# Patient Record
Sex: Female | Born: 1955 | Race: White | Hispanic: No | Marital: Married | Smoking: Former smoker
Health system: Southern US, Community
[De-identification: ages and names within clinical notes are randomized; demographics above are authoritative.]

## PROBLEM LIST (undated history)

## (undated) DIAGNOSIS — E669 Obesity, unspecified: Secondary | ICD-10-CM

## (undated) DIAGNOSIS — R519 Headache, unspecified: Secondary | ICD-10-CM

## (undated) DIAGNOSIS — Z9981 Dependence on supplemental oxygen: Secondary | ICD-10-CM

## (undated) DIAGNOSIS — F32A Depression, unspecified: Secondary | ICD-10-CM

## (undated) DIAGNOSIS — F329 Major depressive disorder, single episode, unspecified: Secondary | ICD-10-CM

## (undated) DIAGNOSIS — R51 Headache: Secondary | ICD-10-CM

## (undated) DIAGNOSIS — IMO0001 Reserved for inherently not codable concepts without codable children: Secondary | ICD-10-CM

## (undated) DIAGNOSIS — J449 Chronic obstructive pulmonary disease, unspecified: Secondary | ICD-10-CM

## (undated) DIAGNOSIS — K219 Gastro-esophageal reflux disease without esophagitis: Secondary | ICD-10-CM

## (undated) DIAGNOSIS — J45909 Unspecified asthma, uncomplicated: Secondary | ICD-10-CM

## (undated) DIAGNOSIS — F419 Anxiety disorder, unspecified: Secondary | ICD-10-CM

## (undated) DIAGNOSIS — G4730 Sleep apnea, unspecified: Secondary | ICD-10-CM

## (undated) DIAGNOSIS — G4700 Insomnia, unspecified: Secondary | ICD-10-CM

## (undated) HISTORY — DX: Insomnia, unspecified: G47.00

## (undated) HISTORY — PX: APPENDECTOMY: SHX54

## (undated) HISTORY — DX: Obesity, unspecified: E66.9

## (undated) HISTORY — PX: VAGINAL HYSTERECTOMY: SUR661

---

## 2006-08-01 ENCOUNTER — Ambulatory Visit: Payer: Self-pay | Admitting: Internal Medicine

## 2006-09-19 ENCOUNTER — Ambulatory Visit: Payer: Self-pay | Admitting: Internal Medicine

## 2006-09-20 ENCOUNTER — Ambulatory Visit: Payer: Self-pay | Admitting: Internal Medicine

## 2010-01-05 ENCOUNTER — Ambulatory Visit (HOSPITAL_COMMUNITY): Admission: RE | Admit: 2010-01-05 | Discharge: 2010-01-05 | Payer: Self-pay | Admitting: Surgery

## 2010-01-18 ENCOUNTER — Ambulatory Visit (HOSPITAL_COMMUNITY)
Admission: RE | Admit: 2010-01-18 | Discharge: 2010-01-18 | Payer: Self-pay | Source: Home / Self Care | Admitting: Surgery

## 2010-02-17 ENCOUNTER — Encounter: Admission: RE | Admit: 2010-02-17 | Discharge: 2010-02-17 | Payer: Self-pay | Admitting: Surgery

## 2010-03-02 ENCOUNTER — Ambulatory Visit (HOSPITAL_COMMUNITY): Admission: RE | Admit: 2010-03-02 | Discharge: 2010-03-02 | Payer: Self-pay | Admitting: Surgery

## 2011-03-25 NOTE — Assessment & Plan Note (Signed)
[facility]                               PULMONARY OFFICE NOTE   NAME:[name] [name], [name] [name]                     MRN:          [id number]  DATE:08/01/2006                            DOB:          [birth year]    REFERRING PHYSICIAN:  Raynelle Jan, M.D.   PULMONARY CONSULTATION:   CHIEF COMPLAINT:  Dyspnea.   HISTORY:  A 55 year old white female with onset 10 years ago of mild dyspnea  on exertion to the point where she can no longer walk on a flat surface for  more than 10 minutes or do light housework for no more than 5.  In addition  to the chronic complaints, she does have intermittent bronchitis typically  about every 3 months marked by increasing cough with thick white sputum  intermittently, and requires antibiotics for purulent sputum.  She sleeps on  2 pillows and is able to sleep through the night with no present  exacerbations with nocturnal wheezing or cough.  She does not remember  seeing any prednisone recently, but did receive Advair, which she says  didn't seem to help.   PAST MEDICAL HISTORY:  1. COPD.  2. Asthma.  3. Chronic headaches.  4. Sleep apnea.   ALLERGIES:  IBUPROFEN.   MEDICATIONS:  1. Hydrochlorothiazide.  2. Xopenex.  3. Spiriva.  4. Oxygen 2 liters per minute at bedtime.  (For full inventory, please see medication face sheet dated August 01, 2006).   SOCIAL HISTORY:  She continues to smoke after 35 years, up to 2-1/2 packs  per day.   FAMILY HISTORY:  Significant for emphysema in her mother.  Asthma also in  her mother.   REVIEW OF SYSTEMS:  Taken from the worksheet and significant for the  problems as outlined above.   PHYSICAL EXAMINATION:  GENERAL:  This is a slightly anxious and somewhat  depressed-appearing, ambulatory, obese white female, in no acute distress.  VITAL SIGNS:  Her weight is 245 pounds now, and states she has recently lost  50 pounds with no noticeable change in her complaints.   She has stable vital  signs.  HEENT:  Unremarkable.  Oropharynx is clear.  Nasal turbinates normal.  Ear  canals clear bilaterally.  NECK:  Supple without cervical adenopathy or tenderness.  Trachea is  midline.  LUNGS:  Lung fields reveal diminished breath sounds bilaterally.  No  wheezing.  HEART:  Regular rhythm without murmur, gallop, or rub.  S1 and S2 were  diminished.  ABDOMEN:  Massively obese but otherwise benign with no palpable  organomegaly, masses or tenderness, Hoover's sign was negative.  EXTREMITIES:  Warm without calf tenderness, cyanosis or clubbing.   CHEST X-RAY:  Chest x-ray today was normal.   IMPRESSION:  This patient predominantly appears to have morbid obesity with  a chronic obstructive pulmonary disease component that may be significant  but not truly limiting.  The only way to sort through it, given her body  habitus, is to proceed with a full set of PFT's, which I have asked her to  schedule.  In the meantime, I  note she does have Advair, and she is  beginning to have frequent exacerbations of cough or subjective wheezing.  I  think it is fine to start back on the Advair twice daily, but note  historically she did not do well with it.   At this point, I have also asked her to use the Xopenex q.4h. p.r.n. rather  than b.i.d., and Mucinex for cough.   I spent the most time talking to this patient about the natural history of  chronic obstructive pulmonary disease and the importance of smoking  cessation, rather than choice of doctors or inhalers, to stabilize her  airways disease.  I also emphasized a major challenge also faces her  regarding the issue of morbid obesity in terms of its impact on her overall  health in general, but in particular its effect on lung volume.                                   Charlaine Dalton. Sherene Sires, MD, [facility]   MBW/MedQ  DD:  08/01/2006  DT:  08/03/2006  Job #:  [id number]   cc:   Raynelle Jan, M.D.

## 2011-03-25 NOTE — Assessment & Plan Note (Signed)
[facility]                               PULMONARY OFFICE NOTE   NAME:[name], [name] KIMBELL                     MRN:          [id number]  DATE:09/20/2006                            DOB:          07/29/1956    PULMONARY/EXTENDED FOLLOW-UP OFFICE VISIT:   HISTORY:  A 55 year old white female, active smoking, with morbid obesity,  albeit with some progress in terms of weight control over the last several  years.  Still actively smoking and complaining of dyspnea if she mops more  than a few minutes.  She did report transient improvement after albuterol  but only for a few minutes, and we did document that after albuterol, her  PFTs are essentially normal.  She comes in discouraged today that she is no  better, mainly complaining of dyspnea with minimal activity, as noted  above, plus frequent flare-ups of what she describes as bronchitis.   Her medications at present are confusing, as that she was not forthcoming  about exactly what she takes.   PHYSICAL EXAMINATION:  GENERAL:  She is a depressed-appearing ambulatory  white female who clears her throat frequently during the exam.  NECK:  Supple without cervical adenopathy, tenderness.  Trachea is midline.  No thyromegaly.  LUNGS:  Perfectly clear bilaterally to auscultation and percussion.  Breath  sounds were slightly diminished.  HEART:  Regular rhythm without murmur, rub or gallop.  ABDOMEN:  Obese, benign.  EXTREMITIES:  Warm without calf tenderness, clubbing, cyanosis or edema.   Heme saturation 96% on room air.   IMPRESSION:  1. No evidence of chronic obstructive pulmonary disease.  After      bronchodilators, her FEV1 was 84% with a ratio of 78%.  This begs the      question, therefore, why she is so short of breath after she uses her      Xopenex inhaler, and I believe this is related to obesity and      deconditioning.  2. Frequent throat clearing, probably represents reflux and may  mimic      asthma, which she has, and chronic obstructive pulmonary disease, which      she does have.  To try to sort through the differential, I recommended      adding omeprazole each and every morning before breakfast and asking      her empirically to stop Spiriva and Advair, simply use Xopenex on a      p.r.n. basis (I documented that she could use this effectively).  Most      of the visit, however, was spent discussing the issue of smoking      cessation.  Without this, I believe she will have a progressive      increase in frequency of exacerbations and probably a decline in her      best FEV1 to the point where she actually does meet the criteria for      chronic obstructive pulmonary disease, which does not apply here.  I      tried to explain this all to her in a very straightforward, unambiguous  fashion. I am not sure if she really got the message though, and do not      plan to see her back here for the purpose of smoking cessation but      deferred this back to Dr. Joetta Manners.  I did emphasize that the best      way to do this is for her to set a quit day and then ask for help from      either Dr. Karl Bales office or consider being referred to our [facility] [employer]      program here (a behavioral psychology program, not part of the      pulmonary division).   There is data that would suggest patients with significant COPD with  frequent exacerbations benefit from Advair 250/50 b.i.d. on a maintenance  basis.  The patient told me today that she had not used Advair this way,  preferring a p.r.n.  Xopenex would be a better fit for her; however, if she  is having frequent exacerbations that would be worth trying, Advair with the  stipulation that it may not be very effective if she is actively smoking.     [name] [name]. [name] Sires, MD, [facility]  Electronically Signed    MBW/MedQ  DD: 09/20/2006  DT: 09/20/2006  Job #: [id number]   cc:   Raynelle Jan, M.D.

## 2012-02-01 ENCOUNTER — Emergency Department (HOSPITAL_COMMUNITY): Payer: Medicare Other

## 2012-02-01 ENCOUNTER — Other Ambulatory Visit: Payer: Self-pay

## 2012-02-01 ENCOUNTER — Encounter (HOSPITAL_COMMUNITY): Payer: Self-pay | Admitting: *Deleted

## 2012-02-01 ENCOUNTER — Inpatient Hospital Stay (HOSPITAL_COMMUNITY)
Admission: EM | Admit: 2012-02-01 | Discharge: 2012-02-05 | DRG: 190 | Disposition: A | Discharge status: Home / Self Care | Payer: Medicare Other | Attending: Internal Medicine | Admitting: Internal Medicine

## 2012-02-01 DIAGNOSIS — Z720 Tobacco use: Secondary | ICD-10-CM | POA: Diagnosis present

## 2012-02-01 DIAGNOSIS — Z79899 Other long term (current) drug therapy: Secondary | ICD-10-CM

## 2012-02-01 DIAGNOSIS — J441 Chronic obstructive pulmonary disease with (acute) exacerbation: Principal | ICD-10-CM | POA: Diagnosis present

## 2012-02-01 DIAGNOSIS — F17200 Nicotine dependence, unspecified, uncomplicated: Secondary | ICD-10-CM | POA: Diagnosis present

## 2012-02-01 DIAGNOSIS — J189 Pneumonia, unspecified organism: Secondary | ICD-10-CM | POA: Diagnosis present

## 2012-02-01 DIAGNOSIS — Z888 Allergy status to other drugs, medicaments and biological substances status: Secondary | ICD-10-CM

## 2012-02-01 DIAGNOSIS — J40 Bronchitis, not specified as acute or chronic: Secondary | ICD-10-CM | POA: Insufficient documentation

## 2012-02-01 DIAGNOSIS — Z23 Encounter for immunization: Secondary | ICD-10-CM

## 2012-02-01 DIAGNOSIS — K219 Gastro-esophageal reflux disease without esophagitis: Secondary | ICD-10-CM | POA: Diagnosis present

## 2012-02-01 DIAGNOSIS — IMO0002 Reserved for concepts with insufficient information to code with codable children: Secondary | ICD-10-CM

## 2012-02-01 DIAGNOSIS — Z9981 Dependence on supplemental oxygen: Secondary | ICD-10-CM

## 2012-02-01 HISTORY — DX: Gastro-esophageal reflux disease without esophagitis: K21.9

## 2012-02-01 HISTORY — DX: Chronic obstructive pulmonary disease, unspecified: J44.9

## 2012-02-01 LAB — BASIC METABOLIC PANEL
BUN: 8 mg/dL (ref 6–23)
Potassium: 3.5 mEq/L (ref 3.5–5.1)

## 2012-02-01 LAB — HEPATIC FUNCTION PANEL
AST: 11 U/L (ref 0–37)
Albumin: 3.6 g/dL (ref 3.5–5.2)
Total Protein: 7.5 g/dL (ref 6.0–8.3)

## 2012-02-01 LAB — DIFFERENTIAL
Basophils Relative: 0 % (ref 0–1)
Lymphocytes Relative: 13 % (ref 12–46)
Monocytes Absolute: 1 10*3/uL (ref 0.1–1.0)
Monocytes Relative: 6 % (ref 3–12)
Neutrophils Relative %: 81 % — ABNORMAL HIGH (ref 43–77)

## 2012-02-01 LAB — POCT I-STAT TROPONIN I

## 2012-02-01 LAB — CBC
HCT: 45.4 % (ref 36.0–46.0)
Hemoglobin: 15.5 g/dL — ABNORMAL HIGH (ref 12.0–15.0)
MCH: 29 pg (ref 26.0–34.0)
MCHC: 34.1 g/dL (ref 30.0–36.0)
Platelets: 169 10*3/uL (ref 150–400)
RBC: 5.34 MIL/uL — ABNORMAL HIGH (ref 3.87–5.11)
RDW: 14.6 % (ref 11.5–15.5)

## 2012-02-01 LAB — TSH: TSH: 1.314 u[IU]/mL (ref 0.350–4.500)

## 2012-02-01 LAB — EXPECTORATED SPUTUM ASSESSMENT W GRAM STAIN, RFLX TO RESP C

## 2012-02-01 MED ORDER — ALBUTEROL SULFATE (5 MG/ML) 0.5% IN NEBU
2.5000 mg | INHALATION_SOLUTION | RESPIRATORY_TRACT | Status: DC
  Administered 2012-02-01 – 2012-02-04 (×18): 2.5 mg via RESPIRATORY_TRACT
  Filled 2012-02-01 (×19): qty 0.5

## 2012-02-01 MED ORDER — ENOXAPARIN SODIUM 40 MG/0.4ML ~~LOC~~ SOLN
40.0000 mg | SUBCUTANEOUS | Status: DC
  Administered 2012-02-01 – 2012-02-04 (×4): 40 mg via SUBCUTANEOUS
  Filled 2012-02-01 (×5): qty 0.4

## 2012-02-01 MED ORDER — ONDANSETRON HCL 4 MG/2ML IJ SOLN
4.0000 mg | Freq: Four times a day (QID) | INTRAMUSCULAR | Status: DC | PRN
  Administered 2012-02-02: 4 mg via INTRAVENOUS
  Filled 2012-02-01: qty 2

## 2012-02-01 MED ORDER — DEXTROSE 5 % IV SOLN
1.0000 g | INTRAVENOUS | Status: DC
  Administered 2012-02-02 – 2012-02-05 (×4): 1 g via INTRAVENOUS
  Filled 2012-02-01 (×4): qty 10

## 2012-02-01 MED ORDER — SODIUM CHLORIDE 0.9 % IV SOLN
INTRAVENOUS | Status: DC
  Administered 2012-02-01 – 2012-02-02 (×2): via INTRAVENOUS

## 2012-02-01 MED ORDER — INFLUENZA VIRUS VACC SPLIT PF IM SUSP
0.5000 mL | INTRAMUSCULAR | Status: AC
  Administered 2012-02-02: 0.5 mL via INTRAMUSCULAR
  Filled 2012-02-01: qty 0.5

## 2012-02-01 MED ORDER — ONDANSETRON HCL 4 MG/2ML IJ SOLN
4.0000 mg | Freq: Once | INTRAMUSCULAR | Status: AC
  Administered 2012-02-01: 4 mg via INTRAVENOUS
  Filled 2012-02-01: qty 2

## 2012-02-01 MED ORDER — HYDROCOD POLST-CHLORPHEN POLST 10-8 MG/5ML PO LQCR
5.0000 mL | Freq: Two times a day (BID) | ORAL | Status: DC
  Administered 2012-02-01 – 2012-02-05 (×8): 5 mL via ORAL
  Filled 2012-02-01 (×8): qty 5

## 2012-02-01 MED ORDER — METHYLPREDNISOLONE SODIUM SUCC 40 MG IJ SOLR
40.0000 mg | Freq: Two times a day (BID) | INTRAMUSCULAR | Status: DC
  Administered 2012-02-01 – 2012-02-05 (×8): 40 mg via INTRAVENOUS
  Filled 2012-02-01 (×9): qty 1

## 2012-02-01 MED ORDER — ZOLPIDEM TARTRATE 5 MG PO TABS
5.0000 mg | ORAL_TABLET | Freq: Every evening | ORAL | Status: DC | PRN
  Administered 2012-02-02 – 2012-02-04 (×3): 5 mg via ORAL
  Filled 2012-02-01 (×3): qty 1

## 2012-02-01 MED ORDER — IPRATROPIUM BROMIDE 0.02 % IN SOLN
0.5000 mg | RESPIRATORY_TRACT | Status: DC
  Administered 2012-02-01 – 2012-02-04 (×18): 0.5 mg via RESPIRATORY_TRACT
  Filled 2012-02-01 (×19): qty 2.5

## 2012-02-01 MED ORDER — ACETAMINOPHEN 325 MG PO TABS: 650.0000 mg | ORAL_TABLET | Freq: Four times a day (QID) | ORAL | Status: DC | PRN

## 2012-02-01 MED ORDER — DEXTROSE 5 % IV SOLN
500.0000 mg | INTRAVENOUS | Status: DC
  Administered 2012-02-02 – 2012-02-04 (×3): 500 mg via INTRAVENOUS
  Filled 2012-02-01 (×3): qty 500

## 2012-02-01 MED ORDER — DEXTROSE 5 % IV SOLN
1.0000 g | Freq: Once | INTRAVENOUS | Status: AC
  Administered 2012-02-01: 1 g via INTRAVENOUS
  Filled 2012-02-01: qty 10

## 2012-02-01 MED ORDER — PNEUMOCOCCAL VAC POLYVALENT 25 MCG/0.5ML IJ INJ
0.5000 mL | INJECTION | INTRAMUSCULAR | Status: AC
  Administered 2012-02-02: 0.5 mL via INTRAMUSCULAR
  Filled 2012-02-01: qty 0.5

## 2012-02-01 MED ORDER — IPRATROPIUM BROMIDE 0.02 % IN SOLN
0.5000 mg | Freq: Once | RESPIRATORY_TRACT | Status: AC
  Administered 2012-02-01: 0.5 mg via RESPIRATORY_TRACT
  Filled 2012-02-01: qty 2.5

## 2012-02-01 MED ORDER — PREDNISONE 20 MG PO TABS
60.0000 mg | ORAL_TABLET | Freq: Once | ORAL | Status: AC
  Administered 2012-02-01: 60 mg via ORAL
  Filled 2012-02-01: qty 3

## 2012-02-01 MED ORDER — PANTOPRAZOLE SODIUM 40 MG PO TBEC
40.0000 mg | DELAYED_RELEASE_TABLET | Freq: Every day | ORAL | Status: DC
  Administered 2012-02-01 – 2012-02-05 (×5): 40 mg via ORAL
  Filled 2012-02-01 (×4): qty 1

## 2012-02-01 MED ORDER — ONDANSETRON HCL 4 MG PO TABS
4.0000 mg | ORAL_TABLET | Freq: Four times a day (QID) | ORAL | Status: DC | PRN
  Administered 2012-02-03: 4 mg via ORAL
  Filled 2012-02-01: qty 1

## 2012-02-01 MED ORDER — DEXTROSE 5 % IV SOLN
500.0000 mg | Freq: Once | INTRAVENOUS | Status: AC
  Administered 2012-02-01: 500 mg via INTRAVENOUS
  Filled 2012-02-01: qty 500

## 2012-02-01 MED ORDER — NICOTINE 21 MG/24HR TD PT24
21.0000 mg | MEDICATED_PATCH | Freq: Every day | TRANSDERMAL | Status: DC
  Administered 2012-02-01 – 2012-02-05 (×5): 21 mg via TRANSDERMAL
  Filled 2012-02-01 (×5): qty 1

## 2012-02-01 MED ORDER — ACETAMINOPHEN 650 MG RE SUPP: 650.0000 mg | Freq: Four times a day (QID) | RECTAL | Status: DC | PRN

## 2012-02-01 MED ORDER — ALBUTEROL SULFATE (5 MG/ML) 0.5% IN NEBU
5.0000 mg | INHALATION_SOLUTION | Freq: Once | RESPIRATORY_TRACT | Status: AC
  Administered 2012-02-01: 5 mg via RESPIRATORY_TRACT
  Filled 2012-02-01: qty 1

## 2012-02-01 NOTE — ED Notes (Signed)
Called floor with report Nurse assisting another patient will call back.

## 2012-02-01 NOTE — H&P (Signed)
Internal Medicine Attending Admission Note Date: 02/01/2012  Patient name: [name] [name] Medical record number: [id number] Date of birth: 1956-07-20 Age: 56 y.o. Gender: female  I saw and evaluated the patient. I reviewed the resident's note and I agree with the resident's findings and plan as documented in the resident's note.  Chief Complaint(s):  Shortness of breath and cough productive of yellow green sputum for 3 weeks.  History - key components related to admission:  [name] [name] is a 37 her old woman with a history of COPD and depression who presents with a three-week history of productive cough and progressive shortness of breath. She was diagnosed with COPD approximately 15 years ago. She has been managed with an as needed albuterol nebulizer. Her nebulizer recently broke and could not get it fixed or replaced. She recently moved to [place] and did not have a primary care provider. She therefore went to an urgent care center and was given an albuterol treatment, Advair to use when necessary, and prednisone burst with taper with an antibiotic. Her symptoms persisted and she returned to the urgent care Center and another course of prednisone burst with taper and antibiotics was given. Her symptoms failed to improve and over the last week she noted some blood specks in her sputum. She therefore decided to come to the [facility] emergency department for further evaluation. She denies any fevers but does admit to some recent shaking chills. These are associated with hot flashes. She denies chest pain but admits to 2 recent sick contacts in the last several weeks. While in the emergency department she was given an albuterol nebulizer treatment with marked improvement in her symptoms. Her only other complaint is right flank and back pain associated with her cough.  Physical Exam - key components related to admission:  Filed Vitals:   02/01/12 1415 02/01/12 1516 02/01/12 1519 02/01/12 1731    BP: 110/72 164/85    Pulse: 97 96    Temp:  98 F (36.7 C)    TempSrc:      Resp: 22 22    Height:  5\' 8"  (1.727 m)    Weight:  164 lb 1.6 oz (74.435 kg)    SpO2: 94% 94% 92% 92%   General: Well-developed, well-nourished, woman sitting comfortably in a chair in no acute distress. She is able to speak in full sentences without respiratory distress. Lungs: Diffuse expiratory wheezing with a markedly prolonged expiratory phase. There are no rales or crackles. Heart: Difficult to hear heart sounds secondary to her body habitus and wheezing. I believe she has a regular rate and rhythm without murmurs, rubs, or gallops appreciated. Abdomen: Soft, non tender, active bowel sounds. Extremities: Without edema.  Lab results:  Basic Metabolic Panel:  Basename 02/01/12 1600 02/01/12 1100  NA -- 135  K -- 3.5  CL -- 100  CO2 -- 23  GLUCOSE -- 103*  BUN -- 8  CREATININE -- 0.60  CALCIUM -- 9.2  MG 2.3 --  PHOS -- --   Liver Function Tests:  Basename 02/01/12 1600  AST 11  ALT 9  ALKPHOS 115  BILITOT 0.7  PROT 7.5  ALBUMIN 3.6   CBC:  Basename 02/01/12 1100  WBC 17.0*  NEUTROABS 13.8*  HGB 15.5*  HCT 45.4  MCV 85.0  PLT 169   Imaging results:  Dg Chest 2 View  02/01/2012  *RADIOLOGY REPORT*  Clinical Data: Shortness of breath, cough, right chest pain  CHEST - 2 VIEW  Comparison: None.  Findings: Cardiomediastinal silhouette is unremarkable.  No pulmonary edema.  There is hazy airspace disease in the right lower lobe with subtle increased bronchial markings.  This  is suspicious for early infiltrate/pneumonia.  Follow-up to resolution is recommended after treatment.  IMPRESSION:  No pulmonary edema.  There is hazy airspace disease in the right lower lobe with subtle increased bronchial markings.  This  is suspicious for early infiltrate/pneumonia.  Follow-up to resolution is recommended after treatment.  Original Report Authenticated By: Natasha Mead, M.D.   Assessment & Plan by  Problem:  [name] [name] is a 56 year old woman with a history of COPD who presents with progressive dyspnea over the last 3 weeks and productive cough which has not responded to prednisone and antibiotics. The symptoms began after exposure to sick contacts and were exacerbated by her lack of access to a short acting bronchodilators. When she receives her bronchodilators, her symptoms are quickly improved. I suspect she has a right basilar pneumonia that resulted in some mild blood flecks in her sputum and a COPD exacerbation.  1) COPD exacerbation: We will treat her possible right basilar pneumonia with IV antibiotics to cover a community acquired bacteria. We will also provide her with bronchodilators including albuterol and ipratropium by nebulization. She will be treated with a Solu-Medrol burst and converted to oral prednisone with a 2 week taper once feeling better. I anticipate she will be ready for discharge in the next 48-72 hours. Since she is new to [facility] she will require a primary care provider and we will set her up in the Internal Medicine Center.

## 2012-02-01 NOTE — ED Notes (Signed)
Patient transported to X-ray 

## 2012-02-01 NOTE — ED Provider Notes (Signed)
Medical screening examination/treatment/procedure(s) were performed by non-physician practitioner and as supervising physician I was immediately available for consultation/collaboration.   Chesnee Floren M Hubbard Seldon, DO 02/01/12 2155 

## 2012-02-01 NOTE — ED Notes (Signed)
Admitting MD at bedside.

## 2012-02-01 NOTE — ED Provider Notes (Signed)
History     CSN: [id number]  Arrival date & time 02/01/12  1001   First MD Initiated Contact with Patient 02/01/12 1013      Chief Complaint  Patient presents with  . Shortness of Breath    (Consider location/radiation/quality/duration/timing/severity/associated sxs/prior treatment) HPI History provided by pt.   Pt has had persistent cough productive of yellow-green sputum as well as SOB that is aggravated by coughing and exertion x 3 weeks.  Is now seeing streaks of blood in sputum and cough seems to be worse.  Denies CP but has bilateral, lateral lower rib pain.  No known fever.  H/o chronic bronchitis for which is on pm home O2.  Continues to smoke.  No known CAD.  No RF for PE and denies LE pain/edema.  Was seen at an urgent care at onset of sx, diagnosed w/ bronchitis and d/c'd home w/ prednisone and zpak.  Was compliant w/ medications but sx did not improve.  No relief w/ atrovent or albuterol inhaler either.    Past Medical History  Diagnosis Date  . COPD (chronic obstructive pulmonary disease)   . Asthma   . Bronchitis     Past Surgical History  Procedure Date  . Abdominal hysterectomy   . Appendectomy     No family history on file.  History  Substance Use Topics  . Smoking status: Current Everyday Smoker    Last Attempt to Quit: 01/11/2012  . Smokeless tobacco: Not on file  . Alcohol Use: No    OB History    Grav Para Term Preterm Abortions TAB SAB Ect Mult Living                  Review of Systems  All other systems reviewed and are negative.    Allergies  Ibuprofen  Home Medications   Current Outpatient Rx  Name Route Sig Dispense Refill  . FLUTICASONE-SALMETEROL 250-50 MCG/DOSE IN AEPB Inhalation Inhale 1-4 puffs into the lungs 2 (two) times daily as needed. For shortness of breath      BP 124/82  Pulse 109  Temp(Src) 98.5 F (36.9 C) (Oral)  Resp 24  SpO2 92%  Physical Exam  Nursing note and vitals reviewed. Constitutional: She is  oriented to person, place, and time. She appears well-developed and well-nourished. No distress.       Morbidly obese  HENT:  Head: Normocephalic and atraumatic.  Eyes:       Normal appearance  Neck: Normal range of motion.  Cardiovascular: Regular rhythm.        Mild tachycardia at 106bmp  Pulmonary/Chest: Effort normal and breath sounds normal. She has no rales.       Mild resp distress but able to complete sentences. O2 92-94% rm air. Productive coughing.  Diffuse expiratory wheezing.  Mild tenderness of right lateral lower rib cage.   Musculoskeletal: Normal range of motion.       No peripheral edema or calf ttp  Neurological: She is alert and oriented to person, place, and time.  Skin: Skin is warm and dry. No rash noted.  Psychiatric: She has a normal mood and affect. Her behavior is normal.    ED Course  Procedures (including critical care time)   Date: 02/01/2012  Rate: 103  Rhythm: sinus tachycardia  QRS Axis: normal  Intervals: normal  ST/T Wave abnormalities: normal  Conduction Disutrbances:none  Narrative Interpretation:   Old EKG Reviewed: unchanged   Labs Reviewed  CBC - Abnormal; Notable for the  following:    WBC 17.0 (*)    RBC 5.34 (*)    Hemoglobin 15.5 (*)    All other components within normal limits  DIFFERENTIAL - Abnormal; Notable for the following:    Neutrophils Relative 81 (*)    Neutro Abs 13.8 (*)    All other components within normal limits  BASIC METABOLIC PANEL - Abnormal; Notable for the following:    Glucose, Bld 103 (*)    All other components within normal limits  POCT I-STAT TROPONIN I  HEPATIC FUNCTION PANEL  MAGNESIUM  STREP PNEUMONIAE URINARY ANTIGEN  TSH  CULTURE, SPUTUM-ASSESSMENT  LEGIONELLA ANTIGEN, URINE  BASIC METABOLIC PANEL  CBC   Dg Chest 2 View  02/01/2012  *RADIOLOGY REPORT*  Clinical Data: Shortness of breath, cough, right chest pain  CHEST - 2 VIEW  Comparison: None.  Findings: Cardiomediastinal silhouette is  unremarkable.  No pulmonary edema.  There is hazy airspace disease in the right lower lobe with subtle increased bronchial markings.  This  is suspicious for early infiltrate/pneumonia.  Follow-up to resolution is recommended after treatment.  IMPRESSION:  No pulmonary edema.  There is hazy airspace disease in the right lower lobe with subtle increased bronchial markings.  This  is suspicious for early infiltrate/pneumonia.  Follow-up to resolution is recommended after treatment.  Original Report Authenticated By: Natasha Mead, M.D.     1. COPD exacerbation   2. Community acquired pneumonia       MDM  56yo F w/ h/o chronic bronchitis presents w/ 3wks productive cough and SOB that is gradually worsening.  On exam, afebrile, mild resp distress, mild hypoxia/tacycardia, coughing, diffuse exp wheezing, no LE pain/edema.  CXR pending.  Labs ordered as well because suspect admission based on initial exam.  Pt to received breathing tmnt and prednisone.  Will reasses shortly.   Labs sig for leukocytosis and neg troponin.  CXR shows hazy airspace dz right lower lobe, suspicious for early infiltrate.  Results discussed w/ pt.  She reports feeling better following breathing tmnt but continues to be tachpneic at 30-34 breaths/min and have diffuse expiratory wheezing.  Will admit for COPD exacerbation + CAP.  IV zithromax and rocephin have been ordered.    Outpatient Clinics consulted for admission.          Arie Sabina Dugger, [place] 02/01/12 2138

## 2012-02-01 NOTE — ED Notes (Signed)
Pt is here with shortness of breath for 3 weeks and has had pred pack and states this am started coughing up blood.  PT sts it is green and yellow sputum.  PT reports cold chills and sweating

## 2012-02-01 NOTE — ED Notes (Addendum)
C/o cold, prod cough, SOB with exertion x 3 weeks. Dx with bronchitis 1 week ago, just finished antibiotic & prednisone. Reports not getting any better. Continues with prod cough. Denies fever but does report hot & cold chills.   Reports this am noticed for first time specks of blood in sputum. C/o pain to bilateral rib area

## 2012-02-01 NOTE — H&P (Signed)
Hospital Admission Note Date: 02/01/2012  Patient name:  [name] [name]   Medical record number:  [id number] Date of birth:  12-Jul-1956  Age: 56 y.o. Gender:  female PCP:    No primary provider on file.  Medical Service:   Internal Medicine Teaching Service   Attending physician:  Dr.Klima First Contact:   Dr. Dierdre Searles      Pager: [phone] Second Contact:   Dr. Allena Katz           Pager: [phone] After Hours:    First Contact   Pager: [phone]      Second Contact  Pager: [phone]   Chief Complaint: Shortness of breath  History of Present Illness: Patient is a 56 y.o. female with a PMHx of COPD, who presents to [facility] for evaluation of shortness of breath been going on for past 3 weeks. Symptoms started with a dry cough. Her husband had the same symptoms at that time. It worsened progressively and become productive with brownish/green sputum. She started having shortness of breath throughout the day more so with mild exertion. She does not have any chest pain but does have back pain especially with coughing. She did not have any fever but had chills and night sweats since the onset of symptoms. No leg edema. In past couple of days she started seeing some streaks of blood in her sputum. She went to urgent care center but she was prescribed Z-Pak and 2 weeks of prednisone which did not help her symptoms. She had similar symptoms one year ago but not this severe. No other complaints like rash, lightheadedness, travel, change in medicines etc. she uses Advair for her COPD and does what she continue to use since the onset of symptoms. She does not use albuterol or any other inhalers. She is to have a nebulizer machine before but has not used it in 6 months.  Current Outpatient Medications: Advair  Allergies: Allergies  Allergen Reactions  . Ibuprofen Hives and Swelling     Past Medical History: Past Medical History  Diagnosis Date  . COPD (chronic obstructive pulmonary disease)     last exacerbation 1  year ago, treated as outpatient    Past Surgical History: Past Surgical History  Procedure Date  . Abdominal hysterectomy   . Appendectomy     Family History: Family History  Problem Relation Age of Onset  . Breast cancer Mother   . Breast cancer Maternal Grandmother     Social History: History   Social History  . Marital Status: Married    Spouse Name: N/A    Number of Children: N/A  . Years of Education: N/A   Occupational History  . Not on file.   Social History Main Topics  . Smoking status: Current Everyday Smoker    Last Attempt to Quit: 01/11/2012  . Smokeless tobacco: Not on file  . Alcohol Use: No  . Drug Use: No  . Sexually Active:    Other Topics Concern  . Not on file   Social History Narrative   lives in [place] with her husband. Has one son who is 46 years old. Used to work in [employer], on disability now. Has Medicare. Smokes 2 packs per day for past 40 years. Has not had a cigarette last 3 weeks since she got sick. Never drank alcohol. Never did  drugs.    Review of Systems: Constitutional:  Denies fever, chills, diaphoresis, appetite change and fatigue.  HEENT: Denies photophobia, eye pain, redness, hearing  loss, ear pain, congestion, sore throat, rhinorrhea, sneezing, mouth sores, trouble swallowing, neck pain, neck stiffness and tinnitus.  Respiratory:  as per history of present illness   Cardiovascular: Denies chest pain, palpitations and leg swelling.  Gastrointestinal: Denies nausea, vomiting, abdominal pain, diarrhea, constipation, blood in stool and abdominal distention.  Genitourinary: Denies dysuria, urgency, frequency, hematuria, flank pain and difficulty urinating.  Musculoskeletal: Denies myalgias, back pain, joint swelling, arthralgias and gait problem.   Skin: Denies pallor, rash and wound.  Neurological: Denies dizziness, seizures, syncope, weakness, light-headedness, numbness and headaches.   Hematological: Denies  adenopathy. Easy bruising, personal or family bleeding history.  Psychiatric/ Behavioral: Denies suicidal ideation, mood changes, confusion, nervousness, sleep disturbance and agitation.    Vital Signs:  Filed Vitals:   02/01/12 1333  BP: 120/78  Pulse: 97  Temp:  98.9   O2 saturation   97% on room   Resp: 28     Physical Exam: General:  obese, sitting up in bed , mild to moderate respiratory distress , able to speak in full sentences with occasional coughing   Head: Normocephalic, atraumatic.  Eyes: PERRL, EOMI, No signs of anemia or jaundince.  Nose: Mucous membranes  dry, not inflammed, nonerythematous.  Throat: Oropharynx nonerythematous, no exudate appreciated.   Neck: No deformities, masses, or tenderness noted.Supple, No carotid Bruits, no JVD.  Lungs:  Normal respiratory effort.  bilateral diffuse wheezes no crackles .  Heart: RRR. S1 and S2 normal without gallop, murmur, or rubs.  Abdomen:  BS normoactive. Soft, Nondistended, non-tender.  No masses or organomegaly.  Extremities: No pretibial edema.  Neurologic: A&O X3, CN II - XII are grossly intact. Motor strength is 5/5 in the all 4 extremities, Sensations intact to light touch, Cerebellar signs negative.  Skin: No visible rashes, scars.   Lab results: Basic Metabolic Panel: Recent Labs  Basename 02/01/12 1100   NA 135   K 3.5   CL 100   CO2 23   GLUCOSE 103*   BUN 8   CREATININE 0.60   CALCIUM 9.2   MG --   PHOS --   CBC: Recent Labs  Basename 02/01/12 1100   WBC 17.0*   NEUTROABS 13.8*   HGB 15.5*   HCT 45.4   MCV 85.0   PLT 169    Imaging results:  Dg Chest 2 View  02/01/2012  *RADIOLOGY REPORT*  Clinical Data: Shortness of breath, cough, right chest pain  CHEST - 2 VIEW  Comparison: None.  Findings: Cardiomediastinal silhouette is unremarkable.  No pulmonary edema.  There is hazy airspace disease in the right lower lobe with subtle increased bronchial markings.  This  is suspicious for early  infiltrate/pneumonia.  Follow-up to resolution is recommended after treatment.  IMPRESSION:  No pulmonary edema.  There is hazy airspace disease in the right lower lobe with subtle increased bronchial markings.  This  is suspicious for early infiltrate/pneumonia.  Follow-up to resolution is recommended after treatment.  Original Report Authenticated By: Natasha Mead, M.D.     Other results: EKG: Normal sinus rhythm, normal axis, Mild left atrial enlargement, nonspecific ST-T wave changes in anterior leads    Assessment & Plan: 56 year old woman with past medical history of COPD comes in to the emergency department with shortness of breath for past 3 weeks the diffuse wheezing on physical exam. She is clinically stable and her vitals look good but she is in respiratory distress. Chest x-ray shows possible pneumonia she has elevated white count but she  has been on prednisone for past 2 weeks    #1 shortness of breath - Most likely COPD exacerbation. Other factors that may be contributing to her shortness of breath are right-sided heart failure, pulmonary hypertension, obstructive sleep apnea and pneumonia - Given the history ,physical exam, lab values and x-ray findings we'll treat her with COPD exacerbation  Plan - Admit to MedSurg unit - Solu-Medrol 40 mg IV twice a day - Rocephin and Zithromax IV - Albuterol/ipratropium nebulizer every 4 hours - Normal saline at 100 cc per hour - Check ABG - Oxygen supplementation for situation more than 88% - Repeat chest x-ray for improvement by tomorrow morning  #2 smoking - Cessation counseling - Nicotine patches  #3 DVT PPX -Lovenox     Bethel Born, M.D. (PGY3):  ____________________________________    Date/ Time:     ____________________________________

## 2012-02-01 NOTE — ED Notes (Signed)
Informed patient and/or family of status.  

## 2012-02-02 LAB — BASIC METABOLIC PANEL
BUN: 11 mg/dL (ref 6–23)
Chloride: 102 mEq/L (ref 96–112)
GFR calc Af Amer: >90 mL/min (ref >90)
Glucose, Bld: 154 mg/dL — ABNORMAL HIGH (ref 70–99)
Potassium: 3.8 mEq/L (ref 3.5–5.1)

## 2012-02-02 LAB — CBC
HCT: 40.5 % (ref 36.0–46.0)
Hemoglobin: 13.3 g/dL (ref 12.0–15.0)
MCH: 28.2 pg (ref 26.0–34.0)
MCHC: 32.8 g/dL (ref 30.0–36.0)
RBC: 4.71 MIL/uL (ref 3.87–5.11)

## 2012-02-02 MED ORDER — ALBUTEROL SULFATE (5 MG/ML) 0.5% IN NEBU: 2.5000 mg | INHALATION_SOLUTION | RESPIRATORY_TRACT | Status: DC

## 2012-02-02 MED ORDER — GUAIFENESIN-DM 100-10 MG/5ML PO SYRP
5.0000 mL | ORAL_SOLUTION | ORAL | Status: DC | PRN
  Administered 2012-02-03: 5 mL via ORAL
  Filled 2012-02-02: qty 5

## 2012-02-02 MED ORDER — TRAMADOL HCL 50 MG PO TABS
50.0000 mg | ORAL_TABLET | Freq: Four times a day (QID) | ORAL | Status: DC | PRN
  Filled 2012-02-02: qty 1

## 2012-02-02 MED ORDER — IPRATROPIUM BROMIDE 0.02 % IN SOLN: 0.5000 mg | RESPIRATORY_TRACT | Status: DC | PRN

## 2012-02-02 NOTE — Progress Notes (Signed)
Internal Medicine Attending  Date: 02/02/2012  Patient name: [name] [name] Medical record number: [id number] Date of birth: 1956/05/13 Age: 56 y.o. Gender: female  I saw and evaluated the patient. I reviewed the resident's note by Dr. Dierdre Searles and I agree with the resident's findings and plans as documented in her progress note.  [name] notes minimal improvement in her breathing despite the Solu-Medrol, antibiotics, and bronchodilators. She does state that when she receives her albuterol she feels and breathes much better but this is a transient improvement. Examination continues to reveal bronchial breath sounds in the right base posteriorly consistent with a pneumonic process. We will continue the steroids and bronchodilators for COPD exacerbation and antibiotics for the pneumonia.

## 2012-02-02 NOTE — Evaluation (Signed)
Physical Therapy Evaluation Patient Details Name: [name] [name] MRN: [id number] DOB: May 04, 1956 Today's Date: 02/02/2012  Problem List:  Patient Active Problem List  Diagnoses  . Bronchitis    Past Medical History:  Past Medical History  Diagnosis Date  . COPD (chronic obstructive pulmonary disease)     last exacerbation 1 year ago, treated as outpatient  . Shortness of breath   . Recurrent upper respiratory infection (URI)   . Pneumonia   . GERD (gastroesophageal reflux disease)    Past Surgical History:  Past Surgical History  Procedure Date  . Abdominal hysterectomy   . Appendectomy     PT Assessment/Plan/Recommendation PT Assessment Clinical Impression Statement: Pt currently moving at baseline level after COPD exacerbation coupled with PNA. Pt educated for energy conservation and able to verbalize education. Pt dropped to 88% on RA with ambulation but maintained 90-92% on 2L with ambulation. Pt encouraged to continue ambulating acutely. No further therapy needs, pt agreeable.  PT Recommendation/Assessment: Patient does not need any further PT services Barriers to Discharge: None No Skilled PT: Patient at baseline level of functioning;Patient is independent with all acitivity/mobility PT Recommendation Follow Up Recommendations: No PT follow up Equipment Recommended: None recommended by PT PT Goals     PT Evaluation Precautions/Restrictions  Precautions Precaution Comments: O2 Prior Functioning  Home Living Lives With: Spouse Type of Home: House Home Layout: One level Home Access: Stairs to enter [employer] of Steps: 3 Bathroom Shower/Tub: Forensic scientist: Standard Home Adaptive Equipment: None Prior Function Level of Independence: Independent with basic ADLs;Independent with transfers;Independent with homemaking with ambulation;Independent with gait Driving: No Vocation:  Unemployed Financial risk analyst Arousal/Alertness: Awake/alert Overall Cognitive Status: Appears within functional limits for tasks assessed Sensation/Coordination Sensation Light Touch: Appears Intact Extremity Assessment RLE Assessment RLE Assessment: Within Functional Limits LLE Assessment LLE Assessment: Within Functional Limits Mobility (including Balance) Bed Mobility Bed Mobility: Yes Supine to Sit: 6: Modified independent (Device/Increase time) Transfers Transfers: Yes Sit to Stand: 7: Independent Stand to Sit: 7: Independent Ambulation/Gait Ambulation/Gait: Yes Ambulation/Gait Assistance: 7: Independent Ambulation Distance (Feet): 600 Feet Assistive device: None Gait Pattern: Within Functional Limits Stairs: No  Posture/Postural Control Posture/Postural Control: No significant limitations Exercise    End of Session PT - End of Session Activity Tolerance: Patient tolerated treatment well Patient left: in chair;with call bell in reach General Behavior During Session: [facility] for tasks performed Cognition: [facility] for tasks performed  Delorse Lek 02/02/2012, 4:59 PM  Delaney Meigs, PT [phone]

## 2012-02-02 NOTE — Progress Notes (Signed)
02-02-12 UR completed. Catherina Pates RN BSN  

## 2012-02-02 NOTE — Progress Notes (Signed)
Subjective: Patient states that she still feels shortness breath, productive cough and wheezing which are slightly better after her Neb treatment. No acute events overnight    Objective: Vital signs in last 24 hours: Filed Vitals:   02/02/12 0405 02/02/12 0503 02/02/12 0926 02/02/12 1245  BP:  118/78    Pulse:  86    Temp:  97.6 F (36.4 C)    TempSrc:  Oral    Resp:  22    Height:      Weight:      SpO2: 95% 94% 89% 92%   Weight change:   Intake/Output Summary (Last 24 hours) at 02/02/12 1247 Last data filed at 02/02/12 0900  Gross per 24 hour  Intake 2106.67 ml  Output      0 ml  Net 2106.67 ml   Physical Exam: General: Mild distress due to SOB and wheezing.  But able to speak full sentences Neck: No JVD, No carotid bruits Lungs: Normal respiratory effort. bilateral diffuse wheezes slightly better than yesterday. no crackles  Heart: RRR, no M/G/R Abd: soft, BS x 4  Ext: No edema  Lab Results: Basic Metabolic Panel:  Lab 02/02/12 1610 02/01/12 1600 02/01/12 1100  Runa Wh[place]ham 136 -- 135  K 3.8 -- 3.5  CL 102 -- 100  CO2 23 -- 23  GLUCOSE 154* -- 103*  BUN 11 -- 8  CREATININE 0.60 -- 0.60  CALCIUM 9.2 -- 9.2  MG -- 2.3 --  PHOS -- -- --   Liver Function Tests:  Lab 02/01/12 1600  AST 11  ALT 9  ALKPHOS 115  BILITOT 0.7  PROT 7.5  ALBUMIN 3.6   CBC:  Lab 02/02/12 0611 02/01/12 1100  WBC 13.2* 17.0*  NEUTROABS -- 13.8*  HGB 13.3 15.5*  HCT 40.5 45.4  MCV 86.0 85.0  PLT 164 169   Thyroid Function Tests:  Lab 02/01/12 1600  TSH 1.314  T4TOTAL --  FREET4 --  T3FREE --  THYROIDAB --    Micro Results: Recent Results (from the past 240 hour(s))  CULTURE, SPUTUM-ASSESSMENT     Status: Normal   Collection Time   02/01/12  5:40 PM      Component Value Range Status Comment   Specimen Description SPUTUM   Final    Special Requests NONE   Final    Sputum evaluation     Final    Value: THIS SPECIMEN IS ACCEPTABLE. RESPIRATORY CULTURE REPORT TO FOLLOW.     Report Status 02/01/2012 FINAL   Final   CULTURE, RESPIRATORY     Status: Normal (Preliminary result)   Collection Time   02/01/12  5:40 PM      Component Value Range Status Comment   Specimen Description SPUTUM   Final    Special Requests NONE   Final    Gram Stain     Final    Value: FEW WBC PRESENT,BOTH PMN AND MONONUCLEAR     NO SQUAMOUS EPITHELIAL CELLS SEEN     NO ORGANISMS SEEN   Culture PENDING   Incomplete    Report Status PENDING   Incomplete    Studies/Results: Dg Chest 2 View  02/01/2012  *RADIOLOGY REPORT*  Clinical Data: Shortness of breath, cough, right chest pain  CHEST - 2 VIEW  Comparison: None.  Findings: Cardiomediastinal silhouette is unremarkable.  No pulmonary edema.  There is hazy airspace disease in the right lower lobe with subtle increased bronchial markings.  This  is suspicious for early infiltrate/pneumonia.  Follow-up  to resolution is recommended after treatment.  IMPRESSION:  No pulmonary edema.  There is hazy airspace disease in the right lower lobe with subtle increased bronchial markings.  This  is suspicious for early infiltrate/pneumonia.  Follow-up to resolution is recommended after treatment.  Original Report Authenticated By: Natasha Mead, M.D.   Medications: I have reviewed the patient's current medications. Scheduled Meds:   . ipratropium  0.5 mg Nebulization Q4H   And  . albuterol  2.5 mg Nebulization Q4H  . azithromycin  500 mg Intravenous Once  . azithromycin  500 mg Intravenous Q24H  . cefTRIAXone (ROCEPHIN)  IV  1 g Intravenous Q24H  . chlorpheniramine-HYDROcodone  5 mL Oral Q12H  . enoxaparin  40 mg Subcutaneous Q24H  . influenza  inactive virus vaccine  0.5 mL Intramuscular Tomorrow-1000  . methylPREDNISolone (SOLU-MEDROL) injection  40 mg Intravenous Q12H  . nicotine  21 mg Transdermal Daily  . pantoprazole  40 mg Oral Q1200  . pneumococcal 23 valent vaccine  0.5 mL Intramuscular Tomorrow-1000   Continuous Infusions:   . DISCONTD:  sodium chloride 100 mL/hr at 02/02/12 0642   PRN Meds:.acetaminophen, acetaminophen, ondansetron (ZOFRAN) IV, ondansetron, traMADol, zolpidem Assessment/Plan:  #1.  COPD exacerbation with possible RLL CAP.       Plan  -Oxygen therapy - Steroids >>>Solu-Medrol 40 mg IV twice a day  - Antibiotics for CAP>>>Rocephin and Zithromax IV  - BD Neb>>>Albuterol/ipratropium nebulizer every 4 hours  - PT eval  - Case management to arrange nebulizer equipment and oxygen tank -Patient to followup with [facility] after discharge (no PCP)  # GERD    Protonix  #2 Tobacco abuse - Cessation counseling  - Nicotine patches   #3 DVT PPX -Lovenox       LOS: 1 day   Myrella Fahs 02/02/2012, 12:47 PM

## 2012-02-03 LAB — GLUCOSE, CAPILLARY: Glucose-Capillary: 100 mg/dL — ABNORMAL HIGH (ref 70–99)

## 2012-02-03 MED ORDER — WHITE PETROLATUM GEL
Status: AC
  Administered 2012-02-03: 19:00:00
  Filled 2012-02-03: qty 5

## 2012-02-03 NOTE — Progress Notes (Addendum)
Met with pt on 02/02/2012 to discuss d/c needs, Pt has oxygen concentrator which she purchases some years ago from [place], has no tanks as she has had no recent needs. Pt had nebulizer which was broken some time ago. Have ask [facility] to provide pt with another nebulizer. Call placed to [facility] re oxygen tanks, await return call. If pt has to use another company for oxygen she will have to get a new concentrator in order to get the tanks. She would rent the concentrator, or her insurance may cover about 80%.  Johny Shock RN MPH Case Manager Received return call from [facility] re oxygen tanks. They are unable to provide tanks alone, pt would have to privately pay  for tanks from any agency. The only way to get tanks is to get the concentrator as well. Will discuss with pt so she can select provider.  Johny Shock RN MPH Case Manager [phone]

## 2012-02-03 NOTE — Progress Notes (Addendum)
Subjective: Patient states that she feels a little better today. Less cough and lesser SOB and able to sleep some last night. No acute events overnight.  Objective: Vital signs in last 24 hours: Filed Vitals:   02/02/12 2148 02/02/12 2340 02/03/12 0422 02/03/12 0508  BP: 111/74   112/70  Pulse: 82   85  Temp: 98.9 F (37.2 C)   97.9 F (36.6 C)  TempSrc: Oral   Oral  Resp: 20   24  Height:      Weight:      SpO2: 94% 94% 96% 91%   Weight change:   Intake/Output Summary (Last 24 hours) at 02/03/12 0752 Last data filed at 02/03/12 0618  Gross per 24 hour  Intake    835 ml  Output      0 ml  Net    835 ml   Physical Exam: General: NAD.   Neck: No JVD, No carotid bruits  Lungs: Normal respiratory effort. Bilateral moderate exp. wheezes better than yesterday. no crackles  Heart: RRR, no M/G/R  Abd: soft, BS x 4  Ext: No edema  Lab Results: Basic Metabolic Panel:  Lab 02/02/12 4132 02/01/12 1600 02/01/12 1100  [name] [name] 136 -- 135  K 3.8 -- 3.5  CL 102 -- 100  CO2 23 -- 23  GLUCOSE 154* -- 103*  BUN 11 -- 8  CREATININE 0.60 -- 0.60  CALCIUM 9.2 -- 9.2  MG -- 2.3 --  PHOS -- -- --   Liver Function Tests:  Lab 02/01/12 1600  AST 11  ALT 9  ALKPHOS 115  BILITOT 0.7  PROT 7.5  ALBUMIN 3.6   CBC:  Lab 02/02/12 0611 02/01/12 1100  WBC 13.2* 17.0*  NEUTROABS -- 13.8*  HGB 13.3 15.5*  HCT 40.5 45.4  MCV 86.0 85.0  PLT 164 169   Thyroid Function Tests:  Lab 02/01/12 1600  TSH 1.314  T4TOTAL --  FREET4 --  T3FREE --  THYROIDAB --    Micro Results: Recent Results (from the past 240 hour(s))  CULTURE, SPUTUM-ASSESSMENT     Status: Normal   Collection Time   02/01/12  5:40 PM      Component Value Range Status Comment   Specimen Description SPUTUM   Final    Special Requests NONE   Final    Sputum evaluation     Final    Value: THIS SPECIMEN IS ACCEPTABLE. RESPIRATORY CULTURE REPORT TO FOLLOW.   Report Status 02/01/2012 FINAL   Final   CULTURE,  RESPIRATORY     Status: Normal (Preliminary result)   Collection Time   02/01/12  5:40 PM      Component Value Range Status Comment   Specimen Description SPUTUM   Final    Special Requests NONE   Final    Gram Stain     Final    Value: FEW WBC PRESENT,BOTH PMN AND MONONUCLEAR     NO SQUAMOUS EPITHELIAL CELLS SEEN     NO ORGANISMS SEEN   Culture PENDING   Incomplete    Report Status PENDING   Incomplete    Studies/Results: Dg Chest 2 View  02/01/2012  *RADIOLOGY REPORT*  Clinical Data: Shortness of breath, cough, right chest pain  CHEST - 2 VIEW  Comparison: None.  Findings: Cardiomediastinal silhouette is unremarkable.  No pulmonary edema.  There is hazy airspace disease in the right lower lobe with subtle increased bronchial markings.  This  is suspicious for early infiltrate/pneumonia.  Follow-up to resolution  is recommended after treatment.  IMPRESSION:  No pulmonary edema.  There is hazy airspace disease in the right lower lobe with subtle increased bronchial markings.  This  is suspicious for early infiltrate/pneumonia.  Follow-up to resolution is recommended after treatment.  Original Report Authenticated By: Natasha Mead, M.D.   Medications: I have reviewed the patient's current medications. Scheduled Meds:   . ipratropium  0.5 mg Nebulization Q4H   And  . albuterol  2.5 mg Nebulization Q4H  . azithromycin  500 mg Intravenous Q24H  . cefTRIAXone (ROCEPHIN)  IV  1 g Intravenous Q24H  . chlorpheniramine-HYDROcodone  5 mL Oral Q12H  . enoxaparin  40 mg Subcutaneous Q24H  . influenza  inactive virus vaccine  0.5 mL Intramuscular Tomorrow-1000  . methylPREDNISolone (SOLU-MEDROL) injection  40 mg Intravenous Q12H  . nicotine  21 mg Transdermal Daily  . pantoprazole  40 mg Oral Q1200  . pneumococcal 23 valent vaccine  0.5 mL Intramuscular Tomorrow-1000  . DISCONTD: albuterol  2.5 mg Nebulization Q4H   Continuous Infusions:   . DISCONTD: sodium chloride 100 mL/hr at 02/02/12 0642    PRN Meds:.acetaminophen, acetaminophen, guaiFENesin-dextromethorphan, ipratropium, ondansetron (ZOFRAN) IV, ondansetron, traMADol, zolpidem Assessment/Plan:  #1. COPD exacerbation with RLL CAP, improving  Plan  -Oxygen therapy  - Steroids >>>Solu-Medrol 40 mg IV twice a day  - Antibiotics for CAP>>>Rocephin and Zithromax IV  - BD Neb>>>Albuterol/ipratropium nebulizer every 4 hours  - PT eval  - Case management to arrange nebulizer equipment and oxygen tank for home. - Patient to followup with [facility] clinic after discharge (no PCP)   # GERD  Protonix   # Tobacco abuse  - Cessation counseling  - Nicotine patches   # DVT PPX -Lovenox        LOS: 2 days   [name] [name] 02/03/2012, 7:52 AM

## 2012-02-03 NOTE — Progress Notes (Signed)
Pt selected [facility] for home oxygen, concentrator and tanks. MD please order oxygen sats to qualify for home oxygen. Johny Shock RN MPH [phone]

## 2012-02-04 LAB — BASIC METABOLIC PANEL
BUN: 10 mg/dL (ref 6–23)
CO2: 26 mEq/L (ref 19–32)
Calcium: 9.1 mg/dL (ref 8.4–10.5)
Chloride: 103 mEq/L (ref 96–112)
Creatinine, Ser: 0.52 mg/dL (ref 0.50–1.10)
GFR calc Af Amer: >90 mL/min (ref >90)
GFR calc non Af Amer: >90 mL/min (ref >90)
Glucose, Bld: 127 mg/dL — ABNORMAL HIGH (ref 70–99)
Potassium: 4.1 mEq/L (ref 3.5–5.1)
Sodium: 140 mEq/L (ref 135–145)

## 2012-02-04 LAB — CBC
HCT: 41.3 % (ref 36.0–46.0)
RDW: 15.1 % (ref 11.5–15.5)
WBC: 10.1 10*3/uL (ref 4.0–10.5)

## 2012-02-04 LAB — GLUCOSE, CAPILLARY
Glucose-Capillary: 128 mg/dL — ABNORMAL HIGH (ref 70–99)
Glucose-Capillary: 108 mg/dL — ABNORMAL HIGH (ref 70–99)

## 2012-02-04 MED ORDER — ALBUTEROL SULFATE (5 MG/ML) 0.5% IN NEBU: 2.5000 mg | INHALATION_SOLUTION | RESPIRATORY_TRACT | Status: DC

## 2012-02-04 MED ORDER — ALBUTEROL SULFATE (5 MG/ML) 0.5% IN NEBU: 2.5000 mg | INHALATION_SOLUTION | RESPIRATORY_TRACT | Status: DC | PRN

## 2012-02-04 MED ORDER — IPRATROPIUM BROMIDE 0.02 % IN SOLN: 0.5000 mg | Freq: Four times a day (QID) | RESPIRATORY_TRACT | Status: DC

## 2012-02-04 MED ORDER — ALBUTEROL SULFATE (5 MG/ML) 0.5% IN NEBU
2.5000 mg | INHALATION_SOLUTION | Freq: Four times a day (QID) | RESPIRATORY_TRACT | Status: DC
  Administered 2012-02-04 – 2012-02-05 (×3): 2.5 mg via RESPIRATORY_TRACT
  Filled 2012-02-04 (×5): qty 0.5

## 2012-02-04 MED ORDER — AZITHROMYCIN 500 MG PO TABS
500.0000 mg | ORAL_TABLET | Freq: Every day | ORAL | Status: DC
Start: 2012-02-05 — End: 2012-02-05
  Administered 2012-02-05: 500 mg via ORAL
  Filled 2012-02-04: qty 1

## 2012-02-04 MED ORDER — IPRATROPIUM BROMIDE 0.02 % IN SOLN
0.5000 mg | Freq: Four times a day (QID) | RESPIRATORY_TRACT | Status: DC
  Administered 2012-02-04 – 2012-02-05 (×3): 0.5 mg via RESPIRATORY_TRACT
  Filled 2012-02-04 (×5): qty 2.5

## 2012-02-04 NOTE — Progress Notes (Signed)
Subjective: Patient states that she feels much  better today. Less cough and lesser SOB and able to sleep some last night. No acute events overnight.  Gets short of breath while talking today during our conversation. Feels better in chair compared to lying in bed. Denies any nausea vomiting, chest pain. Objective: Vital signs in last 24 hours: Filed Vitals:   02/04/12 0416 02/04/12 0651 02/04/12 0818 02/04/12 1226  BP:  118/75    Pulse:  73    Temp:  97.8 F (36.6 C)    TempSrc:      Resp:  20    Height:      Weight:      SpO2: 98% 95% 94% 89%   Weight change:   Intake/Output Summary (Last 24 hours) at 02/04/12 1329 Last data filed at 02/03/12 1846  Gross per 24 hour  Intake   1200 ml  Output      0 ml  Net   1200 ml   Physical Exam: General: NAD.   Neck: No JVD, No carotid bruits  Lungs: Normal respiratory effort. Mild anterior expiratory wheezes bilaterally. no crackles  Heart: RRR, no M/G/R  Abd: soft, BS x 4  Ext: No edema  Lab Results: Basic Metabolic Panel:  Lab 02/04/12 1610 02/02/12 0611 02/01/12 1600  NA 140 136 --  K 4.1 3.8 --  CL 103 102 --  CO2 26 23 --  GLUCOSE 127* 154* --  BUN 10 11 --  CREATININE 0.52 0.60 --  CALCIUM 9.1 9.2 --  MG -- -- 2.3  PHOS -- -- --   Liver Function Tests:  Lab 02/01/12 1600  AST 11  ALT 9  ALKPHOS 115  BILITOT 0.7  PROT 7.5  ALBUMIN 3.6   CBC:  Lab 02/04/12 0630 02/02/12 0611 02/01/12 1100  WBC 10.1 13.2* --  NEUTROABS -- -- 13.8*  HGB 13.5 13.3 --  HCT 41.3 40.5 --  MCV 86.8 86.0 --  PLT 206 164 --   Thyroid Function Tests:  Lab 02/01/12 1600  TSH 1.314  T4TOTAL --  FREET4 --  T3FREE --  THYROIDAB --    Micro Results: Recent Results (from the past 240 hour(s))  CULTURE, SPUTUM-ASSESSMENT     Status: Normal   Collection Time   02/01/12  5:40 PM      Component Value Range Status Comment   Specimen Description SPUTUM   Final    Special Requests NONE   Final    Sputum evaluation     Final     Value: THIS SPECIMEN IS ACCEPTABLE. RESPIRATORY CULTURE REPORT TO FOLLOW.   Report Status 02/01/2012 FINAL   Final   CULTURE, RESPIRATORY     Status: Normal (Preliminary result)   Collection Time   02/01/12  5:40 PM      Component Value Range Status Comment   Specimen Description SPUTUM   Final    Special Requests NONE   Final    Gram Stain     Final    Value: FEW WBC PRESENT,BOTH PMN AND MONONUCLEAR     NO SQUAMOUS EPITHELIAL CELLS SEEN     NO ORGANISMS SEEN   Culture Culture reincubated for better growth   Final    Report Status PENDING   Incomplete    Studies/Results: No results found. Medications: I have reviewed the patient's current medications. Scheduled Meds:    . albuterol  2.5 mg Nebulization Q6H   And  . ipratropium  0.5 mg Nebulization Q6H  .  azithromycin  500 mg Intravenous Q24H  . cefTRIAXone (ROCEPHIN)  IV  1 g Intravenous Q24H  . chlorpheniramine-HYDROcodone  5 mL Oral Q12H  . enoxaparin  40 mg Subcutaneous Q24H  . methylPREDNISolone (SOLU-MEDROL) injection  40 mg Intravenous Q12H  . nicotine  21 mg Transdermal Daily  . pantoprazole  40 mg Oral Q1200  . white petrolatum      . DISCONTD: albuterol  2.5 mg Nebulization Q4H  . DISCONTD: albuterol  2.5 mg Nebulization Q4H  . DISCONTD: ipratropium  0.5 mg Nebulization Q4H  . DISCONTD: ipratropium  0.5 mg Nebulization Q6H   Continuous Infusions:  PRN Meds:.acetaminophen, acetaminophen, albuterol, guaiFENesin-dextromethorphan, ondansetron (ZOFRAN) IV, ondansetron, traMADol, zolpidem, DISCONTD: ipratropium Assessment/Plan:  #1. COPD exacerbation with RLL CAP, improving  Plan  -Oxygen therapy  - Steroids >>>Solu-Medrol 40 mg IV twice a day  - Antibiotics for CAP>>>Rocephin and Zithromax IV  - BD Neb>>>Albuterol/ipratropium nebulizer every 4 hours  - PT eval  - Case management to arrange nebulizer equipment and oxygen tank for home. - Patient to followup with [facility] clinic after discharge (no PCP)  -Likely DC  home tomorrow with possible home O2 along with total 10 days of antibiotics and steroid taper. - Ampullary O2 sats to be done today to decide if she needs home O2.  # GERD  Protonix   # Tobacco abuse  - Cessation counseling  - Nicotine patches   # DVT PPX -Lovenox        LOS: 3 days   Atha Muradyan 02/04/2012, 1:29 PM

## 2012-02-04 NOTE — Progress Notes (Signed)
PHARMACIST - PHYSICIAN COMMUNICATION DR:   Allena Katz CONCERNING: Antibiotic IV to Oral Route Change Policy  RECOMMENDATION: This patient is receiving Azithromycin by the intravenous route.  Based on criteria approved by the Pharmacy and Therapeutics Committee, the antibiotic(s) is/are being converted to the equivalent oral dose form(s).   DESCRIPTION: These criteria include:  Patient being treated for a respiratory tract infection, urinary tract infection, or cellulitis  The patient is not neutropenic and does not exhibit a GI malabsorption state  The patient is eating (either orally or via tube) and/or has been taking other orally administered medications for a least 24 hours  The patient is improving clinically and has a Tmax < 100.5  If you have questions about this conversion, please contact the Pharmacy Department  []   [phone] )  Jeani Hawking [x]   [phone] )  [facility]  []   [phone] )  [facility] []   [phone] )  Ilene Qua   Celedonio Miyamoto, PharmD, BCPS Clinical Pharmacist Pager [phone]

## 2012-02-05 DIAGNOSIS — K219 Gastro-esophageal reflux disease without esophagitis: Secondary | ICD-10-CM | POA: Diagnosis present

## 2012-02-05 DIAGNOSIS — Z720 Tobacco use: Secondary | ICD-10-CM | POA: Diagnosis present

## 2012-02-05 DIAGNOSIS — IMO0001 Reserved for inherently not codable concepts without codable children: Secondary | ICD-10-CM | POA: Diagnosis present

## 2012-02-05 DIAGNOSIS — F17200 Nicotine dependence, unspecified, uncomplicated: Secondary | ICD-10-CM | POA: Diagnosis present

## 2012-02-05 DIAGNOSIS — J189 Pneumonia, unspecified organism: Secondary | ICD-10-CM | POA: Diagnosis present

## 2012-02-05 LAB — CULTURE, RESPIRATORY W GRAM STAIN

## 2012-02-05 MED ORDER — ZOLPIDEM TARTRATE 5 MG PO TABS: 5.0000 mg | ORAL_TABLET | Freq: Every evening | ORAL | Status: DC | PRN

## 2012-02-05 MED ORDER — PANTOPRAZOLE SODIUM 40 MG PO TBEC: 40.0000 mg | DELAYED_RELEASE_TABLET | Freq: Every day | ORAL | Status: DC

## 2012-02-05 MED ORDER — NICOTINE 21 MG/24HR TD PT24: 1.0000 | MEDICATED_PATCH | Freq: Every day | TRANSDERMAL | Status: DC

## 2012-02-05 MED ORDER — ALBUTEROL SULFATE (5 MG/ML) 0.5% IN NEBU: 2.5000 mg | INHALATION_SOLUTION | Freq: Four times a day (QID) | RESPIRATORY_TRACT | Status: DC | PRN

## 2012-02-05 MED ORDER — IPRATROPIUM BROMIDE 0.02 % IN SOLN: 0.5000 mg | Freq: Four times a day (QID) | RESPIRATORY_TRACT | Status: DC | PRN

## 2012-02-05 MED ORDER — PREDNISONE (PAK) 10 MG PO TABS: 10.0000 mg | ORAL_TABLET | Freq: Every day | ORAL | Status: AC

## 2012-02-05 MED ORDER — GUAIFENESIN-DM 100-10 MG/5ML PO SYRP: 5.0000 mL | ORAL_SOLUTION | ORAL | Status: AC | PRN

## 2012-02-05 MED ORDER — AZITHROMYCIN 500 MG PO TABS: 500.0000 mg | ORAL_TABLET | Freq: Every day | ORAL | Status: AC

## 2012-02-05 NOTE — Progress Notes (Signed)
Per Unit RN, Micheline Maze RN, pt up in hall ambulating with oxygen saturation dropping to 86% while on RA.  Patient back to room and placed 2L of oxygen with sat up to 92%. Contacted [facility] for oxygen for home. Will fax orders, progress note, facesheet and H&P. Explained to pt to call [facility] as soon as she arrives home to set up home oxygen. Explained portable tank on last 3-4 hours.   SATURATION QUALIFICATIONS:  Patient Saturations on Room Air at Rest = 92%  Patient Saturations on Room Air while Ambulating =86%  Patient Saturations on 2 Liters of oxygen while Ambulating = 92%

## 2012-02-05 NOTE — Discharge Summary (Signed)
Internal Medicine Teaching [facility] Discharge Note  Name: CHANIKA BYLAND MRN: [id number] DOB: 1956-06-30 56 y.o.  Date of Admission: 02/01/2012 10:05 AM Date of Discharge: 02/05/2012 Attending Physician: Rocco Serene, MD  Discharge Diagnosis: 1. Community acquired pneumonia 2. COPD exacerbation 3. GERD 4. Tobacco abuse   Discharge Medications: Medication List  As of 02/05/2012  1:38 PM   TAKE these medications         albuterol (5 MG/ML) 0.5% nebulizer solution   Commonly known as: PROVENTIL   Take 0.5 mLs (2.5 mg total) by nebulization every 6 (six) hours as needed for wheezing or shortness of breath.      azithromycin 500 MG tablet   Commonly known as: ZITHROMAX   Take 1 tablet (500 mg total) by mouth daily.      Fluticasone-Salmeterol 250-50 MCG/DOSE Aepb   Commonly known as: ADVAIR   Inhale 1-4 puffs into the lungs 2 (two) times daily as needed. For shortness of breath      guaiFENesin-dextromethorphan 100-10 MG/5ML syrup   Commonly known as: ROBITUSSIN DM   Take 5 mLs by mouth every 4 (four) hours as needed for cough.      ipratropium 0.02 % nebulizer solution   Commonly known as: ATROVENT   Take 2.5 mLs (0.5 mg total) by nebulization every 6 (six) hours as needed for wheezing.      nicotine 21 mg/24hr patch   Commonly known as: NICODERM CQ - dosed in mg/24 hours   Place 1 patch onto the skin daily.      pantoprazole 40 MG tablet   Commonly known as: PROTONIX   Take 1 tablet (40 mg total) by mouth daily at 12 noon.      predniSONE 10 MG tablet   Commonly known as: STERAPRED UNI-PAK   Take 1 tablet (10 mg total) by mouth daily. Take 60 mg daily for day 1-2, 50 mg daily for day 3-4, 40 mg daily for day 5-6, 30 mg daily for day 7-8, 20 mg po daily for day 9-10, 10 mg daily for day 11-12. Then off the medication.      zolpidem 5 MG tablet   Commonly known as: AMBIEN   Take 1 tablet (5 mg total) by mouth at bedtime as needed for sleep (insomnia).            Disposition and follow-up:   Ms.Mischelle E Kuhnle was discharged from [facility] in Stable condition.    Follow-up Appointments: Follow-up Information    Follow up with Taron Mondor, MD. (follow up with Dr. Dierdre Searles in 2 weeks, please call [phone] for appointment.)    Contact information:   [address] [place] [address] [phone]    Patient will need follow up CXR in 4-6 weeks    Follow up with [facility]. (Please call [facility] as soon as you arrive home to deliver home oxygen set up. Portable tank only last 3-4 hours.  )    Contact information:   [phone]          Consultations:  None  Procedures Performed:  Dg Chest 2 View  02/01/2012  *RADIOLOGY REPORT*  Clinical Data: Shortness of breath, cough, right chest pain  CHEST - 2 VIEW  Comparison: None.  Findings: Cardiomediastinal silhouette is unremarkable.  No pulmonary edema.  There is hazy airspace disease in the right lower lobe with subtle increased bronchial markings.  This  is suspicious for early infiltrate/pneumonia.  Follow-up to resolution is recommended after treatment.  IMPRESSION:  No pulmonary edema.  There is hazy airspace disease in the right lower lobe with subtle increased bronchial markings.  This  is suspicious for early infiltrate/pneumonia.  Follow-up to resolution is recommended after treatment.  Original Report Authenticated By: Natasha Mead, M.D.    Admission HPI:  Patient is a 56 y.o. female with a PMHx of COPD, who presents to [facility] for evaluation of shortness of breath been going on for past 3 weeks. Symptoms started with a dry cough. Her husband had the same symptoms at that time. It worsened progressively and become productive with brownish/green sputum. She started having shortness of breath throughout the day more so with mild exertion. She does not have any chest pain but does have back pain especially with coughing. She did not have any fever  but had chills and night sweats since the onset of symptoms. No leg edema. In past couple of days she started seeing some streaks of blood in her sputum. She went to urgent care center but she was prescribed Z-Pak and 2 weeks of prednisone which did not help her symptoms. She had similar symptoms one year ago but not this severe. No other complaints like rash, lightheadedness, travel, change in medicines etc. she uses Advair for her COPD and does what she continue to use since the onset of symptoms. She does not use albuterol or any other inhalers. She is to have a nebulizer machine before but has not used it in 6 months.   Hospital Course by problem list: #1. COPD exacerbation with RLL CAP.       Patient presents with productive cough and SOB on admission in the setting of a long hx of COPD and current Tobacco abuse. Her chest X ray indicates RLL PNA. She was treated with oxygen therapy, bronchodilators, steroids and IV antibiotics including azithromycin and Rocephin. She has improved significantly and is stable upon discharge. She will be discharged home with prednisone tapering dose and ABX azithromycin. She will continue her home O2 and nebulizer treatment.    # GERD, stable, continue PPI  # Tobacco abuse     She has a long history of Tobacco abuse and is willing to quit smoking. She is educated on smoking cessation and given Nicotine patch upon discharge.  She will continue to follow up with PCP as an outpatient.      Discharge Vitals:  BP 149/89  Pulse 126  Temp(Src) 98.3 F (36.8 C) (Oral)  Resp 28  Ht 5\' 8"  (1.727 m)  Wt 164 lb 1.6 oz (74.435 kg)  BMI 24.95 kg/m2  SpO2 86%  Discharge Labs: No results found for this or any previous visit (from the past 24 hour(s)).  Signed: Jose Alleyne 02/05/2012, 1:38 PM

## 2012-02-05 NOTE — Progress Notes (Signed)
Patient was ambulated in hallway several times at a moderate speed  And pulse ox went down to 86 and below then she became SOB and panic as we got closer to her room. Valeda Malm

## 2012-02-05 NOTE — Progress Notes (Signed)
Subjective: Patient feels much better. occassional cough. No SOB. No other C/O.  No acute events overnight. Objective: Vital signs in last 24 hours: Filed Vitals:   02/04/12 2126 02/05/12 0325 02/05/12 0407 02/05/12 0752  BP: 133/80  146/86   Pulse: 72  76   Temp: 98.1 F (36.7 C)  97.8 F (36.6 C)   TempSrc: Oral  Oral   Resp: 20  19   Height:      Weight:      SpO2: 96% 93% 91% 94%   Weight change:   Intake/Output Summary (Last 24 hours) at 02/05/12 0847 Last data filed at 02/04/12 1500  Gross per 24 hour  Intake    410 ml  Output      0 ml  Net    410 ml   Physical Exam: General: NAD.  Neck: No JVD, No carotid bruits  Lungs: Normal respiratory effort. Bibasilar lung sounds diminished with occasional wheezing. No rhonchi or rales. Heart: RRR, no M/G/R  Abd: soft, BS x 4  Ext: No edema  Lab Results: Basic Metabolic Panel:  Lab 02/04/12 9604 02/02/12 0611 02/01/12 1600  [place] 140 136 --  K 4.1 3.8 --  CL 103 102 --  CO2 26 23 --  GLUCOSE 127* 154* --  BUN 10 11 --  CREATININE 0.52 0.60 --  CALCIUM 9.1 9.2 --  MG -- -- 2.3  PHOS -- -- --   Liver Function Tests:  Lab 02/01/12 1600  AST 11  ALT 9  ALKPHOS 115  BILITOT 0.7  PROT 7.5  ALBUMIN 3.6   CBC:  Lab 02/04/12 0630 02/02/12 0611 02/01/12 1100  WBC 10.1 13.2* --  NEUTROABS -- -- 13.8*  HGB 13.5 13.3 --  HCT 41.3 40.5 --  MCV 86.8 86.0 --  PLT 206 164 --   CBG:  Lab 02/04/12 0810 02/04/12 0648 02/03/12 2211  GLUCAP 108* 128* 100*   Thyroid Function Tests:  Lab 02/01/12 1600  TSH 1.314  T4TOTAL --  FREET4 --  T3FREE --  THYROIDAB --    Micro Results: Recent Results (from the past 240 hour(s))  CULTURE, SPUTUM-ASSESSMENT     Status: Normal   Collection Time   02/01/12  5:40 PM      Component Value Range Status Comment   Specimen Description SPUTUM   Final    Special Requests NONE   Final    Sputum evaluation     Final    Value: THIS SPECIMEN IS ACCEPTABLE. RESPIRATORY CULTURE  REPORT TO FOLLOW.   Report Status 02/01/2012 FINAL   Final   CULTURE, RESPIRATORY     Status: Normal (Preliminary result)   Collection Time   02/01/12  5:40 PM      Component Value Range Status Comment   Specimen Description SPUTUM   Final    Special Requests NONE   Final    Gram Stain     Final    Value: FEW WBC PRESENT,BOTH PMN AND MONONUCLEAR     NO SQUAMOUS EPITHELIAL CELLS SEEN     NO ORGANISMS SEEN   Culture Culture reincubated for better growth   Final    Report Status PENDING   Incomplete    Studies/Results: No results found. Medications: I have reviewed the patient's current medications. Scheduled Meds:   . albuterol  2.5 mg Nebulization Q6H   And  . ipratropium  0.5 mg Nebulization Q6H  . azithromycin  500 mg Oral Daily  . cefTRIAXone (ROCEPHIN)  IV  1 g Intravenous Q24H  . chlorpheniramine-HYDROcodone  5 mL Oral Q12H  . enoxaparin  40 mg Subcutaneous Q24H  . methylPREDNISolone (SOLU-MEDROL) injection  40 mg Intravenous Q12H  . nicotine  21 mg Transdermal Daily  . pantoprazole  40 mg Oral Q1200  . DISCONTD: albuterol  2.5 mg Nebulization Q4H  . DISCONTD: albuterol  2.5 mg Nebulization Q4H  . DISCONTD: azithromycin  500 mg Intravenous Q24H  . DISCONTD: ipratropium  0.5 mg Nebulization Q4H  . DISCONTD: ipratropium  0.5 mg Nebulization Q6H   Continuous Infusions:  PRN Meds:.acetaminophen, acetaminophen, albuterol, guaiFENesin-dextromethorphan, ondansetron (ZOFRAN) IV, ondansetron, traMADol, zolpidem, DISCONTD: ipratropium Assessment/Plan:  #1. COPD exacerbation with RLL CAP.  Patient improves significantly, likely d/c today - prednisone tapering dose - ABX - neb treatment -HH setup   # GERD  Protonix  # Tobacco abuse  - Cessation counseling  - Nicotine patches  # DVT PPX -Lovenox    LOS: 4 days   Shloma Roggenkamp 02/05/2012, 8:47 AM

## 2012-02-05 NOTE — Discharge Instructions (Signed)
1. Please finish your antibiotics and prednisone 2. Please stop Tobacco 3. Follow up with me in 2 weeks at [facility] (at [facility]).

## 2012-02-06 NOTE — Progress Notes (Signed)
Late entry:     CARE MANAGEMENT NOTE 02/06/2012  Patient:  [name] [name], [name] [name]   Account Number:  [id number]  Date Initiated:  02/02/2012  Documentation initiated by:  Ronny Flurry  Subjective/Objective Assessment:   DX: COPD    smoker     Action/Plan:   Order for Oxygen tank and nebulizer. Met with pt re oxygen and per pt she has a concentrator that she purchased from [place]. Most likely will need to try to get [employer] to provide Tanks, as no provider will just provide tanks.   Anticipated DC Date:  02/07/2012   Anticipated DC Plan:  HOME W HOME HEALTH SERVICES      DC Planning Services  CM consult      Choice offered to / List presented to:     DME arranged  NEBULIZER MACHINE  OXYGEN      DME agency  [facility]        Status of service:  Completed, signed off Medicare Important Message given?   (If response is "NO", the following Medicare IM given date fields will be blank) Date Medicare IM given:   Date Additional Medicare IM given:    Discharge Disposition:  HOME/SELF CARE  Per UR Regulation:  Reviewed for med. necessity/level of care/duration of stay  If discussed at Long Length of Stay Meetings, dates discussed:    Comments:  Elliot Cousin, RN Case Manager Signed CASE MANAGEMENT Progress Notes 02/05/2012 1:21 PM  Per Unit RN, Micheline Maze RN, pt up in hall ambulating with oxygen saturation dropping to 86% while on RA. Patient back to room and placed 2L of oxygen with sat up to 92%. Contacted [facility] for oxygen for home. Will fax orders, progress note, facesheet and H&P. Explained to pt to call [facility] as soon as she arrives home to set up home oxygen. Explained portable tank on last 3-4 hours.   SATURATION QUALIFICATIONS:   Patient Saturations on Room Air at Rest = 92%   Patient Saturations on Room Air while Ambulating =86%   Patient Saturations on 2 Liters of oxygen while Ambulating = 92%       02/02/2012 Pt has concentrator that she  purchased from [place], will call that provider re tanks, as no other provider will supply pt with tanks only. Pt would have to pay out of pocket for tanks or start over with concentrator and tanks from another company. This CM ordered nebulizer from [facility] however they will no provide tanks only.     CARE MANAGEMENT NOTE 02/06/2012  Patient:  [name] [name], [name] [name]   Account Number:  [id number]  Date Initiated:  02/02/2012  Documentation initiated by:  Ronny Flurry  Subjective/Objective Assessment:   DX: COPD    smoker     Action/Plan:   Order for Oxygen tank and nebulizer. Met with pt re oxygen and per pt she has a concentrator that she purchased from [place]. Most likely will need to try to get [employer] to provide Tanks, as no provider will just provide tanks.   Anticipated DC Date:  02/07/2012   Anticipated DC Plan:  HOME W HOME HEALTH SERVICES      DC Planning Services  CM consult      Choice offered to / List presented to:     DME arranged  NEBULIZER MACHINE  OXYGEN      DME agency  [facility]        Status of service:  Completed, signed off Medicare Important Message given?   (  If response is "NO", the following Medicare IM given date fields will be blank) Date Medicare IM given:   Date Additional Medicare IM given:    Discharge Disposition:  HOME/SELF CARE  Per UR Regulation:  Reviewed for med. necessity/level of care/duration of stay  If discussed at Long Length of Stay Meetings, dates discussed:    Comments:  Elliot Cousin, RN Case Manager Signed CASE MANAGEMENT Progress Notes 02/05/2012 1:21 PM  Per Unit RN, Micheline Maze RN, pt up in hall ambulating with oxygen saturation dropping to 86% while on RA. Patient back to room and placed 2L of oxygen with sat up to 92%. Contacted [facility] for oxygen for home. Will fax orders, progress note, facesheet and H&P. Explained to pt to call [facility] as soon as she arrives home to set up home oxygen. Explained portable tank  on last 3-4 hours.   SATURATION QUALIFICATIONS:   Patient Saturations on Room Air at Rest = 92%   Patient Saturations on Room Air while Ambulating =86%   Patient Saturations on 2 Liters of oxygen while Ambulating = 92%       02/02/2012 Pt has concentrator that she purchased from [place], will call that provider re tanks, as no other provider will supply pt with tanks only. Pt would have to pay out of pocket for tanks or start over with concentrator and tanks from another company. This CM ordered nebulizer from [facility] however they will no provide tanks only.

## 2012-02-15 ENCOUNTER — Encounter: Payer: Self-pay | Admitting: Internal Medicine

## 2012-02-15 ENCOUNTER — Ambulatory Visit (INDEPENDENT_AMBULATORY_CARE_PROVIDER_SITE_OTHER): Payer: Medicare Other | Admitting: Internal Medicine

## 2012-02-15 VITALS — BP 130/80 | HR 86 | Temp 97.0°F | Ht 68.0 in | Wt 305.8 lb

## 2012-02-15 DIAGNOSIS — J441 Chronic obstructive pulmonary disease with (acute) exacerbation: Secondary | ICD-10-CM

## 2012-02-15 DIAGNOSIS — K219 Gastro-esophageal reflux disease without esophagitis: Secondary | ICD-10-CM

## 2012-02-15 DIAGNOSIS — F17200 Nicotine dependence, unspecified, uncomplicated: Secondary | ICD-10-CM

## 2012-02-15 DIAGNOSIS — J189 Pneumonia, unspecified organism: Secondary | ICD-10-CM

## 2012-02-15 DIAGNOSIS — Z720 Tobacco use: Secondary | ICD-10-CM

## 2012-02-15 MED ORDER — BUPROPION HCL ER (SMOKING DET) 150 MG PO TB12: 150.0000 mg | ORAL_TABLET | Freq: Two times a day (BID) | ORAL | Status: DC

## 2012-02-15 MED ORDER — IPRATROPIUM BROMIDE 0.02 % IN SOLN: 0.5000 mg | Freq: Four times a day (QID) | RESPIRATORY_TRACT | Status: DC | PRN

## 2012-02-15 MED ORDER — ALBUTEROL SULFATE (5 MG/ML) 0.5% IN NEBU: 2.5000 mg | INHALATION_SOLUTION | Freq: Four times a day (QID) | RESPIRATORY_TRACT | Status: DC | PRN

## 2012-02-15 MED ORDER — NICOTINE 21 MG/24HR TD PT24: 1.0000 | MEDICATED_PATCH | TRANSDERMAL | Status: DC

## 2012-02-15 NOTE — Assessment & Plan Note (Addendum)
Stable after discharge.   - continue current regimen - smoking cessation. -Her O2 Sat is 96% on RA at rest and 90 % on RA after ambulate in hallways x 2.   Patient is instructed NOT TO wear O2 at home during the day now.   will call her [facility] agency to set up Nocturnal O2 Sat check to see if she desat to < 88%.

## 2012-02-15 NOTE — Assessment & Plan Note (Signed)
Resolved.   - continue medical management of COPD -smoking cessation.

## 2012-02-15 NOTE — Progress Notes (Signed)
Patient ID: [name] [name], female   DOB: 01/26/1956, 56 y.o.   MRN: [id number]  Subjective:   Patient ID: [name] [name] female   DOB: 13-Apr-1956 56 y.o.   MRN: [id number]  HPI: [name] is a 56 y.o. with PMH of recent hospitalization for CAP/COPD exacerbation, COPD and long hx of Tobacco abuse who presents to the clinic for follow up visit. She states that she feels well after she was discharged. She still has minimum nonproductive cough but feels much better than before. She states that she does not have SOB at regular basis except when she ambulate "fast " over a block. She states that she is at her baseline.  She has finished ABX treatment and is at her end of prednisone tapering course. She states that she has been compliant with her medical treatment.  Patient has had a long hx of smoking and decided to quit it. She has been off Cig for 2 weeks. She states that she could afford the Nicotine patch and would like to have medication to help her to quit smoking.   No headache, fever, or sore throat. No chest pain, chest pressure or palpitation No nausea, vomiting, or abdominal pain. No melena, diarrhea or incontinence. No muscle weakness.                   Denies depression. No appetite or weight changes.      Past Medical History  Diagnosis Date  . COPD (chronic obstructive pulmonary disease)     last exacerbation 1 year ago, treated as outpatient  . Shortness of breath   . Recurrent upper respiratory infection (URI)   . Pneumonia   . GERD (gastroesophageal reflux disease)    Current Outpatient Prescriptions  Medication Sig Dispense Refill  . albuterol (PROVENTIL) (5 MG/ML) 0.5% nebulizer solution Take 0.5 mLs (2.5 mg total) by nebulization every 6 (six) hours as needed for wheezing or shortness of breath.  20 mL  11  . Fluticasone-Salmeterol (ADVAIR) 250-50 MCG/DOSE AEPB Inhale 1-4 puffs into the lungs 2 (two) times daily as needed. For shortness of breath      .  guaiFENesin-dextromethorphan (ROBITUSSIN DM) 100-10 MG/5ML syrup Take 5 mLs by mouth every 4 (four) hours as needed for cough.  118 mL  0  . ipratropium (ATROVENT) 0.02 % nebulizer solution Take 2.5 mLs (0.5 mg total) by nebulization every 6 (six) hours as needed for wheezing.  75 mL  11  . pantoprazole (PROTONIX) 40 MG tablet Take 1 tablet (40 mg total) by mouth daily at 12 noon.  30 tablet  0  . predniSONE (STERAPRED UNI-PAK) 10 MG tablet Take 1 tablet (10 mg total) by mouth daily. Take 60 mg daily for day 1-2, 50 mg daily for day 3-4, 40 mg daily for day 5-6, 30 mg daily for day 7-8, 20 mg po daily for day 9-10, 10 mg daily for day 11-12. Then off the medication.  42 tablet  0  . zolpidem (AMBIEN) 5 MG tablet Take 1 tablet (5 mg total) by mouth at bedtime as needed for sleep (insomnia).  30 tablet  0  . DISCONTD: albuterol (PROVENTIL) (5 MG/ML) 0.5% nebulizer solution Take 0.5 mLs (2.5 mg total) by nebulization every 6 (six) hours as needed for wheezing or shortness of breath.  20 mL  1  . DISCONTD: ipratropium (ATROVENT) 0.02 % nebulizer solution Take 2.5 mLs (0.5 mg total) by nebulization every 6 (six) hours as needed for wheezing.  75 mL  1  . buPROPion (ZYBAN) 150 MG 12 hr tablet Take 1 tablet (150 mg total) by mouth 2 (two) times daily.  180 tablet  0  . nicotine (NICODERM CQ - DOSED IN MG/24 HOURS) 21 mg/24hr patch Place 1 patch onto the skin daily.  28 patch  0   Family History  Problem Relation Age of Onset  . Breast cancer Mother   . Breast cancer Maternal Grandmother    History   Social History  . Marital Status: Married    Spouse Name: N/A    Number of Children: N/A  . Years of Education: N/A   Social History Main Topics  . Smoking status: Former Smoker    Types: Cigarettes    Quit date: 02/05/2012  . Smokeless tobacco: None   Comment: states she has already quit smoking  . Alcohol Use: No  . Drug Use: No  . Sexually Active: Not Currently    Birth Control/ Protection:  Post-menopausal   Other Topics Concern  . None   Social History Narrative   lives in [place] with her husband. Has one son who is 45 years old. Used to work in [employer], on disability now. Has Medicare. Smokes 2 packs per day for past 40 years. Has not had a cigarette last 3 weeks since she got sick. Never drank alcohol. Never did  drugs.   Review of Systems: See HPI  Objective:  Physical Exam: Filed Vitals:   02/15/12 0827  BP: 130/80  Pulse: 86  Temp: 97 F (36.1 C)  TempSrc: Oral  Height: 5\' 8"  (1.727 m)  Weight: 305 lb 12.8 oz (138.71 kg)  morbid obesity General: alert, well-developed, and cooperative to examination.  Head: normocephalic and atraumatic.  Eyes: vision grossly intact, pupils equal, pupils round, pupils reactive to light, no injection and anicteric.  Mouth: pharynx pink and moist, no erythema, and no exudates.  Neck: supple, full ROM, no thyromegaly, no JVD, and no carotid bruits.  Lungs: normal respiratory effort, no accessory muscle use, normal breath sounds, no crackles, and no wheezes. Heart: normal rate, regular rhythm, no murmur, no gallop, and no rub.  Abdomen: soft, non-tender, normal bowel sounds, no distention, no guarding, no rebound tenderness, no hepatomegaly, and no splenomegaly.  Msk: no joint swelling, no joint warmth, and no redness over joints.  Pulses: 2+ DP/PT pulses bilaterally Extremities: No cyanosis, clubbing, edema Neurologic: alert & oriented X3, cranial nerves II-XII intact, strength normal in all extremities, sensation intact to light touch, and gait normal.  Skin: turgor normal and no rashes.  Psych: Oriented X3, memory intact for recent and remote, normally interactive, good eye contact, not anxious appearing, and not depressed appearing.   Assessment & Plan:

## 2012-02-15 NOTE — Assessment & Plan Note (Signed)
Stable, well controlled by PPI.

## 2012-02-15 NOTE — Patient Instructions (Signed)
1. Follow up with me in one month 2. Try Zyban for smoking cessation. 3. Please use your inhalers as prescribed.

## 2012-02-15 NOTE — Assessment & Plan Note (Signed)
Patient would like to quit smoking and states that she can not afford Nicotine patch.  - will prescribe Zyban

## 2012-02-19 ENCOUNTER — Emergency Department (HOSPITAL_COMMUNITY): Payer: Medicare Other

## 2012-02-19 ENCOUNTER — Emergency Department (HOSPITAL_COMMUNITY)
Admission: EM | Admit: 2012-02-19 | Discharge: 2012-02-19 | Disposition: A | Discharge status: Home / Self Care | Payer: Medicare Other | Attending: Emergency Medicine | Admitting: Emergency Medicine

## 2012-02-19 ENCOUNTER — Encounter (HOSPITAL_COMMUNITY): Payer: Self-pay | Admitting: *Deleted

## 2012-02-19 DIAGNOSIS — R0602 Shortness of breath: Secondary | ICD-10-CM | POA: Insufficient documentation

## 2012-02-19 DIAGNOSIS — J4489 Other specified chronic obstructive pulmonary disease: Secondary | ICD-10-CM | POA: Insufficient documentation

## 2012-02-19 DIAGNOSIS — K219 Gastro-esophageal reflux disease without esophagitis: Secondary | ICD-10-CM | POA: Insufficient documentation

## 2012-02-19 DIAGNOSIS — R079 Chest pain, unspecified: Secondary | ICD-10-CM | POA: Insufficient documentation

## 2012-02-19 DIAGNOSIS — R070 Pain in throat: Secondary | ICD-10-CM | POA: Insufficient documentation

## 2012-02-19 DIAGNOSIS — J449 Chronic obstructive pulmonary disease, unspecified: Secondary | ICD-10-CM

## 2012-02-19 DIAGNOSIS — R059 Cough, unspecified: Secondary | ICD-10-CM | POA: Insufficient documentation

## 2012-02-19 DIAGNOSIS — R05 Cough: Secondary | ICD-10-CM | POA: Insufficient documentation

## 2012-02-19 DIAGNOSIS — M549 Dorsalgia, unspecified: Secondary | ICD-10-CM | POA: Insufficient documentation

## 2012-02-19 LAB — POCT I-STAT, CHEM 8
BUN: 7 mg/dL (ref 6–23)
Calcium, Ion: 1.13 mmol/L (ref 1.12–1.32)
Creatinine, Ser: 0.8 mg/dL (ref 0.50–1.10)
Hemoglobin: 14.6 g/dL (ref 12.0–15.0)
Sodium: 140 mEq/L (ref 135–145)
TCO2: 25 mmol/L (ref 0–100)

## 2012-02-19 LAB — DIFFERENTIAL
Basophils Absolute: 0 10*3/uL (ref 0.0–0.1)
Lymphocytes Relative: 19 % (ref 12–46)
Lymphs Abs: 2.2 10*3/uL (ref 0.7–4.0)
Neutro Abs: 8.4 10*3/uL — ABNORMAL HIGH (ref 1.7–7.7)
Neutrophils Relative %: 73 % (ref 43–77)

## 2012-02-19 LAB — CBC
Platelets: 157 10*3/uL (ref 150–400)
RBC: 5.05 MIL/uL (ref 3.87–5.11)
WBC: 11.5 10*3/uL — ABNORMAL HIGH (ref 4.0–10.5)

## 2012-02-19 LAB — POCT I-STAT TROPONIN I

## 2012-02-19 MED ORDER — HYDROCODONE-ACETAMINOPHEN 5-325 MG PO TABS
2.0000 | ORAL_TABLET | Freq: Once | ORAL | Status: AC
  Administered 2012-02-19: 2 via ORAL
  Filled 2012-02-19: qty 2

## 2012-02-19 MED ORDER — CLARITHROMYCIN 500 MG PO TABS: 500.0000 mg | ORAL_TABLET | Freq: Two times a day (BID) | ORAL | Status: AC

## 2012-02-19 MED ORDER — PREDNISONE 20 MG PO TABS
60.0000 mg | ORAL_TABLET | Freq: Once | ORAL | Status: AC
  Administered 2012-02-19: 60 mg via ORAL
  Filled 2012-02-19: qty 3

## 2012-02-19 MED ORDER — ALBUTEROL SULFATE (5 MG/ML) 0.5% IN NEBU
5.0000 mg | INHALATION_SOLUTION | Freq: Once | RESPIRATORY_TRACT | Status: AC
  Administered 2012-02-19: 5 mg via RESPIRATORY_TRACT
  Filled 2012-02-19: qty 1

## 2012-02-19 MED ORDER — HYDROCODONE-ACETAMINOPHEN 5-500 MG PO TABS: 1.0000 | ORAL_TABLET | Freq: Four times a day (QID) | ORAL | Status: AC | PRN

## 2012-02-19 MED ORDER — PREDNISONE 20 MG PO TABS: 40.0000 mg | ORAL_TABLET | Freq: Every day | ORAL | Status: DC

## 2012-02-19 MED ORDER — IPRATROPIUM BROMIDE 0.02 % IN SOLN
0.5000 mg | Freq: Once | RESPIRATORY_TRACT | Status: AC
  Administered 2012-02-19: 0.5 mg via RESPIRATORY_TRACT
  Filled 2012-02-19: qty 2.5

## 2012-02-19 MED ORDER — ALPRAZOLAM 0.5 MG PO TABS
0.5000 mg | ORAL_TABLET | Freq: Once | ORAL | Status: AC
  Administered 2012-02-19: 0.5 mg via ORAL
  Filled 2012-02-19: qty 1

## 2012-02-19 NOTE — ED Notes (Signed)
X 2 weeks ago in hosp. For pna and d/c'd on 3/30. X 4 days ok. And then started feeling bad. The difference is lt. Side of throat sore and no wheezing. Still coughing yellow/green mucous.

## 2012-02-19 NOTE — ED Provider Notes (Signed)
Medical screening examination/treatment/procedure(s) were conducted as a shared visit with non-physician practitioner(s) and myself.  I personally evaluated the patient during the encounter Pt with hx copd, non productive cough, mild sob. No chest pain. States low back sore from coughing so much. Chest mild wheezing. No rales.   Suzi Roots, MD 02/19/12 2042

## 2012-02-19 NOTE — Discharge Instructions (Signed)
Your chest x-ray looks normal today. Continue your breathing treatments, try taking one every 4 hrs while awake. Prednisone as prescribed. Take clartirhomycin as prescribed until all gone for the infection. Take vicodin for pain as prescribed as needed. Follow up with your doctor next week for recheck.   Chronic Obstructive Pulmonary Disease Chronic obstructive pulmonary disease (COPD) is a condition in which airflow from the lungs is restricted. The lungs can never return to normal, but there are measures you can take which will improve them and make you feel better. CAUSES   Smoking.   Exposure to secondhand smoke.   Breathing in irritants (pollution, cigarette smoke, strong smells, aerosol sprays, paint fumes).   History of lung infections.  TREATMENT  Treatment focuses on making you comfortable (supportive care). Your caregiver may prescribe medications (inhaled or pills) to help improve your breathing. HOME CARE INSTRUCTIONS   If you smoke, stop smoking.   Avoid exposure to smoke, chemicals, and fumes that aggravate your breathing.   Take antibiotic medicines as directed by your caregiver.   Avoid medicines that dry up your system and slow down the elimination of secretions (antihistamines and cough syrups). This decreases respiratory capacity and may lead to infections.   Drink enough water and fluids to keep your urine clear or pale yellow. This loosens secretions.   Use humidifiers at home and at your bedside if they do not make breathing difficult.   Receive all protective vaccines your caregiver suggests, especially pneumococcal and influenza.   Use home oxygen as suggested.   Stay active. Exercise and physical activity will help maintain your ability to do things you want to do.   Eat a healthy diet.  SEEK MEDICAL CARE IF:   You develop pus-like mucus (sputum).   Breathing is more labored or exercise becomes difficult to do.   You are running out of the medicine  you take for your breathing.  SEEK IMMEDIATE MEDICAL CARE IF:   You have a rapid heart rate.   You have agitation, confusion, tremors, or are in a stupor (family members may need to observe this).   It becomes difficult to breathe.   You develop chest pain.   You have a fever.  MAKE SURE YOU:   Understand these instructions.   Will watch your condition.   Will get help right away if you are not doing well or get worse.  Document Released: 08/03/2005 Document Revised: 10/13/2011 Document Reviewed: 12/24/2010 [facility] Patient Information 2012 [place], [place].

## 2012-02-19 NOTE — ED Provider Notes (Signed)
History     CSN: [id number]  Arrival date & time 02/19/12  1338   First MD Initiated Contact with Patient 02/19/12 1525      Chief Complaint  Patient presents with  . Shortness of Breath  . Sore Throat    (Consider location/radiation/quality/duration/timing/severity/associated sxs/prior treatment) HPI Comments: Pt is a 56yo female, who presents with cc of cough, shortness of breath, sore throat. Pt states she has COPD on oxygen at night time only. States was hospitalized and discharged 2 weeks ago, after being diagnosed with CAP. Pt states she finished all of her antibiotic and prednisone. She is still doing neb treatments at home several time a day. States when was discharged she felt better for about 4 days, afterwards, states began coughing again, sore throat. Cough persistent, coughing up green sputum. Denies fever, chills. States has had constant back pain and chest pain for several days. Denies neck pain or stiffness, denies nausea, vomiting, malaise.   Past Medical History  Diagnosis Date  . COPD (chronic obstructive pulmonary disease)     last exacerbation 1 year ago, treated as outpatient  . Shortness of breath   . Recurrent upper respiratory infection (URI)   . Pneumonia   . GERD (gastroesophageal reflux disease)     Past Surgical History  Procedure Date  . Abdominal hysterectomy   . Appendectomy     Family History  Problem Relation Age of Onset  . Breast cancer Mother   . Breast cancer Maternal Grandmother     History  Substance Use Topics  . Smoking status: Former Smoker    Types: Cigarettes    Quit date: 02/05/2012  . Smokeless tobacco: Not on file   Comment: states she has already quit smoking  . Alcohol Use: No    OB History    Grav Para Term Preterm Abortions TAB SAB Ect Mult Living                  Review of Systems  Constitutional: Negative for fever and chills.  HENT: Positive for sore throat. Negative for congestion and trouble  swallowing.   Eyes: Negative.   Respiratory: Positive for cough, chest tightness and shortness of breath. Negative for wheezing.   Cardiovascular: Positive for chest pain. Negative for palpitations and leg swelling.  Gastrointestinal: Negative for nausea, vomiting, abdominal pain and diarrhea.  Genitourinary: Negative.   Musculoskeletal: Negative.   Skin: Negative.   Neurological: Negative.   Psychiatric/Behavioral: Negative.     Allergies  Ibuprofen  Home Medications   Current Outpatient Rx  Name Route Sig Dispense Refill  . ALBUTEROL SULFATE (5 MG/ML) 0.5% IN NEBU Nebulization Take 2.5 mg by nebulization every 6 (six) hours as needed. Wheezing or shortness of breath    . BUPROPION HCL ER (SMOKING DET) 150 MG PO TB12 Oral Take 150 mg by mouth 2 (two) times daily.    . IPRATROPIUM BROMIDE 0.02 % IN SOLN Nebulization Take 0.5 mg by nebulization every 6 (six) hours as needed. Shortness of breath    . PANTOPRAZOLE SODIUM 40 MG PO TBEC Oral Take 40 mg by mouth daily at 12 noon.    Marland Kitchen ZOLPIDEM TARTRATE 5 MG PO TABS Oral Take 5 mg by mouth at bedtime as needed.      BP 124/61  Pulse 95  Temp(Src) 97.7 F (36.5 C) (Oral)  Resp 20  SpO2 97%  Physical Exam  Nursing note and vitals reviewed. Constitutional: She is oriented to person, place, and time. She  appears well-developed and well-nourished. No distress.  HENT:  Head: Normocephalic and atraumatic.  Right Ear: External ear normal.  Left Ear: External ear normal.  Nose: Nose normal.  Mouth/Throat: Oropharynx is clear and moist.  Eyes: Conjunctivae are normal.  Neck: Neck supple.  Cardiovascular: Normal rate, regular rhythm and normal heart sounds.   Pulmonary/Chest: Effort normal. No respiratory distress. She has no wheezes. She has no rales.       Decreased air movement bilaterally  Abdominal: Soft. Bowel sounds are normal. She exhibits no distension. There is no tenderness.  Musculoskeletal: Normal range of motion. She  exhibits no edema.  Neurological: She is alert and oriented to person, place, and time.  Skin: Skin is warm and dry.  Psychiatric: She has a normal mood and affect.    ED Course  Procedures (including critical care time)  Pt with recent PNA, here with increased cough and SOB. Pt is an ex smoker, used to smoke 2 ppd for 40 years. Diagnosed with COPD, on O2 at night time only. Denies fevers. Will get labs, neb tx, steroids.   Results for orders placed during the hospital encounter of 02/19/12  CBC      Component Value Range   WBC 11.5 (*) 4.0 - 10.5 (K/uL)   RBC 5.05  3.87 - 5.11 (MIL/uL)   Hemoglobin 14.6  12.0 - 15.0 (g/dL)   HCT 16.1  09.6 - 04.5 (%)   MCV 86.3  78.0 - 100.0 (fL)   MCH 28.9  26.0 - 34.0 (pg)   MCHC 33.5  30.0 - 36.0 (g/dL)   RDW 40.9  81.1 - 91.4 (%)   Platelets 157  150 - 400 (K/uL)  DIFFERENTIAL      Component Value Range   Neutrophils Relative 73  43 - 77 (%)   Neutro Abs 8.4 (*) 1.7 - 7.7 (K/uL)   Lymphocytes Relative 19  12 - 46 (%)   Lymphs Abs 2.2  0.7 - 4.0 (K/uL)   Monocytes Relative 6  3 - 12 (%)   Monocytes Absolute 0.7  0.1 - 1.0 (K/uL)   Eosinophils Relative 1  0 - 5 (%)   Eosinophils Absolute 0.1  0.0 - 0.7 (K/uL)   Basophils Relative 0  0 - 1 (%)   Basophils Absolute 0.0  0.0 - 0.1 (K/uL)  POCT I-STAT TROPONIN I      Component Value Range   Troponin i, poc 0.02  0.00 - 0.08 (ng/mL)   Comment 3           POCT I-STAT, CHEM 8      Component Value Range   Sodium 140  135 - 145 (mEq/L)   Potassium 3.7  3.5 - 5.1 (mEq/L)   Chloride 105  96 - 112 (mEq/L)   BUN 7  6 - 23 (mg/dL)   Creatinine, Ser 7.82  0.50 - 1.10 (mg/dL)   Glucose, Bld 99  70 - 99 (mg/dL)   Calcium, Ion 9.56  2.13 - 1.32 (mmol/L)   TCO2 25  0 - 100 (mmol/L)   Hemoglobin 14.6  12.0 - 15.0 (g/dL)   HCT 08.6  57.8 - 46.9 (%)   Dg Chest 2 View  02/19/2012  *RADIOLOGY REPORT*  Clinical Data: Shortness of breath.  Cough with sore throat.  CHEST - 2 VIEW  Comparison: 02/01/2012  and 09/24/2007.  Findings: The heart size and mediastinal contours are stable. There is mild chronic interstitial prominence.  Previously demonstrated right basilar air space disease  has resolved.  There is no pleural effusion.  Osseous structures appear stable.  IMPRESSION: Resolution of right basilar air space disease.  No acute cardiopulmonary process.  Original Report Authenticated By: Gerrianne Scale, M.D.   6:44 PM CXR negative, labs unremarkable. Pt very frustrated because she is "tired of always being SOB." Pt is maintaining her Oxygen sats above 97 on room air. She was given vicodin for pain and xanax for anxiety. She states the only thing that helps her usually is biaxin. Will give her a prescription for biaxin, steroid pack, nebs at home. Will dc with pcp follow up    1. COPD (chronic obstructive pulmonary disease)       MDM          Lottie Mussel, PA 02/19/12 1846

## 2012-02-19 NOTE — ED Notes (Signed)
Pt to ED for eval of cough and sob. Pt reports she has in hospital for one week and discharged 2 weeks ago for PNA. Pt reports she has been taking all of her medications as rx'd. Reports she was feeling better at first but over the last week she had had progress sob. Pt reports sob with exertion and at rest. Pt able to speak in complete sentences but is sob after prolonged talking. Lungs diminished but no wheezing heard. Pt has been wearing O2 at home at night. Reports no relief from neubs at home.

## 2012-02-21 ENCOUNTER — Telehealth: Payer: Self-pay | Admitting: *Deleted

## 2012-02-21 NOTE — Telephone Encounter (Signed)
Pt scheduled for appointment @ 10:30 Wednesday 4/17 Dr Dierdre Searles to call pt

## 2012-02-21 NOTE — Telephone Encounter (Signed)
Pt called with c/o coughing up blood.  Today she states each time she coughs she sees red clots of blood about size of quarter.  She also c/o being tired, SOB which causes a coughing spell and followed with diaphoresis.  Denies fever. She was seen in ED 2 nights ago for productive cough and SOB.  ED did a chest xray which was neg.,  2 breathing treatments and pt given  Prednisone and biaxin 500 mg for 7 days. Seen in clinic 4/10 by Dr Dierdre Searles  Pt # [phone] Please advise  Hx: pneumonia

## 2012-02-21 NOTE — Telephone Encounter (Signed)
Patient recovering from pneumonitis admission.  Has streaky hemoptysis but no new or worsening dyspnea.  Has app't for tomorrow.  Instructed to come to ER if she develops dyspnea or new fever.

## 2012-02-22 ENCOUNTER — Encounter: Payer: Self-pay | Admitting: Internal Medicine

## 2012-02-22 ENCOUNTER — Ambulatory Visit (INDEPENDENT_AMBULATORY_CARE_PROVIDER_SITE_OTHER): Payer: Medicare Other | Admitting: Internal Medicine

## 2012-02-22 VITALS — BP 123/82 | HR 88 | Temp 97.0°F | Ht 68.0 in | Wt 306.5 lb

## 2012-02-22 DIAGNOSIS — Z720 Tobacco use: Secondary | ICD-10-CM

## 2012-02-22 DIAGNOSIS — C07 Malignant neoplasm of parotid gland: Secondary | ICD-10-CM

## 2012-02-22 DIAGNOSIS — J40 Bronchitis, not specified as acute or chronic: Secondary | ICD-10-CM

## 2012-02-22 DIAGNOSIS — F17200 Nicotine dependence, unspecified, uncomplicated: Secondary | ICD-10-CM

## 2012-02-22 MED ORDER — DOXYCYCLINE HYCLATE 100 MG PO TABS: 100.0000 mg | ORAL_TABLET | Freq: Two times a day (BID) | ORAL | Status: AC

## 2012-02-22 NOTE — Telephone Encounter (Signed)
Pt called several times yesterday and again today.  No answer.  I left message with appointment time and date.

## 2012-02-22 NOTE — Assessment & Plan Note (Signed)
She continues to he cig free for 3 weeks now. She is on Zyban now. ( she is unable to afford Nicotine patch and in the process of getting free samples)  - continue the current tx

## 2012-02-22 NOTE — Assessment & Plan Note (Signed)
The clinical manifestation is consistent with bronchitis in the setting of long hx of COPD and smoking.(Patient has been off cig for 3 weeks.)  She was noted to be discharged on Azithromycin on 02/05/12 and she states that ER-prescribed Biaxin is not working.  - stop Biaxin - Doxycycline 100 mg po BID x 10 days

## 2012-02-22 NOTE — Assessment & Plan Note (Signed)
Patient c/o sore throat for a few days. Mild redness and swelling noted around orifice of her parotid gland.  - improve oral hygiene.  - Doxycycline

## 2012-02-22 NOTE — Progress Notes (Signed)
Patient ID: [name] [name], female   DOB: 03-01-1956, 56 y.o.   MRN: [id number]  Subjective:   Patient ID: [name] [name] female   DOB: 11/14/1955 56 y.o.   MRN: [id number]  HPI: [name] [name] is a 56 y.o.  Patient ID: [name] [name], female   DOB: 10-13-1956, 56 y.o.   MRN: [id number]  Subjective:   Patient ID: [name] [name] female   DOB: July 11, 1956 56 y.o.   MRN: [id number]  HPI: [name] [name] is a 56 y.o. with PMH of recent hospitalization for CAP/COPD exacerbation, COPD and long hx of Tobacco abuse who presents to the clinic for worsening of productive cough. She states that she feels well after she was discharged on 02/05/12 up until 2 weeks ago when she started to feel sick. She reports initial left sided sore throat which quickly switched to her right throat. She also notice progressive worsening of productive cough with moderate greenish/yellowish sputum. She went to [facility] ED on 02/19/12 for further evaluation. Her CXR indicated " Resolution of right basilar air space disease. No acute cardiopulmonary process". She was given one week supply of Biaxin and prednisone tapering dose. Patient states that her symptoms were getting worsening while on above medications. She started to experience mild dyspnea with exertion worsening than that of her baseline. And she reports coughing up some sputum mixed with blood streaks yesterday. She described one episode of quarter size pinkish blood noted in her sputum. She is here for further evaluation. She also endorse sweating day and night time.    Of note, she has been compliant with all her inhalers and neb treatment. She quit smoking for 3 weeks now.  No headache, fever or chills No chest pain, chest pressure or palpitation No nausea, vomiting, or abdominal pain. No melena, diarrhea or incontinence. No muscle weakness.   Denies depression. No appetite or weight changes.      Past Medical History  Diagnosis Date  . COPD (chronic  obstructive pulmonary disease)     last exacerbation 1 year ago, treated as outpatient  . Shortness of breath   . Recurrent upper respiratory infection (URI)   . Pneumonia   . GERD (gastroesophageal reflux disease)    Current Outpatient Prescriptions  Medication Sig Dispense Refill  . albuterol (PROVENTIL) (5 MG/ML) 0.5% nebulizer solution Take 2.5 mg by nebulization every 6 (six) hours as needed. Wheezing or shortness of breath      . buPROPion (ZYBAN) 150 MG 12 hr tablet Take 150 mg by mouth 2 (two) times daily.      . clarithromycin (BIAXIN) 500 MG tablet Take 1 tablet (500 mg total) by mouth 2 (two) times daily.  14 tablet  0  . HYDROcodone-acetaminophen (VICODIN) 5-500 MG per tablet Take 1-2 tablets by mouth every 6 (six) hours as needed for pain.  15 tablet  0  . ipratropium (ATROVENT) 0.02 % nebulizer solution Take 0.5 mg by nebulization every 6 (six) hours as needed. Shortness of breath      . pantoprazole (PROTONIX) 40 MG tablet Take 40 mg by mouth daily at 12 noon.      . predniSONE (DELTASONE) 20 MG tablet Take 2 tablets (40 mg total) by mouth daily.  10 tablet  0  . zolpidem (AMBIEN) 5 MG tablet Take 5 mg by mouth at bedtime as needed.       Family History  Problem Relation Age of Onset  . Breast cancer Mother   . Breast cancer  Maternal Grandmother    History   Social History  . Marital Status: Married    Spouse Name: N/A    Number of Children: N/A  . Years of Education: N/A   Social History Main Topics  . Smoking status: Former Smoker    Types: Cigarettes    Quit date: 02/05/2012  . Smokeless tobacco: None   Comment: states she has already quit smoking  . Alcohol Use: No  . Drug Use: No  . Sexually Active: Not Currently    Birth Control/ Protection: Post-menopausal   Other Topics Concern  . None   Social History Narrative   lives in [place] with her husband. Has one son who is 62 years old. Used to work in [employer], on disability now. Has Medicare.  Smokes 2 packs per day for past 40 years. Has not had a cigarette last 3 weeks since she got sick. Never drank alcohol. Never did  drugs.   Review of Systems: See HPI  Objective:  Physical Exam: Filed Vitals:   02/22/12 1041  BP: 123/82  Pulse: 88  Temp: 97 F (36.1 C)  TempSrc: Oral  Height: 5\' 8"  (1.727 m)  Weight: 306 lb 8 oz (139.027 kg)  SpO2: 94%  morbid obesity General: alert, well-developed, and cooperative to examination.  Head: normocephalic and atraumatic.  Eyes: vision grossly intact, pupils equal, pupils round, pupils reactive to light, no injection and anicteric.  Mouth: pharynx pink and moist, no erythema, and no exudates. Right sided parotid gland orifice redness and swelling noted.  Neck: supple, full ROM, no thyromegaly, no JVD, and no carotid bruits.  Lungs: normal respiratory effort, no accessory muscle use, normal breath sounds, no crackles, and no wheezes. Heart: normal rate, regular rhythm, no murmur, no gallop, and no rub.  Abdomen: soft, non-tender, normal bowel sounds, no distention, no guarding, no rebound tenderness, no hepatomegaly, and no splenomegaly.  Msk: no joint swelling, no joint warmth, and no redness over joints.  Pulses: 2+ DP/PT pulses bilaterally Extremities: No cyanosis, clubbing, edema Neurologic: alert & oriented X3, cranial nerves II-XII intact, strength normal in all extremities, sensation intact to light touch, and gait normal.  Skin: turgor normal and no rashes.  Psych: Oriented X3, memory intact for recent and remote, normally interactive, good eye contact, not anxious appearing, and not depressed appearing. Assessment & Plan:

## 2012-02-22 NOTE — Patient Instructions (Signed)
1. Follow up with me on 4/26 2. Stop your Biaxin 3. Add Doxycyline

## 2012-02-23 ENCOUNTER — Encounter: Payer: Self-pay | Admitting: Internal Medicine

## 2012-03-02 ENCOUNTER — Ambulatory Visit (INDEPENDENT_AMBULATORY_CARE_PROVIDER_SITE_OTHER): Payer: Medicare Other | Admitting: Internal Medicine

## 2012-03-02 ENCOUNTER — Other Ambulatory Visit (HOSPITAL_COMMUNITY)
Admission: RE | Admit: 2012-03-02 | Discharge: 2012-03-02 | Disposition: A | Discharge status: Home / Self Care | Payer: Medicare Other | Source: Ambulatory Visit | Attending: Internal Medicine | Admitting: Internal Medicine

## 2012-03-02 ENCOUNTER — Encounter: Payer: Self-pay | Admitting: Internal Medicine

## 2012-03-02 VITALS — BP 129/92 | HR 93 | Temp 97.0°F | Ht 68.0 in | Wt 309.9 lb

## 2012-03-02 DIAGNOSIS — Z124 Encounter for screening for malignant neoplasm of cervix: Secondary | ICD-10-CM

## 2012-03-02 DIAGNOSIS — Z720 Tobacco use: Secondary | ICD-10-CM

## 2012-03-02 DIAGNOSIS — Z1211 Encounter for screening for malignant neoplasm of colon: Secondary | ICD-10-CM

## 2012-03-02 DIAGNOSIS — R109 Unspecified abdominal pain: Secondary | ICD-10-CM | POA: Insufficient documentation

## 2012-03-02 DIAGNOSIS — Z1231 Encounter for screening mammogram for malignant neoplasm of breast: Secondary | ICD-10-CM

## 2012-03-02 DIAGNOSIS — C07 Malignant neoplasm of parotid gland: Secondary | ICD-10-CM

## 2012-03-02 DIAGNOSIS — J441 Chronic obstructive pulmonary disease with (acute) exacerbation: Secondary | ICD-10-CM

## 2012-03-02 DIAGNOSIS — F17200 Nicotine dependence, unspecified, uncomplicated: Secondary | ICD-10-CM

## 2012-03-02 DIAGNOSIS — J40 Bronchitis, not specified as acute or chronic: Secondary | ICD-10-CM

## 2012-03-02 LAB — LIPID PANEL
LDL Cholesterol: 121 mg/dL — ABNORMAL HIGH (ref 0–99)
Triglycerides: 149 mg/dL (ref <150)
VLDL: 30 mg/dL (ref 0–40)

## 2012-03-02 MED ORDER — GUAIFENESIN ER 600 MG PO TB12: 600.0000 mg | ORAL_TABLET | Freq: Two times a day (BID) | ORAL | Status: DC

## 2012-03-02 NOTE — Assessment & Plan Note (Signed)
Well control except some whitish sputum. No wheezing or fever. Mild DOE at her baseline.  - continue current regimen - will add mucinex.

## 2012-03-02 NOTE — Assessment & Plan Note (Addendum)
Etiology is unclear. Will need to r/o GERD vs gastritis vs gall stone especially given her obesity and hx of unhealthy diet   - patient is on PPI - will obtain abdominal U/S

## 2012-03-02 NOTE — Assessment & Plan Note (Signed)
Will check her Lipid panel.

## 2012-03-02 NOTE — Assessment & Plan Note (Signed)
Resolved

## 2012-03-02 NOTE — Patient Instructions (Signed)
1. Follow up with me in 3 months 2. Will call you for appt for colonoscopy, mammogram, abd u/s.

## 2012-03-02 NOTE — Progress Notes (Signed)
Patient ID: [name] [name], female   DOB: [birth year], 56 y.o.   MRN: [id number] Patient ID: [name] [name], female   DOB: [birth year], 56 y.o.   MRN: [id number]  Subjective:   Patient ID: [name] [name] female   DOB: 25-Jul-1956 56 y.o.   MRN: [id number]  HPI: [name] [name] is a 56 y.o.  Patient ID: [name] [name], female   DOB: 1956-04-12, 56 y.o.   MRN: [id number]  Subjective:   Patient ID: [name] [name] female   DOB: 1956/07/03 56 y.o.   MRN: [id number]  HPI: [name] [name] is a 56 y.o. with PMH of recent hospitalization for CAP/COPD exacerbation, COPD and long hx of Tobacco abuse who presents to the clinic for follow up since last office visit.  1. Bronchitis She has had bronchitis which was resolved after treatment of Doxycycline. She states that she is at baseline.  She reports mild cough with whitish sputum. No wheezing or fever. No SOB unless she ambulated over one to two block.      She states that she has been compliant with her COPD treatment.   .2. Tobacco abuse She quit smoking since March. She has been doing well with Zyban. She is very proud of herself. She is till in the process of getting her free Nicotine patch.   3. Parotid gland orifice redness   Resolved.  4. Abdominal pain  C/o transient epigastric sharp pain 2-3 times recently. Her pain always happens 30 minutes after meals, lasts 10-15 secs and goes away on its own. No N/V/jaundice. No heart burn or acid reflux. No chest pain, chest pressure or palpitation.  No other c/o.   5. Health maintenance. - will obtain lipid panel - will perform Pap smear - will send referral for colonoscopy and mammogram.    No headache, fever or chills No chest pain, chest pressure or palpitation No nausea, vomiting, or abdominal pain. No melena, diarrhea or incontinence. No muscle weakness.   Denies depression. No appetite or weight changes.      Past Medical History  Diagnosis Date  . COPD (chronic  obstructive pulmonary disease)     last exacerbation 1 year ago, treated as outpatient  . Shortness of breath   . Recurrent upper respiratory infection (URI)   . Pneumonia   . GERD (gastroesophageal reflux disease)    Current Outpatient Prescriptions  Medication Sig Dispense Refill  . albuterol (PROVENTIL) (5 MG/ML) 0.5% nebulizer solution Take 2.5 mg by nebulization every 6 (six) hours as needed. Wheezing or shortness of breath      . buPROPion (ZYBAN) 150 MG 12 hr tablet Take 150 mg by mouth 2 (two) times daily.      Marland Kitchen doxycycline (VIBRA-TABS) 100 MG tablet Take 1 tablet (100 mg total) by mouth 2 (two) times daily.  20 tablet  0  . guaiFENesin (MUCINEX) 600 MG 12 hr tablet Take 1 tablet (600 mg total) by mouth 2 (two) times daily.  60 tablet  2  . ipratropium (ATROVENT) 0.02 % nebulizer solution Take 0.5 mg by nebulization every 6 (six) hours as needed. Shortness of breath      . pantoprazole (PROTONIX) 40 MG tablet Take 40 mg by mouth daily at 12 noon.      Marland Kitchen zolpidem (AMBIEN) 5 MG tablet Take 5 mg by mouth at bedtime as needed.      . predniSONE (DELTASONE) 20 MG tablet Take 2 tablets (40 mg total) by mouth daily.  10 tablet  0   Family History  Problem Relation Age of Onset  . Breast cancer Mother   . Breast cancer Maternal Grandmother    History   Social History  . Marital Status: Married    Spouse Name: N/A    Number of Children: N/A  . Years of Education: N/A   Social History Main Topics  . Smoking status: Former Smoker    Types: Cigarettes    Quit date: 02/05/2012  . Smokeless tobacco: None   Comment: states she has already quit smoking  . Alcohol Use: No  . Drug Use: No  . Sexually Active: Not Currently    Birth Control/ Protection: Post-menopausal   Other Topics Concern  . None   Social History Narrative   lives in [place] with her husband. Has one son who is 16 years old. Used to work in [employer], on disability now. Has Medicare. Smokes 2 packs per  day for past 40 years. Has not had a cigarette last 3 weeks since she got sick. Never drank alcohol. Never did  drugs.   Review of Systems: See HPI  Objective:  Physical Exam: Filed Vitals:   03/02/12 1116  BP: 129/92  Pulse: 93  Temp: 97 F (36.1 C)  TempSrc: Oral  Height: 5\' 8"  (1.727 m)  Weight: 309 lb 14.4 oz (140.57 kg)  SpO2: 99%  morbid obesity General: alert, well-developed, and cooperative to examination.  Head: normocephalic and atraumatic.  Eyes: vision grossly intact, pupils equal, pupils round, pupils reactive to light, no injection and anicteric.  Mouth: pharynx pink and moist, no erythema, and no exudatesNeck: supple, full ROM, no thyromegaly, no JVD, and no carotid bruits.  Lungs: normal respiratory effort, no accessory muscle use, normal breath sounds, no crackles, and no wheezes. Heart: normal rate, regular rhythm, no murmur, no gallop, and no rub.  Abdomen: soft, non-tender, normal bowel sounds, no distention, no guarding, no rebound tenderness, no hepatomegaly, and no splenomegaly.  Msk: no joint swelling, no joint warmth, and no redness over joints.  Pulses: 2+ DP/PT pulses bilaterally Extremities: No cyanosis, clubbing, edema Neurologic: alert & oriented X3, cranial nerves II-XII intact, strength normal in all extremities, sensation intact to light touch, and gait normal.  Skin: turgor normal and no rashes.  Psych: Oriented X3, memory intact for recent and remote, normally interactive, good eye contact, not anxious appearing, and not depressed appearing. Assessment & Plan:

## 2012-03-09 ENCOUNTER — Encounter: Payer: Self-pay | Admitting: Internal Medicine

## 2012-03-30 ENCOUNTER — Ambulatory Visit (HOSPITAL_COMMUNITY)
Admission: RE | Admit: 2012-03-30 | Discharge: 2012-03-30 | Disposition: A | Discharge status: Home / Self Care | Payer: Medicare Other | Source: Ambulatory Visit | Attending: Internal Medicine | Admitting: Internal Medicine

## 2012-03-30 DIAGNOSIS — Z1231 Encounter for screening mammogram for malignant neoplasm of breast: Secondary | ICD-10-CM

## 2012-03-30 DIAGNOSIS — J4489 Other specified chronic obstructive pulmonary disease: Secondary | ICD-10-CM | POA: Insufficient documentation

## 2012-03-30 DIAGNOSIS — J449 Chronic obstructive pulmonary disease, unspecified: Secondary | ICD-10-CM | POA: Insufficient documentation

## 2012-03-30 DIAGNOSIS — J189 Pneumonia, unspecified organism: Secondary | ICD-10-CM

## 2012-03-30 DIAGNOSIS — R059 Cough, unspecified: Secondary | ICD-10-CM | POA: Insufficient documentation

## 2012-03-30 DIAGNOSIS — R1011 Right upper quadrant pain: Secondary | ICD-10-CM | POA: Insufficient documentation

## 2012-03-30 DIAGNOSIS — R05 Cough: Secondary | ICD-10-CM | POA: Insufficient documentation

## 2012-04-05 ENCOUNTER — Telehealth: Payer: Self-pay | Admitting: *Deleted

## 2012-04-05 NOTE — Telephone Encounter (Signed)
No id on phone.  Pt. NOS for previsit.  Sent NOS letter

## 2012-04-06 ENCOUNTER — Telehealth: Payer: Self-pay | Admitting: *Deleted

## 2012-04-06 NOTE — Telephone Encounter (Signed)
Wants results of test - [place] and CXR  from last week. Feeling better and prefers not to come back for FU appt. Wants to talk to Dr Dierdre Searles. Stanton Kidney Tonyetta Berko RN 04/06/12 2PM

## 2012-04-06 NOTE — Progress Notes (Signed)
Addended by: Neomia Dear on: 04/06/2012 07:36 AM   Modules accepted: Orders

## 2012-04-12 ENCOUNTER — Telehealth: Payer: Self-pay | Admitting: Internal Medicine

## 2012-04-12 NOTE — Telephone Encounter (Signed)
Patient called for epigastric abdominal sharp pain. No radiation. No chest pain, chest pressure or or palpitation.  No nausea, vomiting or diarrhea.   No heartburn.  Her abdominal ultrasound shows no acute abnormality on 03/30/12 .  Patient missed her GI appointment for evaluation of her abdomen pain. Patient is recommended to go to urgent care.

## 2012-04-12 NOTE — Telephone Encounter (Signed)
I have called patient and informed her Chest X ray and Abdominal ultrasound result. Patient will continue to follow up with her gastroenterologist.

## 2012-04-16 ENCOUNTER — Encounter: Payer: Self-pay | Admitting: Internal Medicine

## 2012-04-16 ENCOUNTER — Ambulatory Visit (INDEPENDENT_AMBULATORY_CARE_PROVIDER_SITE_OTHER): Payer: Medicare Other | Admitting: Internal Medicine

## 2012-04-16 VITALS — BP 137/86 | HR 94 | Temp 97.0°F | Ht 68.0 in | Wt 307.6 lb

## 2012-04-16 DIAGNOSIS — K219 Gastro-esophageal reflux disease without esophagitis: Secondary | ICD-10-CM

## 2012-04-16 DIAGNOSIS — R109 Unspecified abdominal pain: Secondary | ICD-10-CM

## 2012-04-16 DIAGNOSIS — R220 Localized swelling, mass and lump, head: Secondary | ICD-10-CM

## 2012-04-16 MED ORDER — CYCLOBENZAPRINE HCL 10 MG PO TABS: 10.0000 mg | ORAL_TABLET | Freq: Three times a day (TID) | ORAL | Status: AC | PRN

## 2012-04-16 MED ORDER — PANTOPRAZOLE SODIUM 40 MG PO TBEC: 40.0000 mg | DELAYED_RELEASE_TABLET | Freq: Every day | ORAL | Status: DC

## 2012-04-16 NOTE — Patient Instructions (Signed)
Closely monitor your tongue: Look out for any ulcers, bleeding, new lesion. At that time please call the clinic for further evaluation and management.

## 2012-04-16 NOTE — Assessment & Plan Note (Signed)
Unclear etiology. Recommended the patient to monitor closely. If she is experiencing any ulcerative lesion patient needs to be reevaluated for possible biopsy.

## 2012-04-16 NOTE — Assessment & Plan Note (Signed)
Abdominal cramps unclear etiology. Patient would be seen GI physician in July. I refilled her Protonix 40 mg daily and prescribed Flexeril. Abdominal ultrasound and labs are within normal limits.

## 2012-04-16 NOTE — Progress Notes (Signed)
Subjective:   Patient ID: [name] [name] female   DOB: [birth year] 56 y.o.   MRN: [id number]  HPI: [name] [name] is a 57 y.o. female with past medical history significant as outlined below who presented to the clinic with abdominal cramps and nodule on her tongue.  1. Abdomional cramps: Patient reports that she has been having abdominal cramps since 2 months. It is located in the epigastric area. There are no aggravating or alleviating factors. It lasts for maximal 15 seconds and just goes away. It is not associated with any symptoms including nausea, vomiting, constipation, diarrhea, urinary symptoms, vaginal discharge. Patient denies any trauma, fevers or chills, changes in diet, recent travel. Denies any dark stool. Patient noted that she is experiencing a lot of stress at home since her son is going through a very difficult situation: His wife left him and took  his children, he is in the process to lose his house and has significant financial problem.    Patient lives with her husband who is supporting her as much as possible.   2. Tongue nodule: This has been present since 1 week. Denies any bleeding, ulcer, oral thrush, or any other lesion. Denies any fevers or chills. Denies any weight loss. She quit smoking 3 months ago. Does not drink alcohol or chew  Tobacco.      Past Medical History  Diagnosis Date  . COPD (chronic obstructive pulmonary disease)     last exacerbation 1 year ago, treated as outpatient  . Shortness of breath   . Recurrent upper respiratory infection (URI)   . Pneumonia   . GERD (gastroesophageal reflux disease)    Current Outpatient Prescriptions  Medication Sig Dispense Refill  . albuterol (PROVENTIL) (5 MG/ML) 0.5% nebulizer solution Take 2.5 mg by nebulization every 6 (six) hours as needed. Wheezing or shortness of breath      . guaiFENesin (MUCINEX) 600 MG 12 hr tablet Take 1 tablet (600 mg total) by mouth 2 (two) times daily.  60 tablet  2  .  ipratropium (ATROVENT) 0.02 % nebulizer solution Take 0.5 mg by nebulization every 6 (six) hours as needed. Shortness of breath      . pantoprazole (PROTONIX) 40 MG tablet Take 40 mg by mouth daily at 12 noon.      . predniSONE (DELTASONE) 20 MG tablet Take 2 tablets (40 mg total) by mouth daily.  10 tablet  0  . zolpidem (AMBIEN) 5 MG tablet Take 5 mg by mouth at bedtime as needed.       Family History  Problem Relation Age of Onset  . Breast cancer Mother   . Breast cancer Maternal Grandmother    History   Social History  . Marital Status: Married    Spouse Name: N/A    Number of Children: N/A  . Years of Education: N/A   Social History Main Topics  . Smoking status: Former Smoker    Types: Cigarettes    Quit date: 02/05/2012  . Smokeless tobacco: None   Comment: states she has already quit smoking  . Alcohol Use: No  . Drug Use: No  . Sexually Active: Not Currently    Birth Control/ Protection: Post-menopausal   Other Topics Concern  . None   Social History Narrative   lives in [place] with her husband. Has one son who is 73 years old. Used to work in [employer], on disability now. Has Medicare. Smokes 2 packs per day for past 40 years. Has  not had a cigarette last 3 weeks since she got sick. Never drank alcohol. Never did  drugs.   Review of Systems: Bold if positive.  Constitutional: Fever, chills, diaphoresis, appetite change and fatigue.  HEENT: congestion, sore throat, trouble swallowing, neck pain, neck stiffness and tinnitus.   Respiratory: SOB, DOE, cough, chest tightness,  and wheezing.   Cardiovascular: chest pain, palpitations and leg swelling.  Gastrointestinal: nausea, vomiting,  diarrhea, constipation, blood in stool and abdominal distention.  Genitourinary:  dysuria, urgency, frequency, hematuria, flank pain and difficulty urinating.  Skin: Denies pallor, rash and wound.    Objective:  Physical Exam: Filed Vitals:   04/16/12 1515  BP: 137/86    Pulse: 94  Temp: 97 F (36.1 C)  TempSrc: Oral  Height: 5\' 8"  (1.727 m)  Weight: 307 lb 9.6 oz (139.526 kg)  SpO2: 94%   Constitutional: Vital signs reviewed.  Patient is a well-developed and well-nourished woman in no acute distress and cooperative with exam. Alert and oriented x3.  Mouth: firm nodule on the anterior aspect of the tongue. No redness, edema, ulcer or any other lesion notable. Patient is wearing dentures. Mucous membranes moist. Neck: Supple, Trachea midline normal ROM,mass,  Cardiovascular: RRR, S1 normal, S2 normal, no MRG, pulses symmetric and intact bilaterally Pulmonary/Chest: CTAB, no wheezes, rales, or rhonchi Abdominal: Soft. Non-tender, non-distended, bowel sounds are normal, no masses,  or guarding present.  Hematology: no cervical Neurological: A&O x3,  Skin: Warm, dry and intact. No rash, cyanosis, or clubbing.

## 2012-04-17 ENCOUNTER — Encounter: Payer: Medicare Other | Admitting: Internal Medicine

## 2012-05-17 ENCOUNTER — Ambulatory Visit (AMBULATORY_SURGERY_CENTER): Payer: Medicare Other

## 2012-05-17 VITALS — Ht 68.0 in | Wt 303.3 lb

## 2012-05-17 DIAGNOSIS — Z1211 Encounter for screening for malignant neoplasm of colon: Secondary | ICD-10-CM

## 2012-05-17 DIAGNOSIS — R109 Unspecified abdominal pain: Secondary | ICD-10-CM

## 2012-05-17 MED ORDER — MOVIPREP 100 G PO SOLR: ORAL | Status: DC

## 2012-05-24 ENCOUNTER — Encounter: Payer: Medicare Other | Admitting: Internal Medicine

## 2012-05-31 ENCOUNTER — Encounter: Payer: Self-pay | Admitting: Internal Medicine

## 2012-05-31 ENCOUNTER — Ambulatory Visit (AMBULATORY_SURGERY_CENTER): Payer: Medicare Other | Admitting: Internal Medicine

## 2012-05-31 VITALS — BP 131/90 | HR 88 | Temp 96.0°F | Resp 26 | Ht 68.0 in | Wt 303.0 lb

## 2012-05-31 DIAGNOSIS — D126 Benign neoplasm of colon, unspecified: Secondary | ICD-10-CM

## 2012-05-31 DIAGNOSIS — Z1211 Encounter for screening for malignant neoplasm of colon: Secondary | ICD-10-CM

## 2012-05-31 DIAGNOSIS — K635 Polyp of colon: Secondary | ICD-10-CM

## 2012-05-31 MED ORDER — SODIUM CHLORIDE 0.9 % IV SOLN: 500.0000 mL | INTRAVENOUS | Status: DC

## 2012-05-31 NOTE — Progress Notes (Signed)
Pt. Passing gas and feeling much better.  Dr. Rhea Belton in to speak with pt.

## 2012-05-31 NOTE — Patient Instructions (Addendum)

## 2012-05-31 NOTE — Progress Notes (Signed)
Patient did not experience any of the following events: a burn prior to discharge; a fall within the facility; wrong site/side/patient/procedure/implant event; or a hospital transfer or hospital admission upon discharge from the facility. (G8907)  

## 2012-05-31 NOTE — Progress Notes (Signed)
Pt. Presents to recovery very restless with complaints of gas pain.  Dr. Rhea Belton in.  Pt  Passed large amt of gas but continued to Complain of gas pain and exhibit restlessnes.  Pt. Advised by Dr. Rhea Belton to turn to right side.  Will give levsin for gas discomfort.

## 2012-05-31 NOTE — Op Note (Signed)
[facility] [address] [place], [place]  [address]  COLONOSCOPY PROCEDURE REPORT  PATIENT:  [name] [name], [name] [name]  MR#:  [id number] BIRTHDATE:  [birth year], 55 yrs. old  GENDER:  female ENDOSCOPIST:  Carie Caddy. Ayiden Milliman, MD REF. BY:  Dede Query, MD PROCEDURE DATE:  05/31/2012 PROCEDURE:  Colon with cold biopsy polypectomy ASA CLASS:  Class III INDICATIONS:  Routine Risk Screening, 1st colonoscopy MEDICATIONS:   MAC sedation, administered by CRNA, propofol (Diprivan) 400 mg IV  DESCRIPTION OF PROCEDURE:   After the risks benefits and alternatives of the procedure were thoroughly explained, informed consent was obtained.  Digital rectal exam was performed and revealed no rectal masses.   The LB CF-[id number] endoscope was introduced through the anus and advanced to the cecum, which was identified by both the appendix and ileocecal valve, without limitations.  The quality of the prep was poor, using MoviPrep. The instrument was then slowly withdrawn as the colon was fully examined. <<PROCEDUREIMAGES>> FINDINGS:  Poor prep limited this examination in the right and left colon.  Copious irrigation and lavage performed. Three sessile polyps, 2 -3 mm, were found in the recto-sigmoid colon. The polyps were removed using cold biopsy forceps.  Mild diverticulosis was found in the sigmoid colon.   Retroflexed views in the rectum revealed no abnormalities. The scope was then withdrawn from the cecum and the procedure completed.  COMPLICATIONS:  None  ENDOSCOPIC IMPRESSION: 1) Poor prep (exam limited) 2) Three polyps in the recto-sigmoid colon. Removed and sent to pathology. 3) Mild diverticulosis in the sigmoid colon  RECOMMENDATIONS: 1) Await pathology results 2) High fiber diet. 3) Repeat colonoscopy within 1 year with 2 day preparation given quality of preparation for today's examination and limited views.   Carie Caddy. Rhea Belton, MD  CC:  Dede Query MD The Patient  n. eSIGNEDCarie Caddy. Lariyah Shetterly at 05/31/2012 09:40 AM  [place], Darl Pikes, [phone]

## 2012-06-01 ENCOUNTER — Telehealth: Payer: Self-pay | Admitting: *Deleted

## 2012-06-01 NOTE — Telephone Encounter (Signed)
  Follow up Call-  Call back number 05/31/2012  Post procedure Call Back phone  # [phone]  Permission to leave phone message Yes     Patient questions:  Do you have a fever, pain , or abdominal swelling? no Pain Score  0 *  Have you tolerated food without any problems? yes  Have you been able to return to your normal activities? yes  Do you have any questions about your discharge instructions: Diet   no Medications  no Follow up visit  no  Do you have questions or concerns about your Care? no  Actions: * If pain score is 4 or above: No action needed, pain <4.

## 2012-06-06 ENCOUNTER — Encounter: Payer: Self-pay | Admitting: Internal Medicine

## 2012-06-13 ENCOUNTER — Encounter: Payer: Self-pay | Admitting: Internal Medicine

## 2012-06-13 DIAGNOSIS — K635 Polyp of colon: Secondary | ICD-10-CM | POA: Insufficient documentation

## 2012-06-28 ENCOUNTER — Encounter: Payer: Self-pay | Admitting: Internal Medicine

## 2012-06-28 ENCOUNTER — Ambulatory Visit (INDEPENDENT_AMBULATORY_CARE_PROVIDER_SITE_OTHER): Payer: Medicare Other | Admitting: Internal Medicine

## 2012-06-28 ENCOUNTER — Ambulatory Visit (HOSPITAL_COMMUNITY)
Admission: RE | Admit: 2012-06-28 | Discharge: 2012-06-28 | Disposition: A | Discharge status: Home / Self Care | Payer: Medicare Other | Source: Ambulatory Visit | Attending: Internal Medicine | Admitting: Internal Medicine

## 2012-06-28 VITALS — BP 113/71 | HR 100 | Temp 97.7°F | Wt 303.3 lb

## 2012-06-28 DIAGNOSIS — R0602 Shortness of breath: Secondary | ICD-10-CM | POA: Insufficient documentation

## 2012-06-28 DIAGNOSIS — F329 Major depressive disorder, single episode, unspecified: Secondary | ICD-10-CM

## 2012-06-28 DIAGNOSIS — J441 Chronic obstructive pulmonary disease with (acute) exacerbation: Secondary | ICD-10-CM

## 2012-06-28 DIAGNOSIS — R05 Cough: Secondary | ICD-10-CM | POA: Insufficient documentation

## 2012-06-28 DIAGNOSIS — F32A Depression, unspecified: Secondary | ICD-10-CM | POA: Insufficient documentation

## 2012-06-28 DIAGNOSIS — R059 Cough, unspecified: Secondary | ICD-10-CM | POA: Insufficient documentation

## 2012-06-28 DIAGNOSIS — J42 Unspecified chronic bronchitis: Secondary | ICD-10-CM | POA: Insufficient documentation

## 2012-06-28 DIAGNOSIS — F3289 Other specified depressive episodes: Secondary | ICD-10-CM

## 2012-06-28 MED ORDER — PREDNISONE 20 MG PO TABS: 40.0000 mg | ORAL_TABLET | Freq: Every day | ORAL | Status: DC

## 2012-06-28 MED ORDER — SULFAMETHOXAZOLE-TMP DS 800-160 MG PO TABS: 1.0000 | ORAL_TABLET | Freq: Two times a day (BID) | ORAL | Status: DC

## 2012-06-28 MED ORDER — ALBUTEROL SULFATE (5 MG/ML) 0.5% IN NEBU
2.5000 mg | INHALATION_SOLUTION | Freq: Once | RESPIRATORY_TRACT | Status: AC
  Administered 2012-06-28: 2.5 mg via RESPIRATORY_TRACT

## 2012-06-28 MED ORDER — BUPROPION HCL ER (XL) 150 MG PO TB24: 150.0000 mg | ORAL_TABLET | Freq: Every day | ORAL | Status: DC

## 2012-06-28 MED ORDER — GUAIFENESIN-DM 100-10 MG/5ML PO SYRP: 5.0000 mL | ORAL_SOLUTION | Freq: Three times a day (TID) | ORAL | Status: DC | PRN

## 2012-06-28 MED ORDER — IPRATROPIUM BROMIDE 0.02 % IN SOLN
0.5000 mg | Freq: Once | RESPIRATORY_TRACT | Status: AC
  Administered 2012-06-28: 0.5 mg via RESPIRATORY_TRACT

## 2012-06-28 MED ORDER — METHYLPREDNISOLONE SODIUM SUCC 125 MG IJ SOLR
125.0000 mg | Freq: Once | INTRAMUSCULAR | Status: AC
  Administered 2012-06-28: 125 mg via INTRAMUSCULAR

## 2012-06-28 NOTE — Addendum Note (Signed)
Addended by: Maura Crandall on: 06/28/2012 12:48 PM   Modules accepted: Orders

## 2012-06-28 NOTE — Assessment & Plan Note (Signed)
Patient reports history of depression and used to be on Zoloft. She has been off the medical treatment for over a year. She reports depressed mood recently and associated stressors from her son's divorce.denies SI/HI.  She would like to try a low dose antidepressant  - Wellbutrin XL 150 mg po daily - follow up in 2 weeks for reevaluation - patient is instructed on side effects of Wellbutrin including possible increased suicidality 1-2 weeks after initiation of treatment. - understand to call 911 is she has SI/HI.

## 2012-06-28 NOTE — Progress Notes (Signed)
Patient ID: [name] [name] female   DOB: 17-May-1956 56 y.o.   MRN: [id number]  HPI: [name] is a 56 y.o. with PMH of recent hospitalization for CAP/COPD exacerbation in March/13, COPD and long hx of Tobacco abuse who presents to the clinic for cough. " I feel that I have pneumonia."  Patient states that she has had progressively increased productive cough with moderate yellowish/greenish sputum and shortness breath for 4 days.  Denies fever or chills. Denies chest pain and pressure. States that her husband had a cold one week ago. No other sick contact. She has been using albuterol and Atrovent Neb every 4 hours as needed with partial relief. She has home O2 and does not feel that she needs O2 during the daytime. She still use O2 at nighttime.   She reports that she has been compliant with her Advair inhaler which fell out of her medication list for unknown reason. She states that her Medicare will not pay for her Inhaler now. She needs financial help with it. She reports Tobacco free since March this year.    No headache, fever or chills No chest pain, chest pressure or palpitation No nausea, vomiting, or abdominal pain. No melena, diarrhea or incontinence. No muscle weakness.   Denies depression. No appetite or weight changes.      Past Medical History  Diagnosis Date  . COPD (chronic obstructive pulmonary disease)     last exacerbation 1 year ago, treated as outpatient  . Shortness of breath   . Recurrent upper respiratory infection (URI)   . Pneumonia   . GERD (gastroesophageal reflux disease)   . Insomnia   . Abdominal spasms     mid   Current Outpatient Prescriptions  Medication Sig Dispense Refill  . albuterol (PROVENTIL) (5 MG/ML) 0.5% nebulizer solution Take 2.5 mg by nebulization every 6 (six) hours as needed. Wheezing or shortness of breath      . ipratropium (ATROVENT) 0.02 % nebulizer solution Take 0.5 mg by nebulization every 6 (six) hours as needed.  Shortness of breath      . traZODone (DESYREL) 100 MG tablet Take 100 mg by mouth at bedtime.      . BUPROBAN 150 MG 12 hr tablet       . cyclobenzaprine (FLEXERIL) 10 MG tablet Take 10 mg by mouth 3 (three) times daily as needed.      . pantoprazole (PROTONIX) 40 MG tablet Take 1 tablet (40 mg total) by mouth daily at 12 noon.  30 tablet  1   Family History  Problem Relation Age of Onset  . Breast cancer Mother   . Breast cancer Maternal Grandmother   . Breast cancer Maternal Aunt    History   Social History  . Marital Status: Married    Spouse Name: N/A    Number of Children: N/A  . Years of Education: N/A   Social History Main Topics  . Smoking status: Former Smoker    Types: Cigarettes    Quit date: 02/05/2012  . Smokeless tobacco: Never Used   Comment: states she has already quit smoking  . Alcohol Use: No  . Drug Use: No  . Sexually Active: Not Currently    Birth Control/ Protection: Post-menopausal   Other Topics Concern  . None   Social History Narrative   lives in [place] with her husband. Has one son who is 49 years old. Used to work in [employer], on disability now. Has Medicare. Smokes 2  packs per day for past 40 years. Has not had a cigarette last 3 weeks since she got sick. Never drank alcohol. Never did  drugs.   Review of Systems: See HPI  Objective:  Physical Exam: Filed Vitals:   06/28/12 1122  BP: 113/71  Pulse: 100  Temp: 97.7 F (36.5 C)  TempSrc: Oral  Weight: 303 lb 4.8 oz (137.576 kg)  morbid obesity General: mild distress due to SOB and cough Head: normocephalic and atraumatic.  Eyes: vision grossly intact, pupils equal, pupils round, pupils reactive to light, no injection and anicteric.  Mouth: pharynx pink and moist, no erythema, and no exudatesNeck: supple, full ROM, no thyromegaly, no JVD, and no carotid bruits.  Lungs: increased respiratory effort, decreased air exchange. B/L lung sounds diminished. Scattered wheezing noted  anterior and posteriorly. No rales or rhonchi. Heart: normal rate, regular rhythm, no murmur, no gallop, and no rub.  Abdomen: soft, non-tender, normal bowel sounds, no distention, no guarding, no rebound tenderness, no hepatomegaly, and no splenomegaly.  Msk: no joint swelling, no joint warmth, and no redness over joints.  Pulses: 2+ DP/PT pulses bilaterally Extremities: No cyanosis, clubbing, edema Neurologic: alert & oriented X3, cranial nerves II-XII intact, strength normal in all extremities, sensation intact to light touch, and gait normal.  Skin: turgor normal and no rashes.  Psych: Oriented X3, memory intact for recent and remote, normally interactive, good eye contact, not anxious appearing, and not depressed appearing. Assessment & Plan:

## 2012-06-28 NOTE — Patient Instructions (Signed)
1. Follow up in 2 weeks 2. Take your Albuterol and Atrovent Nebulizer treatment ecery 2-4 hours as needed. 3. Take your Prednisone 40 mg po daily x 14 days 4. Will give you solumedrol 125 mg IM x 1 now 5. Septra DS 1 po BID x 5 days 6. Will give you Cough medicine 7. Add Wellbutrin 150 mg po daily to your regimen.

## 2012-06-28 NOTE — Assessment & Plan Note (Addendum)
The clinical manifestation is consistent with COPD exacerbation, which is likely 2/2 sick contact. She reports medical compliance with her COPD treatment and Tobacco cessation since March 2013.  - O2 sat 96 % on RA. RR 24 - will obtain stat CXR especially given her last COPD exacerbation associated with CAP in March 2013.>> CXR no indication of acute PNA. - albuterol and Atrovent neb treatment at the clinic - instruct patient to increase the frequency of her Neb treatment to every 2-4 hours as needed at home.   - will give Solumedrol 125 mg IM x 1 today - Prednisone 40 mg po daily x 14 days - will start ABx Septra DS 1 tab po BID x 5 days. - Robitussin DM PRN - SW consult for financial assistance - follow up in 2 weeks

## 2012-06-30 ENCOUNTER — Observation Stay (HOSPITAL_COMMUNITY)
Admission: EM | Admit: 2012-06-30 | Discharge: 2012-06-30 | Disposition: A | Discharge status: Home / Self Care | Payer: Medicare Other | Attending: Emergency Medicine | Admitting: Emergency Medicine

## 2012-06-30 ENCOUNTER — Emergency Department (HOSPITAL_COMMUNITY): Payer: Medicare Other

## 2012-06-30 ENCOUNTER — Observation Stay (HOSPITAL_COMMUNITY): Payer: Medicare Other

## 2012-06-30 ENCOUNTER — Other Ambulatory Visit: Payer: Self-pay

## 2012-06-30 ENCOUNTER — Encounter (HOSPITAL_COMMUNITY): Payer: Self-pay | Admitting: *Deleted

## 2012-06-30 ENCOUNTER — Other Ambulatory Visit (HOSPITAL_COMMUNITY): Payer: Medicare Other

## 2012-06-30 DIAGNOSIS — R05 Cough: Secondary | ICD-10-CM | POA: Insufficient documentation

## 2012-06-30 DIAGNOSIS — G4700 Insomnia, unspecified: Secondary | ICD-10-CM | POA: Insufficient documentation

## 2012-06-30 DIAGNOSIS — F430 Acute stress reaction: Secondary | ICD-10-CM | POA: Insufficient documentation

## 2012-06-30 DIAGNOSIS — R059 Cough, unspecified: Secondary | ICD-10-CM | POA: Insufficient documentation

## 2012-06-30 DIAGNOSIS — Z792 Long term (current) use of antibiotics: Secondary | ICD-10-CM | POA: Insufficient documentation

## 2012-06-30 DIAGNOSIS — IMO0002 Reserved for concepts with insufficient information to code with codable children: Secondary | ICD-10-CM | POA: Insufficient documentation

## 2012-06-30 DIAGNOSIS — J40 Bronchitis, not specified as acute or chronic: Secondary | ICD-10-CM

## 2012-06-30 DIAGNOSIS — J209 Acute bronchitis, unspecified: Secondary | ICD-10-CM | POA: Insufficient documentation

## 2012-06-30 DIAGNOSIS — F411 Generalized anxiety disorder: Secondary | ICD-10-CM | POA: Insufficient documentation

## 2012-06-30 DIAGNOSIS — R0602 Shortness of breath: Principal | ICD-10-CM | POA: Insufficient documentation

## 2012-06-30 DIAGNOSIS — J440 Chronic obstructive pulmonary disease with acute lower respiratory infection: Secondary | ICD-10-CM | POA: Insufficient documentation

## 2012-06-30 DIAGNOSIS — R509 Fever, unspecified: Secondary | ICD-10-CM | POA: Insufficient documentation

## 2012-06-30 LAB — POCT I-STAT, CHEM 8
BUN: 9 mg/dL (ref 6–23)
Calcium, Ion: 1.22 mmol/L (ref 1.12–1.23)
Chloride: 106 mEq/L (ref 96–112)
Creatinine, Ser: 0.8 mg/dL (ref 0.50–1.10)
Glucose, Bld: 174 mg/dL — ABNORMAL HIGH (ref 70–99)
HCT: 42 % (ref 36.0–46.0)
Hemoglobin: 14.3 g/dL (ref 12.0–15.0)
Potassium: 3.3 mEq/L — ABNORMAL LOW (ref 3.5–5.1)
Sodium: 142 mEq/L (ref 135–145)
TCO2: 22 mmol/L (ref 0–100)

## 2012-06-30 LAB — CBC WITH DIFFERENTIAL/PLATELET
Hemoglobin: 14.8 g/dL (ref 12.0–15.0)
Lymphocytes Relative: 20 % (ref 12–46)
Lymphs Abs: 2.2 10*3/uL (ref 0.7–4.0)
MCV: 85.9 fL (ref 78.0–100.0)
Neutrophils Relative %: 73 % (ref 43–77)
Platelets: 179 10*3/uL (ref 150–400)
RBC: 5.03 MIL/uL (ref 3.87–5.11)
WBC: 11 10*3/uL — ABNORMAL HIGH (ref 4.0–10.5)

## 2012-06-30 MED ORDER — ALBUTEROL SULFATE (5 MG/ML) 0.5% IN NEBU
INHALATION_SOLUTION | RESPIRATORY_TRACT | Status: AC
  Filled 2012-06-30: qty 1

## 2012-06-30 MED ORDER — PREDNISONE 10 MG PO TABS: 20.0000 mg | ORAL_TABLET | Freq: Every day | ORAL | Status: DC

## 2012-06-30 MED ORDER — ALPRAZOLAM 0.5 MG PO TABS: 0.5000 mg | ORAL_TABLET | Freq: Every evening | ORAL | Status: DC | PRN

## 2012-06-30 MED ORDER — ALBUTEROL SULFATE (5 MG/ML) 0.5% IN NEBU
2.5000 mg | INHALATION_SOLUTION | RESPIRATORY_TRACT | Status: DC
  Administered 2012-06-30 (×2): 2.5 mg via RESPIRATORY_TRACT
  Filled 2012-06-30 (×2): qty 0.5

## 2012-06-30 MED ORDER — IOHEXOL 350 MG/ML SOLN
100.0000 mL | Freq: Once | INTRAVENOUS | Status: AC | PRN
  Administered 2012-06-30: 100 mL via INTRAVENOUS

## 2012-06-30 MED ORDER — ALPRAZOLAM 0.25 MG PO TABS
0.5000 mg | ORAL_TABLET | Freq: Once | ORAL | Status: AC
  Administered 2012-06-30: 0.5 mg via ORAL
  Filled 2012-06-30: qty 2

## 2012-06-30 MED ORDER — IPRATROPIUM BROMIDE 0.02 % IN SOLN
RESPIRATORY_TRACT | Status: AC
  Filled 2012-06-30: qty 2.5

## 2012-06-30 MED ORDER — IPRATROPIUM BROMIDE 0.02 % IN SOLN
0.5000 mg | RESPIRATORY_TRACT | Status: DC
  Administered 2012-06-30 (×2): 0.5 mg via RESPIRATORY_TRACT
  Filled 2012-06-30 (×2): qty 2.5

## 2012-06-30 NOTE — ED Provider Notes (Signed)
Assumed care in CDU.  Pt awaits Chest CTA to r/o PE.  If neg, will counsel on anxiety, prescribe short course of benzos, and have pt f/u with PCP.    5:27 PM Chest CTA result is unremarkable. No evidence of urinary embolism.  Reassurance given.  I prescribed short course of steroid and short course of Xanax.  Pt to f/u w PCP.  Pt voice understanding and agrees with plan.    Results for orders placed during the hospital encounter of 06/30/12  CBC WITH DIFFERENTIAL      Component Value Range   WBC 11.0 (*) 4.0 - 10.5 K/uL   RBC 5.03  3.87 - 5.11 MIL/uL   Hemoglobin 14.8  12.0 - 15.0 g/dL   HCT 09.8  11.9 - 14.7 %   MCV 85.9  78.0 - 100.0 fL   MCH 29.4  26.0 - 34.0 pg   MCHC 34.3  30.0 - 36.0 g/dL   RDW 82.9  56.2 - 13.0 %   Platelets 179  150 - 400 K/uL   Neutrophils Relative 73  43 - 77 %   Neutro Abs 8.0 (*) 1.7 - 7.7 K/uL   Lymphocytes Relative 20  12 - 46 %   Lymphs Abs 2.2  0.7 - 4.0 K/uL   Monocytes Relative 7  3 - 12 %   Monocytes Absolute 0.8  0.1 - 1.0 K/uL   Eosinophils Relative 1  0 - 5 %   Eosinophils Absolute 0.1  0.0 - 0.7 K/uL   Basophils Relative 0  0 - 1 %   Basophils Absolute 0.0  0.0 - 0.1 K/uL  POCT I-STAT, CHEM 8      Component Value Range   Sodium 142  135 - 145 mEq/L   Potassium 3.3 (*) 3.5 - 5.1 mEq/L   Chloride 106  96 - 112 mEq/L   BUN 9  6 - 23 mg/dL   Creatinine, Ser 8.65  0.50 - 1.10 mg/dL   Glucose, Bld 784 (*) 70 - 99 mg/dL   Calcium, Ion 6.96  2.95 - 1.23 mmol/L   TCO2 22  0 - 100 mmol/L   Hemoglobin 14.3  12.0 - 15.0 g/dL   HCT 28.4  13.2 - 44.0 %  D-DIMER, QUANTITATIVE      Component Value Range   D-Dimer, Quant 0.53 (*) 0.00 - 0.48 ug/mL-FEU   Dg Chest 2 View  06/30/2012  *RADIOLOGY REPORT*  Clinical Data: Shortness of breath  CHEST - 2 VIEW  Comparison: 06/28/2012  Findings: The cardiomediastinal silhouette is unremarkable. Mild peribronchial thickening again noted. There is no evidence of focal airspace disease, pulmonary edema,  suspicious pulmonary nodule/mass, pleural effusion, or pneumothorax. No acute bony abnormalities are identified.  IMPRESSION: No evidence of acute cardiopulmonary disease.  Chronic peribronchial thickening.   Original Report Authenticated By: Rosendo Gros, M.D.    Dg Chest 2 View  06/28/2012  *RADIOLOGY REPORT*  Clinical Data: Shortness of breath and cough.  CHEST - 2 VIEW  Comparison: 03/30/2012.  Findings: The cardiac silhouette, mediastinal and hilar contours are within normal limits and stable.  There are chronic bronchitic type lung changes but no acute pulmonary findings.  No pleural effusion.  The bony thorax is intact.  IMPRESSION: Chronic bronchitic type lung changes but no acute overlying pulmonary process.   Original Report Authenticated By: P. Loralie Champagne, M.D.    Ct Angio Chest W/cm &/or Wo Cm  06/30/2012  *RADIOLOGY REPORT*  Clinical Data: Shortness of breath,  recent pneumonia  CT ANGIOGRAPHY CHEST  Technique:  Multidetector CT imaging of the chest using the standard protocol during bolus administration of intravenous contrast. Multiplanar reconstructed images including MIPs were obtained and reviewed to evaluate the vascular anatomy.  Contrast: OMNIPAQUE IOHEXOL 350 MG/ML SOLN  Comparison: Chest radiograph dated 06/30/2012  Findings: No evidence of pulmonary embolism.  Mild mosaic attenuation.  Lungs are otherwise clear.  No pleural effusion or pneumothorax.  The heart is normal in size.  No pericardial effusion. Atherosclerotic calcifications of the aortic arch.  Small precarinal, subcarinal, and right hilar lymph nodes measuring up to 9 mm short-axis, likely reactive.  The visualized upper abdomen is unremarkable.  Mild degenerative changes of the visualized thoracolumbar spine.  IMPRESSION: No evidence of pulmonary embolism.  No evidence of acute cardiopulmonary disease.   Original Report Authenticated By: Charline Bills, M.D.       Fayrene Helper, PA-C 06/30/12 1737

## 2012-06-30 NOTE — ED Notes (Signed)
Pt reports increased SHOB and tightness this AM . Pt has hx of COPD and anxiety. PT reports a productive cough .

## 2012-06-30 NOTE — ED Provider Notes (Signed)
History     CSN: [id number]  Arrival date & time 06/30/12  1157   First MD Initiated Contact with Patient 06/30/12 1203      Chief Complaint  Patient presents with  . Shortness of Breath    (Consider location/radiation/quality/duration/timing/severity/associated sxs/prior treatment) HPI Comments: 56 y/o female with COPD presents with shortness of breath and cough since Sunday. Cough is productive with green phlegm. Saw her PCP on Thursday who prescribed her bactrim and prednisone for bronchitis, but today she worked herself up while doing laundry and became very anxious, causing her sob to worsen. Admits to experiencing increased anxiety recently and unable to calm herself down. Used her nebulizer with some relief. Admits to chest tightness but denies any chest pain. Has been feeling feverish with chills. Had pneumonia back in march and this feels the same. Quit smoking at that time. Denies any nausea, vomiting, confusion. She is oxygen dependent at night, but has not been using her oxygen until this past Sunday. Denies any recent long car rides or plane rides.  Spouse present after initial examination and he states patient gets anxious and worked up very easily. She cannot handle much stress and states she has been stressed out.  Patient is a 56 y.o. female presenting with shortness of breath. The history is provided by the patient and the spouse.  Shortness of Breath  Associated symptoms include a fever, cough (productive) and shortness of breath. Pertinent negatives include no chest pain.    Past Medical History  Diagnosis Date  . COPD (chronic obstructive pulmonary disease)     last exacerbation 1 year ago, treated as outpatient  . Shortness of breath   . Recurrent upper respiratory infection (URI)   . Pneumonia   . GERD (gastroesophageal reflux disease)   . Insomnia   . Abdominal spasms     mid    Past Surgical History  Procedure Date  . Abdominal hysterectomy   .  Appendectomy     Family History  Problem Relation Age of Onset  . Breast cancer Mother   . Breast cancer Maternal Grandmother   . Breast cancer Maternal Aunt     History  Substance Use Topics  . Smoking status: Former Smoker    Types: Cigarettes    Quit date: 02/05/2012  . Smokeless tobacco: Never Used   Comment: states she has already quit smoking  . Alcohol Use: No    OB History    Grav Para Term Preterm Abortions TAB SAB Ect Mult Living                  Review of Systems  Constitutional: Positive for fever and chills.  Respiratory: Positive for cough (productive), chest tightness and shortness of breath.   Cardiovascular: Negative for chest pain.  Gastrointestinal: Negative for nausea and vomiting.  Psychiatric/Behavioral: Negative for confusion. The patient is nervous/anxious.     Allergies  Ibuprofen  Home Medications   Current Outpatient Rx  Name Route Sig Dispense Refill  . ALBUTEROL SULFATE (5 MG/ML) 0.5% IN NEBU Nebulization Take 2.5 mg by nebulization every 6 (six) hours as needed. Wheezing or shortness of breath    . BUPROPION HCL ER (XL) 150 MG PO TB24 Oral Take 1 tablet (150 mg total) by mouth daily. 30 tablet 0  . FLUTICASONE-SALMETEROL 250-50 MCG/DOSE IN AEPB Inhalation Inhale 1 puff into the lungs every 12 (twelve) hours.    . IPRATROPIUM BROMIDE 0.02 % IN SOLN Nebulization Take 0.5 mg by nebulization  every 6 (six) hours as needed. Shortness of breath    . PREDNISONE 20 MG PO TABS Oral Take 40 mg by mouth daily.    . SULFAMETHOXAZOLE-TMP DS 800-160 MG PO TABS Oral Take 1 tablet by mouth 2 (two) times daily. Take for 10 days. First dose 06/28/2012.    Marland Kitchen TRAZODONE HCL 100 MG PO TABS Oral Take 100 mg by mouth at bedtime.      BP 127/62  Pulse 96  Temp 98.7 F (37.1 C) (Oral)  Resp 28  SpO2 99%  Physical Exam  Constitutional: She is oriented to person, place, and time. She appears well-developed and well-nourished.       100% non-rebreather.  Obese.  HENT:  Head: Normocephalic and atraumatic.  Mouth/Throat: Oropharynx is clear and moist and mucous membranes are normal.  Eyes: Conjunctivae and EOM are normal.  Neck: Neck supple. No JVD present.  Cardiovascular: Regular rhythm and normal heart sounds.  Tachycardia present.   Pulmonary/Chest: Tachypnea noted. She has wheezes (scattered). She has rhonchi (scattered).  Abdominal: Soft. Normal appearance and bowel sounds are normal. There is no tenderness.  Neurological: She is alert and oriented to person, place, and time.  Skin: Skin is warm and dry. She is not diaphoretic.  Psychiatric: Her speech is normal and behavior is normal. Her mood appears anxious.    ED Course  Procedures (including critical care time)   Labs Reviewed  CBC WITH DIFFERENTIAL   Results for orders placed during the hospital encounter of 06/30/12  CBC WITH DIFFERENTIAL      Component Value Range   WBC 11.0 (*) 4.0 - 10.5 K/uL   RBC 5.03  3.87 - 5.11 MIL/uL   Hemoglobin 14.8  12.0 - 15.0 g/dL   HCT 40.9  81.1 - 91.4 %   MCV 85.9  78.0 - 100.0 fL   MCH 29.4  26.0 - 34.0 pg   MCHC 34.3  30.0 - 36.0 g/dL   RDW 78.2  95.6 - 21.3 %   Platelets 179  150 - 400 K/uL   Neutrophils Relative 73  43 - 77 %   Neutro Abs 8.0 (*) 1.7 - 7.7 K/uL   Lymphocytes Relative 20  12 - 46 %   Lymphs Abs 2.2  0.7 - 4.0 K/uL   Monocytes Relative 7  3 - 12 %   Monocytes Absolute 0.8  0.1 - 1.0 K/uL   Eosinophils Relative 1  0 - 5 %   Eosinophils Absolute 0.1  0.0 - 0.7 K/uL   Basophils Relative 0  0 - 1 %   Basophils Absolute 0.0  0.0 - 0.1 K/uL  POCT I-STAT, CHEM 8      Component Value Range   Sodium 142  135 - 145 mEq/L   Potassium 3.3 (*) 3.5 - 5.1 mEq/L   Chloride 106  96 - 112 mEq/L   BUN 9  6 - 23 mg/dL   Creatinine, Ser 0.86  0.50 - 1.10 mg/dL   Glucose, Bld 578 (*) 70 - 99 mg/dL   Calcium, Ion 4.69  6.29 - 1.23 mmol/L   TCO2 22  0 - 100 mmol/L   Hemoglobin 14.3  12.0 - 15.0 g/dL   HCT 52.8  41.3 - 24.4  %  D-DIMER, QUANTITATIVE      Component Value Range   D-Dimer, Quant 0.53 (*) 0.00 - 0.48 ug/mL-FEU    Dg Chest 2 View  06/30/2012  *RADIOLOGY REPORT*  Clinical Data: Shortness of breath  CHEST - 2 VIEW  Comparison: 06/28/2012  Findings: The cardiomediastinal silhouette is unremarkable. Mild peribronchial thickening again noted. There is no evidence of focal airspace disease, pulmonary edema, suspicious pulmonary nodule/mass, pleural effusion, or pneumothorax. No acute bony abnormalities are identified.  IMPRESSION: No evidence of acute cardiopulmonary disease.  Chronic peribronchial thickening.   Original Report Authenticated By: Rosendo Gros, M.D.      No diagnosis found.    MDM  56 y/o female with sob, cough, anxiety. On abx for bronchitis from PCP. Symptoms improved after duo-neb tx, but she still feels anxious. Will order d-dimer due to tachypnea. Xanax given to see if anxiety decreased. 2:00 PM Patient no longer sob after receiving xanax. States she feels less anxious. Advised her to discuss anxiety with her PCP in order to avoid another panic attack. Ordered d-dimer due to tachycardia and tachypnea which came back positive. Will obtain CTA. 3:17 PM Patient moved to CDU to await CTA. Case discussed with Fayrene Helper, PA-C who will take over care of patient at this time. If CTA negative, will discharge with instructions to discuss anxiety with PCP and xanax. Continue abx and prednisone for bronchitis.      Trevor Mace, PA-C 06/30/12 [phone]

## 2012-06-30 NOTE — ED Provider Notes (Signed)
Medical screening examination/treatment/procedure(s) were conducted as a shared visit with non-physician practitioner(s) and myself.  I personally evaluated the patient during the encounter.  98JX with dyspnea. Clinically COPD with wheezing on exam. Suspect anxiety component as well. CXR clear. Pt feeling much better but d-dimer done and minimally elevated. Will CTA to eval for possible PE. If negative then DC home with short course steroids. Pt reports having albuterol/ipratropium/advair already. PRN anxiety meds. outp fu.  Raeford Razor, MD 06/30/12 [phone]

## 2012-07-01 NOTE — ED Provider Notes (Signed)
Medical screening examination/treatment/procedure(s) were conducted as a shared visit with non-physician practitioner(s) and myself.  I personally evaluated the patient during the encounter.  Please see completed note for this encounter.  Raeford Razor, MD 07/01/12 [phone]

## 2012-07-01 NOTE — ED Provider Notes (Signed)
Medical screening examination/treatment/procedure(s) were performed by non-physician practitioner and as supervising physician I was immediately available for consultation/collaboration.  Raeford Razor, MD 07/01/12 [phone]

## 2012-07-02 ENCOUNTER — Inpatient Hospital Stay (HOSPITAL_COMMUNITY)
Admission: AD | Admit: 2012-07-02 | Discharge: 2012-07-05 | DRG: 191 | Disposition: A | Discharge status: Home / Self Care | Payer: Medicare Other | Source: Ambulatory Visit | Attending: Internal Medicine | Admitting: Internal Medicine

## 2012-07-02 ENCOUNTER — Telehealth: Payer: Self-pay | Admitting: *Deleted

## 2012-07-02 ENCOUNTER — Encounter: Payer: Self-pay | Admitting: Internal Medicine

## 2012-07-02 ENCOUNTER — Ambulatory Visit (HOSPITAL_COMMUNITY)
Admission: RE | Admit: 2012-07-02 | Discharge: 2012-07-02 | Disposition: A | Discharge status: Home / Self Care | Payer: Medicare Other | Source: Ambulatory Visit | Attending: Internal Medicine | Admitting: Internal Medicine

## 2012-07-02 ENCOUNTER — Ambulatory Visit (INDEPENDENT_AMBULATORY_CARE_PROVIDER_SITE_OTHER): Payer: Medicare Other | Admitting: Internal Medicine

## 2012-07-02 ENCOUNTER — Encounter (HOSPITAL_COMMUNITY): Payer: Self-pay | Admitting: General Practice

## 2012-07-02 VITALS — BP 127/70 | HR 92 | Temp 96.7°F | Ht 68.0 in | Wt 299.5 lb

## 2012-07-02 DIAGNOSIS — IMO0002 Reserved for concepts with insufficient information to code with codable children: Secondary | ICD-10-CM

## 2012-07-02 DIAGNOSIS — Z87891 Personal history of nicotine dependence: Secondary | ICD-10-CM

## 2012-07-02 DIAGNOSIS — Z79899 Other long term (current) drug therapy: Secondary | ICD-10-CM

## 2012-07-02 DIAGNOSIS — F329 Major depressive disorder, single episode, unspecified: Secondary | ICD-10-CM

## 2012-07-02 DIAGNOSIS — J441 Chronic obstructive pulmonary disease with (acute) exacerbation: Principal | ICD-10-CM | POA: Diagnosis present

## 2012-07-02 DIAGNOSIS — R7309 Other abnormal glucose: Secondary | ICD-10-CM | POA: Diagnosis present

## 2012-07-02 DIAGNOSIS — Z6841 Body Mass Index (BMI) 40.0 and over, adult: Secondary | ICD-10-CM

## 2012-07-02 DIAGNOSIS — F32A Depression, unspecified: Secondary | ICD-10-CM

## 2012-07-02 DIAGNOSIS — F3289 Other specified depressive episodes: Secondary | ICD-10-CM | POA: Diagnosis present

## 2012-07-02 DIAGNOSIS — R0602 Shortness of breath: Secondary | ICD-10-CM

## 2012-07-02 DIAGNOSIS — F411 Generalized anxiety disorder: Secondary | ICD-10-CM | POA: Diagnosis present

## 2012-07-02 DIAGNOSIS — F419 Anxiety disorder, unspecified: Secondary | ICD-10-CM

## 2012-07-02 HISTORY — DX: Depression, unspecified: F32.A

## 2012-07-02 HISTORY — DX: Major depressive disorder, single episode, unspecified: F32.9

## 2012-07-02 LAB — CBC
MCH: 29.2 pg (ref 26.0–34.0)
MCHC: 34.6 g/dL (ref 30.0–36.0)
Platelets: 225 10*3/uL (ref 150–400)
RDW: 14.2 % (ref 11.5–15.5)

## 2012-07-02 LAB — COMPREHENSIVE METABOLIC PANEL
ALT: 18 U/L (ref 0–35)
AST: 21 U/L (ref 0–37)
Calcium: 9.7 mg/dL (ref 8.4–10.5)
GFR calc Af Amer: >90 mL/min (ref >90)
Sodium: 138 mEq/L (ref 135–145)
Total Protein: 7.4 g/dL (ref 6.0–8.3)

## 2012-07-02 MED ORDER — HEPARIN SODIUM (PORCINE) 5000 UNIT/ML IJ SOLN
5000.0000 [IU] | Freq: Three times a day (TID) | INTRAMUSCULAR | Status: DC
  Administered 2012-07-02 – 2012-07-05 (×9): 5000 [IU] via SUBCUTANEOUS
  Filled 2012-07-02 (×12): qty 1

## 2012-07-02 MED ORDER — ONDANSETRON HCL 4 MG/2ML IJ SOLN: 4.0000 mg | Freq: Four times a day (QID) | INTRAMUSCULAR | Status: DC | PRN

## 2012-07-02 MED ORDER — METHYLPREDNISOLONE SODIUM SUCC 125 MG IJ SOLR
125.0000 mg | Freq: Once | INTRAMUSCULAR | Status: AC
  Administered 2012-07-02: 125 mg via INTRAMUSCULAR

## 2012-07-02 MED ORDER — ALBUTEROL SULFATE (5 MG/ML) 0.5% IN NEBU
2.5000 mg | INHALATION_SOLUTION | Freq: Once | RESPIRATORY_TRACT | Status: AC
  Administered 2012-07-02: 2.5 mg via RESPIRATORY_TRACT

## 2012-07-02 MED ORDER — ACETAMINOPHEN 650 MG RE SUPP: 650.0000 mg | Freq: Four times a day (QID) | RECTAL | Status: DC | PRN

## 2012-07-02 MED ORDER — IPRATROPIUM BROMIDE 0.02 % IN SOLN
0.5000 mg | Freq: Once | RESPIRATORY_TRACT | Status: AC
  Administered 2012-07-02: 0.5 mg via RESPIRATORY_TRACT

## 2012-07-02 MED ORDER — ONDANSETRON HCL 4 MG PO TABS: 4.0000 mg | ORAL_TABLET | Freq: Four times a day (QID) | ORAL | Status: DC | PRN

## 2012-07-02 MED ORDER — IPRATROPIUM BROMIDE 0.02 % IN SOLN
0.5000 mg | Freq: Four times a day (QID) | RESPIRATORY_TRACT | Status: DC
  Administered 2012-07-02 – 2012-07-03 (×3): 0.5 mg via RESPIRATORY_TRACT
  Filled 2012-07-02 (×3): qty 2.5

## 2012-07-02 MED ORDER — FLUTICASONE-SALMETEROL 250-50 MCG/DOSE IN AEPB
1.0000 | INHALATION_SPRAY | Freq: Two times a day (BID) | RESPIRATORY_TRACT | Status: DC
  Administered 2012-07-02 – 2012-07-05 (×6): 1 via RESPIRATORY_TRACT
  Filled 2012-07-02: qty 14

## 2012-07-02 MED ORDER — BUPROPION HCL ER (XL) 150 MG PO TB24
150.0000 mg | ORAL_TABLET | Freq: Every day | ORAL | Status: DC
Start: 2012-07-02 — End: 2012-07-05
  Administered 2012-07-02 – 2012-07-05 (×4): 150 mg via ORAL
  Filled 2012-07-02 (×4): qty 1

## 2012-07-02 MED ORDER — ALBUTEROL SULFATE (5 MG/ML) 0.5% IN NEBU
2.5000 mg | INHALATION_SOLUTION | Freq: Four times a day (QID) | RESPIRATORY_TRACT | Status: DC
  Administered 2012-07-02 – 2012-07-03 (×3): 2.5 mg via RESPIRATORY_TRACT
  Filled 2012-07-02 (×3): qty 0.5

## 2012-07-02 MED ORDER — ZOLPIDEM TARTRATE 5 MG PO TABS
5.0000 mg | ORAL_TABLET | Freq: Every evening | ORAL | Status: DC | PRN
  Administered 2012-07-02: 5 mg via ORAL
  Filled 2012-07-02: qty 1

## 2012-07-02 MED ORDER — TRAZODONE HCL 100 MG PO TABS
100.0000 mg | ORAL_TABLET | Freq: Every day | ORAL | Status: DC
  Administered 2012-07-02 – 2012-07-04 (×3): 100 mg via ORAL
  Filled 2012-07-02 (×5): qty 1

## 2012-07-02 MED ORDER — ACETAMINOPHEN 325 MG PO TABS: 650.0000 mg | ORAL_TABLET | Freq: Four times a day (QID) | ORAL | Status: DC | PRN

## 2012-07-02 MED ORDER — ALBUTEROL SULFATE HFA 108 (90 BASE) MCG/ACT IN AERS
2.0000 | INHALATION_SPRAY | Freq: Four times a day (QID) | RESPIRATORY_TRACT | Status: DC
  Administered 2012-07-02: 2 via RESPIRATORY_TRACT
  Filled 2012-07-02: qty 6.7

## 2012-07-02 MED ORDER — DOCUSATE SODIUM 100 MG PO CAPS
100.0000 mg | ORAL_CAPSULE | Freq: Two times a day (BID) | ORAL | Status: DC
  Administered 2012-07-03 – 2012-07-05 (×3): 100 mg via ORAL
  Filled 2012-07-02 (×4): qty 1

## 2012-07-02 MED ORDER — MOXIFLOXACIN HCL IN NACL 400 MG/250ML IV SOLN
400.0000 mg | INTRAVENOUS | Status: DC
  Administered 2012-07-02 – 2012-07-03 (×2): 400 mg via INTRAVENOUS
  Filled 2012-07-02 (×3): qty 250

## 2012-07-02 MED ORDER — METHYLPREDNISOLONE SODIUM SUCC 125 MG IJ SOLR
60.0000 mg | Freq: Four times a day (QID) | INTRAMUSCULAR | Status: DC
  Administered 2012-07-02 – 2012-07-04 (×7): 60 mg via INTRAVENOUS
  Filled 2012-07-02 (×7): qty 0.96
  Filled 2012-07-02: qty 2
  Filled 2012-07-02: qty 0.96
  Filled 2012-07-02: qty 2

## 2012-07-02 NOTE — Telephone Encounter (Signed)
Pt called stating she was seen in ED on 8/24 for SOB ( Bronchitis) and Dx with panic attacks.  She was given #5 xanax and it has worked well for pt. I talked with pt and she is still on Bactrim. She is to f/u up with [place] for evaluation. She is asking for more xanax.  Will see today at 1:15

## 2012-07-02 NOTE — H&P (Signed)
Hospital Admission Note Date: 07/03/2012  Patient name: [name] [name] Medical record number: [id number] Date of birth: 1956/04/18 Age: 56 y.o. Gender: female PCP: Dierdre Searles NA, MD  Medical Service: Internal Medicine Teaching Service  Attending physician:  Dr. Margarito Liner    1st Contact:  Dr. Garald Braver  Pager: [phone] 2nd Contact:  Dr. Tonny Branch  Pager: [phone] After 5 pm or weekends: 1st Contact:      Pager: [phone] 2nd Contact:      Pager: [phone]  Chief Complaint: SOB/cough  History of Present Illness: [name] [name] is a 56 year old woman with past medical history of COPD presents to internal medicine outpatient clinic with complaint of shortness of breath. She reports that her symptoms began about 8 days ago and was treated with 10 days course of Bactrim and prednisone as outpatient on Thursday 8/22.  Her symptoms did not improve; therefore, she called the ambulance on Saturday 8/24.  She was seen in the ED and was told that she has a panic attack and was sent home with Xanax. On 8/26, she presented to the clinic with worsening of her symptoms including dyspnea on exertion (she can walk up to half of a block before getting short of breath) and cough. She reports productive sputum with yellow and green in color and streaks of blood associated with sweating but denies any fever. Recent sick contacts include her husband with a cold. She also reports some chest tightness but denies any chest pain, orthopnea or PND. Currently she is uses oxygen at night and on PRN basis, 2- 3L [place].  She has been using nebulizers at home every 3 hours without relief.    Meds: Medications Prior to Admission  Medication Sig Dispense Refill  . albuterol (PROVENTIL) (5 MG/ML) 0.5% nebulizer solution Take 2.5 mg by nebulization every 6 (six) hours as needed. Wheezing or shortness of breath      . ALPRAZolam (XANAX) 0.5 MG tablet Take 0.5 mg by mouth at bedtime as needed. For anxiety      . buPROPion (WELLBUTRIN XL) 150  MG 24 hr tablet Take 150 mg by mouth daily.      Marland Kitchen ipratropium (ATROVENT) 0.02 % nebulizer solution Take 0.5 mg by nebulization every 6 (six) hours as needed. Shortness of breath      . predniSONE (DELTASONE) 10 MG tablet Take 20 mg by mouth daily.      Marland Kitchen sulfamethoxazole-trimethoprim (BACTRIM DS) 800-160 MG per tablet Take 1 tablet by mouth 2 (two) times daily. Take for 10 days. First dose 06/28/2012.      . traZODone (DESYREL) 100 MG tablet Take 100 mg by mouth at bedtime.        Allergies: Allergies as of 07/02/2012 - Review Complete 07/02/2012  Allergen Reaction Noted  . Ibuprofen Hives and Swelling 02/01/2012   Past Medical History  Diagnosis Date  . COPD (chronic obstructive pulmonary disease)     last exacerbation 1 year ago, treated as outpatient  . Shortness of breath   . Recurrent upper respiratory infection (URI)   . Pneumonia   . GERD (gastroesophageal reflux disease)   . Insomnia   . Abdominal spasms     mid  . Depression    Past Surgical History  Procedure Date  . Abdominal hysterectomy   . Appendectomy    Family History  Problem Relation Age of Onset  . Breast cancer Mother   . Breast cancer Maternal Grandmother   . Breast cancer Maternal Aunt    History  Social History  . Marital Status: Married    Spouse Name: N/A    Number of Children: N/A  . Years of Education: N/A   Occupational History  . Not on file.   Social History Main Topics  . Smoking status: Former Smoker    Types: Cigarettes    Quit date: 02/05/2012  . Smokeless tobacco: Never Used   Comment: states she has already quit smoking  . Alcohol Use: No  . Drug Use: No  . Sexually Active: Not Currently    Birth Control/ Protection: Post-menopausal   Other Topics Concern  . Not on file   Social History Narrative   lives in [place] with her husband. Has one son who is 88 years old. Used to work in [employer], on disability now. Has Medicare. Smokes 2 packs per day for past 40  years. Has not had a cigarette last 3 weeks since she got sick. Never drank alcohol. Never did  drugs.    Review of Systems: Constitutional: Denies fever, chills, +diaphoresis, appetite change and fatigue.  HEENT: Denies photophobia, eye pain, redness, hearing loss, ear pain, congestion, sore throat, rhinorrhea, sneezing, mouth sores, trouble swallowing, neck pain, neck stiffness and tinnitus.  Respiratory: + SOB, + DOE, + cough,+ chest tightness, and wheezing.  Cardiovascular: Denies chest pain, palpitations and leg swelling.  Gastrointestinal: Denies nausea, vomiting, abdominal pain, diarrhea, constipation, blood in stool and abdominal distention.  Genitourinary: Denies dysuria, urgency, frequency, hematuria, flank pain and difficulty urinating.  Musculoskeletal: Denies myalgias, back pain, joint swelling, arthralgias and gait problem.  Skin: Denies pallor, rash and wound.  Neurological: Denies dizziness, seizures, syncope, weakness, light-headedness, numbness and headaches.  Hematological: Denies adenopathy. Easy bruising, personal or family bleeding history  Psychiatric/Behavioral: Denies suicidal ideation, mood changes, confusion, nervousness, sleep disturbance and agitation  Physical Exam: Blood pressure 130/71, pulse 76, temperature 98.2 F (36.8 C), temperature source Oral, resp. rate 18, height 5\' 8"  (1.727 m), weight 294 lb 14.4 oz (133.766 kg), SpO2 92.00%. General: alert, well-developed, tearful at times and cooperative to examination.  Head: normocephalic and atraumatic.  Eyes: vision grossly intact, pupils equal, pupils round, pupils reactive to light, no injection and anicteric.  Mouth: pharynx pink and moist, no erythema, and no exudates.  Neck: supple, full ROM, no thyromegaly, no JVD, and no carotid bruits.  Lungs: normal respiratory effort, no accessory muscle use, coarse breath sounds, no crackles, and expiratory wheezes. Heart: tachycardic, regular rhythm, no murmur, no  gallop, and no rub.  Abdomen: soft, non-tender, normal bowel sounds, no distention, no guarding, no rebound tenderness, no hepatomegaly, and no splenomegaly.  Msk: no joint swelling, no joint warmth, and no redness over joints.  Pulses: 2+ DP/PT pulses bilaterally Extremities: No cyanosis, clubbing, trace edema LE  Neurologic: alert & oriented X3, cranial nerves II-XII intact, strength normal in all extremities, sensation intact to light touch, and gait normal.  Skin: turgor normal and no rashes.  Psych: Oriented X3, memory intact for recent and remote, normally interactive, good eye contact, + anxious appearing and tearful, and not depressed appearing.  Lab results: Basic Metabolic Panel:  Basename 07/03/12 0555 07/02/12 1819  NA 132* 138  K 4.1 4.0  CL 95* 101  CO2 28 25  GLUCOSE 122* 150*  BUN 8 9  CREATININE 0.60 0.62  CALCIUM 8.9 9.7  MG -- --  PHOS -- --   CBC:  Basename 07/02/12 1819 06/30/12 1234 06/30/12 1216  WBC 14.0* -- 11.0*  NEUTROABS -- -- 8.0*  HGB 15.3* 14.3 --  HCT 44.2 42.0 --  MCV 84.4 -- 85.9  PLT 225 -- 179   Imaging results:  Dg Chest 2 View  07/02/2012  *RADIOLOGY REPORT*  Clinical Data: Weakness, shortness of breath and cough.  CHEST - 2 VIEW  Comparison: CT chest and chest radiograph 06/30/2012.  Chest radiograph 02/01/2012.  Findings: Trachea is midline.  Heart size stable.  Mild diffuse interstitial prominence may be somewhat chronic.  No focal airspace opacification.  No pleural fluid.  IMPRESSION: Mild diffuse interstitial prominence may be somewhat chronic. Difficult to exclude mild edema.   Original Report Authenticated By: Reyes Ivan, M.D.     Assessment & Plan by Problem:  A/P: 1. SOB: likely COPD exacerbation, physical exam showed expiratory wheezes and patient desat down to int low 90's.  Patient failed outpatient treatment for COPD exacerbation last week; therefore, will need in patient treatment. She had CTA on 8/24 which did not  show pulmonary embolus and unlikely it is PE in etiology as this has been insidious in onset.  ACS is less likely as she denies any chest pain.   -Admit to med-surg -Get Chest Xray -Will treat with Avelox for possible atypical pna -IV Solumedrol 60mg  q6hrs -Albuterol and Atrovent nebs -Albuterol inhalers -Continue Advair and consider starting spiriva inhalers -Will need repeat PFTs as outpatient -Will get sputum culture -Will get 2D echocardiogram to evaluate hear structure given her SOB and questionable for mild edema and she also does have trace edema on LE. -Ordered Cardiac enzymes -Ordered EKG  2. Depression/Anxiety: Stable, will continue Wellbutrin  DVT ppx: heparin SQ  Signed: Ky Barban 07/03/2012, 10:01 AM

## 2012-07-02 NOTE — Progress Notes (Signed)
Pt taken by W/C with belongings and husband to 6N room 2. Stanton Kidney Trinidi Toppins RN 07/02/12 4:30PM

## 2012-07-02 NOTE — Assessment & Plan Note (Signed)
Patient presents with COPD exacerbation which failed outpatient therapy.  Can not exclude newly developing PNA.  - will give her STAT Albuterol and Atrovent Neb. - SOlumedrol 125 mg IM x 1 - STAT CXR - hospital admission to Med surgical unit - plan discussed with patient and his husband. - upon discharge, she would need to have Advair and Spiriva inhaler along with her ALbuterol and Atrovent Neb. - she will also need SW consult for financial assistance. She knows that she need to discuss with Stanton Kidney about [facility] card application.  - she quit smoking 6 months ago.

## 2012-07-02 NOTE — Patient Instructions (Signed)
Admit to the hospital

## 2012-07-02 NOTE — Progress Notes (Signed)
Subjective:   Patient ID: [name] [name] female   DOB: 08-25-1956 56 y.o.   MRN: [id number]  HPI: [name] is a 56 y.o. woman with PMH of recent hospitalization for CAP/COPD exacerbation in March/13, COPD with home O2 and long hx of Tobacco abuse who presents to the clinic for worsening of cough and shortness breath " I feel that I have pneumonia."   Patient was just evaluated for COPD exacerbation on 06/28/12 and treated with SoluMedrol 125 IM x1, albuterol/Atrovent neb treatment x1, Robitussin DM PRN, prednisone 40 mg daily x 14 d,  Septra DS 1 BID x 5 days.  She reports medical compliance to her medical treatment.  However, she has worsening of cough, wheezing and shortness breath, and went to ED on 06/30/12 and had negative CXR and CTA chest.  She was discharged with Xanax PRN and prednisone.  Patient continues to have worsening of symptoms especially with constant cough and wheezing which affect her sleep and daily activities.  She is here for evaluation  No headache, fever or chills No chest pain, chest pressure or palpitation No nausea, vomiting, or abdominal pain. No melena, diarrhea or incontinence. No muscle weakness. Denies depression. No appetite or weight changes.   Past Medical History  Diagnosis Date  . COPD (chronic obstructive pulmonary disease)     last exacerbation 1 year ago, treated as outpatient  . Shortness of breath   . Recurrent upper respiratory infection (URI)   . Pneumonia   . GERD (gastroesophageal reflux disease)   . Insomnia   . Abdominal spasms     mid   Current Outpatient Prescriptions  Medication Sig Dispense Refill  . albuterol (PROVENTIL) (5 MG/ML) 0.5% nebulizer solution Take 2.5 mg by nebulization every 6 (six) hours as needed. Wheezing or shortness of breath      . ALPRAZolam (XANAX) 0.5 MG tablet Take 1 tablet (0.5 mg total) by mouth at bedtime as needed for anxiety.  5 tablet  0  . buPROPion (WELLBUTRIN XL) 150 MG 24 hr tablet Take 1  tablet (150 mg total) by mouth daily.  30 tablet  0  . Fluticasone-Salmeterol (ADVAIR) 250-50 MCG/DOSE AEPB Inhale 1 puff into the lungs every 12 (twelve) hours.      Marland Kitchen ipratropium (ATROVENT) 0.02 % nebulizer solution Take 0.5 mg by nebulization every 6 (six) hours as needed. Shortness of breath      . predniSONE (DELTASONE) 10 MG tablet Take 2 tablets (20 mg total) by mouth daily.  10 tablet  0  . sulfamethoxazole-trimethoprim (BACTRIM DS) 800-160 MG per tablet Take 1 tablet by mouth 2 (two) times daily. Take for 10 days. First dose 06/28/2012.      . traZODone (DESYREL) 100 MG tablet Take 100 mg by mouth at bedtime.       Family History  Problem Relation Age of Onset  . Breast cancer Mother   . Breast cancer Maternal Grandmother   . Breast cancer Maternal Aunt    History   Social History  . Marital Status: Married    Spouse Name: N/A    Number of Children: N/A  . Years of Education: N/A   Social History Main Topics  . Smoking status: Former Smoker    Types: Cigarettes    Quit date: 02/05/2012  . Smokeless tobacco: Never Used   Comment: states she has already quit smoking  . Alcohol Use: No  . Drug Use: No  . Sexually Active: Not Currently    Birth  Control/ Protection: Post-menopausal   Other Topics Concern  . None   Social History Narrative   lives in [place] with her husband. Has one son who is 26 years old. Used to work in [employer], on disability now. Has Medicare. Smokes 2 packs per day for past 40 years. Has not had a cigarette last 3 weeks since she got sick. Never drank alcohol. Never did  drugs.   Review of Systems: See HPI Objective:  Physical Exam: Filed Vitals:   07/02/12 1326  BP: 127/70  Pulse: 92  Temp: 96.7 F (35.9 C)  TempSrc: Oral  Height: 5\' 8"  (1.727 m)  Weight: 299 lb 8 oz (135.852 kg)  SpO2: 92%  morbid obesity. O2 Sat 91-92% on RA. General: mild to moderate distress due to SOB and cough Head: normocephalic and atraumatic.  Eyes:  vision grossly intact, pupils equal, pupils round, pupils reactive to light, no injection and anicteric.  Mouth: pharynx pink and moist, no erythema, and no exudatesNeck: supple, full ROM, no thyromegaly, no JVD, and no carotid bruits.  Lungs: increased respiratory effort, decreased air exchange.  Moderate to severe diffused expiratory wheezing noted anterior and posteriorly. LLL rales and Rhonchi noted.  Heart: normal rate, regular rhythm, no murmur, no gallop, and no rub.  Abdomen: soft, non-tender, normal bowel sounds, no distention, no guarding, no rebound tenderness, no hepatomegaly, and no splenomegaly.  Msk: no joint swelling, no joint warmth, and no redness over joints.  Pulses: 2+ DP/PT pulses bilaterally Extremities: No cyanosis, clubbing, edema Neurologic: alert & oriented X3, cranial nerves II-XII intact, strength normal in all extremities, sensation intact to light touch, and gait normal.  Skin: turgor normal and no rashes.  Psych: Oriented X3, memory intact for recent and remote, normally interactive, good eye contact, not anxious appearing, and not depressed appearing.   Assessment & Plan:

## 2012-07-03 ENCOUNTER — Telehealth: Payer: Self-pay | Admitting: Licensed Clinical Social Worker

## 2012-07-03 DIAGNOSIS — J441 Chronic obstructive pulmonary disease with (acute) exacerbation: Principal | ICD-10-CM

## 2012-07-03 LAB — CARDIAC PANEL(CRET KIN+CKTOT+MB+TROPI)
CK, MB: 4.7 ng/mL — ABNORMAL HIGH (ref 0.3–4.0)
Relative Index: 3 — ABNORMAL HIGH (ref 0.0–2.5)
Total CK: 148 U/L (ref 7–177)
Troponin I: <0.3 ng/mL (ref <0.30)

## 2012-07-03 LAB — COMPREHENSIVE METABOLIC PANEL
AST: 96 U/L — ABNORMAL HIGH (ref 0–37)
Albumin: 2.5 g/dL — ABNORMAL LOW (ref 3.5–5.2)
Chloride: 95 mEq/L — ABNORMAL LOW (ref 96–112)
Creatinine, Ser: 0.6 mg/dL (ref 0.50–1.10)
Potassium: 4.1 mEq/L (ref 3.5–5.1)
Total Bilirubin: 0.4 mg/dL (ref 0.3–1.2)

## 2012-07-03 LAB — EXPECTORATED SPUTUM ASSESSMENT W GRAM STAIN, RFLX TO RESP C

## 2012-07-03 LAB — HEMOGLOBIN A1C: Mean Plasma Glucose: 134 mg/dL — ABNORMAL HIGH (ref <117)

## 2012-07-03 MED ORDER — ALBUTEROL SULFATE (5 MG/ML) 0.5% IN NEBU
2.5000 mg | INHALATION_SOLUTION | RESPIRATORY_TRACT | Status: DC | PRN
  Administered 2012-07-05: 2.5 mg via RESPIRATORY_TRACT
  Filled 2012-07-03: qty 0.5

## 2012-07-03 MED ORDER — GUAIFENESIN-DM 100-10 MG/5ML PO SYRP
5.0000 mL | ORAL_SOLUTION | ORAL | Status: DC | PRN
Start: 2012-07-03 — End: 2012-07-05
  Administered 2012-07-03 – 2012-07-04 (×2): 5 mL via ORAL
  Filled 2012-07-03 (×2): qty 5

## 2012-07-03 MED ORDER — ALBUTEROL SULFATE (5 MG/ML) 0.5% IN NEBU
2.5000 mg | INHALATION_SOLUTION | Freq: Four times a day (QID) | RESPIRATORY_TRACT | Status: DC
  Administered 2012-07-03 – 2012-07-05 (×9): 2.5 mg via RESPIRATORY_TRACT
  Filled 2012-07-03 (×8): qty 0.5

## 2012-07-03 MED ORDER — IPRATROPIUM BROMIDE 0.02 % IN SOLN
0.5000 mg | Freq: Four times a day (QID) | RESPIRATORY_TRACT | Status: DC
  Administered 2012-07-03 – 2012-07-05 (×8): 0.5 mg via RESPIRATORY_TRACT
  Filled 2012-07-03 (×8): qty 2.5

## 2012-07-03 NOTE — Progress Notes (Signed)
  Echocardiogram 2D Echocardiogram has been performed.  Cathie Beams 07/03/2012, 10:19 AM

## 2012-07-03 NOTE — Telephone Encounter (Signed)
[name] [name] was referred to CSW for prescription assistance for Advair Inhaler as pt states it is not covered by her Medicare D.  CSW placed call to pt and family informed CSW pt is currently admitted.  CSW will need to follow up with pt after d/c and obtain name of her Medicare D plan.

## 2012-07-03 NOTE — H&P (Signed)
Internal Medicine Attending Admission Note Date: 07/03/2012  Patient name: [name] [name] Medical record number: [id number] Date of birth: Sep 06, 1956 Age: 56 y.o. Gender: female  I saw and evaluated the patient and discussed her care with house staff. I reviewed the resident's note and I agree with the resident's findings and plan as documented in the resident's note, with the following additional comments.  Chief Complaint(s): Shortness of breath  History - key components related to admission: Patient is a 56 year old woman with history of COPD, GERD, insomnia, depression, and other problems as outlined in medical history, admitted with complaint of shortness of breath aggravated by minimal exertion.  She was started on a prednisone taper recently as well as Xanax for presumed panic attacks.  Patient also reports productive cough and some chest tightness.  She quit smoking in March of this year.   Physical Exam - key components related to admission:  Filed Vitals:   07/02/12 2235 07/03/12 0245 07/03/12 0519 07/03/12 0822  BP: 128/74  130/71   Pulse: 86  76   Temp: 97.7 F (36.5 C)  98.2 F (36.8 C)   TempSrc:      Resp: 20  18   Height:      Weight:      SpO2: 92% 91% 92% 92%   General: Alert, no distress Lungs: Moderate diffuse expiratory wheezing Heart: Regular; S1-S2, no S3, no S4, no murmurs Abdomen: Bowel sounds present, soft, nontender Extremities: No edema  Lab results:   Basic Metabolic Panel:  Basename 07/03/12 0555 07/02/12 1819  NA 132* 138  K 4.1 4.0  CL 95* 101  CO2 28 25  GLUCOSE 122* 150*  BUN 8 9  CREATININE 0.60 0.62  CALCIUM 8.9 9.7  MG -- --  PHOS -- --   Liver Function Tests:  [facility] 07/03/12 0555 07/02/12 1819  AST 96* 21  ALT 152* 18  ALKPHOS 114 127*  BILITOT 0.4 0.3  PROT 6.3 7.4  ALBUMIN 2.5* 3.8    CBC:  Basename 07/02/12 1819 06/30/12 1234 06/30/12 1216  WBC 14.0* -- 11.0*  NEUTROABS -- -- 8.0*  HGB 15.3* 14.3 --  HCT  44.2 42.0 --  MCV 84.4 -- 85.9  PLT 225 -- 179    D-Dimer:  Basename 06/30/12 1315  DDIMER 0.53*    Hemoglobin A1C:  Basename 07/02/12 1819  HGBA1C 6.3*    Imaging results:  Dg Chest 2 View  07/02/2012  *RADIOLOGY REPORT*  Clinical Data: Weakness, shortness of breath and cough.  CHEST - 2 VIEW  Comparison: CT chest and chest radiograph 06/30/2012.  Chest radiograph 02/01/2012.  Findings: Trachea is midline.  Heart size stable.  Mild diffuse interstitial prominence may be somewhat chronic.  No focal airspace opacification.  No pleural fluid.  IMPRESSION: Mild diffuse interstitial prominence may be somewhat chronic. Difficult to exclude mild edema.   Original Report Authenticated By: Reyes Ivan, M.D.      Assessment & Plan by Problem:  1.  COPD exacerbation.  Patient's presents with shortness of breath with worsening exertional dyspnea, cough, and expiratory wheezing on exam likely due to COPD exacerbation.  She reports that she was on inhaled bronchodilators previously but that her Medicare part D would not cover Spiriva and Advair so she has been out of those medications for more than one month.  Plan is treat with steroids, inhaled bronchodilators, and empiric antibiotics; supplement O2 as needed and follow saturations.  We need to see what bronchodilators are covered by  her insurance and try to make sure that she has adequate treatment when she goes home.  Although a cardiac cause is less likely, I agree with 2-D echocardiogram to assess left ventricular function; a BNP may be helpful as well.  Patient had a chest CT scan done 3 days ago which showed no pulmonary embolism, and I think PE is unlikely.  2.  Depression/anxiety.  Continue Wellbutrin.  If anxiety symptoms persist with adequate treatment of her COPD, consider BuSpar.

## 2012-07-03 NOTE — Progress Notes (Signed)
Subjective: Her cough is persistent  with green/yellow sputum. She has had a headache with cough spells.   She denies N/V/D, chest pain, abdominal pain, dysuria.   Objective: Vital signs in last 24 hours: Filed Vitals:   07/03/12 0519 07/03/12 0822 07/03/12 1209 07/03/12 1319  BP: 130/71   131/71  Pulse: 76   96  Temp: 98.2 F (36.8 C)   97.7 F (36.5 C)  TempSrc:      Resp: 18   18  Height:      Weight:      SpO2: 92% 92% 92% 93%   Weight change:   Intake/Output Summary (Last 24 hours) at 07/03/12 1331 Last data filed at 07/03/12 1318  Gross per 24 hour  Intake    720 ml  Output      0 ml  Net    720 ml   Vitals reviewed. General: walking in the room as I enter, in NAD HEENT: PERRL, EOMI, no scleral icterus Cardiac: RRR, no rubs, murmurs or gallops Pulm: clear to auscultation bilaterally, expiratory wheezes, no rales, or rhonchi Abd: soft, nontender, nondistended, BS present Ext: warm and well perfused, 1+ pitting edema pedal edema Neuro: alert and oriented X3, cranial nerves II-XII grossly intact, strength and sensation to light touch equal in bilateral upper and lower extremities   Lab Results: Basic Metabolic Panel:  Lab 07/03/12 2376 07/02/12 1819  NA 132* 138  K 4.1 4.0  CL 95* 101  CO2 28 25  GLUCOSE 122* 150*  BUN 8 9  CREATININE 0.60 0.62  CALCIUM 8.9 9.7  MG -- --  PHOS -- --   Liver Function Tests:  Lab 07/03/12 0555 07/02/12 1819  AST 96* 21  ALT 152* 18  ALKPHOS 114 127*  BILITOT 0.4 0.3  PROT 6.3 7.4  ALBUMIN 2.5* 3.8   CBC:  Lab 07/02/12 1819 06/30/12 1234 06/30/12 1216  WBC 14.0* -- 11.0*  NEUTROABS -- -- 8.0*  HGB 15.3* 14.3 --  HCT 44.2 42.0 --  MCV 84.4 -- 85.9  PLT 225 -- 179   Cardiac Enzymes:  Lab 07/03/12 1030  CKTOTAL 164  CKMB 4.9*  CKMBINDEX --  TROPONINI <0.30   D-Dimer:  Lab 06/30/12 1315  DDIMER 0.53*   CHemoglobin A1C:  Lab 07/02/12 1819  HGBA1C 6.3*    Micro Results: Recent Results (from the  past 240 hour(s))  CULTURE, EXPECTORATED SPUTUM-ASSESSMENT     Status: Normal   Collection Time   07/03/12  9:36 AM      Component Value Range Status Comment   Specimen Description SPUTUM   Final    Special Requests NONE   Final    Sputum evaluation     Final    Value: MICROSCOPIC FINDINGS SUGGEST THAT THIS SPECIMEN IS NOT REPRESENTATIVE OF LOWER RESPIRATORY SECRETIONS. PLEASE RECOLLECT.     NOTIFIED C.HUCK[place]EE,RN 07/03/12 1047 BY BSLADE   Report Status 07/03/2012 FINAL   Final   CULTURE, EXPECTORATED SPUTUM-ASSESSMENT     Status: Normal   Collection Time   07/03/12 12:40 PM      Component Value Range Status Comment   Specimen Description SPUTUM   Final    Special Requests NONE   Final    Sputum evaluation     Final    Value: THIS SPECIMEN IS ACCEPTABLE. RESPIRATORY CULTURE REPORT TO FOLLOW.   Report Status 07/03/2012 FINAL   Final    Studies/Results: Dg Chest 2 View  07/02/2012  *RADIOLOGY REPORT*  Clinical Data:  Weakness, shortness of breath and cough.  CHEST - 2 VIEW  Comparison: CT chest and chest radiograph 06/30/2012.  Chest radiograph 02/01/2012.  Findings: Trachea is midline.  Heart size stable.  Mild diffuse interstitial prominence may be somewhat chronic.  No focal airspace opacification.  No pleural fluid.  IMPRESSION: Mild diffuse interstitial prominence may be somewhat chronic. Difficult to exclude mild edema.   Original Report Authenticated By: Reyes Ivan, M.D.    Medications: I have reviewed the patient's current medications. Scheduled Meds:    . albuterol  2.5 mg Nebulization QID  . buPROPion  150 mg Oral Daily  . docusate sodium  100 mg Oral BID  . Fluticasone-Salmeterol  1 puff Inhalation BID  . heparin  5,000 Units Subcutaneous Q8H  . ipratropium  0.5 mg Nebulization QID  . methylPREDNISolone (SOLU-MEDROL) injection  60 mg Intravenous Q6H  . moxifloxacin  400 mg Intravenous Q24H  . traZODone  100 mg Oral QHS  . DISCONTD: albuterol  2 puff Inhalation QID    . DISCONTD: albuterol  2.5 mg Nebulization Q6H  . DISCONTD: ipratropium  0.5 mg Nebulization Q6H   Continuous Infusions:  PRN Meds:.acetaminophen, acetaminophen, albuterol, guaiFENesin-dextromethorphan, ondansetron (ZOFRAN) IV, ondansetron, DISCONTD: zolpidem Assessment/Plan:  1. SOB: likely COPD exacerbation, physical exam showed expiratory wheezes and patient desat down to int low 90's. Patient failed outpatient treatment for COPD exacerbation last week; therefore, needed inpatient treatment. She had CTA on 8/24 which did not show pulmonary embolus and unlikely it is PE in etiology as this has been insidious in onset. ACS is less likely as she denies any chest pain. Her chest X-ray shows mild diffuse interstitial prominence may be somewhat chronic, difficult to exclude mild edema. This could be explained by atypical PNA v CHF although she has no history of CHF or heart disease.  -Will treat with Avelox for possible atypical pna  -IV Solumedrol 60mg  q6hrs  -Albuterol and Atrovent nebs  -Albuterol inhalers  -Continue Advair and consider starting spiriva inhalers once she is approaching discharge.   -Will need repeat PFTs as outpatient  -Will get sputum culture  -2D echocardiogram pending -Ordered Cardiac enzymes,CKMB elevated but troponin negative, will cycle.  -Ordered EKG   2. Depression/Anxiety: Stable, will continue Wellbutrin.    DVT ppx: heparin SQ      LOS: 1 day   Ky Barban 07/03/2012, 1:31 PM

## 2012-07-04 LAB — CARDIAC PANEL(CRET KIN+CKTOT+MB+TROPI): Total CK: 127 U/L (ref 7–177)

## 2012-07-04 LAB — BASIC METABOLIC PANEL
BUN: 13 mg/dL (ref 6–23)
Chloride: 101 mEq/L (ref 96–112)
GFR calc Af Amer: >90 mL/min (ref >90)
Potassium: 4.3 mEq/L (ref 3.5–5.1)

## 2012-07-04 LAB — CBC
HCT: 43.6 % (ref 36.0–46.0)
Hemoglobin: 15 g/dL (ref 12.0–15.0)
MCV: 85.3 fL (ref 78.0–100.0)
RBC: 5.11 MIL/uL (ref 3.87–5.11)
WBC: 11.5 10*3/uL — ABNORMAL HIGH (ref 4.0–10.5)

## 2012-07-04 MED ORDER — ZOLPIDEM TARTRATE 5 MG PO TABS
5.0000 mg | ORAL_TABLET | Freq: Every evening | ORAL | Status: DC | PRN
  Administered 2012-07-04: 5 mg via ORAL
  Filled 2012-07-04: qty 1

## 2012-07-04 MED ORDER — PREDNISONE 20 MG PO TABS
40.0000 mg | ORAL_TABLET | Freq: Every day | ORAL | Status: DC
  Administered 2012-07-05: 40 mg via ORAL
  Filled 2012-07-04 (×2): qty 2

## 2012-07-04 MED ORDER — MOXIFLOXACIN HCL 400 MG PO TABS
400.0000 mg | ORAL_TABLET | Freq: Every day | ORAL | Status: DC
  Administered 2012-07-04: 400 mg via ORAL
  Filled 2012-07-04 (×2): qty 1

## 2012-07-04 NOTE — Progress Notes (Signed)
Subjective: She is feeling better today with decreased shortness of breath, air hunger, and anxiety.   She denies headache, snoring, chest pain, abdominal pain, or dysuria.  Objective: Vital signs in last 24 hours: Filed Vitals:   07/03/12 1639 07/03/12 2055 07/03/12 2136 07/04/12 0602  BP:   146/66 130/61  Pulse:   83 82  Temp:   97.5 F (36.4 C) 97.6 F (36.4 C)  TempSrc:   Oral Oral  Resp:   20 20  Height:      Weight:    298 lb 11.2 oz (135.489 kg)  SpO2: 92% 94% 93% 93%   Weight change: 3 lb 12.8 oz (1.724 kg)  Intake/Output Summary (Last 24 hours) at 07/04/12 0833 Last data filed at 07/03/12 1834  Gross per 24 hour  Intake    960 ml  Output      0 ml  Net    960 ml  Vitals reviewed.  General: walking in the room as I enter, in NAD  HEENT:  no scleral icterus  Cardiac: RRR, no rubs, murmurs or gallops  Pulm: clear to auscultation bilaterally, minimum expiratory wheezes, no rales, or rhonchi  Abd: soft, nontender, nondistended, BS present  Ext: warm and well perfused, trace pretibial edema  Neuro: alert and oriented X3, cranial nerves II-XII grossly intact, strength and sensation to light touch equal in bilateral upper and lower extremities  Lab Results: Basic Metabolic Panel:  Lab 07/04/12 1610 07/03/12 0555  NA 136 132*  K 4.3 4.1  CL 101 95*  CO2 26 28  GLUCOSE 170* 122*  BUN 13 8  CREATININE 0.64 0.60  CALCIUM 9.8 8.9  MG -- --  PHOS -- --   Liver Function Tests:  Lab 07/03/12 0555 07/02/12 1819  AST 96* 21  ALT 152* 18  ALKPHOS 114 127*  BILITOT 0.4 0.3  PROT 6.3 7.4  ALBUMIN 2.5* 3.8   CBC:  Lab 07/04/12 0319 07/02/12 1819 06/30/12 1216  WBC 11.5* 14.0* --  NEUTROABS -- -- 8.0*  HGB 15.0 15.3* --  HCT 43.6 44.2 --  MCV 85.3 84.4 --  PLT 178 225 --   Cardiac Enzymes:  Lab 07/04/12 0319 07/03/12 1917 07/03/12 1030  CKTOTAL 127 148 164  CKMB 4.8* 4.7* 4.9*  CKMBINDEX -- -- --  TROPONINI <0.30 <0.30 <0.30   D-Dimer:  Lab 06/30/12  1315  DDIMER 0.53*   Hemoglobin A1C:  Lab 07/02/12 1819  HGBA1C 6.3*    Micro Results: Recent Results (from the past 240 hour(s))  CULTURE, EXPECTORATED SPUTUM-ASSESSMENT     Status: Normal   Collection Time   07/03/12  9:36 AM      Component Value Range Status Comment   Specimen Description SPUTUM   Final    Special Requests NONE   Final    Sputum evaluation     Final    Value: MICROSCOPIC FINDINGS SUGGEST THAT THIS SPECIMEN IS NOT REPRESENTATIVE OF LOWER RESPIRATORY SECRETIONS. PLEASE RECOLLECT.     NOTIFIED C.[place],RN 07/03/12 1047 BY BSLADE   Report Status 07/03/2012 FINAL   Final   CULTURE, EXPECTORATED SPUTUM-ASSESSMENT     Status: Normal   Collection Time   07/03/12 12:40 PM      Component Value Range Status Comment   Specimen Description SPUTUM   Final    Special Requests NONE   Final    Sputum evaluation     Final    Value: THIS SPECIMEN IS ACCEPTABLE. RESPIRATORY CULTURE REPORT TO FOLLOW.  Report Status 07/03/2012 FINAL   Final   CULTURE, RESPIRATORY     Status: Normal (Preliminary result)   Collection Time   07/03/12 12:40 PM      Component Value Range Status Comment   Specimen Description SPUTUM   Final    Special Requests NONE   Final    Gram Stain     Final    Value: RARE WBC PRESENT, PREDOMINANTLY PMN     MODERATE SQUAMOUS EPITHELIAL CELLS PRESENT     FEW GRAM POSITIVE COCCI     IN PAIRS IN CLUSTERS   Culture PENDING   Incomplete    Report Status PENDING   Incomplete    Studies/Results: Dg Chest 2 View  07/02/2012  *RADIOLOGY REPORT*  Clinical Data: Weakness, shortness of breath and cough.  CHEST - 2 VIEW  Comparison: CT chest and chest radiograph 06/30/2012.  Chest radiograph 02/01/2012.  Findings: Trachea is midline.  Heart size stable.  Mild diffuse interstitial prominence may be somewhat chronic.  No focal airspace opacification.  No pleural fluid.  IMPRESSION: Mild diffuse interstitial prominence may be somewhat chronic. Difficult to exclude mild  edema.   Original Report Authenticated By: Reyes Ivan, M.D.    Medications: I have reviewed the patient's current medications. Scheduled Meds:   . albuterol  2.5 mg Nebulization QID  . buPROPion  150 mg Oral Daily  . docusate sodium  100 mg Oral BID  . Fluticasone-Salmeterol  1 puff Inhalation BID  . heparin  5,000 Units Subcutaneous Q8H  . ipratropium  0.5 mg Nebulization QID  . methylPREDNISolone (SOLU-MEDROL) injection  60 mg Intravenous Q6H  . moxifloxacin  400 mg Intravenous Q24H  . traZODone  100 mg Oral QHS   Continuous Infusions:  PRN Meds:.acetaminophen, acetaminophen, albuterol, guaiFENesin-dextromethorphan, ondansetron (ZOFRAN) IV, ondansetron, DISCONTD: zolpidem Assessment/Plan: 1. SOB: likely COPD exacerbation, physical exam showed expiratory wheezes and patient desat down to int low 90's. Patient failed outpatient treatment for COPD exacerbation last week; therefore, needed inpatient treatment. She had CTA on 8/24 which did not show pulmonary embolus and unlikely it is PE in etiology as this has been insidious in onset. ACS is less likely as she denies any chest pain. Her chest X-ray shows mild diffuse interstitial prominence may be somewhat chronic, difficult to exclude mild edema. This could be explained by atypical PNA v CHF although she has no history of CHF or heart disease.  -2D Echo showing normal EF of 55-60%, no diastolic dysfunction.  -Cardiac enzymes,CKMB elevated but troponin negative x3.  -Ordered EKG  -Sputum culture showing G+ cocci in cluster, has been re cultured.  Plan: -Switched Avelox from IV to PO for possible atypical pna  -Discontinued IV Solumedrol 60mg  q6hrs  -Started Prednisone 40mg  PO -Albuterol and Atrovent nebs  -Albuterol inhalers  -Continue Advair and consider starting spiriva inhalers once she is approaching discharge.  -Will need repeat PFTs as outpatient  -Will follow up on sputum culture   2. Depression/Anxiety: Stable, will  continue Wellbutrin.   DVT ppx: heparin SQ    LOS: 2 days   Ky Barban 07/04/2012, 8:33 AM

## 2012-07-04 NOTE — Progress Notes (Signed)
Internal Medicine Attending  Date: 07/04/2012  Patient name: [name] [name] Medical record number: [id number] Date of birth: [birth year] Age: 56 y.o. Gender: female  I saw and evaluated the patient and discussed her care on a.m. rounds with house staff. I reviewed the resident's note by Dr. Garald Braver and I agree with the resident's findings and plans as documented in her note, with the following additional comments.  Patient is breathing much better today, with minimal wheezing on lung exam.  Agree with plan to transition to oral steroids, oral antibiotics, and inhaler regimen

## 2012-07-05 ENCOUNTER — Other Ambulatory Visit: Payer: Self-pay

## 2012-07-05 LAB — BASIC METABOLIC PANEL
BUN: 15 mg/dL (ref 6–23)
CO2: 24 mEq/L (ref 19–32)
Chloride: 103 mEq/L (ref 96–112)
Glucose, Bld: 91 mg/dL (ref 70–99)
Potassium: 3.5 mEq/L (ref 3.5–5.1)

## 2012-07-05 LAB — CBC
HCT: 44.7 % (ref 36.0–46.0)
Hemoglobin: 15.1 g/dL — ABNORMAL HIGH (ref 12.0–15.0)
MCHC: 33.8 g/dL (ref 30.0–36.0)

## 2012-07-05 LAB — CULTURE, RESPIRATORY W GRAM STAIN: Culture: NORMAL

## 2012-07-05 MED ORDER — FLUTICASONE-SALMETEROL 250-50 MCG/DOSE IN AEPB: 1.0000 | INHALATION_SPRAY | Freq: Two times a day (BID) | RESPIRATORY_TRACT | Status: DC

## 2012-07-05 MED ORDER — MOXIFLOXACIN HCL 400 MG PO TABS: 400.0000 mg | ORAL_TABLET | Freq: Every day | ORAL | Status: DC

## 2012-07-05 MED ORDER — TIOTROPIUM BROMIDE MONOHYDRATE 18 MCG IN CAPS: 18.0000 ug | ORAL_CAPSULE | Freq: Every day | RESPIRATORY_TRACT | Status: DC

## 2012-07-05 MED ORDER — GUAIFENESIN-DM 100-10 MG/5ML PO SYRP: 5.0000 mL | ORAL_SOLUTION | ORAL | Status: AC | PRN

## 2012-07-05 MED ORDER — PREDNISONE 10 MG PO TABS: ORAL_TABLET | ORAL | Status: DC

## 2012-07-05 NOTE — Discharge Planning (Signed)
Discharged per w/c to private car with all personal belongings, acomp. By family.  Pt verbalizes home instructions and has copy.

## 2012-07-05 NOTE — Discharge Summary (Signed)
Internal Medicine Teaching [facility] Discharge Note  Name: [name] [name] MRN: [id number] DOB: Feb 08, 1956 56 y.o.  Date of Admission: 07/02/2012  4:32 PM Date of Discharge: 07/05/2012 Attending Physician: Farley Ly, MD  Discharge Diagnosis: Principal Problem:  *COPD exacerbation Active Problems:  Depression  Anxiety  Shortness of breath   Discharge Medications: Medication List  As of 07/05/2012  2:53 PM   STOP taking these medications         ALPRAZolam 0.5 MG tablet      sulfamethoxazole-trimethoprim 800-160 MG per tablet         TAKE these medications         albuterol (5 MG/ML) 0.5% nebulizer solution   Commonly known as: PROVENTIL   Take 2.5 mg by nebulization every 6 (six) hours as needed. Wheezing or shortness of breath      buPROPion 150 MG 24 hr tablet   Commonly known as: WELLBUTRIN XL   Take 150 mg by mouth daily.      Fluticasone-Salmeterol 250-50 MCG/DOSE Aepb   Commonly known as: ADVAIR   Inhale 1 puff into the lungs 2 (two) times daily.      guaiFENesin-dextromethorphan 100-10 MG/5ML syrup   Commonly known as: ROBITUSSIN DM   Take 5 mLs by mouth every 4 (four) hours as needed for cough.      ipratropium 0.02 % nebulizer solution   Commonly known as: ATROVENT   Take 0.5 mg by nebulization every 6 (six) hours as needed. Shortness of breath      moxifloxacin 400 MG tablet   Commonly known as: AVELOX   Take 1 tablet (400 mg total) by mouth daily at 8 pm.      predniSONE 10 MG tablet   Commonly known as: DELTASONE   Take 40mg  daily for 2 days, then 30mg  daily for 3 days, then 20mg  daily for 3 days, and 10mg  daily for four days.      traZODone 100 MG tablet   Commonly known as: DESYREL   Take 100 mg by mouth at bedtime.            Disposition and follow-up:   [name] was discharged from [facility] in Good condition.  At the hospital follow up visit please address :  COPD: She was instructed to  take prednisone tapered in 10 days, to take Avelox for 4 more days (last day 07/08/12), and to continue taking Atrovent, albuterol nebulizer and Advair. She indicated willing to try Spiriva but at this time she will continue Atrovent.  She would benefit from outpatient referral to Pulmonology for repeat LFT's.   Anxiety: She was given short-term prescription Xanax for possible panic attack recently while experiencing "air hunger" but would like to try Buspar in outpatient setting for long-term treatment of her anxiety.   Hyperglycemia: HgA1C 6.3 during this admission,  in context of several treatment courses with steroids in past weeks but certainly at increased risk for DM2. Last LDL 121 on 03/02/12. She would benefit from life-style modifications at this time.   Follow-up Appointments: Follow-up Information    Follow up with PRIBULA,CHRISTOPHER, MD. (Wednesday, at 9:30AM. Please bring your medications with yout)    Contact information:   [address] [place] [address] [phone]         Discharge Orders    Future Appointments: Provider: Department: Dept Phone: Center:   07/11/2012 9:30 AM Leodis Sias, MD [facility] [phone] [facility]  Future Orders Please Complete By Expires   Diet - low sodium heart healthy      Increase activity slowly      Call MD for:  temperature >100.4       Procedures Performed:  Dg Chest 2 View  07/02/2012  *RADIOLOGY REPORT*  Clinical Data: Weakness, shortness of breath and cough.  CHEST - 2 VIEW  Comparison: CT chest and chest radiograph 06/30/2012.  Chest radiograph 02/01/2012.  Findings: Trachea is midline.  Heart size stable.  Mild diffuse interstitial prominence may be somewhat chronic.  No focal airspace opacification.  No pleural fluid.  IMPRESSION: Mild diffuse interstitial prominence may be somewhat chronic. Difficult to exclude mild edema.   Original Report Authenticated By: Reyes Ivan, M.D.    Dg  Chest 2 View  06/30/2012  *RADIOLOGY REPORT*  Clinical Data: Shortness of breath  CHEST - 2 VIEW  Comparison: 06/28/2012  Findings: The cardiomediastinal silhouette is unremarkable. Mild peribronchial thickening again noted. There is no evidence of focal airspace disease, pulmonary edema, suspicious pulmonary nodule/mass, pleural effusion, or pneumothorax. No acute bony abnormalities are identified.  IMPRESSION: No evidence of acute cardiopulmonary disease.  Chronic peribronchial thickening.   Original Report Authenticated By: Rosendo Gros, M.D.    Dg Chest 2 View  06/28/2012  *RADIOLOGY REPORT*  Clinical Data: Shortness of breath and cough.  CHEST - 2 VIEW  Comparison: 03/30/2012.  Findings: The cardiac silhouette, mediastinal and hilar contours are within normal limits and stable.  There are chronic bronchitic type lung changes but no acute pulmonary findings.  No pleural effusion.  The bony thorax is intact.  IMPRESSION: Chronic bronchitic type lung changes but no acute overlying pulmonary process.   Original Report Authenticated By: P. Loralie Champagne, M.D.    Ct Angio Chest W/cm &/or Wo Cm  06/30/2012  *RADIOLOGY REPORT*  Clinical Data: Shortness of breath, recent pneumonia  CT ANGIOGRAPHY CHEST  Technique:  Multidetector CT imaging of the chest using the standard protocol during bolus administration of intravenous contrast. Multiplanar reconstructed images including MIPs were obtained and reviewed to evaluate the vascular anatomy.  Contrast: OMNIPAQUE IOHEXOL 350 MG/ML SOLN  Comparison: Chest radiograph dated 06/30/2012  Findings: No evidence of pulmonary embolism.  Mild mosaic attenuation.  Lungs are otherwise clear.  No pleural effusion or pneumothorax.  The heart is normal in size.  No pericardial effusion. Atherosclerotic calcifications of the aortic arch.  Small precarinal, subcarinal, and right hilar lymph nodes measuring up to 9 mm short-axis, likely reactive.  The visualized upper abdomen  is unremarkable.  Mild degenerative changes of the visualized thoracolumbar spine.  IMPRESSION: No evidence of pulmonary embolism.  No evidence of acute cardiopulmonary disease.   Original Report Authenticated By: Charline Bills, M.D.     2D Echo: 07/03/12 Study Conclusions  Left ventricle: The cavity size was normal. Wall thickness was normal. Systolic function was normal. The estimated ejection fraction was in the range of 55% to 60%. There was an increased relative contribution of atrial contraction to ventricular filling, which may be due to hypovolemia.   Admission HPI:  Mrs. Carta is a 56 year old woman with past medical history of COPD presents to internal medicine outpatient clinic with complaint of shortness of breath. She reports that her symptoms began about 8 days ago and was treated with 10 days course of Bactrim and prednisone as outpatient on Thursday 8/22. Her symptoms did not improve; therefore, she called the ambulance on Saturday 8/24. She was seen in the  ED and was told that she has a panic attack and was sent home with Xanax. On 8/26, she presented to the clinic with worsening of her symptoms including dyspnea on exertion (she can walk up to half of a block before getting short of breath) and cough. She reports productive sputum with yellow and green in color and streaks of blood associated with sweating but denies any fever. Recent sick contacts include her husband with a cold. She also reports some chest tightness but denies any chest pain, orthopnea or PND. Currently she is uses oxygen at night and on PRN basis, 2- 3L [place]. She has been using nebulizers at home every 3 hours without relief.    Hospital Course by problem list:  SOB: This was likely COPD exacerbation, physical exam showed expiratory wheezes and patient desat down to int low 90's. Patient failed outpatient treatment for COPD exacerbation last week; therefore, needed inpatient treatment. She had CTA on 8/24  which did not show pulmonary embolus making PE less likely with this insidious  onset. ACS considered but also less likely as she denied any chest pain with normal EKG. Cardiac enzymes, CKMB elevated but troponin negative x3. Her chest X-ray showed mild diffuse interstitial prominence may be somewhat chronic, difficult to exclude mild edema. This could be explained by atypical PNA v CHF although she has no history of CHF or heart disease. Her 2D Echo showed normal EF of 55-60%, no diastolic dysfunction. She was given high dose solumedrol, and IV Avelox until 8/27.  She also was treated with DuoNebs (Atrovent and Proventil) and Advair. Her symptoms progressively improved. She remained afebrile, with slight elevation of WBC but in context of recent IV steroids. Avelox was switched to PO, and prednisone was initiated on 8/28. Sputum culture showing G+ cocci in cluster, has been re cultured. She will be discharged home with a tapered dose of prednisone and Avelox for 4 more days (to end on 07/08/12)  2. Depression/Anxiety: Stable, we continued home dose of Wellbutrin. Added Ambien 5mg  qHS for insomnia during this hospital admission, which was thought to be secondary to high dose steroid therapy. She was briefly counseled on the need for follow up for her anxiety and the avoidance of benzodiazepines for this problem. She expressed the desire to initiate therapy with Buspar for this problem once her COPD exacerbation had completely improved.   3. Hyperglycemia. She has had hyperglycemia in the past and during this admission. Although this is likely a component of being on steroids off and on for COPD exacerbation, her HbA1C is 6.3. She will benefit from outpatient management of this problem with initiation of anti-hyperglycemics and life-style modification changes, including weight loss counseling.     Discharge Vitals:  BP 127/65  Pulse 74  Temp 97.9 F (36.6 C) (Oral)  Resp 18  Ht 5\' 8"  (1.727 m)  Wt 298 lb  11.2 oz (135.489 kg)  BMI 45.42 kg/m2  SpO2 93%  Discharge Labs:  Results for orders placed during the hospital encounter of 07/02/12 (from the past 24 hour(s))  CBC     Status: Abnormal   Collection Time   07/05/12  7:00 AM      Component Value Range   WBC 12.3 (*) 4.0 - 10.5 K/uL   RBC 5.17 (*) 3.87 - 5.11 MIL/uL   Hemoglobin 15.1 (*) 12.0 - 15.0 g/dL   HCT 78.4  69.6 - 29.5 %   MCV 86.5  78.0 - 100.0 fL   MCH 29.2  26.0 - 34.0 pg   MCHC 33.8  30.0 - 36.0 g/dL   RDW 16.1  09.6 - 04.5 %   Platelets 208  150 - 400 K/uL  BASIC METABOLIC PANEL     Status: Normal   Collection Time   07/05/12  7:00 AM      Component Value Range   Sodium 138  135 - 145 mEq/L   Potassium 3.5  3.5 - 5.1 mEq/L   Chloride 103  96 - 112 mEq/L   CO2 24  19 - 32 mEq/L   Glucose, Bld 91  70 - 99 mg/dL   BUN 15  6 - 23 mg/dL   Creatinine, Ser 4.09  0.50 - 1.10 mg/dL   Calcium 9.4  8.4 - 81.1 mg/dL   GFR calc non Af Amer >90  >90 mL/min   GFR calc Af Amer >90  >90 mL/min    Signed: Sara Chu D 07/05/2012, 2:53 PM   Time Spent on Discharge: 30 minutes

## 2012-07-05 NOTE — Progress Notes (Signed)
Subjective: She is feeling much better today with improved cough. She slept well last night with Ambien. She states that her anxiety is not as bad today.   She denies headache, chest pain, abdominal pain, or dysuria.  Objective: Vital signs in last 24 hours: Filed Vitals:   07/05/12 0616 07/05/12 1208 07/05/12 1209 07/05/12 1307  BP: 118/63   127/65  Pulse: 77   74  Temp: 97.6 F (36.4 C)   97.9 F (36.6 C)  TempSrc: Oral     Resp: 18   18  Height:      Weight:      SpO2: 92% 92% 94% 93%   Weight change:   Intake/Output Summary (Last 24 hours) at 07/05/12 1348 Last data filed at 07/05/12 1300  Gross per 24 hour  Intake    600 ml  Output      0 ml  Net    600 ml   Vitals reviewed.  General: Sitting up in chair, in NAD  HEENT: no scleral icterus  Cardiac: RRR, no rubs, murmurs or gallops  Pulm: clear to auscultation bilaterally, minimum expiratory wheezes, no rales, or rhonchi  Abd: soft, nontender, nondistended, BS present  Ext: warm and well perfused, trace pretibial edema  Neuro: alert and oriented X3, cranial nerves II-XII grossly intact, strength and sensation to light touch equal in bilateral upper and lower extremities  Lab Results: Basic Metabolic Panel:  Lab 07/05/12 1610 07/04/12 0319  NA 138 136  K 3.5 4.3  CL 103 101  CO2 24 26  GLUCOSE 91 170*  BUN 15 13  CREATININE 0.72 0.64  CALCIUM 9.4 9.8  MG -- --  PHOS -- --   Liver Function Tests:  Lab 07/03/12 0555 07/02/12 1819  AST 96* 21  ALT 152* 18  ALKPHOS 114 127*  BILITOT 0.4 0.3  PROT 6.3 7.4  ALBUMIN 2.5* 3.8   CBC:  Lab 07/05/12 0700 07/04/12 0319 06/30/12 1216  WBC 12.3* 11.5* --  NEUTROABS -- -- 8.0*  HGB 15.1* 15.0 --  HCT 44.7 43.6 --  MCV 86.5 85.3 --  PLT 208 178 --   Cardiac Enzymes:  Lab 07/04/12 0319 07/03/12 1917 07/03/12 1030  CKTOTAL 127 148 164  CKMB 4.8* 4.7* 4.9*  CKMBINDEX -- -- --  TROPONINI <0.30 <0.30 <0.30   D-Dimer:  Lab 06/30/12 1315  DDIMER 0.53*    Hemoglobin A1C:  Lab 07/02/12 1819  HGBA1C 6.3*    Micro Results: Recent Results (from the past 240 hour(s))  CULTURE, EXPECTORATED SPUTUM-ASSESSMENT     Status: Normal   Collection Time   07/03/12  9:36 AM      Component Value Range Status Comment   Specimen Description SPUTUM   Final    Special Requests NONE   Final    Sputum evaluation     Final    Value: MICROSCOPIC FINDINGS SUGGEST THAT THIS SPECIMEN IS NOT REPRESENTATIVE OF LOWER RESPIRATORY SECRETIONS. PLEASE RECOLLECT.     NOTIFIED C.[place],RN 07/03/12 1047 BY BSLADE   Report Status 07/03/2012 FINAL   Final   CULTURE, EXPECTORATED SPUTUM-ASSESSMENT     Status: Normal   Collection Time   07/03/12 12:40 PM      Component Value Range Status Comment   Specimen Description SPUTUM   Final    Special Requests NONE   Final    Sputum evaluation     Final    Value: THIS SPECIMEN IS ACCEPTABLE. RESPIRATORY CULTURE REPORT TO FOLLOW.   Report  Status 07/03/2012 FINAL   Final   CULTURE, RESPIRATORY     Status: Normal   Collection Time   07/03/12 12:40 PM      Component Value Range Status Comment   Specimen Description SPUTUM   Final    Special Requests NONE   Final    Gram Stain     Final    Value: RARE WBC PRESENT, PREDOMINANTLY PMN     MODERATE SQUAMOUS EPITHELIAL CELLS PRESENT     FEW GRAM POSITIVE COCCI     IN PAIRS IN CLUSTERS   Culture NORMAL OROPHARYNGEAL FLORA   Final    Report Status 07/05/2012 FINAL   Final    Studies/Results: No results found. Medications: I have reviewed the patient's current medications. Scheduled Meds:    . albuterol  2.5 mg Nebulization QID  . buPROPion  150 mg Oral Daily  . docusate sodium  100 mg Oral BID  . Fluticasone-Salmeterol  1 puff Inhalation BID  . heparin  5,000 Units Subcutaneous Q8H  . ipratropium  0.5 mg Nebulization QID  . moxifloxacin  400 mg Oral Q2000  . predniSONE  40 mg Oral Q breakfast  . traZODone  100 mg Oral QHS   Continuous Infusions:  PRN  Meds:.acetaminophen, acetaminophen, albuterol, guaiFENesin-dextromethorphan, ondansetron (ZOFRAN) IV, ondansetron, zolpidem Assessment/Plan:  1. SOB: likely COPD exacerbation, physical exam showed expiratory wheezes and patient desat down to int low 90's. Patient failed outpatient treatment for COPD exacerbation last week; therefore, needed inpatient treatment. She had CTA on 8/24 which did not show pulmonary embolus and unlikely it is PE in etiology as this has been insidious in onset. ACS is less likely as she denies any chest pain. Her chest X-ray shows mild diffuse interstitial prominence may be somewhat chronic, difficult to exclude mild edema. This could be explained by atypical PNA v CHF although she has no history of CHF or heart disease.  -2D Echo showing normal EF of 55-60%, no diastolic dysfunction.  -Cardiac enzymes, CKMB elevated but troponin negative x3.  -EKG with sinus rhythm, non specific ST changes, unchanged from previous EKG.   -Sputum culture showing G+ cocci in cluster, has been re cultured.  -She continues to be afebrile, slight elevation of WBC but in context of recent IV steroids.  Plan:  -Switched Avelox from IV to PO for possible atypical pna  -Discontinued IV Solumedrol 60mg  q6hrs  -Started Prednisone 40mg  PO on 8/28 -Albuterol and Atrovent nebs  -Albuterol inhalers  -Continue Advair and consider starting spiriva inhalers once she is approaching discharge, possibly today  -Will need repeat PFTs as outpatient  -Will follow up on sputum culture   2. Depression/Anxiety: Stable, will continue Wellbutrin. Added Ambien 5mg  qHS for insomnia during this hospital admission, likely 2/2 steroid tx.   3. Hyperglycemia. Pt has had hyperglycemia in the past and during this admission. Although this is likely a component of being on steroids off and on for COPD exacerbation, her HbA1C is 6.3. She will benefit from outpatient management of this problem with initiation of  anti-hyperglycemics and life-style modification changes, including weight loss counseling.   DVT ppx: heparin SQ   Dispo. She may go home today pending continued improvement of her symptoms.      [name] [name] D 07/05/2012, 1:48 PM

## 2012-07-05 NOTE — Progress Notes (Signed)
Internal Medicine Attending  Date: 07/05/2012  Patient name: [name] [name] Medical record number: [id number] Date of birth: Oct 19, 1956 Age: 56 y.o. Gender: female  I saw and evaluated the patient and discussed her care with house staff. I reviewed the resident's note by Dr. Garald Braver and I agree with the resident's findings and plans as documented in her note.

## 2012-07-06 ENCOUNTER — Telehealth: Payer: Self-pay | Admitting: *Deleted

## 2012-07-06 NOTE — Telephone Encounter (Signed)
Per pt's pharmacy, Avelox would cost >$70. Called Doxycycline 100mg  PO BID for 7 days which is at no cost to pt.   Thanks,   sk

## 2012-07-06 NOTE — Telephone Encounter (Signed)
Pt called stating the antibiotic (Avelox)  that was ordered for her was to expensive. $70 Can you call in something else.  Dr. Garald Braver called and d/c avelox and called in doxycycline 100 mg bid for 7 days. Pt informed and will get med today.

## 2012-07-11 ENCOUNTER — Encounter: Payer: Self-pay | Admitting: Internal Medicine

## 2012-07-11 ENCOUNTER — Telehealth: Payer: Self-pay | Admitting: *Deleted

## 2012-07-11 ENCOUNTER — Encounter: Payer: Self-pay | Admitting: Licensed Clinical Social Worker

## 2012-07-11 ENCOUNTER — Ambulatory Visit (INDEPENDENT_AMBULATORY_CARE_PROVIDER_SITE_OTHER): Payer: Medicare Other | Admitting: Internal Medicine

## 2012-07-11 VITALS — BP 140/95 | HR 88 | Temp 97.0°F | Ht 68.0 in | Wt 298.6 lb

## 2012-07-11 DIAGNOSIS — F411 Generalized anxiety disorder: Secondary | ICD-10-CM

## 2012-07-11 DIAGNOSIS — Z720 Tobacco use: Secondary | ICD-10-CM

## 2012-07-11 DIAGNOSIS — F17200 Nicotine dependence, unspecified, uncomplicated: Secondary | ICD-10-CM

## 2012-07-11 DIAGNOSIS — F419 Anxiety disorder, unspecified: Secondary | ICD-10-CM

## 2012-07-11 DIAGNOSIS — J441 Chronic obstructive pulmonary disease with (acute) exacerbation: Secondary | ICD-10-CM

## 2012-07-11 MED ORDER — TRAZODONE HCL 100 MG PO TABS: 100.0000 mg | ORAL_TABLET | Freq: Every day | ORAL | Status: DC

## 2012-07-11 MED ORDER — ALPRAZOLAM 0.5 MG PO TABS: 0.5000 mg | ORAL_TABLET | Freq: Every day | ORAL | Status: DC | PRN

## 2012-07-11 MED ORDER — CITALOPRAM HYDROBROMIDE 20 MG PO TABS: ORAL_TABLET | ORAL | Status: DC

## 2012-07-11 NOTE — Patient Instructions (Signed)
1.  Stop Wellbutrin  2.  Start Citalopram (Celexa) 20 mg tablets.  Take 1/2 tablet daily for 7 days then 1 tablet daily until your follow up appointment  3.  It is okay to use the Alprazolam 0.5 mg tablets for the panic attacks.  The goal with the Citalopram is to not have to use them!  4.  Talk with our social worker, Ms. Mosetta Putt to help get connected with local mental health providers as well as help getting your Advair.  5.  Continue your other medications as prescribed.  6.  GREAT JOB on quitting smoking.  Keep it up because it is the single best thing for your health you can do.  7.  Follow up in one month to see how you are doing.

## 2012-07-11 NOTE — Telephone Encounter (Signed)
Call from [facility] Pharmacist - drug interaction between Celexa and Trazadone. Talked to Dr Tonny Branch - already awared; ok for pt to take these meds - pharmacist made awared.

## 2012-07-11 NOTE — Progress Notes (Signed)
Subjective:   Patient ID: [name] [name] female   DOB: [birth year] 56 y.o.   MRN: [id number]  HPI: [name] [name] is a 56 y.o. woman who is here for follow up after her hospitalization in the end of August.  She was hospitalized for a COPD exacerbation with a contribution from her chronic anxiety.    She states that her breathing is feeling better. She was able to get the doxycycline and prednisone but hasn't picked up the Advair yet.  She states that she is still having a little cough but its is much improved and she has not had any fever or SOB.  She denies chest pain, abdominal pain, nausea, or vomiting.  She states that since she left the hospital she has not smoked.  She continues to have problems with anxiety.  She states that she is having panic attacks every other day since her discharged.  These have been getting progressively worse over the last few months.  She was started on Wellbutrin in April but states that this has not helped with her anxiety.  She states that the attacks are manifest by "feeling likely I'm going crazy", palpitations, shortness of breath, and sweating.  She denies SI, HI, pressured speech, or hopelessness.  She states that the Trazodone has helped her sleep.   Past Medical History  Diagnosis Date  . COPD (chronic obstructive pulmonary disease)     last exacerbation 1 year ago, treated as outpatient  . Shortness of breath   . Recurrent upper respiratory infection (URI)   . Pneumonia   . GERD (gastroesophageal reflux disease)   . Insomnia   . Abdominal spasms     mid  . Depression    Current Outpatient Prescriptions  Medication Sig Dispense Refill  . albuterol (PROVENTIL) (5 MG/ML) 0.5% nebulizer solution Take 2.5 mg by nebulization every 6 (six) hours as needed. Wheezing or shortness of breath      . buPROPion (WELLBUTRIN XL) 150 MG 24 hr tablet Take 150 mg by mouth daily.      . Fluticasone-Salmeterol (ADVAIR) 250-50 MCG/DOSE AEPB Inhale 1 puff into  the lungs 2 (two) times daily.  60 each  2  . guaiFENesin-dextromethorphan (ROBITUSSIN DM) 100-10 MG/5ML syrup Take 5 mLs by mouth every 4 (four) hours as needed for cough.  118 mL  0  . ipratropium (ATROVENT) 0.02 % nebulizer solution Take 0.5 mg by nebulization every 6 (six) hours as needed. Shortness of breath      . moxifloxacin (AVELOX) 400 MG tablet Take 1 tablet (400 mg total) by mouth daily at 8 pm.  4 tablet  0  . predniSONE (DELTASONE) 10 MG tablet Take 40mg  daily for 2 days, then 30mg  daily for 3 days, then 20mg  daily for 3 days, and 10mg  daily for four days.  30 tablet  0  . traZODone (DESYREL) 100 MG tablet Take 100 mg by mouth at bedtime.       Family History  Problem Relation Age of Onset  . Breast cancer Mother   . Breast cancer Maternal Grandmother   . Breast cancer Maternal Aunt    History   Social History  . Marital Status: Married    Spouse Name: N/A    Number of Children: N/A  . Years of Education: N/A   Social History Main Topics  . Smoking status: Former Smoker    Types: Cigarettes    Quit date: 02/05/2012  . Smokeless tobacco: Never Used   Comment: states  she has already quit smoking  . Alcohol Use: No  . Drug Use: No  . Sexually Active: Not Currently    Birth Control/ Protection: Post-menopausal   Other Topics Concern  . None   Social History Narrative   lives in [place] with her husband. Has one son who is 75 years old. Used to work in [employer], on disability now. Has Medicare. Smokes 2 packs per day for past 40 years. Has not had a cigarette last 3 weeks since she got sick. Never drank alcohol. Never did  drugs.   Review of Systems: Constitutional: Denies fever, chills, diaphoresis, appetite change and fatigue.  HEENT: Denies photophobia, eye pain, redness, hearing loss, ear pain, congestion, sore throat, rhinorrhea, sneezing, mouth sores, trouble swallowing, neck pain, neck stiffness and tinnitus.   Respiratory: Positive for cough. Denies  SOB, DOE, chest tightness,  and wheezing.   Cardiovascular: Positive for palpitations. Denies chest pain, and leg swelling.  Gastrointestinal: Denies nausea, vomiting, abdominal pain, diarrhea, constipation, blood in stool and abdominal distention.  Genitourinary: Denies dysuria, urgency, frequency, hematuria, flank pain and difficulty urinating.  Musculoskeletal: Denies myalgias, back pain, joint swelling, arthralgias and gait problem.  Skin: Denies pallor, rash and wound.  Neurological: Denies dizziness, seizures, syncope, weakness, light-headedness, numbness and headaches.  Hematological: Denies adenopathy. Easy bruising, personal or family bleeding history  Psychiatric/Behavioral: Positive for nervousness. Denies suicidal ideation, mood changes, confusion, sleep disturbance and agitation  Objective:  Physical Exam: Filed Vitals:   07/11/12 0958  BP: 140/95  Pulse: 88  Temp: 97 F (36.1 C)  TempSrc: Oral  Height: 5\' 8"  (1.727 m)  Weight: 298 lb 9.6 oz (135.444 kg)  SpO2: 95%   Constitutional: Vital signs reviewed.  Patient is a well-developed and well-nourished woman in no acute distress and cooperative with exam. Alert and oriented x3.  Head: Normocephalic and atraumatic Ear: TM normal bilaterally Mouth: no erythema or exudates, MMM Eyes: PERRL, EOMI, conjunctivae normal, No scleral icterus.  Neck: Supple, Trachea midline normal ROM, No JVD, mass, thyromegaly, or carotid bruit present.  Cardiovascular: RRR, S1 normal, S2 normal, no MRG, pulses symmetric and intact bilaterally Pulmonary/Chest: CTAB, no wheezes, rales, or rhonchi Abdominal: Soft. Non-tender, non-distended, bowel sounds are normal, no masses, organomegaly, or guarding present.  GU: no CVA tenderness Musculoskeletal: No joint deformities, erythema, or stiffness, ROM full and no nontender Hematology: no cervical, inginal, or axillary adenopathy.  Neurological: A&O x3, Strength is normal and symmetric bilaterally,  cranial nerve II-XII are grossly intact, no focal motor deficit, sensory intact to light touch bilaterally.  Skin: Warm, dry and intact. No rash, cyanosis, or clubbing.  Psychiatric: anxious mood and flat affect. speech and behavior is normal. Judgment and thought content normal. Cognition and memory are normal.   Assessment & Plan:

## 2012-07-12 NOTE — Progress Notes (Signed)
Ms. Sunde was referred to CSW for prescription assistance and mental health referral.  CSW met with pt after her scheduled [facility] appt.  Pt voiced concern that her Advair was not covered under her insurance, Medicare D plan.  Pt states she was d/c from the hospital and received free Advair to cover one month.  Pt provided CSW with Medicare Part D card, CSW able to look formulary online.  Advair is listed on pt's formulary as a preferred drug.  CSW placed call to Ms. Presas's pharmacy, [facility] off [address].  Pharmacy states Advair is covered but needs prior auth first.  CSW provided pharmacy information, copy of Medicare D card to nursing staff that handles prior auth.  Pt aware of above.  CSW provided Ms. Hattabaugh with information to [facility] to obtain information on differing Medicare D plans, as her current plan may not be the best.  CSW also provided Ms. Dwyer with information on Medicare "Extra Help" program which assist Medicare D receipients prescription co-pay. Discussed referrals to mental health providers.  Pt indicates she would like referral to therapist as well as a psychiatrist.  Ms. Mohar has medicare which is accepted at [facility].  Pt agreeable to referral to [facility] on [address].  CSW placed call to [facility], pt referral completed.

## 2012-07-23 ENCOUNTER — Telehealth: Payer: Self-pay | Admitting: Licensed Clinical Social Worker

## 2012-07-23 NOTE — Telephone Encounter (Signed)
Ms. Soman placed call to Triage RN and left message regarding her therapy/psych appt.  CSW returned call to Ms. Lio, received voicemail.  Pt's EMR is showing appt on 08/21/12.  Left message requesting return call to provide pt with information as well as CSW provided Ms. Cockerell the number to [facility] directly.

## 2012-07-24 NOTE — Telephone Encounter (Signed)
Pt returned call to CSW outside of CSW work hours.  CSW returned call and left message providing number to Behavioral Health [phone].  Provided information on CSW work hours.

## 2012-08-06 ENCOUNTER — Other Ambulatory Visit: Payer: Self-pay | Admitting: *Deleted

## 2012-08-06 DIAGNOSIS — F419 Anxiety disorder, unspecified: Secondary | ICD-10-CM

## 2012-08-07 ENCOUNTER — Other Ambulatory Visit: Payer: Self-pay | Admitting: Internal Medicine

## 2012-08-08 NOTE — Telephone Encounter (Signed)
Called to pharm 

## 2012-08-08 NOTE — Telephone Encounter (Signed)
Have tried to call pt, no answer 

## 2012-08-21 ENCOUNTER — Ambulatory Visit (INDEPENDENT_AMBULATORY_CARE_PROVIDER_SITE_OTHER): Payer: Medicare Other | Admitting: Physician Assistant

## 2012-08-21 DIAGNOSIS — F331 Major depressive disorder, recurrent, moderate: Secondary | ICD-10-CM

## 2012-08-21 DIAGNOSIS — F332 Major depressive disorder, recurrent severe without psychotic features: Secondary | ICD-10-CM

## 2012-08-21 DIAGNOSIS — F410 Panic disorder [episodic paroxysmal anxiety] without agoraphobia: Secondary | ICD-10-CM

## 2012-08-21 DIAGNOSIS — F411 Generalized anxiety disorder: Secondary | ICD-10-CM

## 2012-08-21 MED ORDER — SERTRALINE HCL 100 MG PO TABS: ORAL_TABLET | ORAL | Status: DC

## 2012-08-21 MED ORDER — GABAPENTIN 300 MG PO CAPS: 300.0000 mg | ORAL_CAPSULE | Freq: Four times a day (QID) | ORAL | Status: DC

## 2012-09-13 ENCOUNTER — Other Ambulatory Visit: Payer: Self-pay | Admitting: Internal Medicine

## 2012-09-15 ENCOUNTER — Other Ambulatory Visit: Payer: Self-pay | Admitting: Internal Medicine

## 2012-09-15 MED ORDER — CITALOPRAM HYDROBROMIDE 20 MG PO TABS: 20.0000 mg | ORAL_TABLET | Freq: Every day | ORAL | Status: DC

## 2012-09-17 ENCOUNTER — Telehealth (HOSPITAL_COMMUNITY): Payer: Self-pay | Admitting: *Deleted

## 2012-09-17 NOTE — Telephone Encounter (Signed)
Pt states she did not remember she had taken Zoloft in the past (and it did not help) until she picked it up from pharmacy.She does not want to take it.She continued taking the Celexa begun by Dr.Pribula on 9/4,and wants to continue it.

## 2012-09-18 ENCOUNTER — Other Ambulatory Visit (HOSPITAL_COMMUNITY): Payer: Self-pay | Admitting: Physician Assistant

## 2012-09-18 ENCOUNTER — Telehealth (HOSPITAL_COMMUNITY): Payer: Self-pay | Admitting: Physician Assistant

## 2012-09-18 MED ORDER — CITALOPRAM HYDROBROMIDE 20 MG PO TABS: 20.0000 mg | ORAL_TABLET | Freq: Every day | ORAL | Status: DC

## 2012-09-18 NOTE — Telephone Encounter (Signed)
Call patient to discuss her conversation with staff nurse. Patient now recalls having taken Zoloft in the past, and found it to be ineffective. Patient cannot recall dose of Zoloft, or exactly how long she was on it. Patient wants to continue on Celexa. Prescription for Celexa sent to patient's pharmacy

## 2012-09-19 ENCOUNTER — Other Ambulatory Visit: Payer: Self-pay | Admitting: Internal Medicine

## 2012-09-20 ENCOUNTER — Other Ambulatory Visit: Payer: Self-pay | Admitting: *Deleted

## 2012-09-20 NOTE — Telephone Encounter (Signed)
Received refill request from [facility] for Xanax. It was refilled 09/13/12 per Dr Dierdre Searles; unsure if it was called in to the pharmacy. I called in this refill rx to [facility] and informed them if it was not already done.

## 2012-09-22 ENCOUNTER — Emergency Department (HOSPITAL_COMMUNITY): Payer: Medicare Other

## 2012-09-22 ENCOUNTER — Encounter (HOSPITAL_COMMUNITY): Payer: Self-pay | Admitting: Emergency Medicine

## 2012-09-22 ENCOUNTER — Emergency Department (HOSPITAL_COMMUNITY)
Admission: EM | Admit: 2012-09-22 | Discharge: 2012-09-22 | Disposition: A | Discharge status: Home Health | Payer: Medicare Other | Attending: Emergency Medicine | Admitting: Emergency Medicine

## 2012-09-22 DIAGNOSIS — Z87891 Personal history of nicotine dependence: Secondary | ICD-10-CM | POA: Insufficient documentation

## 2012-09-22 DIAGNOSIS — K219 Gastro-esophageal reflux disease without esophagitis: Secondary | ICD-10-CM | POA: Insufficient documentation

## 2012-09-22 DIAGNOSIS — S82143A Displaced bicondylar fracture of unspecified tibia, initial encounter for closed fracture: Secondary | ICD-10-CM

## 2012-09-22 DIAGNOSIS — Y939 Activity, unspecified: Secondary | ICD-10-CM | POA: Insufficient documentation

## 2012-09-22 DIAGNOSIS — J449 Chronic obstructive pulmonary disease, unspecified: Secondary | ICD-10-CM | POA: Insufficient documentation

## 2012-09-22 DIAGNOSIS — G4700 Insomnia, unspecified: Secondary | ICD-10-CM | POA: Insufficient documentation

## 2012-09-22 DIAGNOSIS — S93609A Unspecified sprain of unspecified foot, initial encounter: Secondary | ICD-10-CM

## 2012-09-22 DIAGNOSIS — J4489 Other specified chronic obstructive pulmonary disease: Secondary | ICD-10-CM | POA: Insufficient documentation

## 2012-09-22 DIAGNOSIS — Z8701 Personal history of pneumonia (recurrent): Secondary | ICD-10-CM | POA: Insufficient documentation

## 2012-09-22 DIAGNOSIS — F329 Major depressive disorder, single episode, unspecified: Secondary | ICD-10-CM | POA: Insufficient documentation

## 2012-09-22 DIAGNOSIS — Z8719 Personal history of other diseases of the digestive system: Secondary | ICD-10-CM | POA: Insufficient documentation

## 2012-09-22 DIAGNOSIS — Y929 Unspecified place or not applicable: Secondary | ICD-10-CM | POA: Insufficient documentation

## 2012-09-22 DIAGNOSIS — S8990XA Unspecified injury of unspecified lower leg, initial encounter: Secondary | ICD-10-CM | POA: Insufficient documentation

## 2012-09-22 DIAGNOSIS — S82209A Unspecified fracture of shaft of unspecified tibia, initial encounter for closed fracture: Secondary | ICD-10-CM | POA: Insufficient documentation

## 2012-09-22 DIAGNOSIS — Z79899 Other long term (current) drug therapy: Secondary | ICD-10-CM | POA: Insufficient documentation

## 2012-09-22 DIAGNOSIS — F3289 Other specified depressive episodes: Secondary | ICD-10-CM | POA: Insufficient documentation

## 2012-09-22 DIAGNOSIS — W010XXA Fall on same level from slipping, tripping and stumbling without subsequent striking against object, initial encounter: Secondary | ICD-10-CM | POA: Insufficient documentation

## 2012-09-22 MED ORDER — OXYCODONE-ACETAMINOPHEN 5-325 MG PO TABS: 1.0000 | ORAL_TABLET | ORAL | Status: DC | PRN

## 2012-09-22 MED ORDER — OXYCODONE-ACETAMINOPHEN 5-325 MG PO TABS
2.0000 | ORAL_TABLET | Freq: Once | ORAL | Status: AC
  Administered 2012-09-22: 2 via ORAL
  Filled 2012-09-22: qty 2

## 2012-09-22 NOTE — ED Notes (Signed)
Pt discharged to home with family. NAD.  

## 2012-09-22 NOTE — ED Provider Notes (Signed)
History     CSN: [id number]  Arrival date & time 09/22/12  0909   First MD Initiated Contact with Patient 09/22/12 [phone]      Chief Complaint  Patient presents with  . Fall    (Consider location/radiation/quality/duration/timing/severity/associated sxs/prior treatment) HPI Comments: [name] [name] is a 56 y.o. Female who slipped an fell yesterday morning. She has worsening pain in right knee and foot. No other injury. Pain is worse today. More pain when walking, improves with rest. No other modifying factors.  Patient is a 56 y.o. female presenting with fall. The history is provided by the patient and the spouse.  Fall    Past Medical History  Diagnosis Date  . COPD (chronic obstructive pulmonary disease)     last exacerbation 1 year ago, treated as outpatient  . Shortness of breath   . Recurrent upper respiratory infection (URI)   . Pneumonia   . GERD (gastroesophageal reflux disease)   . Insomnia   . Abdominal spasms     mid  . Depression     Past Surgical History  Procedure Date  . Abdominal hysterectomy   . Appendectomy     Family History  Problem Relation Age of Onset  . Breast cancer Mother   . Breast cancer Maternal Grandmother   . Breast cancer Maternal Aunt     History  Substance Use Topics  . Smoking status: Former Smoker    Types: Cigarettes    Quit date: 02/05/2012  . Smokeless tobacco: Never Used     Comment: states she has already quit smoking  . Alcohol Use: No    OB History    Grav Para Term Preterm Abortions TAB SAB Ect Mult Living                  Review of Systems  All other systems reviewed and are negative.    Allergies  Ibuprofen  Home Medications   Current Outpatient Rx  Name  Route  Sig  Dispense  Refill  . ACETAMINOPHEN 500 MG PO TABS   Oral   Take 1,000 mg by mouth every 6 (six) hours as needed. For pain         . ALBUTEROL SULFATE (5 MG/ML) 0.5% IN NEBU   Nebulization   Take 2.5 mg by nebulization every  6 (six) hours as needed. Wheezing or shortness of breath         . ALPRAZOLAM 0.5 MG PO TABS   Oral   Take 0.5 mg by mouth daily as needed. For anxiety         . CITALOPRAM HYDROBROMIDE 20 MG PO TABS   Oral   Take 20 mg by mouth daily.         . IPRATROPIUM BROMIDE 0.02 % IN SOLN   Nebulization   Take 0.5 mg by nebulization every 6 (six) hours as needed. Shortness of breath         . OXYCODONE-ACETAMINOPHEN 5-325 MG PO TABS   Oral   Take 1 tablet by mouth every 4 (four) hours as needed for pain.   20 tablet   0     BP 128/77  Pulse 88  Temp 97.4 F (36.3 C) (Oral)  Resp 16  Ht 5\' 8"  (1.727 m)  Wt 303 lb (137.44 kg)  BMI 46.07 kg/m2  SpO2 95%  Physical Exam  Nursing note and vitals reviewed. Constitutional: She is oriented to person, place, and time. She appears well-developed and well-nourished.  HENT:  Head: Normocephalic and atraumatic.  Eyes: Conjunctivae normal and EOM are normal. Pupils are equal, round, and reactive to light.  Neck: Normal range of motion and phonation normal. Neck supple.  Cardiovascular: Normal rate, regular rhythm and intact distal pulses.   Pulmonary/Chest: Effort normal and breath sounds normal. She exhibits no tenderness.  Abdominal: Soft. She exhibits no distension. There is no tenderness. There is no guarding.  Musculoskeletal:       Right knee- mild swelling, tender medially; resists flexion d/t pain. Right foot, tender, swollen lateral midfoot, with eccymosis.  Neurological: She is alert and oriented to person, place, and time. She has normal strength. She exhibits normal muscle tone.  Skin: Skin is warm and dry.  Psychiatric: She has a normal mood and affect. Her behavior is normal. Judgment and thought content normal.    ED Course  Procedures (including critical care time)   ED treatment- Analgesia, knee Immobilizer and ASO splint.     Labs Reviewed - No data to display Dg Knee Complete 4 Views Right  09/22/2012   *RADIOLOGY REPORT*  Clinical Data: Fall.  The patient describes the antral lateral pain.  No prior injuries.  RIGHT KNEE - COMPLETE 4+ VIEW  Comparison: None.  Findings: There is a subtle nondisplaced fracture of the medial tibial plateau.  No other convincing fractures.  The joint is normally spaced and aligned.  There is a moderate joint effusion.  The bones are demineralized.  The surrounding soft tissues are unremarkable.  IMPRESSION: Subtle nondisplaced fracture of the medial tibial plateau. Moderate joint effusion.   Original Report Authenticated By: Amie Portland, M.D.    Dg Foot Complete Right  09/22/2012  *RADIOLOGY REPORT*  Clinical Data: Fall.  Bruising and pain to the right foot.  RIGHT FOOT COMPLETE - 3+ VIEW  Comparison: None.  Findings: No fracture.  The joints normally spaced and aligned. Small plantar calcaneal spur.  The soft tissues are unremarkable.  IMPRESSION: No fracture or dislocation.   Original Report Authenticated By: Amie Portland, M.D.      1. Tibial plateau fracture   2. Foot sprain       MDM  Mechanical fall with fracture and sprain. No urgent reduction required. Pt is stable for d/c.   Plan: Home Medications- Percocet; Home Treatments-Rest, wear splints; Recommended follow up- Ortho in 3-4 days        Flint Melter, MD 09/22/12 2042

## 2012-09-22 NOTE — Progress Notes (Signed)
Orthopedic Tech Progress Note Patient Details:  [name] [name] 11-15-1955 [id number]  Ortho Devices Type of Ortho Device: ASO;Knee Immobilizer Ortho Device/Splint Location: RIGHT KNEE IMMOBILIZER , ASO RIGHT ANKLE SUPPORT Ortho Device/Splint Interventions: Application   Cammer, Mickie Bail 09/22/2012, 12:12 PM

## 2012-09-22 NOTE — ED Notes (Signed)
Pt presents to ED with complaints of right foot and right knee pain after falling yesterday. Bruising noted to right foot. NAD.

## 2012-09-24 ENCOUNTER — Other Ambulatory Visit (HOSPITAL_COMMUNITY): Payer: Self-pay | Admitting: Physician Assistant

## 2012-09-24 ENCOUNTER — Telehealth (HOSPITAL_COMMUNITY): Payer: Self-pay | Admitting: *Deleted

## 2012-09-24 NOTE — Telephone Encounter (Signed)
Pt was on Celexa originally given by Dr.Pribula. Patient saw Jorje Guild, PA for initial appt 10/15 and was prescribed Zoloft by him Contacted office 11/11 to report that when she went to pick up Zoloft she remembered taking it before and it did not help. Pt contacted by Jorje Guild PA 11/12 to discuss medication. After discussion with pt, Jorje Guild PA prescribed Celexa 20 mg daily on 11/12. Received refill request for Zoloft for 90 day supply

## 2012-10-01 NOTE — Assessment & Plan Note (Signed)
She is improving and today on exam has no wheezing.  She has not been using the oxygen.  She has also not been able to pick up her new prescription for Advair.  I encouraged her to pick up the medication and to start using it before her prednisone taper is over.  I congratulated her on quitting smoking which will also significantly help her chronic lung condition.

## 2012-10-01 NOTE — Assessment & Plan Note (Addendum)
She continues to have problems with her anxiety.  We will switch her from the Wellbutrin to Celexa.  She will continue the Trazodone for sleep as well. We will also give her a small refill of Xanax to help.  WE discussed that this is a bridge until the Celexa starts to work and that the goal is to NOT have to use the medications.  We will put a referral through to Social work to help her get set up with outpatient mental health treatment.

## 2012-10-01 NOTE — Assessment & Plan Note (Signed)
I congratulated her today on quitting smoking.  I encouraged her to make a plan for when she has a high pressure situations or something similar to what triggered to her to smoke in the past so she is prepared.  We also discussed that it will be an on going process and that she shouldn't lose faith if she does have a relapse.

## 2012-10-23 ENCOUNTER — Ambulatory Visit (INDEPENDENT_AMBULATORY_CARE_PROVIDER_SITE_OTHER): Payer: Medicare Other | Admitting: Physician Assistant

## 2012-10-23 ENCOUNTER — Other Ambulatory Visit (HOSPITAL_COMMUNITY): Payer: Self-pay | Admitting: *Deleted

## 2012-10-23 DIAGNOSIS — F411 Generalized anxiety disorder: Secondary | ICD-10-CM

## 2012-10-23 DIAGNOSIS — F331 Major depressive disorder, recurrent, moderate: Secondary | ICD-10-CM

## 2012-10-23 MED ORDER — TRAZODONE HCL 150 MG PO TABS: 150.0000 mg | ORAL_TABLET | Freq: Every day | ORAL | Status: DC

## 2012-10-23 NOTE — Telephone Encounter (Signed)
Received refill request from pharmacy for 90 day supply of Trazodone. Authorized by Jorje Guild, PA, provider

## 2012-11-14 ENCOUNTER — Other Ambulatory Visit: Payer: Self-pay | Admitting: Internal Medicine

## 2012-11-22 ENCOUNTER — Other Ambulatory Visit (HOSPITAL_COMMUNITY): Payer: Self-pay | Admitting: Physician Assistant

## 2012-11-22 ENCOUNTER — Telehealth (HOSPITAL_COMMUNITY): Payer: Self-pay | Admitting: *Deleted

## 2012-11-22 MED ORDER — CITALOPRAM HYDROBROMIDE 40 MG PO TABS: 40.0000 mg | ORAL_TABLET | Freq: Every day | ORAL | Status: DC

## 2012-11-22 NOTE — Telephone Encounter (Signed)
Talked with pt reason why Dr Dierdre Searles denied med.

## 2012-11-22 NOTE — Telephone Encounter (Signed)
Patient not seeing results with Celexa 20 mg. Increased dose to 40 mg at bedtime. Patient has followup appointment on February 17.

## 2012-11-22 NOTE — Telephone Encounter (Signed)
VM: Patient states she was started on Citalopram at 20 mg. States provider told her if she thought the medicine needed to be increased she should call. She thinks the dosage of Citalopram should be increased.

## 2012-11-27 ENCOUNTER — Encounter (HOSPITAL_COMMUNITY): Payer: Self-pay | Admitting: Emergency Medicine

## 2012-11-27 ENCOUNTER — Emergency Department (HOSPITAL_COMMUNITY): Payer: Medicare Other

## 2012-11-27 ENCOUNTER — Telehealth: Payer: Self-pay | Admitting: *Deleted

## 2012-11-27 ENCOUNTER — Inpatient Hospital Stay (HOSPITAL_COMMUNITY)
Admission: EM | Admit: 2012-11-27 | Discharge: 2012-11-29 | DRG: 194 | Disposition: A | Discharge status: Home / Self Care | Payer: Medicare Other | Attending: Internal Medicine | Admitting: Internal Medicine

## 2012-11-27 DIAGNOSIS — F32A Depression, unspecified: Secondary | ICD-10-CM | POA: Diagnosis present

## 2012-11-27 DIAGNOSIS — G4700 Insomnia, unspecified: Secondary | ICD-10-CM | POA: Diagnosis present

## 2012-11-27 DIAGNOSIS — F411 Generalized anxiety disorder: Secondary | ICD-10-CM | POA: Diagnosis present

## 2012-11-27 DIAGNOSIS — E669 Obesity, unspecified: Secondary | ICD-10-CM | POA: Diagnosis present

## 2012-11-27 DIAGNOSIS — K219 Gastro-esophageal reflux disease without esophagitis: Secondary | ICD-10-CM | POA: Diagnosis present

## 2012-11-27 DIAGNOSIS — Z6841 Body Mass Index (BMI) 40.0 and over, adult: Secondary | ICD-10-CM

## 2012-11-27 DIAGNOSIS — J101 Influenza due to other identified influenza virus with other respiratory manifestations: Principal | ICD-10-CM | POA: Diagnosis present

## 2012-11-27 DIAGNOSIS — Z8701 Personal history of pneumonia (recurrent): Secondary | ICD-10-CM

## 2012-11-27 DIAGNOSIS — Z79899 Other long term (current) drug therapy: Secondary | ICD-10-CM

## 2012-11-27 DIAGNOSIS — J09X2 Influenza due to identified novel influenza A virus with other respiratory manifestations: Secondary | ICD-10-CM | POA: Diagnosis present

## 2012-11-27 DIAGNOSIS — F329 Major depressive disorder, single episode, unspecified: Secondary | ICD-10-CM | POA: Diagnosis present

## 2012-11-27 DIAGNOSIS — Z803 Family history of malignant neoplasm of breast: Secondary | ICD-10-CM

## 2012-11-27 DIAGNOSIS — F419 Anxiety disorder, unspecified: Secondary | ICD-10-CM | POA: Diagnosis present

## 2012-11-27 DIAGNOSIS — J441 Chronic obstructive pulmonary disease with (acute) exacerbation: Secondary | ICD-10-CM | POA: Diagnosis present

## 2012-11-27 DIAGNOSIS — J449 Chronic obstructive pulmonary disease, unspecified: Secondary | ICD-10-CM

## 2012-11-27 DIAGNOSIS — F3289 Other specified depressive episodes: Secondary | ICD-10-CM | POA: Diagnosis present

## 2012-11-27 DIAGNOSIS — Z87891 Personal history of nicotine dependence: Secondary | ICD-10-CM

## 2012-11-27 LAB — CBC WITH DIFFERENTIAL/PLATELET
Eosinophils Absolute: 0.1 10*3/uL (ref 0.0–0.7)
Eosinophils Relative: 1 % (ref 0–5)
MCH: 29 pg (ref 26.0–34.0)
MCHC: 34.5 g/dL (ref 30.0–36.0)
MCV: 84.2 fL (ref 78.0–100.0)
Metamyelocytes Relative: 0 %
Myelocytes: 0 %
Neutrophils Relative %: 75 % (ref 43–77)
Platelets: 184 10*3/uL (ref 150–400)
Promyelocytes Absolute: 0 %
RBC: 5.44 MIL/uL — ABNORMAL HIGH (ref 3.87–5.11)
RDW: 14.6 % (ref 11.5–15.5)
nRBC: 0 /100 WBC

## 2012-11-27 LAB — CBC
HCT: 43.2 % (ref 36.0–46.0)
MCH: 28.9 pg (ref 26.0–34.0)
MCHC: 34.5 g/dL (ref 30.0–36.0)
MCV: 83.7 fL (ref 78.0–100.0)
Platelets: 174 10*3/uL (ref 150–400)
RDW: 14.5 % (ref 11.5–15.5)
WBC: 7.5 10*3/uL (ref 4.0–10.5)

## 2012-11-27 LAB — BASIC METABOLIC PANEL
Chloride: 93 mEq/L — ABNORMAL LOW (ref 96–112)
GFR calc Af Amer: >90 mL/min (ref >90)
GFR calc non Af Amer: >90 mL/min (ref >90)
Glucose, Bld: 109 mg/dL — ABNORMAL HIGH (ref 70–99)
Potassium: 4.5 mEq/L (ref 3.5–5.1)
Sodium: 132 mEq/L — ABNORMAL LOW (ref 135–145)

## 2012-11-27 LAB — INFLUENZA PANEL BY PCR (TYPE A & B)
H1N1 flu by pcr: DETECTED — AB
Influenza B By PCR: NEGATIVE

## 2012-11-27 LAB — PRO B NATRIURETIC PEPTIDE: Pro B Natriuretic peptide (BNP): 105.8 pg/mL (ref 0–125)

## 2012-11-27 MED ORDER — CITALOPRAM HYDROBROMIDE 40 MG PO TABS
40.0000 mg | ORAL_TABLET | Freq: Every day | ORAL | Status: DC
  Administered 2012-11-27 – 2012-11-28 (×2): 40 mg via ORAL
  Filled 2012-11-27 (×3): qty 1

## 2012-11-27 MED ORDER — ONDANSETRON HCL 4 MG/2ML IJ SOLN: 4.0000 mg | Freq: Four times a day (QID) | INTRAMUSCULAR | Status: DC | PRN

## 2012-11-27 MED ORDER — TRAZODONE HCL 150 MG PO TABS
150.0000 mg | ORAL_TABLET | Freq: Every day | ORAL | Status: DC
  Administered 2012-11-27 – 2012-11-28 (×2): 150 mg via ORAL
  Filled 2012-11-27 (×3): qty 1

## 2012-11-27 MED ORDER — ALBUTEROL SULFATE (5 MG/ML) 0.5% IN NEBU
5.0000 mg | INHALATION_SOLUTION | Freq: Once | RESPIRATORY_TRACT | Status: AC
  Administered 2012-11-27: 5 mg via RESPIRATORY_TRACT
  Filled 2012-11-27: qty 1

## 2012-11-27 MED ORDER — PREDNISONE 20 MG PO TABS
40.0000 mg | ORAL_TABLET | Freq: Once | ORAL | Status: AC
  Administered 2012-11-27: 40 mg via ORAL
  Filled 2012-11-27: qty 2

## 2012-11-27 MED ORDER — SODIUM CHLORIDE 0.9 % IV SOLN
INTRAVENOUS | Status: DC
  Administered 2012-11-27 – 2012-11-29 (×4): via INTRAVENOUS
  Administered 2012-11-29: 100 mL via INTRAVENOUS

## 2012-11-27 MED ORDER — HEPARIN SODIUM (PORCINE) 5000 UNIT/ML IJ SOLN
5000.0000 [IU] | Freq: Three times a day (TID) | INTRAMUSCULAR | Status: DC
  Administered 2012-11-27 – 2012-11-29 (×6): 5000 [IU] via SUBCUTANEOUS
  Filled 2012-11-27 (×8): qty 1

## 2012-11-27 MED ORDER — IPRATROPIUM BROMIDE 0.02 % IN SOLN
0.5000 mg | Freq: Four times a day (QID) | RESPIRATORY_TRACT | Status: DC
  Administered 2012-11-27 – 2012-11-28 (×3): 0.5 mg via RESPIRATORY_TRACT
  Filled 2012-11-27 (×3): qty 2.5

## 2012-11-27 MED ORDER — ONDANSETRON HCL 4 MG PO TABS: 4.0000 mg | ORAL_TABLET | Freq: Four times a day (QID) | ORAL | Status: DC | PRN

## 2012-11-27 MED ORDER — PANTOPRAZOLE SODIUM 40 MG PO TBEC
40.0000 mg | DELAYED_RELEASE_TABLET | Freq: Every day | ORAL | Status: DC
  Administered 2012-11-27 – 2012-11-29 (×3): 40 mg via ORAL
  Filled 2012-11-27 (×3): qty 1

## 2012-11-27 MED ORDER — IPRATROPIUM BROMIDE 0.02 % IN SOLN
0.5000 mg | Freq: Once | RESPIRATORY_TRACT | Status: AC
  Administered 2012-11-27: 0.5 mg via RESPIRATORY_TRACT
  Filled 2012-11-27: qty 2.5

## 2012-11-27 MED ORDER — LEVOFLOXACIN IN D5W 500 MG/100ML IV SOLN
500.0000 mg | INTRAVENOUS | Status: DC
  Administered 2012-11-27 – 2012-11-29 (×3): 500 mg via INTRAVENOUS
  Filled 2012-11-27 (×3): qty 100

## 2012-11-27 MED ORDER — LEVALBUTEROL HCL 0.63 MG/3ML IN NEBU
0.6300 mg | INHALATION_SOLUTION | Freq: Four times a day (QID) | RESPIRATORY_TRACT | Status: DC
  Administered 2012-11-27 – 2012-11-28 (×3): 0.63 mg via RESPIRATORY_TRACT
  Filled 2012-11-27 (×8): qty 3

## 2012-11-27 MED ORDER — BIOTENE DRY MOUTH MT LIQD
15.0000 mL | Freq: Two times a day (BID) | OROMUCOSAL | Status: DC
  Administered 2012-11-27 – 2012-11-28 (×3): 15 mL via OROMUCOSAL

## 2012-11-27 MED ORDER — ALBUTEROL (5 MG/ML) CONTINUOUS INHALATION SOLN
10.0000 mg/h | INHALATION_SOLUTION | RESPIRATORY_TRACT | Status: DC
  Administered 2012-11-27: 10 mg/h via RESPIRATORY_TRACT
  Filled 2012-11-27 (×3): qty 20

## 2012-11-27 MED ORDER — ACETAMINOPHEN 650 MG RE SUPP: 650.0000 mg | Freq: Four times a day (QID) | RECTAL | Status: DC | PRN

## 2012-11-27 MED ORDER — ALBUTEROL SULFATE (5 MG/ML) 0.5% IN NEBU
10.0000 mg | INHALATION_SOLUTION | Freq: Once | RESPIRATORY_TRACT | Status: AC
  Administered 2012-11-27: 10 mg via RESPIRATORY_TRACT
  Filled 2012-11-27: qty 2

## 2012-11-27 MED ORDER — ACETAMINOPHEN 325 MG PO TABS
650.0000 mg | ORAL_TABLET | Freq: Four times a day (QID) | ORAL | Status: DC | PRN
  Administered 2012-11-27: 650 mg via ORAL
  Filled 2012-11-27: qty 2

## 2012-11-27 MED ORDER — BENZONATATE 100 MG PO CAPS
100.0000 mg | ORAL_CAPSULE | Freq: Three times a day (TID) | ORAL | Status: DC | PRN
  Administered 2012-11-28 – 2012-11-29 (×2): 100 mg via ORAL
  Filled 2012-11-27 (×3): qty 1

## 2012-11-27 MED ORDER — METHYLPREDNISOLONE SODIUM SUCC 125 MG IJ SOLR
125.0000 mg | Freq: Once | INTRAMUSCULAR | Status: AC
  Filled 2012-11-27: qty 2

## 2012-11-27 NOTE — ED Notes (Signed)
Congestion since last Tuesday got sorethroat was seen at [facility]  Given meds and is taking them still feels bad is sob

## 2012-11-27 NOTE — ED Notes (Signed)
Attempts to site IV unsuccessful  x4 total - PA into room with last attempt,  Pt not tolerating well. OK given to dc attempts  and to administer steroids as po

## 2012-11-27 NOTE — ED Provider Notes (Signed)
Medical screening examination/treatment/procedure(s) were conducted as a shared visit with non-physician practitioner(s) and myself.  I personally evaluated the patient during the encounter  Pt still with wheeze/tachypnea despite therapy, request her physcian to evaluate for probable admission   Joya Gaskins, MD 11/27/12 1645

## 2012-11-27 NOTE — Telephone Encounter (Signed)
Pt called for appointment this AM. Seen at [facility] over weekend but has not improved.  Now unable to sit up. Flu sx. When I called they were leaving for hospital.   We have appointments this afternoon but advised ED visit as she feels so bad and has gotten worse since [facility] visit.

## 2012-11-27 NOTE — Progress Notes (Signed)
1845 Patient arrived to room from ED.nontelemerty and placed on Droplet Precaution.

## 2012-11-27 NOTE — ED Provider Notes (Signed)
Medical screening examination/treatment/procedure(s) were conducted as a shared visit with non-physician practitioner(s) and myself.  I personally evaluated the patient during the encounter   Joya Gaskins, MD 11/27/12 [phone]

## 2012-11-27 NOTE — ED Provider Notes (Signed)
MSE was initiated and I personally evaluated the patient and placed orders (if any) at  11:41 AM on November 27, 2012.  The patient appears stable so that the remainder of the MSE may be completed by another provider.  Pt with history of COPD presents emergency department complaining of flulike symptoms including cough, congestion, and sore throat.  Patient treated with Z-Pak.  Patient reports worsening shortness of breath.  No patient is on 2 L nasal cannula at night but does not require oxygen during the day.  BP 118/89  Pulse 94  Temp 99 F (37.2 C)  Resp 24  SpO2 91%   Patient evaluated and is in mild to moderate respiratory distress.  On evaluation respiratory rate of 29 with retractions and decreased airway movement.  Heart with regular rate and rhythm.  Patient placed on monitor with continuous pulse ox. Labs and imaging ordered.   Plan is to order nebulizer and plan disposition pending results.  Patient will be moved to one of the pod for further evaluation.    Jaci Carrel, [place] 11/27/12 1145

## 2012-11-27 NOTE — ED Provider Notes (Signed)
History     CSN: [id number]  Arrival date & time 11/27/12  1056   First MD Initiated Contact with Patient 11/27/12 1137      Chief Complaint  Patient presents with  . Cough    (Consider location/radiation/quality/duration/timing/severity/associated sxs/prior treatment) HPI Comments: This is a 57 year old female, past medical history remarkable for COPD, GERD, and recent sore throat treated with azithromycin, who presents to the emergency department with chief complaint of cough and wheezing. Patient states that she began wheezing on Thursday, and this is progressively worsened until today. She's been taking the azithromycin, which was prescribed to her for her sore throat. Medical screening exam performed by Earlie Lou, who administered a nebulizer treatment. The patient states that the nebulizer helped a little, but did not alleviate her symptoms. Nothing makes her symptoms worse or better.  The history is provided by the patient. No language interpreter was used.    Past Medical History  Diagnosis Date  . COPD (chronic obstructive pulmonary disease)     last exacerbation 1 year ago, treated as outpatient  . Shortness of breath   . Recurrent upper respiratory infection (URI)   . Pneumonia   . GERD (gastroesophageal reflux disease)   . Insomnia   . Abdominal spasms     mid  . Depression     Past Surgical History  Procedure Date  . Abdominal hysterectomy   . Appendectomy     Family History  Problem Relation Age of Onset  . Breast cancer Mother   . Breast cancer Maternal Grandmother   . Breast cancer Maternal Aunt     History  Substance Use Topics  . Smoking status: Former Smoker    Types: Cigarettes    Quit date: 02/05/2012  . Smokeless tobacco: Never Used     Comment: states she has already quit smoking  . Alcohol Use: No    OB History    Grav Para Term Preterm Abortions TAB SAB Ect Mult Living                  Review of Systems  All other systems  reviewed and are negative.    Allergies  Ibuprofen  Home Medications   Current Outpatient Rx  Name  Route  Sig  Dispense  Refill  . ACETAMINOPHEN 500 MG PO TABS   Oral   Take 1,000 mg by mouth every 6 (six) hours as needed. For pain         . ALBUTEROL SULFATE (5 MG/ML) 0.5% IN NEBU   Nebulization   Take 2.5 mg by nebulization every 6 (six) hours as needed. Wheezing or shortness of breath         . CITALOPRAM HYDROBROMIDE 40 MG PO TABS   Oral   Take 1 tablet (40 mg total) by mouth at bedtime.   30 tablet   0   . IPRATROPIUM BROMIDE 0.02 % IN SOLN   Nebulization   Take 0.5 mg by nebulization every 6 (six) hours as needed. Shortness of breath         . TRAZODONE HCL 150 MG PO TABS   Oral   Take 1 tablet (150 mg total) by mouth at bedtime.   90 tablet   0     May have 90 day supply.Please discontinue previous ...     BP 118/89  Pulse 94  Temp 99 F (37.2 C)  Resp 24  SpO2 91%  Physical Exam  Nursing note and vitals reviewed. Constitutional:  She is oriented to person, place, and time. She appears well-developed and well-nourished.  HENT:  Head: Normocephalic and atraumatic.  Eyes: Conjunctivae normal and EOM are normal. Pupils are equal, round, and reactive to light.  Neck: Normal range of motion. Neck supple.  Cardiovascular: Normal rate and regular rhythm.  Exam reveals no gallop and no friction rub.   No murmur heard. Pulmonary/Chest: Effort normal. No respiratory distress. She has wheezes. She has no rales. She exhibits no tenderness.       Increased work of breathing, patient speaks in short sentences, diffuse wheezing heard  Abdominal: Soft. Bowel sounds are normal. She exhibits no distension and no mass. There is no tenderness. There is no rebound and no guarding.  Musculoskeletal: Normal range of motion. She exhibits no edema and no tenderness.  Neurological: She is alert and oriented to person, place, and time.  Skin: Skin is warm and dry.    Psychiatric: She has a normal mood and affect. Her behavior is normal. Judgment and thought content normal.    ED Course  Procedures (including critical care time)  Labs Reviewed  CBC WITH DIFFERENTIAL - Abnormal; Notable for the following:    RBC 5.44 (*)     Hemoglobin 15.8 (*)     All other components within normal limits  BASIC METABOLIC PANEL   Dg Chest 2 View  11/27/2012  *RADIOLOGY REPORT*  Clinical Data: Shortness of breath.  COPD.  Left-sided chest pain.  CHEST - 2 VIEW  Comparison: 07/02/2012  Findings: The patient is rotated to the right on today's exam, resulting in reduced diagnostic sensitivity and specificity. Heart size within normal limits.  Stable mild interstitial accentuation in the lungs.  No pleural effusion.  No airspace opacity.  IMPRESSION:  1.  Stable mild interstitial accentuation in the lungs appears chronic.   Otherwise, no significant abnormality identified.   Original Report Authenticated By: Gaylyn Rong, M.D.    ED ECG REPORT  I personally interpreted this EKG   Date: 11/27/2012   Rate: 109  Rhythm: sinus tachycardia  QRS Axis: left  Intervals: normal  ST/T Wave abnormalities: nonspecific ST/T changes  Conduction Disutrbances:none  Narrative Interpretation:   Old EKG Reviewed: unchanged    1. COPD (chronic obstructive pulmonary disease)       MDM  57 year old female with wheezing and cough. Chest x-ray is unremarkable for acute process. Will give the patient a continuous nebs, and IV Solu-Medrol. Will reevaluate, and keep the patient on O2 monitor.  2:59 PM Patient did not tolerate IV access, therefore, ordered PO prednisone.  I have re-evaluated the patient, and she is still wheezing after an hour long nebulizer treatment.  Her O2 sat is 93% on 3L [place].  Discussed the patient with Dr. Bebe Shaggy, who will see the patient.  3:33 PM Consulted outpatient clinics, who will admit the patient.        Roxy Horseman, PA-C 11/27/12  1557

## 2012-11-27 NOTE — H&P (Signed)
Hospital Admission Note Date: 11/27/2012  Patient name: [name] [name] Medical record number: [id number] Date of birth: January 27, 1956 Age: 57 y.o. Gender: female PCP: Dierdre Searles NA, MD  Medical Service: Internal medicine teaching service  Attending physician: Dr. Margarito Liner   1st Contact: Dr. Zada Girt   Pager: [phone]  2nd Contact: Dr. Bosie Clos   [phone]    After 5 pm or weekends:   1st Contact: Pager: [phone]  2nd Contact: Pager: [phone]  Chief Complaint: Increasing shortness of breath with cough for one week.  History of Present Illness: [name] [name] is a 57 year old woman, with past medical history of COPD, obesity and depression who presents to the ED with complaints of worsening shortness of breath. She reports that at baseline she has some shortness of breath however, over the last 1 week this has significantly increased with sore throat, congestion, and some cough with thin whitish phlem. She also noted that she has started feeling tired and just feels like going to sleep all the time. She was seen in urgent care where she prescribed a Z-Pak 500 mg once daily for 4 days. She has not noted any improvement in her symptoms after using the medication for 3 days. At home, she usually uses oxygen when she is sick at 3 L per min and she has increased this to 4 L per minute since onset of symptoms, but she has not gotten any relief. She is not not on continuous home oxygen. She is very short of breath on exertion with just walking across the room. She denies contact with any sick person. She denies muscle aches. She reports some chest pain, mainly on the right side which is very mild in severity. She has not had any recent travels.  She denies history of leg swelling, PND, or palpitations. She denies history of syncope.  Meds: Current Outpatient Rx  Name  Route  Sig  Dispense  Refill  . ACETAMINOPHEN 500 MG PO TABS   Oral   Take 1,000 mg by mouth every 6 (six) hours as needed. For  pain         . ALBUTEROL SULFATE (5 MG/ML) 0.5% IN NEBU   Nebulization   Take 2.5 mg by nebulization every 6 (six) hours as needed. Wheezing or shortness of breath         . CITALOPRAM HYDROBROMIDE 40 MG PO TABS   Oral   Take 1 tablet (40 mg total) by mouth at bedtime.   30 tablet   0   . IPRATROPIUM BROMIDE 0.02 % IN SOLN   Nebulization   Take 0.5 mg by nebulization every 6 (six) hours as needed. Shortness of breath         . TRAZODONE HCL 150 MG PO TABS   Oral   Take 1 tablet (150 mg total) by mouth at bedtime.   90 tablet   0     May have 90 day supply.Please discontinue previous ...     Allergies: Allergies as of 11/27/2012 - Review Complete 11/27/2012  Allergen Reaction Noted  . Ibuprofen Hives and Swelling 02/01/2012   Past Medical History  Diagnosis Date  . COPD (chronic obstructive pulmonary disease)     last exacerbation 1 year ago, treated as outpatient  . Shortness of breath   . Recurrent upper respiratory infection (URI)   . Pneumonia   . GERD (gastroesophageal reflux disease)   . Insomnia   . Abdominal spasms     mid  . Depression  Past Surgical History  Procedure Date  . Abdominal hysterectomy   . Appendectomy    Family History  Problem Relation Age of Onset  . Breast cancer Mother   . Breast cancer Maternal Grandmother   . Breast cancer Maternal Aunt    History   Social History  . Marital Status: Married    Spouse Name: N/A    Number of Children: N/A  . Years of Education: N/A   Occupational History  . Not on file.   Social History Main Topics  . Smoking status: Former Smoker    Types: Cigarettes    Quit date: 02/05/2012  . Smokeless tobacco: Never Used     Comment: states she has already quit smoking  . Alcohol Use: No  . Drug Use: No  . Sexually Active: Not Currently    Birth Control/ Protection: Post-menopausal   Other Topics Concern  . Not on file   Social History Narrative   lives in [place] with her  husband. Has one son who is 17 years old. Used to work in [employer], on disability now. Has Medicare. Smokes 2 packs per day for past 40 years. Has not had a cigarette last 3 weeks since she got sick. Never drank alcohol. Never did  drugs.    Review of Systems: Constitutional: positive for anorexia, chills, fatigue, fevers, night sweats and sweats Eyes: negative Ears, nose, mouth, throat, and face: negative for ear drainage, earaches, facial trauma, voice change and no post nasal drip. Respiratory: negative, as noted above in HPI Cardiovascular: negative Gastrointestinal: positive for vomiting, negative for reduced oral in take since onset of illiness. Genitourinary:negative Hematologic/lymphatic: negative Musculoskeletal:negative for arthralgias, back pain, bone pain, muscle weakness, myalgias, neck pain and stiff joints Neurological: negative Endocrine: negative Allergic/Immunologic: negative  Physical Exam: Blood pressure 118/89, pulse 94, temperature 99 F (37.2 C), resp. rate 24, SpO2 92.00%. General appearance: alert, cooperative, fatigued, moderate distress and breathing heavily with wheezes Head: Normocephalic, without obvious abnormality, atraumatic Nose: Nares normal. Septum midline. Mucosa normal. No drainage or sinus tenderness. Throat: lips, mucosa, and tongue normal; teeth and gums normal Neck: no adenopathy, no carotid bruit, no JVD, supple, symmetrical, trachea midline and thyroid not enlarged, symmetric, no tenderness/mass/nodules Lungs: wheezes bibasilar and bilaterally Heart: regular rate and rhythm, S1, S2 normal, no murmur, click, rub or gallop Abdomen: soft, non-tender; bowel sounds normal; no masses,  no organomegaly Pelvic: deferred Extremities: extremities normal, atraumatic, no cyanosis or edema Pulses: 2+ and symmetric Skin: Skin color, texture, turgor normal. No rashes or lesions Neurologic: Alert and oriented X 3, normal strength and tone. Normal  symmetric reflexes. Normal coordination and gait  Lab results: Basic Metabolic Panel:  Basename 11/27/12 1144  NA 132*  K 4.5  CL 93*  CO2 25  GLUCOSE 109*  BUN 8  CREATININE 0.54  CALCIUM 9.2  MG --  PHOS --   Liver Function Tests: No results found for this basename: AST:2,ALT:2,ALKPHOS:2,BILITOT:2,PROT:2,ALBUMIN:2 in the last 72 hours No results found for this basename: LIPASE:2,AMYLASE:2 in the last 72 hours No results found for this basename: AMMONIA:2 in the last 72 hours CBC:  Basename 11/27/12 1144  WBC 6.7  NEUTROABS 5.0  HGB 15.8*  HCT 45.8  MCV 84.2  PLT 184    Imaging results:  Dg Chest 2 View  11/27/2012  *RADIOLOGY REPORT*  Clinical Data: Shortness of breath.  COPD.  Left-sided chest pain.  CHEST - 2 VIEW  Comparison: 07/02/2012  Findings: The patient is rotated to  the right on today's exam, resulting in reduced diagnostic sensitivity and specificity. Heart size within normal limits.  Stable mild interstitial accentuation in the lungs.  No pleural effusion.  No airspace opacity.  IMPRESSION:  1.  Stable mild interstitial accentuation in the lungs appears chronic.   Otherwise, no significant abnormality identified.   Original Report Authenticated By: Gaylyn Rong, M.D.     Other results: EKG: Normal sinus rhythm, with a ventricular rate of 109, a left axis deviation with left anterior fascicular block, incomplete right bundle branch block, prolonged QTc of 496, otherwise, no signs of ischemia, like, T-wave inversions or ST segment elevations. ST segment depressions in leads, V4 and V5, but is unchanged compared to previous one of August, 2013.  Assessment & Plan by Problem: Ms. Arceneaux is a 57 year old woman, with past medical history of severe COPD, obesity, and depression, who presents with worsening of her symptoms for one week.  1. COPD exacerbation: Her presentation with cough, worsening shortness of breath, and some sputum production, is consistent  with a COPD exacerbation. I suspect that the trigger for this exacerbation is an upper respiratory tract infection, most likely viral. She did not respond to her recent Z-Pak which was prescribed as outpatient. She reported history of fevers and chills but she was afebrile on PE. She has tachycardia with oxygen saturations of 91-92% on 2 L by nasal cannula of oxygen. Examination reveals widespread expiratory wheezes. CBC white blood cell count of 6.7, which is not elevated. This points away  from this being a possible bacteria infection. Initial chest x-ray in the ED, he did not show any acute pulmonary process. The possibility of this being from viral infection is high given the timing of her presentation. Other etiologies like such as myocardial ischemia, heart failure, aspiration, or pulmonary embolism where not suspected from the history, physical findings and tests. EKG was unchanged but reveal prolonged QTc.   Plan. -Admit her to telemetry. -Solu-Medrol, -Ventricular tachycardia, she'll be started on Xopenex in the place of albuterol. She was also started on Ipratropium. -As outpatient, she'll need a steroid in combination with a long-acting bronchodilator in the inhaler for home such as Dulera or Advair -Tessalon for symptomatic relief - Will give oxygen therapy with nasal cannula to keep her oxygen saturations between 93 and 95%. - Levaquin  2. Depression: Patient was recently evaluated by behavioral health and her medications were changed from trazodone 150 mg once daily to Celexa 40 mg at bedtime. However, the patient continued taking trazodone. Currently, she denies suicidal ideations. Plan -Will continue with Celexa. -Will encourage the patient to stop using trazodone.  Dispo: Disposition is deferred at this time, awaiting improvement of current medical problems. Anticipated discharge in approximately 2-3 day(s).   The patient does have a current PCP ([facility], NA, MD), therefore will be  requiring OPC follow-up after discharge.   The patient does not have transportation limitations that hinder transportation to clinic appointments.  Signed: Dow Adolph 11/27/2012, 4:48 PM

## 2012-11-27 NOTE — ED Notes (Signed)
States is normall on o2 but did not bring it with her today

## 2012-11-28 DIAGNOSIS — J449 Chronic obstructive pulmonary disease, unspecified: Secondary | ICD-10-CM

## 2012-11-28 DIAGNOSIS — J101 Influenza due to other identified influenza virus with other respiratory manifestations: Principal | ICD-10-CM

## 2012-11-28 DIAGNOSIS — J09X2 Influenza due to identified novel influenza A virus with other respiratory manifestations: Secondary | ICD-10-CM | POA: Diagnosis present

## 2012-11-28 LAB — BASIC METABOLIC PANEL
BUN: 10 mg/dL (ref 6–23)
CO2: 26 mEq/L (ref 19–32)
Calcium: 9.2 mg/dL (ref 8.4–10.5)
Chloride: 100 mEq/L (ref 96–112)
Creatinine, Ser: 0.53 mg/dL (ref 0.50–1.10)
Glucose, Bld: 103 mg/dL — ABNORMAL HIGH (ref 70–99)

## 2012-11-28 LAB — CBC
HCT: 41.9 % (ref 36.0–46.0)
MCH: 28.9 pg (ref 26.0–34.0)
MCHC: 34.6 g/dL (ref 30.0–36.0)
MCV: 83.5 fL (ref 78.0–100.0)
Platelets: 165 10*3/uL (ref 150–400)
RDW: 14.5 % (ref 11.5–15.5)
WBC: 5.4 10*3/uL (ref 4.0–10.5)

## 2012-11-28 MED ORDER — WHITE PETROLATUM GEL
Status: AC
  Administered 2012-11-28: 03:00:00
  Filled 2012-11-28: qty 5

## 2012-11-28 MED ORDER — PREDNISONE 20 MG PO TABS
40.0000 mg | ORAL_TABLET | Freq: Every day | ORAL | Status: DC
  Administered 2012-11-29: 40 mg via ORAL
  Filled 2012-11-28 (×2): qty 2

## 2012-11-28 MED ORDER — MOMETASONE FURO-FORMOTEROL FUM 100-5 MCG/ACT IN AERO
2.0000 | INHALATION_SPRAY | Freq: Two times a day (BID) | RESPIRATORY_TRACT | Status: DC
  Administered 2012-11-28 – 2012-11-29 (×3): 2 via RESPIRATORY_TRACT
  Filled 2012-11-28: qty 8.8

## 2012-11-28 MED ORDER — LEVALBUTEROL HCL 0.63 MG/3ML IN NEBU
0.6300 mg | INHALATION_SOLUTION | Freq: Four times a day (QID) | RESPIRATORY_TRACT | Status: DC | PRN
  Filled 2012-11-28: qty 3

## 2012-11-28 MED ORDER — OSELTAMIVIR PHOSPHATE 75 MG PO CAPS
75.0000 mg | ORAL_CAPSULE | Freq: Two times a day (BID) | ORAL | Status: DC
  Administered 2012-11-28 – 2012-11-29 (×3): 75 mg via ORAL
  Filled 2012-11-28 (×4): qty 1

## 2012-11-28 MED ORDER — IPRATROPIUM BROMIDE 0.02 % IN SOLN
0.5000 mg | Freq: Four times a day (QID) | RESPIRATORY_TRACT | Status: DC | PRN
  Filled 2012-11-28: qty 2.5

## 2012-11-28 NOTE — Progress Notes (Signed)
Subjective: She is breathing better today. She feels more comfortable this morning. Objective: Vital signs in last 24 hours: Filed Vitals:   11/28/12 0208 11/28/12 0536 11/28/12 0613 11/28/12 0941  BP:  101/47    Pulse:  74    Temp:  98.2 F (36.8 C)    TempSrc:  Oral    Resp:  22    Height:      Weight:      SpO2: 90% 96% 95% 92%   Weight change:   Intake/Output Summary (Last 24 hours) at 11/28/12 1130 Last data filed at 11/28/12 1000  Gross per 24 hour  Intake 1373.33 ml  Output      0 ml  Net 1373.33 ml   General appearance: alert, cooperative and mild distress Lungs: wheezes bilaterally Heart: regular rate and rhythm, S1, S2 normal, no murmur, click, rub or gallop Abdomen: soft, non-tender; bowel sounds normal; no masses,  no organomegaly Extremities: extremities normal, atraumatic, no cyanosis or edema Pulses: 2+ and symmetric Neurologic: Alert and oriented X 3, normal strength and tone. Normal symmetric reflexes. Normal coordination and gait Lab Results: Basic Metabolic Panel:  Lab 11/28/12 1610 11/27/12 2115 11/27/12 1144  NA 139 -- 132*  K 3.3* -- 4.5  CL 100 -- 93*  CO2 26 -- 25  GLUCOSE 103* -- 109*  BUN 10 -- 8  CREATININE 0.53 0.72 --  CALCIUM 9.2 -- 9.2  MG -- -- --  PHOS -- -- --    CBC:  Lab 11/28/12 0635 11/27/12 2115 11/27/12 1144  WBC 5.4 7.5 --  NEUTROABS -- -- 5.0  HGB 14.5 14.9 --  HCT 41.9 43.2 --  MCV 83.5 83.7 --  PLT 165 174 --   BNP:  Lab 11/27/12 1144  PROBNP 105.8   Misc. Labs:  Studies/Results: Dg Chest 2 View  11/27/2012  *RADIOLOGY REPORT*  Clinical Data: Shortness of breath.  COPD.  Left-sided chest pain.  CHEST - 2 VIEW  Comparison: 07/02/2012  Findings: The patient is rotated to the right on today's exam, resulting in reduced diagnostic sensitivity and specificity. Heart size within normal limits.  Stable mild interstitial accentuation in the lungs.  No pleural effusion.  No airspace opacity.  IMPRESSION:  1.  Stable  mild interstitial accentuation in the lungs appears chronic.   Otherwise, no significant abnormality identified.   Original Report Authenticated By: Gaylyn Rong, M.D.    Medications: I have reviewed the patient's current medications. Scheduled Meds:   . antiseptic oral rinse  15 mL Mouth Rinse BID  . citalopram  40 mg Oral QHS  . heparin  5,000 Units Subcutaneous Q8H  . levofloxacin  500 mg Intravenous Q24H  . mometasone-formoterol  2 puff Inhalation BID  . oseltamivir  75 mg Oral BID  . pantoprazole  40 mg Oral Daily  . traZODone  150 mg Oral QHS   Continuous Infusions:   . sodium chloride 100 mL/hr at 11/28/12 0509   PRN Meds:.acetaminophen, acetaminophen, benzonatate, ipratropium, levalbuterol, ondansetron (ZOFRAN) IV, ondansetron Assessment/Plan: Ms. Rodier is a 57 year old woman, with past medical history of severe COPD, obesity, and depression, who presents with worsening of her symptoms for one week.   COPD exacerbation: Her presentation with cough, worsening shortness of breath, and some sputum production, was consistent with a COPD exacerbation. We suspected that the trigger for this exacerbation was an upper respiratory tract infection, most likely viral. She did not respond to her recent Z-Pak which was prescribed as outpatient. She reported history  of fevers and chills but she was afebrile on presentations. She has tachycardia with oxygen saturations of 91-92% on 2 L by nasal cannula of oxygen. Examination revealed widespread expiratory wheezes. CBC white blood cell count of 6.7 which pointed away from this being a possible bacteria infection. Initial chest x-ray in the ED, he did not show any acute pulmonary process. The possibility of this being from viral infection was high given the timing of her presentation in the winter. Other etiologies like such as myocardial ischemia, heart failure, aspiration, or pulmonary embolism where not suspected from the history, physical  findings and tests. EKG was unchanged but reveal prolonged QTc. Her Influenze PCR came back positive for H1N1. She is improving clinically with less wheezing. Plan.  - change Solu-Medrol to oral prednisolone 40 mg once daily for 4 more days  - Started on Tamiflu for 5 days  - started her on Durela -Continue with  Xopenex and Ipratropium as need for SOB -Tessalon for symptomatic relief  - Will give oxygen therapy with nasal cannula to keep her oxygen saturations between 93 and 95%.  - continue with Levaquin 500 mg once daily for 7 days   2. Depression: Patient was recently evaluated by behavioral health and her medications were changed from trazodone 150 mg once daily to Celexa 40 mg at bedtime. However, the patient continued taking trazodone. Currently, she denies suicidal ideations.  Plan  -Will continue with Celexa.  -Will encourage the patient to stop using trazodone.   Dispo: Disposition is deferred at this time, awaiting improvement of current medical problems. Anticipated discharge in approximately 2-3 day(s).  The patient does have a current PCP ([facility], NA, MD), therefore will be requiring OPC follow-up after discharge.  The patient does not have transportation limitations that hinder transportation to clinic appointments.    LOS: 1 day   Dow Adolph 11/28/2012, 11:30 AM

## 2012-11-28 NOTE — H&P (Signed)
Internal Medicine Attending Admission Note Date: 11/28/2012  Patient name: [name] [name] Medical record number: [id number] Date of birth: 1956/01/23 Age: 57 y.o. Gender: female  I saw and evaluated the patient, and discussed her care with house staff. I reviewed the resident's note and I agree with the resident's findings and plan as documented in the resident's note, with the following additional comments.  Chief Complaint(s): Shortness of breath, cough  History - key components related to admission: Patient is a 57 year old woman with history of COPD on nocturnal home oxygen,GERD,depression, and other problems as outlined in medical history,admitted with a one-week history of worsening shortness of breath and cough.  Her symptoms began about one week ago with a sore throat and nasal congestion.  She saw a physician at urgent care and was prescribed a Z-Pak.  Her cough and shortness of breath progressively worsened prompting her to come in to the emergency department.   Physical Exam - key components related to admission:  Filed Vitals:   11/28/12 0536 11/28/12 0613 11/28/12 0941 11/28/12 1344  BP: 101/47   109/53  Pulse: 74   85  Temp: 98.2 F (36.8 C)   98 F (36.7 C)  TempSrc: Oral   Oral  Resp: 22   20  Height:      Weight:      SpO2: 96% 95% 92% 95%    General: Alert, oriented Lungs: Moderate diffuse bilateral wheezing Heart: Regular; no extra sounds or murmurs Abdomen: Bowel sounds present, soft, nontender Extremities: No edema   Lab results:   Basic Metabolic Panel:  Basename 11/28/12 0635 11/27/12 2115 11/27/12 1144  NA 139 -- 132*  K 3.3* -- 4.5  CL 100 -- 93*  CO2 26 -- 25  GLUCOSE 103* -- 109*  BUN 10 -- 8  CREATININE 0.53 0.72 --  CALCIUM 9.2 -- 9.2  MG -- -- --  PHOS -- -- --    CBC:  Basename 11/28/12 0635 11/27/12 2115 11/27/12 1144  WBC 5.4 7.5 --  NEUTROABS -- -- 5.0  HGB 14.5 14.9 --  HCT 41.9 43.2 --  MCV 83.5 83.7 --  PLT 165 174  --     Imaging results:  Dg Chest 2 View  11/27/2012  *RADIOLOGY REPORT*  Clinical Data: Shortness of breath.  COPD.  Left-sided chest pain.  CHEST - 2 VIEW  Comparison: 07/02/2012  Findings: The patient is rotated to the right on today's exam, resulting in reduced diagnostic sensitivity and specificity. Heart size within normal limits.  Stable mild interstitial accentuation in the lungs.  No pleural effusion.  No airspace opacity.  IMPRESSION:  1.  Stable mild interstitial accentuation in the lungs appears chronic.   Otherwise, no significant abnormality identified.   Original Report Authenticated By: Gaylyn Rong, M.D.     Influenza A by PCR: Positive Influenza B by PCR: Negative H1N1 flu by PCR: Detected   Other results: EKG: sinus tachycardia; left axis deviation; borderline repolarization abnormality; borderline QTc prolongation   Assessment & Plan by Problem:  1.  COPD exacerbation, precipitated by influenza.  Plan is IV steroids; inhaled bronchodilators; empiric levofloxacin; supplement oxygen and follow saturations; treat influenza.  Long-term, she would likely benefit from the addition of a combination inhaled long-acting beta agonist/steroid.  2.  Influenza.  The plan is to treat with Tamiflu (oseltamivir); supportive care.  3.  Other problems as per resident physician's note.

## 2012-11-29 LAB — CBC
Hemoglobin: 14.1 g/dL (ref 12.0–15.0)
MCH: 28.2 pg (ref 26.0–34.0)
MCHC: 32.8 g/dL (ref 30.0–36.0)
RDW: 14.8 % (ref 11.5–15.5)

## 2012-11-29 LAB — BASIC METABOLIC PANEL
BUN: 6 mg/dL (ref 6–23)
Calcium: 8.7 mg/dL (ref 8.4–10.5)
GFR calc Af Amer: >90 mL/min (ref >90)
GFR calc non Af Amer: >90 mL/min (ref >90)
Glucose, Bld: 94 mg/dL (ref 70–99)
Potassium: 3.7 mEq/L (ref 3.5–5.1)
Sodium: 142 mEq/L (ref 135–145)

## 2012-11-29 MED ORDER — MOMETASONE FURO-FORMOTEROL FUM 100-5 MCG/ACT IN AERO: 2.0000 | INHALATION_SPRAY | Freq: Two times a day (BID) | RESPIRATORY_TRACT | Status: DC

## 2012-11-29 MED ORDER — BENZONATATE 100 MG PO CAPS: 100.0000 mg | ORAL_CAPSULE | Freq: Three times a day (TID) | ORAL | Status: DC | PRN

## 2012-11-29 MED ORDER — OSELTAMIVIR PHOSPHATE 75 MG PO CAPS: 75.0000 mg | ORAL_CAPSULE | Freq: Two times a day (BID) | ORAL | Status: DC

## 2012-11-29 MED ORDER — LEVOFLOXACIN 500 MG PO TABS: 500.0000 mg | ORAL_TABLET | Freq: Every day | ORAL | Status: DC

## 2012-11-29 MED ORDER — METHYLPREDNISOLONE SODIUM SUCC 125 MG IJ SOLR
60.0000 mg | Freq: Once | INTRAMUSCULAR | Status: AC
  Administered 2012-11-29: 60 mg via INTRAVENOUS
  Filled 2012-11-29: qty 2
  Filled 2012-11-29: qty 0.96

## 2012-11-29 MED ORDER — PREDNISONE 20 MG PO TABS: 40.0000 mg | ORAL_TABLET | Freq: Every day | ORAL | Status: DC

## 2012-11-29 MED ORDER — POTASSIUM CHLORIDE 10 MEQ/100ML IV SOLN
10.0000 meq | INTRAVENOUS | Status: AC
  Administered 2012-11-29 (×4): 10 meq via INTRAVENOUS
  Filled 2012-11-29 (×4): qty 100

## 2012-11-29 NOTE — Care Management Note (Signed)
    Page 1 of 1   12/04/2012     2:00:52 PM   CARE MANAGEMENT NOTE 12/04/2012  Patient:  [name] [name], [name] [name]   Account Number:  [id number]  Date Initiated:  11/29/2012  Documentation initiated by:  Letha Cape  Subjective/Objective Assessment:   dx influenza a h1n1  admit- lives with spouse, pta independent.     Action/Plan:   pt eval - no pt needs.   Anticipated DC Date:  12/01/2012   Anticipated DC Plan:  HOME/SELF CARE      DC Planning Services  CM consult      Choice offered to / List presented to:             Status of service:  Completed, signed off Medicare Important Message given?   (If response is "NO", the following Medicare IM given date fields will be blank) Date Medicare IM given:   Date Additional Medicare IM given:    Discharge Disposition:  HOME/SELF CARE  Per UR Regulation:  Reviewed for med. necessity/level of care/duration of stay  If discussed at Long Length of Stay Meetings, dates discussed:    Comments:  11/29/12 17:39 Letha Cape RN, BSN [phone] patient lives with spouse, pta independent.  per physical therapy pt has no pt needs but looks as though she will need home oxygen by pt 's note.  NCM will continue to follow for dc needs.

## 2012-11-29 NOTE — Evaluation (Signed)
Physical Therapy Evaluation Patient Details Name: [name] [name] MRN: [id number] DOB: 21-Jan-1956 Today's Date: 11/29/2012 Time: 1308-6578 PT Time Calculation (min): 26 min  PT Assessment / Plan / Recommendation Clinical Impression  Patient admitted with flu with PMH of COPD who presents with generalized weakness and decreased activity tolerance and decreased balance.  Will benefit from skilled PT in the acute setting to maximize independence and allow return home with spouse assist.    PT Assessment  Patient needs continued PT services    Follow Up Recommendations  No PT follow up;Supervision - Intermittent          Equipment Recommendations  None recommended by PT       Frequency Min 3X/week    Precautions / Restrictions Precautions Precaution Comments: Oxygen dependent   Pertinent Vitals/Pain See note for O2 qualifications      Mobility  Bed Mobility Details for Bed Mobility Assistance: Patient sitting at edge of bed Transfers Transfers: Sit to Stand;Stand to Sit Sit to Stand: From bed;Without upper extremity assist;From chair/3-in-1;With armrests;6: Modified independent (Device/Increase time) Stand to Sit: To bed;With armrests;With upper extremity assist;To chair/3-in-1;6: Modified independent (Device/Increase time) Ambulation/Gait Ambulation/Gait Assistance: 5: Supervision Ambulation Distance (Feet): 80 Feet (x 2) Assistive device: Other (Comment);None (versus occasional use of wall rail) Ambulation/Gait Assistance Details: mild imbalance with occasional reaching for UE support Gait Pattern: Step-through pattern;Decreased stride length    Shoulder Instructions     Exercises     PT Diagnosis: Abnormality of gait;Generalized weakness  PT Problem List: Decreased strength;Decreased activity tolerance;Decreased balance PT Treatment Interventions: Gait training;Stair training;Patient/family education;Therapeutic exercise;Balance training;Therapeutic activities    PT Goals Acute Rehab PT Goals PT Goal Formulation: With patient Time For Goal Achievement: 12/04/12 Potential to Achieve Goals: Good Pt will Ambulate: >150 feet;Independently PT Goal: Ambulate - Progress: Goal set today Pt will Go Up / Down Stairs: 1-2 stairs;Independently;with least restrictive assistive device PT Goal: Up/Down Stairs - Progress: Goal set today Pt will Perform Home Exercise Program: Independently PT Goal: Perform Home Exercise Program - Progress: Goal set today  Visit Information  Last PT Received On: 11/29/12    Subjective Data  Subjective: How am I supposed to get around without my oxygen? Patient Stated Goal: To go home    Prior Functioning  Home Living Lives With: Spouse Available Help at Discharge: Family Type of Home: House Home Access: Stairs to enter Secretary/administrator of Steps: 2 Entrance Stairs-Rails: None Home Layout: One level Bathroom Shower/Tub: Engineer, manufacturing systems: Standard Home Adaptive Equipment: Environmental consultant - rolling Prior Function Level of Independence: Independent Driving: No Vocation: On disability Communication Communication: No difficulties    Cognition  Overall Cognitive Status: Appears within functional limits for tasks assessed/performed Arousal/Alertness: Awake/alert Orientation Level: Appears intact for tasks assessed Behavior During Session: [facility] for tasks performed    Extremity/Trunk Assessment Right Upper Extremity Assessment RUE ROM/Strength/Tone: WFL for tasks assessed RUE Sensation: WFL - Light Touch Left Upper Extremity Assessment LUE ROM/Strength/Tone: WFL for tasks assessed LUE Sensation: WFL - Light Touch Right Lower Extremity Assessment RLE ROM/Strength/Tone: WFL for tasks assessed RLE Sensation: WFL - Light Touch Left Lower Extremity Assessment LLE ROM/Strength/Tone: WFL for tasks assessed LLE Sensation: WFL - Light Touch   Balance Balance Balance Assessed: Yes Static Standing  Balance Static Standing - Balance Support: No upper extremity supported Static Standing - Level of Assistance: 6: Modified independent (Device/Increase time) Static Standing - Comment/# of Minutes: standing for apporx 1 minute checking oxygen saturation   End of Session  PT - End of Session Activity Tolerance: Patient limited by fatigue (when ambulating without O2)  GP     Doshia Dalia,CYNDI 11/29/2012, 4:44 PM  [place], [place] [phone] 11/29/2012

## 2012-11-29 NOTE — Progress Notes (Signed)
SATURATION QUALIFICATIONS: (This note is used to comply with regulatory documentation for home oxygen)  Patient Saturations on Room Air at Rest = 88%  Patient Saturations on Room Air while Ambulating = 84%  Patient Saturations on 3 Liters of oxygen while Ambulating = 92%  Please briefly explain why patient needs home oxygen: Patient fatigues quickly with increased work of breathing with any ambulation even to bathroom in her room.  She would be unable to access community or participate in any ADL/IADL tasks in the home without supplemental oxygen.  [place], [place] [phone] 11/29/2012

## 2012-11-29 NOTE — Progress Notes (Signed)
[name] [name] to be D/C'd Home per MD order.  Discussed with the patient and all questions fully answered.   [place], [place]  Home Medication Instructions ZOX:[id number]   Printed on:11/29/12 1915  Medication Information                    albuterol (PROVENTIL) (5 MG/ML) 0.5% nebulizer solution Take 2.5 mg by nebulization every 6 (six) hours as needed. Wheezing or shortness of breath           ipratropium (ATROVENT) 0.02 % nebulizer solution Take 0.5 mg by nebulization every 6 (six) hours as needed. Shortness of breath           acetaminophen (TYLENOL) 500 MG tablet Take 1,000 mg by mouth every 6 (six) hours as needed. For pain           traZODone (DESYREL) 150 MG tablet Take 1 tablet (150 mg total) by mouth at bedtime.           citalopram (CELEXA) 40 MG tablet Take 1 tablet (40 mg total) by mouth at bedtime.           benzonatate (TESSALON) 100 MG capsule Take 1 capsule (100 mg total) by mouth 3 (three) times daily as needed for cough.           mometasone-formoterol (DULERA) 100-5 MCG/ACT AERO Inhale 2 puffs into the lungs 2 (two) times daily.           oseltamivir (TAMIFLU) 75 MG capsule Take 1 capsule (75 mg total) by mouth 2 (two) times daily.           predniSONE (DELTASONE) 20 MG tablet Take 2 tablets (40 mg total) by mouth daily with breakfast.           levofloxacin (LEVAQUIN) 500 MG tablet Take 1 tablet (500 mg total) by mouth daily.             VVS, Skin clean, dry and intact without evidence of skin break down, no evidence of skin tears noted. IV catheter discontinued intact. Site without signs and symptoms of complications. Dressing and pressure applied.  An After Visit Summary was printed and given to the patient. Follow up appointments , new prescriptions and medication administration times given Patient escorted via WC, and D/C home via private auto.  Cindra Eves, RN 11/29/2012 7:15 PM

## 2012-11-30 ENCOUNTER — Encounter: Payer: Self-pay | Admitting: Licensed Clinical Social Worker

## 2012-11-30 NOTE — Discharge Summary (Signed)
Internal Medicine Teaching [facility] Discharge Note  Name: [name] [name] MRN: [id number] DOB: 1955/12/09 57 y.o.  Date of Admission: 11/27/2012 11:18 AM Date of Discharge: 11/30/2012 Attending Physician: Margarito Liner, MD  Discharge Diagnosis: Principal Problem:  *Influenza A (H1N1) Active Problems:  COPD exacerbation  GERD (gastroesophageal reflux disease)  Morbid obesity  Depression  Anxiety disorder   Discharge Medications:   Medication List     As of 11/30/2012  1:50 PM    TAKE these medications         acetaminophen 500 MG tablet   Commonly known as: TYLENOL   Take 1,000 mg by mouth every 6 (six) hours as needed. For pain      albuterol (5 MG/ML) 0.5% nebulizer solution   Commonly known as: PROVENTIL   Take 2.5 mg by nebulization every 6 (six) hours as needed. Wheezing or shortness of breath      benzonatate 100 MG capsule   Commonly known as: TESSALON   Take 1 capsule (100 mg total) by mouth 3 (three) times daily as needed for cough.      citalopram 40 MG tablet   Commonly known as: CELEXA   Take 1 tablet (40 mg total) by mouth at bedtime.      ipratropium 0.02 % nebulizer solution   Commonly known as: ATROVENT   Take 0.5 mg by nebulization every 6 (six) hours as needed. Shortness of breath      levofloxacin 500 MG tablet   Commonly known as: LEVAQUIN   Take 1 tablet (500 mg total) by mouth daily.      mometasone-formoterol 100-5 MCG/ACT Aero   Commonly known as: DULERA   Inhale 2 puffs into the lungs 2 (two) times daily.      oseltamivir 75 MG capsule   Commonly known as: TAMIFLU   Take 1 capsule (75 mg total) by mouth 2 (two) times daily.      predniSONE 20 MG tablet   Commonly known as: DELTASONE   Take 2 tablets (40 mg total) by mouth daily with breakfast.      traZODone 150 MG tablet   Commonly known as: DESYREL   Take 1 tablet (150 mg total) by mouth at bedtime.         Disposition and follow-up:   [name] [name] was  discharged from [facility] in stable condition.  At the hospital follow up visit please address   - Please refer patient to pulmonology for PFTs  - Please discuss with the patient about adherence with her [facility] inhaler - She will also need evaluation for home oxygen requirement  - She might benefit from a referral to [facility] for better management/assistance. She has issues with affordability of her medications - Please discuss with patient about her mental health and treatment for her depression. There is a note from [facility] that she was supposed to stop taking trazodone but she was taking it at home. Had been switched to Celexa.    Follow-up Appointments: Follow-up Information    Follow up with HO,MICHELE, MD. On 12/10/2012. (at 9:15am)    Contact information:   Internal Medicine [facility] [address] [place] [address] [place] [address] [phone]         Discharge Orders    Future Appointments: Provider: Department: Dept Phone: Center:   12/10/2012 9:15 AM Mathis Dad, MD [facility] [phone] [facility]   12/27/2012 3:15 PM Ian Bushman Dierdre Searles, MD [place] [facility]  [phone] [facility]     Future Orders Please Complete By Expires   Diet - low sodium heart healthy      Increase activity slowly      Discharge instructions      Comments:   Use the oxygen when ambulating (walking) and moving about your home.  Fill the prescription for the antibiotics and inhaler Elwin Sleight) for COPD and flu.  Follow-up in the Internal Medicine Clinic as scheduled Dec 10, 2012 at 9:15.      Consultations:  None  Procedures Performed:  Dg Chest 2 View  11/27/2012  *RADIOLOGY REPORT*  Clinical Data: Shortness of breath.  COPD.  Left-sided chest pain.  CHEST - 2 VIEW  Comparison: 07/02/2012  Findings: The patient is rotated to the right on today's exam, resulting in reduced diagnostic sensitivity and specificity. Heart size within normal limits.  Stable mild  interstitial accentuation in the lungs.  No pleural effusion.  No airspace opacity.  IMPRESSION:  1.  Stable mild interstitial accentuation in the lungs appears chronic.   Otherwise, no significant abnormality identified.   Original Report Authenticated By: Gaylyn Rong, M.D.     Admission HPI:  [name] [name] is a 57 year old woman, with past medical history of COPD, obesity and depression who presents to the ED with complaints of worsening shortness of breath. She reports that at baseline she has some shortness of breath however, over the last 1 week this has significantly increased with sore throat, congestion, and some cough with thin whitish phlem. She also noted that she has started feeling tired and just feels like going to sleep all the time. She was seen in urgent care where she prescribed a Z-Pak 500 mg once daily for 4 days. She has not noted any improvement in her symptoms after using the medication for 3 days. At home, she usually uses oxygen when she is sick at 3 L per min and she has increased this to 4 L per minute since onset of symptoms, but she has not gotten any relief. She is not not on continuous home oxygen. She is very short of breath on exertion with just walking across the room. She denies contact with any sick person. She denies muscle aches. She reports some chest pain, mainly on the right side which is very mild in severity. She has not had any recent travels.  She denies history of leg swelling, PND, or palpitations. She denies history of syncope.  Review of Systems:  Constitutional: positive for anorexia, chills, fatigue, fevers, night sweats and sweats  Eyes: negative  Ears, nose, mouth, throat, and face: negative for ear drainage, earaches, facial trauma, voice change and no post nasal drip.  Respiratory: negative, as noted above in HPI  Cardiovascular: negative  Gastrointestinal: positive for vomiting, negative for reduced oral in take since onset of illiness.    Genitourinary:negative  Hematologic/lymphatic: negative  Musculoskeletal:negative for arthralgias, back pain, bone pain, muscle weakness, myalgias, neck pain and stiff joints  Neurological: negative  Endocrine: negative  Allergic/Immunologic: negative  Physical Exam:  Blood pressure 118/89, pulse 94, temperature 99 F (37.2 C), resp. rate 24, SpO2 92.00%.  General appearance: alert, cooperative, fatigued, moderate distress and breathing heavily with wheezes  Head: Normocephalic, without obvious abnormality, atraumatic  Nose: Nares normal. Septum midline. Mucosa normal. No drainage or sinus tenderness.  Throat: lips, mucosa, and tongue normal; teeth and gums normal  Neck: no adenopathy, no carotid bruit, no JVD, supple, symmetrical, trachea midline and thyroid not enlarged, symmetric, no  tenderness/mass/nodules  Lungs: wheezes bibasilar and bilaterally  Heart: regular rate and rhythm, S1, S2 normal, no murmur, click, rub or gallop  Abdomen: soft, non-tender; bowel sounds normal; no masses, no organomegaly  Pelvic: deferred  Extremities: extremities normal, atraumatic, no cyanosis or edema  Pulses: 2+ and symmetric  Skin: Skin color, texture, turgor normal. No rashes or lesions  Neurologic: Alert and oriented X 3, normal strength and tone. Normal symmetric reflexes. Normal coordination and gait   Hospital Course by problem list:  COPD exacerbation: Mr Gohman's presentation with cough, worsening shortness of breath, and some sputum production, was consistent with a COPD exacerbation. We suspected that the trigger for this exacerbation was an upper respiratory tract infection, most likely viral. She did not respond to her recent Z-Pak which had been prescribed as outpatient. She reported history of fevers and chills but she was afebrile on presentations. She had tachycardia with oxygen saturations of 91-92% on 2 L by nasal cannula of oxygen. Examination revealed widespread expiratory  wheezes. CBC white blood cell count of 6.7 which pointed away from this being a possible bacteria infection. Initial chest x-ray in the ED, did not show any acute pulmonary process. The possibility of this being from viral infection was high given the timing of her presentation in the winter. Other etiologies like such as myocardial ischemia, heart failure, aspiration, or pulmonary embolism where not suspected from the history, physical findings and tests. EKG was unchanged but reveal prolonged QTc. Her Influenze PCR came back positive for H1N1 and she was started on Tamiflu. She was also treated with Levaquin to complete 7 days and solumedrol and tessalon. She  clinically improved with less wheezing and her vital signs remained stable with oxygen saturation remaining the lower 90's. Elwin Sleight was added to he medication for better control of her COPD. She will follow up with out patient clinic for further management.   H1NI Influenza: She tested positive for H1N1 and she was started on Tamflu as stated above. It was the likely trigger of her COPD exacerbation. She will complete treatment from home.   Depression: Patient was recently evaluated by behavioral health and her medications were changed from trazodone 150 mg once daily to Celexa 40 mg at bedtime. However, the patient continued taking trazodone. She denied suicidal ideations. She was discharged on both Celexa and trazodone but this will need clarification as outpatient.    Discharge Vitals:  BP 136/78  Pulse 88  Temp 97.8 F (36.6 C) (Oral)  Resp 22  Ht 5\' 8"  (1.727 m)  Wt 300 lb 4.3 oz (136.2 kg)  BMI 45.66 kg/m2  SpO2 84%  Discharge Labs: No results found for this or any previous visit (from the past 24 hour(s)).  Signed: Dow Adolph 11/30/2012, 1:50 PM   Time Spent on Discharge: 30 minutes Services Ordered on Discharge: none  Equipment Ordered on Discharge: none

## 2012-11-30 NOTE — Progress Notes (Signed)
CSW received call this morning from pt's spouse, Onalee Hua.  Spouse states pt was d/c from [facility] yesterday and can not afford medications prescribed, cost would be over $200.  Pt was d/c on Tamiflu, Dulera inhaler, and Tessalon as being the most expensive.  Spouse inquiring if CSW is aware of any programs to offer assistance.  According to spouse, pt dropped prescription coverage due to overwhelming cost.  CSW discussed the ExtraHelp program and will mail information to pt for future.  CSW contact [facility] care management office, care management unable to assist because pt has Medicare coverage.  CSW was able to find coupon for $10 off Tamiflu, free trial Dulera inhaler, prednisone $4.78, Levofloxacin $12.19, benzonatate $6.55.  Per spouse request, CSW faxed coupons to [facility] on [facility] to see if any of them could be used for pt.  Also, discussed referral to [facility] for prescription assistance.  Family agreeable.  Referral sent to [facility] via email/high priority to see if any assistance can be offered.

## 2012-11-30 NOTE — Progress Notes (Signed)
Patient ID: [name] [name], female   DOB: Sep 15, 1956, 57 y.o.   MRN: [id number] Discharge date: 11/29/2012 Call date: 11/30/2012 Hospital follow up appointment date:   Calling to assist with transition of care from hospital to home.  Discharge medications reviewed:  Able to fill all prescriptions? No, cost too expensive.  Referral to [facility], see Social Work note  Patient aware of hospital follow up appointments.   No problems with transportation.  Other problems/concerns:

## 2012-12-10 ENCOUNTER — Ambulatory Visit (INDEPENDENT_AMBULATORY_CARE_PROVIDER_SITE_OTHER): Payer: Medicare Other | Admitting: Internal Medicine

## 2012-12-10 ENCOUNTER — Encounter: Payer: Self-pay | Admitting: Internal Medicine

## 2012-12-10 VITALS — BP 130/87 | HR 90 | Temp 97.6°F | Ht 68.0 in | Wt 303.1 lb

## 2012-12-10 DIAGNOSIS — F32A Depression, unspecified: Secondary | ICD-10-CM

## 2012-12-10 DIAGNOSIS — F3289 Other specified depressive episodes: Secondary | ICD-10-CM

## 2012-12-10 DIAGNOSIS — F329 Major depressive disorder, single episode, unspecified: Secondary | ICD-10-CM

## 2012-12-10 DIAGNOSIS — J441 Chronic obstructive pulmonary disease with (acute) exacerbation: Secondary | ICD-10-CM

## 2012-12-10 DIAGNOSIS — J101 Influenza due to other identified influenza virus with other respiratory manifestations: Secondary | ICD-10-CM

## 2012-12-10 DIAGNOSIS — F17200 Nicotine dependence, unspecified, uncomplicated: Secondary | ICD-10-CM

## 2012-12-10 DIAGNOSIS — Z720 Tobacco use: Secondary | ICD-10-CM

## 2012-12-10 MED ORDER — PREDNISONE 50 MG PO TABS: ORAL_TABLET | ORAL | Status: DC

## 2012-12-10 MED ORDER — DOXYCYCLINE HYCLATE 100 MG PO TABS: 100.0000 mg | ORAL_TABLET | Freq: Two times a day (BID) | ORAL | Status: AC

## 2012-12-10 NOTE — Assessment & Plan Note (Signed)
Stable, will continue Celexa 40mg . Denies any increased in depression or suicidal. She follows up with Dr. Elmon Kirschner- with psychiatry every 3 months

## 2012-12-10 NOTE — Assessment & Plan Note (Addendum)
I think she still has some residual from her COPD exacerbation with green sputum and wheezing.  She was supposed to take Tamiflu but did not fill her med (only got 2 days in the hospital). Patient continues to use home O2: 3L at home and will benefit from home O2.  She does have a portable system in which she uses within her home.  Patient quit smoking about 6 months ago. Her O2 sat did drop down to 84% on RA while ambulation documented while she was in hospital. -Prednisone 50mg  po qdaily x 5 days -Doxycycline 100mg  pobid x 7 days -Dulera inhaler -continue albuterol inhaler -Follow up in 2-3 weeks if no improvement. Goal is to treat as outpatient and avoid hospitalization -Schedule for PFT

## 2012-12-10 NOTE — Assessment & Plan Note (Signed)
She only had 2 days of Tamiflu and did not finish the other 3 days due to med cost.  She denies any myalgia or fever, chills, rhinorrhea, likely resolved.

## 2012-12-10 NOTE — Patient Instructions (Addendum)
Take Prednisone 50mg  one tablet daily x 5 days Take Doxycycline 100mg  one tablet twice daily x 7 days Will schedule for PULMONARY FUNCTION TEST Follow up with your primary care doctor in 2-3 weeks

## 2012-12-10 NOTE — Assessment & Plan Note (Signed)
Quit 6 months ago. 

## 2012-12-10 NOTE — Progress Notes (Signed)
Patient ID: [name] [name], female   DOB: Oct 21, 1956, 57 y.o.   MRN: [id number] [name] [name] is a 57 yo W who was recently admitted to hospital from 1/21-1/24 for COPD exacerbation and was found to have H1N1 positive. She was sent home with 7 days of Levo and prednisone which she finished.  She was treated with Tamiflu for 2 days and was sent home with 3 more days of Tamiflu however, she did not fill the medication due to cost. She is feeling better but still has green/yellow sputum (but less in quantity). Denies fever/chills/N/V.  She still has prob with breathing, using 3L continously at home.   She is compliant with her Dulera and Celexa.  Her depression is ok, denies suicidal or homicidal. She sees Dr. Lucienne Capers every 3 months.  She had a PFT done long time ago.  She states that she spoke to SW about [facility] already, she and her husband make too much money to qualify. [facility] Card: -Medicare wants record to document -continue to use home O2 -benefit from use of home O2 -if you have a portable system, you are using it within your home  ROS: as per HPI  PE: General: alert, well-developed, and cooperative to examination.  Lungs: normal respiratory effort, no accessory muscle use, normal breath sounds, no crackles, and +mild scattered wheezes. Heart: normal rate, regular rhythm, no murmur, no gallop, and no rub.  Abdomen: soft, non-tender, normal bowel sounds, no distention, no guarding, no rebound tenderness Neurologic: nonfocal Skin: turgor normal and no rashes.  Psych: appropriate

## 2012-12-12 ENCOUNTER — Ambulatory Visit (HOSPITAL_COMMUNITY)
Admission: RE | Admit: 2012-12-12 | Discharge: 2012-12-12 | Disposition: A | Discharge status: Home / Self Care | Payer: Medicare Other | Source: Ambulatory Visit | Attending: Internal Medicine | Admitting: Internal Medicine

## 2012-12-12 DIAGNOSIS — J441 Chronic obstructive pulmonary disease with (acute) exacerbation: Secondary | ICD-10-CM

## 2012-12-12 DIAGNOSIS — J449 Chronic obstructive pulmonary disease, unspecified: Secondary | ICD-10-CM | POA: Insufficient documentation

## 2012-12-12 DIAGNOSIS — J4489 Other specified chronic obstructive pulmonary disease: Secondary | ICD-10-CM | POA: Insufficient documentation

## 2012-12-12 MED ORDER — ALBUTEROL SULFATE (5 MG/ML) 0.5% IN NEBU
2.5000 mg | INHALATION_SOLUTION | Freq: Once | RESPIRATORY_TRACT | Status: AC
  Administered 2012-12-12: 2.5 mg via RESPIRATORY_TRACT

## 2012-12-21 ENCOUNTER — Other Ambulatory Visit (HOSPITAL_COMMUNITY): Payer: Self-pay | Admitting: *Deleted

## 2012-12-21 MED ORDER — CITALOPRAM HYDROBROMIDE 40 MG PO TABS: 40.0000 mg | ORAL_TABLET | Freq: Every day | ORAL | Status: DC

## 2012-12-24 ENCOUNTER — Ambulatory Visit (HOSPITAL_COMMUNITY): Payer: Self-pay | Admitting: Physician Assistant

## 2012-12-27 ENCOUNTER — Encounter: Payer: Self-pay | Admitting: Internal Medicine

## 2012-12-27 ENCOUNTER — Ambulatory Visit (INDEPENDENT_AMBULATORY_CARE_PROVIDER_SITE_OTHER): Payer: Medicare Other | Admitting: Internal Medicine

## 2012-12-27 VITALS — BP 122/76 | HR 91 | Temp 97.0°F | Ht 68.0 in | Wt 309.5 lb

## 2012-12-27 DIAGNOSIS — J441 Chronic obstructive pulmonary disease with (acute) exacerbation: Secondary | ICD-10-CM

## 2012-12-27 DIAGNOSIS — K219 Gastro-esophageal reflux disease without esophagitis: Secondary | ICD-10-CM

## 2012-12-27 MED ORDER — PANTOPRAZOLE SODIUM 40 MG PO TBEC: 40.0000 mg | DELAYED_RELEASE_TABLET | Freq: Every day | ORAL | Status: DC

## 2012-12-27 NOTE — Assessment & Plan Note (Signed)
-   will start PPI

## 2012-12-27 NOTE — Patient Instructions (Addendum)
1. Follow up in 3 months 2. Will start Protonix 40 mg daily. 3. patient will need home O2--will call advance today 4. Will request for PFT result 5. Will send for weight loss class.

## 2012-12-27 NOTE — Assessment & Plan Note (Signed)
-   will refer her weight loss class

## 2012-12-27 NOTE — Assessment & Plan Note (Addendum)
Stable now.   - PFT results pending - on BD and Dulera - will check her O2 with ambulation today-- 87% with ambulation - Sleep study- eval for desat at night.

## 2012-12-27 NOTE — Progress Notes (Signed)
Subjective:   Patient ID: [name] [name] female   DOB: May 25, 1956 57 y.o.   MRN: [id number]  HPI: [name] [name] is a 57 y.o.  woman with PMH of   Three COPD exacerbation 2013, COPD with home O2 and long hx of Tobacco abuse ( stopped smoking in April 2013) who presents to the clinic for follow up appointment.   Her most recent admission in 1/21-1/24 for COPD exacerbation and was found to have H1N1 positive. She was sent home with 7 days of Levo and prednisone which she finished.   She reports no complaints. States that she is doing well. She reports medical compliance to her treatment  1. COPD Stable, continue current regimen She would like to know her PFT test.  - Home O2 requirement - will call [facility] - she states that her oxygen saturation drops during the night when she sleeps.  2. Obesity, BMI 47 - will refer to weight loss class.   3. Depression- stable  4. GERD PPI     Past Medical History  Diagnosis Date  . COPD (chronic obstructive pulmonary disease)     last exacerbation 1 year ago, treated as outpatient  . Shortness of breath   . Recurrent upper respiratory infection (URI)   . Pneumonia   . GERD (gastroesophageal reflux disease)   . Insomnia   . Abdominal spasms     mid  . Depression    Current Outpatient Prescriptions  Medication Sig Dispense Refill  . acetaminophen (TYLENOL) 500 MG tablet Take 1,000 mg by mouth every 6 (six) hours as needed. For pain      . albuterol (PROVENTIL) (5 MG/ML) 0.5% nebulizer solution Take 2.5 mg by nebulization every 6 (six) hours as needed. Wheezing or shortness of breath      . benzonatate (TESSALON) 100 MG capsule Take 1 capsule (100 mg total) by mouth 3 (three) times daily as needed for cough.  30 capsule  0  . citalopram (CELEXA) 40 MG tablet Take 1 tablet (40 mg total) by mouth at bedtime.  30 tablet  1  . ipratropium (ATROVENT) 0.02 % nebulizer solution Take 0.5 mg by nebulization every 6 (six) hours as  needed. Shortness of breath      . mometasone-formoterol (DULERA) 100-5 MCG/ACT AERO Inhale 2 puffs into the lungs 2 (two) times daily.  1 Inhaler  3  . traZODone (DESYREL) 150 MG tablet Take 1 tablet (150 mg total) by mouth at bedtime.  90 tablet  0   No current facility-administered medications for this visit.   Family History  Problem Relation Age of Onset  . Breast cancer Mother   . Breast cancer Maternal Grandmother   . Breast cancer Maternal Aunt    History   Social History  . Marital Status: Married    Spouse Name: N/A    Number of Children: N/A  . Years of Education: N/A   Social History Main Topics  . Smoking status: Former Smoker    Types: Cigarettes    Quit date: 02/05/2012  . Smokeless tobacco: Never Used     Comment: states she has already quit smoking  . Alcohol Use: No  . Drug Use: No  . Sexually Active: Not Currently    Birth Control/ Protection: Post-menopausal   Other Topics Concern  . None   Social History Narrative   lives in [place] with her husband. Has one son who is 54 years old. Used to work in [employer], on disability  now. Has Medicare. Smokes 2 packs per day for past 40 years. Has not had a cigarette last 3 weeks since she got sick. Never drank alcohol. Never did  drugs.   Review of Systems: Review of Systems:  Constitutional:  Denies fever, chills, diaphoresis, appetite change and fatigue.   HEENT:  Denies congestion, sore throat, rhinorrhea, sneezing, mouth sores, trouble swallowing, neck pain   Respiratory:  Denies SOB, DOE, cough, and wheezing.   Cardiovascular:  Denies palpitations and leg swelling.   Gastrointestinal:  Denies nausea, vomiting, abdominal pain, diarrhea, constipation, blood in stool and abdominal distention.   Genitourinary:  Denies dysuria, urgency, frequency, hematuria, flank pain and difficulty urinating.   Musculoskeletal:  Denies myalgias, back pain, joint swelling, arthralgias and gait problem.   Skin:  Denies  pallor, rash and wound.   Neurological:  Denies dizziness, seizures, syncope, weakness, light-headedness, numbness and headaches.    .    Objective:  Physical Exam: Filed Vitals:   12/27/12 1535  BP: 122/76  Pulse: 91  Temp: 97 F (36.1 C)  TempSrc: Oral  Height: 5\' 8"  (1.727 m)  Weight: 309 lb 8 oz (140.388 kg)  SpO2: 94%   General: morbid obese  Head: normocephalic and atraumatic.  Eyes: vision grossly intact, pupils equal, pupils round, pupils reactive to light, no injection and anicteric.  Mouth: pharynx pink and moist, no erythema, and no exudates.  Neck: supple, full ROM, no thyromegaly, no JVD, and no carotid bruits.  Lungs: normal respiratory effort, no accessory muscle use, normal breath sounds, no crackles, and no wheezes. Heart: normal rate, regular rhythm, no murmur, no gallop, and no rub.  Abdomen: soft, non-tender, normal bowel sounds, no distention, no guarding, no rebound tenderness, no hepatomegaly, and no splenomegaly.  Msk: no joint swelling, no joint warmth, and no redness over joints.  Pulses: 2+ DP/PT pulses bilaterally Extremities: No cyanosis, clubbing, edema Neurologic: alert & oriented X3, cranial nerves II-XII intact, strength normal in all extremities, sensation intact to light touch, and gait normal.  Skin: turgor normal and no rashes.  Psych: Oriented X3, memory intact for recent and remote, normally interactive, good eye contact, not anxious appearing, and not depressed appearing.    Assessment & Plan:

## 2013-01-09 ENCOUNTER — Other Ambulatory Visit: Payer: Self-pay | Admitting: Internal Medicine

## 2013-01-09 DIAGNOSIS — J441 Chronic obstructive pulmonary disease with (acute) exacerbation: Secondary | ICD-10-CM

## 2013-02-03 NOTE — Progress Notes (Signed)
   [facility]al Health Follow-up Outpatient Visit  [name] [name] 1955/11/10  Date: 10/23/2012   Subjective: Latrish presents today to followup on her treatment for anxiety and depression. She reports that her anxiety has decreased somewhat. She had stopped taking Celexa for a period of about 2 weeks because she had to take Percocet and feels that the 2 medications together would be too sedating. She reports that while on the Celexa she experienced an increase in her anxiety and depression. She has now resumed taking the Celexa for the past 2 days. She reports that her sleep has been poor, and that she wakes several times a night. She endorses a good appetite. While off the medications she reports she was experiencing suicidal ideation and had a plan to overdose. She denies any current SI/HI or auditory or visual hallucinations.  There were no vitals filed for this visit.  Mental Status Examination  Appearance: casual Alert: Yes Attention: good  Cooperative: Yes Eye Contact: Good Speech: clear and coherent Psychomotor Activity: Normal Memory/Concentration: intact Oriented: person, place, time/date and situation Mood: Dysphoric Affect: Congruent Thought Processes and Associations: Linear Fund of Knowledge: Good Thought Content: normal Insight: Fair Judgement: Fair  Diagnosis: generalized anxiety disorder; major depressive disorder, recurrent, moderate  Treatment Plan: we'll increase her trazodone to 150 mg at bedtime, and if her sleep is not improved we will increase her Celexa to 40 mg. She will return for followup in 2 months.  Kareem Cathey, PA-C

## 2013-02-04 ENCOUNTER — Encounter: Payer: Self-pay | Admitting: Internal Medicine

## 2013-02-04 DIAGNOSIS — E785 Hyperlipidemia, unspecified: Secondary | ICD-10-CM | POA: Insufficient documentation

## 2013-02-07 ENCOUNTER — Encounter (HOSPITAL_COMMUNITY): Payer: Self-pay | Admitting: Physician Assistant
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
## 2013-02-12 ENCOUNTER — Encounter (HOSPITAL_BASED_OUTPATIENT_CLINIC_OR_DEPARTMENT_OTHER): Payer: Self-pay

## 2013-02-18 ENCOUNTER — Encounter: Payer: Self-pay | Admitting: Internal Medicine

## 2013-02-19 ENCOUNTER — Ambulatory Visit (INDEPENDENT_AMBULATORY_CARE_PROVIDER_SITE_OTHER): Payer: Medicare Other | Admitting: Physician Assistant

## 2013-02-19 DIAGNOSIS — F410 Panic disorder [episodic paroxysmal anxiety] without agoraphobia: Secondary | ICD-10-CM

## 2013-02-19 DIAGNOSIS — F330 Major depressive disorder, recurrent, mild: Secondary | ICD-10-CM

## 2013-02-19 DIAGNOSIS — F331 Major depressive disorder, recurrent, moderate: Secondary | ICD-10-CM

## 2013-02-19 MED ORDER — CITALOPRAM HYDROBROMIDE 40 MG PO TABS: 40.0000 mg | ORAL_TABLET | Freq: Every day | ORAL | Status: DC

## 2013-02-19 NOTE — Progress Notes (Signed)
   [facility] Behavioral Health Follow-up Outpatient Visit  LUBERTA GRABINSKI 1956/05/25  Date: 02/19/2013   Subjective: Annaelle presents today to followup on her treatment for depression and anxiety. She canceled her last appointment about 2 months ago because she was sick. The last time he she was seen in December we increased her trazodone to 150 mg to improve her sleep. She reports that she is sleeping better, and endorses about 5 hours per night. She reports that as long she can get that amount without waking up she is good. She also endorses a good appetite. She states that her mood fluctuates, and seems to depend somewhat on her situation at the moment. She endorses a significant amount of irritability, and reports that her anxiety has been increased. She feels that her depression is better at the moment. She reports that she is not interested in counseling at this time as she has found it was not helpful in the past  There were no vitals filed for this visit.  Mental Status Examination  Appearance: Casual Alert: Yes Attention: good  Cooperative: Yes Eye Contact: Good Speech: Clear and coherent Psychomotor Activity: Normal Memory/Concentration: Intact Oriented: person, place, time/date and situation Mood: Anxious Affect: Congruent Thought Processes and Associations: Linear Fund of Knowledge: Good Thought Content: Normal Insight: Fair Judgement: Good  Diagnosis: Maj. depressive disorder, recurrent, mild; panic disorder  Treatment Plan: We will continue her current medications, trazodone 150 mg at bedtime, and Celexa 40 mg daily. She will return for followup in 3 months.  Muslima Toppins, PA-C

## 2013-03-06 ENCOUNTER — Encounter (HOSPITAL_BASED_OUTPATIENT_CLINIC_OR_DEPARTMENT_OTHER): Payer: Self-pay

## 2013-03-14 ENCOUNTER — Emergency Department (HOSPITAL_COMMUNITY): Payer: Medicare Other

## 2013-03-14 ENCOUNTER — Encounter (HOSPITAL_COMMUNITY): Payer: Self-pay | Admitting: Nurse Practitioner

## 2013-03-14 ENCOUNTER — Emergency Department (HOSPITAL_COMMUNITY)
Admission: EM | Admit: 2013-03-14 | Discharge: 2013-03-14 | Disposition: A | Discharge status: Home / Self Care | Payer: Medicare Other | Attending: Emergency Medicine | Admitting: Emergency Medicine

## 2013-03-14 DIAGNOSIS — D485 Neoplasm of uncertain behavior of skin: Secondary | ICD-10-CM | POA: Insufficient documentation

## 2013-03-14 DIAGNOSIS — W010XXA Fall on same level from slipping, tripping and stumbling without subsequent striking against object, initial encounter: Secondary | ICD-10-CM | POA: Insufficient documentation

## 2013-03-14 DIAGNOSIS — F3289 Other specified depressive episodes: Secondary | ICD-10-CM | POA: Insufficient documentation

## 2013-03-14 DIAGNOSIS — Y998 Other external cause status: Secondary | ICD-10-CM | POA: Insufficient documentation

## 2013-03-14 DIAGNOSIS — F329 Major depressive disorder, single episode, unspecified: Secondary | ICD-10-CM | POA: Insufficient documentation

## 2013-03-14 DIAGNOSIS — Z79899 Other long term (current) drug therapy: Secondary | ICD-10-CM | POA: Insufficient documentation

## 2013-03-14 DIAGNOSIS — Z886 Allergy status to analgesic agent status: Secondary | ICD-10-CM | POA: Insufficient documentation

## 2013-03-14 DIAGNOSIS — G4700 Insomnia, unspecified: Secondary | ICD-10-CM | POA: Insufficient documentation

## 2013-03-14 DIAGNOSIS — S8000XA Contusion of unspecified knee, initial encounter: Secondary | ICD-10-CM | POA: Insufficient documentation

## 2013-03-14 DIAGNOSIS — J4489 Other specified chronic obstructive pulmonary disease: Secondary | ICD-10-CM | POA: Insufficient documentation

## 2013-03-14 DIAGNOSIS — Z87891 Personal history of nicotine dependence: Secondary | ICD-10-CM | POA: Insufficient documentation

## 2013-03-14 DIAGNOSIS — Z8701 Personal history of pneumonia (recurrent): Secondary | ICD-10-CM | POA: Insufficient documentation

## 2013-03-14 DIAGNOSIS — M25561 Pain in right knee: Secondary | ICD-10-CM

## 2013-03-14 DIAGNOSIS — E669 Obesity, unspecified: Secondary | ICD-10-CM | POA: Insufficient documentation

## 2013-03-14 DIAGNOSIS — Y92009 Unspecified place in unspecified non-institutional (private) residence as the place of occurrence of the external cause: Secondary | ICD-10-CM | POA: Insufficient documentation

## 2013-03-14 DIAGNOSIS — J449 Chronic obstructive pulmonary disease, unspecified: Secondary | ICD-10-CM

## 2013-03-14 DIAGNOSIS — D229 Melanocytic nevi, unspecified: Secondary | ICD-10-CM

## 2013-03-14 DIAGNOSIS — K219 Gastro-esophageal reflux disease without esophagitis: Secondary | ICD-10-CM | POA: Insufficient documentation

## 2013-03-14 NOTE — ED Notes (Addendum)
Pt reports she tripped over a dog toy and landed on R knee and having R knee pain since. Ambulatory, cms intact. Has been icing and elevating with no relief
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
## 2013-03-15 NOTE — ED Provider Notes (Signed)
Medical screening examination/treatment/procedure(s) were performed by non-physician practitioner and as supervising physician I was immediately available for consultation/collaboration.  Juliet Rude. Rubin Payor, MD 03/15/13 [phone]

## 2013-03-19 ENCOUNTER — Encounter: Payer: Self-pay | Admitting: Internal Medicine

## 2013-03-26 ENCOUNTER — Ambulatory Visit (HOSPITAL_BASED_OUTPATIENT_CLINIC_OR_DEPARTMENT_OTHER): Payer: Medicare Other | Attending: Internal Medicine | Admitting: Radiology

## 2013-03-26 VITALS — Ht 68.0 in | Wt 309.0 lb

## 2013-03-26 DIAGNOSIS — J3489 Other specified disorders of nose and nasal sinuses: Secondary | ICD-10-CM | POA: Insufficient documentation

## 2013-03-26 DIAGNOSIS — G4761 Periodic limb movement disorder: Secondary | ICD-10-CM | POA: Insufficient documentation

## 2013-03-26 DIAGNOSIS — J441 Chronic obstructive pulmonary disease with (acute) exacerbation: Secondary | ICD-10-CM

## 2013-03-26 DIAGNOSIS — G4733 Obstructive sleep apnea (adult) (pediatric): Secondary | ICD-10-CM | POA: Insufficient documentation

## 2013-03-30 DIAGNOSIS — R0609 Other forms of dyspnea: Secondary | ICD-10-CM

## 2013-03-30 DIAGNOSIS — G4733 Obstructive sleep apnea (adult) (pediatric): Secondary | ICD-10-CM

## 2013-03-30 DIAGNOSIS — G4761 Periodic limb movement disorder: Secondary | ICD-10-CM

## 2013-03-30 DIAGNOSIS — R0989 Other specified symptoms and signs involving the circulatory and respiratory systems: Secondary | ICD-10-CM

## 2013-03-30 NOTE — Procedures (Signed)
NAME:  [name] [name], [name] [name]              ACCOUNT NO.:  [id number]  MEDICAL RECORD NO.:  [id number]          PATIENT TYPE:  OUT  LOCATION:  SLEEP CENTER                 FACILITY:  [facility]  PHYSICIAN:  Cael Worth D. Maple Hudson, MD, FCCP, FACPDATE OF BIRTH:  10-12-55  DATE OF STUDY:  03/26/2013                           NOCTURNAL POLYSOMNOGRAM  REFERRING PHYSICIAN:  Blanch Media, M.D.  REFERRING PHYSICIAN:  Dede Query, MD  INDICATION FOR STUDY:  Hypersomnia with sleep apnea.  EPWORTH SLEEPINESS SCORE:  4/24.  BMI 47.  Weight 309 pounds.  Height 68 inches, neck 16.5 inches.  MEDICATIONS:  Home medications are charted and reviewed.  SLEEP ARCHITECTURE: Total sleep time 332.5 minutes with sleep efficiency 85.1%.  Stage I was 4.5%.  Stage II 77%.  Stage III 18.5%. REM absent.  Sleep latency 19.5 minutes. Awake after sleep onset 38.5 minutes.  Arousal index 12.8. Bedtime medication:  Celexa, Desyrel.  RESPIRATORY DATA:  Apnea-hypopnea index (AHI) 6.1 per hour. A total of 34 events was scored including 10 obstructive apneas and 24 hypopneas. Events were more common while supine.  This was a diagnostic NPSG protocol as ordered, and CPAP titration was not done.  OXYGEN DATA:  She was placed on supplemental oxygen at 2310 p.m. because of desaturation into the 80% range.  Oxygen desaturation was to a nadir of 84% with mean oxygen saturation through the study of 90.5% on 2 L. Moderate to loud snoring.  CARDIAC DATA:  Sinus rhythm.  MOVEMENT-PARASOMNIA:  A total of 542 limb jerks were counted, of which 30 were associated with arousal or awakenings for periodic limb movement with arousal index of 5.4 per hour.  Bathroom x1.  IMPRESSIONS-RECOMMENDATIONS: 1. Sleep architecture was significant for absence of REM sleep.     Bedtime medication included Celexa and Desyrel. 2. Mild obstructive sleep apnea/hypopnea syndrome, AHI 6.1 per hour     with events in all sleep positions.  Moderate to loud snoring.   3. On arrival on room air while awake, saturation was 92%.  With sleep     onset, she promptly desaturated to a nadir of 84%.  The technician     added supplemental oxygen at 2 L which held saturation for a mean     on room air of 90.5% through the rest of the study.  Consider     possibility of underlying lung disease or obesity hypoventilation     syndrome. 4. The study was ordered as a diagnostic NPSG protocol.  An AHI of 6.1     would usually be addressed first with conservative measures such as     weight loss, treatment for nasal congestion and encouragement to     sleep off flat of back.  In the context of significant oxygen     desaturation and significant snoring, an earlier trial of CPAP     therapy might be considered appropriate.  The patient can return     for dedicated CPAP titration study if indicated. 5. Significant periodic limb movement syndrome with arousal.  A total     of 542 limb jerks were counted, of which 30 were associated with     arousals or awakening for  periodic limb movement with arousal index     of 5.4 per hour.  This may respond to treatment for respiratory     abnormalities, but otherwise consider a therapeutic trial of     specific therapy such as ReQuip or Mirapex if appropriate.     Hadyn Blanck D. Maple Hudson, MD, [facility], FACP Diplomate, American Board of Sleep Medicine    CDY/MEDQ  D:  03/30/2013 11:28:01  T:  03/30/2013 12:18:14  Job:  [id number]

## 2013-04-04 ENCOUNTER — Other Ambulatory Visit: Payer: Self-pay | Admitting: Internal Medicine

## 2013-04-04 DIAGNOSIS — G4733 Obstructive sleep apnea (adult) (pediatric): Secondary | ICD-10-CM

## 2013-04-24 ENCOUNTER — Telehealth: Payer: Self-pay | Admitting: *Deleted

## 2013-04-24 NOTE — Telephone Encounter (Signed)
I called and left a message on her phone for her to call back to the clinic tomorrow to set up appt with me during the continuity clinic on 07/01and 07/02.

## 2013-04-24 NOTE — Telephone Encounter (Signed)
Pt calls and would like dr Dierdre Searles to call her to discuss resp test results at [phone]

## 2013-05-07 ENCOUNTER — Ambulatory Visit (HOSPITAL_BASED_OUTPATIENT_CLINIC_OR_DEPARTMENT_OTHER): Payer: Medicare Other | Attending: Internal Medicine | Admitting: Radiology

## 2013-05-07 VITALS — Ht 68.0 in | Wt 310.0 lb

## 2013-05-07 DIAGNOSIS — G4710 Hypersomnia, unspecified: Secondary | ICD-10-CM | POA: Insufficient documentation

## 2013-05-07 DIAGNOSIS — G4733 Obstructive sleep apnea (adult) (pediatric): Secondary | ICD-10-CM

## 2013-05-07 DIAGNOSIS — G4761 Periodic limb movement disorder: Secondary | ICD-10-CM | POA: Insufficient documentation

## 2013-05-08 ENCOUNTER — Ambulatory Visit (INDEPENDENT_AMBULATORY_CARE_PROVIDER_SITE_OTHER): Payer: Medicare Other | Admitting: Internal Medicine

## 2013-05-08 ENCOUNTER — Encounter: Payer: Self-pay | Admitting: Internal Medicine

## 2013-05-08 VITALS — BP 131/86 | HR 77 | Temp 96.8°F | Ht 67.5 in | Wt 317.5 lb

## 2013-05-08 DIAGNOSIS — J441 Chronic obstructive pulmonary disease with (acute) exacerbation: Secondary | ICD-10-CM

## 2013-05-08 DIAGNOSIS — R358 Other polyuria: Secondary | ICD-10-CM | POA: Insufficient documentation

## 2013-05-08 DIAGNOSIS — E785 Hyperlipidemia, unspecified: Secondary | ICD-10-CM

## 2013-05-08 DIAGNOSIS — R3589 Other polyuria: Secondary | ICD-10-CM | POA: Insufficient documentation

## 2013-05-08 LAB — GLUCOSE, CAPILLARY: Glucose-Capillary: 97 mg/dL (ref 70–99)

## 2013-05-08 MED ORDER — BUDESONIDE-FORMOTEROL FUMARATE 160-4.5 MCG/ACT IN AERO: 2.0000 | INHALATION_SPRAY | Freq: Two times a day (BID) | RESPIRATORY_TRACT | Status: DC

## 2013-05-08 MED ORDER — TIOTROPIUM BROMIDE MONOHYDRATE 18 MCG IN CAPS: 18.0000 ug | ORAL_CAPSULE | Freq: Every day | RESPIRATORY_TRACT | Status: DC
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
## 2013-05-08 NOTE — Assessment & Plan Note (Addendum)
Patient's clinical manifestation is worrisome for DM. Last A1C was 6.3 in 2013.  - will check her A1C, TSH

## 2013-05-08 NOTE — Assessment & Plan Note (Addendum)
Gold Stage 3 COPD. Home O2 at night.  FEV1 48% predicted  low FVC 52% pred likely rated to air trapping.  - Patient reports baseline SOB and DOE, was on albuterol, atrovent Neb and Dulera. But she stopped Dulera without counseling any provider.  - will start her on Symbicort (medicare preference level 1) and Spiriva -patient is instructed to stop Atrovent once she is able to fill spiriva.  - will send Pulmonology referral per patient request.

## 2013-05-08 NOTE — Patient Instructions (Addendum)
1. Will check your LFT, FLP, A1C and TSH 2. Follow up in August.

## 2013-05-09 LAB — HEPATIC FUNCTION PANEL
Alkaline Phosphatase: 126 U/L — ABNORMAL HIGH (ref 39–117)
Bilirubin, Direct: 0.1 mg/dL (ref 0.0–0.3)
Indirect Bilirubin: 0.3 mg/dL (ref 0.0–0.9)
Total Bilirubin: 0.4 mg/dL (ref 0.3–1.2)
Total Protein: 6.4 g/dL (ref 6.0–8.3)

## 2013-05-09 LAB — LIPID PANEL
LDL Cholesterol: 129 mg/dL — ABNORMAL HIGH (ref 0–99)
Triglycerides: 124 mg/dL (ref <150)
VLDL: 25 mg/dL (ref 0–40)

## 2013-05-09 LAB — TSH: TSH: 2.832 u[IU]/mL (ref 0.350–4.500)

## 2013-05-11 DIAGNOSIS — G4733 Obstructive sleep apnea (adult) (pediatric): Secondary | ICD-10-CM

## 2013-05-12 NOTE — Procedures (Signed)
NAME:  [name] [name], [name] [name]              ACCOUNT NO.:  [id number]  MEDICAL RECORD NO.:  [id number]          PATIENT TYPE:  OUT  LOCATION:  SLEEP CENTER                 FACILITY:  [facility]  PHYSICIAN:  Lamere Lightner D. Maple Hudson, MD, FCCP, FACPDATE OF BIRTH:  03[birth year]26/1956  DATE OF STUDY:  05/07/2013                           NOCTURNAL POLYSOMNOGRAM  REFERRING PHYSICIAN:  Dede Query, MD  INDICATION FOR STUDY:  Hypersomnia with sleep apnea.  At baseline, diagnostic NPSG on Mar 26, 2013 recorded AHI 6.1 per hour.  Body weight was 309 pounds for that study.  CPAP titration is requested.  EPWORTH SLEEPINESS SCORE:  MEDICATIONS:  Home medications charted for review.  SLEEP ARCHITECTURE:  Total sleep time 347 minutes with sleep efficiency 91%.  Stage I was 6.6%, stage II 72.2%,  stage III 2.9%, REM 18.3% of total sleep time.  Sleep latency 4.5 minutes, REM latency 31.3 minutes. Awake after sleep onset 27 minutes.  Arousal index 14.5.  Bedtime medication:  Celexa, trazodone.  RESPIRATORY DATA:  CPAP titration protocol.  CPAP was titrated to 16 CWP, AHI 0 per hour.  She wore a medium ResMed Quattro FX full-face mask with heated humidifier.  OXYGEN DATA:  Snoring was prevented and mean oxygen saturation held 90.8% on room air with CPAP.  CARDIAC DATA:  Normal sinus rhythm.  MOVEMENT-PARASOMNIA:  Frequent limb jerks.  A total of 416 limb jerks were counted of which 26 were associated with arousal for awakening for periodic limb movements with arousal index of 4.5 per hour.  Bathroom x1.  IMPRESSIONS-RECOMMENDATIONS: 1. Successful CPAP titration to 16 CWP, AHI 0 per hour.  She wore a     medium ResMed Quattro FX full-face mask with heated humidifier.     Snoring was prevented and mean oxygen saturation held 90.8% on room     air. 2. Periodic limb movement with arousal syndrome:  416 limb jerks of     which 26 were associated with arousal or awakening for periodic     limb movement with arousal index of 4.5  per hour.  If this pattern     persists in the home environment after adjustment to CPAP, then a     specific therapy such as Requip might be considered. 3. Baseline diagnostic NPSG on Mar 26, 2013 had recorded an AHI of 6.1     per hour.  Body weight was 309 pounds for that study.     Vayda Dungee D. Maple Hudson, MD, [facility], FACP Diplomate, American Board of Sleep Medicine    CDY/MEDQ  D:  05/11/2013 09:51:45  T:  05/12/2013 06:30:16  Job:  [id number]

## 2013-05-13 ENCOUNTER — Telehealth: Payer: Self-pay | Admitting: *Deleted

## 2013-05-13 NOTE — Telephone Encounter (Signed)
Pt called to check on labs. Aware Dr Dierdre Searles is not in clinic and will send message for Dr Dierdre Searles to call pt. Stanton Kidney Gwen Edler RN 05/13/13 4PM

## 2013-05-13 NOTE — Progress Notes (Signed)
Case discussed with Dr. Li soon after the resident saw the patient. We reviewed the resident's history and exam and pertinent patient test results. I agree with the assessment, diagnosis, and plan of care documented in the resident's note. 

## 2013-05-18 NOTE — Telephone Encounter (Signed)
Hi, I tried to call her over past couple of days and unable to reach her. I left the message on her phone.  Would you please call and go over her lab work with her? I am in away rotation now.  Thanks   Dr. Dierdre Searles

## 2013-05-21 ENCOUNTER — Ambulatory Visit (HOSPITAL_COMMUNITY): Payer: Self-pay | Admitting: Physician Assistant

## 2013-05-22 ENCOUNTER — Encounter (HOSPITAL_COMMUNITY): Payer: Self-pay

## 2013-05-28 ENCOUNTER — Ambulatory Visit (INDEPENDENT_AMBULATORY_CARE_PROVIDER_SITE_OTHER): Payer: Medicare Other | Admitting: Internal Medicine

## 2013-05-28 ENCOUNTER — Encounter: Payer: Self-pay | Admitting: Internal Medicine

## 2013-05-28 ENCOUNTER — Ambulatory Visit (HOSPITAL_COMMUNITY): Payer: Self-pay | Admitting: Physician Assistant

## 2013-05-28 VITALS — BP 122/80 | HR 81 | Temp 97.5°F | Ht 67.0 in | Wt 318.0 lb

## 2013-05-28 DIAGNOSIS — J441 Chronic obstructive pulmonary disease with (acute) exacerbation: Secondary | ICD-10-CM

## 2013-05-28 DIAGNOSIS — J449 Chronic obstructive pulmonary disease, unspecified: Secondary | ICD-10-CM | POA: Insufficient documentation

## 2013-05-28 DIAGNOSIS — J4530 Mild persistent asthma, uncomplicated: Secondary | ICD-10-CM

## 2013-05-28 DIAGNOSIS — J45909 Unspecified asthma, uncomplicated: Secondary | ICD-10-CM | POA: Insufficient documentation

## 2013-05-28 NOTE — Progress Notes (Signed)
  Subjective:    Patient ID: [name] [name], female    DOB: 30-Oct-1956   MRN: [id number]  HPI  27 yowf quit smoking 11/2012 referred 05/28/2013 to pulmonary clinic for copd eval by Dr Nedra Hai at [facility] with no sign airflow obst p albuterol by pfts 12/12/12   05/28/2013 1st pulmonary eval cc gradual onset doe x 12 years (wt 180 at onset) better p quit and doing ok walking at mall x 15 m then sits down, no better after rehab, some better p saba and dulera  Some variability when upset, with colds which settle in the chest. , with sensation of globus and intermittent hb already on ppi  No other obvious pattern to day to day variabilty or assoc purulent or excess mucus or cp or chest tightness, subjective wheeze overt sinus symptoms. No unusual exp hx or h/o childhood pna/ asthma or knowledge of premature birth.   .  Sleeping ok without nocturnal  or early am exacerbation  of respiratory  c/o's or need for noct saba. Also denies any obvious fluctuation of symptoms with weather or environmental changes or other aggravating or alleviating factors except as outlined above   Review of Systems  Constitutional: Negative for fever, chills and unexpected weight change.  HENT: Positive for congestion, sneezing and trouble swallowing. Negative for ear pain, nosebleeds, sore throat, rhinorrhea, dental problem, voice change, postnasal drip and sinus pressure.   Eyes: Negative for visual disturbance.  Respiratory: Positive for cough and shortness of breath. Negative for choking.   Cardiovascular: Negative for chest pain and leg swelling.  Gastrointestinal: Positive for abdominal pain. Negative for vomiting and diarrhea.  Genitourinary: Negative for difficulty urinating.       Acid Heartburn Indigestion  Musculoskeletal: Negative for arthralgias.  Skin: Negative for rash.  Neurological: Negative for tremors, syncope and headaches.  Hematological: Does not bruise/bleed easily.  Psychiatric/Behavioral: The patient  is nervous/anxious.        Objective:   Physical Exam Wt Readings from Last 3 Encounters:  05/28/13 318 lb (144.244 kg)  05/08/13 317 lb 8 oz (144.017 kg)  05/07/13 310 lb (140.615 kg)    Obese wf nad   HEENT: nl dentition, turbinates, and orophanx. Nl external ear canals without cough reflex   NECK :  without JVD/Nodes/TM/ nl carotid upstrokes bilaterally   LUNGS: no acc muscle use, clear to A and P bilaterally without cough on insp or exp maneuvers   CV:  RRR  no s3 or murmur or increase in P2, no edema   ABD:  Obese soft and nontender with nl excursion in the supine position. No bruits or organomegaly, bowel sounds nl  MS:  warm without deformities, calf tenderness, cyanosis or clubbing  SKIN: warm and dry without lesions    NEURO:  alert, approp, no deficits   Nov 27 2012 cxr 1. Stable mild interstitial accentuation in the lungs appears  chronic. Otherwise, no significant abnormality identified.       Assessment & Plan:

## 2013-05-28 NOTE — Patient Instructions (Addendum)
dulera up to 2 puffs every 12 hours if needed for breathing attacks for any reason and if not better return.  You do not have significant copd and you never will unless you start smoking again  Weight control is simply a matter of calorie balance which needs to be tilted in your favor by eating less and exercising more.  To get the most out of exercise, you need to be continuously aware that you are short of breath, but never out of breath, for 30 minutes daily. As you improve, it will actually be easier for you to do the same amount of exercise  in  30 minutes so always push to the level where you are short of breath.  If this does not result in gradual weight reduction then I strongly recommend you see a nutritionist with a food diary x 2 weeks so that we can work out a negative calorie balance which is universally effective in steady weight loss programs.  Think of your calorie balance like you do your bank account where in this case you want the balance to go down so you must take in less calories than you burn up.  It's just that simple:  Hard to do, but easy to understand.  Good luck!

## 2013-05-29 NOTE — Assessment & Plan Note (Signed)
Reviewed with pt how to reverse this as it's likely the largest component of her pulmonary function abnormality"  Weight control is simply a matter of calorie balance which needs to be tilted in your favor by eating less and exercising more.  To get the most out of exercise, you need to be continuously aware that you are short of breath, but never out of breath, for 30 minutes daily. As you improve, it will actually be easier for you to do the same amount of exercise  in  30 minutes so always push to the level where you are short of breath.  If this does not result in gradual weight reduction then I strongly recommend you see a nutritionist with a food diary x 2 weeks so that we can work out a negative calorie balance which is universally effective in steady weight loss programs.  Think of your calorie balance like you do your bank account where in this case you want the balance to go down so you must take in less calories than you burn up.  It's just that simple:  Hard to do, but easy to understand.  Good luck!

## 2013-05-29 NOTE — Assessment & Plan Note (Addendum)
-   12/12/12  FEV1 1.74 p B2 (57%) with ratio 73 and  (an18% response)  Despite smoking hx she has no significant airflow obst p saba so this is best characterized as asthma with a restrictive component secondary to obesity, not "COPD"  Should be able to maintain optimal airflow with dulera optimally the 100 2bid dose if learns proper technique  The proper method of use, as well as anticipated side effects, of a metered-dose inhaler are discussed and demonstrated to the patient. Improved effectiveness after extensive coaching during this visit to a level of approximately  75%

## 2013-05-30 ENCOUNTER — Ambulatory Visit (INDEPENDENT_AMBULATORY_CARE_PROVIDER_SITE_OTHER): Payer: Medicare Other | Admitting: Physician Assistant

## 2013-05-30 VITALS — BP 104/69 | HR 82 | Ht 67.0 in | Wt 314.4 lb

## 2013-05-30 DIAGNOSIS — F331 Major depressive disorder, recurrent, moderate: Secondary | ICD-10-CM

## 2013-05-30 DIAGNOSIS — F410 Panic disorder [episodic paroxysmal anxiety] without agoraphobia: Secondary | ICD-10-CM

## 2013-05-30 MED ORDER — ALPRAZOLAM 0.5 MG PO TABS: 0.5000 mg | ORAL_TABLET | Freq: Every day | ORAL | Status: DC | PRN

## 2013-05-30 MED ORDER — CITALOPRAM HYDROBROMIDE 40 MG PO TABS: 40.0000 mg | ORAL_TABLET | Freq: Every day | ORAL | Status: DC

## 2013-06-05 ENCOUNTER — Encounter (HOSPITAL_COMMUNITY): Payer: Self-pay | Admitting: Physician Assistant
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
## 2013-06-19 ENCOUNTER — Other Ambulatory Visit: Payer: Self-pay | Admitting: Internal Medicine

## 2013-06-19 DIAGNOSIS — G4733 Obstructive sleep apnea (adult) (pediatric): Secondary | ICD-10-CM

## 2013-06-27 ENCOUNTER — Encounter: Payer: Self-pay | Admitting: Internal Medicine

## 2013-06-27 ENCOUNTER — Ambulatory Visit (INDEPENDENT_AMBULATORY_CARE_PROVIDER_SITE_OTHER): Payer: Medicare Other | Admitting: Internal Medicine

## 2013-06-27 DIAGNOSIS — G4733 Obstructive sleep apnea (adult) (pediatric): Secondary | ICD-10-CM

## 2013-06-27 DIAGNOSIS — J45909 Unspecified asthma, uncomplicated: Secondary | ICD-10-CM

## 2013-06-27 MED ORDER — PHENTERMINE HCL 30 MG PO CAPS: 30.0000 mg | ORAL_CAPSULE | ORAL | Status: DC

## 2013-06-27 NOTE — Patient Instructions (Addendum)
1. Will give you one month supply of Phentermine 30 mg po daily # 30 tabs. 2. Adverse effects discussed with patient including cardiac ischemia or MI, tachycardia, HTN, Pulmonary HTN, Psychosis, abuse dependency, palpitation, restlessness, dizziness, insomnia, tremors, headache, diarrhea, constipation, urticara, impotence and libido change.  3. Please see me in one month before your medication runs out. If you lose >=5 lbs in one month, I will refill another month. 4. Follow up in one month.

## 2013-06-27 NOTE — Assessment & Plan Note (Signed)
Patient states that she is doing well. No cough, SOB or sputum. Able to preform ADLs well.     Patient moved from [place] and joined our clinic after her hospitalization for AECOPD in 2013. She reported that she had a history of COPD and never had PFT done in the past. We were able to obtain her PFT in Feb. 2014 and referred her [facility]. She was evaluated by [facility] Dr. Dr. Sherene Sires who states that she has chronic asthma, not COPD. She is on Albuterol and Dulera inhalers now.   - continue current regimen.

## 2013-06-27 NOTE — Assessment & Plan Note (Addendum)
Patient is concerned about her obesity, and would like to try Phentermine. She was given 6 pills by her Cousin, and states that she tolerates it well. She is eager to try this medication. Of note, she was sent to [facility] weight loss program and quit it because " I do not have $ 500 to pay upfront".  Plan - Diet, lifestyle and goals for weight loss   I have discussed with her about the above changes in the past and referred her to [facility] weight loss program. Patient reports that she has ben given the extensive education on diet, exercise and goals of weight loss. However, she reports that she had to quit it due to the cost that she could not afford.  - Pharmacologic therapy    Patient's BMI is ~ 46 and has failed to achieve weight loss goals through the diet and exercise ( she can hardly exercise due to her limited endurance from her pulmonary issues.      Phentermine is the "most often prescribed drug for weight loss in the [place] states. There is only one 36 week trial done in the past.      Phentermine is only approved for "short term use, which is widely interpreted as up to 12 weeks", has more side effects and the potential for abuse.    I would ONLY prescribe Phentermine 30 mg po daily one month supply with patient's agreement to lose 5 lbs weight in one month to be able to get another month refill. Otherwise, I will slowing weaning her off the medication.    The adverse effects were discussed with patient including cardiac ischemia or MI, tachycardia, HTN, Pulmonary HTN, Psychosis, abuse dependency, palpitation, restlessness, dizziness, insomnia, tremors, headache, diarrhea, constipation, urticara, impotence and libido change.   - I have discussed with the attending physician Dr. Aundria Rud in details about this medication.
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
## 2013-06-27 NOTE — Assessment & Plan Note (Signed)
Her OSA was just diagnosed and she is waiting for her CPAP by [facility].

## 2013-07-02 NOTE — Progress Notes (Signed)
Case discussed with Dr. Li soon after the resident saw the patient. We reviewed the resident's history and exam and pertinent patient test results. I agree with the assessment, diagnosis, and plan of care documented in the resident's note. 

## 2013-07-10 ENCOUNTER — Encounter: Payer: Self-pay | Admitting: Internal Medicine

## 2013-07-25 ENCOUNTER — Encounter: Payer: Self-pay | Admitting: Internal Medicine

## 2013-07-25 ENCOUNTER — Ambulatory Visit (INDEPENDENT_AMBULATORY_CARE_PROVIDER_SITE_OTHER): Payer: Medicare Other | Admitting: Internal Medicine

## 2013-07-25 DIAGNOSIS — Z23 Encounter for immunization: Secondary | ICD-10-CM

## 2013-07-25 MED ORDER — PHENTERMINE HCL 30 MG PO CAPS: 30.0000 mg | ORAL_CAPSULE | ORAL | Status: DC

## 2013-07-25 NOTE — Patient Instructions (Addendum)
1. Will follow up in one month 2. The adverse effects were discussed with patient including cardiac ischemia or MI, tachycardia, HTN, Pulmonary HTN, Psychosis, abuse dependency, palpitation, restlessness, dizziness, insomnia, tremors, headache, diarrhea, constipation, urticara, impotence and libido change   Phentermine tablets or capsules   What is this medicine? PHENTERMINE (FEN ter meen) decreases your appetite. It is used with a reduced calorie diet and exercise to help you lose weight. This medicine may be used for other purposes; ask your health care provider or pharmacist if you have questions. What should I tell my health care provider before I take this medicine? They need to know if you have any of these conditions: -agitation -glaucoma -heart disease -high blood pressure -history of substance abuse -lung disease called Primary Pulmonary Hypertension (PPH) -taken an MAOI like Carbex, Eldepryl, Marplan, Nardil, or Parnate in last 14 days -thyroid disease -an unusual or allergic reaction to phentermine, other medicines, foods, dyes, or preservatives -pregnant or trying to get pregnant -breast-feeding How should I use this medicine? Take this medicine by mouth with a glass of water. Follow the directions on the prescription label. This medicine is usually taken 30 minutes before or 1 to 2 hours after breakfast. Avoid taking this medicine in the evening. It may interfere with sleep. Take your doses at regular intervals. Do not take your medicine more often than directed. Talk to your pediatrician regarding the use of this medicine in children. Special care may be needed. Overdosage: If you think you have taken too much of this medicine contact a poison control center or emergency room at once. NOTE: This medicine is only for you. Do not share this medicine with others. What if I miss a dose? If you miss a dose, take it as soon as you can. If it is almost time for your next dose,  take only that dose. Do not take double or extra doses. What may interact with this medicine? Do not take this medicine with any of the following medications: -duloxetine -MAOIs like Carbex, Eldepryl, Marplan, Nardil, and Parnate -medicines for colds or breathing difficulties like pseudoephedrine or phenylephrine -procarbazine -sibutramine -SSRIs like citalopram, escitalopram, fluoxetine, fluvoxamine, paroxetine, and sertraline -stimulants like dexmethylphenidate, methylphenidate or modafinil -venlafaxine This medicine may also interact with the following medications: -medicines for diabetes This list may not describe all possible interactions. Give your health care provider a list of all the medicines, herbs, non-prescription drugs, or dietary supplements you use. Also tell them if you smoke, drink alcohol, or use illegal drugs. Some items may interact with your medicine. What should I watch for while using this medicine? Notify your physician immediately if you become short of breath while doing your normal activities. Do not take this medicine within 6 hours of bedtime. It can keep you from getting to sleep. Avoid drinks that contain caffeine and try to stick to a regular bedtime every night. This medicine was intended to be used in addition to a healthy diet and exercise. The best results are achieved this way. This medicine is only indicated for short-term use. Eventually your weight loss may level out. At that point, the drug will only help you maintain your new weight. Do not increase or in any way change your dose without consulting your doctor. You may get drowsy or dizzy. Do not drive, use machinery, or do anything that needs mental alertness until you know how this medicine affects you. Do not stand or sit up quickly, especially if you are an older  patient. This reduces the risk of dizzy or fainting spells. Alcohol may increase dizziness and drowsiness. Avoid alcoholic drinks. What side  effects may I notice from receiving this medicine? Side effects that you should report to your doctor or health care professional as soon as possible: -chest pain, palpitations -depression or severe changes in mood -increased blood pressure -irritability -nervousness or restlessness -severe dizziness -shortness of breath -problems urinating -unusual swelling of the legs -vomiting Side effects that usually do not require medical attention (report to your doctor or health care professional if they continue or are bothersome): -blurred vision or other eye problems -changes in sexual ability or desire -constipation or diarrhea -difficulty sleeping -dry mouth or unpleasant taste -headache -nausea This list may not describe all possible side effects. Call your doctor for medical advice about side effects. You may report side effects to FDA at 1-800-FDA-1088. Where should I keep my medicine? Keep out of the reach of children. This medicine can be abused. Keep your medicine in a safe place to protect it from theft. Do not share this medicine with anyone. Selling or giving away this medicine is dangerous and against the law. Store at room temperature between 20 and 25 degrees C (68 and 77 degrees F). Keep container tightly closed. Throw away any unused medicine after the expiration date. NOTE: This sheet is a summary. It may not cover all possible information. If you have questions about this medicine, talk to your doctor, pharmacist, or health care provider.  2012, [employer]vier/[employer] Standard. (12/08/2010 11:02:44 AM)
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
## 2013-07-26 NOTE — Assessment & Plan Note (Addendum)
She lost 16 lbs of weight since her last OV on 06/27/13.   - Will give her one month supply of Phentermine ( 2nd month) with patient's agreement to lose 5 lbs in one month to be able to get her 3rd month supply - Phentermine can be given for a short term use up to 12 weeks. We will need to slowly wean her off the medication after the treatment.  -  The adverse effects were discussed with patient including cardiac ischemia or MI, tachycardia, HTN, Pulmonary HTN, Psychosis, abuse dependency, palpitation, restlessness, dizziness, insomnia, tremors, headache, diarrhea, constipation, urticara, impotence and libido change. - patient understands all side effects and would like to continue the treatment. - follow up in one month.

## 2013-07-26 NOTE — Progress Notes (Signed)
Case discussed with Dr. Li at the time of the visit.  We reviewed the resident's history and exam and pertinent patient test results.  I agree with the assessment, diagnosis, and plan of care documented in the resident's note. 

## 2013-08-26 ENCOUNTER — Ambulatory Visit (INDEPENDENT_AMBULATORY_CARE_PROVIDER_SITE_OTHER): Payer: Medicare Other | Admitting: Physician Assistant

## 2013-08-26 VITALS — BP 128/78 | HR 86 | Ht 67.0 in | Wt 291.0 lb

## 2013-08-26 DIAGNOSIS — F331 Major depressive disorder, recurrent, moderate: Secondary | ICD-10-CM

## 2013-08-26 DIAGNOSIS — F410 Panic disorder [episodic paroxysmal anxiety] without agoraphobia: Secondary | ICD-10-CM

## 2013-08-26 DIAGNOSIS — F330 Major depressive disorder, recurrent, mild: Secondary | ICD-10-CM

## 2013-08-26 MED ORDER — CITALOPRAM HYDROBROMIDE 40 MG PO TABS: 40.0000 mg | ORAL_TABLET | Freq: Every day | ORAL | Status: DC

## 2013-08-26 MED ORDER — TRAZODONE HCL 150 MG PO TABS: 150.0000 mg | ORAL_TABLET | Freq: Every day | ORAL | Status: DC

## 2013-08-29 ENCOUNTER — Encounter (HOSPITAL_COMMUNITY): Payer: Self-pay | Admitting: Physician Assistant

## 2013-08-29 NOTE — Progress Notes (Signed)
   [facility] Follow-up Outpatient Visit  [name] [name] 21-Mar-1956  Date: 08/26/2013   Subjective: [name] [name] presents today to followup on her treatment for anxiety and depression. She reports that she is doing well. She endorses experiencing a panic attack about twice per month. She reports that she stays away from the events that triggered her panic attacks. She denies any symptoms of depression. She denies any suicidal or homicidal thoughts. She denies any auditory or visual hallucinations.  Filed Vitals:   08/26/13 1352  BP: 128/78  Pulse: 86    Mental Status Examination  Appearance: Casual Alert: Yes Attention: good  Cooperative: Yes Eye Contact: Good Speech: Clear and coherent  Psychomotor Activity: Normal Memory/Concentration: Intact Oriented: person, place, time/date and situation Mood: Euthymic Affect: Appropriate Thought Processes and Associations: Linear Fund of Knowledge: Good Thought Content: Normal Insight: Good Judgement: Good  Diagnosis: Panic disorder without agoraphobia; major depressive disorder, recurrent, mild  Treatment Plan: We will continue her Celexa 40 mg at bedtime, trazodone 150 mg at bedtime, and Xanax 0.5 mg daily as needed for panic attacks. She will return for a followup in 3 months.  Vilas Edgerly, PA-C

## 2013-09-05 ENCOUNTER — Encounter: Payer: Self-pay | Admitting: Internal Medicine

## 2013-09-05 ENCOUNTER — Ambulatory Visit (INDEPENDENT_AMBULATORY_CARE_PROVIDER_SITE_OTHER): Payer: Medicare Other | Admitting: Internal Medicine

## 2013-09-05 MED ORDER — PHENTERMINE HCL 30 MG PO CAPS: 30.0000 mg | ORAL_CAPSULE | ORAL | Status: DC

## 2013-09-05 NOTE — Patient Instructions (Signed)
1. Will follow up in Dec 1st. 2. This is last month for refill. Will need to slowly wean you off this medication.  2. The adverse effects were discussed with patient including cardiac ischemia or MI, tachycardia, HTN, Pulmonary HTN, Psychosis, abuse dependency, palpitation, restlessness, dizziness, insomnia, tremors, headache, diarrhea, constipation, urticara, impotence and libido change    Phentermine tablets or capsules  What is this medicine?  PHENTERMINE (FEN ter meen) decreases your appetite. It is used with a reduced calorie diet and exercise to help you lose weight.  This medicine may be used for other purposes; ask your health care provider or pharmacist if you have questions.  What should I tell my health care provider before I take this medicine?  They need to know if you have any of these conditions:  -agitation  -glaucoma  -heart disease  -high blood pressure  -history of substance abuse  -lung disease called Primary Pulmonary Hypertension (PPH)  -taken an MAOI like Carbex, Eldepryl, Marplan, Nardil, or Parnate in last 14 days  -thyroid disease  -an unusual or allergic reaction to phentermine, other medicines, foods, dyes, or preservatives  -pregnant or trying to get pregnant  -breast-feeding  How should I use this medicine?  Take this medicine by mouth with a glass of water. Follow the directions on the prescription label. This medicine is usually taken 30 minutes before or 1 to 2 hours after breakfast. Avoid taking this medicine in the evening. It may interfere with sleep. Take your doses at regular intervals. Do not take your medicine more often than directed.  Talk to your pediatrician regarding the use of this medicine in children. Special care may be needed.  Overdosage: If you think you have taken too much of this medicine contact a poison control center or emergency room at once.  NOTE: This medicine is only for you. Do not share this medicine with others.  What if I  miss a dose?  If you miss a dose, take it as soon as you can. If it is almost time for your next dose, take only that dose. Do not take double or extra doses.  What may interact with this medicine?  Do not take this medicine with any of the following medications:  -duloxetine  -MAOIs like Carbex, Eldepryl, Marplan, Nardil, and Parnate  -medicines for colds or breathing difficulties like pseudoephedrine or phenylephrine  -procarbazine  -sibutramine  -SSRIs like citalopram, escitalopram, fluoxetine, fluvoxamine, paroxetine, and sertraline  -stimulants like dexmethylphenidate, methylphenidate or modafinil  -venlafaxine  This medicine may also interact with the following medications:  -medicines for diabetes  This list may not describe all possible interactions. Give your health care provider a list of all the medicines, herbs, non-prescription drugs, or dietary supplements you use. Also tell them if you smoke, drink alcohol, or use illegal drugs. Some items may interact with your medicine.  What should I watch for while using this medicine?  Notify your physician immediately if you become short of breath while doing your normal activities.  Do not take this medicine within 6 hours of bedtime. It can keep you from getting to sleep. Avoid drinks that contain caffeine and try to stick to a regular bedtime every night.  This medicine was intended to be used in addition to a healthy diet and exercise. The best results are achieved this way. This medicine is only indicated for short-term use. Eventually your weight loss may level out. At that point, the drug will only help you maintain  your new weight. Do not increase or in any way change your dose without consulting your doctor.  You may get drowsy or dizzy. Do not drive, use machinery, or do anything that needs mental alertness until you know how this medicine affects you. Do not stand or sit up quickly, especially if you are an older patient. This  reduces the risk of dizzy or fainting spells. Alcohol may increase dizziness and drowsiness. Avoid alcoholic drinks.  What side effects may I notice from receiving this medicine?  Side effects that you should report to your doctor or health care professional as soon as possible:  -chest pain, palpitations  -depression or severe changes in mood  -increased blood pressure  -irritability  -nervousness or restlessness  -severe dizziness  -shortness of breath  -problems urinating  -unusual swelling of the legs  -vomiting  Side effects that usually do not require medical attention (report to your doctor or health care professional if they continue or are bothersome):  -blurred vision or other eye problems  -changes in sexual ability or desire  -constipation or diarrhea  -difficulty sleeping  -dry mouth or unpleasant taste  -headache  -nausea  This list may not describe all possible side effects. Call your doctor for medical advice about side effects. You may report side effects to FDA at 1-800-FDA-1088.  Where should I keep my medicine?  Keep out of the reach of children. This medicine can be abused. Keep your medicine in a safe place to protect it from theft. Do not share this medicine with anyone. Selling or giving away this medicine is dangerous and against the law.  Store at room temperature between 20 and 25 degrees C (68 and 77 degrees F). Keep container tightly closed. Throw away any unused medicine after the expiration date.  NOTE: This sheet is a summary. It may not cover all possible information. If you have questions about this medicine, talk to your doctor, pharmacist, or health care provider.   2012, Elsevier/Gold Standard. (12/08/2010 11:02:44 AM)

## 2013-09-06 NOTE — Progress Notes (Signed)
Case discussed with Dr. Li soon after the resident saw the patient. We reviewed the resident's history and exam and pertinent patient test results. I agree with the assessment, diagnosis, and plan of care documented in the resident's note. 

## 2013-09-06 NOTE — Assessment & Plan Note (Addendum)
She lost another 16 lbs of weight since her last OV on 07/25/13. She continues to exercise and eat low fat and Carb diet. She is very proud of her weight loss, and denied any side effects.   - will give her one month supply of Phentermine ( 3rd month) with patient's agreement to lose at least 5 lbs in one month.  - Will ask her to come back on 10/07/13 for follow up. Will need to slowly wean her off the medication after the treatment ( I will discuss with the hospital pharmacist). I specifically discussed the importance for her to be back as instructed for slow wean and she understands and agrees with the plan. - The adverse and side effects are discussed again including cardiac ischemia or MI, tachycardia, HTN, Pulmonary HTN, Psychosis, abuse dependency, palpitation, restlessness, dizziness, insomnia, tremors, headaches, diarrhea, constipation, urticaria, impotence and libido changes.  - written instructions on side/adverse effects given to patient.  - she understands above discussion.
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
## 2013-10-07 ENCOUNTER — Encounter: Payer: Self-pay | Admitting: Internal Medicine

## 2013-10-07 ENCOUNTER — Ambulatory Visit (INDEPENDENT_AMBULATORY_CARE_PROVIDER_SITE_OTHER): Payer: Medicare Other | Admitting: Internal Medicine

## 2013-10-07 VITALS — BP 131/75 | HR 93 | Temp 97.2°F | Ht 67.0 in | Wt 283.2 lb

## 2013-10-07 DIAGNOSIS — Z09 Encounter for follow-up examination after completed treatment for conditions other than malignant neoplasm: Secondary | ICD-10-CM

## 2013-10-07 DIAGNOSIS — K219 Gastro-esophageal reflux disease without esophagitis: Secondary | ICD-10-CM

## 2013-10-07 MED ORDER — PHENTERMINE HCL 15 MG PO CAPS: 15.0000 mg | ORAL_CAPSULE | ORAL | Status: DC

## 2013-10-07 NOTE — Patient Instructions (Signed)
1. Will give you Phentermine 15 mg po daily x 7 days. Then stop it. 2. Follow up in 3 months.   Calorie Counting Diet A calorie counting diet requires you to eat the number of calories that are right for you in a day. Calories are the measurement of how much energy you get from the food you eat. Eating the right amount of calories is important for staying at a healthy weight. If you eat too many calories, your body will store them as fat and you may gain weight. If you eat too few calories, you may lose weight. Counting the number of calories you eat during a day will help you know if you are eating the right amount. A Registered Dietitian can determine how many calories you need in a day. The amount of calories needed varies from person to person. If your goal is to lose weight, you will need to eat fewer calories. Losing weight can benefit you if you are overweight or have health problems such as heart disease, high blood pressure, or diabetes. If your goal is to gain weight, you will need to eat more calories. Gaining weight may be necessary if you have a certain health problem that causes your body to need more energy. TIPS Whether you are increasing or decreasing the number of calories you eat during a day, it may be hard to get used to changes in what you eat and drink. The following are tips to help you keep track of the number of calories you eat.  Measure foods at home with measuring cups. This helps you know the amount of food and number of calories you are eating.  Restaurants often serve food in amounts that are larger than 1 serving. While eating out, estimate how many servings of a food you are given. For example, a serving of cooked rice is  cup or about the size of half of a fist. Knowing serving sizes will help you be aware of how much food you are eating at restaurants.  Ask for smaller portion sizes or child-size portions at restaurants.  Plan to eat half of a meal at a restaurant.  Take the rest home or share the other half with a friend.  Read the Nutrition Facts panel on food labels for calorie content and serving size. You can find out how many servings are in a package, the size of a serving, and the number of calories each serving has.  For example, a package might contain 3 cookies. The Nutrition Facts panel on that package says that 1 serving is 1 cookie. Below that, it will say there are 3 servings in the container. The calories section of the Nutrition Facts label says there are 90 calories. This means there are 90 calories in 1 cookie (1 serving). If you eat 1 cookie you have eaten 90 calories. If you eat all 3 cookies, you have eaten 270 calories (3 servings x 90 calories = 270 calories). The list below tells you how big or small some common portion sizes are.  1 oz.........4 stacked dice.  3 oz........Marland KitchenDeck of cards.  1 tsp.......Marland KitchenTip of little finger.  1 tbs......Marland KitchenMarland KitchenThumb.  2 tbs.......Marland KitchenGolf ball.   cup......Marland KitchenHalf of a fist.  1 cup.......Marland KitchenA fist. KEEP A FOOD LOG Write down every food item you eat, the amount you eat, and the number of calories in each food you eat during the day. At the end of the day, you can add up the total number of  calories you have eaten. It may help to keep a list like the one below. Find out the calorie information by reading the Nutrition Facts panel on food labels. Breakfast  Bran cereal (1 cup, 110 calories).  Fat-free milk ( cup, 45 calories). Snack  Apple (1 medium, 80 calories). Lunch  Spinach (1 cup, 20 calories).  Tomato ( medium, 20 calories).  Chicken breast strips (3 oz, 165 calories).  Shredded cheddar cheese ( cup, 110 calories).  Light [place] dressing (2 tbs, 60 calories).  Whole-wheat bread (1 slice, 80 calories).  Tub margarine (1 tsp, 35 calories).  Vegetable soup (1 cup, 160 calories). Dinner  Pork chop (3 oz, 190 calories).  Brown rice (1 cup, 215 calories).  Steamed broccoli (  cup, 20 calories).  Strawberries (1  cup, 65 calories).  Whipped cream (1 tbs, 50 calories). Daily Calorie Total: 1425 Document Released: 10/24/2005 Document Revised: 01/16/2012 Document Reviewed: 04/20/2007 [facility] Patient Information 2014 [place], [place].  Exercise to Lose Weight Exercise and a healthy diet may help you lose weight. Your doctor may suggest specific exercises. EXERCISE IDEAS AND TIPS  Choose low-cost things you enjoy doing, such as walking, bicycling, or exercising to workout videos.  Take stairs instead of the elevator.  Walk during your lunch break.  Park your car further away from work or school.  Go to a gym or an exercise class.  Start with 5 to 10 minutes of exercise each day. Build up to 30 minutes of exercise 4 to 6 days a week.  Wear shoes with good support and comfortable clothes.  Stretch before and after working out.  Work out until you breathe harder and your heart beats faster.  Drink extra water when you exercise.  Do not do so much that you hurt yourself, feel dizzy, or get very short of breath. Exercises that burn about 150 calories:  Running 1  miles in 15 minutes.  Playing volleyball for 45 to 60 minutes.  Washing and waxing a car for 45 to 60 minutes.  Playing touch football for 45 minutes.  Walking 1  miles in 35 minutes.  Pushing a stroller 1  miles in 30 minutes.  Playing basketball for 30 minutes.  Raking leaves for 30 minutes.  Bicycling 5 miles in 30 minutes.  Walking 2 miles in 30 minutes.  Dancing for 30 minutes.  Shoveling snow for 15 minutes.  Swimming laps for 20 minutes.  Walking up stairs for 15 minutes.  Bicycling 4 miles in 15 minutes.  Gardening for 30 to 45 minutes.  Jumping rope for 15 minutes.  Washing windows or floors for 45 to 60 minutes. Document Released: 11/26/2010 Document Revised: 01/16/2012 Document Reviewed: 11/26/2010 [facility] Patient Information 2014 [place], [place].
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
## 2013-10-07 NOTE — Assessment & Plan Note (Addendum)
Assessment:  She has had a total of 40 lbs weight loss while taking 12-week phentermine.  She is here for drug tapering off.  Plan: - No specific guidelines or indication with regards to phentermine tapering. - expert opinion recommend that it is also safe not to taper. The SE is increased appetite.  - Will give her phentermine 15 mg daily x 7 days then stop the medication. - Patient states that she would like to go to a local [facility] bariatric clinic. Will provide the office note once she has a confirmed appt.

## 2013-10-09 NOTE — Progress Notes (Signed)
Case discussed with Dr. Li soon after the resident saw the patient. We reviewed the resident's history and exam and pertinent patient test results. I agree with the assessment, diagnosis, and plan of care documented in the resident's note. 

## 2013-10-09 NOTE — Addendum Note (Signed)
Addended by: Debe Coder B on: 10/09/2013 09:49 AM   Modules accepted: Level of Service

## 2013-11-11 ENCOUNTER — Encounter: Payer: Self-pay | Admitting: Internal Medicine

## 2013-11-26 ENCOUNTER — Ambulatory Visit (HOSPITAL_COMMUNITY): Payer: Self-pay | Admitting: Psychiatry

## 2013-12-05 ENCOUNTER — Ambulatory Visit (INDEPENDENT_AMBULATORY_CARE_PROVIDER_SITE_OTHER): Payer: Medicare Other | Admitting: Psychiatry

## 2013-12-05 ENCOUNTER — Encounter (HOSPITAL_COMMUNITY): Payer: Self-pay | Admitting: Psychiatry

## 2013-12-05 VITALS — BP 137/76 | HR 88 | Ht 67.0 in | Wt 290.2 lb

## 2013-12-05 DIAGNOSIS — F331 Major depressive disorder, recurrent, moderate: Secondary | ICD-10-CM

## 2013-12-05 DIAGNOSIS — F329 Major depressive disorder, single episode, unspecified: Secondary | ICD-10-CM

## 2013-12-05 DIAGNOSIS — F410 Panic disorder [episodic paroxysmal anxiety] without agoraphobia: Secondary | ICD-10-CM

## 2013-12-05 MED ORDER — TRAZODONE HCL 150 MG PO TABS: 150.0000 mg | ORAL_TABLET | Freq: Every day | ORAL | Status: DC

## 2013-12-05 MED ORDER — CITALOPRAM HYDROBROMIDE 40 MG PO TABS: 40.0000 mg | ORAL_TABLET | Freq: Every day | ORAL | Status: DC

## 2013-12-05 MED ORDER — ALPRAZOLAM 0.5 MG PO TABS: ORAL_TABLET | ORAL | Status: DC
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
## 2014-01-09 ENCOUNTER — Encounter: Payer: Self-pay | Admitting: Internal Medicine

## 2014-03-05 ENCOUNTER — Encounter (HOSPITAL_COMMUNITY): Payer: Self-pay | Admitting: Psychiatry

## 2014-03-05 ENCOUNTER — Ambulatory Visit (INDEPENDENT_AMBULATORY_CARE_PROVIDER_SITE_OTHER): Payer: Medicare Other | Admitting: Psychiatry

## 2014-03-05 VITALS — BP 143/86 | HR 95 | Ht 67.0 in | Wt 299.0 lb

## 2014-03-05 DIAGNOSIS — F410 Panic disorder [episodic paroxysmal anxiety] without agoraphobia: Secondary | ICD-10-CM

## 2014-03-05 DIAGNOSIS — F329 Major depressive disorder, single episode, unspecified: Secondary | ICD-10-CM

## 2014-03-05 MED ORDER — TRAZODONE HCL 150 MG PO TABS: 150.0000 mg | ORAL_TABLET | Freq: Every day | ORAL | Status: DC

## 2014-03-05 MED ORDER — ALPRAZOLAM 0.5 MG PO TABS: ORAL_TABLET | ORAL | Status: DC

## 2014-03-05 MED ORDER — CITALOPRAM HYDROBROMIDE 40 MG PO TABS: 40.0000 mg | ORAL_TABLET | Freq: Every day | ORAL | Status: DC
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
## 2014-04-30 ENCOUNTER — Observation Stay (HOSPITAL_COMMUNITY)
Admission: EM | Admit: 2014-04-30 | Discharge: 2014-05-02 | Disposition: A | Discharge status: Home / Self Care | Payer: Medicare Other | Attending: Internal Medicine | Admitting: Internal Medicine

## 2014-04-30 ENCOUNTER — Encounter (HOSPITAL_COMMUNITY): Payer: Self-pay | Admitting: Emergency Medicine

## 2014-04-30 ENCOUNTER — Emergency Department (HOSPITAL_COMMUNITY): Payer: Medicare Other

## 2014-04-30 DIAGNOSIS — Z6841 Body Mass Index (BMI) 40.0 and over, adult: Secondary | ICD-10-CM | POA: Insufficient documentation

## 2014-04-30 DIAGNOSIS — D72829 Elevated white blood cell count, unspecified: Secondary | ICD-10-CM | POA: Diagnosis not present

## 2014-04-30 DIAGNOSIS — E669 Obesity, unspecified: Secondary | ICD-10-CM | POA: Insufficient documentation

## 2014-04-30 DIAGNOSIS — G4700 Insomnia, unspecified: Secondary | ICD-10-CM | POA: Insufficient documentation

## 2014-04-30 DIAGNOSIS — Z87891 Personal history of nicotine dependence: Secondary | ICD-10-CM | POA: Insufficient documentation

## 2014-04-30 DIAGNOSIS — F419 Anxiety disorder, unspecified: Secondary | ICD-10-CM

## 2014-04-30 DIAGNOSIS — F411 Generalized anxiety disorder: Secondary | ICD-10-CM | POA: Insufficient documentation

## 2014-04-30 DIAGNOSIS — F3289 Other specified depressive episodes: Secondary | ICD-10-CM | POA: Insufficient documentation

## 2014-04-30 DIAGNOSIS — F329 Major depressive disorder, single episode, unspecified: Secondary | ICD-10-CM | POA: Diagnosis not present

## 2014-04-30 DIAGNOSIS — J441 Chronic obstructive pulmonary disease with (acute) exacerbation: Secondary | ICD-10-CM

## 2014-04-30 DIAGNOSIS — G4733 Obstructive sleep apnea (adult) (pediatric): Secondary | ICD-10-CM

## 2014-04-30 HISTORY — DX: Anxiety disorder, unspecified: F41.9

## 2014-04-30 HISTORY — DX: Sleep apnea, unspecified: G47.30

## 2014-04-30 HISTORY — DX: Unspecified asthma, uncomplicated: J45.909

## 2014-04-30 HISTORY — DX: Dependence on supplemental oxygen: Z99.81

## 2014-04-30 LAB — CBC WITH DIFFERENTIAL/PLATELET
Basophils Absolute: 0 10*3/uL (ref 0.0–0.1)
Basophils Relative: 0 % (ref 0–1)
Eosinophils Absolute: 0.1 10*3/uL (ref 0.0–0.7)
Eosinophils Relative: 1 % (ref 0–5)
HEMATOCRIT: 44 % (ref 36.0–46.0)
Hemoglobin: 15 g/dL (ref 12.0–15.0)
LYMPHS ABS: 1.9 10*3/uL (ref 0.7–4.0)
LYMPHS PCT: 17 % (ref 12–46)
MCH: 29.1 pg (ref 26.0–34.0)
MCHC: 34.1 g/dL (ref 30.0–36.0)
MCV: 85.4 fL (ref 78.0–100.0)
MONO ABS: 0.7 10*3/uL (ref 0.1–1.0)
Monocytes Relative: 6 % (ref 3–12)
Neutro Abs: 8.7 10*3/uL — ABNORMAL HIGH (ref 1.7–7.7)
Neutrophils Relative %: 76 % (ref 43–77)
PLATELETS: 249 10*3/uL (ref 150–400)
RBC: 5.15 MIL/uL — AB (ref 3.87–5.11)
RDW: 15.3 % (ref 11.5–15.5)
WBC: 11.5 10*3/uL — AB (ref 4.0–10.5)

## 2014-04-30 LAB — BASIC METABOLIC PANEL
BUN: 5 mg/dL — ABNORMAL LOW (ref 6–23)
CALCIUM: 9.6 mg/dL (ref 8.4–10.5)
CHLORIDE: 99 meq/L (ref 96–112)
CO2: 24 meq/L (ref 19–32)
Creatinine, Ser: 0.55 mg/dL (ref 0.50–1.10)
GFR calc Af Amer: >90 mL/min (ref >90)
GFR calc non Af Amer: >90 mL/min (ref >90)
Glucose, Bld: 108 mg/dL — ABNORMAL HIGH (ref 70–99)
Potassium: 4.1 mEq/L (ref 3.7–5.3)
SODIUM: 140 meq/L (ref 137–147)

## 2014-04-30 LAB — PRO B NATRIURETIC PEPTIDE: Pro B Natriuretic peptide (BNP): 77.7 pg/mL (ref 0–125)

## 2014-04-30 MED ORDER — IPRATROPIUM-ALBUTEROL 0.5-2.5 (3) MG/3ML IN SOLN
3.0000 mL | Freq: Once | RESPIRATORY_TRACT | Status: AC
  Administered 2014-04-30: 3 mL via RESPIRATORY_TRACT
  Filled 2014-04-30: qty 3

## 2014-04-30 MED ORDER — ENOXAPARIN SODIUM 40 MG/0.4ML ~~LOC~~ SOLN
40.0000 mg | SUBCUTANEOUS | Status: DC
  Administered 2014-04-30 – 2014-05-01 (×2): 40 mg via SUBCUTANEOUS
  Filled 2014-04-30 (×4): qty 0.4

## 2014-04-30 MED ORDER — LEVOFLOXACIN 750 MG PO TABS
750.0000 mg | ORAL_TABLET | Freq: Every day | ORAL | Status: DC
  Administered 2014-05-01 – 2014-05-02 (×2): 750 mg via ORAL
  Filled 2014-04-30 (×2): qty 1

## 2014-04-30 MED ORDER — TRAZODONE HCL 150 MG PO TABS
150.0000 mg | ORAL_TABLET | Freq: Every day | ORAL | Status: DC
  Administered 2014-04-30 – 2014-05-01 (×2): 150 mg via ORAL
  Filled 2014-04-30 (×3): qty 1
  Filled 2014-04-30: qty 3

## 2014-04-30 MED ORDER — MOMETASONE FURO-FORMOTEROL FUM 100-5 MCG/ACT IN AERO
2.0000 | INHALATION_SPRAY | Freq: Two times a day (BID) | RESPIRATORY_TRACT | Status: DC
  Administered 2014-05-01 – 2014-05-02 (×2): 2 via RESPIRATORY_TRACT
  Filled 2014-04-30 (×2): qty 8.8

## 2014-04-30 MED ORDER — METHYLPREDNISOLONE SODIUM SUCC 125 MG IJ SOLR
125.0000 mg | Freq: Once | INTRAMUSCULAR | Status: AC
  Administered 2014-04-30: 125 mg via INTRAVENOUS
  Filled 2014-04-30: qty 2

## 2014-04-30 MED ORDER — IPRATROPIUM-ALBUTEROL 0.5-2.5 (3) MG/3ML IN SOLN
3.0000 mL | RESPIRATORY_TRACT | Status: DC
  Administered 2014-04-30: 3 mL via RESPIRATORY_TRACT
  Filled 2014-04-30: qty 3

## 2014-04-30 MED ORDER — IPRATROPIUM-ALBUTEROL 0.5-2.5 (3) MG/3ML IN SOLN
3.0000 mL | Freq: Four times a day (QID) | RESPIRATORY_TRACT | Status: DC
  Administered 2014-05-01 – 2014-05-02 (×4): 3 mL via RESPIRATORY_TRACT
  Filled 2014-04-30 (×4): qty 3

## 2014-04-30 MED ORDER — CITALOPRAM HYDROBROMIDE 40 MG PO TABS
40.0000 mg | ORAL_TABLET | Freq: Every day | ORAL | Status: DC
  Administered 2014-04-30 – 2014-05-01 (×2): 40 mg via ORAL
  Filled 2014-04-30 (×3): qty 1
  Filled 2014-04-30: qty 2

## 2014-04-30 MED ORDER — LEVOFLOXACIN 750 MG PO TABS
750.0000 mg | ORAL_TABLET | Freq: Once | ORAL | Status: AC
  Administered 2014-04-30: 750 mg via ORAL
  Filled 2014-04-30: qty 1

## 2014-04-30 MED ORDER — PREDNISONE 20 MG PO TABS
40.0000 mg | ORAL_TABLET | Freq: Every day | ORAL | Status: DC
  Administered 2014-05-01 – 2014-05-02 (×2): 40 mg via ORAL
  Filled 2014-04-30 (×3): qty 2

## 2014-04-30 MED ORDER — IPRATROPIUM-ALBUTEROL 0.5-2.5 (3) MG/3ML IN SOLN: 3.0000 mL | RESPIRATORY_TRACT | Status: DC | PRN

## 2014-04-30 NOTE — ED Provider Notes (Signed)
Medical screening examination/treatment/procedure(s) were conducted as a shared visit with non-physician practitioner(s) and myself.  I personally evaluated the patient during the encounter.   EKG Interpretation   Date/Time:  Wednesday April 30 2014 12:16:16 EDT Ventricular Rate:  88 PR Interval:  159 QRS Duration: 93 QT Interval:  420 QTC Calculation: 508 R Axis:   -25 Text Interpretation:  Sinus rhythm Borderline left axis deviation Low  voltage, precordial leads Borderline repolarization abnormality Borderline  prolonged QT interval No significant change since last tracing Confirmed  by ZACKOWSKI  MD, SCOTT [phone]) on 04/30/2014 12:29:27 PM      Results for orders placed during the hospital encounter of 04/30/14  PRO B NATRIURETIC PEPTIDE      Result Value Ref Range   Pro B Natriuretic peptide (BNP) 77.7  0 - 125 pg/mL  CBC WITH DIFFERENTIAL      Result Value Ref Range   WBC 11.5 (*) 4.0 - 10.5 K/uL   RBC 5.15 (*) 3.87 - 5.11 MIL/uL   Hemoglobin 15.0  12.0 - 15.0 g/dL   HCT 44.0  36.0 - 46.0 %   MCV 85.4  78.0 - 100.0 fL   MCH 29.1  26.0 - 34.0 pg   MCHC 34.1  30.0 - 36.0 g/dL   RDW 15.3  11.5 - 15.5 %   Platelets 249  150 - 400 K/uL   Neutrophils Relative % 76  43 - 77 %   Neutro Abs 8.7 (*) 1.7 - 7.7 K/uL   Lymphocytes Relative 17  12 - 46 %   Lymphs Abs 1.9  0.7 - 4.0 K/uL   Monocytes Relative 6  3 - 12 %   Monocytes Absolute 0.7  0.1 - 1.0 K/uL   Eosinophils Relative 1  0 - 5 %   Eosinophils Absolute 0.1  0.0 - 0.7 K/uL   Basophils Relative 0  0 - 1 %   Basophils Absolute 0.0  0.0 - 0.1 K/uL  BASIC METABOLIC PANEL      Result Value Ref Range   Sodium 140  137 - 147 mEq/L   Potassium 4.1  3.7 - 5.3 mEq/L   Chloride 99  96 - 112 mEq/L   CO2 24  19 - 32 mEq/L   Glucose, Bld 108 (*) 70 - 99 mg/dL   BUN 5 (*) 6 - 23 mg/dL   Creatinine, Ser 0.55  0.50 - 1.10 mg/dL   Calcium 9.6  8.4 - 10.5 mg/dL   GFR calc non Af Amer >90  >90 mL/min   GFR calc Af Amer >90  >90  mL/min   Dg Chest 2 View  04/30/2014   CLINICAL DATA:  COPD, shortness of breath  EXAM: CHEST  2 VIEW  COMPARISON:  11/27/2012  FINDINGS: Cardiomediastinal silhouette is stable. No acute infiltrate or pleural effusion. No pulmonary edema. Stable chronic mild interstitial prominence. Mild hyperinflation. Bony thorax is unremarkable.  IMPRESSION: No active disease. Mild hyperinflation. Stable chronic mild interstitial prominence.   Electronically Signed   By: Lahoma Crocker M.D.   On: 04/30/2014 13:05    Patient clinically with an exacerbation of COPD. With bronchitis component. No evidence of pneumonia for sure. Patient treated with nebulizers steroids still wheezing will require admission. Patient nontoxic no acute distress. Lungs bilaterally with some slight wheezing. Patient came in for productive cough of green stuff and shortness of breath. Patient states she been using an inhaler but it has not been helping.  Fredia Sorrow, MD 04/30/14 1539

## 2014-04-30 NOTE — ED Notes (Signed)
Retake BP

## 2014-04-30 NOTE — ED Notes (Signed)
Per 6N secretary charge states ready for patient and no further report needed.

## 2014-04-30 NOTE — ED Notes (Signed)
Pt. Stated, I've been coughing up green stuff and being SOB , I have COPD and Ive been using my inhaler but not working

## 2014-04-30 NOTE — Progress Notes (Signed)
RT spoke to patient about technique of using the inhaler and patient was able to explain and demonstrate the technique to RT. RT will continue to monitor.
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
## 2014-04-30 NOTE — ED Provider Notes (Signed)
CSN: [id number]     Arrival date & time 04/30/14  9024 History   First MD Initiated Contact with Patient 04/30/14 1105     Chief Complaint  Patient presents with  . Cough  . Shortness of Breath   HPI  [name] [name] is a 58 y.o. female with a PMH of COPD, SOB, recurrent URI's, GERD, insomnia, abdominal spasms, depression and obestiy who presents to the ED for evaluation of cough and SOB.  History was provided by the patient. Patient states she has had progressively worsening SOB for the past week. Also wheezing, productive cough with green sputum, rhinorrhea, and nasal congestion. States it started with a sore throat but this resolved. Husband is also sick with URI-like symptoms. States her symptoms today are similar to her COPD flare-ups in the past. She also complains of right upper back pain with coughing only. No back pain currently. She has been using her inhaler with no relief in her symptoms. She also has had a subjective fever, chills, and fatigue. Mild diarrhea with no abdominal pain, nausea, or emesis. No leg swelling, chest pain, headache, dizziness, lightheadedness, or other concerns. Previous tobacco user. Patient has supplemental O2 therapy to use as needed.    Past Medical History  Diagnosis Date  . COPD (chronic obstructive pulmonary disease)     last exacerbation 1 year ago, treated as outpatient  . Shortness of breath   . Recurrent upper respiratory infection (URI)   . Pneumonia   . GERD (gastroesophageal reflux disease)   . Insomnia   . Abdominal spasms     mid  . Depression   . Obesity    Past Surgical History  Procedure Laterality Date  . Abdominal hysterectomy  Age 43  . Appendectomy  Age 75   Family History  Problem Relation Age of Onset  . Breast cancer Mother   . Breast cancer Maternal Grandmother   . Breast cancer Maternal Aunt   . Alcohol abuse Mother   . Emphysema Mother     smoked  . Asthma Mother    History  Substance Use Topics  . Smoking  status: Former Smoker -- 2.00 packs/day for 44 years    Types: Cigarettes    Quit date: 11/07/2012  . Smokeless tobacco: Never Used  . Alcohol Use: No   OB History   Grav Para Term Preterm Abortions TAB SAB Ect Mult Living                 Review of Systems  Constitutional: Positive for fever, chills and fatigue. Negative for activity change and appetite change.  HENT: Positive for congestion, rhinorrhea and sore throat (resolved). Negative for ear pain.   Respiratory: Positive for cough, shortness of breath and wheezing. Negative for chest tightness.   Cardiovascular: Negative for chest pain and leg swelling.  Gastrointestinal: Positive for diarrhea. Negative for nausea, vomiting, abdominal pain and constipation.  Genitourinary: Negative for dysuria and difficulty urinating.  Musculoskeletal: Positive for back pain. Negative for myalgias.  Skin: Negative for wound.  Neurological: Negative for dizziness, weakness, light-headedness and headaches.    Allergies  Ibuprofen  Home Medications   Prior to Admission medications   Medication Sig Start Date End Date Taking? Authorizing Provider  albuterol (ACCUNEB) 0.63 MG/3ML nebulizer solution Take 1 ampule by nebulization every 6 (six) hours as needed for wheezing.    Historical Provider, MD  ALPRAZolam Duanne Moron) 0.5 MG tablet Take 1 tab for severe anxiety attack 03/05/14   Dossie Der T  Arfeen, MD  citalopram (CELEXA) 40 MG tablet Take 1 tablet (40 mg total) by mouth at bedtime. 03/05/14   Kathlee Nations, MD  Mometasone Furo-Formoterol Fum (DULERA IN) Inhale 1 puff into the lungs daily.    Historical Provider, MD  pantoprazole (PROTONIX) 40 MG tablet  12/19/13   Historical Provider, MD  traZODone (DESYREL) 150 MG tablet Take 1 tablet (150 mg total) by mouth at bedtime. 03/05/14   Kathlee Nations, MD   BP 110/71  Pulse 86  Temp(Src) 98.6 F (37 C) (Oral)  Resp 23  Ht 5\' 7"  (1.702 m)  Wt 300 lb (136.079 kg)  BMI 46.98 kg/m2  SpO2 92%  Filed  Vitals:   04/30/14 1530 04/30/14 1628 04/30/14 2042 04/30/14 2159  BP: 153/78 114/65  123/50  Pulse: 89 92  89  Temp:  97.6 F (36.4 C)  97.7 F (36.5 C)  TempSrc:  Oral  Oral  Resp: 26 20  22   Height:  5\' 7"  (1.702 m)    Weight:  299 lb 13.2 oz (136 kg)    SpO2: 91% 90% 93% 93%    Physical Exam  Nursing note and vitals reviewed. Constitutional: She is oriented to person, place, and time. She appears well-developed and well-nourished. No distress.  Non-toxic  HENT:  Head: Normocephalic and atraumatic.  Right Ear: External ear normal.  Left Ear: External ear normal.  Nose: Nose normal.  Mouth/Throat: Oropharynx is clear and moist. No oropharyngeal exudate.  No erythema to the posterior pharynx. Tonsils without edema or exudates. Uvula midline. No trismus. No difficulty controlling secretions.   Eyes: Conjunctivae are normal. Pupils are equal, round, and reactive to light. Right eye exhibits no discharge. Left eye exhibits no discharge.  Neck: Normal range of motion. Neck supple.  Cardiovascular: Normal rate, regular rhythm, normal heart sounds and intact distal pulses.  Exam reveals no gallop and no friction rub.   No murmur heard. Pulmonary/Chest: She is in respiratory distress. She has wheezes. She exhibits no tenderness.  Increased work of effort. Patient able to speak in complete sentences. Inspiratory and expiratory wheezing throughout. Coarse breath sounds.   Abdominal: Soft. She exhibits no distension. There is no tenderness.  Musculoskeletal: Normal range of motion. She exhibits no edema and no tenderness.  No LE edema or calf tenderness bilaterally  Neurological: She is alert and oriented to person, place, and time.  Skin: Skin is warm and dry. She is not diaphoretic.    ED Course  Procedures (including critical care time) Labs Review Labs Reviewed - No data to display  Imaging Review Dg Chest 2 View  04/30/2014   CLINICAL DATA:  COPD, shortness of breath  EXAM:  CHEST  2 VIEW  COMPARISON:  11/27/2012  FINDINGS: Cardiomediastinal silhouette is stable. No acute infiltrate or pleural effusion. No pulmonary edema. Stable chronic mild interstitial prominence. Mild hyperinflation. Bony thorax is unremarkable.  IMPRESSION: No active disease. Mild hyperinflation. Stable chronic mild interstitial prominence.   Electronically Signed   By: Lahoma Crocker M.D.   On: 04/30/2014 13:05     EKG Interpretation   Date/Time:  Wednesday April 30 2014 12:16:16 EDT Ventricular Rate:  88 PR Interval:  159 QRS Duration: 93 QT Interval:  420 QTC Calculation: 508 R Axis:   -25 Text Interpretation:  Sinus rhythm Borderline left axis deviation Low  voltage, precordial leads Borderline repolarization abnormality Borderline  prolonged QT interval No significant change since last tracing Confirmed  by ZACKOWSKI  MD, SCOTT [phone])  on 04/30/2014 12:29:27 PM      Results for orders placed during the hospital encounter of 04/30/14  PRO B NATRIURETIC PEPTIDE      Result Value Ref Range   Pro B Natriuretic peptide (BNP) 77.7  0 - 125 pg/mL  CBC WITH DIFFERENTIAL      Result Value Ref Range   WBC 11.5 (*) 4.0 - 10.5 K/uL   RBC 5.15 (*) 3.87 - 5.11 MIL/uL   Hemoglobin 15.0  12.0 - 15.0 g/dL   HCT 44.0  36.0 - 46.0 %   MCV 85.4  78.0 - 100.0 fL   MCH 29.1  26.0 - 34.0 pg   MCHC 34.1  30.0 - 36.0 g/dL   RDW 15.3  11.5 - 15.5 %   Platelets 249  150 - 400 K/uL   Neutrophils Relative % 76  43 - 77 %   Neutro Abs 8.7 (*) 1.7 - 7.7 K/uL   Lymphocytes Relative 17  12 - 46 %   Lymphs Abs 1.9  0.7 - 4.0 K/uL   Monocytes Relative 6  3 - 12 %   Monocytes Absolute 0.7  0.1 - 1.0 K/uL   Eosinophils Relative 1  0 - 5 %   Eosinophils Absolute 0.1  0.0 - 0.7 K/uL   Basophils Relative 0  0 - 1 %   Basophils Absolute 0.0  0.0 - 0.1 K/uL  BASIC METABOLIC PANEL      Result Value Ref Range   Sodium 140  137 - 147 mEq/L   Potassium 4.1  3.7 - 5.3 mEq/L   Chloride 99  96 - 112 mEq/L   CO2 24   19 - 32 mEq/L   Glucose, Bld 108 (*) 70 - 99 mg/dL   BUN 5 (*) 6 - 23 mg/dL   Creatinine, Ser 0.55  0.50 - 1.10 mg/dL   Calcium 9.6  8.4 - 10.5 mg/dL   GFR calc non Af Amer >90  >90 mL/min   GFR calc Af Amer >90  >90 mL/min     [name] [name]   PRISCILLA KIRSTEIN is a 59 y.o. female with a PMH of COPD, SOB, recurrent URI's, GERD, insomnia, abdominal spasms, depression and obestiy who presents to the ED for evaluation of cough and SOB. Symptoms likely due to a COPD exacerbation. Patient given solumedrol in the ED as well as Duoneb x 2 with no improvements in her symptoms. Patient started on levaquin. Chest x-ray negative for an acute cardiopulmonary process. Mild leukocytosis (11.5). Patient afebrile and non-toxic in appearance. Patient with hypoxia in the high 80's and low 90's without O2 supplementation. Admit for further evaluation and management.    Final impressions: 1. COPD exacerbation      Mercy Moore PA-C   This patient was discussed with Dr. Huel Cote, PA-C 04/30/14 2257

## 2014-05-01 DIAGNOSIS — J449 Chronic obstructive pulmonary disease, unspecified: Secondary | ICD-10-CM

## 2014-05-01 DIAGNOSIS — F329 Major depressive disorder, single episode, unspecified: Secondary | ICD-10-CM

## 2014-05-01 DIAGNOSIS — F3289 Other specified depressive episodes: Secondary | ICD-10-CM

## 2014-05-01 LAB — CBC
HCT: 46.4 % — ABNORMAL HIGH (ref 36.0–46.0)
Hemoglobin: 15 g/dL (ref 12.0–15.0)
MCH: 28.5 pg (ref 26.0–34.0)
MCHC: 32.3 g/dL (ref 30.0–36.0)
MCV: 88.2 fL (ref 78.0–100.0)
Platelets: ADEQUATE 10*3/uL (ref 150–400)
RBC: 5.26 MIL/uL — ABNORMAL HIGH (ref 3.87–5.11)
RDW: 15.3 % (ref 11.5–15.5)
WBC: 14.4 10*3/uL — AB (ref 4.0–10.5)

## 2014-05-01 LAB — BASIC METABOLIC PANEL
BUN: 9 mg/dL (ref 6–23)
CO2: 27 meq/L (ref 19–32)
Calcium: 9.8 mg/dL (ref 8.4–10.5)
Chloride: 100 mEq/L (ref 96–112)
Creatinine, Ser: 0.53 mg/dL (ref 0.50–1.10)
GFR calc Af Amer: >90 mL/min (ref >90)
GFR calc non Af Amer: >90 mL/min (ref >90)
Glucose, Bld: 136 mg/dL — ABNORMAL HIGH (ref 70–99)
Potassium: 4.3 mEq/L (ref 3.7–5.3)
Sodium: 143 mEq/L (ref 137–147)

## 2014-05-01 MED ORDER — OXYCODONE HCL 5 MG PO TABS: 5.0000 mg | ORAL_TABLET | Freq: Once | ORAL | Status: DC

## 2014-05-01 MED ORDER — LORAZEPAM 2 MG/ML IJ SOLN: 0.5000 mg | Freq: Once | INTRAMUSCULAR | Status: DC

## 2014-05-01 MED ORDER — ACETAMINOPHEN 325 MG PO TABS
650.0000 mg | ORAL_TABLET | Freq: Four times a day (QID) | ORAL | Status: DC | PRN
Start: 2014-05-01 — End: 2014-05-02
  Administered 2014-05-01: 650 mg via ORAL
  Filled 2014-05-01: qty 2

## 2014-05-01 MED ORDER — GUAIFENESIN-CODEINE 100-10 MG/5ML PO SOLN
5.0000 mL | ORAL | Status: DC | PRN
  Administered 2014-05-01 – 2014-05-02 (×3): 5 mL via ORAL
  Filled 2014-05-01 (×2): qty 5

## 2014-05-01 MED ORDER — LORAZEPAM 0.5 MG PO TABS
0.5000 mg | ORAL_TABLET | Freq: Two times a day (BID) | ORAL | Status: DC | PRN
  Administered 2014-05-01 – 2014-05-02 (×2): 0.5 mg via ORAL
  Filled 2014-05-01 (×2): qty 1

## 2014-05-01 MED ORDER — BENZONATATE 100 MG PO CAPS: 100.0000 mg | ORAL_CAPSULE | Freq: Two times a day (BID) | ORAL | Status: DC | PRN

## 2014-05-01 NOTE — Progress Notes (Signed)
Utilization review completed.  

## 2014-05-01 NOTE — Clinical Social Work Note (Signed)
CSW consulted for COPD gold protocol. Inappropriate consult, RNCM also consulted. CSW signing off.  Lubertha Sayres, MSW, [facility] Licensed Clinical Social Worker [phone] and [phone]

## 2014-05-01 NOTE — Care Management Note (Signed)
    Page 1 of 1   05/01/2014     10:45:00 AM CARE MANAGEMENT NOTE 05/01/2014  Patient:  [name] [name], [name] [name]   Account Number:  [id number]  Date Initiated:  05/01/2014  Documentation initiated by:  Magdalen Spatz  Subjective/Objective Assessment:     Action/Plan:   Anticipated DC Date:     Anticipated DC Plan:  [name]         Choice offered to / List presented to:  [name]        [name] arranged  [name]      [name]   Status of service:  Completed, signed off Medicare Important Message given?   (If response is "NO", the following Medicare IM given date fields will be blank) Date Medicare IM given:   Date Additional Medicare IM given:    Discharge Disposition:    Per UR Regulation:    If discussed at Long Length of Stay Meetings, dates discussed:    Comments:  05-01-14 Confirmed face sheet information with patient . Patient has home oxygen through [place] Patient . Magdalen Spatz RN BSN [phone]

## 2014-05-01 NOTE — Evaluation (Signed)
Physical Therapy Evaluation Patient Details Name: [name] [name] MRN: [id number] DOB: [birth year] Today's Date: 05/01/2014   History of Present Illness  Ms. MINETTE MANDERS is a 58 y.o. female w/ PMHx of COPD, GERD, Insomnia, depression, and obesity, presents to the ED w/ complaints of SOB and cough and was admitted with COPD exacerbation.   Clinical Impression  Pt admitted with COPD excerbation. Pt currently with functional limitations due to the deficits listed below (see PT Problem List). Pt will benefit from skilled PT to increase their independence and safety with mobility to allow discharge to the venue listed below. Pt able to ambulate without AD and should progress well as her COPD symptoms are managed.      Follow Up Recommendations No PT follow up    Equipment Recommendations  None recommended by PT    Recommendations for Other Services       Precautions / Restrictions        Mobility  Bed Mobility Overal bed mobility: Modified Independent                Transfers Overall transfer level: Modified independent                  Ambulation/Gait Ambulation/Gait assistance: Supervision Ambulation Distance (Feet): 150 Feet Assistive device: None Gait Pattern/deviations: Step-through pattern     General Gait Details: Slightly shaky with initial gait which subsided as gait progressed.  o2 at 3L/min with sat 95%  Stairs            Wheelchair Mobility    Modified Rankin (Stroke Patients Only)       Balance Overall balance assessment: No apparent balance deficits (not formally assessed)                                           Pertinent Vitals/Pain No pain    Home Living Family/patient expects to be discharged to:: Private residence Living Arrangements: Spouse/significant other Available Help at Discharge: Available PRN/intermittently Type of Home: House Home Access: Stairs to enter Entrance Stairs-Rails:  Right Entrance Stairs-Number of Steps: 4 Home Layout: One level Home Equipment: None Additional Comments: Wears oxygen at night sometimes    Prior Function Level of Independence: Independent               Hand Dominance        Extremity/Trunk Assessment   Upper Extremity Assessment: Defer to OT evaluation           Lower Extremity Assessment: Overall WFL for tasks assessed      Cervical / Trunk Assessment: Normal  Communication   Communication: No difficulties  Cognition Arousal/Alertness: Awake/alert                          General Comments      Exercises        Assessment/Plan    PT Assessment Patient needs continued PT services  PT Diagnosis Difficulty walking   PT Problem List Decreased activity tolerance;Decreased mobility;Cardiopulmonary status limiting activity  PT Treatment Interventions Gait training;Stair training   PT Goals (Current goals can be found in the Care Plan section) Acute Rehab PT Goals Patient Stated Goal: go home PT Goal Formulation: With patient Time For Goal Achievement: 05/08/14 Potential to Achieve Goals: Good    Frequency Min 3X/week   Barriers to discharge  Co-evaluation               End of Session Equipment Utilized During Treatment: Oxygen Activity Tolerance: Patient tolerated treatment well Patient left: in chair Nurse Communication: Mobility status;Other (comment) (o2 sats)    Functional Assessment Tool Used: clinical judgement and objective findings Functional Limitation: Mobility: Walking and moving around Mobility: Walking and Moving Around Current Status [phone]): At least 1 percent but less than 20 percent impaired, limited or restricted Mobility: Walking and Moving Around Goal Status [phone]): 0 percent impaired, limited or restricted    Time: 5809-9833 PT Time Calculation (min): 17 min   Charges:   PT Evaluation $Initial PT Evaluation Tier I: 1 Procedure PT  Treatments $Gait Training: 8-22 mins   PT G Codes:   Functional Assessment Tool Used: clinical judgement and objective findings Functional Limitation: Mobility: Walking and moving around    [place] 05/01/2014, 12:05 PM

## 2014-05-01 NOTE — ED Provider Notes (Signed)
Medical screening examination/treatment/procedure(s) were conducted as a shared visit with non-physician practitioner(s) and myself.  I personally evaluated the patient during the encounter.   EKG Interpretation   Date/Time:  Wednesday April 30 2014 12:16:16 EDT Ventricular Rate:  88 PR Interval:  159 QRS Duration: 93 QT Interval:  420 QTC Calculation: 508 R Axis:   -25 Text Interpretation:  Sinus rhythm Borderline left axis deviation Low  voltage, precordial leads Borderline repolarization abnormality Borderline  prolonged QT interval No significant change since last tracing Confirmed  by Rogene Houston  MD, [place] ([id number]) on 04/30/2014 12:29:27 PM       Fredia Sorrow, MD 05/01/14 1546

## 2014-05-01 NOTE — Progress Notes (Signed)
Brief Nutrition Note  RD consulted for assessment of nutrition requirements and status. Patient with morbid obesity. She reports that she eats very well. Reports high intake of sweets and fried foods. She lost about 60 pounds last year, but got off track.    Body mass index is 46.95 kg/(m^2). Pt meets criteria for morbid obesity based on current BMI.  Wt Readings from Last 10 Encounters:  04/30/14 299 lb 13.2 oz (136 kg)  03/05/14 299 lb (135.626 kg)  12/05/13 290 lb 3.2 oz (131.634 kg)  10/07/13 283 lb 3.2 oz (128.459 kg)  09/05/13 290 lb 14.4 oz (131.951 kg)  08/26/13 291 lb (131.997 kg)  07/25/13 307 lb (139.254 kg)  06/27/13 323 lb 1.6 oz (146.557 kg)  05/30/13 314 lb 6.4 oz (142.611 kg)  05/28/13 318 lb (144.244 kg)    RD provided "Weight Loss Tips" handout from the Academy of [employer] Dietetics. Emphasized the importance of serving sizes and provided examples of correct portions of common foods. Discussed importance of controlled and consistent intake throughout the day. Provided examples of ways to balance meals/snacks and encouraged intake of high-fiber, whole grain complex carbohydrates. Emphasized the importance of hydration with calorie-free beverages and limiting sugar-sweetened beverages. Encouraged pt to discuss physical activity options with physician. Teach back method used.  Expect fair-good compliance. Unclear if patient is committed to making dietary changes.   Current diet order is Heart Healthy, patient is consuming approximately 100% of meals at this time. Labs and medications reviewed. No further nutrition interventions warranted at this time. RD contact information provided. If additional nutrition issues arise, please re-consult RD.  Larey Seat, RD, LDN Pager #: [phone] After-Hours Pager #: [phone]

## 2014-05-01 NOTE — H&P (Signed)
  Date: 05/01/2014  Patient name: [name] [name]  Medical record number: [id number]  Date of birth: 1956-04-06   I have seen and evaluated [name] [name] and discussed their care with the Residency Team.   Briefly, [name] [name] is a 58yo woman with PMH of Gold stage 4 COPD (by PFTs from 12/2012), insomnia, depression who presented with worsening SOB X 1 week and cough productive of green sputum.  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  Further associated symptoms included wheezing, and chills but no fever.  She has had increased use of her inhalers.  She reports only using her dulera on a PRN basis at home.  She has no symptoms of CHF including orthopnea or PND.  She has been admitted for COPD in the past after having the flu.  She received medication in the ED including levaquin, breathing treatments, oxygen and steroids.  CXR showed no acute process and mild hyperinflation.  She has a mild leukocytosis and is receiving steroids.  On exam, she was improved when I saw her, sitting up in bed, in no distress.  Expiratory wheezing had improved and was mainly noticeable anteriorly.  She is obese, RR, NR, + BS on abdomen exam.    Assessment and Plan: I have seen and evaluated the patient as outlined above. I agree with the formulated Assessment and Plan as detailed in the residents' admission note, with the following changes:   1. COPD exacerbation in the setting of GOLD stage 4 COPD - Agree with plan to admit and have q4 hours breathing treatments, levaquin given severity of COPD, steroids for a short course - She should be advised to use dulera on a daily basis - Consults placed to social work, PT, OT - Oxygen as needed, she does use this at home as well - IS to bedside  Other issues are chronic and noted in Dr. Ronnald Ramp' note.   She can likely be discharged once breathing back to baseline.   Sid Falcon, MD 6/25/201510:59 AM

## 2014-05-01 NOTE — Progress Notes (Signed)
Subjective: Patient seen at bedside this AM. Breathing improved, less SOB. Wheezing decreased on exam. No fever, chills, nausea, vomiting, or chest pain. Still with significant cough and some back pain this AM.   Objective: Vital signs in last 24 hours: Filed Vitals:   04/30/14 2042 04/30/14 2159 05/01/14 0557 05/01/14 0838  BP:  123/50 115/62   Pulse:  89 85   Temp:  97.7 F (36.5 C) 97.6 F (36.4 C)   TempSrc:  Oral Oral   Resp:  22 21   Height:      Weight:      SpO2: 93% 93% 93% 95%   Weight change:   Intake/Output Summary (Last 24 hours) at 05/01/14 1130 Last data filed at 05/01/14 0500  Gross per 24 hour  Intake    660 ml  Output    200 ml  Net    460 ml   Physical Exam: General: Alert, cooperative, NAD. HEENT: PERRL, EOMI. Moist mucus membranes Neck: Full range of motion without pain, supple, no lymphadenopathy or carotid bruits Lungs: Clear to ascultation bilaterally, normal work of respiration. Diffuse, bilateral end-expiratory wheezes. Improved from yesterday.  Heart: RRR, no murmurs, gallops, or rubs Abdomen: Soft, non-tender, non-distended, BS + Extremities: No cyanosis, clubbing, or edema Neurologic: Alert & oriented X3, cranial nerves II-XII intact, strength grossly intact, sensation intact to light touch  Lab Results: Basic Metabolic Panel:  Recent Labs Lab 04/30/14 1155 05/01/14 0513  NA 140 143  K 4.1 4.3  CL 99 100  CO2 24 27  GLUCOSE 108* 136*  BUN 5* 9  CREATININE 0.55 0.53  CALCIUM 9.6 9.8   CBC:  Recent Labs Lab 04/30/14 1155 05/01/14 0513  WBC 11.5* 14.4*  NEUTROABS 8.7*  --   HGB 15.0 15.0  HCT 44.0 46.4*  MCV 85.4 88.2  PLT 249 PLATELET CLUMPS NOTED ON SMEAR, COUNT APPEARS ADEQUATE   BNP:  Recent Labs Lab 04/30/14 1155  PROBNP 77.7    Studies/Results: Dg Chest 2 View  04/30/2014   CLINICAL DATA:  COPD, shortness of breath  EXAM: CHEST  2 VIEW  COMPARISON:  11/27/2012  FINDINGS: Cardiomediastinal silhouette is  stable. No acute infiltrate or pleural effusion. No pulmonary edema. Stable chronic mild interstitial prominence. Mild hyperinflation. Bony thorax is unremarkable.  IMPRESSION: No active disease. Mild hyperinflation. Stable chronic mild interstitial prominence.   Electronically Signed   By: Lahoma Crocker M.D.   On: 04/30/2014 13:05   Medications: I have reviewed the patient's current medications. Scheduled Meds: . citalopram  40 mg Oral QHS  . enoxaparin (LOVENOX) injection  40 mg Subcutaneous Q24H  . ipratropium-albuterol  3 mL Nebulization QID  . levofloxacin  750 mg Oral Daily  . mometasone-formoterol  2 puff Inhalation BID  . oxyCODONE  5 mg Oral Once  . predniSONE  40 mg Oral Q breakfast  . traZODone  150 mg Oral QHS   Continuous Infusions:  PRN Meds:.acetaminophen, guaiFENesin-codeine, ipratropium-albuterol  Assessment/Plan: Ms. WAVE CALZADA is a 58 y.o. female w/ PMHx of COPD, GERD, Insomnia, depression, and obesity, admitted for COPD exacerbation.   Acute on Chronic COPD- SOB improved today, still w/ mild end-expiratory wheezes. Still w/ significant cough productive of sputum.  -Continue Levaquin 750 mg po qd.  -Prednisone 40 mg po qd for 5 days (end date 05/05/14). -Continue Duonebs q6h scheduled, q4h prn. -Gaufenesin-codeine 5 mL q4h prn -O2 via [place] prn -Incentive spirometry q4h  -COPD Gold protocol, SW, PT, OT  Depression- Stable,  follows w/ Dr Adele Schilder.  -Continue Celexa + Trazodone  Dispo: Disposition is deferred at this time, awaiting improvement of current medical problems.  Anticipated discharge in approximately 1-2 day(s).   The patient does have a current PCP Bartholomew Crews, MD) and does need an [facility] hospital follow-up appointment after discharge.  The patient does not have transportation limitations that hinder transportation to clinic appointments.  .Services Needed at time of discharge: Y = Yes, Blank = No PT:   OT:   RN:   Equipment:   Other:      LOS: 1 day   Corky Sox, MD 05/01/2014, 11:30 AM

## 2014-05-01 NOTE — Evaluation (Signed)
Occupational Therapy Evaluation Patient Details Name: [name] [name] MRN: [id number] DOB: 18-Mar-1956 Today's Date: 05/01/2014    History of Present Illness [name] [name] is a 58 y.o. female w/ PMHx of COPD, GERD, Insomnia, depression, and obesity, presents to the ED w/ complaints of SOB and cough and was admitted with COPD exacerbation.    Clinical Impression   Patient evaluated by Occupational Therapy with no further acute OT needs identified. All education has been completed and the patient has no further questions. Pt is modified independent with BADLs See below for any follow-up Occupational Therapy or equipment needs. OT is signing off. Thank you for this referral.     Follow Up Recommendations  No OT follow up;Supervision - Intermittent    Equipment Recommendations  None recommended by OT    Recommendations for Other Services       Precautions / Restrictions Precautions Precautions: None      Mobility Bed Mobility Overal bed mobility: Modified Independent                Transfers Overall transfer level: Modified independent Equipment used: None                  Balance Overall balance assessment: No apparent balance deficits (not formally assessed)                                          ADL Overall ADL's : Modified independent                                       General ADL Comments: Discussed energy conservation techniques with pt and spouse.  Pt asking about grab bars for shower and resources for installation.  Instructed them that grab bars need to be installed into the studs.       Vision                     Perception     Praxis      Pertinent Vitals/Pain Denies pain      Hand Dominance     Extremity/Trunk Assessment Upper Extremity Assessment Upper Extremity Assessment: Overall WFL for tasks assessed   Lower Extremity Assessment Lower Extremity Assessment: Defer to PT  evaluation   Cervical / Trunk Assessment Cervical / Trunk Assessment: Normal   Communication Communication Communication: No difficulties   Cognition Arousal/Alertness: Awake/alert Behavior During Therapy: WFL for tasks assessed/performed Overall Cognitive Status: Within Functional Limits for tasks assessed                     General Comments       Exercises       Shoulder Instructions      Home Living Family/patient expects to be discharged to:: Private residence Living Arrangements: Spouse/significant other Available Help at Discharge: Available PRN/intermittently Type of Home: House Home Access: Stairs to enter [employer] of Steps: 4 Entrance Stairs-Rails: Right Home Layout: One level     Bathroom Shower/Tub: Tub/shower unit;Door Shower/tub characteristics: Charity fundraiser: Standard     Home Equipment: None   Additional Comments: Wears oxygen at night sometimes      Prior Functioning/Environment Level of Independence: Independent        Comments: Pt reports she takes breaks when performing IADLs  OT Diagnosis:     OT Problem List:     OT Treatment/Interventions:      OT Goals(Current goals can be found in the care plan section) Acute Rehab OT Goals Patient Stated Goal: go home  OT Frequency:     Barriers to D/C:            Co-evaluation              End of Session Equipment Utilized During Treatment: Oxygen  Activity Tolerance: Patient tolerated treatment well Patient left: in chair;with call bell/phone within reach;with family/visitor present   Time: 2229-7989 OT Time Calculation (min): 16 min Charges:  OT General Charges $OT Visit: 1 Procedure OT Evaluation $Initial OT Evaluation Tier I: 1 Procedure G-Codes: OT G-codes **NOT FOR INPATIENT CLASS** Functional Limitation: Self care Self Care Current Status (Q1194): At least 1 percent but less than 20 percent impaired, limited or restricted Self Care  Goal Status (R7408): At least 1 percent but less than 20 percent impaired, limited or restricted Self Care Discharge Status [phone]): At least 1 percent but less than 20 percent impaired, limited or restricted  [place], Wendi M 05/01/2014, 12:32 PM

## 2014-05-01 NOTE — Progress Notes (Signed)
05/01/14 0839 continuous pulse

## 2014-05-02 MED ORDER — IPRATROPIUM-ALBUTEROL 0.5-2.5 (3) MG/3ML IN SOLN
3.0000 mL | Freq: Three times a day (TID) | RESPIRATORY_TRACT | Status: DC
Start: 2014-05-02 — End: 2014-05-02

## 2014-05-02 MED ORDER — LEVOFLOXACIN 750 MG PO TABS: 750.0000 mg | ORAL_TABLET | Freq: Every day | ORAL | Status: DC

## 2014-05-02 MED ORDER — GUAIFENESIN-CODEINE 100-10 MG/5ML PO SOLN
5.0000 mL | ORAL | Status: DC | PRN
Start: 2014-05-02 — End: 2014-05-15

## 2014-05-02 MED ORDER — MOMETASONE FURO-FORMOTEROL FUM 100-5 MCG/ACT IN AERO: 2.0000 | INHALATION_SPRAY | Freq: Two times a day (BID) | RESPIRATORY_TRACT | Status: DC

## 2014-05-02 MED ORDER — PREDNISONE 20 MG PO TABS: 40.0000 mg | ORAL_TABLET | Freq: Every day | ORAL | Status: DC

## 2014-05-02 NOTE — Discharge Summary (Signed)
Name: [name] [name] MRN: [id number] DOB: 1956-04-23 58 y.o. PCP: Bartholomew Crews, MD  Date of Admission: 04/30/2014 11:02 AM Date of Discharge: 05/02/2014 Attending Physician: Sid Falcon, MD  Discharge Diagnosis: 1. Acute on Chronic COPD 2. Depression/anxiety  Discharge Medications:   Medication List         albuterol (5 MG/ML) 0.5% nebulizer solution  Commonly known as:  PROVENTIL  Take 2.5 mg by nebulization every 6 (six) hours as needed for wheezing or shortness of breath.     citalopram 40 MG tablet  Commonly known as:  CELEXA  Take 1 tablet (40 mg total) by mouth at bedtime.     guaiFENesin-codeine 100-10 MG/5ML syrup  Take 5 mLs by mouth every 4 (four) hours as needed for cough.     ipratropium 0.02 % nebulizer solution  Commonly known as:  ATROVENT  Take 0.5 mg by nebulization every 6 (six) hours as needed for wheezing or shortness of breath.     levofloxacin 750 MG tablet  Commonly known as:  LEVAQUIN  Take 1 tablet (750 mg total) by mouth daily.     mometasone-formoterol 100-5 MCG/ACT Aero  Commonly known as:  DULERA  Inhale 2 puffs into the lungs 2 (two) times daily.     predniSONE 20 MG tablet  Commonly known as:  DELTASONE  Take 2 tablets (40 mg total) by mouth daily with breakfast.     traZODone 150 MG tablet  Commonly known as:  DESYREL  Take 1 tablet (150 mg total) by mouth at bedtime.        Disposition and follow-up:   [name] [name] was discharged from [facility] in Good condition.  At the hospital follow up visit please address:  1.  COPD; Has patient finished Prednisone and Levaquin coverage? Any further SOB or significant wheezing at home? How frequently has patient been needing to use her nebulizer? Has patient been using Dulera bid (had previously been bid prn)? Has cough subsided? Any fever or chills at home?  Anxiety; Any further issues w/ anxiety attacks?   2.  Labs / imaging needed at time of  follow-up: none  3.  Pending labs/ test needing follow-up: none  Follow-up Appointments:     Follow-up Information   Follow up with Larey Dresser, MD On 05/08/2014. (10:45 AM)    Specialty:  Internal Medicine   Contact information:   [place] [address] [phone]       Discharge Instructions: Discharge Instructions   Call MD for:  difficulty breathing, headache or visual disturbances    Complete by:  As directed      Call MD for:  persistant dizziness or light-headedness    Complete by:  As directed      Call MD for:  temperature >100.4    Complete by:  As directed            Consultations:  none  Procedures Performed:  Dg Chest 2 View  04/30/2014   CLINICAL DATA:  COPD, shortness of breath  EXAM: CHEST  2 VIEW  COMPARISON:  11/27/2012  FINDINGS: Cardiomediastinal silhouette is stable. No acute infiltrate or pleural effusion. No pulmonary edema. Stable chronic mild interstitial prominence. Mild hyperinflation. Bony thorax is unremarkable.  IMPRESSION: No active disease. Mild hyperinflation. Stable chronic mild interstitial prominence.   Electronically Signed   By: Lahoma Crocker M.D.   On: 04/30/2014 13:05   Admission HPI: [name] [name] is a  58 y.o. female w/ PMHx of COPD, GERD, Insomnia, depression, and obesity, presents to the ED w/ complaints of SOB and cough. The patient claims she has had worsening SOB over the past week accompanied by a cough productive of abundant greenish colored sputum. She claims this started after she was cleaning at her church which involved a lot of mold and mildew and says her breathing significantly worsened after that. She claims she is having associated wheezing and mild chills as well. She claims she has had to use her nebulizer t home at least 4 times daily over the past 2 days d/t wheezing and SOB. She denies associated chest pain, dizziness, lightheadedness, palpitations, nausea, vomiting, abdominal pain,  diarrhea, or dysuria. She also denies significant LE swelling, PND, orthopnea, or significant weight change.  According to the patient, she has been admitted previously for COPD exacerbation, according to hospital notes, 11/2012 after having the flu. The patient has documented PFT's from 12/2012 showing FEV1 48% of predicted, classifying her as severe COPD. CXR in ED shows no active disease w/ mild hyperinflation and stable chronic mild interstitial prominence. Also w/ mild leukocytosis of 11.5 on admission, pBNP of 77.7.   Hospital Course by problem list:   1. Acute on Chronic COPD- Patient w/ SOB, worsened cough w/ increased sputum production, and diffuse wheezes on admission. Was requiring 4 nebulizer treatments daily for SOB. Uses Dulera prn at home. PFT's from 12/2012 suggestive of severe COPD by Gold's Criteria (48% of predicted). No fever. Leukocytosis of 11.7 on admission. CXR w/ no active disease, mild hyperinflation, and stable chronic mild interstitial prominence. Given Duonebs x2 in ED + Solu-Medrol 125 mg IV + Levaquin 750 mg po in ED. No signs of CHF; no PND, orthopnea, bibisilar crackles, or signs of volume overload. Based on criteria given her severe COPD, patient was covered for pseudomonas and continued on Levaquin 750 mg po qd (for total of 5 days). Started Prednisone 40 mg qd on 05/01/14 to be continued for 5 days. Patient received Duonebs q4h scheduled initially, then tapered down to tid prior to discharge. Also continued Dulera bid as well as 3L O2 via [place] prn (uses this qhs at home). Also gave guafenesis-codeine prn for cough. Patient able to ambulate on RA on day of discharge and maintain SpO2 around 92-94%. Wheezing and SOB significantly improved on discharge.   2. Depression/anxiety- Follows w/ Dr. Adele [facility] at [facility], takes Celexa + Trazodone at home. Had one issue w/ anxiety attack during admission associated w/ SOB and nebulizer treatment. Gave Ativan 0.5 mg once w/ improvement in symptoms.  DO NOT recommend patient continue taking anxiolytics. Discussed at length w/ regards to controlling anxiety symptoms by using deep breathing and recognizing when an anxiety attack is occuring. Patient understood.    Discharge Vitals:   BP 92/53  Pulse 111  Temp(Src) 97.9 F (36.6 C) (Oral)  Resp 22  Ht 5\' 7"  (1.702 m)  Wt 299 lb 13.2 oz (136 kg)  BMI 46.95 kg/m2  SpO2 92%  Discharge Labs:  No results found for this or any previous visit (from the past 24 hour(s)).  Signed: Corky Sox, MD 05/02/2014, 11:22 AM    Services Ordered on Discharge: none Equipment Ordered on Discharge: none

## 2014-05-02 NOTE — Progress Notes (Signed)
Subjective: Patient seen at bedside this AM. Resting comfortably, only mild end-expiratory wheezes on exam. SpO2 96-98% on 3L [place]. No fever, chills, nausea, or vomiting. Still w/ cough, however, somewhat improved. No anxiety overnight.   Objective: Vital signs in last 24 hours: Filed Vitals:   05/01/14 2008 05/01/14 2239 05/02/14 0517 05/02/14 0817  BP:  110/51 92/53   Pulse: 88 93 79   Temp:  97.9 F (36.6 C) 97.9 F (36.6 C)   TempSrc:  Oral    Resp: 18 18 16    Height:      Weight:      SpO2: 96% 95% 92% 94%   Weight change:   Intake/Output Summary (Last 24 hours) at 05/02/14 0820 Last data filed at 05/02/14 0610  Gross per 24 hour  Intake      0 ml  Output      0 ml  Net      0 ml   Physical Exam: General: Alert, cooperative, NAD. HEENT: PERRL, EOMI. Moist mucus membranes Neck: Full range of motion without pain, supple, no lymphadenopathy or carotid bruits Lungs: Clear to ascultation bilaterally, normal work of respiration. Mild, diffuse end-expiratory wheezes.  Heart: RRR, no murmurs, gallops, or rubs Abdomen: Soft, non-tender, non-distended, BS + Extremities: No cyanosis, clubbing, or edema Neurologic: Alert & oriented X3, cranial nerves II-XII intact, strength grossly intact, sensation intact to light touch  Lab Results: Basic Metabolic Panel:  Recent Labs Lab 04/30/14 1155 05/01/14 0513  NA 140 143  K 4.1 4.3  CL 99 100  CO2 24 27  GLUCOSE 108* 136*  BUN 5* 9  CREATININE 0.55 0.53  CALCIUM 9.6 9.8   CBC:  Recent Labs Lab 04/30/14 1155 05/01/14 0513  WBC 11.5* 14.4*  NEUTROABS 8.7*  --   HGB 15.0 15.0  HCT 44.0 46.4*  MCV 85.4 88.2  PLT 249 PLATELET CLUMPS NOTED ON SMEAR, COUNT APPEARS ADEQUATE   BNP:  Recent Labs Lab 04/30/14 1155  PROBNP 77.7    Studies/Results: Dg Chest 2 View  04/30/2014   CLINICAL DATA:  COPD, shortness of breath  EXAM: CHEST  2 VIEW  COMPARISON:  11/27/2012  FINDINGS: Cardiomediastinal silhouette is stable. No  acute infiltrate or pleural effusion. No pulmonary edema. Stable chronic mild interstitial prominence. Mild hyperinflation. Bony thorax is unremarkable.  IMPRESSION: No active disease. Mild hyperinflation. Stable chronic mild interstitial prominence.   Electronically Signed   By: Lahoma Crocker M.D.   On: 04/30/2014 13:05   Medications: I have reviewed the patient's current medications. Scheduled Meds: . citalopram  40 mg Oral QHS  . enoxaparin (LOVENOX) injection  40 mg Subcutaneous Q24H  . ipratropium-albuterol  3 mL Nebulization QID  . levofloxacin  750 mg Oral Daily  . mometasone-formoterol  2 puff Inhalation BID  . oxyCODONE  5 mg Oral Once  . predniSONE  40 mg Oral Q breakfast  . traZODone  150 mg Oral QHS   Continuous Infusions:  PRN Meds:.acetaminophen, guaiFENesin-codeine, ipratropium-albuterol, LORazepam  Assessment/Plan: Ms. [name] [name] is a 58 y.o. female w/ PMHx of COPD, GERD, Insomnia, depression, and obesity, admitted for COPD exacerbation.   Acute on Chronic COPD- Continues to improve. SpO2 96-98% on 3L [place]. SOB controlled, cough improved. Still w/ mild diffuse wheezing. Ambulated on RA, maintaining 92-94% SpO2.  -Continue Levaquin 750 mg po qd for total of 5 days (end date 05/04/14). -Prednisone 40 mg po qd for total of 5 days (end date 05/05/14). -Duonebs tid scheduled, q4h prn. -  Gaufenesin-codeine 5 mL q4h prn -O2 via [place] prn -Incentive spirometry q4h   Depression- Stable, follows w/ Dr Adele Schilder. Acute anxiety yesterday afternoon, improved w/ 0.5 Ativan. -Continue Celexa + Trazodone -Ativan 0.5 mg bid prn  Dispo: Discharge today.  The patient does have a current PCP Bartholomew Crews, MD) and does need an [facility] hospital follow-up appointment after discharge.  The patient does not have transportation limitations that hinder transportation to clinic appointments.  .Services Needed at time of discharge: Y = Yes, Blank = No PT:   OT:   RN:   Equipment:   Other:       LOS: 2 days   Corky Sox, MD 05/02/2014, 8:20 AM

## 2014-05-02 NOTE — Progress Notes (Signed)
  Date: 05/02/2014  Patient name: [name] [name]  Medical record number: [id number]  Date of birth: 23-Sep-1956   This patient has been seen and the plan of care was discussed with the house staff. Please see their note for complete details. I concur with their findings with the following additions/corrections:  Patient improved today.  With some anxiety due to breathing difficulties and will need to discuss adjusted regimen with her PCP.  She is tolerating PO and ambulated without the need for oxygen.  She can return home today.   Sid Falcon, MD 05/02/2014, 1:48 PM

## 2014-05-02 NOTE — Discharge Instructions (Signed)
1. You have a follow up appointment as below:  Bartholomew Crews, MD  On 05/08/2014 @ 10:45 AM  [place] [address] [phone]  2. Please take all medications as prescribed.   Continue to use nebulizer as frequently as every 4 hours if needed.   Use oxygen when needed, especially at night.   Continue Levaquin until gone (2 more days, starting tomorrow)  Continue Prednisone 40 mg daily for 3 more days (2 pills daily until gone).  Use Robitussin AC every 4-6 hours AS NEEDED for coughing.  3. If you have worsening of your symptoms or new symptoms arise, please call the clinic ([phone]), or go to the ER immediately if symptoms are severe.    Chronic Obstructive Pulmonary Disease Chronic obstructive pulmonary disease (COPD) is a common lung condition in which airflow from the lungs is limited. COPD is a general term that can be used to describe many different lung problems that limit airflow, including both chronic bronchitis and emphysema. If you have COPD, your lung function will probably never return to normal, but there are measures you can take to improve lung function and make yourself feel better.  CAUSES   Smoking (common).   Exposure to secondhand smoke.   Genetic problems.  Chronic inflammatory lung diseases or recurrent infections. SYMPTOMS   Shortness of breath, especially with physical activity.   Deep, persistent (chronic) cough with a large amount of thick mucus.   Wheezing.   Rapid breaths (tachypnea).   Gray or bluish discoloration (cyanosis) of the skin, especially in fingers, toes, or lips.   Fatigue.   Weight loss.   Frequent infections or episodes when breathing symptoms become much worse (exacerbations).   Chest tightness. DIAGNOSIS  Your healthcare provider will take a medical history and perform a physical examination to make the initial diagnosis. Additional tests for COPD may include:   Lung (pulmonary)  function tests.  Chest X-ray.  CT scan.  Blood tests. TREATMENT  Treatment available to help you feel better when you have COPD include:   Inhaler and nebulizer medicines. These help manage the symptoms of COPD and make your breathing more comfortable  Supplemental oxygen. Supplemental oxygen is only helpful if you have a low oxygen level in your blood.   Exercise and physical activity. These are beneficial for nearly all people with COPD. Some people may also benefit from a pulmonary rehabilitation program. HOME CARE INSTRUCTIONS   Take all medicines (inhaled or pills) as directed by your health care provider.  Only take over-the-counter or prescription medicines for pain, fever, or discomfort as directed by your health care provider.   Avoid over-the-counter medicines or cough syrups that dry up your airway (such as antihistamines) and slow down the elimination of secretions unless instructed otherwise by your healthcare provider.   If you are a smoker, the most important thing that you can do is stop smoking. Continuing to smoke will cause further lung damage and breathing trouble. Ask your health care provider for help with quitting smoking. He or she can direct you to community resources or hospitals that provide support.  Avoid exposure to irritants such as smoke, chemicals, and fumes that aggravate your breathing.  Use oxygen therapy and pulmonary rehabilitation if directed by your health care provider. If you require home oxygen therapy, ask your healthcare provider whether you should purchase a pulse oximeter to measure your oxygen level at home.   Avoid contact with individuals who have a contagious illness.  Avoid extreme temperature and humidity changes.  Eat healthy foods. Eating smaller, more frequent meals and resting before meals may help you maintain your strength.  Stay active, but balance activity with periods of rest. Exercise and physical activity will help  you maintain your ability to do things you want to do.  Preventing infection and hospitalization is very important when you have COPD. Make sure to receive all the vaccines your health care provider recommends, especially the pneumococcal and influenza vaccines. Ask your healthcare provider whether you need a pneumonia vaccine.  Learn and use relaxation techniques to manage stress.  Learn and use controlled breathing techniques as directed by your health care provider. Controlled breathing techniques include:   Pursed lip breathing. Start by breathing in (inhaling) through your nose for 1 second. Then, purse your lips as if you were going to whistle and breathe out (exhale) through the pursed lips for 2 seconds.   Diaphragmatic breathing. Start by putting one hand on your abdomen just above your waist. Inhale slowly through your nose. The hand on your abdomen should move out. Then purse your lips and exhale slowly. You should be able to feel the hand on your abdomen moving in as you exhale.   Learn and use controlled coughing to clear mucus from your lungs. Controlled coughing is a series of short, progressive coughs. The steps of controlled coughing are:  1. Lean your head slightly forward.  2. Breathe in deeply using diaphragmatic breathing.  3. Try to hold your breath for 3 seconds.  4. Keep your mouth slightly open while coughing twice.  5. Spit any mucus out into a tissue.  6. Rest and repeat the steps once or twice as needed. SEEK MEDICAL CARE IF:   You are coughing up more mucus than usual.   There is a change in the color or thickness of your mucus.   Your breathing is more labored than usual.   Your breathing is faster than usual.  SEEK IMMEDIATE MEDICAL CARE IF:   You have shortness of breath while you are resting.   You have shortness of breath that prevents you from:  Being able to talk.   Performing your usual physical activities.   You have chest  pain lasting longer than 5 minutes.   Your skin color is more cyanotic than usual.  You measure low oxygen saturations for longer than 5 minutes with a pulse oximeter. MAKE SURE YOU:   Understand these instructions.  Will watch your condition.  Will get help right away if you are not doing well or get worse. Document Released: 08/03/2005 Document Revised: 08/14/2013 Document Reviewed: 06/20/2013 [facility] Patient Information 2015 [place], [place]. This information is not intended to replace advice given to you by your health care provider. Make sure you discuss any questions you have with your health care provider.

## 2014-05-06 NOTE — Discharge Summary (Signed)
I saw Ms. [name] [name] on day of discharge and assisted with the discharge planning.

## 2014-05-07 ENCOUNTER — Encounter: Payer: Self-pay | Admitting: Internal Medicine

## 2014-05-08 ENCOUNTER — Encounter: Payer: Self-pay | Admitting: Internal Medicine

## 2014-05-14 ENCOUNTER — Telehealth: Payer: Self-pay | Admitting: *Deleted

## 2014-05-14 ENCOUNTER — Encounter (HOSPITAL_COMMUNITY): Payer: Self-pay | Admitting: Emergency Medicine

## 2014-05-14 ENCOUNTER — Emergency Department (HOSPITAL_COMMUNITY)
Admission: EM | Admit: 2014-05-14 | Discharge: 2014-05-14 | Disposition: A | Discharge status: Home / Self Care | Payer: Medicare Other | Attending: Emergency Medicine | Admitting: Emergency Medicine

## 2014-05-14 ENCOUNTER — Emergency Department (HOSPITAL_COMMUNITY): Payer: Medicare Other

## 2014-05-14 DIAGNOSIS — Z87891 Personal history of nicotine dependence: Secondary | ICD-10-CM | POA: Insufficient documentation

## 2014-05-14 DIAGNOSIS — Z8719 Personal history of other diseases of the digestive system: Secondary | ICD-10-CM | POA: Insufficient documentation

## 2014-05-14 DIAGNOSIS — F411 Generalized anxiety disorder: Secondary | ICD-10-CM | POA: Insufficient documentation

## 2014-05-14 DIAGNOSIS — Y9389 Activity, other specified: Secondary | ICD-10-CM | POA: Insufficient documentation

## 2014-05-14 DIAGNOSIS — S9001XA Contusion of right ankle, initial encounter: Secondary | ICD-10-CM

## 2014-05-14 DIAGNOSIS — J4489 Other specified chronic obstructive pulmonary disease: Secondary | ICD-10-CM | POA: Insufficient documentation

## 2014-05-14 DIAGNOSIS — Z792 Long term (current) use of antibiotics: Secondary | ICD-10-CM | POA: Insufficient documentation

## 2014-05-14 DIAGNOSIS — Y929 Unspecified place or not applicable: Secondary | ICD-10-CM | POA: Insufficient documentation

## 2014-05-14 DIAGNOSIS — F3289 Other specified depressive episodes: Secondary | ICD-10-CM | POA: Insufficient documentation

## 2014-05-14 DIAGNOSIS — J449 Chronic obstructive pulmonary disease, unspecified: Secondary | ICD-10-CM | POA: Insufficient documentation

## 2014-05-14 DIAGNOSIS — Z79899 Other long term (current) drug therapy: Secondary | ICD-10-CM | POA: Insufficient documentation

## 2014-05-14 DIAGNOSIS — W208XXA Other cause of strike by thrown, projected or falling object, initial encounter: Secondary | ICD-10-CM | POA: Insufficient documentation

## 2014-05-14 DIAGNOSIS — IMO0002 Reserved for concepts with insufficient information to code with codable children: Secondary | ICD-10-CM | POA: Insufficient documentation

## 2014-05-14 DIAGNOSIS — Z9981 Dependence on supplemental oxygen: Secondary | ICD-10-CM | POA: Insufficient documentation

## 2014-05-14 DIAGNOSIS — E669 Obesity, unspecified: Secondary | ICD-10-CM | POA: Insufficient documentation

## 2014-05-14 DIAGNOSIS — Z8701 Personal history of pneumonia (recurrent): Secondary | ICD-10-CM | POA: Insufficient documentation

## 2014-05-14 DIAGNOSIS — F329 Major depressive disorder, single episode, unspecified: Secondary | ICD-10-CM | POA: Insufficient documentation

## 2014-05-14 DIAGNOSIS — S9000XA Contusion of unspecified ankle, initial encounter: Secondary | ICD-10-CM | POA: Insufficient documentation

## 2014-05-14 NOTE — Discharge Instructions (Signed)
1. Medications: Tylenol as needed for pain, usual home medications 2. Treatment: rest, drink plenty of fluids, use brace as needed 3. Follow Up: Please followup with your primary doctor in 1 week for discussion of your diagnoses and further evaluation after today's visit;     Contusion A contusion is a deep bruise. Contusions are the result of an injury that caused bleeding under the skin. The contusion may turn blue, purple, or yellow. Minor injuries will give you a painless contusion, but more severe contusions may stay painful and swollen for a few weeks.  CAUSES  A contusion is usually caused by a blow, trauma, or direct force to an area of the body. SYMPTOMS   Swelling and redness of the injured area.  Bruising of the injured area.  Tenderness and soreness of the injured area.  Pain. DIAGNOSIS  The diagnosis can be made by taking a history and physical exam. An X-ray, CT scan, or MRI may be needed to determine if there were any associated injuries, such as fractures. TREATMENT  Specific treatment will depend on what area of the body was injured. In general, the best treatment for a contusion is resting, icing, elevating, and applying cold compresses to the injured area. Over-the-counter medicines may also be recommended for pain control. Ask your caregiver what the best treatment is for your contusion. HOME CARE INSTRUCTIONS   Put ice on the injured area.  Put ice in a plastic bag.  Place a towel between your skin and the bag.  Leave the ice on for 15-20 minutes, 3-4 times a day, or as directed by your health care provider.  Only take over-the-counter or prescription medicines for pain, discomfort, or fever as directed by your caregiver. Your caregiver may recommend avoiding anti-inflammatory medicines (aspirin, ibuprofen, and naproxen) for 48 hours because these medicines may increase bruising.  Rest the injured area.  If possible, elevate the injured area to reduce  swelling. SEEK IMMEDIATE MEDICAL CARE IF:   You have increased bruising or swelling.  You have pain that is getting worse.  Your swelling or pain is not relieved with medicines. MAKE SURE YOU:   Understand these instructions.  Will watch your condition.  Will get help right away if you are not doing well or get worse. Document Released: 08/03/2005 Document Revised: 10/29/2013 Document Reviewed: 08/29/2011 [facility] Patient Information 2015 [place], [place]. This information is not intended to replace advice given to you by your health care provider. Make sure you discuss any questions you have with your health care provider.

## 2014-05-14 NOTE — ED Provider Notes (Signed)
Medical screening examination/treatment/procedure(s) were performed by non-physician practitioner and as supervising physician I was immediately available for consultation/collaboration.    Dot Lanes, MD 05/14/14 [phone]

## 2014-05-14 NOTE — Telephone Encounter (Signed)
Tammy with [facility] [phone] called to let clinic be aware. Pt has appt 05/15/14 8:45AM Dr Lynnae January. Still having problems with anxiety and crying. Breathing is better till pt gets anxious. Pt is getting counseling about every 3 months and getting limited med - Xanax? every 3 months. Left message on Tammy's voice mail that I got her message and will pass info on. If Tammy thought pt had to be seen today - can make appt - would not be with Dr Lynnae January. Lesa Vandall RN 05/14/14 2PM

## 2014-05-14 NOTE — Telephone Encounter (Signed)
I have never seen pt so I cannot help much. She cancelled her appt with me on the 2nd. I will be happy to see her tomorrow.

## 2014-05-14 NOTE — ED Notes (Signed)
Pain to right ankle since yesterday after hitting ankle with an air concentrator. Ambulatory in triage.

## 2014-05-14 NOTE — Progress Notes (Signed)
Orthopedic Tech Progress Note Patient Details:  [name] [name] November 22, 1955 [id number]  Ortho Devices Type of Ortho Device: ASO Ortho Device/Splint Location: rle Ortho Device/Splint Interventions: Application   Hildred Priest 05/14/2014, 6:34 PM

## 2014-05-14 NOTE — ED Provider Notes (Signed)
CSN: [id number]     Arrival date & time 05/14/14  1654 History  This patient was seen in room TR05C/TR05C and the patient's care was started at 6:16 PM.    Chief Complaint  Patient presents with  . Ankle Pain   Patient is a 58 y.o. female presenting with ankle pain. The history is provided by the patient and medical records. No language interpreter was used.  Ankle Pain Associated symptoms: no back pain, no fever and no neck pain    HPI Comments: [name] [name] is a 58 y.o. female who presents to the Emergency Department complaining of right ankle pain, onset yesterday morning after an air concentrate fell onto the foot.  Pt reports she is able to ambulate without difficulty.  Pain is worst over the lateral portion of the ankle. Pt reports using ice and ibuprofen with moderate relief.  Swelling has resolved, but pain persisted.  Pt denies numbness, tingling, weakness.     Past Medical History  Diagnosis Date  . COPD (chronic obstructive pulmonary disease)     last exacerbation 1 year ago, treated as outpatient  . Shortness of breath   . Recurrent upper respiratory infection (URI)   . GERD (gastroesophageal reflux disease)   . Insomnia   . Abdominal spasms     mid  . Depression   . Obesity   . Asthma   . Pneumonia     "once or twice"  . Chronic bronchitis     "get it alot" (04/30/2014)  . Sleep apnea     "couldn't afford mask so I didn't get it" (04/30/2014)  . Anxiety   . On home oxygen therapy     "3L at night only when I get sick" (04/30/2014)   Past Surgical History  Procedure Laterality Date  . Vaginal hysterectomy  ~ 1978  . Appendectomy  ~ 2002   Family History  Problem Relation Age of Onset  . Breast cancer Mother   . Breast cancer Maternal Grandmother   . Breast cancer Maternal Aunt   . Alcohol abuse Mother   . Emphysema Mother     smoked  . Asthma Mother    History  Substance Use Topics  . Smoking status: Former Smoker -- 2.00 packs/day for 44 years   Types: Cigarettes    Quit date: 11/07/2012  . Smokeless tobacco: Never Used  . Alcohol Use: No   OB History   Grav Para Term Preterm Abortions TAB SAB Ect Mult Living                 Review of Systems  Constitutional: Negative for fever and chills.  Gastrointestinal: Negative for nausea and vomiting.  Musculoskeletal: Positive for arthralgias and joint swelling. Negative for back pain, neck pain and neck stiffness.  Skin: Negative for wound.  Neurological: Negative for numbness.  Hematological: Does not bruise/bleed easily.  Psychiatric/Behavioral: The patient is not nervous/anxious.   All other systems reviewed and are negative.     Allergies  Ibuprofen  Home Medications   Prior to Admission medications   Medication Sig Start Date End Date Taking? Authorizing Provider  albuterol (PROVENTIL) (5 MG/ML) 0.5% nebulizer solution Take 2.5 mg by nebulization every 6 (six) hours as needed for wheezing or shortness of breath.    Historical Provider, MD  citalopram (CELEXA) 40 MG tablet Take 1 tablet (40 mg total) by mouth at bedtime. 03/05/14   Kathlee Nations, MD  guaiFENesin-codeine 100-10 MG/5ML syrup Take 5 mLs by  mouth every 4 (four) hours as needed for cough. 05/02/14   Corky Sox, MD  ipratropium (ATROVENT) 0.02 % nebulizer solution Take 0.5 mg by nebulization every 6 (six) hours as needed for wheezing or shortness of breath.    Historical Provider, MD  levofloxacin (LEVAQUIN) 750 MG tablet Take 1 tablet (750 mg total) by mouth daily. 05/02/14   Corky Sox, MD  mometasone-formoterol (DULERA) 100-5 MCG/ACT AERO Inhale 2 puffs into the lungs 2 (two) times daily. 05/02/14   Corky Sox, MD  predniSONE (DELTASONE) 20 MG tablet Take 2 tablets (40 mg total) by mouth daily with breakfast. 05/02/14   Corky Sox, MD  traZODone (DESYREL) 150 MG tablet Take 1 tablet (150 mg total) by mouth at bedtime. 03/05/14   Kathlee Nations, MD   Triage Vitals: BP 143/87  Pulse 101  Temp(Src) 97.6 F  (36.4 C) (Oral)  Resp 18  Wt 300 lb (136.079 kg)  SpO2 95%  Physical Exam  Nursing note and vitals reviewed. Constitutional: She appears well-developed and well-nourished. No distress.  HENT:  Head: Normocephalic and atraumatic.  Eyes: Conjunctivae are normal.  Neck: Normal range of motion.  Cardiovascular: Normal rate, regular rhythm, normal heart sounds and intact distal pulses.   Capillary refill < 3 sec  Pulmonary/Chest: Effort normal and breath sounds normal. No respiratory distress.  Musculoskeletal: She exhibits tenderness (right lateral malleolus). She exhibits no edema.  ROM: Full ROM with mild pain on flexion and extension.  MIld swelling and ecchymosis noted to the lateral malleolus.    Neurological: She is alert. Coordination normal.  Sensation intact to dull and sharp Strength 5/5 with resisted flexion and extension  Skin: Skin is warm and dry. She is not diaphoretic. No erythema.  No tenting of the skin  Psychiatric: She has a normal mood and affect.    ED Course  Procedures (including critical care time) DIAGNOSTIC STUDIES: Oxygen Saturation is 95% on RA, adequate by my interpretation.    COORDINATION OF CARE:    Labs Review Labs Reviewed - No data to display  Imaging Review Dg Ankle Complete Right  05/14/2014   CLINICAL DATA:  Pain post trauma  EXAM: RIGHT ANKLE - COMPLETE 3+ VIEW  COMPARISON:  None.  FINDINGS: Frontal, oblique, and lateral views were obtained. There is no fracture or effusion. Ankle mortise appears intact. There are small spurs arising from the posterior and inferior calcaneus.  IMPRESSION: Small calcaneal spurs.  No fracture.  Mortise intact.   Electronically Signed   By: Lowella Grip M.D.   On: 05/14/2014 17:33     EKG Interpretation None      MDM   Final diagnoses:  Ankle contusion, right, initial encounter    [name] [name] presents with right ankle pain and swelling.  Patient X-Ray negative for obvious fracture or  dislocation. Pain managed in ED. Pt advised to follow up with orthopedics or PCP in 1 week if symptoms persist for possibility of missed fracture diagnosis. Patient given brace while in ED, conservative therapy recommended and discussed. Patient will be dc home & is agreeable with above plan.   I personally reviewed the imaging tests through PACS system  I reviewed available ER/hospitalization records through the EMR  BP 143/87  Pulse 101  Temp(Src) 97.6 F (36.4 C) (Oral)  Resp 18  Wt 300 lb (136.079 kg)  SpO2 95%   Abigail Butts, PA-C 05/14/14 1816

## 2014-05-15 ENCOUNTER — Ambulatory Visit (INDEPENDENT_AMBULATORY_CARE_PROVIDER_SITE_OTHER): Payer: Medicare Other | Admitting: Internal Medicine

## 2014-05-15 ENCOUNTER — Telehealth: Payer: Self-pay | Admitting: *Deleted

## 2014-05-15 ENCOUNTER — Encounter: Payer: Self-pay | Admitting: Licensed Clinical Social Worker

## 2014-05-15 ENCOUNTER — Encounter: Payer: Self-pay | Admitting: Internal Medicine

## 2014-05-15 VITALS — BP 128/80 | HR 80 | Temp 97.1°F | Wt 304.1 lb

## 2014-05-15 DIAGNOSIS — F410 Panic disorder [episodic paroxysmal anxiety] without agoraphobia: Secondary | ICD-10-CM

## 2014-05-15 DIAGNOSIS — F32A Depression, unspecified: Secondary | ICD-10-CM

## 2014-05-15 DIAGNOSIS — E785 Hyperlipidemia, unspecified: Secondary | ICD-10-CM

## 2014-05-15 DIAGNOSIS — J45909 Unspecified asthma, uncomplicated: Secondary | ICD-10-CM

## 2014-05-15 DIAGNOSIS — F329 Major depressive disorder, single episode, unspecified: Secondary | ICD-10-CM

## 2014-05-15 DIAGNOSIS — F341 Dysthymic disorder: Secondary | ICD-10-CM

## 2014-05-15 MED ORDER — ALPRAZOLAM 0.5 MG PO TABS: ORAL_TABLET | ORAL | Status: DC

## 2014-05-15 MED ORDER — FLUTICASONE-SALMETEROL 250-50 MCG/DOSE IN AEPB: 1.0000 | INHALATION_SPRAY | Freq: Two times a day (BID) | RESPIRATORY_TRACT | Status: DC

## 2014-05-15 MED ORDER — ESCITALOPRAM OXALATE 10 MG PO TABS: 10.0000 mg | ORAL_TABLET | Freq: Every day | ORAL | Status: DC

## 2014-05-15 NOTE — Assessment & Plan Note (Signed)
See depression A/P. 

## 2014-05-15 NOTE — Patient Instructions (Signed)
1. I sent in Xanax to your pharmacy. 20 will last you over one month 2. I sent in Advair to replace Dulera - let me know if too costly 3. Edwena Blow and I will work on new psychiatrist and new med (instead of Celexa) will depend on how quickly I can get you a new appt 4. See me in 2-3 week 5. Call 911 if you feel like hurting yourself or others
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
## 2014-05-15 NOTE — Telephone Encounter (Signed)
Call from pt - Xanax was not called to her pharmacy; she was seen today.  Xanax 0.5mg  rx called to [place].

## 2014-05-15 NOTE — Assessment & Plan Note (Addendum)
Reviewed Dr Gustavus Bryant notes and PFT's and she does not have COPD so I removed CPD from her PMHx and PL. She is using her ALb and Atrovent nebs. Was not able to afford [facility] - $80 copay. Will change to Advair and see if copay is less.

## 2014-05-15 NOTE — Progress Notes (Signed)
   Subjective:    Patient ID: [name] [name], female    DOB: Jul 19, 1956, 58 y.o.   MRN: [id number]  Anxiety Symptoms include nervous/anxious behavior. Patient reports no suicidal ideas.      This is my first visit with [name] [name] and we were unable to cover her chronic medical issues as she is dealing with uncontrolled Depression and anxiety.   Review of Systems  Constitutional: Positive for activity change. Negative for appetite change.  Cardiovascular:       Heart racing  Psychiatric/Behavioral: Positive for behavioral problems, sleep disturbance and agitation. Negative for suicidal ideas and self-injury. The patient is nervous/anxious.        Objective:   Physical Exam  Constitutional: She appears well-developed and well-nourished. No distress.  HENT:  Head: Normocephalic and atraumatic.  Right Ear: External ear normal.  Left Ear: External ear normal.  Nose: Nose normal.  Eyes: Conjunctivae and EOM are normal.  Skin: She is not diaphoretic.  Psychiatric: Her behavior is normal. Judgment normal. Her affect is not angry and not inappropriate. Her speech is not rapid and/or pressured. Thought content is not paranoid. Cognition and memory are normal. She exhibits a depressed mood. She expresses no homicidal and no suicidal ideation. She expresses no suicidal plans and no homicidal plans.  Tearful but very good insight. Would not commit suicide / homicide bc of her Christian faith.          Assessment & Plan:

## 2014-05-15 NOTE — Progress Notes (Signed)
[name] [name] was referred to CSW as pt would like referral to new psychiatry.  Pt currently not satisfied.  CSW met with [name] [name] to determine if pt would like to change providers within [facility] [employer] or new agency. Pt states she would like to change agencies and would like a female provider.  Utilizing pt's insurance provider directory, pt referred to [facility].  Accepting new patients for both therapy and med management.  [name] [name] in agreement and referral placed.  Pt scheduled for [facility] on 05/22/14 with follow up appointment with psychiatry the following week.  Pt informed [facility], must keep appointment with counseling and psychiatry will follow.  CSW provided [name] [name] with information on referral.

## 2014-05-23 ENCOUNTER — Encounter: Payer: Self-pay | Admitting: Internal Medicine

## 2014-05-23 ENCOUNTER — Ambulatory Visit (INDEPENDENT_AMBULATORY_CARE_PROVIDER_SITE_OTHER): Payer: Medicare Other | Admitting: Internal Medicine

## 2014-05-23 VITALS — BP 132/85 | HR 89 | Temp 97.4°F | Wt 311.2 lb

## 2014-05-23 DIAGNOSIS — F3289 Other specified depressive episodes: Secondary | ICD-10-CM

## 2014-05-23 DIAGNOSIS — J4520 Mild intermittent asthma, uncomplicated: Secondary | ICD-10-CM

## 2014-05-23 DIAGNOSIS — F329 Major depressive disorder, single episode, unspecified: Secondary | ICD-10-CM

## 2014-05-23 DIAGNOSIS — J45909 Unspecified asthma, uncomplicated: Secondary | ICD-10-CM

## 2014-05-23 DIAGNOSIS — F32A Depression, unspecified: Secondary | ICD-10-CM

## 2014-05-23 DIAGNOSIS — G4733 Obstructive sleep apnea (adult) (pediatric): Secondary | ICD-10-CM

## 2014-05-23 DIAGNOSIS — M7989 Other specified soft tissue disorders: Secondary | ICD-10-CM

## 2014-05-23 LAB — COMPLETE METABOLIC PANEL WITH GFR
ALBUMIN: 3.5 g/dL (ref 3.5–5.2)
ALT: 9 U/L (ref 0–35)
AST: 9 U/L (ref 0–37)
Alkaline Phosphatase: 112 U/L (ref 39–117)
BILIRUBIN TOTAL: 0.4 mg/dL (ref 0.2–1.2)
BUN: 10 mg/dL (ref 6–23)
CO2: 27 mEq/L (ref 19–32)
Calcium: 8.9 mg/dL (ref 8.4–10.5)
Chloride: 104 mEq/L (ref 96–112)
Creat: 0.55 mg/dL (ref 0.50–1.10)
GFR, Est African American: >89 mL/min
Glucose, Bld: 93 mg/dL (ref 70–99)
POTASSIUM: 4.3 meq/L (ref 3.5–5.3)
Sodium: 139 mEq/L (ref 135–145)
Total Protein: 6.1 g/dL (ref 6.0–8.3)

## 2014-05-23 LAB — POCT URINALYSIS DIPSTICK
BILIRUBIN UA: NEGATIVE
Blood, UA: NEGATIVE
GLUCOSE UA: NEGATIVE
KETONES UA: NEGATIVE
NITRITE UA: NEGATIVE
PH UA: 7
Protein, UA: NEGATIVE
Spec Grav, UA: 1.015
Urobilinogen, UA: 0.2

## 2014-05-23 LAB — POCT GLYCOSYLATED HEMOGLOBIN (HGB A1C): HEMOGLOBIN A1C: 5.9

## 2014-05-23 LAB — TSH: TSH: 3.31 u[IU]/mL (ref 0.350–4.500)

## 2014-05-23 MED ORDER — FUROSEMIDE 20 MG PO TABS: 20.0000 mg | ORAL_TABLET | Freq: Every day | ORAL | Status: DC | PRN

## 2014-05-23 NOTE — Assessment & Plan Note (Addendum)
Patient has swelling of right lateral malleolus due to soft tissue injury from dropping her oxygen concentrator on her leg.  In addition, she has pretibial swelling that does not appear to be associated in her right leg and trace edema in her left leg.  She is currently having pain associated with the swelling and says her feet feel cold, despite good perfusion to both feet.  Some of the swelling may be related to her obesity and chronic venous insufficiency.  However, she has had elevated liver enzymes in the past and is at risk for diabetes, which could be contributing to the bilateral nerve-like pain she is experiencing in her feet.  She denies urinary or hypothyroid symptoms. -CMP to check kidney and urine function. -Urine dipstick for protein -TSH -A1C -Consider a dose of diuretics if labs negative.  --Addendum-- Arman Filter, MD, PhD Internal Medicine Intern Pager: [phone] 05/23/2014,4:38 PM  CMP, TSH, A1C, urine dipstick normal. -Furosemide 20 mg daily PRN. -Called patient and left message asking her to pick up at pharmacy and take one pill this weekend. -Dr. Lynnae January will call on Monday to see how she is doing.

## 2014-05-23 NOTE — Progress Notes (Signed)
I saw and evaluated the patient.  I personally confirmed the key portions of the history and exam documented by Dr. Trudee Kuster and I reviewed pertinent patient test results.  The assessment, diagnosis, and plan were formulated together and I agree with the documentation in the resident's note. I will cancel pt's next week F/U appt with me (pt in agreement) as we were able to talk about her depression and anxiety today. F/I with me one month.

## 2014-05-23 NOTE — Addendum Note (Signed)
Addended by: Charlesetta Shanks on: 05/23/2014 04:42 PM   Modules accepted: Orders

## 2014-05-23 NOTE — Progress Notes (Signed)
Subjective:    Patient ID: [name] [name], female    DOB: [birth year], 58 y.o.   MRN: [id number]  HPI Comments: She reports dropping her oxygen concentrator on her right foot after getting out of the hospital two weeks ago.  She went to the ER, and they took X-rays that were negative.  Since then, she has continued to have swelling and pain in her right foot.  In addition, she reports swelling in her left foot as well.  She has had pain in her left foot but not to the same extent.  She reports mild swelling in her feet in the past, but not as bad as today.  She has been taking Tylenol 1000 mg BID for pain because she can't take ibuprofen, but it hasn't been helping much.  She also tried some of her husbands Tylenol with codeine, and it helped a little bit.  She has a history of lung disease and takes albuterol and Dulera.  Today, she reports breathing okay.  She usually uses 3L of oxygen at home and runs at 93% O2 at home.  She denies chest pain, palpitations, fevers, or chills.  She reports improvement of her anxiety, since we last saw her.  She went to [facility] for counseling yesterday, and she thinks it helped.  She has been much less teary, and has been taking Xanax less than once per day.  Foot Pain Associated symptoms include arthralgias, coughing, joint swelling and myalgias. Pertinent negatives include no chest pain, chills, congestion, diaphoresis, fatigue, fever, headaches, nausea, numbness, rash, sore throat or vomiting.      Review of Systems  Constitutional: Positive for appetite change. Negative for fever, chills, diaphoresis, activity change and fatigue.  HENT: Negative for congestion and sore throat.   Eyes: Negative.   Respiratory: Positive for cough. Negative for apnea, choking, chest tightness and shortness of breath.   Cardiovascular: Positive for leg swelling. Negative for chest pain and palpitations.  Gastrointestinal: Negative for nausea, vomiting,  diarrhea, constipation and abdominal distention.  Genitourinary: Negative for dysuria and difficulty urinating.  Musculoskeletal: Positive for arthralgias, joint swelling and myalgias.  Skin: Negative for rash.  Neurological: Negative for dizziness, numbness and headaches.  Psychiatric/Behavioral: Negative for agitation. The patient is not nervous/anxious.        Objective:   Physical Exam  Constitutional: She is oriented to person, place, and time. She appears well-developed and well-nourished. No distress.  HENT:  Head: Normocephalic and atraumatic.  Mouth/Throat: Oropharynx is clear and moist.  Eyes: Conjunctivae and EOM are normal. Pupils are equal, round, and reactive to light. No scleral icterus.  Neck: Normal range of motion. Neck supple.  Cardiovascular: Normal rate, regular rhythm, normal heart sounds and intact distal pulses.  Exam reveals no gallop and no friction rub.   No murmur heard. Pulmonary/Chest: Effort normal and breath sounds normal. No respiratory distress. She has no wheezes. She has no rales. She exhibits no tenderness.  Exam limited by body habitus.  Abdominal: Soft. Bowel sounds are normal. She exhibits no distension. There is no tenderness.  Musculoskeletal: Normal range of motion. She exhibits edema (1+ pitting edema in right lower extemity to knee, trace edema in left lower extemity.) and tenderness (Over right lateral malleolus with small scab and associated swelling.).  Neurological: She is alert and oriented to person, place, and time. No cranial nerve deficit. Coordination normal.  Skin: Skin is warm and dry. No rash noted. She is not diaphoretic.  Assessment & Plan:  Please see problem-based assessment and plan.

## 2014-05-23 NOTE — Assessment & Plan Note (Addendum)
She had a sleep study done a year ago, and they recommended a CPAP machine. However, she couldn't afford the machine.  This could be contributing to her lower extremity swelling, and treatment may improve her energy level and quality of life. -Discussed trying to get CPAP machine again. -Follow up at next visit.

## 2014-05-23 NOTE — Patient Instructions (Signed)
Thank you for coming to clinic today Ms. [name] [name].  General instructions: -We are going to check some blood work and check your urine to figure out what is causing your swelling.   -We will call you tomorrow or Monday with the results. -If the tests come back negative, we may try a diuretic to help reduce the swelling in your feet. -Continue to use Tylenol for now to help with the pain -Please make a follow up appointment to return to clinic in one month. -Let us know if the swelling gets worse before then.  Please bring your medicines with you each time you come.   Medicines may be  Eye drops  Herbal   Vitamins  Pills  Seeing these help [place] take care of you.

## 2014-05-23 NOTE — Assessment & Plan Note (Addendum)
Diagnosed with asthma with restrictive component by Pulmonary clinic due to weight based on pulmonary function tests from 12/12/12.  Using oxygen at home with O2 sats around 93% on 3L.  91% on RA in clinic today.  The reason for her oxygen requirement is unclear, but it may be related to being overweight and poor ventilation.  She reports no breathing symptoms today and has not had to use her nebulizer or albuterol inhaler recently. -Continue Dulera 100-5 mcg inhaler 2 puffs, 2 times daily. -Continue albuterol inhaler and ipratropium nebulizer PRN. -Assess oxygen need at next visit.

## 2014-05-23 NOTE — Assessment & Plan Note (Signed)
Patient reports significant improvement of her depression and anxiety symptoms since last visit, and thinks it has helped to see a Social worker.  Using Xanax less than once daily. -Continue Lexapro 10 mg daily. -Continue trazodone 150 mg at bedtime. -Continue Xanax 0.5 mg PRN. -Follow up in 1 month.

## 2014-05-26 ENCOUNTER — Telehealth: Payer: Self-pay | Admitting: Internal Medicine

## 2014-05-26 NOTE — Telephone Encounter (Signed)
Phoned and got pt at 2:15. All of the swelling is gone in both feet. All of the pain is gone inc the R foot. No side effects from the lasix. Pt instructed to use the lasix PRN.

## 2014-06-02 ENCOUNTER — Other Ambulatory Visit (HOSPITAL_COMMUNITY): Payer: Self-pay | Admitting: Psychiatry

## 2014-06-05 ENCOUNTER — Ambulatory Visit: Payer: Self-pay | Admitting: Internal Medicine

## 2014-06-05 ENCOUNTER — Ambulatory Visit (HOSPITAL_COMMUNITY): Payer: Self-pay | Admitting: Psychiatry

## 2014-06-06 ENCOUNTER — Other Ambulatory Visit (HOSPITAL_COMMUNITY): Payer: Self-pay | Admitting: Psychiatry

## 2014-06-12 ENCOUNTER — Other Ambulatory Visit: Payer: Self-pay | Admitting: Internal Medicine

## 2014-06-13 NOTE — Telephone Encounter (Signed)
Pls make FU appt with me for depression

## 2014-06-17 ENCOUNTER — Encounter: Payer: Self-pay | Admitting: Internal Medicine

## 2014-06-19 ENCOUNTER — Other Ambulatory Visit: Payer: Self-pay | Admitting: Internal Medicine

## 2014-06-20 NOTE — Telephone Encounter (Signed)
Seems to be taking Qd. Will refill. Has F/U Appt this month

## 2014-06-24 ENCOUNTER — Ambulatory Visit: Payer: Self-pay | Admitting: Internal Medicine

## 2014-07-01 ENCOUNTER — Encounter: Payer: Self-pay | Admitting: Internal Medicine

## 2014-07-01 ENCOUNTER — Ambulatory Visit (INDEPENDENT_AMBULATORY_CARE_PROVIDER_SITE_OTHER): Payer: Medicare Other | Admitting: Internal Medicine

## 2014-07-01 VITALS — BP 113/81 | HR 87 | Temp 97.8°F | Ht 68.0 in | Wt 306.7 lb

## 2014-07-01 DIAGNOSIS — M7989 Other specified soft tissue disorders: Secondary | ICD-10-CM

## 2014-07-01 DIAGNOSIS — F32A Depression, unspecified: Secondary | ICD-10-CM

## 2014-07-01 DIAGNOSIS — J45909 Unspecified asthma, uncomplicated: Secondary | ICD-10-CM

## 2014-07-01 DIAGNOSIS — F329 Major depressive disorder, single episode, unspecified: Secondary | ICD-10-CM

## 2014-07-01 DIAGNOSIS — F3289 Other specified depressive episodes: Secondary | ICD-10-CM

## 2014-07-01 DIAGNOSIS — Z1231 Encounter for screening mammogram for malignant neoplasm of breast: Secondary | ICD-10-CM

## 2014-07-01 DIAGNOSIS — G4733 Obstructive sleep apnea (adult) (pediatric): Secondary | ICD-10-CM

## 2014-07-01 DIAGNOSIS — Z0000 Encounter for general adult medical examination without abnormal findings: Secondary | ICD-10-CM | POA: Insufficient documentation

## 2014-07-01 DIAGNOSIS — Z1211 Encounter for screening for malignant neoplasm of colon: Secondary | ICD-10-CM

## 2014-07-01 MED ORDER — ALBUTEROL SULFATE HFA 108 (90 BASE) MCG/ACT IN AERS: 1.0000 | INHALATION_SPRAY | Freq: Four times a day (QID) | RESPIRATORY_TRACT | Status: DC | PRN

## 2014-07-01 NOTE — Assessment & Plan Note (Addendum)
Pt reports SOB with ambulation only able to walk 1/2 mile.  Uses O2 (3L) mainly at night.  Endorses PND and orthopnea having to sleep on at least 2 pillows in addition to having to sleep on her side because she cannot lie flat.  Also reports chronic productive cough worse in the mornings.  Endorses occasional wheezing.  Reports using dulera twice daily.  Is not using her nebulizer machine (only uses when she "gets sick") and uses dulera as a rescue inhaler when she gets SOB.  Stated she did not have an albuterol inhaler.  Last TTE completed in 2013 with normal LVEF and no signs of RVH or pulmonary HTN.  Denies LE swelling.  On physical exam, she had diffuse expiratory wheezing without crackles or rhonchi.  No LE pitting edema.  O2 sat 92% on RA.   -refilled albuterol inhaler and instructed pt to use this and not dulera when she has wheezing/SOB -consider repeat TTE

## 2014-07-01 NOTE — Assessment & Plan Note (Addendum)
-  discussed referral to [facility], CDE for health and lifestyle coaching but she is not interested -encouraged weight loss and pt states "I know what to do" -at next visit could discuss possibility of future bariatric surgery to see if she's interested

## 2014-07-01 NOTE — Assessment & Plan Note (Signed)
Pt reports being unable to afford CPAP. -encouraged weight loss  -offered referral to [facility] for health coaching but she declined -could consider an oral appliance

## 2014-07-01 NOTE — Assessment & Plan Note (Signed)
-  referral for colonoscopy -screening mammogram ordered
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
## 2014-07-01 NOTE — Assessment & Plan Note (Signed)
-  swelling resolved; no longer using furosemide

## 2014-07-01 NOTE — Addendum Note (Signed)
Addended by: Jones Bales on: 07/01/2014 10:51 PM   Modules accepted: Level of Service

## 2014-07-01 NOTE — Assessment & Plan Note (Addendum)
Pt is followed by [facility] and changed medication to zoloft. -continue to follow with [facility]  -continue zoloft

## 2014-07-01 NOTE — Patient Instructions (Signed)
Thank you for your visit today.   Please return to the internal medicine clinic in to see Dr. Lynnae January as needed.      Your current medical regimen is effective;  continue present plan and take all medications as prescribed.    I have made the following additions/changes to your medications:  I have added albuterol inhaler to be used for shortness of breath every 6 hours as needed; please do not use dulera when you get short of breath-->continue to use dulera twice daily but use your rescue inhaler, albuterol, when you get short of breath  Also, please use your oxygen at night-->I would also recommend a CPAP.   I have made the following referrals for you: GI for repeat colonoscopy and mammogram.   Thank you for bringing your medications.  Please be sure to bring all of your medications with you to every visit; this includes herbal supplements, vitamins, eye drops, and any over-the-counter medications.   Should you have any questions regarding your medications and/or any new or worsening symptoms, please be sure to call the clinic at [phone].   If you believe that you are suffering from a life threatening condition or one that may result in the loss of limb or function, then you should call 911 or proceed to the nearest Emergency Department.     A healthy lifestyle and preventative care can promote health and wellness.   Maintain regular health, dental, and eye exams.  Eat a healthy diet. Foods like vegetables, fruits, whole grains, low-fat dairy products, and lean protein foods contain the nutrients you need without too many calories. Decrease your intake of foods high in solid fats, added sugars, and salt. Get information about a proper diet from your caregiver, if necessary.  Regular physical exercise is one of the most important things you can do for your health. Most adults should get at least 150 minutes of moderate-intensity exercise (any activity that increases your heart rate  and causes you to sweat) each week. In addition, most adults need muscle-strengthening exercises on 2 or more days a week.   Maintain a healthy weight. The body mass index (BMI) is a screening tool to identify possible weight problems. It provides an estimate of body fat based on height and weight. Your caregiver can help determine your BMI, and can help you achieve or maintain a healthy weight. For adults 20 years and older:  A BMI below 18.5 is considered underweight.  A BMI of 18.5 to 24.9 is normal.  A BMI of 25 to 29.9 is considered overweight.  A BMI of 30 and above is considered obese.

## 2014-07-02 NOTE — Progress Notes (Signed)
Case discussed with Dr. Gill soon after the resident saw the patient.  We reviewed the resident's history and exam and pertinent patient test results.  I agree with the assessment, diagnosis and plan of care documented in the resident's note. 

## 2014-07-16 ENCOUNTER — Encounter: Payer: Self-pay | Admitting: *Deleted

## 2014-07-17 ENCOUNTER — Ambulatory Visit
Admission: RE | Admit: 2014-07-17 | Discharge: 2014-07-17 | Disposition: A | Discharge status: Home / Self Care | Payer: Medicare Other | Source: Ambulatory Visit | Attending: Internal Medicine | Admitting: Internal Medicine

## 2014-07-17 DIAGNOSIS — Z1231 Encounter for screening mammogram for malignant neoplasm of breast: Secondary | ICD-10-CM

## 2014-07-28 ENCOUNTER — Emergency Department (HOSPITAL_COMMUNITY)
Admission: EM | Admit: 2014-07-28 | Discharge: 2014-07-28 | Disposition: A | Discharge status: Home / Self Care | Payer: Medicare Other | Attending: Emergency Medicine | Admitting: Emergency Medicine

## 2014-07-28 ENCOUNTER — Encounter (HOSPITAL_COMMUNITY): Payer: Self-pay | Admitting: Emergency Medicine

## 2014-07-28 DIAGNOSIS — F411 Generalized anxiety disorder: Secondary | ICD-10-CM | POA: Diagnosis not present

## 2014-07-28 DIAGNOSIS — Z87891 Personal history of nicotine dependence: Secondary | ICD-10-CM | POA: Diagnosis not present

## 2014-07-28 DIAGNOSIS — F3289 Other specified depressive episodes: Secondary | ICD-10-CM | POA: Insufficient documentation

## 2014-07-28 DIAGNOSIS — J45909 Unspecified asthma, uncomplicated: Secondary | ICD-10-CM | POA: Diagnosis not present

## 2014-07-28 DIAGNOSIS — E669 Obesity, unspecified: Secondary | ICD-10-CM | POA: Diagnosis not present

## 2014-07-28 DIAGNOSIS — Z9981 Dependence on supplemental oxygen: Secondary | ICD-10-CM | POA: Diagnosis not present

## 2014-07-28 DIAGNOSIS — L988 Other specified disorders of the skin and subcutaneous tissue: Secondary | ICD-10-CM | POA: Insufficient documentation

## 2014-07-28 DIAGNOSIS — F329 Major depressive disorder, single episode, unspecified: Secondary | ICD-10-CM | POA: Diagnosis not present

## 2014-07-28 DIAGNOSIS — Z79899 Other long term (current) drug therapy: Secondary | ICD-10-CM | POA: Insufficient documentation

## 2014-07-28 DIAGNOSIS — A630 Anogenital (venereal) warts: Secondary | ICD-10-CM | POA: Diagnosis not present

## 2014-07-28 DIAGNOSIS — R238 Other skin changes: Secondary | ICD-10-CM

## 2014-07-28 DIAGNOSIS — Z8719 Personal history of other diseases of the digestive system: Secondary | ICD-10-CM | POA: Insufficient documentation

## 2014-07-28 LAB — URINALYSIS, ROUTINE W REFLEX MICROSCOPIC
Bilirubin Urine: NEGATIVE
Glucose, UA: NEGATIVE mg/dL
Hgb urine dipstick: NEGATIVE
Ketones, ur: NEGATIVE mg/dL
NITRITE: NEGATIVE
Protein, ur: NEGATIVE mg/dL
Specific Gravity, Urine: 1.01 (ref 1.005–1.030)
UROBILINOGEN UA: 1 mg/dL (ref 0.0–1.0)
pH: 5 (ref 5.0–8.0)

## 2014-07-28 LAB — URINE MICROSCOPIC-ADD ON

## 2014-07-28 MED ORDER — OXYCODONE-ACETAMINOPHEN 5-325 MG PO TABS
1.0000 | ORAL_TABLET | Freq: Once | ORAL | Status: AC
  Administered 2014-07-28: 1 via ORAL
  Filled 2014-07-28: qty 1

## 2014-07-28 NOTE — Discharge Instructions (Signed)
Please call your doctor for a followup appointment within 24-48 hours. When you talk to your doctor please let them know that you were seen in the emergency department and have them acquire all of your records so that they can discuss the findings with you and formulate a treatment plan to fully care for your new and ongoing problems. Please call and set-up an appointment with your primary care provider and dermatologist Please discontinue gel for this is causing dried skin and skin irritation  Please apply cool skin compressions  Please continue to monitor symptoms closely and if symptoms are to worsen or change (fever greater than 101, chills, sweating, nausea, vomiting, chest pain, shortness of breathe, difficulty breathing, weakness, numbness, tingling, worsening or changes to pain pattern, red streaks, drainage, bleeding, changes to appearance of the lesions) please report back to the Emergency Department immediately.   Genital Warts Genital warts are a sexually transmitted infection. They may appear as small bumps on the tissues of the genital area. CAUSES  Genital warts are caused by a virus called human papillomavirus (HPV). HPV is the most common sexually transmitted disease (STD) and infection of the sex organs. This infection is spread by having unprotected sex with an infected person. It can be spread by vaginal, anal, and oral sex. Many people do not know they are infected. They may be infected for years without problems. However, even if they do not have problems, they can unknowingly pass the infection to their sexual partners. SYMPTOMS   Itching and irritation in the genital area.  Warts that bleed.  Painful sexual intercourse. DIAGNOSIS  Warts are usually recognized with the naked eye on the vagina, vulva, perineum, anus, and rectum. Certain tests can also diagnose genital warts, such as:  A Pap test.  A tissue sample (biopsy) exam.  Colposcopy. A magnifying tool is used to  examine the vagina and cervix. The HPV cells will change color when certain solutions are used. TREATMENT  Warts can be removed by:  Applying certain chemicals, such as cantharidin or podophyllin.  Liquid nitrogen freezing (cryotherapy).  Immunotherapy with Candida or Trichophyton injections.  Laser treatment.  Burning with an electrified probe (electrocautery).  Interferon injections.  Surgery. PREVENTION  HPV vaccination can help prevent HPV infections that cause genital warts and that cause cancer of the cervix. It is recommended that the vaccination be given to people between the ages 10 to 61 years old. The vaccine might not work as well or might not work at all if you already have HPV. It should not be given to pregnant women. HOME CARE INSTRUCTIONS   It is important to follow your caregiver's instructions. The warts will not go away without treatment. Repeat treatments are often needed to get rid of warts. Even after it appears that the warts are gone, the normal tissue underneath often remains infected.  Do not try to treat genital warts with medicine used to treat hand warts. This type of medicine is strong and can burn the skin in the genital area, causing more damage.  Tell your past and current sexual partner(s) that you have genital warts. They may be infected also and need treatment.  Avoid sexual contact while being treated.  Do not touch or scratch the warts. The infection may spread to other parts of your body.  Women with genital warts should have a cervical cancer check (Pap test) at least once a year. This type of cancer is slow-growing and can be cured if found early.  Chances of developing cervical cancer are increased with HPV.  Inform your obstetrician about your warts in the event of pregnancy. This virus can be passed to the baby's respiratory tract. Discuss this with your caregiver.  Use a condom during sexual intercourse. Following treatment, the use of  condoms will help prevent reinfection.  Ask your caregiver about using over-the-counter anti-itch creams. SEEK MEDICAL CARE IF:   Your treated skin becomes red, swollen, or painful.  You have a fever.  You feel generally ill.  You feel little lumps in and around your genital area.  You are bleeding or have painful sexual intercourse. MAKE SURE YOU:   Understand these instructions.  Will watch your condition.  Will get help right away if you are not doing well or get worse. Document Released: 10/21/2000 Document Revised: 03/10/2014 Document Reviewed: 05/02/2011 [facility] Patient Information 2015 [place], [place]. This information is not intended to replace advice given to you by your health care provider. Make sure you discuss any questions you have with your health care provider.

## 2014-07-28 NOTE — ED Provider Notes (Signed)
CSN: [id number]     Arrival date & time 07/28/14  1249 History   First MD Initiated Contact with Patient 07/28/14 1802     Chief Complaint  Patient presents with  . Genital Warts     (Consider location/radiation/quality/duration/timing/severity/associated sxs/prior Treatment) The history is provided by the patient. No language interpreter was used.  [name] [name] is a 58 y/o F with PMHx of GERD, insomnia, depression, obesity, asthma presenting to the ED with irritation around the site of her genital warts. Patient reported that she noticed these lesions approximately 2 months ago and was seen by a dermatologist approximately 2 weeks and was started on Condylox 0.5% gel. Patient reported that she has been applying the medication topically as prescribed and stated that she has been having irritation to the skin. Reported that there is burning every time she places the gel onto the skin in the area of her genital warts. Reported that the genital warts have not gotten any better - reported that they have remained the same size. Patient stated that walking and sitting makes the pain worse. Stated that she has history of genital warts - reported that she had an outbreak many years ago when the lesions were frozen off. Denied bleeding, discharge, drainage, vaginal bleeding, vaginal discharge, fever, chills, spreading.  PCP Dr. Lynnae January Dermatologist Aquilla Solian, PA-C  Past Medical History  Diagnosis Date  . GERD (gastroesophageal reflux disease)   . Insomnia   . Depression   . Obesity   . Asthma     Chronic, with restrictive component 2/2 obesity  . Sleep apnea     "couldn't afford mask so I didn't get it" (04/30/2014)  . Anxiety   . On home oxygen therapy     "3L at night only when I get sick" (04/30/2014)   Past Surgical History  Procedure Laterality Date  . Vaginal hysterectomy  ~ 1978  . Appendectomy  ~ 2002   Family History  Problem Relation Age of Onset  . Breast cancer Mother     . Breast cancer Maternal Grandmother   . Breast cancer Maternal Aunt   . Alcohol abuse Mother   . Emphysema Mother     smoked  . Asthma Mother    History  Substance Use Topics  . Smoking status: Former Smoker -- 2.00 packs/day for 44 years    Types: Cigarettes    Quit date: 11/07/2012  . Smokeless tobacco: Never Used  . Alcohol Use: No   OB History   Grav Para Term Preterm Abortions TAB SAB Ect Mult Living                 Review of Systems  Constitutional: Negative for fever and chills.  Respiratory: Negative for chest tightness and shortness of breath.   Cardiovascular: Negative for chest pain.  Gastrointestinal: Negative for abdominal pain.  Genitourinary: Negative for dysuria, vaginal bleeding, vaginal discharge, vaginal pain and pelvic pain.  Skin: Positive for color change and rash.      Allergies  Ibuprofen  Home Medications   Prior to Admission medications   Medication Sig Start Date End Date Taking? Authorizing Provider  albuterol (PROVENTIL HFA;VENTOLIN HFA) 108 (90 BASE) MCG/ACT inhaler Inhale 1-2 puffs into the lungs every 6 (six) hours as needed for wheezing or shortness of breath (cough). 07/01/14   Jones Bales, MD  albuterol (PROVENTIL) (5 MG/ML) 0.5% nebulizer solution Take 2.5 mg by nebulization every 6 (six) hours as needed for wheezing or shortness of  breath.    Historical Provider, MD  ALPRAZolam Duanne Moron) 0.5 MG tablet Take 1 tab for severe anxiety attack. #20 equals one month supply 05/15/14   Bartholomew Crews, MD  gabapentin (NEURONTIN) 300 MG capsule Take 600 mg by mouth at bedtime.    Historical Provider, MD  ipratropium (ATROVENT) 0.02 % nebulizer solution Take 0.5 mg by nebulization every 6 (six) hours as needed for wheezing or shortness of breath.    Historical Provider, MD  mometasone-formoterol (DULERA) 100-5 MCG/ACT AERO Inhale 2 puffs into the lungs 2 (two) times daily. 05/02/14   Corky Sox, MD  sertraline (ZOLOFT) 100 MG tablet Take  100 mg by mouth every morning.    Historical Provider, MD   BP 117/79  Pulse 83  Temp(Src) 97.7 F (36.5 C) (Oral)  Resp 17  Ht 5\' 8"  (1.727 m)  Wt 304 lb 7 oz (138.092 kg)  BMI 46.30 kg/m2  SpO2 99% Physical Exam  Nursing note and vitals reviewed. Constitutional: She is oriented to person, place, and time. She appears well-developed and well-nourished. No distress.  HENT:  Head: Normocephalic and atraumatic.  Eyes: Conjunctivae and EOM are normal. Right eye exhibits no discharge. Left eye exhibits no discharge.  Neck: Normal range of motion. Neck supple. No tracheal deviation present.  Cardiovascular: Normal rate, regular rhythm and normal heart sounds.  Exam reveals no friction rub.   No murmur heard. Pulses:      Radial pulses are 2+ on the right side, and 2+ on the left side.  Pulmonary/Chest: Effort normal and breath sounds normal. No respiratory distress. She has no wheezes. She has no rales.  Abdominal: Soft. Bowel sounds are normal. She exhibits no distension. There is no tenderness. There is no rebound and no guarding.  Obese  Negative abdominal distension  BS normoactive in all 4 quadrants  Abdomen soft upon palpation  Negative peritoneal signs  Negative rigidity or guarding noted  Genitourinary:     Genital warts identified to the right upper thigh, inguinal region with raw, irritated skin. Negative drainage or bleeding noted. Negative flaking or scabbing of the skin identified. Negative warmth upon palpation. Negative red streaks.  Exam chaperoned with tech.   Musculoskeletal: Normal range of motion.  Lymphadenopathy:    She has no cervical adenopathy.  Neurological: She is alert and oriented to person, place, and time. No cranial nerve deficit. She exhibits normal muscle tone. Coordination normal.  Skin: Skin is warm and dry. No rash noted. She is not diaphoretic. No erythema.  Please see GU  Psychiatric: She has a normal mood and affect. Her behavior is normal.  Thought content normal.    ED Course  Procedures (including critical care time)  Results for orders placed during the hospital encounter of 07/28/14  URINALYSIS, ROUTINE W REFLEX MICROSCOPIC      Result Value Ref Range   Color, Urine YELLOW  YELLOW   APPearance CLOUDY (*) CLEAR   Specific Gravity, Urine 1.010  1.005 - 1.030   pH 5.0  5.0 - 8.0   Glucose, UA NEGATIVE  NEGATIVE mg/dL   Hgb urine dipstick NEGATIVE  NEGATIVE   Bilirubin Urine NEGATIVE  NEGATIVE   Ketones, ur NEGATIVE  NEGATIVE mg/dL   Protein, ur NEGATIVE  NEGATIVE mg/dL   Urobilinogen, UA 1.0  0.0 - 1.0 mg/dL   Nitrite NEGATIVE  NEGATIVE   Leukocytes, UA SMALL (*) NEGATIVE  URINE MICROSCOPIC-ADD ON      Result Value Ref Range   Squamous  Epithelial / LPF FEW (*) RARE   WBC, UA 3-6  <3 WBC/hpf   Bacteria, UA FEW (*) RARE    Labs Review Labs Reviewed  URINALYSIS, ROUTINE W REFLEX MICROSCOPIC - Abnormal; Notable for the following:    APPearance CLOUDY (*)    Leukocytes, UA SMALL (*)    All other components within normal limits  URINE MICROSCOPIC-ADD ON - Abnormal; Notable for the following:    Squamous Epithelial / LPF FEW (*)    Bacteria, UA FEW (*)    All other components within normal limits    Imaging Review No results found.   EKG Interpretation None      MDM   Final diagnoses:  Genital warts  Skin irritation    Medications  oxyCODONE-acetaminophen (PERCOCET/ROXICET) 5-325 MG per tablet 1 tablet (1 tablet Oral Given 07/28/14 1706)   Filed Vitals:   07/28/14 1256 07/28/14 1703 07/28/14 1907  BP: 155/76 133/63 117/79  Pulse: 97 96 83  Temp: 97.7 F (36.5 C)    TempSrc: Oral    Resp: 20 18 17   Height: 5\' 8"  (1.727 m)    Weight: 304 lb 7 oz (138.092 kg)    SpO2: 93% 99%     Urinalysis unremarkable - appears to be contaminated. Doubt UTI. Doubt pyelonephritis.  Patient presenting to the ED with genital warts localized to the upper aspect of the right thigh, medial region. Irritated, red  raw skin noted from gel use. Negative warmth upon palpation. Negative signs of cellulitis. Negative red streaks. Appears to be a chaffing discomfort.  This provider spoke with attending physician, Dr. Alyson Locket, who recommended patient to have gel discontinued and for pain medications to be administered. Recommended patient to follow up with dermatologist and PCP. Patient stable, afebrile. Patient not septic appearing. Discharged patient. Discussed with patient to discontinue gel for this is what is leading to the skin irritation. Discussed with patient to apply cool compressions. Discussed with patient to follow-up with PCP and dermatologist. Discussed with patient to closely monitor symptoms and if symptoms are to worsen or change to report back to the ED - strict return instructions given.  Patient agreed to plan of care, understood, all questions answered.   [name] Mead, PA-C 07/28/14 2033

## 2014-07-28 NOTE — ED Notes (Signed)
PT states she was dx with genital warts appx 2 weeks ago. States she was prescribed Condylox Gel which has aggravated her vaginal area. States "it is so irritated I can hardly walk." Pt in NAD.

## 2014-07-28 NOTE — ED Notes (Signed)
Pt st's she has been using cream that her MD prescribed for her genital warts and now feels raw on inner thighs

## 2014-08-01 NOTE — ED Provider Notes (Signed)
Medical screening examination/treatment/procedure(s) were performed by non-physician practitioner and as supervising physician I was immediately available for consultation/collaboration.   EKG Interpretation None        Tanna Furry, MD 08/01/14 [phone]

## 2014-08-20 NOTE — Addendum Note (Signed)
Addended by: Hulan Fray on: 08/20/2014 08:31 PM   Modules accepted: Orders

## 2014-08-25 ENCOUNTER — Emergency Department (HOSPITAL_COMMUNITY): Payer: Medicare Other

## 2014-08-25 ENCOUNTER — Encounter (HOSPITAL_COMMUNITY): Payer: Self-pay | Admitting: Emergency Medicine

## 2014-08-25 ENCOUNTER — Inpatient Hospital Stay (HOSPITAL_COMMUNITY)
Admission: EM | Admit: 2014-08-25 | Discharge: 2014-08-27 | DRG: 191 | Disposition: A | Discharge status: Home / Self Care | Payer: Medicare Other | Attending: Internal Medicine | Admitting: Internal Medicine

## 2014-08-25 DIAGNOSIS — J441 Chronic obstructive pulmonary disease with (acute) exacerbation: Principal | ICD-10-CM | POA: Diagnosis present

## 2014-08-25 DIAGNOSIS — G4733 Obstructive sleep apnea (adult) (pediatric): Secondary | ICD-10-CM | POA: Diagnosis present

## 2014-08-25 DIAGNOSIS — Z9089 Acquired absence of other organs: Secondary | ICD-10-CM | POA: Diagnosis present

## 2014-08-25 DIAGNOSIS — Z90710 Acquired absence of both cervix and uterus: Secondary | ICD-10-CM | POA: Diagnosis not present

## 2014-08-25 DIAGNOSIS — Z87891 Personal history of nicotine dependence: Secondary | ICD-10-CM

## 2014-08-25 DIAGNOSIS — Z6841 Body Mass Index (BMI) 40.0 and over, adult: Secondary | ICD-10-CM | POA: Diagnosis not present

## 2014-08-25 DIAGNOSIS — F419 Anxiety disorder, unspecified: Secondary | ICD-10-CM | POA: Diagnosis present

## 2014-08-25 DIAGNOSIS — K219 Gastro-esophageal reflux disease without esophagitis: Secondary | ICD-10-CM | POA: Diagnosis present

## 2014-08-25 DIAGNOSIS — Z888 Allergy status to other drugs, medicaments and biological substances status: Secondary | ICD-10-CM | POA: Diagnosis not present

## 2014-08-25 DIAGNOSIS — Z9981 Dependence on supplemental oxygen: Secondary | ICD-10-CM | POA: Diagnosis not present

## 2014-08-25 DIAGNOSIS — J45909 Unspecified asthma, uncomplicated: Secondary | ICD-10-CM | POA: Diagnosis present

## 2014-08-25 DIAGNOSIS — G4700 Insomnia, unspecified: Secondary | ICD-10-CM | POA: Diagnosis present

## 2014-08-25 DIAGNOSIS — F329 Major depressive disorder, single episode, unspecified: Secondary | ICD-10-CM | POA: Diagnosis present

## 2014-08-25 DIAGNOSIS — J449 Chronic obstructive pulmonary disease, unspecified: Secondary | ICD-10-CM | POA: Diagnosis present

## 2014-08-25 DIAGNOSIS — E669 Obesity, unspecified: Secondary | ICD-10-CM | POA: Diagnosis present

## 2014-08-25 DIAGNOSIS — Z79899 Other long term (current) drug therapy: Secondary | ICD-10-CM

## 2014-08-25 DIAGNOSIS — R0602 Shortness of breath: Secondary | ICD-10-CM | POA: Diagnosis not present

## 2014-08-25 DIAGNOSIS — F32A Depression, unspecified: Secondary | ICD-10-CM | POA: Diagnosis present

## 2014-08-25 LAB — CBC
HCT: 44.7 % (ref 36.0–46.0)
HEMATOCRIT: 46.1 % — AB (ref 36.0–46.0)
HEMOGLOBIN: 15.6 g/dL — AB (ref 12.0–15.0)
Hemoglobin: 15.5 g/dL — ABNORMAL HIGH (ref 12.0–15.0)
MCH: 29.4 pg (ref 26.0–34.0)
MCH: 28.6 pg (ref 26.0–34.0)
MCHC: 34.7 g/dL (ref 30.0–36.0)
MCHC: 33.8 g/dL (ref 30.0–36.0)
MCV: 84.7 fL (ref 78.0–100.0)
MCV: 84.6 fL (ref 78.0–100.0)
Platelets: 159 10*3/uL (ref 150–400)
Platelets: 163 10*3/uL (ref 150–400)
RBC: 5.28 MIL/uL — AB (ref 3.87–5.11)
RBC: 5.45 MIL/uL — ABNORMAL HIGH (ref 3.87–5.11)
RDW: 14.7 % (ref 11.5–15.5)
RDW: 14.8 % (ref 11.5–15.5)
WBC: 6.3 10*3/uL (ref 4.0–10.5)
WBC: 4.7 10*3/uL (ref 4.0–10.5)

## 2014-08-25 LAB — BASIC METABOLIC PANEL
Anion gap: 14 (ref 5–15)
BUN: 9 mg/dL (ref 6–23)
CALCIUM: 8.9 mg/dL (ref 8.4–10.5)
CO2: 25 mEq/L (ref 19–32)
Chloride: 100 mEq/L (ref 96–112)
Creatinine, Ser: 0.61 mg/dL (ref 0.50–1.10)
GFR calc Af Amer: >90 mL/min (ref >90)
GLUCOSE: 109 mg/dL — AB (ref 70–99)
Potassium: 3.8 mEq/L (ref 3.7–5.3)
SODIUM: 139 meq/L (ref 137–147)

## 2014-08-25 LAB — I-STAT TROPONIN, ED: Troponin i, poc: 0 ng/mL (ref 0.00–0.08)

## 2014-08-25 MED ORDER — LEVOFLOXACIN IN D5W 250 MG/50ML IV SOLN: 250.0000 mg | INTRAVENOUS | Status: DC

## 2014-08-25 MED ORDER — PNEUMOCOCCAL VAC POLYVALENT 25 MCG/0.5ML IJ INJ
0.5000 mL | INJECTION | INTRAMUSCULAR | Status: AC
  Administered 2014-08-26: 0.5 mL via INTRAMUSCULAR
  Filled 2014-08-25: qty 0.5

## 2014-08-25 MED ORDER — LEVOFLOXACIN IN D5W 750 MG/150ML IV SOLN
750.0000 mg | Freq: Once | INTRAVENOUS | Status: AC
  Administered 2014-08-25: 750 mg via INTRAVENOUS
  Filled 2014-08-25: qty 150

## 2014-08-25 MED ORDER — TRAZODONE HCL 150 MG PO TABS
150.0000 mg | ORAL_TABLET | Freq: Every evening | ORAL | Status: DC | PRN
  Administered 2014-08-26: 150 mg via ORAL
  Filled 2014-08-25 (×2): qty 1

## 2014-08-25 MED ORDER — GABAPENTIN 300 MG PO CAPS
600.0000 mg | ORAL_CAPSULE | Freq: Every day | ORAL | Status: DC
Start: 2014-08-25 — End: 2014-08-25
  Filled 2014-08-25: qty 2

## 2014-08-25 MED ORDER — METHYLPREDNISOLONE SODIUM SUCC 125 MG IJ SOLR
125.0000 mg | Freq: Once | INTRAMUSCULAR | Status: AC
  Administered 2014-08-25: 125 mg via INTRAVENOUS
  Filled 2014-08-25: qty 2

## 2014-08-25 MED ORDER — ALBUTEROL SULFATE (2.5 MG/3ML) 0.083% IN NEBU
5.0000 mg | INHALATION_SOLUTION | Freq: Once | RESPIRATORY_TRACT | Status: AC
  Administered 2014-08-25: 5 mg via RESPIRATORY_TRACT
  Filled 2014-08-25: qty 6

## 2014-08-25 MED ORDER — ONDANSETRON HCL 4 MG/2ML IJ SOLN: 4.0000 mg | Freq: Four times a day (QID) | INTRAMUSCULAR | Status: DC | PRN

## 2014-08-25 MED ORDER — LEVOFLOXACIN IN D5W 500 MG/100ML IV SOLN: 500.0000 mg | INTRAVENOUS | Status: DC

## 2014-08-25 MED ORDER — PREDNISONE 20 MG PO TABS
20.0000 mg | ORAL_TABLET | Freq: Every day | ORAL | Status: DC
  Filled 2014-08-25: qty 1

## 2014-08-25 MED ORDER — PREDNISONE 20 MG PO TABS
40.0000 mg | ORAL_TABLET | Freq: Every day | ORAL | Status: DC
  Administered 2014-08-26 – 2014-08-27 (×2): 40 mg via ORAL
  Filled 2014-08-25 (×3): qty 2

## 2014-08-25 MED ORDER — SODIUM CHLORIDE 0.9 % IJ SOLN
3.0000 mL | Freq: Two times a day (BID) | INTRAMUSCULAR | Status: DC
  Administered 2014-08-25 – 2014-08-26 (×3): 3 mL via INTRAVENOUS

## 2014-08-25 MED ORDER — GUAIFENESIN-DM 100-10 MG/5ML PO SYRP
5.0000 mL | ORAL_SOLUTION | ORAL | Status: DC | PRN
  Filled 2014-08-25: qty 5

## 2014-08-25 MED ORDER — OXYCODONE-ACETAMINOPHEN 5-325 MG PO TABS
1.0000 | ORAL_TABLET | Freq: Once | ORAL | Status: AC
  Administered 2014-08-25: 1 via ORAL
  Filled 2014-08-25: qty 1

## 2014-08-25 MED ORDER — IPRATROPIUM BROMIDE 0.02 % IN SOLN
0.5000 mg | Freq: Once | RESPIRATORY_TRACT | Status: AC
  Administered 2014-08-25: 0.5 mg via RESPIRATORY_TRACT
  Filled 2014-08-25: qty 2.5

## 2014-08-25 MED ORDER — SERTRALINE HCL 25 MG PO TABS
25.0000 mg | ORAL_TABLET | Freq: Every day | ORAL | Status: DC
Start: 2014-08-25 — End: 2014-08-25

## 2014-08-25 MED ORDER — ONDANSETRON HCL 4 MG PO TABS: 4.0000 mg | ORAL_TABLET | Freq: Four times a day (QID) | ORAL | Status: DC | PRN

## 2014-08-25 MED ORDER — LORATADINE 10 MG PO TABS
10.0000 mg | ORAL_TABLET | Freq: Every day | ORAL | Status: DC
  Administered 2014-08-25 – 2014-08-27 (×3): 10 mg via ORAL
  Filled 2014-08-25 (×3): qty 1

## 2014-08-25 MED ORDER — SERTRALINE HCL 100 MG PO TABS
100.0000 mg | ORAL_TABLET | Freq: Every morning | ORAL | Status: DC
  Administered 2014-08-26 – 2014-08-27 (×2): 100 mg via ORAL
  Filled 2014-08-25 (×2): qty 1

## 2014-08-25 MED ORDER — MOMETASONE FURO-FORMOTEROL FUM 100-5 MCG/ACT IN AERO
2.0000 | INHALATION_SPRAY | Freq: Two times a day (BID) | RESPIRATORY_TRACT | Status: DC
  Administered 2014-08-26 – 2014-08-27 (×2): 2 via RESPIRATORY_TRACT
  Filled 2014-08-25 (×2): qty 8.8

## 2014-08-25 MED ORDER — GABAPENTIN 600 MG PO TABS
600.0000 mg | ORAL_TABLET | Freq: Every day | ORAL | Status: DC
  Administered 2014-08-25: 600 mg via ORAL
  Filled 2014-08-25: qty 1

## 2014-08-25 MED ORDER — INFLUENZA VAC SPLIT QUAD 0.5 ML IM SUSY
0.5000 mL | PREFILLED_SYRINGE | INTRAMUSCULAR | Status: AC
  Administered 2014-08-26: 0.5 mL via INTRAMUSCULAR
  Filled 2014-08-25: qty 0.5

## 2014-08-25 MED ORDER — LEVOFLOXACIN IN D5W 750 MG/150ML IV SOLN
750.0000 mg | INTRAVENOUS | Status: DC
  Administered 2014-08-26: 750 mg via INTRAVENOUS
  Filled 2014-08-25 (×2): qty 150

## 2014-08-25 MED ORDER — IPRATROPIUM-ALBUTEROL 0.5-2.5 (3) MG/3ML IN SOLN
3.0000 mL | RESPIRATORY_TRACT | Status: DC
  Administered 2014-08-25 – 2014-08-26 (×3): 3 mL via RESPIRATORY_TRACT
  Filled 2014-08-25 (×5): qty 3

## 2014-08-25 MED ORDER — HEPARIN SODIUM (PORCINE) 5000 UNIT/ML IJ SOLN
5000.0000 [IU] | Freq: Three times a day (TID) | INTRAMUSCULAR | Status: DC
  Administered 2014-08-25 – 2014-08-27 (×6): 5000 [IU] via SUBCUTANEOUS
  Filled 2014-08-25 (×9): qty 1

## 2014-08-25 NOTE — Progress Notes (Signed)
Internal medicine notified of pt arrival.
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
## 2014-08-25 NOTE — Progress Notes (Signed)
Paged provider on call about pt's admission orders. Awaiting further orders

## 2014-08-25 NOTE — Progress Notes (Signed)
Arrived to 5W#36, a/ox4, Denies nausea/pain, instructed on usage of call system and room surroundings.

## 2014-08-25 NOTE — ED Provider Notes (Signed)
CSN: [id number]     Arrival date & time 08/25/14  1052 History   First MD Initiated Contact with Patient 08/25/14 1128     Chief Complaint  Patient presents with  . Cough  . Back Pain     (Consider location/radiation/quality/duration/timing/severity/associated sxs/prior Treatment) HPI Pt presents with c/o productive cough, fatigue, decreased appetite.  She states symptoms have been going on for approx 1 week.  Pt has hx of COPD.  No fever/chills.  No vomiting.  She also c/o low back pain.  She states that she has home O2, but does not use it- was told to only use it at night, but does not use it at all.  No chest pain, no leg swelling.  No hx of DVT/PE, no recent travel/trauma/surgery.  There are no other associated systemic symptoms, there are no other alleviating or modifying factors.   Past Medical History  Diagnosis Date  . GERD (gastroesophageal reflux disease)   . Insomnia   . Depression   . Obesity   . Asthma     Chronic, with restrictive component 2/2 obesity  . Sleep apnea     "couldn't afford mask so I didn't get it" (04/30/2014)  . Anxiety   . On home oxygen therapy     "3L at night only when I get sick" (04/30/2014)   Past Surgical History  Procedure Laterality Date  . Vaginal hysterectomy  ~ 1978  . Appendectomy  ~ 2002   Family History  Problem Relation Age of Onset  . Breast cancer Mother   . Breast cancer Maternal Grandmother   . Breast cancer Maternal Aunt   . Alcohol abuse Mother   . Emphysema Mother     smoked  . Asthma Mother    History  Substance Use Topics  . Smoking status: Former Smoker -- 2.00 packs/day for 44 years    Types: Cigarettes    Quit date: 11/07/2012  . Smokeless tobacco: Never Used  . Alcohol Use: No   OB History   Grav Para Term Preterm Abortions TAB SAB Ect Mult Living                 Review of Systems ROS reviewed and all otherwise negative except for mentioned in HPI    Allergies  Ibuprofen  Home Medications    Prior to Admission medications   Medication Sig Start Date End Date Taking? Authorizing Provider  acetaminophen (TYLENOL) 500 MG tablet Take 1,000 mg by mouth every 6 (six) hours as needed for moderate pain (back pain).   Yes Historical Provider, MD  albuterol (PROVENTIL HFA;VENTOLIN HFA) 108 (90 BASE) MCG/ACT inhaler Inhale 2 puffs into the lungs every 6 (six) hours as needed for wheezing or shortness of breath (wheezing).   Yes Historical Provider, MD  albuterol (PROVENTIL) (5 MG/ML) 0.5% nebulizer solution Take 2.5 mg by nebulization every 6 (six) hours as needed for wheezing or shortness of breath (wheezing).    Yes Historical Provider, MD  ALPRAZolam Duanne Moron) 0.5 MG tablet Take 0.5 mg by mouth at bedtime as needed for anxiety (insomnia).   Yes Historical Provider, MD  gabapentin (NEURONTIN) 300 MG capsule Take 600 mg by mouth at bedtime.   Yes Historical Provider, MD  ipratropium (ATROVENT) 0.02 % nebulizer solution Take 0.5 mg by nebulization every 6 (six) hours as needed for wheezing or shortness of breath (wheezing).    Yes Historical Provider, MD  sertraline (ZOLOFT) 100 MG tablet Take 100 mg by mouth every morning.  Yes Historical Provider, MD  traZODone (DESYREL) 150 MG tablet Take 150 mg by mouth at bedtime as needed for sleep (insomnia).   Yes Historical Provider, MD   BP 119/50  Pulse 99  Temp(Src) 98.2 F (36.8 C) (Oral)  Resp 21  SpO2 90% Vitals reviewed Physical Exam Physical Examination: General appearance - alert, well appearing, and in no distress Mental status - alert, oriented to person, place, and time Mouth - mucous membranes moist, pharynx normal without lesions Chest - bilateral expiratory wheezing, normal respiratory effort, no rales or rhonchi, symmetric air entry Heart - normal rate, regular rhythm, normal S1, S2, no murmurs, rubs, clicks or gallops Abdomen - soft, nontender, nondistended, no masses or organomegaly Back- no midline tenderness to palpation,  bilateral lumbar paraspinal tenderness to palpation Extremities - peripheral pulses normal, no pedal edema, no clubbing or cyanosis Skin - normal coloration and turgor, no rashes  ED Course  Procedures (including critical care time)  CRITICAL CARE Performed by: Threasa Beards Total critical care time: 40 Critical care time was exclusive of separately billable procedures and treating other patients. Critical care was necessary to treat or prevent imminent or life-threatening deterioration. Critical care was time spent personally by me on the following activities: development of treatment plan with patient and/or surrogate as well as nursing, discussions with consultants, evaluation of patient's response to treatment, examination of patient, obtaining history from patient or surrogate, ordering and performing treatments and interventions, ordering and review of laboratory studies, ordering and review of radiographic studies, pulse oximetry and re-evaluation of patient's condition.  4:04 PM d/w [facility] resident, Dr. Redmond Pulling at [facility], pt to be admitted to [facility], telemetry bed.   Labs Review Labs Reviewed  BASIC METABOLIC PANEL - Abnormal; Notable for the following:    Glucose, Bld 109 (*)    All other components within normal limits  CBC - Abnormal; Notable for the following:    RBC 5.28 (*)    Hemoglobin 15.5 (*)    All other components within normal limits  I-STAT TROPOININ, ED    Imaging Review Dg Chest 2 View (if Patient Has Fever And/or Copd)  08/25/2014   CLINICAL DATA:  Shortness of breath and productive cough with lower rib and back pain; history of asthma morbid obesity, and sleep apnea; lung history of heavy tobacco use, discontinued in January of 2014.  EXAM: CHEST  2 VIEW  COMPARISON:  PA and lateral chest of April 30, 2014 an February 01, 2012.  FINDINGS: The lungs are well-expanded. There is no focal infiltrate. The interstitial markings are coarse but stable. The heart and pulmonary  vascularity are within the limits of normal. The mediastinum is normal in width. There is no pleural effusion or pneumothorax. The bony thorax is unremarkable.  IMPRESSION: COPD. There is no evidence of pneumonia nor CHF. One cannot exclude acute bronchitis in the appropriate clinical setting.   Electronically Signed   By: David  Martinique   On: 08/25/2014 13:34     EKG Interpretation   Date/Time:  Monday August 25 2014 11:12:31 EDT Ventricular Rate:  98 PR Interval:  150 QRS Duration: 90 QT Interval:  372 QTC Calculation: 475 R Axis:   -24 Text Interpretation:  Sinus rhythm Borderline left axis deviation  Anteroseptal infarct, age indeterminate No significant change since last  tracing Confirmed by [facility]  MD, Wilena Tyndall [phone]) on 08/25/2014 1:02:55 PM     3:43 PM pt agreeable with plan for admission.  Her wheezing is improved, she continues to  have O2 sats in mid 80s on 3L [place].  MDM   Final diagnoses:  COPD exacerbation   Pt is low risk well's criteria, PERC 1 for age.  Pt responding to treatments with nebs, has also had steroids and antibiotics.  Due to continued O2 requirement she will be admitted to [facility], her PMD is [facility]PC- to telemetry bed.       Threasa Beards, MD 08/25/14 [phone]

## 2014-08-25 NOTE — ED Notes (Signed)
Pt reports URI symptoms for the past week with productive cough, tiredness, decreased appetite, and sob. Hx of copd and asthma. Also has back pain and ankle pain.

## 2014-08-25 NOTE — ED Notes (Signed)
MD at bedside. 

## 2014-08-26 DIAGNOSIS — K219 Gastro-esophageal reflux disease without esophagitis: Secondary | ICD-10-CM

## 2014-08-26 DIAGNOSIS — R0602 Shortness of breath: Secondary | ICD-10-CM

## 2014-08-26 DIAGNOSIS — R05 Cough: Secondary | ICD-10-CM

## 2014-08-26 DIAGNOSIS — J441 Chronic obstructive pulmonary disease with (acute) exacerbation: Secondary | ICD-10-CM | POA: Diagnosis not present

## 2014-08-26 DIAGNOSIS — G4733 Obstructive sleep apnea (adult) (pediatric): Secondary | ICD-10-CM

## 2014-08-26 DIAGNOSIS — F418 Other specified anxiety disorders: Secondary | ICD-10-CM

## 2014-08-26 DIAGNOSIS — J45909 Unspecified asthma, uncomplicated: Secondary | ICD-10-CM

## 2014-08-26 LAB — BASIC METABOLIC PANEL
ANION GAP: 15 (ref 5–15)
BUN: 9 mg/dL (ref 6–23)
CALCIUM: 9.4 mg/dL (ref 8.4–10.5)
CO2: 24 meq/L (ref 19–32)
CREATININE: 0.51 mg/dL (ref 0.50–1.10)
Chloride: 100 mEq/L (ref 96–112)
GFR calc Af Amer: >90 mL/min (ref >90)
Glucose, Bld: 151 mg/dL — ABNORMAL HIGH (ref 70–99)
Potassium: 4 mEq/L (ref 3.7–5.3)
Sodium: 139 mEq/L (ref 137–147)

## 2014-08-26 LAB — CBC
HCT: 43.7 % (ref 36.0–46.0)
Hemoglobin: 14.7 g/dL (ref 12.0–15.0)
MCH: 29.1 pg (ref 26.0–34.0)
MCHC: 33.6 g/dL (ref 30.0–36.0)
MCV: 86.5 fL (ref 78.0–100.0)
PLATELETS: 165 10*3/uL (ref 150–400)
RBC: 5.05 MIL/uL (ref 3.87–5.11)
RDW: 14.8 % (ref 11.5–15.5)
WBC: 8.4 10*3/uL (ref 4.0–10.5)

## 2014-08-26 MED ORDER — IPRATROPIUM-ALBUTEROL 0.5-2.5 (3) MG/3ML IN SOLN
3.0000 mL | Freq: Three times a day (TID) | RESPIRATORY_TRACT | Status: DC
  Administered 2014-08-26 – 2014-08-27 (×2): 3 mL via RESPIRATORY_TRACT
  Filled 2014-08-26 (×2): qty 3

## 2014-08-26 MED ORDER — ACETAMINOPHEN 500 MG PO TABS
1000.0000 mg | ORAL_TABLET | Freq: Four times a day (QID) | ORAL | Status: DC | PRN
  Administered 2014-08-26: 1000 mg via ORAL
  Filled 2014-08-26: qty 2

## 2014-08-26 MED ORDER — ALPRAZOLAM 0.5 MG PO TABS
0.5000 mg | ORAL_TABLET | Freq: Every day | ORAL | Status: DC | PRN
  Administered 2014-08-26: 0.5 mg via ORAL
  Filled 2014-08-26: qty 1

## 2014-08-26 NOTE — Progress Notes (Signed)
Subjective: [name] [name] was seen in her room today. She said that she feels about 10% better than yesterday but does feel improvement. She feels that she needs the oxygen to help her breathe better.   Objective: Vital signs in last 24 hours: Filed Vitals:   08/25/14 2048 08/25/14 2100 08/26/14 0054 08/26/14 0523  BP:  129/85  125/83  Pulse:  91  91  Temp:  97.5 F (36.4 C)  98 F (36.7 C)  TempSrc:  Oral  Oral  Resp:  18  18  Height:      Weight:      SpO2: 97% 93% 95% 98%   Weight change:   Intake/Output Summary (Last 24 hours) at 08/26/14 1135 Last data filed at 08/26/14 0900  Gross per 24 hour  Intake    240 ml  Output      0 ml  Net    240 ml   General Apperance: NAD, anxious  Head: Normocephalic, atraumatic  Eyes: PERRL, EOMI, anicteric sclera  Ears: Normal external ear canal  Nose: Nares normal, septum midline, mucosa normal  Throat: Lips, mucosa and tongue normal  Neck: Supple, trachea midline  Back: No tenderness or bony abnormality  Lungs: Bilateral expiratory wheezes and rhonchi throughout all lung fields  Chest Wall: Posterior chest wall tender to palpation, no deformity  Heart: Regular rate and rhythm, no murmur/rub/gallop  Abdomen: Soft, nontender, nondistended, no rebound/guarding  Extremities: Normal, atraumatic, warm and well perfused, no edema  Pulses: 2+ throughout  Skin: No rashes or lesions  Neurologic: Alert and oriented x 3. Normal strength and sensation  Lab Results: Results for [name] [name], [name] [name] (MRN [id number]) as of 08/26/2014 11:37  Ref. Range 08/26/2014 06:53  Sodium Latest Range: 137-147 mEq/L 139  Potassium Latest Range: 3.7-5.3 mEq/L 4.0  Chloride Latest Range: 96-112 mEq/L 100  CO2 Latest Range: 19-32 mEq/L 24  BUN Latest Range: 6-23 mg/dL 9  Creatinine Latest Range: 0.50-1.10 mg/dL 0.51  Calcium Latest Range: 8.4-10.5 mg/dL 9.4  GFR calc non Af Amer Latest Range: >90 mL/min >90  GFR calc Af Amer Latest Range: >90 mL/min >90    Glucose Latest Range: 70-99 mg/dL 151 (H)  Anion gap Latest Range: 5-15  15  WBC Latest Range: 4.0-10.5 K/uL 8.4  RBC Latest Range: 3.87-5.11 MIL/uL 5.05  Hemoglobin Latest Range: 12.0-15.0 g/dL 14.7  HCT Latest Range: 36.0-46.0 % 43.7  MCV Latest Range: 78.0-100.0 fL 86.5  MCH Latest Range: 26.0-34.0 pg 29.1  MCHC Latest Range: 30.0-36.0 g/dL 33.6  RDW Latest Range: 11.5-15.5 % 14.8  Platelets Latest Range: 150-400 K/uL 165   Studies/Results: Dg Chest 2 View (if Patient Has Fever And/or Copd)  08/25/2014   CLINICAL DATA:  Shortness of breath and productive cough with lower rib and back pain; history of asthma morbid obesity, and sleep apnea; lung history of heavy tobacco use, discontinued in January of 2014.  EXAM: CHEST  2 VIEW  COMPARISON:  PA and lateral chest of April 30, 2014 an February 01, 2012.  FINDINGS: The lungs are well-expanded. There is no focal infiltrate. The interstitial markings are coarse but stable. The heart and pulmonary vascularity are within the limits of normal. The mediastinum is normal in width. There is no pleural effusion or pneumothorax. The bony thorax is unremarkable.  IMPRESSION: COPD. There is no evidence of pneumonia nor CHF. One cannot exclude acute bronchitis in the appropriate clinical setting.   Electronically Signed   By: David  Martinique   On: 08/25/2014 13:34  Medications:  Scheduled Meds: . heparin  5,000 Units Subcutaneous 3 times per day  . Influenza vac split quadrivalent PF  0.5 mL Intramuscular Tomorrow-1000  . ipratropium-albuterol  3 mL Nebulization Q4H  . levofloxacin (LEVAQUIN) IV  750 mg Intravenous Q24H  . loratadine  10 mg Oral Daily  . mometasone-formoterol  2 puff Inhalation BID  . pneumococcal 23 valent vaccine  0.5 mL Intramuscular Tomorrow-1000  . predniSONE  40 mg Oral Q breakfast  . sertraline  100 mg Oral q morning - 10a  . sodium chloride  3 mL Intravenous Q12H   Continuous Infusions:  PRN Meds:.acetaminophen, ALPRAZolam,  guaiFENesin-dextromethorphan, ondansetron (ZOFRAN) IV, ondansetron, traZODone  Assessment/Plan: Active Problems:   GERD (gastroesophageal reflux disease)   Depression   Anxiety disorder   OSA (obstructive sleep apnea)   Asthma, chronic   COPD exacerbation  Exacerbation of underlying asthma and/or COPD as part of possible "overlap" syndrome: Patient reports symptomatic improvement overnight. She has history of asthma with restrictive component (FEV1 48% on PFTs 12/12/2012) given her significant smoking history, she may have developed "overlap" syndrome [employer] [facility] 2015; [phone]). Given her recent URI symptoms, this is likely an exacerbation of her underlying respiratory issue. She received IV Levaquin 750mg  and Solumedrol 125mg  in the ED.  -continue home Dulera 100-5 mcg 2 puff BID  -Robitussin DM 59mL Q4hr prn cough  -duoneb 83mL q4hr  -IV Levaquin 750mg  daily for 5 day total abx course  -prednisone 40mg  daily for 5 day total steroid course  -loratadine 10mg  daily  -supplemental 3L O2, attempt to wean off later today   Depression/anxiety:  -continue home trazodone 150mg  QHS prn insomnia  -continue home Zoloft 100mg  QAM  -Gave xanax 0.5mg  prn for severe anxiety attack today   FEN: Heart healthy   DVT ppx: subq heparin 5000U TID  This is a Careers information officer Note.  The care of the patient was discussed with Dr. Dareen Piano and Dr. Hayes Ludwig and the assessment and plan formulated with their assistance.  Please see their attached note for official documentation of the daily encounter.   LOS: 1 day   Josiah Lobo, Med Student 08/26/2014, 11:35 AM

## 2014-08-26 NOTE — Progress Notes (Signed)
I have seen the patient and reviewed the daily progress note by Josiah Lobo MS 4 and discussed the care of the patient with them.  See below for documentation of my findings, assessment, and plans.  Subjective: She was seen and examined this morning. She states that she feels "10% better" but still needs oxygen. She continues to have a cough productive of yellow/green sputum. Denies chest pain, fever/chills. She has been anxious.   Objective: Vital signs in last 24 hours: Filed Vitals:   08/25/14 2100 08/26/14 0054 08/26/14 0523 08/26/14 1359  BP: 129/85  125/83 131/58  Pulse: 91  91 92  Temp: 97.5 F (36.4 C)  98 F (36.7 C) 98.2 F (36.8 C)  TempSrc: Oral  Oral Oral  Resp: 18  18 20   Height:      Weight:      SpO2: 93% 95% 98% 93%   Weight change:   Intake/Output Summary (Last 24 hours) at 08/26/14 1458 Last data filed at 08/26/14 0900  Gross per 24 hour  Intake    240 ml  Output      0 ml  Net    240 ml   Vitals reviewed. General: resting in bed, NAD, anxious HEENT: no scleral icterus Cardiac: RRR, no rubs, murmurs or gallops Pulm: Bilateral expiratory wheezing in all lung fields Abd: soft, nontender, nondistended, BS present Ext: warm and well perfused, no pedal edema Neuro: alert and oriented X3, follows commands appropriately, moves all extremities voluntarily  Lab Results: Reviewed and documented in Electronic Record Micro Results: Reviewed and documented in Electronic Record Studies/Results: Reviewed and documented in Electronic Record Medications: I have reviewed the patient's current medications. Scheduled Meds: . heparin  5,000 Units Subcutaneous 3 times per day  . ipratropium-albuterol  3 mL Nebulization Q4H  . levofloxacin (LEVAQUIN) IV  750 mg Intravenous Q24H  . loratadine  10 mg Oral Daily  . mometasone-formoterol  2 puff Inhalation BID  . predniSONE  40 mg Oral Q breakfast  . sertraline  100 mg Oral q morning - 10a  . sodium  chloride  3 mL Intravenous Q12H   Continuous Infusions:  PRN Meds:.acetaminophen, ALPRAZolam, guaiFENesin-dextromethorphan, ondansetron (ZOFRAN) IV, ondansetron, traZODone Assessment/Plan: 58 year old woman with PMH of anxiety, depression, GERD, COPD, presenting with URI symptoms and increased dyspnea/sputum c/w COPD exacerbation.   COPD exacerbation: Likely provoked by URI (viral v allergic). She remains afebrile with CXR with no consolidation, no leucocytosis, making PNA less likely. Mild improvement overnight but still on 3L oxygen with O2 sats of 93-94%.  -Continue home Dulera -Continue Robitussin for cough -Duoneb q4h -Continue IV Levaquin (day 2/5)--transition to PO tomorrow -Continue Prednisone 40mg  daily  -Loratadine 10mg  daily -O2 supplementation PRN, will attempt to wean off tomorrow -Ambulation with pulse ox tomorrow  Depression/Anxiety: At baseline -Continue home trazodone 150mg  qHS for insomnia -Continue home Zoloft 100mg   -Continue home Xanax 0.5mg  PO daily PRN for anxiety (received one dose today)  VTE prophylaxis:  Heparin [place] TID  FEN: NSL [place] as neeed HH diet   Dispo: Disposition is deferred at this time, awaiting improvement of current medical problems.  Anticipated discharge in approximately 1-2 day(s).   The patient does have a current PCP Bartholomew Crews, MD) and does need an [facility] hospital follow-up appointment after discharge.  The patient does not have transportation limitations that hinder transportation to clinic appointments.  .Services Needed at time of discharge: Y = Yes, Blank = No PT:   OT:  RN:   Equipment:   Other:     LOS: 1 day   Blain Pais, MD PGY3, IMTS 08/26/2014, 2:58 PM

## 2014-08-26 NOTE — Progress Notes (Signed)
CARE MANAGEMENT NOTE 08/26/2014  Patient:  [name] [name], [name] [name]   Account Number:  [id number]  Date Initiated:  08/26/2014  Documentation initiated by:  [facility]  Subjective/Objective Assessment:   COPD exac     Action/Plan:   lives at home with husband, Mr Tamala Julian   Anticipated DC Date:     Anticipated DC Plan:  [place]  CM consult      Choice offered to / List presented to:             Status of service:  In process, will continue to follow Medicare Important Message given?   (If response is "NO", the following Medicare IM given date fields will be blank) Date Medicare IM given:   Medicare IM given by:   Date Additional Medicare IM given:   Additional Medicare IM given by:    Discharge Disposition:    Per UR Regulation:    If discussed at Long Length of Stay Meetings, dates discussed:    Comments:  08/26/2014 1630 NCM spoke to pt and states she had difficulty paying for [facility] in the past. Pt may receive 1 free Dulera inhaler with coupon (if pt has not received in past). Will provide pt with coupon. She is not eligible for copay asisstance, pt has Medicare.  Pt goes to [facility] for anxiety and depression. States she was prescribed Abilify at the [facility] and [place] but she could not afford medication. Explained to discuss with her physician about samples, or follow up with manufacturer for assistance. Currently pt is not prescribed medication at this hospital stay. Pt has RW, nebulizer, and oxygen ( [facility])  at home. Husband to bring portable tank at dc. NCM will continue to follow for any dc needs. Jonnie Finner RN CCM Case Mgmt phone [phone]

## 2014-08-26 NOTE — Progress Notes (Deleted)
CARE MANAGEMENT NOTE 08/26/2014  Patient:  [name],[name]   Account Number:  [id number]  Date Initiated:  08/25/2014  Documentation initiated by:  TAYLOR,DEBORAH  Subjective/Objective Assessment:   dx pna  admit- lives with caregiver.     Action/Plan:   Anticipated DC Date:  08/26/2014   Anticipated DC Plan:  SKILLED NURSING FACILITY  In-house referral  Clinical Social Worker      DC Planning Services  CM consult      Choice offered to / List presented to:             Status of service:  In process, will continue to follow Medicare Important Message given?  YES (If response is "NO", the following Medicare IM given date fields will be blank) Date Medicare IM given:  08/25/2014 Medicare IM given by:  TAYLOR,DEBORAH Date Additional Medicare IM given:   Additional Medicare IM given by:    Discharge Disposition:  SKILLED NURSING FACILITY  Per UR Regulation:    If discussed at Long Length of Stay Meetings, dates discussed:    Comments:  08/25/14 1246 Deborah Taylor RN, BSN [phone] patient is for snf at dc, CSW following, for dc 10/20.   

## 2014-08-26 NOTE — H&P (Signed)
Pt seen and examined. Case d/w Dr. Randell Patient. Please refer to resident note for details  In brief, Pt is a 58 y/o female with PMH of asthma, OSA, obesity, GERD, anxiety p/w cough, SOB * 1 week. Pt states that she developed a cough approx 1 week ago productive of greenish phlegm. No hemoptysis. She complained of associated SOB and has been using her home O2 as needed with some relief. She took a day and a half of clarithromycin which improved her symptoms and then she ran out and symptoms returned. She also complained of chills, rhinorrhea and sinus congestion. Remaining ROS negative  Exam: Gen: AAO*3, NAD CV: RRR, normal heart sounds Pulm: b/l exp wheezes + Abd: soft, non tender, BS + Ext: no pedal edema  Assessment and Plan: 58 y/o female with acute COPD exacerbation   Acute COPD exacerbation: - c/w dulera, nebs q 4 hrs - c/w O2 via [place] prn - Monitor pulse ox - c/w prednisone taper - Change levaquin to PO in AM and complete 5 day course

## 2014-08-26 NOTE — Progress Notes (Signed)
Utilization review completed.  

## 2014-08-27 MED ORDER — LEVOFLOXACIN 750 MG PO TABS: 750.0000 mg | ORAL_TABLET | Freq: Every day | ORAL | Status: DC

## 2014-08-27 MED ORDER — DM-GUAIFENESIN ER 30-600 MG PO TB12
1.0000 | ORAL_TABLET | Freq: Two times a day (BID) | ORAL | Status: DC
  Administered 2014-08-27: 1 via ORAL
  Filled 2014-08-27 (×2): qty 1

## 2014-08-27 MED ORDER — PREDNISONE 20 MG PO TABS: 40.0000 mg | ORAL_TABLET | Freq: Every day | ORAL | Status: DC

## 2014-08-27 MED ORDER — LEVOFLOXACIN 750 MG PO TABS
750.0000 mg | ORAL_TABLET | Freq: Every day | ORAL | Status: DC
  Administered 2014-08-27: 750 mg via ORAL
  Filled 2014-08-27: qty 1

## 2014-08-27 MED ORDER — MOMETASONE FURO-FORMOTEROL FUM 100-5 MCG/ACT IN AERO: 2.0000 | INHALATION_SPRAY | Freq: Two times a day (BID) | RESPIRATORY_TRACT | Status: DC

## 2014-08-27 NOTE — Progress Notes (Signed)
  I have seen and examined the patient, and reviewed the daily progress note by Josiah Lobo, MS4 and discussed the care of the patient with them. Please see my progress note from 08/27/2014 for further details regarding assessment and plan.    Signed:  Francesca Oman, DO 08/27/2014, 11:47 AM

## 2014-08-27 NOTE — Discharge Instructions (Signed)
[name] [name], you were admitted for COPD exacerbation. You will need to finish your course of Levaquin and prednisone. If your symptoms worsen, please seek medical attention. You will follow up with the internal medicine clinic at Oct 26 on 10:45 a.m.

## 2014-08-27 NOTE — Progress Notes (Signed)
SATURATION QUALIFICATIONS: (This note is used to comply with regulatory documentation for home oxygen)  Patient Saturations on Room Air at Rest = 91%  Patient Saturations on Room Air while Ambulating = 92%   Please briefly explain why patient needs home oxygen: pt use 02 at home currently

## 2014-08-27 NOTE — Discharge Summary (Signed)
Name: [name] [name] MRN: [id number] DOB: Apr 19, 1956 58 y.o. PCP: Bartholomew Crews, MD  Date of Admission: 08/25/2014 10:57 AM Date of Discharge: 08/27/2014 Attending Physician: Aldine Contes, MD  Discharge Diagnosis: Principal Problem:   COPD exacerbation Active Problems:   GERD (gastroesophageal reflux disease)   Depression   Anxiety disorder   OSA (obstructive sleep apnea)   Asthma, chronic  Discharge Medications:   Medication List         acetaminophen 500 MG tablet  Commonly known as:  TYLENOL  Take 1,000 mg by mouth every 6 (six) hours as needed for moderate pain (back pain).     albuterol (5 MG/ML) 0.5% nebulizer solution  Commonly known as:  PROVENTIL  Take 2.5 mg by nebulization every 6 (six) hours as needed for wheezing or shortness of breath (wheezing).     albuterol 108 (90 BASE) MCG/ACT inhaler  Commonly known as:  PROVENTIL HFA;VENTOLIN HFA  Inhale 2 puffs into the lungs every 6 (six) hours as needed for wheezing or shortness of breath (wheezing).     ALPRAZolam 0.5 MG tablet  Commonly known as:  XANAX  Take 0.5 mg by mouth at bedtime as needed for anxiety (insomnia).     gabapentin 300 MG capsule  Commonly known as:  NEURONTIN  Take 600 mg by mouth at bedtime.     ipratropium 0.02 % nebulizer solution  Commonly known as:  ATROVENT  Take 0.5 mg by nebulization every 6 (six) hours as needed for wheezing or shortness of breath (wheezing).     levofloxacin 750 MG tablet  Commonly known as:  LEVAQUIN  Take 1 tablet (750 mg total) by mouth daily.     mometasone-formoterol 100-5 MCG/ACT Aero  Commonly known as:  DULERA  Inhale 2 puffs into the lungs 2 (two) times daily.     predniSONE 20 MG tablet  Commonly known as:  DELTASONE  Take 2 tablets (40 mg total) by mouth daily with breakfast.     sertraline 100 MG tablet  Commonly known as:  ZOLOFT  Take 100 mg by mouth every morning.     traZODone 150 MG tablet  Commonly known as:   DESYREL  Take 150 mg by mouth at bedtime as needed for sleep (insomnia).        Disposition and follow-up:   [name] [name] was discharged from [facility] in stable condition.  At the hospital follow up visit please address:  1.  How well did she recover from her exacerbation? Any further SOB or significant wheezing at home? How frequently has patient been needing to using her nebulizer? Is patient compliant with her [place]he Mannan?   2.  Is she still using oxygen at home at night or has there been a time where she had to use it during the day?  3. Any more anxiety attacks?   4.  Labs / imaging needed at time of follow-up: None.  5.  Pending labs/ test needing follow-up: None.   Follow-up Appointments: Oct 26 10:45 a.m.    Procedures Performed:  Dg Chest 2 View (if Patient Has Fever And/or Copd)  08/25/2014   CLINICAL DATA:  Shortness of breath and productive cough with lower rib and back pain; history of asthma morbid obesity, and sleep apnea; lung history of heavy tobacco use, discontinued in January of 2014.  EXAM: CHEST  2 VIEW  COMPARISON:  PA and lateral chest of April 30, 2014 an February 01, 2012.  FINDINGS: The  lungs are well-expanded. There is no focal infiltrate. The interstitial markings are coarse but stable. The heart and pulmonary vascularity are within the limits of normal. The mediastinum is normal in width. There is no pleural effusion or pneumothorax. The bony thorax is unremarkable.  IMPRESSION: COPD. There is no evidence of pneumonia nor CHF. One cannot exclude acute bronchitis in the appropriate clinical setting.   Electronically Signed   By: [name]  [name]   On: 08/25/2014 13:34   Admission HPI: [name] [name] is a 57 year old woman with history of asthma with restrictive component, OSA, obesity, GERD, anxiety presenting with productive cough and shortness of breath x 1 week. She reports her cough is productive of green-yellow sputum. No hemoptysis. She also  reports chills, diaphoresis, rhinorrhea, watery eyes, sinus congestion, palpitations, back pain, anxiety. She denies fever, chest pain, nausea, vomiting, abdominal pain, diarrhea, edema, rash. She denies sick contacts and has not had her flu shot. She has home O2 which she uses as needed - this has been helping her symptoms. She also has been using her nebulizer treatment three times a day which also help. She took clarithromycin 500mg  BID for a day and a half which improved her symptoms. She ran out and her symptoms got worse. Denies past hospitalizations/exacerbations requiring intubation or BiPAP.  Hospital Course by problem list:   COPD Exacerbation: [name] [name] presented with increased dyspnea and productive cough and rhonchi and end-expiratory wheezing was heard on exam. CXR did not show evidence of pneumonia or CHF. In the ED, she was given a dose of IV Levaquin and 125 mg Solumedrol. On admission, she was continued on IV Levaquin and started on prednisone 40 mg for a five day course. She received Duonebs every 4 hours and placed on 3 L nasal cannula satting at 94-98%. Patient uses oxygen at home at night only. She was continued on her home medications of Dulera and loratidine and was given Mucinex. On day of discharge, she was transitioned to PO levaquin and at room air, her O2 saturation as 91% and while ambulating was 92%. She reported symptomatic improvement over the course of hospitalization and there was still some wheezing on exam on day of discharge. She will finish 2 more days of Levaquin and three more days of prednisone.   Discharge Vitals:   BP 149/91  Pulse 78  Temp(Src) 97.7 F (36.5 C) (Oral)  Resp 18  Ht 5\' 7"  (1.702 m)  Wt 137.893 kg (304 lb)  BMI 47.60 kg/m2  SpO2 93%  Signed: Josiah [name], Med Student 08/27/2014, 1:19 PM   Duwaine Maxin, DO IMTS, PGY2  08/27/2014  Services Ordered on Discharge: None  Equipment Ordered on Discharge: None

## 2014-08-27 NOTE — Progress Notes (Signed)
Subjective: [name] [name] was seen and examined this morning.  She is feeling better.  She ate breakfast and is requesting coffee.    Objective: Vital signs in last 24 hours: Filed Vitals:   08/26/14 0523 08/26/14 1359 08/26/14 2154 08/27/14 0553  BP: 125/83 131/58 142/78 149/91  Pulse: 91 92 93 78  Temp: 98 F (36.7 C) 98.2 F (36.8 C) 98 F (36.7 C) 97.7 F (36.5 C)  TempSrc: Oral Oral Oral Oral  Resp: 18 20 20 18   Height:      Weight:      SpO2: 98% 93% 93% 94%   Weight change:   Intake/Output Summary (Last 24 hours) at 08/27/14 0835 Last data filed at 08/26/14 2113  Gross per 24 hour  Intake    960 ml  Output      0 ml  Net    960 ml   General: sitting up on side of bed in NAD Cardiac: RRR, no rubs, murmurs or gallops Pulm: mild B/L wheeze and rhonchi, moving normal volumes of air Abd: soft, nontender, nondistended, BS present Ext: warm and well perfused, no pedal edema Neuro: alert and oriented X3, responding appropriately  Lab Results: Basic Metabolic Panel:  Recent Labs Lab 08/25/14 1128 08/26/14 0653  NA 139 139  K 3.8 4.0  CL 100 100  CO2 25 24  GLUCOSE 109* 151*  BUN 9 9  CREATININE 0.61 0.51  CALCIUM 8.9 9.4   CBC:  Recent Labs Lab 08/25/14 2113 08/26/14 0653  WBC 4.7 8.4  HGB 15.6* 14.7  HCT 46.1* 43.7  MCV 84.6 86.5  PLT 163 165   Studies/Results: Dg Chest 2 View (if Patient Has Fever And/or Copd)  08/25/2014   CLINICAL DATA:  Shortness of breath and productive cough with lower rib and back pain; history of asthma morbid obesity, and sleep apnea; lung history of heavy tobacco use, discontinued in January of 2014.  EXAM: CHEST  2 VIEW  COMPARISON:  PA and lateral chest of April 30, 2014 an February 01, 2012.  FINDINGS: The lungs are well-expanded. There is no focal infiltrate. The interstitial markings are coarse but stable. The heart and pulmonary vascularity are within the limits of normal. The mediastinum is normal in width. There is no  pleural effusion or pneumothorax. The bony thorax is unremarkable.  IMPRESSION: COPD. There is no evidence of pneumonia nor CHF. One cannot exclude acute bronchitis in the appropriate clinical setting.   Electronically Signed   By: David  Martinique   On: 08/25/2014 13:34   Medications: I have reviewed the patient's current medications. Scheduled Meds: . heparin  5,000 Units Subcutaneous 3 times per day  . ipratropium-albuterol  3 mL Nebulization TID  . levofloxacin (LEVAQUIN) IV  750 mg Intravenous Q24H  . loratadine  10 mg Oral Daily  . mometasone-formoterol  2 puff Inhalation BID  . predniSONE  40 mg Oral Q breakfast  . sertraline  100 mg Oral q morning - 10a  . sodium chloride  3 mL Intravenous Q12H   Continuous Infusions:  PRN Meds:.acetaminophen, ALPRAZolam, guaiFENesin-dextromethorphan, ondansetron (ZOFRAN) IV, ondansetron, traZODone  Assessment/Plan: COPD exacerbation:  No signs and symptoms of pneumonia.  She has improved with bronchodilator therapy, Levaquin and prednisone.  She is requesting something to make her cough more productive.  She feels much better today.  -Continue Dulera, scheduled duonebs, loratadine -change prn Robitussin (she has not requested it) to scheduled Mucinex for cough  -change IV Levaquin to po 750mg  daily (  today is day 3/5); d/c with two days more  -Continue Prednisone 40mg  daily (today is day 2/5); d/c with three days more -O2 supplementation PRN; will check oxygen saturation on and off oxygen supplementation (with ambulation); she was using prn oxygen prior to admission  Depression/Anxiety: At baseline  -Continue home trazodone 150mg  qHS, Zoloft and Xanax  VTE prophylaxis:  Heparin [place] TID  Dispo:  She is stable for discharge home today with follow-up in [facility] next week.   The patient does have a current PCP Bartholomew Crews, MD) and does need an [facility] hospital follow-up appointment after discharge.  The patient does not know have transportation  limitations that hinder transportation to clinic appointments.  .Services Needed at time of discharge: Y = Yes, Blank = No PT:   OT:   RN:   Equipment:   Other:     LOS: 2 days   Francesca Oman, DO 08/27/2014, 8:35 AM

## 2014-08-27 NOTE — Progress Notes (Signed)
CARE MANAGEMENT NOTE 08/27/2014  Patient:  [name] [name], [name] [name]   Account Number:  [id number]  Date Initiated:  08/26/2014  Documentation initiated by:  [facility]  Subjective/Objective Assessment:   COPD exac     Action/Plan:   lives at home with husband, [name] [name]   Anticipated DC Date:  08/28/2014   Anticipated DC Plan:  [place]  CM consult      Choice offered to / List presented to:             Status of service:  Completed, signed off Medicare Important Message given?  NA - LOS <3 / Initial given by admissions (If response is "NO", the following Medicare IM given date fields will be blank) Date Medicare IM given:   Medicare IM given by:   Date Additional Medicare IM given:   Additional Medicare IM given by:    Discharge Disposition:  HOME/SELF CARE  Per UR Regulation:  Reviewed for med. necessity/level of care/duration of stay  If discussed at [place] of Stay Meetings, dates discussed:    Comments:  08/27/14 2059 [name] Bamberger RN, BSN [phone] NCM gave patient a dulera savings trial coupon to use , patient is for dc today.  08/26/2014 1630 NCM spoke to pt and states she had difficulty paying for [facility] in the past. Pt may receive 1 free Dulera inhaler with coupon (if pt has not received in past). Will provide pt with coupon. She is not eligible for copay asisstance, pt has Medicare.  Pt goes to [facility] for anxiety and depression. States she was prescribed Abilify at the [facility] and [place] but she could not afford medication. Explained to discuss with her physician about samples, or follow up with manufacturer for assistance. Currently pt is not prescribed medication at this hospital stay. Pt has RW, nebulizer, and oxygen ( [employer])  at home. Husband to bring portable tank at dc. NCM will continue to follow for any dc needs. [name] Finner RN CCM Case Mgmt phone [phone]

## 2014-08-27 NOTE — Progress Notes (Signed)
Pt seen and examined with Dr. Redmond Pulling. Please refer to resident note for details   Pt feels better today. Still with persistent cough. No CP, no fevers  Exam:  Gen: AAO*3, NAD  CV: RRR, normal heart sounds  Pulm: scattered b/l exp wheezes + (improved)  Abd: soft, non tender, BS +  Ext: no pedal edema   Assessment and Plan:  58 y/o female with acute COPD exacerbation   Acute COPD exacerbation:  - c/w dulera, nebs q 4 hrs  - c/w O2 via [place] prn  - c/w prednisone 40 mg day 2/5  - C/w PO levaquin to complete 5 day course  Pt is stable for d/c home today

## 2014-08-27 NOTE — Progress Notes (Signed)
Subjective: Ms. Bittinger was seen in her room today. She said that she feels about 25% better compared to 10% than yesterday. She walked yesterday without oxygen and felt dyspneic after walking for a bit. She feels that she needs the oxygen to help her breathe better but uses it at night.   Objective: Vital signs in last 24 hours: Filed Vitals:   08/26/14 1359 08/26/14 2154 08/27/14 0553 08/27/14 1023  BP: 131/58 142/78 149/91   Pulse: 92 93 78   Temp: 98.2 F (36.8 C) 98 F (36.7 C) 97.7 F (36.5 C)   TempSrc: Oral Oral Oral   Resp: 20 20 18    Height:      Weight:      SpO2: 93% 93% 94% 93%   Weight change:   Intake/Output Summary (Last 24 hours) at 08/27/14 1139 Last data filed at 08/27/14 0900  Gross per 24 hour  Intake    960 ml  Output      0 ml  Net    960 ml   General Apperance: NAD, anxious  Head: Normocephalic, atraumatic  Eyes: PERRL, EOMI, anicteric sclera  Ears: Normal external ear canal  Nose: Nares normal, septum midline, mucosa normal  Throat: Lips, mucosa and tongue normal  Neck: Supple, trachea midline  Back: No tenderness or bony abnormality  Lungs: Bilateral expiratory wheezes and rhonchi throughout all lung fields  Chest Wall: Posterior chest wall tender to palpation, no deformity  Heart: Regular rate and rhythm, no murmur/rub/gallop  Abdomen: Soft, nontender, nondistended, no rebound/guarding  Extremities: Normal, atraumatic, warm and well perfused, no edema  Pulses: 2+ throughout  Skin: No rashes or lesions  Neurologic: Alert and oriented x 3. Normal strength and sensation  Lab Results: None   Studies/Results: Dg Chest 2 View (if Patient Has Fever And/or Copd)  08/25/2014   CLINICAL DATA:  Shortness of breath and productive cough with lower rib and back pain; history of asthma morbid obesity, and sleep apnea; lung history of heavy tobacco use, discontinued in January of 2014.  EXAM: CHEST  2 VIEW  COMPARISON:  PA and lateral chest of April 30, 2014 an February 01, 2012.  FINDINGS: The lungs are well-expanded. There is no focal infiltrate. The interstitial markings are coarse but stable. The heart and pulmonary vascularity are within the limits of normal. The mediastinum is normal in width. There is no pleural effusion or pneumothorax. The bony thorax is unremarkable.  IMPRESSION: COPD. There is no evidence of pneumonia nor CHF. One cannot exclude acute bronchitis in the appropriate clinical setting.   Electronically Signed   By: David  Martinique   On: 08/25/2014 13:34   Medications:  Scheduled Meds: . dextromethorphan-guaiFENesin  1 tablet Oral BID  . heparin  5,000 Units Subcutaneous 3 times per day  . ipratropium-albuterol  3 mL Nebulization TID  . levofloxacin  750 mg Oral Daily  . loratadine  10 mg Oral Daily  . mometasone-formoterol  2 puff Inhalation BID  . predniSONE  40 mg Oral Q breakfast  . sertraline  100 mg Oral q morning - 10a  . 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 Given her recent URI symptoms, this is likely an exacerbation of her underlying respiratory issue. She received IV Levaquin 750mg  and Solumedrol 125mg  in the ED. She is stable for discharge today.  -continue home Dulera 100-5 mcg 2 puff BID    -Robitussin DM 64mL Q4hr prn cough  -duoneb 81mL q4hr  -IV Levaquin 750mg  daily for 5 day total abx course  -prednisone 40mg  daily for 5 day total steroid course  -loratadine 10mg  daily  -supplemental 2L O2, attempt to wean off later today  -Mucinex DM PRN   Depression/anxiety:  -continue home trazodone 150mg  QHS prn insomnia  -continue home Zoloft 100mg  QAM  -Gave xanax 0.5mg  prn for severe anxiety attack today   FEN: Heart healthy   DVT ppx: subq heparin 5000U TID  This is a Careers information officer Note.  The care of the patient was discussed with Dr. Dareen Piano and Dr. Redmond Pulling and the assessment and plan formulated with their assistance.  Please see their attached note for official documentation of the daily encounter.   LOS: 2 days   Josiah Lobo, Med Student 08/27/2014, 11:39 AM

## 2014-08-27 NOTE — Care Management Note (Signed)
    Page 1 of 1   08/27/2014     10:17:51 AM CARE MANAGEMENT NOTE 08/27/2014  Patient:  [name] [name], [name] [name]   Account Number:  [id number]  Date Initiated:  08/26/2014  Documentation initiated by:  [facility]  Subjective/Objective Assessment:   COPD exac     Action/Plan:   lives at home with husband, [name] [name]   Anticipated DC Date:  08/28/2014   Anticipated DC Plan:  [place]  CM consult      Choice offered to / List presented to:             Status of service:  In process, will continue to follow Medicare Important Message given?  NA - LOS <3 / Initial given by admissions (If response is "NO", the following Medicare IM given date fields will be blank) Date Medicare IM given:   Medicare IM given by:   Date Additional Medicare IM given:   Additional Medicare IM given by:    Discharge Disposition:    Per UR Regulation:    If discussed at Long Length of Stay Meetings, dates discussed:    Comments:  08/26/2014 1630 NCM spoke to pt and states she had difficulty paying for [facility] in the past. Pt may receive 1 free Dulera inhaler with coupon (if pt has not received in past). Will provide pt with coupon. She is not eligible for copay asisstance, pt has Medicare.  Pt goes to [facility] for anxiety and depression. States she was prescribed Abilify at the [facility] and [place] but she could not afford medication. Explained to discuss with her physician about samples, or follow up with manufacturer for assistance. Currently pt is not prescribed medication at this hospital stay. Pt has RW, nebulizer, and oxygen ( [employer])  at home. Husband to bring portable tank at dc. NCM will continue to follow for any dc needs. Jonnie Finner RN CCM Case Mgmt phone [phone]

## 2014-08-27 NOTE — Progress Notes (Signed)
NURSING PROGRESS NOTE  BRAILYN KILLION [id number] Discharge Data: 08/27/2014 4:02 PM Attending Provider: Aldine Contes, MD [facility]:[id number], MD     Earlyne Iba Lyles to be D/C'd Home per MD order.  Discussed with the patient the After Visit Summary and all questions fully answered. All IV's discontinued with no bleeding noted. All belongings returned to patient for patient to take home.   Last Vital Signs:  Blood pressure 119/77, pulse 84, temperature 98.1 F (36.7 C), temperature source Oral, resp. rate 17, height 5\' 7"  (1.702 m), weight 137.893 kg (304 lb), SpO2 94.00%.  Discharge Medication List   Medication List         acetaminophen 500 MG tablet  Commonly known as:  TYLENOL  Take 1,000 mg by mouth every 6 (six) hours as needed for moderate pain (back pain).     albuterol (5 MG/ML) 0.5% nebulizer solution  Commonly known as:  PROVENTIL  Take 2.5 mg by nebulization every 6 (six) hours as needed for wheezing or shortness of breath (wheezing).     albuterol 108 (90 BASE) MCG/ACT inhaler  Commonly known as:  PROVENTIL HFA;VENTOLIN HFA  Inhale 2 puffs into the lungs every 6 (six) hours as needed for wheezing or shortness of breath (wheezing).     ALPRAZolam 0.5 MG tablet  Commonly known as:  XANAX  Take 0.5 mg by mouth at bedtime as needed for anxiety (insomnia).     gabapentin 300 MG capsule  Commonly known as:  NEURONTIN  Take 600 mg by mouth at bedtime.     ipratropium 0.02 % nebulizer solution  Commonly known as:  ATROVENT  Take 0.5 mg by nebulization every 6 (six) hours as needed for wheezing or shortness of breath (wheezing).     levofloxacin 750 MG tablet  Commonly known as:  LEVAQUIN  Take 1 tablet (750 mg total) by mouth daily.  Start taking on:  08/28/2014     mometasone-formoterol 100-5 MCG/ACT Aero  Commonly known as:  DULERA  Inhale 2 puffs into the lungs 2 (two) times daily.     predniSONE 20 MG tablet  Commonly known as:  DELTASONE  Take 2  tablets (40 mg total) by mouth daily with breakfast.  Start taking on:  08/28/2014     sertraline 100 MG tablet  Commonly known as:  ZOLOFT  Take 100 mg by mouth every morning.     traZODone 150 MG tablet  Commonly known as:  DESYREL  Take 150 mg by mouth at bedtime as needed for sleep (insomnia).

## 2014-09-01 ENCOUNTER — Encounter: Payer: Self-pay | Admitting: Internal Medicine

## 2014-09-01 ENCOUNTER — Ambulatory Visit (INDEPENDENT_AMBULATORY_CARE_PROVIDER_SITE_OTHER): Payer: Medicare Other | Admitting: Internal Medicine

## 2014-09-01 VITALS — BP 120/80 | HR 86 | Temp 97.5°F | Ht 67.0 in | Wt 308.5 lb

## 2014-09-01 DIAGNOSIS — J441 Chronic obstructive pulmonary disease with (acute) exacerbation: Secondary | ICD-10-CM

## 2014-09-01 DIAGNOSIS — K635 Polyp of colon: Secondary | ICD-10-CM

## 2014-09-01 DIAGNOSIS — Z0000 Encounter for general adult medical examination without abnormal findings: Secondary | ICD-10-CM

## 2014-09-01 NOTE — Assessment & Plan Note (Addendum)
Completed levaquin and prednisone course from hospital. Feeling better, still expiratory wheezing on physical exam, but less cough per patient and able to bring up white phlegm now. Using less nebulizer treatments and compliant with dulera. o2 sat 93% on room air. Cannot afford cpap.   Advised to call or return to clinic if her symptoms return -of note, she prefers clarithromycin for her severe exacerbations vs. FQs in the future -continue dulera and nebulizer treatment prn or using her albuterol inhaler instead if needed -consider re-evaluating oxygen needs, currently using only at night on future visit but says o2 sat has been 89-90% that could be her baseline?

## 2014-09-01 NOTE — Discharge Summary (Signed)
INTERNAL MEDICINE ATTENDING DISCHARGE COSIGN   I evaluated the patient on the day of discharge and discussed the discharge plan with my resident team. I agree with the discharge documentation and disposition.   Giovannie Scerbo 09/01/2014, 10:46 AM

## 2014-09-01 NOTE — Patient Instructions (Addendum)
General Instructions:  Please bring your medicines with you each time you come to clinic.  Medicines may include prescription medications, over-the-counter medications, herbal remedies, eye drops, vitamins, or other pills.  Dear Ms. Hurtubise,  Keep using your dulera inhaler and you can even try mucinex if you develop congestion.   If you symptoms worsen, please call us and let us know right away [phone]  Please follow up with your pcp on next available  Progress Toward Treatment Goals:  No flowsheet data found.  Self Care Goals & Plans:  No flowsheet data found.  No flowsheet data found.   Care Management & Community Referrals:  Referral 11/30/2012  Referrals made to community resources other (see comments)

## 2014-09-01 NOTE — Assessment & Plan Note (Signed)
Recommended for repeat colonoscopy 05/2013 by Dr. Hilarie Fredrickson but has not been done yet. We discussed it today and she did not wish to have it done at this time.   -would recommend revisiting this topic on next visit.

## 2014-09-01 NOTE — Progress Notes (Signed)
Case discussed with Dr. Qureshi at time of visit.  We reviewed the resident's history and exam and pertinent patient test results.  I agree with the assessment, diagnosis, and plan of care documented in the resident's note. 
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
## 2014-09-01 NOTE — Assessment & Plan Note (Addendum)
Needs repeat colonoscopy per report, last one with poor prep  -please revisit conversation on next visit -due for tdap per epic

## 2015-01-10 ENCOUNTER — Emergency Department (HOSPITAL_COMMUNITY)
Admission: EM | Admit: 2015-01-10 | Discharge: 2015-01-10 | Disposition: A | Discharge status: Home / Self Care | Payer: Medicare HMO | Source: Home / Self Care | Attending: Family Medicine | Admitting: Family Medicine

## 2015-01-10 ENCOUNTER — Encounter (HOSPITAL_COMMUNITY): Payer: Self-pay | Admitting: Emergency Medicine

## 2015-01-10 ENCOUNTER — Emergency Department (INDEPENDENT_AMBULATORY_CARE_PROVIDER_SITE_OTHER): Payer: Medicare HMO

## 2015-01-10 DIAGNOSIS — J45901 Unspecified asthma with (acute) exacerbation: Secondary | ICD-10-CM

## 2015-01-10 MED ORDER — LEVOFLOXACIN 500 MG PO TABS: 500.0000 mg | ORAL_TABLET | Freq: Every day | ORAL | Status: DC

## 2015-01-10 NOTE — ED Notes (Signed)
Patient came to desk, requesting her papers and Rx, which were given to her

## 2015-01-10 NOTE — Discharge Instructions (Signed)
Take all of medicine, drink lots of fluids, use mucinex, see your doctor if further problems

## 2015-01-10 NOTE — ED Notes (Signed)
Pt states that she has had a cough with congestion for over a week.

## 2015-01-10 NOTE — ED Provider Notes (Signed)
CSN: [id number]     Arrival date & time 01/10/15  1036 History   First MD Initiated Contact with Patient 01/10/15 1107     Chief Complaint  Patient presents with  . Cough   (Consider location/radiation/quality/duration/timing/severity/associated sxs/prior Treatment) Patient is a 59 y.o. female presenting with cough. The history is provided by the patient.  Cough Cough characteristics:  Productive Sputum characteristics:  Yellow Severity:  Moderate Onset quality:  Gradual Duration:  1 week Progression:  Worsening Chronicity:  Recurrent Smoker: no   Context: sick contacts and smoke exposure   Context comment:  Husband smokes. Relieved by:  None tried Worsened by:  Nothing tried Ineffective treatments:  None tried Associated symptoms: rhinorrhea and wheezing   Associated symptoms: no chills, no fever and no shortness of breath   Risk factors comment:  Had flu and pneumovax this yr.   Past Medical History  Diagnosis Date  . GERD (gastroesophageal reflux disease)   . Insomnia   . Depression   . Obesity   . Asthma     Chronic, with restrictive component 2/2 obesity  . Sleep apnea     "couldn't afford mask so I didn't get it" (04/30/2014)  . Anxiety   . On home oxygen therapy     "3L at night only when I get sick" (04/30/2014)  . COPD (chronic obstructive pulmonary disease)    Past Surgical History  Procedure Laterality Date  . Vaginal hysterectomy  ~ 1978  . Appendectomy  ~ 2002   Family History  Problem Relation Age of Onset  . Breast cancer Mother   . Breast cancer Maternal Grandmother   . Breast cancer Maternal Aunt   . Alcohol abuse Mother   . Emphysema Mother     smoked  . Asthma Mother    History  Substance Use Topics  . Smoking status: Former Smoker -- 2.00 packs/day for 44 years    Types: Cigarettes    Quit date: 11/07/2012  . Smokeless tobacco: Never Used  . Alcohol Use: No   OB History    No data available     Review of Systems  Constitutional:  Negative.  Negative for fever and chills.  HENT: Positive for congestion, postnasal drip and rhinorrhea.   Respiratory: Positive for cough and wheezing. Negative for shortness of breath.     Allergies  Ibuprofen  Home Medications   Prior to Admission medications   Medication Sig Start Date End Date Taking? Authorizing Provider  acetaminophen (TYLENOL) 500 MG tablet Take 1,000 mg by mouth every 6 (six) hours as needed for moderate pain (back pain).    Historical Provider, MD  albuterol (PROVENTIL HFA;VENTOLIN HFA) 108 (90 BASE) MCG/ACT inhaler Inhale 2 puffs into the lungs every 6 (six) hours as needed for wheezing or shortness of breath (wheezing).    Historical Provider, MD  albuterol (PROVENTIL) (5 MG/ML) 0.5% nebulizer solution Take 2.5 mg by nebulization every 6 (six) hours as needed for wheezing or shortness of breath (wheezing).     Historical Provider, MD  ALPRAZolam Duanne Moron) 0.5 MG tablet Take 0.5 mg by mouth at bedtime as needed for anxiety (insomnia).    Historical Provider, MD  gabapentin (NEURONTIN) 300 MG capsule Take 600 mg by mouth at bedtime.    Historical Provider, MD  ipratropium (ATROVENT) 0.02 % nebulizer solution Take 0.5 mg by nebulization every 6 (six) hours as needed for wheezing or shortness of breath (wheezing).     Historical Provider, MD  levofloxacin (LEVAQUIN) 500  MG tablet Take 1 tablet (500 mg total) by mouth daily. 01/10/15   Billy Fischer, MD  mometasone-formoterol (DULERA) 100-5 MCG/ACT AERO Inhale 2 puffs into the lungs 2 (two) times daily. 08/27/14   Francesca Oman, DO  sertraline (ZOLOFT) 100 MG tablet Take 100 mg by mouth every morning.    Historical Provider, MD  traZODone (DESYREL) 150 MG tablet Take 150 mg by mouth at bedtime as needed for sleep (insomnia).    Historical Provider, MD   BP 135/83 mmHg  Pulse 83  Temp(Src) 97.5 F (36.4 C) (Oral)  Resp 26  SpO2 93% Physical Exam  Constitutional: She is oriented to person, place, and time. She appears  well-developed and well-nourished. No distress.  Neck: Normal range of motion. Neck supple.  Cardiovascular: Normal heart sounds and intact distal pulses.   Pulmonary/Chest: Effort normal. She has decreased breath sounds. She has wheezes. She has rhonchi.  Lymphadenopathy:    She has no cervical adenopathy.  Neurological: She is alert and oriented to person, place, and time.  Skin: Skin is warm and dry.  Nursing note and vitals reviewed.   ED Course  Procedures (including critical care time) Labs Review Labs Reviewed - No data to display  Imaging Review Dg Chest 2 View  01/10/2015   CLINICAL DATA:  Cough and shortness of breath for 1 week.  EXAM: CHEST  2 VIEW  COMPARISON:  Chest radiograph 08/25/2014  FINDINGS: Stable cardiac and mediastinal contours. No consolidative pulmonary opacities. No pleural effusion or pneumothorax. Regional skeleton is unremarkable.  IMPRESSION: No acute cardiopulmonary process.   Electronically Signed   By: Lovey Newcomer M.D.   On: 01/10/2015 12:54     MDM   1. Asthmatic bronchitis with acute exacerbation        Billy Fischer, MD 01/11/15 [phone]

## 2015-01-19 ENCOUNTER — Inpatient Hospital Stay (HOSPITAL_COMMUNITY)
Admission: EM | Admit: 2015-01-19 | Discharge: 2015-01-21 | DRG: 202 | Disposition: A | Discharge status: Home / Self Care | Payer: Medicare HMO | Attending: Internal Medicine | Admitting: Internal Medicine

## 2015-01-19 ENCOUNTER — Emergency Department (HOSPITAL_COMMUNITY): Payer: Medicare HMO

## 2015-01-19 ENCOUNTER — Encounter (HOSPITAL_COMMUNITY): Payer: Self-pay | Admitting: Emergency Medicine

## 2015-01-19 DIAGNOSIS — R739 Hyperglycemia, unspecified: Secondary | ICD-10-CM | POA: Diagnosis present

## 2015-01-19 DIAGNOSIS — J9611 Chronic respiratory failure with hypoxia: Secondary | ICD-10-CM | POA: Diagnosis present

## 2015-01-19 DIAGNOSIS — J45909 Unspecified asthma, uncomplicated: Secondary | ICD-10-CM | POA: Diagnosis present

## 2015-01-19 DIAGNOSIS — J449 Chronic obstructive pulmonary disease, unspecified: Secondary | ICD-10-CM | POA: Diagnosis present

## 2015-01-19 DIAGNOSIS — G4700 Insomnia, unspecified: Secondary | ICD-10-CM | POA: Diagnosis present

## 2015-01-19 DIAGNOSIS — J441 Chronic obstructive pulmonary disease with (acute) exacerbation: Secondary | ICD-10-CM | POA: Diagnosis present

## 2015-01-19 DIAGNOSIS — Z6841 Body Mass Index (BMI) 40.0 and over, adult: Secondary | ICD-10-CM | POA: Diagnosis not present

## 2015-01-19 DIAGNOSIS — F329 Major depressive disorder, single episode, unspecified: Secondary | ICD-10-CM | POA: Diagnosis present

## 2015-01-19 DIAGNOSIS — F32A Depression, unspecified: Secondary | ICD-10-CM | POA: Diagnosis present

## 2015-01-19 DIAGNOSIS — J45901 Unspecified asthma with (acute) exacerbation: Principal | ICD-10-CM | POA: Diagnosis present

## 2015-01-19 DIAGNOSIS — M7989 Other specified soft tissue disorders: Secondary | ICD-10-CM | POA: Diagnosis not present

## 2015-01-19 DIAGNOSIS — E785 Hyperlipidemia, unspecified: Secondary | ICD-10-CM | POA: Diagnosis present

## 2015-01-19 DIAGNOSIS — E662 Morbid (severe) obesity with alveolar hypoventilation: Secondary | ICD-10-CM | POA: Diagnosis present

## 2015-01-19 DIAGNOSIS — Z79899 Other long term (current) drug therapy: Secondary | ICD-10-CM | POA: Diagnosis not present

## 2015-01-19 DIAGNOSIS — G4733 Obstructive sleep apnea (adult) (pediatric): Secondary | ICD-10-CM | POA: Diagnosis present

## 2015-01-19 DIAGNOSIS — Z87891 Personal history of nicotine dependence: Secondary | ICD-10-CM | POA: Diagnosis not present

## 2015-01-19 DIAGNOSIS — F419 Anxiety disorder, unspecified: Secondary | ICD-10-CM | POA: Diagnosis present

## 2015-01-19 DIAGNOSIS — K635 Polyp of colon: Secondary | ICD-10-CM | POA: Diagnosis present

## 2015-01-19 DIAGNOSIS — R05 Cough: Secondary | ICD-10-CM | POA: Diagnosis present

## 2015-01-19 HISTORY — DX: Headache, unspecified: R51.9

## 2015-01-19 HISTORY — DX: Headache: R51

## 2015-01-19 HISTORY — DX: Reserved for inherently not codable concepts without codable children: IMO0001

## 2015-01-19 LAB — BASIC METABOLIC PANEL
ANION GAP: 12 (ref 5–15)
BUN: <5 mg/dL — ABNORMAL LOW (ref 6–23)
CO2: 25 mmol/L (ref 19–32)
Calcium: 9.3 mg/dL (ref 8.4–10.5)
Chloride: 101 mmol/L (ref 96–112)
Creatinine, Ser: 0.66 mg/dL (ref 0.50–1.10)
GFR calc Af Amer: >90 mL/min (ref >90)
GLUCOSE: 136 mg/dL — AB (ref 70–99)
Potassium: 3.7 mmol/L (ref 3.5–5.1)
SODIUM: 138 mmol/L (ref 135–145)

## 2015-01-19 LAB — CBC
HEMATOCRIT: 48.4 % — AB (ref 36.0–46.0)
HEMOGLOBIN: 16.1 g/dL — AB (ref 12.0–15.0)
MCH: 28.6 pg (ref 26.0–34.0)
MCHC: 33.3 g/dL (ref 30.0–36.0)
MCV: 86 fL (ref 78.0–100.0)
Platelets: 180 10*3/uL (ref 150–400)
RBC: 5.63 MIL/uL — ABNORMAL HIGH (ref 3.87–5.11)
RDW: 14.6 % (ref 11.5–15.5)
WBC: 9.5 10*3/uL (ref 4.0–10.5)

## 2015-01-19 LAB — HEPATIC FUNCTION PANEL
ALK PHOS: 102 U/L (ref 39–117)
ALT: 11 U/L (ref 0–35)
AST: 18 U/L (ref 0–37)
Albumin: 3.3 g/dL — ABNORMAL LOW (ref 3.5–5.2)
BILIRUBIN TOTAL: 0.4 mg/dL (ref 0.3–1.2)
Bilirubin, Direct: 0.2 mg/dL (ref 0.0–0.5)
Indirect Bilirubin: 0.2 mg/dL — ABNORMAL LOW (ref 0.3–0.9)
Total Protein: 6.7 g/dL (ref 6.0–8.3)

## 2015-01-19 LAB — I-STAT ARTERIAL BLOOD GAS, ED
Bicarbonate: 24.9 mEq/L — ABNORMAL HIGH (ref 20.0–24.0)
O2 Saturation: 92 %
TCO2: 26 mmol/L (ref 0–100)
pCO2 arterial: 42.3 mmHg (ref 35.0–45.0)
pH, Arterial: 7.378 (ref 7.350–7.450)
pO2, Arterial: 66 mmHg — ABNORMAL LOW (ref 80.0–100.0)

## 2015-01-19 LAB — I-STAT TROPONIN, ED: Troponin i, poc: 0 ng/mL (ref 0.00–0.08)

## 2015-01-19 LAB — LIPID PANEL
CHOL/HDL RATIO: 3.5 ratio
CHOLESTEROL: 184 mg/dL (ref 0–200)
HDL: 53 mg/dL (ref >39)
LDL Cholesterol: 118 mg/dL — ABNORMAL HIGH (ref 0–99)
TRIGLYCERIDES: 64 mg/dL (ref <150)
VLDL: 13 mg/dL (ref 0–40)

## 2015-01-19 LAB — PHOSPHORUS: PHOSPHORUS: 3.3 mg/dL (ref 2.3–4.6)

## 2015-01-19 LAB — MAGNESIUM: Magnesium: 1.9 mg/dL (ref 1.5–2.5)

## 2015-01-19 MED ORDER — METHYLPREDNISOLONE SODIUM SUCC 125 MG IJ SOLR: 125.0000 mg | Freq: Once | INTRAMUSCULAR | Status: DC

## 2015-01-19 MED ORDER — ALBUTEROL (5 MG/ML) CONTINUOUS INHALATION SOLN
10.0000 mg/h | INHALATION_SOLUTION | RESPIRATORY_TRACT | Status: DC
  Administered 2015-01-19: 10 mg/h via RESPIRATORY_TRACT
  Filled 2015-01-19 (×2): qty 20

## 2015-01-19 MED ORDER — ENOXAPARIN SODIUM 80 MG/0.8ML ~~LOC~~ SOLN
70.0000 mg | SUBCUTANEOUS | Status: DC
  Administered 2015-01-20: 70 mg via SUBCUTANEOUS
  Filled 2015-01-19 (×3): qty 0.8

## 2015-01-19 MED ORDER — BENZONATATE 100 MG PO CAPS
100.0000 mg | ORAL_CAPSULE | Freq: Two times a day (BID) | ORAL | Status: DC
  Administered 2015-01-19 – 2015-01-21 (×4): 100 mg via ORAL
  Filled 2015-01-19 (×5): qty 1

## 2015-01-19 MED ORDER — METHYLPREDNISOLONE SODIUM SUCC 125 MG IJ SOLR
125.0000 mg | Freq: Once | INTRAMUSCULAR | Status: AC
  Administered 2015-01-19: 125 mg via INTRAVENOUS
  Filled 2015-01-19: qty 2

## 2015-01-19 MED ORDER — GABAPENTIN 300 MG PO CAPS
600.0000 mg | ORAL_CAPSULE | Freq: Every day | ORAL | Status: DC
  Administered 2015-01-19: 600 mg via ORAL
  Filled 2015-01-19 (×2): qty 2

## 2015-01-19 MED ORDER — ALPRAZOLAM 0.5 MG PO TABS
0.5000 mg | ORAL_TABLET | Freq: Every evening | ORAL | Status: DC | PRN
  Administered 2015-01-20: 0.5 mg via ORAL
  Filled 2015-01-19: qty 1

## 2015-01-19 MED ORDER — ALBUTEROL SULFATE (2.5 MG/3ML) 0.083% IN NEBU: 5.0000 mg | INHALATION_SOLUTION | RESPIRATORY_TRACT | Status: DC | PRN

## 2015-01-19 MED ORDER — IPRATROPIUM BROMIDE 0.02 % IN SOLN
0.5000 mg | Freq: Once | RESPIRATORY_TRACT | Status: AC
  Administered 2015-01-19: 0.5 mg via RESPIRATORY_TRACT
  Filled 2015-01-19: qty 2.5

## 2015-01-19 MED ORDER — SERTRALINE HCL 100 MG PO TABS
100.0000 mg | ORAL_TABLET | Freq: Every morning | ORAL | Status: DC
  Administered 2015-01-20 – 2015-01-21 (×2): 100 mg via ORAL
  Filled 2015-01-19: qty 1
  Filled 2015-01-19: qty 2

## 2015-01-19 MED ORDER — METHYLPREDNISOLONE SODIUM SUCC 125 MG IJ SOLR
60.0000 mg | Freq: Four times a day (QID) | INTRAMUSCULAR | Status: DC
  Administered 2015-01-19 – 2015-01-21 (×7): 60 mg via INTRAVENOUS
  Filled 2015-01-19: qty 2
  Filled 2015-01-19 (×2): qty 0.96
  Filled 2015-01-19: qty 2
  Filled 2015-01-19: qty 0.96
  Filled 2015-01-19 (×2): qty 2
  Filled 2015-01-19: qty 0.96
  Filled 2015-01-19 (×2): qty 2

## 2015-01-19 MED ORDER — ACETAMINOPHEN 325 MG PO TABS
650.0000 mg | ORAL_TABLET | Freq: Four times a day (QID) | ORAL | Status: DC | PRN
  Administered 2015-01-20 – 2015-01-21 (×5): 650 mg via ORAL
  Filled 2015-01-19 (×5): qty 2

## 2015-01-19 MED ORDER — IPRATROPIUM-ALBUTEROL 0.5-2.5 (3) MG/3ML IN SOLN
3.0000 mL | RESPIRATORY_TRACT | Status: DC
  Administered 2015-01-19 – 2015-01-20 (×6): 3 mL via RESPIRATORY_TRACT
  Filled 2015-01-19 (×6): qty 3

## 2015-01-19 MED ORDER — TRAZODONE HCL 150 MG PO TABS
150.0000 mg | ORAL_TABLET | Freq: Every evening | ORAL | Status: DC | PRN
  Administered 2015-01-20: 150 mg via ORAL
  Filled 2015-01-19 (×2): qty 1

## 2015-01-19 MED ORDER — MOMETASONE FURO-FORMOTEROL FUM 100-5 MCG/ACT IN AERO
2.0000 | INHALATION_SPRAY | Freq: Two times a day (BID) | RESPIRATORY_TRACT | Status: DC
  Administered 2015-01-20: 2 via RESPIRATORY_TRACT
  Filled 2015-01-19 (×2): qty 8.8

## 2015-01-19 MED ORDER — ACETAMINOPHEN 650 MG RE SUPP: 650.0000 mg | Freq: Four times a day (QID) | RECTAL | Status: DC | PRN

## 2015-01-19 NOTE — ED Notes (Signed)
RT aware of CAT orders

## 2015-01-19 NOTE — ED Notes (Signed)
Pt denies improvement after breathing treatment. Wheezing noted and increased coughing after treatment. Oxygen sats 85% on room air. Pt laying on side for comfort.

## 2015-01-19 NOTE — Progress Notes (Signed)
RT entered room to place patient on CPAP and patient refused at this time. RT informed patient to have nurse contact RT if she changes her mind.

## 2015-01-19 NOTE — Progress Notes (Signed)
Patient performed Peak flow and was able to reach 225 Liters per minute. Peak flow was left in room with patient for future use pre and post treatments.

## 2015-01-19 NOTE — ED Notes (Signed)
Patient states x 3 weeks ago she started with throat pain, then had nasal congestion, then chest congestion and cough.   Patient states was treated at urgent care for bronchitis.  Patient states no better.   Patient states hot flashes.  Patient states pain in L neck upon palpation.

## 2015-01-19 NOTE — ED Notes (Signed)
PA made aware of oxygen sats

## 2015-01-19 NOTE — ED Provider Notes (Signed)
CSN: [id number]     Arrival date & time 01/19/15  1254 History   First MD Initiated Contact with Patient 01/19/15 1510     Chief Complaint  Patient presents with  . URI  . Neck Pain     (Consider location/radiation/quality/duration/timing/severity/associated sxs/prior Treatment) HPI  [name] [name] is a 59 year old female past medical history of COPD, on O2 when necessary who presents to the ER complaining of shortness of breath, cough, chest congestion. Patient reports 3 weeks ago her symptoms began with sore throat, nasal congestion and cough, she states her congestion in her nose gradually progressed into her chest, her nasal congestion and sore throat have since subsided, and she is now only experiencing persistent productive cough, along with mild dyspnea worse than her baseline. Patient states she was seen and evaluated approximately 10 days ago at urgent care, given Levaquin for her symptoms. She states her cough has since progressed. Patient denies associated dizziness, weakness, chest pain, nausea, vomiting. Patient denies recent travel or leg swelling. Patient states she has tried using her home nebulizer treatment with mild relief.  Past Medical History  Diagnosis Date  . GERD (gastroesophageal reflux disease)   . Insomnia   . Depression   . Obesity   . Asthma     Chronic, with restrictive component 2/2 obesity  . Sleep apnea     "couldn't afford mask so I didn't get it" (04/30/2014)  . Anxiety   . On home oxygen therapy     "3L at night only when I get sick" (04/30/2014)   Past Surgical History  Procedure Laterality Date  . Vaginal hysterectomy  ~ 1978  . Appendectomy  ~ 2002   Family History  Problem Relation Age of Onset  . Breast cancer Mother   . Breast cancer Maternal Grandmother   . Breast cancer Maternal Aunt   . Alcohol abuse Mother   . Emphysema Mother     smoked  . Asthma Mother    History  Substance Use Topics  . Smoking status: Former Smoker -- 0.00  packs/day for 44 years    Quit date: 11/07/2012  . Smokeless tobacco: Never Used  . Alcohol Use: No   OB History    No data available     Review of Systems  Constitutional: Negative for fever.  HENT: Negative for trouble swallowing.   Eyes: Negative for visual disturbance.  Respiratory: Positive for cough, shortness of breath and wheezing.   Cardiovascular: Negative for chest pain.  Gastrointestinal: Negative for nausea, vomiting and abdominal pain.  Genitourinary: Negative for dysuria.  Musculoskeletal: Negative for neck pain.  Skin: Negative for rash.  Neurological: Negative for dizziness, weakness and numbness.  Psychiatric/Behavioral: Negative.       Allergies  Ibuprofen  Home Medications   Prior to Admission medications   Medication Sig Start Date End Date Taking? Authorizing Provider  acetaminophen (TYLENOL) 500 MG tablet Take 1,000 mg by mouth every 6 (six) hours as needed for moderate pain (back pain).   Yes Historical Provider, MD  albuterol (PROVENTIL HFA;VENTOLIN HFA) 108 (90 BASE) MCG/ACT inhaler Inhale 2 puffs into the lungs every 6 (six) hours as needed for wheezing or shortness of breath (wheezing).   Yes Historical Provider, MD  albuterol (PROVENTIL) (5 MG/ML) 0.5% nebulizer solution Take 2.5 mg by nebulization every 6 (six) hours as needed for wheezing or shortness of breath (wheezing).    Yes Historical Provider, MD  ALPRAZolam Duanne Moron) 0.5 MG tablet Take 0.5 mg by mouth at  bedtime as needed for anxiety (insomnia).   Yes Historical Provider, MD  gabapentin (NEURONTIN) 300 MG capsule Take 600 mg by mouth at bedtime.   Yes Historical Provider, MD  mometasone-formoterol (DULERA) 100-5 MCG/ACT AERO Inhale 2 puffs into the lungs 2 (two) times daily. 08/27/14  Yes Francesca Oman, DO  sertraline (ZOLOFT) 100 MG tablet Take 100 mg by mouth every morning.   Yes Historical Provider, MD  traZODone (DESYREL) 150 MG tablet Take 150 mg by mouth at bedtime as needed for sleep  (insomnia).   Yes Historical Provider, MD   BP 106/64 mmHg  Pulse 96  Temp(Src) 99 F (37.2 C) (Oral)  Resp 16  Ht 5\' 8"  (1.727 m)  Wt 320 lb (145.151 kg)  BMI 48.67 kg/m2  SpO2 92% Physical Exam  Constitutional: She is oriented to person, place, and time. She appears well-developed and well-nourished. No distress.  HENT:  Head: Normocephalic and atraumatic.  Mouth/Throat: Oropharynx is clear and moist. No oropharyngeal exudate.  Eyes: EOM are normal. Right eye exhibits no discharge. Left eye exhibits no discharge. No scleral icterus.  Neck: Normal range of motion.  Cardiovascular: Normal rate, regular rhythm and normal heart sounds.   No murmur heard. Pulmonary/Chest: No accessory muscle usage. Tachypnea noted. She is in respiratory distress. She has wheezes in the right upper field, the right middle field, the left upper field and the left middle field.  Mild respiratory distress noted, patient still able to speak in full, clear sentences.   Abdominal: Soft. There is no tenderness.  Musculoskeletal: Normal range of motion. She exhibits no edema or tenderness.  Neurological: She is alert and oriented to person, place, and time. No cranial nerve deficit. Coordination normal.  Skin: Skin is warm and dry. No rash noted. She is not diaphoretic.  Psychiatric: She has a normal mood and affect.  Nursing note and vitals reviewed.   ED Course  Procedures (including critical care time) Labs Review Labs Reviewed  BASIC METABOLIC PANEL - Abnormal; Notable for the following:    Glucose, Bld 136 (*)    BUN <5 (*)    All other components within normal limits  CBC - Abnormal; Notable for the following:    RBC 5.63 (*)    Hemoglobin 16.1 (*)    HCT 48.4 (*)    All other components within normal limits  HEPATIC FUNCTION PANEL - Abnormal; Notable for the following:    Albumin 3.3 (*)    Indirect Bilirubin 0.2 (*)    All other components within normal limits  LIPID PANEL - Abnormal;  Notable for the following:    LDL Cholesterol 118 (*)    All other components within normal limits  I-STAT ARTERIAL BLOOD GAS, ED - Abnormal; Notable for the following:    pO2, Arterial 66.0 (*)    Bicarbonate 24.9 (*)    All other components within normal limits  CULTURE, EXPECTORATED SPUTUM-ASSESSMENT  GRAM STAIN  MRSA PCR SCREENING  MAGNESIUM  PHOSPHORUS  INFLUENZA PANEL BY PCR (TYPE A & B, N6E9)  BASIC METABOLIC PANEL  HEMOGLOBIN A1C  HIV ANTIBODY (ROUTINE TESTING)  HEPATITIS PANEL, ACUTE  CBC WITH DIFFERENTIAL/PLATELET  Randolm Idol, ED    Imaging Review Dg Chest 2 View  01/19/2015   CLINICAL DATA:  59 year old female with 3 week history of cough and shortness of breath. Clinical history includes COPD.  EXAM: CHEST  2 VIEW  COMPARISON:  Prior chest x-ray 01/10/2015  FINDINGS: Cardiac and mediastinal contours remain unchanged. No  focal airspace consolidation, pleural effusion, pulmonary edema or pneumothorax. Mild hyperinflation with diffuse central bronchitic change and interstitial prominence appearing similar to prior.  IMPRESSION: 1. Stable chest x-ray without acute cardiopulmonary process. 2. Chronic parenchymal changes suggest underlying COPD.   Electronically Signed   By: Jacqulynn Cadet M.D.   On: 01/19/2015 15:01     EKG Interpretation   Date/Time:  Monday January 19 2015 13:38:19 EDT Ventricular Rate:  100 PR Interval:  154 QRS Duration: 92 QT Interval:  362 QTC Calculation: 466 R Axis:   -48 Text Interpretation:  Normal sinus rhythm Left anterior fascicular block  Nonspecific ST and T wave abnormality Prolonged QT Abnormal ECG No  significant change since last tracing Confirmed by OTTER  MD, OLGA ([id number])  on 01/19/2015 11:59:59 PM      MDM   Final diagnoses:  COPD exacerbation    Pt here with worsening s/s consistent with COPD exacerbation.  Pt consistently mildly tachypneic and hypoxic despite continuous albuterol tx, and 3L [place] which was titrated up  to 4L to compensate for ABG PO2 of 66 and SpO2 of 88%.  Pt has mildly elevated work of breathing, however can speak in full, clear sentences.  Pt does not show evidence of requiring invasive or noninvasive positive pressure ventilation, is maintaining her airway and has effective respirations. Wells criteria 0, very low concern for PE.  CXR without evidence of PNA or acute abnormality.  Patient admitted to stepdown for COPD exacerbation.  BP 106/64 mmHg  Pulse 96  Temp(Src) 99 F (37.2 C) (Oral)  Resp 16  Ht 5\' 8"  (1.727 m)  Wt 320 lb (145.151 kg)  BMI 48.67 kg/m2  SpO2 92%  Signed,  Dahlia Bailiff, PA-C 2:37 AM   Dahlia Bailiff, PA-C 01/20/15 [id number]  Malvin Johns, MD 01/20/15 1759

## 2015-01-19 NOTE — ED Notes (Signed)
Awaiting MD evaluation.

## 2015-01-19 NOTE — ED Notes (Signed)
Pt given diet coke by provider. Sitting on side of bed. Oxygen increased by PA to 3L on [place]; sats 90%
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
## 2015-01-19 NOTE — ED Notes (Signed)
Admitting MD at the bedside.  

## 2015-01-19 NOTE — ED Notes (Addendum)
Pt c/o increased shortness of breath and neck pain x3 weeks. Swollen glands noted to bilaterally. Pt seen at urgent care and given Levaquin. Completed antibiotics without relief. Mild wheezing noted to bilateral wheezing. Takes home treatment at home for COPD and is also on 3L of oxygen at night as needed. Reports productive cough with green and yellow phlegm. Also c/o fatigue

## 2015-01-19 NOTE — ED Notes (Signed)
Pt given [place] sandwich and diet coke. Pt reports improvement with breathing after second treatment

## 2015-01-20 ENCOUNTER — Encounter (HOSPITAL_COMMUNITY): Payer: Self-pay | Admitting: General Practice

## 2015-01-20 DIAGNOSIS — F329 Major depressive disorder, single episode, unspecified: Secondary | ICD-10-CM

## 2015-01-20 DIAGNOSIS — R739 Hyperglycemia, unspecified: Secondary | ICD-10-CM

## 2015-01-20 DIAGNOSIS — F419 Anxiety disorder, unspecified: Secondary | ICD-10-CM

## 2015-01-20 DIAGNOSIS — M7989 Other specified soft tissue disorders: Secondary | ICD-10-CM

## 2015-01-20 DIAGNOSIS — Z7951 Long term (current) use of inhaled steroids: Secondary | ICD-10-CM

## 2015-01-20 DIAGNOSIS — J4551 Severe persistent asthma with (acute) exacerbation: Secondary | ICD-10-CM

## 2015-01-20 DIAGNOSIS — G4733 Obstructive sleep apnea (adult) (pediatric): Secondary | ICD-10-CM

## 2015-01-20 DIAGNOSIS — Z6841 Body Mass Index (BMI) 40.0 and over, adult: Secondary | ICD-10-CM

## 2015-01-20 DIAGNOSIS — E662 Morbid (severe) obesity with alveolar hypoventilation: Secondary | ICD-10-CM

## 2015-01-20 LAB — CBC WITH DIFFERENTIAL/PLATELET
Basophils Absolute: 0 10*3/uL (ref 0.0–0.1)
Basophils Relative: 0 % (ref 0–1)
EOS PCT: 0 % (ref 0–5)
Eosinophils Absolute: 0 10*3/uL (ref 0.0–0.7)
HCT: 50.6 % — ABNORMAL HIGH (ref 36.0–46.0)
HEMOGLOBIN: 17 g/dL — AB (ref 12.0–15.0)
Lymphocytes Relative: 6 % — ABNORMAL LOW (ref 12–46)
Lymphs Abs: 0.6 10*3/uL — ABNORMAL LOW (ref 0.7–4.0)
MCH: 29.1 pg (ref 26.0–34.0)
MCHC: 33.6 g/dL (ref 30.0–36.0)
MCV: 86.6 fL (ref 78.0–100.0)
Monocytes Absolute: 0.1 10*3/uL (ref 0.1–1.0)
Monocytes Relative: 1 % — ABNORMAL LOW (ref 3–12)
NEUTROS PCT: 93 % — AB (ref 43–77)
Neutro Abs: 9.5 10*3/uL — ABNORMAL HIGH (ref 1.7–7.7)
Platelets: ADEQUATE 10*3/uL (ref 150–400)
RBC: 5.84 MIL/uL — ABNORMAL HIGH (ref 3.87–5.11)
RDW: 14.8 % (ref 11.5–15.5)
WBC: 10.2 10*3/uL (ref 4.0–10.5)

## 2015-01-20 LAB — BASIC METABOLIC PANEL
ANION GAP: 9 (ref 5–15)
BUN: 7 mg/dL (ref 6–23)
CALCIUM: 9.3 mg/dL (ref 8.4–10.5)
CO2: 24 mmol/L (ref 19–32)
Chloride: 107 mmol/L (ref 96–112)
Creatinine, Ser: 0.61 mg/dL (ref 0.50–1.10)
GLUCOSE: 155 mg/dL — AB (ref 70–99)
POTASSIUM: 4.7 mmol/L (ref 3.5–5.1)
SODIUM: 140 mmol/L (ref 135–145)

## 2015-01-20 LAB — HEPATITIS PANEL, ACUTE
HCV AB: NEGATIVE
HEP A IGM: NONREACTIVE
HEP B S AG: NEGATIVE
Hep B C IgM: NONREACTIVE

## 2015-01-20 LAB — INFLUENZA PANEL BY PCR (TYPE A & B)
H1N1 flu by pcr: NOT DETECTED
INFLAPCR: NEGATIVE
INFLBPCR: NEGATIVE

## 2015-01-20 MED ORDER — IPRATROPIUM-ALBUTEROL 0.5-2.5 (3) MG/3ML IN SOLN
3.0000 mL | Freq: Four times a day (QID) | RESPIRATORY_TRACT | Status: DC
  Administered 2015-01-21 (×2): 3 mL via RESPIRATORY_TRACT
  Filled 2015-01-20 (×3): qty 3

## 2015-01-20 MED ORDER — ALBUTEROL (5 MG/ML) CONTINUOUS INHALATION SOLN: 5.0000 mg/h | INHALATION_SOLUTION | RESPIRATORY_TRACT | Status: DC

## 2015-01-20 MED ORDER — GABAPENTIN 600 MG PO TABS
600.0000 mg | ORAL_TABLET | Freq: Every day | ORAL | Status: DC
  Administered 2015-01-20: 600 mg via ORAL
  Filled 2015-01-20 (×2): qty 1

## 2015-01-20 MED ORDER — ALBUTEROL (5 MG/ML) CONTINUOUS INHALATION SOLN
10.0000 mg/h | INHALATION_SOLUTION | Freq: Once | RESPIRATORY_TRACT | Status: AC
  Administered 2015-01-20: 10 mg/h via RESPIRATORY_TRACT

## 2015-01-20 NOTE — Progress Notes (Signed)
*  PRELIMINARY RESULTS* Vascular Ultrasound Lower extremity venous duplex has been completed.  Preliminary findings: no evidence of DVT  Landry Mellow, RDMS, RVT  01/20/2015, 8:32 AM

## 2015-01-20 NOTE — Progress Notes (Signed)
Peak flow was performed before and after treatment. Pre treatment Peak was 150 Liters per min, post treatment peak was 200 Liters per minute.

## 2015-01-20 NOTE — H&P (Signed)
  Date: 01/20/2015  Patient name: [name] [name]  Medical record number: [id number]  Date of birth: August 31, 1956   I have seen and evaluated [name] [name] and discussed their care with the Residency Team.    59 year old lady with history of asthma and obstructive sleep apnea along with depression and anxiety. She states for the past 3 weeks she has had initially symptoms of fatigue shortness of breath sore throat runny nose chills with "hot flashes and diarrhea which have resolved. She had progressive worsening of her pulmonary symptoms with cough productive of yellow sputum. She's been taking albuterol inhalers 3 times a day and Atrovent. She is still taking her Dulera. She had oxygen at home which she has but only has been using when "things get worse". She came to the emergency department where she had evidence of an asthma exacerbation she had blood gas obtained which showed her to have some hypoxemic respiratory failure. She was started on high-dose corticosteroids intravenously and given beta agonist therapy.  Chest x-ray not showing any evidence of pneumonia or infection.  I reviewed past medical history past surgical history surgical history family history and 12 point review of systems. These are been documented in the resident's chart.  Exam:  Vital signs reviewed and present in the chart  General pleasant lady in no acute distress.  Alert oriented 3  HEENT normocephalic atraumatic extraocular movement intact oropharynx clear  Prevascular exam tachycardic no murmurs gallops or rubs heard  Pulm: Diffuse expiratory wheezes with diminished airway movement but not   GI: soft nondistended nontender positive bowel sounds  Extremities trace edema  Neurological exam nonfocal.     Assessment and Plan: I have seen and evaluated the patient as outlined above. I agree with the formulated Assessment and Plan as detailed in the residents' admission note, with the following  changes:   #1 Severe asthma exacerbation:  --continue solumedrol, albuterol and will give her a one hour INH here in ED and then scheduled and PRN SVN  #2 OSA and obesity hypoventilation: needs to wear CPAP at night and needs weight loss program    Truman Hayward, MD 3/15/201612:59 PM

## 2015-01-20 NOTE — ED Notes (Signed)
Pt given diet coke; awaiting bed placement

## 2015-01-20 NOTE — Progress Notes (Signed)
Subjective: Pt feels like breathing has not improved. Complaining of hot flashes and left sided back pain.  Objective: Vital signs in last 24 hours: Filed Vitals:   01/20/15 0800 01/20/15 1125 01/20/15 1128 01/20/15 1200  BP:   106/62 101/65  Pulse: 98  94 104  Temp:      TempSrc:      Resp: 22  22 24   Height:      Weight:      SpO2: 91% 94% 95% 91%   Weight change:  No intake or output data in the 24 hours ending 01/20/15 1211 General: NAD, laying in bed comfortably Lungs: CTAB, no wheezing Cardiac: RRR, no murmurs GI: soft, active bowel sounds, non TTP, obese Neuro: CN II-XII grossly intact  Lab Results: Basic Metabolic Panel:  Recent Labs Lab 01/19/15 1404 01/19/15 2109 01/20/15 0613  NA 138  --  140  K 3.7  --  4.7  CL 101  --  107  CO2 25  --  24  GLUCOSE 136*  --  155*  BUN <5*  --  7  CREATININE 0.66  --  0.61  CALCIUM 9.3  --  9.3  MG  --  1.9  --   PHOS  --  3.3  --    Liver Function Tests:  Recent Labs Lab 01/19/15 2109  AST 18  ALT 11  ALKPHOS 102  BILITOT 0.4  PROT 6.7  ALBUMIN 3.3*   CBC:  Recent Labs Lab 01/19/15 1404 01/20/15 0613  WBC 9.5 10.2  NEUTROABS  --  9.5*  HGB 16.1* 17.0*  HCT 48.4* 50.6*  MCV 86.0 86.6  PLT 180 PLATELET CLUMPS NOTED ON SMEAR, COUNT APPEARS ADEQUATE   Fasting Lipid Panel:  Recent Labs Lab 01/19/15 2109  CHOL 184  HDL 53  LDLCALC 118*  TRIG 64  CHOLHDL 3.5   Studies/Results: Dg Chest 2 View  01/19/2015   CLINICAL DATA:  59 year old female with 3 week history of cough and shortness of breath. Clinical history includes COPD.  EXAM: CHEST  2 VIEW  COMPARISON:  Prior chest x-ray 01/10/2015  FINDINGS: Cardiac and mediastinal contours remain unchanged. No focal airspace consolidation, pleural effusion, pulmonary edema or pneumothorax. Mild hyperinflation with diffuse central bronchitic change and interstitial prominence appearing similar to prior.  IMPRESSION: 1. Stable chest x-ray without acute  cardiopulmonary process. 2. Chronic parenchymal changes suggest underlying COPD.   Electronically Signed   By: Jacqulynn Cadet M.D.   On: 01/19/2015 15:01   Medications: I have reviewed the patient's current medications. Scheduled Meds: . benzonatate  100 mg Oral BID  . enoxaparin (LOVENOX) injection  70 mg Subcutaneous Q24H  . gabapentin  600 mg Oral QHS  . ipratropium-albuterol  3 mL Nebulization Q4H  . methylPREDNISolone (SOLU-MEDROL) injection  60 mg Intravenous Q6H  . mometasone-formoterol  2 puff Inhalation BID  . sertraline  100 mg Oral q morning - 10a   Continuous Infusions: . albuterol Stopped (01/19/15 1732)   PRN Meds:.acetaminophen **OR** acetaminophen, albuterol, ALPRAZolam, traZODone Assessment/Plan: Principal Problem:   Acute asthma exacerbation Active Problems:   Morbid obesity   Hyperplastic colon polyp   Depression   Anxiety disorder   Hyperlipidemia   OSA (obstructive sleep apnea)   Swelling of both lower extremities   Insomnia   Prediabetes   Prolonged Q-T interval on ECG   Acute asthma exacerbation: peak flow 300 this morning, pt does not know what her baseline is.  -Continue DuoNeb's every 4 hours and  nebulizers q2h prn -Continue Dulera 2 puffs twice daily -Start Tessalon 100 mg twice daily for cough -flu neg -hepatitis panel negative and HIV pending - monitor peak flows -Check sputum culture and Gram stain  Obstructive sleep apnea: Diagnosed with sleep apnea per sleep study 05/14/13. Risk factors include her obesity [BMI 48.8]. Hemoglobin 16.1 on admission with baseline 14-16 likely reactive to chronic hypoxia. She reports that she cannot purchase a CPAP. - CPAP qhs - nutrition consulted for need for weight loss.   Hyperglycemia: Glucose 136 on admission. Last A1c 5.9, 05/23/14.  -HbA1c pending  HLD-- LDL 118, can consider starting statin this admission. Will calculate ASCVD score and discuss with patient.   Anxiety: Continue trazodone 150 mg &  Xanax 0.5mg  as needed at bedtime for sleep -Continue Neurontin 600 mg at bedtime   Depression: Continue Zoloft 100 mg  #FEN:  -Diet: Regular  #DVT prophylaxis: Lovenox  #CODE STATUS: FULL CODE -Defer to husband Etta Quill [phone]] if patients lacks decision-making capacity -Confirmed with patient on admission  Dispo: Disposition is deferred at this time, awaiting improvement of current medical problems.   The patient does have a current PCP Bartholomew Crews, MD) and does need an [facility] hospital follow-up appointment after discharge.  The patient does not know have transportation limitations that hinder transportation to clinic appointments. Marland Kitchen  .Services Needed at time of discharge: Y = Yes, Blank = No PT:   OT:   RN:   Equipment:   Other:     LOS: 1 day   Norman Herrlich, MD 01/20/2015, 12:11 PM

## 2015-01-20 NOTE — Progress Notes (Signed)
[name] [name] [id number] Admission Data: 01/20/2015 5:31 PM Attending Provider: Truman Hayward, MD  QPY:[id number], MD Consults/ Treatment Team:    [name] [name] is a 59 y.o. female patient admitted from ED awake, alert  & orientated  X 3,  Full Code, VSS - Blood pressure 133/59, pulse 99, temperature 98 F (36.7 C), temperature source Oral, resp. rate 16, height 5\' 8"  (1.727 m), weight 144.425 kg (318 lb 6.4 oz), SpO2 98 %., O2    4 L nasal cannular, no c/o chest pain, no distress noted.    IV site WDL: Left hand, SL.   Allergies:   Allergies  Allergen Reactions  . Ibuprofen Hives and Swelling     Past Medical History  Diagnosis Date  . GERD (gastroesophageal reflux disease)   . Insomnia   . Depression   . Obesity   . Asthma     Chronic, with restrictive component 2/2 obesity  . Sleep apnea     "couldn't afford mask so I didn't get it" (04/30/2014)  . Anxiety   . On home oxygen therapy     "3L at night only when I get sick" (04/30/2014)      Pt orientation to unit, room and routine. Information packet given to patient/family and safety video watched.  Admission INP armband ID verified with patient/family, and in place. SR up x 2, fall risk assessment complete with Patient and family verbalizing understanding of risks associated with falls. Pt verbalizes an understanding of how to use the call bell and to call for help before getting out of bed.  Skin, clean-dry- intact without evidence of bruising, or skin tears.   No evidence of skin break down noted on exam.     Will cont to monitor and assist as needed.  Dayle Points, RN 01/20/2015 5:31 PM

## 2015-01-21 DIAGNOSIS — Z9981 Dependence on supplemental oxygen: Secondary | ICD-10-CM

## 2015-01-21 DIAGNOSIS — Z87891 Personal history of nicotine dependence: Secondary | ICD-10-CM

## 2015-01-21 DIAGNOSIS — E662 Morbid (severe) obesity with alveolar hypoventilation: Secondary | ICD-10-CM | POA: Diagnosis present

## 2015-01-21 DIAGNOSIS — J45901 Unspecified asthma with (acute) exacerbation: Principal | ICD-10-CM

## 2015-01-21 LAB — HIV ANTIBODY (ROUTINE TESTING W REFLEX): HIV Screen 4th Generation wRfx: NONREACTIVE

## 2015-01-21 LAB — HEMOGLOBIN A1C
HEMOGLOBIN A1C: 6.4 % — AB (ref 4.8–5.6)
Mean Plasma Glucose: 137 mg/dL

## 2015-01-21 MED ORDER — PREDNISONE 20 MG PO TABS: ORAL_TABLET | ORAL | Status: DC

## 2015-01-21 NOTE — Discharge Summary (Signed)
Name: [name] [name] MRN: [id number] DOB: 1956/01/16 59 y.o. PCP: Bartholomew Crews, MD  Date of Admission: 01/19/2015  3:12 PM Date of Discharge: 01/21/2015 Attending Physician: Madilyn Fireman, MD  Discharge Diagnosis: Principal Problem:   Acute asthma exacerbation Active Problems:   Morbid obesity   Hyperplastic colon polyp   Depression   Anxiety disorder   Hyperlipidemia   OSA (obstructive sleep apnea)   Swelling of both lower extremities   Insomnia   Prediabetes   Prolonged Q-T interval on ECG  Discharge Medications:   Medication List    TAKE these medications        acetaminophen 500 MG tablet  Commonly known as:  TYLENOL  Take 1,000 mg by mouth every 6 (six) hours as needed for moderate pain (back pain).     albuterol (5 MG/ML) 0.5% nebulizer solution  Commonly known as:  PROVENTIL  Take 2.5 mg by nebulization every 6 (six) hours as needed for wheezing or shortness of breath (wheezing).     albuterol 108 (90 BASE) MCG/ACT inhaler  Commonly known as:  PROVENTIL HFA;VENTOLIN HFA  Inhale 2 puffs into the lungs every 6 (six) hours as needed for wheezing or shortness of breath (wheezing).     ALPRAZolam 0.5 MG tablet  Commonly known as:  XANAX  Take 0.5 mg by mouth at bedtime as needed for anxiety (insomnia).     gabapentin 300 MG capsule  Commonly known as:  NEURONTIN  Take 600 mg by mouth at bedtime.     mometasone-formoterol 100-5 MCG/ACT Aero  Commonly known as:  DULERA  Inhale 2 puffs into the lungs 2 (two) times daily.     predniSONE 20 MG tablet  Commonly known as:  DELTASONE  Take 60mg  x3 days, 40mg  x 3 days, 20mg  x 3 days and 10mg  x 4 days then stop     sertraline 100 MG tablet  Commonly known as:  ZOLOFT  Take 100 mg by mouth every morning.     traZODone 150 MG tablet  Commonly known as:  DESYREL  Take 150 mg by mouth at bedtime as needed for sleep (insomnia).        Disposition and follow-up:   [name] was discharged  from [facility] in Stable condition.  At the hospital follow up visit please address:  1.  Please ensure pt complete prednisone 60mg  taper.   2.  Labs / imaging needed at time of follow-up: none  3.  Pending labs/ test needing follow-up: none  Procedures Performed:  Dg Chest 2 View  01/19/2015   CLINICAL DATA:  59 year old female with 3 week history of cough and shortness of breath. Clinical history includes COPD.  EXAM: CHEST  2 VIEW  COMPARISON:  Prior chest x-ray 01/10/2015  FINDINGS: Cardiac and mediastinal contours remain unchanged. No focal airspace consolidation, pleural effusion, pulmonary edema or pneumothorax. Mild hyperinflation with diffuse central bronchitic change and interstitial prominence appearing similar to prior.  IMPRESSION: 1. Stable chest x-ray without acute cardiopulmonary process. 2. Chronic parenchymal changes suggest underlying COPD.   Electronically Signed   By: Jacqulynn Cadet M.D.   On: 01/19/2015 15:01   Dg Chest 2 View  01/10/2015   CLINICAL DATA:  Cough and shortness of breath for 1 week.  EXAM: CHEST  2 VIEW  COMPARISON:  Chest radiograph 08/25/2014  FINDINGS: Stable cardiac and mediastinal contours. No consolidative pulmonary opacities. No pleural effusion or pneumothorax. Regional skeleton is unremarkable.  IMPRESSION: No acute  cardiopulmonary process.   Electronically Signed   By: Lovey Newcomer M.D.   On: 01/10/2015 12:54     Admission HPI: Ms. Shave is a 59 year old female with COPD, obesity, depression, anxiety, obstructive sleep apnea who presents with cough.  For the past 3 weeks, she reports an onset of cough productive of yellow sputum which is worse from baseline and has been associated with a general sense of "not feeling well." Other associated symptoms include fatigue, shortness of breath, sore throat, runny nose, chills alternating with "hot flashes," and diarrhea [resolved yesterday]. She was seen in urgent care on 01/10/15 and  given 7 day course of Levaquin which she reports completing but has no improvement in symptoms. Her home medications include albuterol inhaler and Atrovent [both of which she has been using twice daily during this acute interval] and Dulera 2 puffs twice a day. She also reports having 3 L of oxygen at home and was told to use it when things got worse by her former PCP though hasn't used it during this time. She lives at home with her husband and has a pet pug though denies any prior history of allergies. She smoked 2 packs per day for 42 years but has quit since the last year. She also denies any prior history of intubation and reports that she has about 4 flares per year, and this is her first flare of this year. She reports that she received her flu and pneumococcus vaccinations. In the ED, she was given Solu-Medrol 125 mg IV as well as Atrovent and albuterol nebulizer treatments.  Hospital Course by problem list: Principal Problem:   Acute asthma exacerbation Active Problems:   Morbid obesity   Hyperplastic colon polyp   Depression   Anxiety disorder   Hyperlipidemia   OSA (obstructive sleep apnea)   Swelling of both lower extremities   Insomnia   Prediabetes   Prolonged Q-T interval on ECG   Acute asthma exacerbation: Though her smoking history certainly raises the suspicion for COPD, her PFTs on 12/12/12 were notable for FEV1/FVC ratio 0.73, FEV1 48->57% predicted with bronchodilator therapy, severe but reversible airflow obstruction which is more suggestive of asthma given the response to bronchodilator. She also saw Dr. Melvyn Novas [Pulmonology] on 05/28/13 who also confirmed that she likely has asthma with restrictive component secondary to obesity. Chest x-ray without acute infiltrate which is reassuring for no pneumonia Likely trigger for her current exacerbation is probably a viral URI given the runny nose and sore throat she reported. Her pulmonary exam findings on admission were concerning as  airflow was almost absent on auscultation. Initial ABG in the ED with pH 7.378/CO2 42.3/02 66/bicarbonate 24.9 notable for acute hypoxic respiratory failure; no prior ABGs on file. Antibiotics were not given this admission. Duonebs q4h and albuterol q2h prn were given along with solumedrol 60mg  q6h. On discharge solumedrol was transitioned to prednisone 60mg  with 2 week taper.   Discharge Vitals:   BP 137/79 mmHg  Pulse 82  Temp(Src) 97.8 F (36.6 C) (Oral)  Resp 20  Ht 5\' 8"  (1.727 m)  Wt 318 lb 6.4 oz (144.425 kg)  BMI 48.42 kg/m2  SpO2 96%  Discharge Labs:   Results for JEANELLE, DAKE (MRN [id number]) as of 01/22/2015 13:52  Ref. Range 01/20/2015 06:13  Sodium Latest Range: 135-145 mmol/L 140  Potassium Latest Range: 3.5-5.1 mmol/L 4.7  Chloride Latest Range: 96-112 mmol/L 107  CO2 Latest Range: 19-32 mmol/L 24  BUN Latest Range: 6-23 mg/dL 7  Creatinine Latest Range: 0.50-1.10 mg/dL 0.61  Calcium Latest Range: 8.4-10.5 mg/dL 9.3  GFR calc non Af Amer Latest Range: >90 mL/min >90  GFR calc Af Amer Latest Range: >90 mL/min >90  Glucose Latest Range: 70-99 mg/dL 155 (H)  Anion gap Latest Range: 5-15  9    Signed: Norman Herrlich, MD 01/21/2015, 12:56 PM    Services Ordered on Discharge: none Equipment Ordered on Discharge: home oxygen

## 2015-01-21 NOTE — Progress Notes (Addendum)
Subjective: Pt feels like breathing has improved and is ready to go home.  Objective: Vital signs in last 24 hours: Filed Vitals:   01/20/15 2024 01/20/15 2109 01/21/15 0530 01/21/15 0727  BP:  106/45 137/79   Pulse: 101 99 82   Temp:  98 F (36.7 C) 97.8 F (36.6 C)   TempSrc:  Oral Oral   Resp: 18 22 20    Height:      Weight:      SpO2: 98% 94% 94% 96%   Weight change: -1 lb 9.6 oz (-0.726 kg)  Intake/Output Summary (Last 24 hours) at 01/21/15 1106 Last data filed at 01/21/15 1019  Gross per 24 hour  Intake    600 ml  Output    600 ml  Net      0 ml   General: NAD, laying in bed comfortably Lungs: CTAB, no wheezing Cardiac: RRR, no murmurs GI: soft, active bowel sounds, non TTP, obese Neuro: CN II-XII grossly intact  Lab Results: Basic Metabolic Panel:  Recent Labs Lab 01/19/15 1404 01/19/15 2109 01/20/15 0613  NA 138  --  140  K 3.7  --  4.7  CL 101  --  107  CO2 25  --  24  GLUCOSE 136*  --  155*  BUN <5*  --  7  CREATININE 0.66  --  0.61  CALCIUM 9.3  --  9.3  MG  --  1.9  --   PHOS  --  3.3  --    Liver Function Tests:  Recent Labs Lab 01/19/15 2109  AST 18  ALT 11  ALKPHOS 102  BILITOT 0.4  PROT 6.7  ALBUMIN 3.3*   CBC:  Recent Labs Lab 01/19/15 1404 01/20/15 0613  WBC 9.5 10.2  NEUTROABS  --  9.5*  HGB 16.1* 17.0*  HCT 48.4* 50.6*  MCV 86.0 86.6  PLT 180 PLATELET CLUMPS NOTED ON SMEAR, COUNT APPEARS ADEQUATE   Fasting Lipid Panel:  Recent Labs Lab 01/19/15 2109  CHOL 184  HDL 53  LDLCALC 118*  TRIG 64  CHOLHDL 3.5   Studies/Results: Dg Chest 2 View  01/19/2015   CLINICAL DATA:  59 year old female with 3 week history of cough and shortness of breath. Clinical history includes COPD.  EXAM: CHEST  2 VIEW  COMPARISON:  Prior chest x-ray 01/10/2015  FINDINGS: Cardiac and mediastinal contours remain unchanged. No focal airspace consolidation, pleural effusion, pulmonary edema or pneumothorax. Mild hyperinflation with  diffuse central bronchitic change and interstitial prominence appearing similar to prior.  IMPRESSION: 1. Stable chest x-ray without acute cardiopulmonary process. 2. Chronic parenchymal changes suggest underlying COPD.   Electronically Signed   By: Jacqulynn Cadet M.D.   On: 01/19/2015 15:01   Medications: I have reviewed the patient's current medications. Scheduled Meds: . benzonatate  100 mg Oral BID  . enoxaparin (LOVENOX) injection  70 mg Subcutaneous Q24H  . gabapentin  600 mg Oral QHS  . ipratropium-albuterol  3 mL Nebulization QID  . methylPREDNISolone (SOLU-MEDROL) injection  60 mg Intravenous Q6H  . mometasone-formoterol  2 puff Inhalation BID  . sertraline  100 mg Oral q morning - 10a   Continuous Infusions: . albuterol Stopped (01/19/15 1732)   PRN Meds:.acetaminophen **OR** acetaminophen, albuterol, ALPRAZolam, traZODone Assessment/Plan: Principal Problem:   Acute asthma exacerbation Active Problems:   Morbid obesity   Hyperplastic colon polyp   Depression   Anxiety disorder   Hyperlipidemia   OSA (obstructive sleep apnea)   Swelling of both  lower extremities   Insomnia   Prediabetes   Prolonged Q-T interval on ECG   Acute asthma exacerbation: pt feeling better. Will transition to prednisone 60mg  daily x 5 days. Went up to 1575ml on incentive spirometry this morning.  - home oxygen ordered for O2 of 88% when ambulating.   Obstructive sleep apnea: Diagnosed with sleep apnea per sleep study 05/14/13. Risk factors include her obesity [BMI 48.8]. Hemoglobin 16.1 on admission with baseline 14-16 likely reactive to chronic hypoxia. She reports that she cannot purchase a CPAP. - CPAP qhs - nutrition consulted for need for weight loss.   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  On 4L via nasal cannula she was able  to maintain saturations of 95%. - Will discharge home with supplemental O2 at 4L via [place].  Hyperglycemia: Glucose 136 on admission. Last A1c 5.9, 05/23/14.  -HbA1c 6.4  HLD-- LDL 118, can consider starting statin this admission. Will calculate ASCVD score 6.4%, will hold off on starting statin (answered yes for DM as Hba1c found to be 6.4 this admission- not on any home DM meds)  Anxiety: Continue trazodone 150 mg & Xanax 0.5mg  as needed at bedtime for sleep -Continue Neurontin 600 mg at bedtime   Depression: Continue Zoloft 100 mg  #FEN:  -Diet: Regular  #DVT prophylaxis: Lovenox  #CODE STATUS: FULL CODE -Defer to husband Etta Quill [[phone]] if patients lacks decision-making capacity -Confirmed with patient on admission  Dispo: Disposition is deferred at this time, awaiting improvement of current medical problems.   The patient does have a current PCP Bartholomew Crews, MD) and does need an [facility] hospital follow-up appointment after discharge.  The patient does not know have transportation limitations that hinder transportation to clinic appointments. Marland Kitchen  .Services Needed at time of discharge: Y = Yes, Blank = No PT:   OT:   RN:   Equipment:  home o2  Other:     LOS: 2 days   Norman Herrlich, MD 01/21/2015, 11:06 AM  Addendum by Lucious Groves, DO PGY2 01/21/2015 2:18 PM

## 2015-01-21 NOTE — Care Management Note (Signed)
    Page 1 of 1   01/21/2015     2:34:40 PM CARE MANAGEMENT NOTE 01/21/2015  Patient:  [name] [name], [name] [name]   Account Number:  [id number]  Date Initiated:  01/21/2015  Documentation initiated by:  Tomi Bamberger  Subjective/Objective Assessment:   dx asthma ex  admit- lives with spouse.     Action/Plan:   Anticipated DC Date:  01/21/2015   Anticipated DC Plan:  [place]  CM consult      PAC Choice  DURABLE MEDICAL EQUIPMENT  Resumption Of Svcs/PTA Provider   Choice offered to / List presented to:  [name]   DME arranged  [facility]      DME agency  [place].        Status of service:  Completed, signed off Medicare Important Message given?  NA - LOS <3 / Initial given by admissions (If response is "NO", the following Medicare IM given date fields will be blank) Date Medicare IM given:   Medicare IM given by:   Date Additional Medicare IM given:   Additional Medicare IM given by:    Discharge Disposition:  HOME/SELF CARE  Per UR Regulation:  Reviewed for med. necessity/level of care/duration of stay  If discussed at [place] of Stay Meetings, dates discussed:    Comments:  01/21/15 [place], BSN [phone] patient lives with spouse, patient states she has home oxygen with [facility], but now order is for 4 liters, she was on 3 liters previously.  NCM notified Jermaine with [place]. Patient's spouse will bring a oxygen tank for patient.

## 2015-01-21 NOTE — Progress Notes (Signed)
  PROGRESS NOTE MEDICINE TEACHING ATTENDING   Day 2 of stay Patient name: [name] [name]   Medical record number: [id number] Date of birth: [birth year]    Met with patient this morning. She is a 59 year old lady with Asthma (PFTs done 12/12/2012: normal FEV1/FVC with low FVC and improvement in volumes after bronchodilators indicating restrictive lung disease). She is admitted with asthma exacerbation. She was started on IV solumedrol and beta agonist inhalers since admission. Today, she feels better at rest, feels overall better since admission but experiences severe dypsnea on mild day to day activity exertion (without oxygen). Blood pressure 137/79, pulse 82, temperature 97.8 F (36.6 C), temperature source Oral, resp. rate 20, height _0  (1.727 m), weight 318 lb 6.4 oz (144.425 kg), SpO2 96 %. She reports cough with yellow-green sputum production. She is on 4l of oxygen this morning. Her lungs are clear.   Assessment Asthma exacerbation - Much improved clinically today. Reports she is not using oxygen at home although she has a prescription for 3L of oxygen. She might need to be discharged on oxygen and barriers to the use of oxygen at home will need to be discussed. Conversion to PO steroids acceptable. Reviewed Dr Caron Presume note. Agree with plan.   I have discussed the care of this patient with my IM team residents. Please see the resident note for details.  [place], [place] 01/21/2015, 11:27 AM.

## 2015-01-21 NOTE — Discharge Instructions (Signed)
Take prednisone 60mg  for 3 days, then 40mg  for 3 days, 20mg  for 3 days, and 10mg  for 4 days then stop

## 2015-01-21 NOTE — Progress Notes (Signed)
Nsg Discharge Note  Admit Date:  01/19/2015 Discharge date: 01/21/2015   Myeisha Kruser Beeghly to be D/C'd Home per MD order.  AVS completed.  Copy for chart, and copy for patient signed, and dated. Patient/caregiver able to verbalize understanding.  Discharge Medication:   Medication List    TAKE these medications        acetaminophen 500 MG tablet  Commonly known as:  TYLENOL  Take 1,000 mg by mouth every 6 (six) hours as needed for moderate pain (back pain).     albuterol (5 MG/ML) 0.5% nebulizer solution  Commonly known as:  PROVENTIL  Take 2.5 mg by nebulization every 6 (six) hours as needed for wheezing or shortness of breath (wheezing).     albuterol 108 (90 BASE) MCG/ACT inhaler  Commonly known as:  PROVENTIL HFA;VENTOLIN HFA  Inhale 2 puffs into the lungs every 6 (six) hours as needed for wheezing or shortness of breath (wheezing).     ALPRAZolam 0.5 MG tablet  Commonly known as:  XANAX  Take 0.5 mg by mouth at bedtime as needed for anxiety (insomnia).     gabapentin 300 MG capsule  Commonly known as:  NEURONTIN  Take 600 mg by mouth at bedtime.     mometasone-formoterol 100-5 MCG/ACT Aero  Commonly known as:  DULERA  Inhale 2 puffs into the lungs 2 (two) times daily.     predniSONE 20 MG tablet  Commonly known as:  DELTASONE  Take 60mg  x3 days, 40mg  x 3 days, 20mg  x 3 days and 10mg  x 4 days then stop     sertraline 100 MG tablet  Commonly known as:  ZOLOFT  Take 100 mg by mouth every morning.     traZODone 150 MG tablet  Commonly known as:  DESYREL  Take 150 mg by mouth at bedtime as needed for sleep (insomnia).        Discharge Assessment: Filed Vitals:   01/21/15 0530  BP: 137/79  Pulse: 82  Temp: 97.8 F (36.6 C)  Resp: 20   Skin clean, dry and intact without evidence of skin break down, no evidence of skin tears noted. IV catheter discontinued intact. Site without signs and symptoms of complications - no redness or edema noted at insertion site,  patient denies c/o pain - only slight tenderness at site.  Dressing with slight pressure applied.  D/c Instructions-Education: Discharge instructions given to patient/family with verbalized understanding. D/c education completed with patient/family including follow up instructions, medication list, d/c activities limitations if indicated, with other d/c instructions as indicated by MD - patient able to verbalize understanding, all questions fully answered. Patient instructed to return to ED, call 911, or call MD for any changes in condition.  Patient escorted via [place], and D/C home via private auto.  [place]le [place], RN 01/21/2015 4:47 PM

## 2015-01-21 NOTE — Progress Notes (Signed)
SATURATION QUALIFICATIONS: (This note is used to comply with regulatory documentation for home oxygen)  Patient Saturations on Room Air at Rest = 94-95%  Patient Saturations on Room Air while Ambulating = 88%  Patient Saturations on 4 Liters of oxygen while Ambulating = 95%

## 2015-01-23 ENCOUNTER — Encounter: Payer: Self-pay | Admitting: Internal Medicine

## 2015-01-23 ENCOUNTER — Ambulatory Visit (INDEPENDENT_AMBULATORY_CARE_PROVIDER_SITE_OTHER): Payer: Medicare HMO | Admitting: Internal Medicine

## 2015-01-23 ENCOUNTER — Telehealth: Payer: Self-pay | Admitting: *Deleted

## 2015-01-23 VITALS — BP 139/66 | HR 80 | Temp 97.6°F | Wt 321.4 lb

## 2015-01-23 DIAGNOSIS — J4541 Moderate persistent asthma with (acute) exacerbation: Secondary | ICD-10-CM

## 2015-01-23 DIAGNOSIS — Z87891 Personal history of nicotine dependence: Secondary | ICD-10-CM

## 2015-01-23 MED ORDER — CLARITHROMYCIN 250 MG PO TABS: 250.0000 mg | ORAL_TABLET | Freq: Two times a day (BID) | ORAL | Status: AC

## 2015-01-23 MED ORDER — BENZONATATE 100 MG PO CAPS: 100.0000 mg | ORAL_CAPSULE | Freq: Two times a day (BID) | ORAL | Status: DC | PRN
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
## 2015-01-23 NOTE — Telephone Encounter (Signed)
[facility] nurse called [phone] and states pt needs to be seen before 01/29/15 - has green productive cough and some breathing problems. Talked with pt - no change with cough or breathing since recent admission for some problems. Pt aware of appt today with Dr Naaman Plummer 1:15PM. Hilda Blades Annelle Behrendt RN 01/23/15 11:15AM

## 2015-01-23 NOTE — Patient Instructions (Signed)
-  Start taking biaxin 250 mg twice a day for 7 days  -Start taking tessalon pearls twice a day as needed for cough -Finish your prednisone course and keep taking albuterol nebulizer as needed in addition to dulera every day -Use your home oxygen while you are sick -Come back if your symptoms do not improve after antibiotics and steroids, nice seeing you again!   General Instructions:   Please bring your medicines with you each time you come to clinic.  Medicines may include prescription medications, over-the-counter medications, herbal remedies, eye drops, vitamins, or other pills.   Progress Toward Treatment Goals:  No flowsheet data found.  Self Care Goals & Plans:  No flowsheet data found.  No flowsheet data found.   Care Management & Community Referrals:  Referral 11/30/2012  Referrals made to community resources other (see comments)

## 2015-01-24 NOTE — Assessment & Plan Note (Addendum)
Assessment: Pt with persistent asthma with recent hospitalization for acute exacerbation who presents with persistent productive cough.   Plan:  -SpO2 on RA 93%, pt instructed to use home O2 as needed  -Prescribe clarithromycin 250 mg BID for 7 days for probable atypical infection. Pt reports this antibiotic in particular has been effective for her in the past. Rrecent EKG with very mildly prolonged QTc 466). -Prescribe tessalon 100 mg BID PRN cough -Continue dulera 100-5 mcg 2 puffs BID, consider increasing to 200-5 mcg if exacerbations become more frequent   -Continue prednisone taper recently prescribed at hospital discharge -Pt instructed to return if symptoms do not resolve with antibiotic and steroid therapy

## 2015-01-26 NOTE — Progress Notes (Signed)
Internal Medicine Clinic Attending  Case discussed with Dr. Rabbani soon after the resident saw the patient.  We reviewed the resident's history and exam and pertinent patient test results.  I agree with the assessment, diagnosis, and plan of care documented in the resident's note.  

## 2015-01-29 ENCOUNTER — Encounter: Payer: Self-pay | Admitting: Internal Medicine

## 2015-03-18 ENCOUNTER — Ambulatory Visit (INDEPENDENT_AMBULATORY_CARE_PROVIDER_SITE_OTHER): Payer: Commercial Managed Care - HMO | Admitting: Internal Medicine

## 2015-03-18 VITALS — BP 141/78 | HR 82 | Temp 97.8°F | Wt 316.5 lb

## 2015-03-18 DIAGNOSIS — R21 Rash and other nonspecific skin eruption: Secondary | ICD-10-CM | POA: Diagnosis not present

## 2015-03-18 MED ORDER — HYDROXYZINE HCL 10 MG PO TABS: 10.0000 mg | ORAL_TABLET | Freq: Three times a day (TID) | ORAL | Status: DC | PRN
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
## 2015-03-18 NOTE — Assessment & Plan Note (Signed)
This seems to be a stress rash and anxiety related. The patient identifies a clear event that caused significant anxiety and then subsequent upper cough rash. She has had similar outbreaks when she has become increasingly anxious but subsided sooner. She does state that she was emotional for a longer period of time than usual as well. She has no other systemic symptoms or signs to suggest dress syndrome or sezary syndrome. -atarax 10mg  TID prn for itching and anxiety

## 2015-03-18 NOTE — Patient Instructions (Signed)
General Instructions:   Please bring your medicines with you each time you come to clinic.  Medicines may include prescription medications, over-the-counter medications, herbal remedies, eye drops, vitamins, or other pills.   For your rash we will start a pill: Atarax 10mg  you can take up to 3 times a day. If it doesn't get better see [place] right away.    Progress Toward Treatment Goals:  No flowsheet data found.  Self Care Goals & Plans:  No flowsheet data found.  No flowsheet data found.   Care Management & Community Referrals:  Referral 11/30/2012  Referrals made to community resources other (see comments)

## 2015-03-19 NOTE — Progress Notes (Signed)
Internal Medicine Clinic Attending  Case discussed with Dr. Sadek soon after the resident saw the patient.  We reviewed the resident's history and exam and pertinent patient test results.  I agree with the assessment, diagnosis, and plan of care documented in the resident's note. 

## 2015-04-04 DIAGNOSIS — Z886 Allergy status to analgesic agent status: Secondary | ICD-10-CM | POA: Diagnosis not present

## 2015-04-04 DIAGNOSIS — K219 Gastro-esophageal reflux disease without esophagitis: Secondary | ICD-10-CM | POA: Diagnosis not present

## 2015-04-04 DIAGNOSIS — G4700 Insomnia, unspecified: Secondary | ICD-10-CM | POA: Diagnosis not present

## 2015-04-04 DIAGNOSIS — J449 Chronic obstructive pulmonary disease, unspecified: Secondary | ICD-10-CM | POA: Diagnosis not present

## 2015-04-04 DIAGNOSIS — Z6841 Body Mass Index (BMI) 40.0 and over, adult: Secondary | ICD-10-CM | POA: Diagnosis not present

## 2015-04-04 DIAGNOSIS — Z9981 Dependence on supplemental oxygen: Secondary | ICD-10-CM | POA: Diagnosis not present

## 2015-04-04 DIAGNOSIS — F330 Major depressive disorder, recurrent, mild: Secondary | ICD-10-CM | POA: Diagnosis not present

## 2015-04-04 DIAGNOSIS — F419 Anxiety disorder, unspecified: Secondary | ICD-10-CM | POA: Diagnosis not present

## 2015-04-23 ENCOUNTER — Telehealth: Payer: Self-pay | Admitting: Internal Medicine

## 2015-04-23 ENCOUNTER — Telehealth: Payer: Self-pay | Admitting: *Deleted

## 2015-04-23 NOTE — Telephone Encounter (Signed)
Pt has called lela this pm for a referral to a cardiologist, lela ask me to speak to pt, pt states : Chest pain above my breasts for appr 3 weeks These spells happen 3 to 4 times daily When they do i have started to put that pulse machine on my finger and my heart gets high The pain goes from my chest to middle part of my upper back She is ask to go to ED asap, she states she sees my point and will go Monday, again i advised her that she needs to go today to ED and be evaluated because to wait could be dangerous, i advised her on heart attacks and women, warning signs of heart attacks but she insists that she will be fine and she will go Monday, after refusing multiple times Made appt dr ngo for Monday and paged resident on call to have the resident call pt, spoke w/ dr gill and she will call pt

## 2015-04-23 NOTE — Telephone Encounter (Signed)
  Reason for call:   I placed an outgoing call to Ms. Earlyne Iba Bagshaw at ~6PM today per RN Freddy Finner.  She apparently had called the clinic requesting a consult to cardiology due to CP.  Bonnita Nasuti attempted to talk her into coming in to the ED to be evaluated by apparently she refused.  Bonnita Nasuti thought that I may be able to persuade her to come in.    Pt states she has been having chest pain for the past several months that used to occur sporadically but is recurring more frequently.  She describes the pain as pressure that may come on with or without exertion.  She reports the pain radiates to her back and left arm pit.  She also endorses some associated SOB, nausea, and lightheadedness.  States pain lasts for 3-4 minutes and then goes away spontaneously.  Has a h/o GERD but states this doesn't feel like her regular GERD symptoms.  Reports having a stress test several years ago that was "OK."  She is currently not on a baby ASA and has no allergies to ASA.     Assessment/ Plan:   Chest pain: Pt has primarily features primarily that are typical.  I reiterated numerous times that she needed to come to the ED to be evaluated and she states she will come to the clinic on Monday.  She also stated she did not want to come to the ED but would if her symptoms became intolerable.  I instructed her to chew 4 baby ASA or 1 regular ASA if her symptoms returned since she is very resistant to coming in to be evaluated.    As always, pt is advised that if symptoms worsen or new symptoms arise, they should go to an urgent care facility or to to ER for further evaluation.   Jones Bales, MD   04/23/2015, 10:00 PM

## 2015-04-24 NOTE — Telephone Encounter (Signed)
Please see my telephone note. Thanks.

## 2015-04-27 ENCOUNTER — Encounter: Payer: Self-pay | Admitting: Internal Medicine

## 2015-04-27 ENCOUNTER — Ambulatory Visit (INDEPENDENT_AMBULATORY_CARE_PROVIDER_SITE_OTHER): Payer: Commercial Managed Care - HMO | Admitting: Internal Medicine

## 2015-04-27 VITALS — BP 118/97 | HR 93 | Temp 97.6°F | Ht 67.0 in | Wt 329.0 lb

## 2015-04-27 DIAGNOSIS — R079 Chest pain, unspecified: Secondary | ICD-10-CM | POA: Diagnosis not present

## 2015-04-27 DIAGNOSIS — E119 Type 2 diabetes mellitus without complications: Secondary | ICD-10-CM

## 2015-04-27 DIAGNOSIS — F329 Major depressive disorder, single episode, unspecified: Secondary | ICD-10-CM

## 2015-04-27 DIAGNOSIS — F32A Depression, unspecified: Secondary | ICD-10-CM

## 2015-04-27 MED ORDER — SERTRALINE HCL 100 MG PO TABS: 100.0000 mg | ORAL_TABLET | Freq: Every morning | ORAL | Status: DC

## 2015-04-27 MED ORDER — TRAZODONE HCL 150 MG PO TABS: 150.0000 mg | ORAL_TABLET | Freq: Every evening | ORAL | Status: DC | PRN

## 2015-04-27 MED ORDER — ALPRAZOLAM 0.5 MG PO TABS: 0.5000 mg | ORAL_TABLET | Freq: Every evening | ORAL | Status: DC | PRN

## 2015-04-27 NOTE — Progress Notes (Signed)
   Subjective:    Patient ID: [name] [name], female    DOB: [birth year], 59 y.o.   MRN: [id number]  HPI  Patient is a 59 year old with history of hyperlipidemia, depression, obstructive sleep apnea, GERD who presents to clinic for evaluation of chest pain.  Please see problem based charting for more details.  Review of Systems  Constitutional: Negative for fever and chills.  HENT: Negative for rhinorrhea and sore throat.   Eyes: Negative for visual disturbance.  Respiratory: Positive for chest tightness and shortness of breath. Negative for cough.   Cardiovascular: Positive for chest pain. Negative for palpitations.  Gastrointestinal: Negative for nausea, vomiting, abdominal pain, diarrhea, constipation and blood in stool.  Genitourinary: Negative for dysuria and hematuria.  Neurological: Negative for syncope.       Objective:   Physical Exam  Constitutional: She is oriented to person, place, and time. She appears well-developed and well-nourished. No distress.  Obese  HENT:  Head: Normocephalic and atraumatic.  Eyes: EOM are normal. Pupils are equal, round, and reactive to light.  Neck: Normal range of motion. Neck supple. No thyromegaly present.  Cardiovascular: Normal rate and regular rhythm.  Exam reveals no gallop and no friction rub.   No murmur heard. Pulmonary/Chest: Effort normal and breath sounds normal. No respiratory distress. She has no wheezes. She has no rales.  Abdominal: Soft. Bowel sounds are normal. She exhibits no distension. There is no tenderness. There is no rebound.  Musculoskeletal: She exhibits no edema.  Neurological: She is alert and oriented to person, place, and time. No cranial nerve deficit.  Skin: Skin is warm and dry. No rash noted.  Psychiatric: She has a normal mood and affect. Thought content normal.          Assessment & Plan:  Please see problem based charting for more details.

## 2015-04-27 NOTE — Assessment & Plan Note (Signed)
Patient presenting a 3 month history of parasternal chest pressure that radiates to her left axilla. She states that it is worsened with exertion, whether walking long distances or up stairs. She states that the pain is alleviated with rest. She also states that there is some association with stress. She states that it is a 8 out of 10 in severity at its worst. She denies any vomiting or diaphoresis. She denies any history of heart attacks or interventions in the past. She states that she had a very remote stress test many years ago that was negative. Physical exam unremarkable. EKG with no significant changes from prior in March 2016. Patient is currently asymptomatic with no chest pain. -Referral to cardiology for further risk stratification -Patient instructed to immediately be evaluated in the emergency department should her chest pain become worse or associated with any significant vomiting or diaphoresis.

## 2015-04-27 NOTE — Assessment & Plan Note (Signed)
Patient with a recent insurance provider change preventing her from following up with her usual therapist and psychiatrist at the [facility]. -Refilled patient's trazodone, alprazolam, and sertraline for the next month.

## 2015-04-27 NOTE — Patient Instructions (Signed)
We have sent a referral for you to be further evaluated by cardiology to assess your chest pain. Please follow-up with them for further evaluation. If your chest pain were to return or worsen, and if it is associated with any nausea, diaphoresis, vomiting, please seek evaluation in the emergency department immediately.  I have sent a short supply of refills of your medications that were formerly prescribed by the [facility]. Please follow-up with our social worker regarding placement with a new practice for management of these medications.

## 2015-04-28 NOTE — Progress Notes (Signed)
INTERNAL MEDICINE TEACHING ATTENDING ADDENDUM - Verita Kuroda, MD: I reviewed and discussed at the time of visit with the resident Dr. Ngo, the patient's medical history, physical examination, diagnosis and results of pertinent tests and treatment and I agree with the patient's care as documented.  

## 2015-05-04 ENCOUNTER — Telehealth: Payer: Self-pay | Admitting: Licensed Clinical Social Worker

## 2015-05-04 NOTE — Telephone Encounter (Signed)
[name] [name] was referred to CSW for assistance with finding a new behavioral health provider.  [name] [name] was current with [facility] until her insurance changed and they were no longer in-network. Pt was receiving both psychiatric and counseling services.  CSW reviewed [employer] [facility] provider directory and provided [name] [name] with 3 provider groups that are in-network for [employer]:  [facility] and [facility].  [name] [name] would like referral to [facility] based on location.   CSW placed call to [facility], office closed at this time.

## 2015-05-05 NOTE — Telephone Encounter (Signed)
CSW placed call to [facility] to complete referral for [name] [name].  However, [facility] accepts self-referrals only.  Pt will need to call in to initiate services.  CSW placed call to [name] [name], pt notified of above.  CSW provided pt with [facility] contact information.  Pt aware and states she will contact [facility].

## 2015-05-06 ENCOUNTER — Encounter: Payer: Self-pay | Admitting: Licensed Clinical Social Worker

## 2015-05-26 ENCOUNTER — Other Ambulatory Visit: Payer: Self-pay | Admitting: Internal Medicine

## 2015-05-28 DIAGNOSIS — F39 Unspecified mood [affective] disorder: Secondary | ICD-10-CM | POA: Diagnosis not present

## 2015-05-28 DIAGNOSIS — F42 Obsessive-compulsive disorder: Secondary | ICD-10-CM | POA: Diagnosis not present

## 2015-06-01 ENCOUNTER — Other Ambulatory Visit: Payer: Self-pay | Admitting: *Deleted

## 2015-06-01 MED ORDER — TRAZODONE HCL 150 MG PO TABS: 150.0000 mg | ORAL_TABLET | Freq: Every evening | ORAL | Status: DC | PRN

## 2015-06-01 MED ORDER — SERTRALINE HCL 100 MG PO TABS: 100.0000 mg | ORAL_TABLET | Freq: Every morning | ORAL | Status: DC

## 2015-06-01 NOTE — Telephone Encounter (Signed)
Pt will need to transfer refills over to new pysch - [name] [name] referred to [facility]. I will fill for 30 days. To let me know if taking longer to get in with new pysch, I haven't seen her for one yr - has been seeing residents. Pls ask to sch with me next 90 days. Thanks

## 2015-06-01 NOTE — Telephone Encounter (Signed)
Called pt to schedule appt, she was given appt 9/8 at 0815 with dr Lynnae January, she also states she does not need refills on these meds that she just got them filled saturday by her new psychiatrist, her appt with him was Thursday and she has a f/u on appr 8/26, triage nurse called [facility] and cancelled the scripts from dr Software engineer and confirmed that pt did get these filled Saturday, the tech states the computer sent the request for refills

## 2015-06-05 DIAGNOSIS — F39 Unspecified mood [affective] disorder: Secondary | ICD-10-CM | POA: Diagnosis not present

## 2015-06-06 DIAGNOSIS — J449 Chronic obstructive pulmonary disease, unspecified: Secondary | ICD-10-CM | POA: Diagnosis not present

## 2015-06-25 DIAGNOSIS — F39 Unspecified mood [affective] disorder: Secondary | ICD-10-CM | POA: Diagnosis not present

## 2015-07-06 ENCOUNTER — Ambulatory Visit (INDEPENDENT_AMBULATORY_CARE_PROVIDER_SITE_OTHER): Payer: Commercial Managed Care - HMO | Admitting: Cardiovascular Disease

## 2015-07-06 ENCOUNTER — Encounter: Payer: Self-pay | Admitting: Cardiovascular Disease

## 2015-07-06 VITALS — BP 114/74 | HR 75 | Ht 67.0 in | Wt 303.0 lb

## 2015-07-06 DIAGNOSIS — R079 Chest pain, unspecified: Secondary | ICD-10-CM | POA: Diagnosis not present

## 2015-07-06 DIAGNOSIS — R0789 Other chest pain: Secondary | ICD-10-CM | POA: Diagnosis not present

## 2015-07-06 DIAGNOSIS — F411 Generalized anxiety disorder: Secondary | ICD-10-CM | POA: Diagnosis not present

## 2015-07-06 NOTE — Patient Instructions (Signed)
Medication Instructions:  Your physician recommends that you continue on your current medications as directed. Please refer to the Current Medication list given to you today.   Labwork: NONE  Testing/Procedures: NONE  Follow-Up: Follow up with Dr. Acie Fredrickson as needed.  Any Other Special Instructions Will Be Listed Below (If Applicable).

## 2015-07-06 NOTE — Progress Notes (Signed)
Cardiology Office Note   Date:  07/06/2015   ID:  [name] [name], DOB 10-11-1956, MRN [id number]  PCP:  Aletta Edouard, MD  Cardiologist:   Vesta Mixer, MD   Chief Complaint  Patient presents with  . Chest Pain   Problem List 1. Chest pain 2. Asthma 3. Obesity  4. OSA - not on CPAP , could not afford it .    History of Present Illness: [name] [name] is a 59 y.o. female who presents for chest pain   For the past 6 months . intermittant chest pressure / pain  Lasts for 7-8 minutes  Not brought on by exertion. Sometimes brought on by deep breath Has occurred while carrying in groceries.   Does not get any exercise, is on disability , previouisly worked in [employer].   They resolve spontaneously .   No radiation to back or to arms   Past Medical History  Diagnosis Date  . GERD (gastroesophageal reflux disease)   . Insomnia   . Depression   . Obesity   . Asthma     Chronic, with restrictive component 2/2 obesity  . Sleep apnea     "couldn't afford mask so I didn't get it" (04/30/2014)  . Anxiety   . On home oxygen therapy     "3L at night only when I get sick" (04/30/2014)  . Shortness of breath dyspnea   . Headache     Past Surgical History  Procedure Laterality Date  . Vaginal hysterectomy  ~ 1978  . Appendectomy  ~ 2002     Current Outpatient Prescriptions  Medication Sig Dispense Refill  . acetaminophen (TYLENOL) 500 MG tablet Take 1,000 mg by mouth every 6 (six) hours as needed for moderate pain (back pain).    Marland Kitchen albuterol (PROVENTIL HFA;VENTOLIN HFA) 108 (90 BASE) MCG/ACT inhaler Inhale 2 puffs into the lungs every 6 (six) hours as needed for wheezing or shortness of breath (wheezing).    Marland Kitchen albuterol (PROVENTIL) (5 MG/ML) 0.5% nebulizer solution Take 2.5 mg by nebulization every 6 (six) hours as needed for wheezing or shortness of breath (wheezing).     . ALPRAZolam (XANAX) 0.5 MG tablet Take 1 tablet (0.5 mg total) by mouth at bedtime  as needed for anxiety (insomnia). 30 tablet 0  . buPROPion (WELLBUTRIN XL) 150 MG 24 hr tablet Take 1 tablet by mouth every morning.  0  . gabapentin (NEURONTIN) 300 MG capsule Take 600 mg by mouth at bedtime.    . mometasone-formoterol (DULERA) 100-5 MCG/ACT AERO Inhale 2 puffs into the lungs 2 (two) times daily. 1 Inhaler 2  . sertraline (ZOLOFT) 100 MG tablet Take 100 mg by mouth 2 (two) times daily.    . traZODone (DESYREL) 150 MG tablet Take 1 tablet (150 mg total) by mouth at bedtime as needed for sleep (insomnia). 30 tablet 0   No current facility-administered medications for this visit.    Allergies:   Ibuprofen    Social History:  The patient  reports that she quit smoking about 2 years ago. She has never used smokeless tobacco. She reports that she does not drink alcohol or use illicit drugs.   Family History:  The patient's family history includes Alcohol abuse in her mother; Asthma in her mother; Breast cancer in her maternal aunt, maternal grandmother, and mother; Emphysema in her mother.    ROS:  Please see the history of present illness.    Review of Systems: Constitutional:  denies  fever, chills, diaphoresis, appetite change and fatigue.  HEENT: denies photophobia, eye pain, redness, hearing loss, ear pain, congestion, sore throat, rhinorrhea, sneezing, neck pain, neck stiffness and tinnitus.  Respiratory: denies SOB, DOE, cough, chest tightness, and wheezing.  Cardiovascular: admits to chest pain, , denies palpitations and leg swelling.  Gastrointestinal: denies nausea, vomiting, abdominal pain, diarrhea, constipation, blood in stool.  Genitourinary: denies dysuria, urgency, frequency, hematuria, flank pain and difficulty urinating.  Musculoskeletal: denies  myalgias, back pain, joint swelling, arthralgias and gait problem.   Skin: denies pallor, rash and wound.  Neurological: denies dizziness, seizures, syncope, weakness, light-headedness, numbness and headaches.     Hematological: denies adenopathy, easy bruising, personal or family bleeding history.  Psychiatric/ Behavioral: denies suicidal ideation, mood changes, confusion, nervousness, sleep disturbance and agitation.       All other systems are reviewed and negative.    PHYSICAL EXAM: VS:  BP 114/74 mmHg  Pulse 75  Ht 5\' 7"  (1.702 m)  Wt 137.44 kg (303 lb)  BMI 47.45 kg/m2 , BMI Body mass index is 47.45 kg/(m^2). GEN: Well nourished, well developed, in no acute distress HEENT: normal Neck: no JVD, carotid bruits, or masses Cardiac: RRR; no murmurs, rubs, or gallops,no edema  Respiratory:very mild wheezing  GI: soft, , morbid obesity  MS: no deformity or atrophy Skin: warm and dry, no rash Neuro:  Strength and sensation are intact Psych: normal   EKG:  EKG is ordered today. The ekg ordered today demonstrates NSR  At 75 , inc RBBB .    Recent Labs: 01/19/2015: ALT 11; Magnesium 1.9 01/20/2015: BUN 7; Creatinine, Ser 0.61; Hemoglobin 17.0*; Platelets PLATELET CLUMPS NOTED ON SMEAR, COUNT APPEARS ADEQUATE; Potassium 4.7; Sodium 140    Lipid Panel    Component Value Date/Time   CHOL 184 01/19/2015 2109   TRIG 64 01/19/2015 2109   HDL 53 01/19/2015 2109   CHOLHDL 3.5 01/19/2015 2109   VLDL 13 01/19/2015 2109   LDLCALC 118* 01/19/2015 2109      Wt Readings from Last 3 Encounters:  07/06/15 137.44 kg (303 lb)  04/27/15 149.233 kg (329 lb)  03/18/15 143.563 kg (316 lb 8 oz)      Other studies Reviewed: Additional studies/ records that were reviewed today include: . Review of the above records demonstrates:    ASSESSMENT AND PLAN:  1.  Chest pain: The patient presents with atypical episodes of chest pain. These episodes occur at rest. They may also occur with exertion but they typically occur when she's stressed out. She thinks that the episodes are related to stress and I agree.  She's not able to exercise on a treadmill. She's had a Myoview study in the past which was  normal.  At this point I think that we can reassure her that her symptoms are very atypical. I do not think that she needs any further testing.  2. Morbid obesity: The patient has been advised to lose weight. We'll have her follow-up with her medical doctor.   Current medicines are reviewed at length with the patient today.  The patient does not have concerns regarding medicines.  The following changes have been made:  no change  Labs/ tests ordered today include:  No orders of the defined types were placed in this encounter.     Disposition:   FU with me as needed.      Al Bracewell, Deloris Ping, MD  07/06/2015 3:06 PM    [facility] [address] [place],  [place], [place]  [address] Phone: [phone]; Fax: [phone]   [facility]  [address] [place], [place]  [address] [phone]   Fax [phone]

## 2015-07-16 ENCOUNTER — Encounter: Payer: Self-pay | Admitting: Internal Medicine

## 2015-08-13 ENCOUNTER — Ambulatory Visit (INDEPENDENT_AMBULATORY_CARE_PROVIDER_SITE_OTHER): Payer: Commercial Managed Care - HMO | Admitting: Internal Medicine

## 2015-08-13 VITALS — BP 121/81 | HR 77 | Temp 98.1°F | Wt 292.8 lb

## 2015-08-13 DIAGNOSIS — J9610 Chronic respiratory failure, unspecified whether with hypoxia or hypercapnia: Secondary | ICD-10-CM

## 2015-08-13 DIAGNOSIS — F32A Depression, unspecified: Secondary | ICD-10-CM

## 2015-08-13 DIAGNOSIS — J4520 Mild intermittent asthma, uncomplicated: Secondary | ICD-10-CM

## 2015-08-13 DIAGNOSIS — G4733 Obstructive sleep apnea (adult) (pediatric): Secondary | ICD-10-CM

## 2015-08-13 DIAGNOSIS — R634 Abnormal weight loss: Secondary | ICD-10-CM

## 2015-08-13 DIAGNOSIS — G4761 Periodic limb movement disorder: Secondary | ICD-10-CM

## 2015-08-13 DIAGNOSIS — D72829 Elevated white blood cell count, unspecified: Secondary | ICD-10-CM | POA: Diagnosis not present

## 2015-08-13 DIAGNOSIS — E662 Morbid (severe) obesity with alveolar hypoventilation: Secondary | ICD-10-CM

## 2015-08-13 DIAGNOSIS — J9611 Chronic respiratory failure with hypoxia: Secondary | ICD-10-CM

## 2015-08-13 DIAGNOSIS — F329 Major depressive disorder, single episode, unspecified: Secondary | ICD-10-CM

## 2015-08-13 DIAGNOSIS — Z23 Encounter for immunization: Secondary | ICD-10-CM | POA: Diagnosis not present

## 2015-08-13 DIAGNOSIS — G729 Myopathy, unspecified: Secondary | ICD-10-CM | POA: Diagnosis not present

## 2015-08-13 DIAGNOSIS — M6289 Other specified disorders of muscle: Secondary | ICD-10-CM

## 2015-08-13 DIAGNOSIS — J4541 Moderate persistent asthma with (acute) exacerbation: Secondary | ICD-10-CM

## 2015-08-13 DIAGNOSIS — Z0000 Encounter for general adult medical examination without abnormal findings: Secondary | ICD-10-CM

## 2015-08-13 LAB — GLUCOSE, CAPILLARY: Glucose-Capillary: 106 mg/dL — ABNORMAL HIGH (ref 65–99)

## 2015-08-13 LAB — POCT GLYCOSYLATED HEMOGLOBIN (HGB A1C): Hemoglobin A1C: 5.5

## 2015-08-13 NOTE — Patient Instructions (Signed)
1. I am doing blood work to see why your legs hurt in the AM 2. I will mail you your blood work 3. Complete the stool cards (colon cancer screening) and mail back in 4. Stop the [facility] for now - save as might restart 5. Use albuterol as rescue inhaler 6. I am looking into the sleep study.

## 2015-08-13 NOTE — Assessment & Plan Note (Addendum)
Had sleep study and titration in 05/2013. She states insurance would not pay for CPAP. After reviewing the sleep results, I think that was bc was mild with AHI of 6.1. But did desat to 84%. Likely will need repeat sleep study but will discuss with [facility]. Weight loss will help and pt is unintentionally losing weight. Might just need O2 if still mild OSA.

## 2015-08-13 NOTE — Assessment & Plan Note (Signed)
She is using her Dulera PRN. Using her albuterol MDI about once monthly. No cough. No SOB if walks slow. If walks fast - gets DOE. Not using her home O2 - notes indicated was to use it QHS. Seems to get flares when gets a resp virus. 2 admits past 12 months for resp flares. Reviewed Dr Gustavus Bryant notes and PFT's. He wanted her on Dulera to maximize lung fxn. She is not using it. Pul status OK today.   ASSESSMENT : well controlled intermittent asthma (DOE is likely multifactorial)  PLAN 1. Stop dulera since using PRN 2. Alb PRN for rescue inhaler 3. Will discuss repeating PFT's next appt 4. Will see if can tx OSA

## 2015-08-13 NOTE — Assessment & Plan Note (Signed)
Was rec to use nocturnal O2 but isn't. Further investigation planned.

## 2015-08-13 NOTE — Progress Notes (Signed)
   Subjective:    Patient ID: [name] [name], female    DOB: 09-14-1956, 59 y.o.   MRN: [id number]  HPI  [name] [name] is here for asthma F/U. Please see the A&P for the status of the pt's chronic medical problems.  Review of Systems  Constitutional: Positive for appetite change and unexpected weight change.  Respiratory: Positive for shortness of breath. Negative for cough.   Cardiovascular: Negative for chest pain and leg swelling.  Gastrointestinal: Negative for blood in stool and anal bleeding.  Musculoskeletal: Positive for myalgias. Negative for gait problem.  Skin:       SK on back x 3. Skin tags on neck  Neurological: Negative for weakness, light-headedness and headaches.  Psychiatric/Behavioral: Negative for behavioral problems. The patient is nervous/anxious.        Objective:   Physical Exam  Constitutional: She is oriented to person, place, and time. She appears well-developed and well-nourished. No distress.  HENT:  Head: Normocephalic and atraumatic.  Right Ear: External ear normal.  Left Ear: External ear normal.  Nose: Nose normal.  Eyes: Conjunctivae and EOM are normal. Right eye exhibits no discharge. Left eye exhibits no discharge. No scleral icterus.  Cardiovascular: Normal rate, regular rhythm and normal heart sounds.   +2 DP pulses B  Pulmonary/Chest: Effort normal and breath sounds normal. No respiratory distress. She has no wheezes.  Musculoskeletal: She exhibits no edema or tenderness.  Neurological: She is alert and oriented to person, place, and time.  Skin: Skin is warm and dry. She is not diaphoretic.  Sk on back x 3. Skin tags on neck  Psychiatric: She has a normal mood and affect. Her behavior is normal. Judgment and thought content normal.          Assessment & Plan:

## 2015-08-13 NOTE — Assessment & Plan Note (Signed)
Flu shot today.  Reviewed colon 2013 - poor prep. She doesn't want another colon but willing to do stool cards and understands that if abnl, I would rec repeat colon.  Is s/p TAH for uterine cancer - need to research.  Repeat A1C. Most recent was 6.4

## 2015-08-13 NOTE — Assessment & Plan Note (Signed)
She is seeing a therapist at [facility] and is happy there. She is on Zoloft and Wellbutrin along with Xanax PRN. She still has panic attacks but there are triggers - getting into a car, her dog escaping out the door, etc. She is pleased with her progress.
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
## 2015-08-13 NOTE — Assessment & Plan Note (Signed)
I do not know if this is accurate. Her bicarb was nl. She is obese and has h/o nocturnal hypoxia.

## 2015-08-14 ENCOUNTER — Encounter: Payer: Self-pay | Admitting: Internal Medicine

## 2015-08-14 DIAGNOSIS — G4761 Periodic limb movement disorder: Secondary | ICD-10-CM | POA: Insufficient documentation

## 2015-08-14 LAB — CBC
HEMATOCRIT: 44.6 % (ref 34.0–46.6)
HEMOGLOBIN: 15.2 g/dL (ref 11.1–15.9)
MCH: 28.9 pg (ref 26.6–33.0)
MCHC: 34.1 g/dL (ref 31.5–35.7)
MCV: 85 fL (ref 79–97)
Platelets: 172 10*3/uL (ref 150–379)
RBC: 5.26 x10E6/uL (ref 3.77–5.28)
RDW: 15.3 % (ref 12.3–15.4)
WBC: 7.7 10*3/uL (ref 3.4–10.8)

## 2015-08-14 LAB — CMP14 + ANION GAP
ALT: 9 IU/L (ref 0–32)
ANION GAP: 17 mmol/L (ref 10.0–18.0)
AST: 12 IU/L (ref 0–40)
Albumin/Globulin Ratio: 2.1 (ref 1.1–2.5)
Albumin: 4.1 g/dL (ref 3.5–5.5)
Alkaline Phosphatase: 116 IU/L (ref 39–117)
BUN/Creatinine Ratio: 11 (ref 9–23)
BUN: 6 mg/dL (ref 6–24)
Bilirubin Total: 0.3 mg/dL (ref 0.0–1.2)
CALCIUM: 9.2 mg/dL (ref 8.7–10.2)
CO2: 23 mmol/L (ref 18–29)
CREATININE: 0.56 mg/dL — AB (ref 0.57–1.00)
Chloride: 100 mmol/L (ref 97–108)
GFR, EST AFRICAN AMERICAN: 118 mL/min/{1.73_m2} (ref >59)
GFR, EST NON AFRICAN AMERICAN: 102 mL/min/{1.73_m2} (ref >59)
GLOBULIN, TOTAL: 2 g/dL (ref 1.5–4.5)
Glucose: 95 mg/dL (ref 65–99)
POTASSIUM: 4.1 mmol/L (ref 3.5–5.2)
Sodium: 140 mmol/L (ref 134–144)
TOTAL PROTEIN: 6.1 g/dL (ref 6.0–8.5)

## 2015-08-14 LAB — VITAMIN B12: Vitamin B-12: 364 pg/mL (ref 211–946)

## 2015-08-14 LAB — TSH: TSH: 2.43 u[IU]/mL (ref 0.450–4.500)

## 2015-08-14 LAB — SEDIMENTATION RATE: Sed Rate: 23 mm/hr (ref 0–40)

## 2015-08-14 MED ORDER — ROPINIROLE HCL 0.25 MG PO TABS: ORAL_TABLET | ORAL | Status: DC

## 2015-08-14 NOTE — Addendum Note (Signed)
Addended by: Larey Dresser A on: 08/14/2015 12:17 PM   Modules accepted: Orders

## 2015-08-20 DIAGNOSIS — F39 Unspecified mood [affective] disorder: Secondary | ICD-10-CM | POA: Diagnosis not present

## 2015-11-22 ENCOUNTER — Other Ambulatory Visit: Payer: Self-pay | Admitting: Internal Medicine

## 2015-11-24 NOTE — Telephone Encounter (Signed)
Called pt. Verified that med was helping and she stated she only needed one at night.

## 2015-12-07 DIAGNOSIS — J449 Chronic obstructive pulmonary disease, unspecified: Secondary | ICD-10-CM | POA: Diagnosis not present

## 2015-12-14 DIAGNOSIS — F39 Unspecified mood [affective] disorder: Secondary | ICD-10-CM | POA: Diagnosis not present

## 2015-12-17 ENCOUNTER — Encounter: Payer: Self-pay | Admitting: Internal Medicine

## 2015-12-17 ENCOUNTER — Ambulatory Visit (INDEPENDENT_AMBULATORY_CARE_PROVIDER_SITE_OTHER): Payer: Commercial Managed Care - HMO | Admitting: Internal Medicine

## 2015-12-17 VITALS — BP 111/71 | HR 76 | Temp 97.9°F | Ht 67.0 in | Wt 276.9 lb

## 2015-12-17 DIAGNOSIS — M79674 Pain in right toe(s): Secondary | ICD-10-CM | POA: Insufficient documentation

## 2015-12-17 DIAGNOSIS — B354 Tinea corporis: Secondary | ICD-10-CM

## 2015-12-17 DIAGNOSIS — L304 Erythema intertrigo: Secondary | ICD-10-CM

## 2015-12-17 DIAGNOSIS — R21 Rash and other nonspecific skin eruption: Secondary | ICD-10-CM | POA: Insufficient documentation

## 2015-12-17 MED ORDER — NYSTATIN 100000 UNIT/GM EX POWD: CUTANEOUS | Status: DC

## 2015-12-17 MED ORDER — CYCLOBENZAPRINE HCL 5 MG PO TABS: 5.0000 mg | ORAL_TABLET | Freq: Three times a day (TID) | ORAL | Status: DC | PRN

## 2015-12-17 MED ORDER — FLUCONAZOLE 200 MG PO TABS: 200.0000 mg | ORAL_TABLET | ORAL | Status: DC

## 2015-12-17 NOTE — Progress Notes (Signed)
Subjective:   Patient ID: [name] [name] female   DOB: Sep 12, 1956 60 y.o.   MRN: [id number]  HPI: Ms. [name] [name] is a 60 y.o. female w/ PMHx of GERD, depression, OSA, anxiety, and ?asthma, presents to the clinic today for an acute visit for rash. Patient says she has this rash on her forearms bilaterally, started about 1 week ago, mild itching, redness, present in small circles. No recent fever, chills, nausea, joint pain, abdominal pain, or upper respiratory symptoms. Also states she has a different rash under her breasts bilaterally which she has had in the past.   Her last complaint is that her toes hurt on her right foot and feel like they are cramping at night. She has had this before, related to standing for a prolonged period of time at work. No erythema or tenderness.   Past Medical History  Diagnosis Date  . GERD (gastroesophageal reflux disease)   . Insomnia   . Depression   . Obesity   . Asthma     Chronic, with restrictive component 2/2 obesity  . Sleep apnea     "couldn't afford mask so I didn't get it" (04/30/2014)  . Anxiety   . On home oxygen therapy     "3L at night only when I get sick" (04/30/2014)  . Shortness of breath dyspnea   . Headache    Current Outpatient Prescriptions  Medication Sig Dispense Refill  . acetaminophen (TYLENOL) 500 MG tablet Take 1,000 mg by mouth every 6 (six) hours as needed for moderate pain (back pain).    Marland Kitchen albuterol (PROVENTIL HFA;VENTOLIN HFA) 108 (90 BASE) MCG/ACT inhaler Inhale 2 puffs into the lungs every 6 (six) hours as needed for wheezing or shortness of breath (wheezing).    Marland Kitchen albuterol (PROVENTIL) (5 MG/ML) 0.5% nebulizer solution Take 2.5 mg by nebulization every 6 (six) hours as needed for wheezing or shortness of breath (wheezing).     . ALPRAZolam (XANAX) 0.5 MG tablet Take 1 tablet (0.5 mg total) by mouth at bedtime as needed for anxiety (insomnia). 30 tablet 0  . buPROPion (WELLBUTRIN XL) 150 MG 24 hr tablet Take  1 tablet by mouth every morning.  0  . rOPINIRole (REQUIP) 0.25 MG tablet Take 1 tablet (0.25 mg total) by mouth at bedtime. 30 tablet 5  . sertraline (ZOLOFT) 100 MG tablet Take 100 mg by mouth 2 (two) times daily.     No current facility-administered medications for this visit.    Review of Systems: General: Denies fever, chills, diaphoresis, appetite change and fatigue.  Respiratory: Denies SOB, DOE, cough, and wheezing.   Cardiovascular: Denies chest pain and palpitations.  Gastrointestinal: Denies nausea, vomiting, abdominal pain, and diarrhea.  Genitourinary: Denies dysuria, increased frequency, and flank pain. Endocrine: Denies hot or cold intolerance, polyuria, and polydipsia. Musculoskeletal: Denies myalgias, back pain, joint swelling, arthralgias and gait problem.  Skin: Positive for rash. Denies pallor, wounds.  Neurological: Denies dizziness, seizures, syncope, weakness, lightheadedness, numbness and headaches.  Psychiatric/Behavioral: Denies mood changes, and sleep disturbances.  Objective:   Physical Exam: Filed Vitals:   12/17/15 1055  BP: 111/71  Pulse: 76  Temp: 97.9 F (36.6 C)  TempSrc: Oral  Height: 5\' 7"  (1.702 m)  Weight: 276 lb 14.4 oz (125.601 kg)  SpO2: 95%    General: Obese white female, alert, cooperative, NAD. HEENT: PERRL, EOMI. Moist mucus membranes Neck: Full range of motion without pain, supple, no lymphadenopathy or carotid bruits Lungs: Clear to ascultation bilaterally,  normal work of respiration, no wheezes, rales, rhonchi Heart: RRR, no murmurs, gallops, or rubs Abdomen: Soft, non-tender, non-distended, BS +. Erythematous, dry, lacy appearing rash under breasts bilaterally.  Extremities: No cyanosis, clubbing, or edema. Rash on forearm shown below. Neurologic: Alert & oriented X3, cranial nerves II-XII intact, strength grossly intact, sensation intact to light touch  RASH (right forearm):      Assessment & Plan:   Please see  problem based assessment and plan.

## 2015-12-17 NOTE — Patient Instructions (Signed)
1. Please make a follow up appointment for 3 months, unless you feel you need to be seen sooner.   2. Please take all medications as previously prescribed with the following changes:  Take Fluconazole 200 mg every other day for only two doses. This will help treat the rash on your arm and under your breasts.   If the rash under your breasts doesn't completely resolve after a few days, you can use Nystatin powder for this as well.   Please do stretches and use heating pad for right foot. Take Flexeril 5 mg every 8 hours ONLY as needed for muscle tightness. This will make you sleepy, may be better to take at night. DO NOT DRIVE WITH THIS MEDICATION.  3. If you have worsening of your symptoms or new symptoms arise, please call the clinic [phone]), or go to the ER immediately if symptoms are severe.  Body Ringworm Ringworm (tinea corporis) is a fungal infection of the skin on the body. This infection is not caused by worms, but is actually caused by a fungus. Fungus normally lives on the top of your skin and can be useful. However, in the case of ringworms, the fungus grows out of control and causes a skin infection. It can involve any area of skin on the body and can spread easily from one person to another (contagious). Ringworm is a common problem for children, but it can affect adults as well. Ringworm is also often found in athletes, especially wrestlers who share equipment and mats.  CAUSES  Ringworm of the body is caused by a fungus called dermatophyte. It can spread by:  Touchingother people who are infected.  Touchinginfected pets.  Touching or sharingobjects that have been in contact with the infected person or pet (hats, combs, towels, clothing, sports equipment). SYMPTOMS   Itchy, raised red spots and bumps on the skin.  Ring-shaped rash.  Redness near the border of the rash with a clear center.  Dry and scaly skin on or around the rash. Not every person develops a  ring-shaped rash. Some develop only the red, scaly patches. DIAGNOSIS  Most often, ringworm can be diagnosed by performing a skin exam. Your caregiver may choose to take a skin scraping from the affected area. The sample will be examined under the microscope to see if the fungus is present.  TREATMENT  Body ringworm may be treated with a topical antifungal cream or ointment. Sometimes, an antifungal shampoo that can be used on your body is prescribed. You may be prescribed antifungal medicines to take by mouth if your ringworm is severe, keeps coming back, or lasts a long time.  HOME CARE INSTRUCTIONS   Only take over-the-counter or prescription medicines as directed by your caregiver.  Wash the infected area and dry it completely before applying yourcream or ointment.  When using antifungal shampoo to treat the ringworm, leave the shampoo on the body for 3-5 minutes before rinsing.   Wear loose clothing to stop clothes from rubbing and irritating the rash.  Wash or change your bed sheets every night while you have the rash.  Have your pet treated by your veterinarian if it has the same infection. To prevent ringworm:   Practice good hygiene.  Wear sandals or shoes in public places and showers.  Do not share personal items with others.  Avoid touching red patches of skin on other people.  Avoid touching pets that have bald spots or wash your hands after doing so. [place]  IF:   Your rash continues to spread after 7 days of treatment.  Your rash is not gone in 4 weeks.  The area around your rash becomes red, warm, tender, and swollen.   This information is not intended to replace advice given to you by your health care provider. Make sure you discuss any questions you have with your health care provider.   Document Released: 10/21/2000 Document Revised: 07/18/2012 Document Reviewed: 05/07/2012 Elsevier Interactive Patient Education [employer].

## 2015-12-17 NOTE — Assessment & Plan Note (Signed)
Tall toes painful, appear to be muscle spasm, vs contraction. No systemic explanation for true contraction. She states this gets worse throughout the day.  -Will treat conservatively for now. Instructed her to try stretching and foot exercises to relax plantar muscles. -Flexeril 5 mg q8h prn for spasm and pain.

## 2015-12-17 NOTE — Assessment & Plan Note (Signed)
Rash on forearm appears to be fungal in nature, well demarcated, erythematous, slightly scaly, itchy. Looks like tinea corporis. No discharge, tenderness, petechiae, or systemic symptoms.  -Given presence of intertrigo as well, will treat with Diflucan 200 mg every other day for two doses.  -RTC to follow up with PCP in 3 months unless already scheduled sooner.

## 2015-12-17 NOTE — Assessment & Plan Note (Signed)
Patient with intertrigo under breasts bilaterally. Has had this in the past.  -Given Fluconazole 200 mg to take every other day for two doses. This will cover her tinea as well.  -Also given Rx for Nystatin powder to use if intertrigo persists.

## 2015-12-21 NOTE — Progress Notes (Signed)
Internal Medicine Clinic Attending  Case discussed with Dr. Jones at the time of the visit.  We reviewed the resident's history and exam and pertinent patient test results.  I agree with the assessment, diagnosis, and plan of care documented in the resident's note.  

## 2015-12-29 ENCOUNTER — Encounter: Payer: Self-pay | Admitting: Internal Medicine

## 2015-12-29 ENCOUNTER — Ambulatory Visit (INDEPENDENT_AMBULATORY_CARE_PROVIDER_SITE_OTHER): Payer: Commercial Managed Care - HMO | Admitting: Internal Medicine

## 2015-12-29 VITALS — BP 121/72 | HR 79 | Temp 97.7°F | Ht 67.0 in | Wt 275.7 lb

## 2015-12-29 DIAGNOSIS — B354 Tinea corporis: Secondary | ICD-10-CM | POA: Diagnosis not present

## 2015-12-29 DIAGNOSIS — R05 Cough: Secondary | ICD-10-CM

## 2015-12-29 DIAGNOSIS — J449 Chronic obstructive pulmonary disease, unspecified: Secondary | ICD-10-CM | POA: Diagnosis not present

## 2015-12-29 DIAGNOSIS — R059 Cough, unspecified: Secondary | ICD-10-CM

## 2015-12-29 MED ORDER — CLOTRIMAZOLE 1 % EX CREA: 1.0000 "application " | TOPICAL_CREAM | Freq: Two times a day (BID) | CUTANEOUS | Status: DC

## 2015-12-29 MED ORDER — GUAIFENESIN-CODEINE 100-10 MG/5ML PO SOLN: 5.0000 mL | Freq: Three times a day (TID) | ORAL | Status: DC | PRN

## 2015-12-29 NOTE — Patient Instructions (Addendum)
General Instructions:  Try applying the lotramin cream for the next 1-2 weeks if that does not improve it come back and we may try a steroid cream.  Please bring your medicines with you each time you come to clinic.  Medicines may include prescription medications, over-the-counter medications, herbal remedies, eye drops, vitamins, or other pills.   Progress Toward Treatment Goals:  No flowsheet data found.  Self Care Goals & Plans:  No flowsheet data found.  No flowsheet data found.   Care Management & Community Referrals:  Referral 11/30/2012  Referrals made to community resources other (see comments)

## 2015-12-29 NOTE — Assessment & Plan Note (Signed)
A: Mild COPD exacerbation versus acute bronchitis  -She has a history of chronic respiratory failure with nocturnal O2 supplementation.  She has a history of COPD listed on her chart and although PFTs have been ordered in the past they have never been completed by my review.  P: -Offered Flu PCR but patient refused. - Offered CXR patient intially agreed, then refused, then called back wanting antibiotic and agreed to CXR so this has been reordered.  Treat with antibiotics based on result but suspect viral etiology. - Guaifenesin-codeine cough syrup

## 2015-12-29 NOTE — Assessment & Plan Note (Signed)
A: Tinea corporis  P: Trial of clotrimazole cream If not better in 1-2 week consider treatment with steroid cream versus biopsy
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
## 2015-12-31 NOTE — Progress Notes (Signed)
Internal Medicine Clinic Attending  I saw and evaluated the patient.  I personally confirmed the key portions of the history and exam documented by Dr. Hoffman and I reviewed pertinent patient test results.  The assessment, diagnosis, and plan were formulated together and I agree with the documentation in the resident's note.      

## 2016-01-21 ENCOUNTER — Ambulatory Visit (INDEPENDENT_AMBULATORY_CARE_PROVIDER_SITE_OTHER): Payer: Commercial Managed Care - HMO | Admitting: Internal Medicine

## 2016-01-21 ENCOUNTER — Encounter: Payer: Self-pay | Admitting: Internal Medicine

## 2016-01-21 VITALS — BP 130/81 | HR 75 | Temp 97.8°F | Wt 281.8 lb

## 2016-01-21 DIAGNOSIS — J4541 Moderate persistent asthma with (acute) exacerbation: Secondary | ICD-10-CM

## 2016-01-21 DIAGNOSIS — G4733 Obstructive sleep apnea (adult) (pediatric): Secondary | ICD-10-CM | POA: Diagnosis not present

## 2016-01-21 DIAGNOSIS — Z6841 Body Mass Index (BMI) 40.0 and over, adult: Secondary | ICD-10-CM

## 2016-01-21 DIAGNOSIS — F32A Depression, unspecified: Secondary | ICD-10-CM

## 2016-01-21 DIAGNOSIS — J45909 Unspecified asthma, uncomplicated: Secondary | ICD-10-CM | POA: Diagnosis not present

## 2016-01-21 DIAGNOSIS — F329 Major depressive disorder, single episode, unspecified: Secondary | ICD-10-CM

## 2016-01-21 DIAGNOSIS — Z79899 Other long term (current) drug therapy: Secondary | ICD-10-CM

## 2016-01-21 DIAGNOSIS — L988 Other specified disorders of the skin and subcutaneous tissue: Secondary | ICD-10-CM | POA: Diagnosis not present

## 2016-01-21 DIAGNOSIS — L300 Nummular dermatitis: Secondary | ICD-10-CM

## 2016-01-21 DIAGNOSIS — Z0000 Encounter for general adult medical examination without abnormal findings: Secondary | ICD-10-CM

## 2016-01-21 MED ORDER — BETAMETHASONE DIPROPIONATE 0.05 % EX CREA: TOPICAL_CREAM | Freq: Two times a day (BID) | CUTANEOUS | Status: DC

## 2016-01-21 NOTE — Assessment & Plan Note (Signed)
Weight went up a bit bc stress level decreased in house.   Wt Readings from Last 3 Encounters:  01/21/16 281 lb 12.8 oz (127.824 kg)  12/29/15 275 lb 11.2 oz (125.057 kg)  12/17/15 276 lb 14.4 oz (125.601 kg)   PLAN : encourage weight loss. Monitor.

## 2016-01-21 NOTE — Assessment & Plan Note (Signed)
No longer seeing Dr Melvyn Novas. Only on alb PRN and uses 4 times per month. Doesn't seem to get too DOE bc her dog (Laya a pug) walks slow bc she is overweight and gets DOE.   PLAN:  Cont current meds

## 2016-01-21 NOTE — Assessment & Plan Note (Signed)
Denies any sxs - HA, fatigue, snore - and doesn't want to pursue it further. Only uses O2 when she has an acute flare like bronchitis. There was no indication of RV abnl on ECHO in 2013 from long standing hypoxemia.   PLAN : follow

## 2016-01-21 NOTE — Patient Instructions (Addendum)
1. See me in 6 months 2. Apply the cream once a day, may take 2-4 weeks to improve 3. I am referring you to dermatology

## 2016-01-21 NOTE — Assessment & Plan Note (Signed)
She had no response to the topical antifungal. 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 OK with empiric topical steroids and referral to derm. Understands that if this is fungal (although both of [place] doubt this) steroids would make rash worse.  PLAN : high potency topical steroid to lesion Derm referral
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
## 2016-01-21 NOTE — Assessment & Plan Note (Signed)
Had DEXA several yrs ago at outlying facility and told her spine was bad but no meds Rx'd. Her mother and grandmother had kyphosis. The pt is noticing that she is stooping a bit. No sig RF - not underweight, not a smoker, no steroids, no RA...  PLAN : DEXA

## 2016-01-21 NOTE — Progress Notes (Signed)
   Subjective:    Patient ID: [name] [name], female    DOB: Jul 29, 1956, 60 y.o.   MRN: [id number]  HPI  [name] [name] is here for skin lesion. Please see the A&P for the status of the pt's chronic medical problems.  ROS : per ROS section and in problem oriented charting. All other systems are negative. PMHx, Soc hx, and / or Fam hx : Lives with husband. Son lives with them but grandson moved out so stress level lower.   Review of Systems  Constitutional: Positive for unexpected weight change. Negative for fatigue.  Respiratory:       No SOB Mild DOE  Skin: Positive for color change and rash.  Neurological: Negative for headaches.       No snoring  Psychiatric/Behavioral:       Anxiety and panic attacks. Sleep OK       Objective:   Physical Exam  Constitutional: She is oriented to person, place, and time. She appears well-developed and well-nourished. No distress.  HENT:  Head: Normocephalic and atraumatic.  Right Ear: External ear normal.  Left Ear: External ear normal.  Nose: Nose normal.  Mouth/Throat: Oropharynx is clear and moist. No oropharyngeal exudate.  Mild erythema. No oral lesions  Eyes: Conjunctivae and EOM are normal.  Cardiovascular: Normal rate, regular rhythm and normal heart sounds.   Pulmonary/Chest: Effort normal and breath sounds normal. No respiratory distress.  Musculoskeletal: Normal range of motion. She exhibits no edema or tenderness.  Neurological: She is alert and oriented to person, place, and time.  Skin: Skin is warm and dry. She is not diaphoretic.  Circular plaques that are erythematous, slight scaling, discrete border. No central clearing. All < 1 cm.  Psychiatric: She has a normal mood and affect. Her behavior is normal. Judgment and thought content normal.          Assessment & Plan:

## 2016-02-16 DIAGNOSIS — L304 Erythema intertrigo: Secondary | ICD-10-CM | POA: Diagnosis not present

## 2016-02-16 DIAGNOSIS — D225 Melanocytic nevi of trunk: Secondary | ICD-10-CM | POA: Diagnosis not present

## 2016-02-16 DIAGNOSIS — L308 Other specified dermatitis: Secondary | ICD-10-CM | POA: Diagnosis not present

## 2016-02-16 DIAGNOSIS — B078 Other viral warts: Secondary | ICD-10-CM | POA: Diagnosis not present

## 2016-02-16 DIAGNOSIS — L280 Lichen simplex chronicus: Secondary | ICD-10-CM | POA: Diagnosis not present

## 2016-02-16 DIAGNOSIS — L258 Unspecified contact dermatitis due to other agents: Secondary | ICD-10-CM | POA: Diagnosis not present

## 2016-02-16 DIAGNOSIS — B079 Viral wart, unspecified: Secondary | ICD-10-CM | POA: Diagnosis not present

## 2016-02-25 ENCOUNTER — Encounter: Payer: Self-pay | Admitting: Internal Medicine

## 2016-02-25 ENCOUNTER — Ambulatory Visit (INDEPENDENT_AMBULATORY_CARE_PROVIDER_SITE_OTHER): Payer: Commercial Managed Care - HMO | Admitting: Internal Medicine

## 2016-02-25 DIAGNOSIS — J4541 Moderate persistent asthma with (acute) exacerbation: Secondary | ICD-10-CM

## 2016-02-25 DIAGNOSIS — J45909 Unspecified asthma, uncomplicated: Secondary | ICD-10-CM | POA: Diagnosis not present

## 2016-02-25 MED ORDER — PREDNISONE 20 MG PO TABS: ORAL_TABLET | ORAL | Status: DC

## 2016-02-25 MED ORDER — AZITHROMYCIN 250 MG PO TABS: ORAL_TABLET | ORAL | Status: DC

## 2016-02-25 NOTE — Addendum Note (Signed)
Addended by: Jones Bales on: 02/25/2016 11:47 AM   Modules accepted: Orders

## 2016-02-25 NOTE — Progress Notes (Signed)
Medicine attending: Medical history, presenting problems, physical findings, and medications, reviewed with resident physician Dr Michail Jewels on the day of the patient visit and I concur with her evaluation and management plan.
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
## 2016-02-25 NOTE — Assessment & Plan Note (Addendum)
Pt presents with increased cough with sputum production, "hot flashes", chills since Sunday.  Also with chest tightness and tenderness to palpation.  Has been using her nebulizer treatment which helps.  Still uses albuterol 4x month PRN.  No dyspnea and O2sat 94% RA.  Mild diffuse expiratory wheezes on exam.  Has seen pulmonology in the past and likely asthma and not COPD.  Likely asthma exacerbation triggered by viral URI.  -prednisone taper x 5 days -will provide 5 day course of zpak  -advised to return in 1 week if symptoms not improved

## 2016-02-25 NOTE — Patient Instructions (Addendum)
Thank you for your visit today.   Please return to the internal medicine clinic to follow up with Dr. Lynnae January at your regularly scheduled appointment.     You likely have a viral illness that has caused your symptoms.  I have given you prednisone for 5 days, please follow the instructions on the bottle. Please continue using your nebulizer. Please call the clinic if your symptoms do not improve in 1 week.  Please be sure to bring all of your medications with you to every visit; this includes herbal supplements, vitamins, eye drops, and any over-the-counter medications.   Should you have any questions regarding your medications and/or any new or worsening symptoms, please be sure to call the clinic at [phone].   If you believe that you are suffering from a life threatening condition or one that may result in the loss of limb or function, then you should call 911 and proceed to the nearest Emergency Department.    A healthy lifestyle and preventative care can promote health and wellness.   Maintain regular health, dental, and eye exams.  Eat a healthy diet. Foods like vegetables, fruits, whole grains, low-fat dairy products, and lean protein foods contain the nutrients you need without too many calories. Decrease your intake of foods high in solid fats, added sugars, and salt. Get information about a proper diet from your caregiver, if necessary.  Regular physical exercise is one of the most important things you can do for your health. Most adults should get at least 150 minutes of moderate-intensity exercise (any activity that increases your heart rate and causes you to sweat) each week. In addition, most adults need muscle-strengthening exercises on 2 or more days a week.   Maintain a healthy weight. The body mass index (BMI) is a screening tool to identify possible weight problems. It provides an estimate of body fat based on height and weight. Your caregiver can help determine your  BMI, and can help you achieve or maintain a healthy weight. For adults 20 years and older:  A BMI below 18.5 is considered underweight.  A BMI of 18.5 to 24.9 is normal.  A BMI of 25 to 29.9 is considered overweight.  A BMI of 30 and above is considered obese.

## 2016-04-12 DIAGNOSIS — J209 Acute bronchitis, unspecified: Secondary | ICD-10-CM | POA: Diagnosis not present

## 2016-04-12 DIAGNOSIS — R070 Pain in throat: Secondary | ICD-10-CM | POA: Diagnosis not present

## 2016-05-30 DIAGNOSIS — F39 Unspecified mood [affective] disorder: Secondary | ICD-10-CM | POA: Diagnosis not present

## 2016-06-05 DIAGNOSIS — J449 Chronic obstructive pulmonary disease, unspecified: Secondary | ICD-10-CM | POA: Diagnosis not present

## 2016-06-10 ENCOUNTER — Ambulatory Visit (INDEPENDENT_AMBULATORY_CARE_PROVIDER_SITE_OTHER): Payer: Commercial Managed Care - HMO | Admitting: Internal Medicine

## 2016-06-10 DIAGNOSIS — Z87891 Personal history of nicotine dependence: Secondary | ICD-10-CM | POA: Diagnosis not present

## 2016-06-10 DIAGNOSIS — J209 Acute bronchitis, unspecified: Secondary | ICD-10-CM

## 2016-06-10 MED ORDER — AZITHROMYCIN 500 MG PO TABS: 500.0000 mg | ORAL_TABLET | Freq: Every day | ORAL | 0 refills | Status: AC

## 2016-06-10 NOTE — Assessment & Plan Note (Addendum)
Patient is here for 10 day history of productive cough with sputum production, some chills, feeling tired. She denies any myalgias. Has some sneezing. SHe has ? COPD (2/14 FEV1 57%) / asthma. She has continued to use PRN dulera, and uses PRN albuterol 4 times a month, but currently uses twice a day for 5 days. She feels that she may need antibiotics.  She also has increased dyspnea from baseline. She has sleep apnea but does not use CPAP and ?OHS.She tried using mucinex and tylenol without help. She quit smoking 1.5 years ago but has > 30 pack year smoking history as she used to smoke 2 pack per day for > 20 years  On exam, HEENT clear oropharynx. No erythema or exudates. Lungs mild end exp wheezing.  -Prescribed azithromycin 500 mg for 3 days -Follow up in 7-10 days if symptoms do not improve -advised that she should not use dulera PRN as advised previously and to use albuterol PRN

## 2016-06-10 NOTE — Progress Notes (Signed)
Case discussed with Dr. Tiburcio Pea at the time of the visit.  We reviewed the resident's history and exam and pertinent patient test results.  I agree with the assessment, diagnosis and plan of care documented in the resident's note.  It sounds like the patient requires more education on how to appropriately take her inhalers.  If she has a diagnosis of asthma and her symptoms are more than mild intermittent, she may benefit from an inhaled steroid taken on a regular, not PRN, basis.

## 2016-06-10 NOTE — Progress Notes (Signed)
    CC: bronchitis HPI: [name] is a 60 y.o. woman with ?asthma/presumed COPD, obesity and other PMH noted below who is here for 10 day duration of bronchitis.  Please see Problem List/A&P for the status of the patient's chronic medical problems   Past Medical History:  Diagnosis Date  . Anxiety   . Asthma    Chronic, with restrictive component 2/2 obesity  . Depression   . GERD (gastroesophageal reflux disease)   . Headache   . Insomnia   . Obesity   . On home oxygen therapy    "3L at night only when I get sick" (04/30/2014)  . Shortness of breath dyspnea   . Sleep apnea    "couldn't afford mask so I didn't get it" (04/30/2014)    Review of Systems:  Gen: Denies fevers, chills, fatigue or myalgias HEENT: ha some headaches and sneezing.  Resp: has cough with green sputum production for past 10 days, has some dyspnea, using her albuterol BID for past 7 days. Otherwise using 4 times a month  Gastrointestinal: Negative for nausea, vomiting, abdominal pain, diarrhea   Physical Exam: Vitals:   06/10/16 1348  BP: 127/65  Pulse: 82  Temp: 97.7 F (36.5 C)  TempSrc: Oral  SpO2: 95%  Weight: 293 lb 4.8 oz (133 kg)  Height: 5\' 8"  (1.727 m)    General: A&O, in NAD, obese, uses nighttime oxygen 2 L  HEENT: MMM, no pharyngeal exudates  Neck: no cervical lymphadenopathy  CV: RRR, normal s1, s2, no m/r/g,  Resp: mild end exp wheezing b/l Abdomen: soft, nontender, nondistended, +BS   Assessment & Plan:   See encounters tab for problem based medical decision making. Patient discussed with Dr. Eppie Gibson

## 2016-06-10 NOTE — Patient Instructions (Signed)
Thank you for your visit today  Please take the azithromycin 500 mg for 3 days.   Please use your albuterol as needed  Please follow up in 7 to 10 days if your symptoms do not improve   Acute Bronchitis Bronchitis is inflammation of the airways that extend from the windpipe into the lungs (bronchi). The inflammation often causes mucus to develop. This leads to a cough, which is the most common symptom of bronchitis.  In acute bronchitis, the condition usually develops suddenly and goes away over time, usually in a couple weeks. Smoking, allergies, and asthma can make bronchitis worse. Repeated episodes of bronchitis may cause further lung problems.  CAUSES Acute bronchitis is most often caused by the same virus that causes a cold. The virus can spread from person to person (contagious) through coughing, sneezing, and touching contaminated objects. SIGNS AND SYMPTOMS   Cough.   Fever.   Coughing up mucus.   Body aches.   Chest congestion.   Chills.   Shortness of breath.   Sore throat.  DIAGNOSIS  Acute bronchitis is usually diagnosed through a physical exam. Your health care provider will also ask you questions about your medical history. Tests, such as chest X-rays, are sometimes done to rule out other conditions.  TREATMENT  Acute bronchitis usually goes away in a couple weeks. Oftentimes, no medical treatment is necessary. Medicines are sometimes given for relief of fever or cough. Antibiotic medicines are usually not needed but may be prescribed in certain situations. In some cases, an inhaler may be recommended to help reduce shortness of breath and control the cough. A cool mist vaporizer may also be used to help thin bronchial secretions and make it easier to clear the chest.  HOME CARE INSTRUCTIONS  Get plenty of rest.   Drink enough fluids to keep your urine clear or pale yellow (unless you have a medical condition that requires fluid restriction). Increasing  fluids may help thin your respiratory secretions (sputum) and reduce chest congestion, and it will prevent dehydration.   Take medicines only as directed by your health care provider.  If you were prescribed an antibiotic medicine, finish it all even if you start to feel better.  Avoid smoking and secondhand smoke. Exposure to cigarette smoke or irritating chemicals will make bronchitis worse. If you are a smoker, consider using nicotine gum or skin patches to help control withdrawal symptoms. Quitting smoking will help your lungs heal faster.   Reduce the chances of another bout of acute bronchitis by washing your hands frequently, avoiding people with cold symptoms, and trying not to touch your hands to your mouth, nose, or eyes.   Keep all follow-up visits as directed by your health care provider.  SEEK MEDICAL CARE IF: Your symptoms do not improve after 1 week of treatment.  SEEK IMMEDIATE MEDICAL CARE IF:  You develop an increased fever or chills.   You have chest pain.   You have severe shortness of breath.  You have bloody sputum.   You develop dehydration.  You faint or repeatedly feel like you are going to pass out.  You develop repeated vomiting.  You develop a severe headache. MAKE SURE YOU:   Understand these instructions.  Will watch your condition.  Will get help right away if you are not doing well or get worse.   This information is not intended to replace advice given to you by your health care provider. Make sure you discuss any questions you have with  your health care provider.   Document Released: 12/01/2004 Document Revised: 11/14/2014 Document Reviewed: 04/16/2013 Elsevier Interactive Patient Education [employer].

## 2016-06-24 ENCOUNTER — Ambulatory Visit (INDEPENDENT_AMBULATORY_CARE_PROVIDER_SITE_OTHER): Payer: Commercial Managed Care - HMO | Admitting: Internal Medicine

## 2016-06-24 ENCOUNTER — Encounter: Payer: Self-pay | Admitting: Internal Medicine

## 2016-06-24 VITALS — BP 147/86 | HR 81 | Temp 97.9°F | Ht 68.0 in | Wt 294.8 lb

## 2016-06-24 DIAGNOSIS — K219 Gastro-esophageal reflux disease without esophagitis: Secondary | ICD-10-CM

## 2016-06-24 DIAGNOSIS — J4540 Moderate persistent asthma, uncomplicated: Secondary | ICD-10-CM

## 2016-06-24 DIAGNOSIS — J441 Chronic obstructive pulmonary disease with (acute) exacerbation: Secondary | ICD-10-CM | POA: Diagnosis not present

## 2016-06-24 MED ORDER — MOMETASONE FURO-FORMOTEROL FUM 100-5 MCG/ACT IN AERO: 2.0000 | INHALATION_SPRAY | Freq: Two times a day (BID) | RESPIRATORY_TRACT | Status: DC

## 2016-06-24 MED ORDER — PREDNISONE 20 MG PO TABS: 40.0000 mg | ORAL_TABLET | Freq: Every day | ORAL | 0 refills | Status: AC

## 2016-06-24 MED ORDER — BENZONATATE 100 MG PO CAPS: 100.0000 mg | ORAL_CAPSULE | Freq: Three times a day (TID) | ORAL | 1 refills | Status: DC | PRN

## 2016-06-24 MED ORDER — SULFAMETHOXAZOLE-TRIMETHOPRIM 400-80 MG PO TABS: 1.0000 | ORAL_TABLET | Freq: Every day | ORAL | 0 refills | Status: AC

## 2016-06-24 NOTE — Patient Instructions (Addendum)
It was a pleasure meeting you today!   1. Today we talked about your COPD Exacerbation. For this, I've prescribed Bactrim for 10 days. I've also prescribed a steroid called Prednisone. Take 2 of these for a total of 5 days. You should start feeling better within the next few days. Please make an appointment to see me if you are not feeling better within the next 2 weeks. 2. We also discussed using your Dulera inhaler daily and your Albuterol inhaler as needed. I've sent in a refill of your [facility]. 3. Please follow up in 1 month to make sure you are feeling well and so we can do Pulmonary Function Testing (PFTs).   Chronic Obstructive Pulmonary Disease Chronic obstructive pulmonary disease (COPD) is a common lung condition in which airflow from the lungs is limited. COPD is a general term that can be used to describe many different lung problems that limit airflow, including both chronic bronchitis and emphysema. If you have COPD, your lung function will probably never return to normal, but there are measures you can take to improve lung function and make yourself feel better. CAUSES   Smoking (common).  Exposure to secondhand smoke.  Genetic problems.  Chronic inflammatory lung diseases or recurrent infections. SYMPTOMS  Shortness of breath, especially with physical activity.  Deep, persistent (chronic) cough with a large amount of thick mucus.  Wheezing.  Rapid breaths (tachypnea).  Gray or bluish discoloration (cyanosis) of the skin, especially in your fingers, toes, or lips.  Fatigue.  Weight loss.  Frequent infections or episodes when breathing symptoms become much worse (exacerbations).  Chest tightness. DIAGNOSIS Your health care provider will take a medical history and perform a physical examination to diagnose COPD. Additional tests for COPD may include:  Lung (pulmonary) function tests.  Chest X-ray.  CT scan.  Blood tests. TREATMENT  Treatment for COPD may  include:  Inhaler and nebulizer medicines. These help manage the symptoms of COPD and make your breathing more comfortable.  Supplemental oxygen. Supplemental oxygen is only helpful if you have a low oxygen level in your blood.  Exercise and physical activity. These are beneficial for nearly all people with COPD.  Lung surgery or transplant.  Nutrition therapy to gain weight, if you are underweight.  Pulmonary rehabilitation. This may involve working with a team of health care providers and specialists, such as respiratory, occupational, and physical therapists. HOME CARE INSTRUCTIONS  Take all medicines (inhaled or pills) as directed by your health care provider.  Avoid over-the-counter medicines or cough syrups that dry up your airway (such as antihistamines) and slow down the elimination of secretions unless instructed otherwise by your health care provider.  If you are a smoker, the most important thing that you can do is stop smoking. Continuing to smoke will cause further lung damage and breathing trouble. Ask your health care provider for help with quitting smoking. He or she can direct you to community resources or hospitals that provide support.  Avoid exposure to irritants such as smoke, chemicals, and fumes that aggravate your breathing.  Use oxygen therapy and pulmonary rehabilitation if directed by your health care provider. If you require home oxygen therapy, ask your health care provider whether you should purchase a pulse oximeter to measure your oxygen level at home.  Avoid contact with individuals who have a contagious illness.  Avoid extreme temperature and humidity changes.  Eat healthy foods. Eating smaller, more frequent meals and resting before meals may help you maintain your strength.  Stay active, but balance activity with periods of rest. Exercise and physical activity will help you maintain your ability to do things you want to do.  Preventing infection  and hospitalization is very important when you have COPD. Make sure to receive all the vaccines your health care provider recommends, especially the pneumococcal and influenza vaccines. Ask your health care provider whether you need a pneumonia vaccine.  Learn and use relaxation techniques to manage stress.  Learn and use controlled breathing techniques as directed by your health care provider. Controlled breathing techniques include:  Pursed lip breathing. Start by breathing in (inhaling) through your nose for 1 second. Then, purse your lips as if you were going to whistle and breathe out (exhale) through the pursed lips for 2 seconds.  Diaphragmatic breathing. Start by putting one hand on your abdomen just above your waist. Inhale slowly through your nose. The hand on your abdomen should move out. Then purse your lips and exhale slowly. You should be able to feel the hand on your abdomen moving in as you exhale.  Learn and use controlled coughing to clear mucus from your lungs. Controlled coughing is a series of short, progressive coughs. The steps of controlled coughing are: 1. Lean your head slightly forward. 2. Breathe in deeply using diaphragmatic breathing. 3. Try to hold your breath for 3 seconds. 4. Keep your mouth slightly open while coughing twice. 5. Spit any mucus out into a tissue. 6. Rest and repeat the steps once or twice as needed. SEEK MEDICAL CARE IF:  You are coughing up more mucus than usual.  There is a change in the color or thickness of your mucus.  Your breathing is more labored than usual.  Your breathing is faster than usual. SEEK IMMEDIATE MEDICAL CARE IF:  You have shortness of breath while you are resting.  You have shortness of breath that prevents you from:  Being able to talk.  Performing your usual physical activities.  You have chest pain lasting longer than 5 minutes.  Your skin color is more cyanotic than usual.  You measure low oxygen  saturations for longer than 5 minutes with a pulse oximeter. MAKE SURE YOU:  Understand these instructions.  Will watch your condition.  Will get help right away if you are not doing well or get worse.   This information is not intended to replace advice given to you by your health care provider. Make sure you discuss any questions you have with your health care provider.   Document Released: 08/03/2005 Document Revised: 11/14/2014 Document Reviewed: 06/20/2013 Elsevier Interactive Patient Education [employer].

## 2016-06-24 NOTE — Progress Notes (Signed)
   CC: productive cough, increased shortness of breath.  HPI:  [name] is a pleasant 60 y.o. female who presents to the clinic today for evaluation of a 4 week history of increased shortness of breath and productive cough. She reports she's coughing more than usual and that its productive of yellow-green sputum, worse in the mornings. The patient also reports subjective fevers and chills and has been unable to walk as far without becoming short of breath. Per patient she has been using her Albuterol and Dulera inhalers only as needed.  She was recently seen in the clinic 8/4 for the same symptoms and was given a 3 day course of Azithromycin without any improvement.   Past Medical History:  Diagnosis Date  . Anxiety   . Asthma    Chronic, with restrictive component 2/2 obesity  . Depression   . GERD (gastroesophageal reflux disease)   . Headache   . Insomnia   . Obesity   . On home oxygen therapy    "3L at night only when I get sick" (04/30/2014)  . Shortness of breath dyspnea   . Sleep apnea    "couldn't afford mask so I didn't get it" (04/30/2014)    Review of Systems:  Review of Systems  Constitutional: Positive for chills, fever and malaise/fatigue.  HENT: Negative for congestion and sore throat.   Eyes: Negative for discharge and redness.  Respiratory: Positive for cough, sputum production, shortness of breath and wheezing.   Cardiovascular: Positive for orthopnea. Negative for chest pain and leg swelling.  Gastrointestinal: Positive for heartburn. Negative for abdominal pain, constipation, diarrhea, nausea and vomiting.  Genitourinary: Negative for dysuria.  Musculoskeletal: Negative for myalgias.  Neurological: Negative for headaches.  Endo/Heme/Allergies: Negative for environmental allergies.     Physical Exam: Physical Exam  Constitutional: She is well-developed, well-nourished, and in no distress. No distress.  HENT:  Head: Normocephalic and atraumatic.    Mouth/Throat: Oropharynx is clear and moist. No oropharyngeal exudate.  Cardiovascular: Normal rate and regular rhythm.   Pulmonary/Chest: Effort normal. She has wheezes. She has no rales.  Diffuse expiratory wheezes. Good air flow.   Abdominal: Soft. Bowel sounds are normal. She exhibits no distension.  Lymphadenopathy:    She has no cervical adenopathy.  Skin: Skin is warm and dry. She is not diaphoretic.    Vitals:   06/24/16 1404  BP: (!) 147/86  Pulse: 81  Temp: 97.9 F (36.6 C)  TempSrc: Oral  SpO2: 100%  Weight: 294 lb 12.8 oz (133.7 kg)  Height: 5\' 8"  (1.727 m)    Assessment & Plan:   See Encounters Tab for problem based charting.  Patient seen with Dr. Lynnae January

## 2016-06-24 NOTE — Assessment & Plan Note (Signed)
Patient reports taking OTC H2 blocker with improvement of her symptoms.

## 2016-06-24 NOTE — Addendum Note (Signed)
Addended by: Tamsen Roers on: 06/24/2016 04:16 PM   Modules accepted: Orders

## 2016-06-24 NOTE — Assessment & Plan Note (Addendum)
Patient presents with a 1 month history of increased dyspnea, cough and sputum production. She was given a 3 day course of Azithromycin without any improvement. The patient also intermittently uses her Dulera and Albuterol inhalers but has been using her Albuterol more frequently lately.  Patient given 10 days of Bactrim as well as 5 days of Prednisone 40mg .  Good discussion was had with the patient regarding proper inhaler use. She was instructed to take [facility] daily instead of as a rescue inhaler and to use her Albuterol instead as the rescue inhaler.  Patient instructed to follow up in 2 weeks if her symptoms worsen or do not improve.  Patient to return in 1 month for PFT's.

## 2016-06-27 NOTE — Progress Notes (Signed)
Internal Medicine Clinic Attending  I saw and evaluated the patient.  I personally confirmed the key portions of the history and exam documented by Dr. Molt and I reviewed pertinent patient test results.  The assessment, diagnosis, and plan were formulated together and I agree with the documentation in the resident's note. 

## 2016-07-06 ENCOUNTER — Ambulatory Visit (HOSPITAL_COMMUNITY)
Admission: RE | Admit: 2016-07-06 | Discharge: 2016-07-06 | Disposition: A | Discharge status: Home / Self Care | Payer: Commercial Managed Care - HMO | Source: Ambulatory Visit | Attending: Internal Medicine | Admitting: Internal Medicine

## 2016-07-06 ENCOUNTER — Ambulatory Visit (INDEPENDENT_AMBULATORY_CARE_PROVIDER_SITE_OTHER): Payer: Commercial Managed Care - HMO | Admitting: Internal Medicine

## 2016-07-06 VITALS — BP 127/71 | HR 81 | Temp 98.0°F | Ht 67.0 in | Wt 295.6 lb

## 2016-07-06 DIAGNOSIS — R0602 Shortness of breath: Secondary | ICD-10-CM | POA: Diagnosis not present

## 2016-07-06 DIAGNOSIS — J441 Chronic obstructive pulmonary disease with (acute) exacerbation: Secondary | ICD-10-CM

## 2016-07-06 DIAGNOSIS — R918 Other nonspecific abnormal finding of lung field: Secondary | ICD-10-CM | POA: Diagnosis not present

## 2016-07-06 DIAGNOSIS — J449 Chronic obstructive pulmonary disease, unspecified: Secondary | ICD-10-CM

## 2016-07-06 DIAGNOSIS — Z9981 Dependence on supplemental oxygen: Secondary | ICD-10-CM | POA: Diagnosis not present

## 2016-07-06 DIAGNOSIS — Z9119 Patient's noncompliance with other medical treatment and regimen: Secondary | ICD-10-CM

## 2016-07-06 DIAGNOSIS — J9611 Chronic respiratory failure with hypoxia: Secondary | ICD-10-CM

## 2016-07-06 DIAGNOSIS — R5383 Other fatigue: Secondary | ICD-10-CM | POA: Diagnosis not present

## 2016-07-06 DIAGNOSIS — R05 Cough: Secondary | ICD-10-CM | POA: Diagnosis not present

## 2016-07-06 DIAGNOSIS — R109 Unspecified abdominal pain: Secondary | ICD-10-CM

## 2016-07-06 DIAGNOSIS — G4733 Obstructive sleep apnea (adult) (pediatric): Secondary | ICD-10-CM | POA: Diagnosis not present

## 2016-07-06 LAB — BRAIN NATRIURETIC PEPTIDE: B NATRIURETIC PEPTIDE 5: 28.5 pg/mL (ref 0.0–100.0)

## 2016-07-06 NOTE — Patient Instructions (Addendum)
It was a pleasure taking care of you again today! I am sorry you are still not feeling well!  1. Today we talked about your persistent shortness of breath. For evaluation of this I have ordered several labs and imaging studies. I've ordered a chest x-ray, an Echocardiogram and several laboratory studies. Please complete the x-ray and echocardiogram. I will call you with the results of your lab studies. 2. Please wear your oxygen at home both at night and when you are moving around. The test we did today walking around the halls shows that you may need to wear your oxygen more often. 3. Please take all of your medications as directed.  4. Please follow up here in the clinic in 2 weeks or sooner if needed. As always, if your symptoms are severe, please go immediately to the emergency room for evaluation and treatment.

## 2016-07-06 NOTE — Progress Notes (Signed)
   CC: shortness of breath, cough, fatigue.   HPI:  [name] [name] is a 60 y.o. with chief complaint of productive cough, shortness of breath and fatigue. The patient has been seen here in the clinic twice over the past month with the same complaints. She was seen initially 8/4 and diagnosed with a COPD exacerbation and sent hom on Azithromycin. She was seen again on 8/18 without symptom improvement and sent home with a 10 day course of Bactrim and 5 day course of steroids. She reports worsening shortness of breath and fatigue since that time. She also complains of headache, subjective fevers and chills. She states her sputum production has improved since last visit and is producing less yellow sputum and more of her baseline clear sputum.   Per patient she is not using her oxygen when ambulating around her house and instead only wears it at home. Patient also has a history of obstructive sleep apnea however refuses to wear the mask as it's uncomfortable.     Past Medical History:  Diagnosis Date  . Anxiety   . Asthma    Chronic, with restrictive component 2/2 obesity  . Depression   . GERD (gastroesophageal reflux disease)   . Headache   . Insomnia   . Obesity   . On home oxygen therapy    "3L at night only when I get sick" (04/30/2014)  . Shortness of breath dyspnea   . Sleep apnea    "couldn't afford mask so I didn't get it" (04/30/2014)    Review of Systems:  Review of Systems  Constitutional: Positive for chills, fever and malaise/fatigue.  HENT: Negative for congestion and sore throat.   Respiratory: Positive for cough, sputum production and shortness of breath.   Cardiovascular: Negative for chest pain and leg swelling.  Gastrointestinal: Negative for abdominal pain, diarrhea, heartburn, nausea and vomiting.  Genitourinary: Negative for dysuria and hematuria.  Neurological: Positive for dizziness and headaches.     Physical Exam: Physical Exam  Constitutional: She is  well-developed, well-nourished, and in no distress.  Appears fatigued.   HENT:  Head: Normocephalic and atraumatic.  Cardiovascular: Normal rate and regular rhythm.   Pulmonary/Chest: Effort normal and breath sounds normal. No respiratory distress. She has no wheezes. She has no rales.  Abdominal: Soft. Bowel sounds are normal. She exhibits no distension. There is no tenderness.  Musculoskeletal:  Some mild edema of BL LE.   Lymphadenopathy:    She has no cervical adenopathy.  Skin: Skin is warm and dry. She is not diaphoretic.    Vitals:   07/06/16 1528  BP: 127/71  Pulse: 81  Temp: 98 F (36.7 C)  TempSrc: Oral  SpO2: (!) 88%  Weight: 295 lb 9.6 oz (134.1 kg)  Height: 5\' 7"  (1.702 m)    Assessment & Plan:   See Encounters Tab for problem based charting.  Patient seen with Dr. Lynnae January

## 2016-07-07 LAB — BASIC METABOLIC PANEL
BUN/Creatinine Ratio: 10 — ABNORMAL LOW (ref 12–28)
BUN: 6 mg/dL — AB (ref 8–27)
CALCIUM: 9.3 mg/dL (ref 8.7–10.3)
CHLORIDE: 101 mmol/L (ref 96–106)
CO2: 23 mmol/L (ref 18–29)
Creatinine, Ser: 0.63 mg/dL (ref 0.57–1.00)
GFR calc non Af Amer: 98 mL/min/{1.73_m2} (ref >59)
GFR, EST AFRICAN AMERICAN: 113 mL/min/{1.73_m2} (ref >59)
Glucose: 100 mg/dL — ABNORMAL HIGH (ref 65–99)
POTASSIUM: 4.2 mmol/L (ref 3.5–5.2)
Sodium: 141 mmol/L (ref 134–144)

## 2016-07-07 LAB — CBC WITH DIFFERENTIAL/PLATELET
Basophils Absolute: 0 10*3/uL (ref 0.0–0.2)
Basos: 0 %
EOS (ABSOLUTE): 0.1 10*3/uL (ref 0.0–0.4)
EOS: 1 %
HEMOGLOBIN: 15.3 g/dL (ref 11.1–15.9)
Hematocrit: 44.6 % (ref 34.0–46.6)
IMMATURE GRANULOCYTES: 0 %
Immature Grans (Abs): 0 10*3/uL (ref 0.0–0.1)
LYMPHS: 21 %
Lymphocytes Absolute: 2 10*3/uL (ref 0.7–3.1)
MCH: 29.1 pg (ref 26.6–33.0)
MCHC: 34.3 g/dL (ref 31.5–35.7)
MCV: 85 fL (ref 79–97)
Monocytes Absolute: 0.6 10*3/uL (ref 0.1–0.9)
Monocytes: 6 %
NEUTROS PCT: 72 %
Neutrophils Absolute: 6.7 10*3/uL (ref 1.4–7.0)
PLATELETS: 179 10*3/uL (ref 150–379)
RBC: 5.25 x10E6/uL (ref 3.77–5.28)
RDW: 14.6 % (ref 12.3–15.4)
WBC: 9.3 10*3/uL (ref 3.4–10.8)

## 2016-07-07 NOTE — Assessment & Plan Note (Signed)
Patient reports worsening shortness of breath and fatigue despite 2 courses of antibiotics (Azithromycin and Bactrim) as well as a course of steroids (5 days of Prednisone). She denies any symptom improvement except for her sputum is no longer as yellow-green.  Patient reports that she has not been wearing her home oxygen. -Evaluated the patient for home oxygen. She was 88% upon presentation to the clinic however saturation increased to 96% with use of 2L O2. When ambulating the patient she dropped to 88% without oxygen use.  --Recommended patient wear home oxygen as directed (when ambulating and at night) -CXR ordered for PNA -CBC, BMP and BNP ordered for evaluation of SOB -ECHO ordered for evaluation of a cardiac cause of her SOB -Patient will need PFT's when her breathing returns to baseline  Results: -CXR without pneumonia however did show stable mild chronic diffuse prominence of parahilar interstitial markings. If patients symptoms do not improve, consider high-resolution chest CT for further work up. -CBC, BMP and BNP all wnl.

## 2016-07-07 NOTE — Assessment & Plan Note (Signed)
Patient diagnosed with obstructive sleep apnea in 2016 however does not wear her mask at night. She states its a combination of she cannot afford the mask and its uncomfortable. Encouraged to use her mask or at least wear her oxygen.

## 2016-07-08 NOTE — Progress Notes (Signed)
Internal Medicine Clinic Attending  I saw and evaluated the patient.  I personally confirmed the key portions of the history and exam documented by Dr. Danford Bad and I reviewed pertinent patient test results.  The assessment, diagnosis, and plan were formulated together and I agree with the documentation in the resident's note. I personally viewed his CXR images from 8/30 and compared to prior images eand confirmed by reading with the official read. Barrel chested, chronic lateral scoliosis, interstitial changes, Xray from 8/30 over penetrated. No sig change from prior. She has hypoxia, chronic cough, dyspnea, and fatigue. Pt is able to talk in full sentences, no accessory muscle use. Lungs clear with decent airflow. I do not think this is a wholly obstructive process. She likley has some VQ mismatch from COPD along with some diffusion impairment. Possible diff dx - cardiac (decreased CO possibly B heart failure = check ECHO), interstitial process and Dr Danford Bad has proposed CT after current round of testing, would also consider as dx of exclusion deconditioning but that would not explain hypoxia.

## 2016-07-12 ENCOUNTER — Other Ambulatory Visit: Payer: Self-pay | Admitting: Internal Medicine

## 2016-07-12 ENCOUNTER — Telehealth: Payer: Self-pay

## 2016-07-12 DIAGNOSIS — J9611 Chronic respiratory failure with hypoxia: Secondary | ICD-10-CM

## 2016-07-12 DIAGNOSIS — R109 Unspecified abdominal pain: Secondary | ICD-10-CM

## 2016-07-12 LAB — HEPATIC FUNCTION PANEL

## 2016-07-12 MED ORDER — DICYCLOMINE HCL 20 MG PO TABS: 20.0000 mg | ORAL_TABLET | Freq: Three times a day (TID) | ORAL | 0 refills | Status: DC | PRN

## 2016-07-12 NOTE — Telephone Encounter (Signed)
Request sent to pt's doctor.

## 2016-07-12 NOTE — Telephone Encounter (Signed)
Requesting lab result. Please call pt back.  

## 2016-07-12 NOTE — Telephone Encounter (Signed)
Pls sch appt [facility] or Dr Software engineer 1-2 weeks F/U stomach pain and hypoxia.  Requests something for stomach cramp and burning which occurs together and lasts few seconds then resolves but returns in another location. All over stomach - R and L and mostly lower abd. Not worse with lying down or with food although did eat with friend lat night and then had the pain. Takes GERD medicine from [facility] and this is not her GERD. Will add on LFT's and trial antispasmodic. Not really sure what this is - spasm was best I could some up with over phone. Sch appt 1-2 weeks.

## 2016-07-12 NOTE — Telephone Encounter (Signed)
Message sent to front office to schedule pt an appt. 

## 2016-07-12 NOTE — Addendum Note (Signed)
Addended by: Orson Gear on: 07/12/2016 03:43 PM   Modules accepted: Orders

## 2016-07-12 NOTE — Addendum Note (Signed)
Addended by: Orson Gear on: 07/12/2016 10:46 AM   Modules accepted: Orders

## 2016-07-21 ENCOUNTER — Ambulatory Visit (HOSPITAL_COMMUNITY)
Admission: RE | Admit: 2016-07-21 | Discharge: 2016-07-21 | Disposition: A | Discharge status: Home / Self Care | Payer: Commercial Managed Care - HMO | Source: Ambulatory Visit | Attending: Internal Medicine | Admitting: Internal Medicine

## 2016-07-21 DIAGNOSIS — Z6841 Body Mass Index (BMI) 40.0 and over, adult: Secondary | ICD-10-CM | POA: Diagnosis not present

## 2016-07-21 DIAGNOSIS — R0602 Shortness of breath: Secondary | ICD-10-CM | POA: Diagnosis not present

## 2016-07-21 DIAGNOSIS — Z87891 Personal history of nicotine dependence: Secondary | ICD-10-CM | POA: Insufficient documentation

## 2016-07-21 DIAGNOSIS — I10 Essential (primary) hypertension: Secondary | ICD-10-CM | POA: Insufficient documentation

## 2016-07-21 DIAGNOSIS — J449 Chronic obstructive pulmonary disease, unspecified: Secondary | ICD-10-CM | POA: Insufficient documentation

## 2016-07-21 MED ORDER — PERFLUTREN LIPID MICROSPHERE
1.0000 mL | INTRAVENOUS | Status: AC | PRN
  Administered 2016-07-21 (×2): 2 mL via INTRAVENOUS
  Filled 2016-07-21: qty 10

## 2016-07-21 NOTE — Progress Notes (Signed)
  Echocardiogram 2D Echocardiogram with Definity has been performed.  [name] [name] 07/21/2016, 4:37 PM

## 2016-07-25 ENCOUNTER — Telehealth: Payer: Self-pay | Admitting: Internal Medicine

## 2016-07-25 NOTE — Telephone Encounter (Signed)
APT. REMINDER CALL, LMTCB °

## 2016-07-26 ENCOUNTER — Encounter: Payer: Self-pay | Admitting: Internal Medicine

## 2016-07-26 ENCOUNTER — Ambulatory Visit (INDEPENDENT_AMBULATORY_CARE_PROVIDER_SITE_OTHER): Payer: Commercial Managed Care - HMO | Admitting: Internal Medicine

## 2016-07-26 VITALS — BP 151/78 | HR 80 | Temp 97.5°F | Ht 68.0 in | Wt 297.9 lb

## 2016-07-26 DIAGNOSIS — G4733 Obstructive sleep apnea (adult) (pediatric): Secondary | ICD-10-CM | POA: Diagnosis not present

## 2016-07-26 DIAGNOSIS — I517 Cardiomegaly: Secondary | ICD-10-CM | POA: Diagnosis not present

## 2016-07-26 DIAGNOSIS — J9611 Chronic respiratory failure with hypoxia: Secondary | ICD-10-CM

## 2016-07-26 DIAGNOSIS — R0602 Shortness of breath: Secondary | ICD-10-CM

## 2016-07-26 DIAGNOSIS — J441 Chronic obstructive pulmonary disease with (acute) exacerbation: Secondary | ICD-10-CM | POA: Diagnosis not present

## 2016-07-26 DIAGNOSIS — Z23 Encounter for immunization: Secondary | ICD-10-CM

## 2016-07-26 DIAGNOSIS — Z9981 Dependence on supplemental oxygen: Secondary | ICD-10-CM

## 2016-07-26 DIAGNOSIS — Z87891 Personal history of nicotine dependence: Secondary | ICD-10-CM

## 2016-07-26 NOTE — Progress Notes (Signed)
   CC: For her recent echo results.  HPI:  [name] is a 60 y.o. with past medical history as listed below, came to the clinic to find out her recent echo report. She had an echo done on September 14 to find out any cardiac cause of her exertional dyspnea. She had grade 1 diastolic dysfunction with mild LVH and preserved systolic function. She states that her exertional dyspnea and productive cough has improved and she is feeling much better. She hasn't history of obstructive sleep apnea but don't want to use CPAP. She is using oxygen only at night. She wants to get a portable oxygen tank so she can use oxygen with ambulation. She states that she stopped smoking 2 years ago. She needs to  pulmonary function testing done to find out the cause of her dyspnea.  Past Medical History:  Diagnosis Date  . Anxiety   . Asthma    Chronic, with restrictive component 2/2 obesity  . Depression   . GERD (gastroesophageal reflux disease)   . Headache   . Insomnia   . Obesity   . On home oxygen therapy    "3L at night only when I get sick" (04/30/2014)  . Shortness of breath dyspnea   . Sleep apnea    "couldn't afford mask so I didn't get it" (04/30/2014)    Review of Systems:  As per HPI>  Physical Exam:  Vitals:   07/26/16 1419  BP: (!) 151/78  Pulse: 80  Temp: 97.5 F (36.4 C)  TempSrc: Oral  SpO2: 92%  Weight: 297 lb 14.4 oz (135.1 kg)  Height: 5\' 8"  (1.727 m)   Gen. Well-built, well-nourished lady, in no acute distress Lungs. Few scattered inspiratory crackles. No wheezing. CV. RRR. Abdomen. Soft, nontender, bowel sounds positive. Extremities. No edema, no cyanosis, pulses 2+ bilaterally.  Assessment & Plan:   See Encounters Tab for problem based charting.  Patient seen with Dr. Daryll Drown.

## 2016-07-26 NOTE — Patient Instructions (Signed)
Thank you for visiting clinic today. We're sending you for pulmonary function test. I'm also sending an order for portable oxygen tank today. Please you're your oxygen while sleeping and when you are up on your feet. We will review flu shot today. Please follow up in clinic after your pulmonary function testing.

## 2016-07-26 NOTE — Assessment & Plan Note (Signed)
She has an history of obstructive sleep apnea but don't want to use CPAP. She states that she is using her oxygen at night.

## 2016-07-26 NOTE — Assessment & Plan Note (Signed)
She had an echo done on September 14 to find out any cardiac cause of her shortness of breath. Her echo results are Impressions: - Normal LV size with mild LV hypertrophy. EF 55-60%. Mildly   dilated RV with normal systolic function. No significant valvular   abnormalities.  As her shortness of breath was improved today, we refer her for pulmonary function testing.

## 2016-07-26 NOTE — Assessment & Plan Note (Signed)
Her productive cough and shortness of breath has improved. She just had few scattered inspiratory crackles on exam today.  Continue with the current management

## 2016-07-26 NOTE — Assessment & Plan Note (Signed)
She should be using home oxygen on ambulation and at night. She do not have a portable tank.  We prescribed her a portable tank so she can use oxygen with ambulation. She is using oxygen at night. She was having and 92% saturation on room air today.

## 2016-08-02 NOTE — Progress Notes (Signed)
Internal Medicine Clinic Attending  I saw and evaluated the patient.  I personally confirmed the key portions of the history and exam documented by Dr. Amin and I reviewed pertinent patient test results.  The assessment, diagnosis, and plan were formulated together and I agree with the documentation in the resident's note. 

## 2016-08-06 DIAGNOSIS — J449 Chronic obstructive pulmonary disease, unspecified: Secondary | ICD-10-CM | POA: Diagnosis not present

## 2016-08-08 ENCOUNTER — Ambulatory Visit (HOSPITAL_COMMUNITY)
Admission: RE | Admit: 2016-08-08 | Discharge: 2016-08-08 | Disposition: A | Discharge status: Home / Self Care | Payer: Commercial Managed Care - HMO | Source: Ambulatory Visit | Attending: Internal Medicine | Admitting: Internal Medicine

## 2016-08-08 DIAGNOSIS — J9611 Chronic respiratory failure with hypoxia: Secondary | ICD-10-CM

## 2016-08-08 DIAGNOSIS — I2510 Atherosclerotic heart disease of native coronary artery without angina pectoris: Secondary | ICD-10-CM | POA: Insufficient documentation

## 2016-08-08 DIAGNOSIS — I700 Atherosclerosis of aorta: Secondary | ICD-10-CM | POA: Diagnosis not present

## 2016-08-08 DIAGNOSIS — R918 Other nonspecific abnormal finding of lung field: Secondary | ICD-10-CM | POA: Insufficient documentation

## 2016-08-08 DIAGNOSIS — R0600 Dyspnea, unspecified: Secondary | ICD-10-CM | POA: Diagnosis not present

## 2016-08-17 ENCOUNTER — Other Ambulatory Visit: Payer: Self-pay | Admitting: Internal Medicine

## 2016-08-17 ENCOUNTER — Encounter: Payer: Self-pay | Admitting: Internal Medicine

## 2016-08-17 DIAGNOSIS — I700 Atherosclerosis of aorta: Secondary | ICD-10-CM | POA: Insufficient documentation

## 2016-08-18 ENCOUNTER — Encounter: Payer: Self-pay | Admitting: Internal Medicine

## 2016-08-18 ENCOUNTER — Ambulatory Visit (INDEPENDENT_AMBULATORY_CARE_PROVIDER_SITE_OTHER): Payer: Commercial Managed Care - HMO | Admitting: Internal Medicine

## 2016-08-18 VITALS — BP 128/65 | HR 84 | Temp 98.0°F | Wt 301.5 lb

## 2016-08-18 DIAGNOSIS — F339 Major depressive disorder, recurrent, unspecified: Secondary | ICD-10-CM

## 2016-08-18 DIAGNOSIS — Z0000 Encounter for general adult medical examination without abnormal findings: Secondary | ICD-10-CM

## 2016-08-18 DIAGNOSIS — I700 Atherosclerosis of aorta: Secondary | ICD-10-CM

## 2016-08-18 DIAGNOSIS — J45909 Unspecified asthma, uncomplicated: Secondary | ICD-10-CM | POA: Diagnosis not present

## 2016-08-18 DIAGNOSIS — R21 Rash and other nonspecific skin eruption: Secondary | ICD-10-CM | POA: Diagnosis not present

## 2016-08-18 DIAGNOSIS — G4761 Periodic limb movement disorder: Secondary | ICD-10-CM | POA: Diagnosis not present

## 2016-08-18 DIAGNOSIS — J4520 Mild intermittent asthma, uncomplicated: Secondary | ICD-10-CM

## 2016-08-18 DIAGNOSIS — F329 Major depressive disorder, single episode, unspecified: Secondary | ICD-10-CM

## 2016-08-18 NOTE — Assessment & Plan Note (Signed)
She uses bentyl for GI muscle cramps.

## 2016-08-18 NOTE — Assessment & Plan Note (Signed)
She feels her pul sxs / asthma are back to baseline. Her baseline isn't great - DOE when walking from ED parking lot to [facility]. can't do household chores without stopping to get breath. Cleaning a small bathroom may takes hrs. She uses her Dulera "PRN". She uses albuterol PRN - less than 2 times per week. She has a neb but only uses it when she gets "sick".   ASSESSMENT : her alb use puts her at intermittent but her sxs put her at persistent asthma - likely moderate. However, her DOE could be multifactorial with obesity and deconditioning contributing.   PLAN : Pul rehab. consider repeating PFT's after pul rehab

## 2016-08-18 NOTE — Patient Instructions (Signed)
1. Schedule an [facility] appt when I am attending for L lower leg skin biopsy  2. See me in 4-6 months  3. I am looking into pulmonary rehabilitation

## 2016-08-18 NOTE — Assessment & Plan Note (Signed)
Incidental finding on CT 2017. I reviewed results with pt and understands dx bc mother had it. No sxs.  PLAN : RF mgmt.

## 2016-08-18 NOTE — Progress Notes (Signed)
   Subjective:    Patient ID: [name] [name], female    DOB: 1956-10-14, 60 y.o.   MRN: [id number]  HPI  [name] [name] is here for dyspnea F/U. Please see the A&P for the status of the pt's chronic medical problems.  ROS : per ROS section and in problem oriented charting. All other systems are negative.  PMHx, Soc hx, and / or Fam hx : Married. Lives with husband and son. Terrified of riding in a vehicle.   Review of Systems  Respiratory:       +DOE Cough, fatigue, hypoxia all better  Skin: Positive for color change and rash.  Neurological:       RLS sxs better on requip  Psychiatric/Behavioral: The patient is nervous/anxious.        Objective:   Physical Exam  Constitutional: She is oriented to person, place, and time. She appears well-developed and well-nourished. No distress.  HENT:  Head: Normocephalic and atraumatic.  Right Ear: External ear normal.  Left Ear: External ear normal.  Nose: Nose normal.  Eyes: Conjunctivae and EOM are normal.  Neck: Normal range of motion. No thyromegaly present.  Cardiovascular:  Radial pulse nl B  Pulmonary/Chest: Breath sounds normal. No respiratory distress. She has no wheezes.  Neurological: She is alert and oriented to person, place, and time.  Skin: Skin is warm and dry. She is not diaphoretic.  1 x 4 cm scaly lesion with small surrounding erythema L lateral ankle. Well healed bx sites R forearm and RLL  Psychiatric: She has a normal mood and affect. Her behavior is normal. Judgment and thought content normal.          Assessment & Plan:

## 2016-08-18 NOTE — Assessment & Plan Note (Signed)
The R forearm and R leg biopsy sites well healed. 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 Today, she asks me to look at L lateral ankle with plaque. She showed it to the derm who reportedly told her to stop scratching it. Derm's notes state told her to stop scratching and to use the steroid cream. I am concerned about MF. I have rec that she return for a punch bx.  PLAN : punch bx

## 2016-08-18 NOTE — Assessment & Plan Note (Signed)
Pt has noticed sig improvement in sxs on her requip and would like to con the medicine  PLAN : cont requip

## 2016-08-18 NOTE — Assessment & Plan Note (Signed)
Still going to [facility] for therapy and cont on zoloft and wellbutrin with xanax PRN.   PLAN : cont to offer support.

## 2016-08-23 ENCOUNTER — Ambulatory Visit: Payer: Self-pay

## 2016-08-25 NOTE — Addendum Note (Signed)
Addended by: Hulan Fray on: 08/25/2016 08:03 PM   Modules accepted: Orders

## 2016-08-30 ENCOUNTER — Telehealth: Payer: Self-pay | Admitting: Internal Medicine

## 2016-08-30 NOTE — Telephone Encounter (Signed)
APT. REMINDER CALL, LMTCB °

## 2016-08-31 ENCOUNTER — Ambulatory Visit: Payer: Self-pay

## 2016-09-01 ENCOUNTER — Ambulatory Visit (INDEPENDENT_AMBULATORY_CARE_PROVIDER_SITE_OTHER): Payer: Commercial Managed Care - HMO | Admitting: Internal Medicine

## 2016-09-01 ENCOUNTER — Encounter: Payer: Self-pay | Admitting: Internal Medicine

## 2016-09-01 VITALS — BP 135/97 | HR 81 | Temp 98.2°F | Ht 68.0 in | Wt 300.9 lb

## 2016-09-01 NOTE — Progress Notes (Signed)
   CC: Rash left ankle  HPI:  [name] [name] is a 60 y.o. female with PMH as listed below who presents for evaluation of a rash on her left ankle.  Patient first noticed a scaly dry rash on her left lateral ankle about 2 years ago without significant change in size and shape. She notices occasional purplish tinge discoloration. It is occasionally pruritic but she says she does not scratch the area. She has tried steroid creams without relief. She has a similar appearing rash just distal to her right elbow on the posterior surface of the arm. 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  Past Medical History:  Diagnosis Date  . Anxiety   . Asthma    Chronic, with restrictive component 2/2 obesity  . Depression   . GERD (gastroesophageal reflux disease)   . Headache   . Insomnia   . Obesity   . On home oxygen therapy    "3L at night only when I get sick" (04/30/2014)  . Shortness of breath dyspnea   . Sleep apnea    "couldn't afford mask so I didn't get it" (04/30/2014)    Review of Systems:   Review of Systems  Skin: Positive for rash.       Dry scaly rash left lateral ankle, right elbow. No drainage, discharge, bleeding.     Physical Exam:  Vitals:   09/01/16 1332  BP: (!) 135/97  Pulse: 81  Temp: 98.2 F (36.8 C)  TempSrc: Oral  SpO2: 92%  Weight: (!) 300 lb 14.4 oz (136.5 kg)  Height: 5\' 8"  (1.727 m)    Physical Exam  Constitutional: She appears well-developed and well-nourished. No distress.  Skin:  Dry scaly/flaky 1x4 cm plaque left lateral ankle without bleeding or erythema, similar appearing rash on right elbow.      Assessment & Plan:   See Encounters Tab for problem based charting.  Patient to reschedule due to scheduling conflict this visit.

## 2016-09-05 DIAGNOSIS — J449 Chronic obstructive pulmonary disease, unspecified: Secondary | ICD-10-CM | POA: Diagnosis not present

## 2016-10-06 DIAGNOSIS — J449 Chronic obstructive pulmonary disease, unspecified: Secondary | ICD-10-CM | POA: Diagnosis not present

## 2016-10-11 ENCOUNTER — Telehealth: Payer: Self-pay | Admitting: Internal Medicine

## 2016-10-11 NOTE — Telephone Encounter (Signed)
APT. REMINDER CALL, LMTCB °

## 2016-10-12 ENCOUNTER — Ambulatory Visit: Payer: Commercial Managed Care - HMO

## 2016-11-05 DIAGNOSIS — J449 Chronic obstructive pulmonary disease, unspecified: Secondary | ICD-10-CM | POA: Diagnosis not present

## 2016-11-14 DIAGNOSIS — F39 Unspecified mood [affective] disorder: Secondary | ICD-10-CM | POA: Diagnosis not present

## 2016-11-15 ENCOUNTER — Ambulatory Visit (INDEPENDENT_AMBULATORY_CARE_PROVIDER_SITE_OTHER): Payer: Medicare HMO | Admitting: Internal Medicine

## 2016-11-15 DIAGNOSIS — R05 Cough: Secondary | ICD-10-CM

## 2016-11-15 DIAGNOSIS — Z87891 Personal history of nicotine dependence: Secondary | ICD-10-CM | POA: Diagnosis not present

## 2016-11-15 DIAGNOSIS — Z7722 Contact with and (suspected) exposure to environmental tobacco smoke (acute) (chronic): Secondary | ICD-10-CM

## 2016-11-15 DIAGNOSIS — R058 Other specified cough: Secondary | ICD-10-CM

## 2016-11-15 DIAGNOSIS — Z9114 Patient's other noncompliance with medication regimen: Secondary | ICD-10-CM

## 2016-11-15 DIAGNOSIS — J441 Chronic obstructive pulmonary disease with (acute) exacerbation: Secondary | ICD-10-CM

## 2016-11-15 MED ORDER — AZITHROMYCIN 250 MG PO TABS: ORAL_TABLET | ORAL | 0 refills | Status: DC

## 2016-11-15 MED ORDER — BENZONATATE 100 MG PO CAPS: 100.0000 mg | ORAL_CAPSULE | Freq: Four times a day (QID) | ORAL | 0 refills | Status: AC | PRN

## 2016-11-15 MED ORDER — PREDNISONE 20 MG PO TABS: 40.0000 mg | ORAL_TABLET | Freq: Every day | ORAL | 0 refills | Status: AC

## 2016-11-15 NOTE — Progress Notes (Signed)
   CC: Cough  HPI:  [name] is a 61 y.o. female with a past medical history listed below here today with complaints of cough.  3 weeks of productive cough. Thick yellow/green mucus. Worsening shortness of breath. Reports had to stop two times walking from her car to clinic to catch her breath.  Use oxygen at night, 3L. Does not use any oxygen during the day. Uses Dulera at home. Reports she does not use it at all when she is not feeling sick. Says that she has been using the [facility] twice a day for the past three weeks. Reports she does not like the albuterol and does not use it at all.  Reports that she was going to pulmonary rehab but insurance would not pay for it and has not been going.  Former smoker but reports her husband still smokes in the house.  Subjective fevers at home and chills. No sinus congestion/pressure, rhinorrhea, sore throat, ear pain.   Past Medical History:  Diagnosis Date  . Anxiety   . Asthma    Chronic, with restrictive component 2/2 obesity  . Depression   . GERD (gastroesophageal reflux disease)   . Headache   . Insomnia   . Obesity   . On home oxygen therapy    "3L at night only when I get sick" (04/30/2014)  . Shortness of breath dyspnea   . Sleep apnea    "couldn't afford mask so I didn't get it" (04/30/2014)    Review of Systems:   Negative except as noted in HPI  Physical Exam:  Vitals:   11/15/16 1429  BP: 125/78  Pulse: 88  Temp: 97.6 F (36.4 C)  TempSrc: Oral  SpO2: 92%  Weight: (!) 308 lb (139.7 kg)   Physical Exam  Constitutional: She is well-developed, well-nourished, and in no distress. No distress.  HENT:  Head: Normocephalic and atraumatic.  Eyes: Conjunctivae are normal. Pupils are equal, round, and reactive to light.  Cardiovascular: Normal rate and regular rhythm.   Pulmonary/Chest: Effort normal and breath sounds normal. No respiratory distress. She has no wheezes. She has no rales.  Abdominal: Soft. Bowel  sounds are normal.  Lymphadenopathy:    She has no cervical adenopathy.  Skin: Skin is warm and dry.    Assessment & Plan:   See Encounters Tab for problem based charting.  Patient discussed with Dr. Angelia Mould

## 2016-11-15 NOTE — Assessment & Plan Note (Signed)
3 weeks of productive cough. Thick yellow/green mucus. Worsening shortness of breath. Reports had to stop two times walking from her car to clinic to catch her breath.  Use oxygen at night, 3L. Does not use any oxygen during the day. Uses Dulera at home. Reports she does not use it at all when she is not feeling sick. Says that she has been using the [facility] twice a day for the past three weeks. Reports she does not like the albuterol and does not use it at all.  Reports that she was going to pulmonary rehab but insurance would not pay for it and has not been going.  Former smoker but reports her husband still smokes in the house.  Subjective fevers at home and chills. No sinus congestion/pressure, rhinorrhea, sore throat, ear pain.   Plan Patient with PFTs in 2014 more suggestive of Asthma than COPD. Does have a smoking history but it was felt at that time that her morbid obesity was more a component than true COPD. Symptoms today and her prior presentations more consistent with COPD exacerbation.  She is non-compliant with her inhalers and recommendations in general. Will treat for COPD exacerbation today with prednisone 40 mg x 5 days, Azithromycin x 5 days and Tessalon perles as needed Will need repeat PFTs at some point but has been unwilling to go in the past.

## 2016-11-15 NOTE — Patient Instructions (Addendum)
[name] [name],  I am going to treat you with an antibitoic, Azitrhomycin, steroids and a cough suppresant. Take the Azithromycin 2 tablets today and then one tablet daily thereafter. Take 2 tablets of prednisone daily x 5 days. Take the [facility] as needed for cough.  It is important that you take the [facility] twice a day EVERYDAY even when you feel well to prevent these symptoms from reoccurring. The albuterol is for when you start having difficulties breathing on an as needed basis.

## 2016-11-16 NOTE — Progress Notes (Signed)
Internal Medicine Clinic Attending  Case discussed with Dr. Boswell at the time of the visit.  We reviewed the resident's history and exam and pertinent patient test results.  I agree with the assessment, diagnosis, and plan of care documented in the resident's note.  

## 2016-12-06 DIAGNOSIS — J449 Chronic obstructive pulmonary disease, unspecified: Secondary | ICD-10-CM | POA: Diagnosis not present

## 2016-12-07 ENCOUNTER — Encounter: Payer: Self-pay | Admitting: *Deleted

## 2017-01-04 DIAGNOSIS — J449 Chronic obstructive pulmonary disease, unspecified: Secondary | ICD-10-CM | POA: Diagnosis not present

## 2017-02-03 DIAGNOSIS — J449 Chronic obstructive pulmonary disease, unspecified: Secondary | ICD-10-CM | POA: Diagnosis not present

## 2017-05-01 DIAGNOSIS — F39 Unspecified mood [affective] disorder: Secondary | ICD-10-CM | POA: Diagnosis not present

## 2017-06-12 DIAGNOSIS — F411 Generalized anxiety disorder: Secondary | ICD-10-CM | POA: Diagnosis not present

## 2017-06-12 DIAGNOSIS — F39 Unspecified mood [affective] disorder: Secondary | ICD-10-CM | POA: Diagnosis not present

## 2017-06-15 ENCOUNTER — Emergency Department (HOSPITAL_COMMUNITY)
Admission: EM | Admit: 2017-06-15 | Discharge: 2017-06-15 | Disposition: A | Discharge status: Home / Self Care | Payer: Medicare HMO | Attending: Emergency Medicine | Admitting: Emergency Medicine

## 2017-06-15 ENCOUNTER — Emergency Department (HOSPITAL_COMMUNITY): Payer: Medicare HMO

## 2017-06-15 ENCOUNTER — Encounter (HOSPITAL_COMMUNITY): Payer: Self-pay

## 2017-06-15 DIAGNOSIS — S8991XA Unspecified injury of right lower leg, initial encounter: Secondary | ICD-10-CM | POA: Diagnosis present

## 2017-06-15 DIAGNOSIS — Y999 Unspecified external cause status: Secondary | ICD-10-CM | POA: Insufficient documentation

## 2017-06-15 DIAGNOSIS — J45909 Unspecified asthma, uncomplicated: Secondary | ICD-10-CM | POA: Diagnosis not present

## 2017-06-15 DIAGNOSIS — S8011XA Contusion of right lower leg, initial encounter: Secondary | ICD-10-CM

## 2017-06-15 DIAGNOSIS — Y9389 Activity, other specified: Secondary | ICD-10-CM | POA: Insufficient documentation

## 2017-06-15 DIAGNOSIS — Y9289 Other specified places as the place of occurrence of the external cause: Secondary | ICD-10-CM | POA: Diagnosis not present

## 2017-06-15 DIAGNOSIS — Z79899 Other long term (current) drug therapy: Secondary | ICD-10-CM | POA: Insufficient documentation

## 2017-06-15 DIAGNOSIS — W208XXA Other cause of strike by thrown, projected or falling object, initial encounter: Secondary | ICD-10-CM | POA: Insufficient documentation

## 2017-06-15 DIAGNOSIS — Z87891 Personal history of nicotine dependence: Secondary | ICD-10-CM | POA: Diagnosis not present

## 2017-06-15 MED ORDER — BACITRACIN-NEOMYCIN-POLYMYXIN 400-5-5000 EX OINT
TOPICAL_OINTMENT | CUTANEOUS | Status: AC
  Filled 2017-06-15: qty 1

## 2017-06-15 MED ORDER — BACITRACIN-NEOMYCIN-POLYMYXIN 400-5-5000 EX OINT
TOPICAL_OINTMENT | Freq: Once | CUTANEOUS | Status: AC
  Administered 2017-06-15: 18:00:00 via TOPICAL

## 2017-06-15 NOTE — ED Provider Notes (Signed)
[facility] DEPT Provider Note   CSN: [id number] Arrival date & time: 06/15/17  1554     History   Chief Complaint Chief Complaint  Patient presents with  . Leg Pain    HPI [name] [name] is a 61 y.o. female presenting with pain and bruising of her right anterior tibia and ankle from a direct blow prior to arrival.  She was lifting a camp stove from a shopping cart just prior to arrival when she dropped on her right leg causing injury.  She denies radiation of pain which is localized to her upper anterior tibia and her lower tibia, the 2 places the stove struck.  She has had no medications prior to arrival but was given an ice pack here and she endorses improving swelling already. Her tetanus is current.  The history is provided by the patient.    Past Medical History:  Diagnosis Date  . Anxiety   . Asthma    Chronic, with restrictive component 2/2 obesity  . Depression   . GERD (gastroesophageal reflux disease)   . Headache   . Insomnia   . Obesity   . On home oxygen therapy    "3L at night only when I get sick" (04/30/2014)  . Shortness of breath dyspnea   . Sleep apnea    "couldn't afford mask so I didn't get it" (04/30/2014)    Patient Active Problem List   Diagnosis Date Noted  . Productive cough 11/15/2016  . Aortic atherosclerosis ([place]) 08/17/2016  . Rash and nonspecific skin eruption 12/17/2015  . Periodic limb movement disorder 08/14/2015  . Chronic respiratory failure ([place]) 01/21/2015  . Obesity hypoventilation syndrome ([place]) 01/21/2015  . Health care maintenance 07/01/2014  . Asthma, chronic 05/28/2013  . OSA (obstructive sleep apnea) 04/04/2013  . Anxiety disorder 07/02/2012  . Depression 06/28/2012  . Morbid obesity ([place]) 03/02/2012  . GERD (gastroesophageal reflux disease) 02/05/2012    Past Surgical History:  Procedure Laterality Date  . APPENDECTOMY  ~ 2002  . VAGINAL HYSTERECTOMY  ~ 1978    OB History    No data available        Home Medications    Prior to Admission medications   Medication Sig Start Date End Date Taking? Authorizing Provider  acetaminophen (TYLENOL) 500 MG tablet Take 1,000 mg by mouth every 6 (six) hours as needed for moderate pain (back pain).    [provider]  albuterol (PROVENTIL HFA;VENTOLIN HFA) 108 (90 BASE) MCG/ACT inhaler Inhale 2 puffs into the lungs every 6 (six) hours as needed for wheezing or shortness of breath (wheezing).    [provider]  albuterol (PROVENTIL) (5 MG/ML) 0.5% nebulizer solution Take 2.5 mg by nebulization every 6 (six) hours as needed for wheezing or shortness of breath (wheezing).     [provider]  ALPRAZolam Duanne Moron) 0.5 MG tablet Take 1 tablet (0.5 mg total) by mouth at bedtime as needed for anxiety (insomnia). 04/27/15   Luan Moore, MD  azithromycin (ZITHROMAX) 250 MG tablet Take 2 tablets (500 mg) the first day, then 1 tablet (250 mg) daily for 4 days. 11/15/16   Maryellen Pile, MD  benzonatate (TESSALON PERLES) 100 MG capsule Take 1 capsule (100 mg total) by mouth every 6 (six) hours as needed for cough. 11/15/16 11/15/17  Maryellen Pile, MD  betamethasone dipropionate (DIPROLENE) 0.05 % cream Apply topically 2 (two) times daily. 01/21/16   Bartholomew Crews, MD  buPROPion (WELLBUTRIN XL) 150 MG 24 hr tablet Take  1 tablet by mouth every morning. 06/26/15   [provider]  dicyclomine (BENTYL) 20 MG tablet Take 1 tablet (20 mg total) by mouth 3 (three) times daily as needed for spasms. 07/12/16 07/12/17  Bartholomew Crews, MD  nystatin (MYCOSTATIN/NYSTOP) 100000 UNIT/GM POWD Please apply to skin under breasts twice daily until rash resolves. 12/17/15   Corky Sox, MD  predniSONE (DELTASONE) 20 MG tablet Take 2 tablets (40 mg total) by mouth daily. 11/15/16 11/15/17  Maryellen Pile, MD  rOPINIRole (REQUIP) 0.25 MG tablet Take 1 tablet (0.25 mg total) by mouth at bedtime. 11/24/15   Bartholomew Crews, MD  sertraline  (ZOLOFT) 100 MG tablet Take 100 mg by mouth 2 (two) times daily.    [provider]    Family History Family History  Problem Relation Age of Onset  . Breast cancer Mother   . Alcohol abuse Mother   . Emphysema Mother        smoked  . Asthma Mother   . Breast cancer Maternal Grandmother   . Breast cancer Maternal Aunt     Social History Social History  Substance Use Topics  . Smoking status: Former Smoker    Packs/day: 0.00    Years: 44.00    Quit date: 11/07/2012  . Smokeless tobacco: Never Used  . Alcohol use No     Allergies   Ibuprofen   Review of Systems Review of Systems  Musculoskeletal: Positive for arthralgias. Negative for joint swelling.  Skin: Positive for color change and wound.  Neurological: Negative for weakness and numbness.     Physical Exam Updated Vital Signs BP 104/76 (BP Location: Right Arm)   Pulse 89   Temp 98.1 F (36.7 C) (Oral)   Resp 18   Ht 5\' 8"  (1.727 m)   Wt (!) 154.2 kg (340 lb)   SpO2 95%   BMI 51.70 kg/m   Physical Exam  Constitutional: She appears well-developed and well-nourished.  HENT:  Head: Atraumatic.  Neck: Normal range of motion.  Cardiovascular:  Pulses:      Dorsalis pedis pulses are 2+ on the right side, and 2+ on the left side.  Pulses equal bilaterally  Musculoskeletal: She exhibits tenderness.       Legs: Proximal right tibia contusion, distal right tibia abrasion.    Neurological: She is alert. She has normal strength. She displays normal reflexes. No sensory deficit.  Skin: Skin is warm and dry.  Psychiatric: She has a normal mood and affect.     ED Treatments / Results  Labs (all labs ordered are listed, but only abnormal results are displayed) Labs Reviewed - No data to display  EKG  EKG Interpretation None       Radiology Dg Tibia/fibula Right  Result Date: 06/15/2017 CLINICAL DATA:  Right she EN bruising after injury. EXAM: RIGHT TIBIA AND FIBULA - 2 VIEW COMPARISON:   Radiographs of May 14, 2014. FINDINGS: There is no evidence of fracture or other focal bone lesions. Soft tissues are unremarkable. IMPRESSION: Normal right tibia and fibula. Electronically Signed   By: Marijo Conception, M.D.   On: 06/15/2017 16:44    Procedures Procedures (including critical care time)  Medications Ordered in ED Medications  neomycin-bacitracin-polymyxin (NEOSPORIN) [id number] ointment (not administered)  neomycin-bacitracin-polymyxin (NEOSPORIN) ointment ( Topical Given 06/15/17 1750)     Initial Impression / Assessment and Plan / ED Course  I have reviewed the triage vital signs and the nursing notes.  Pertinent labs &  imaging results that were available during my care of the patient were reviewed by me and considered in my medical decision making (see chart for details).     Imaging reviewed and discussed. RICE, jones dressing provided. Recheck by pcp prn.    The patient appears reasonably screened and/or stabilized for discharge and I doubt any other medical condition or other [facility] requiring further screening, evaluation, or treatment in the ED at this time prior to discharge.   Final Clinical Impressions(s) / ED Diagnoses   Final diagnoses:  Contusion of right leg, initial encounter    New Prescriptions Discharge Medication List as of 06/15/2017  5:23 PM       Evalee Jefferson, PA-C 06/15/17 1919    Daleen Bo, MD 06/15/17 [phone]

## 2017-06-15 NOTE — Discharge Instructions (Signed)
Ice and elevation will help with pain and swelling. Apply neosporin twice daily after washing the abrasion with warm soapy water.  Your xrays are negative for acute injury today.

## 2017-06-15 NOTE — ED Triage Notes (Signed)
Pt reports was lifting a camping stove and it fell and hit pt's r shin and top of r foot.  Bruising to shin and abrasion to top of r foot/ankle.

## 2017-06-29 ENCOUNTER — Ambulatory Visit: Payer: Self-pay | Admitting: Internal Medicine

## 2017-07-11 DIAGNOSIS — F39 Unspecified mood [affective] disorder: Secondary | ICD-10-CM | POA: Diagnosis not present

## 2017-07-27 DIAGNOSIS — K130 Diseases of lips: Secondary | ICD-10-CM | POA: Diagnosis not present

## 2017-07-27 DIAGNOSIS — B372 Candidiasis of skin and nail: Secondary | ICD-10-CM | POA: Diagnosis not present

## 2017-08-10 ENCOUNTER — Ambulatory Visit: Payer: Self-pay | Admitting: Internal Medicine

## 2017-10-05 ENCOUNTER — Encounter: Payer: Self-pay | Admitting: Internal Medicine

## 2017-10-05 ENCOUNTER — Ambulatory Visit: Payer: Self-pay | Admitting: Internal Medicine

## 2017-11-02 DIAGNOSIS — F39 Unspecified mood [affective] disorder: Secondary | ICD-10-CM | POA: Diagnosis not present

## 2017-12-08 ENCOUNTER — Telehealth: Payer: Self-pay | Admitting: Dietician

## 2017-12-11 ENCOUNTER — Encounter: Payer: Self-pay | Admitting: Dietician

## 2017-12-11 NOTE — Telephone Encounter (Signed)
I had asked Doris or front desk to contact to see if she had transferred care since not in for quite some time.

## 2017-12-11 NOTE — Telephone Encounter (Signed)
[name] [name] says she was contacted by our office and told to speak with me and her call was transferred. I do not see a referral or a note indicating she should speak with me.  I told her she was probably due to a yearly check up and if so, the front office would be in touch with her.

## 2017-12-11 NOTE — Telephone Encounter (Signed)
Thanks

## 2017-12-11 NOTE — Telephone Encounter (Signed)
GA.  Spoke with the patient this afternoon.  Pt has not transferred her care but, has had a lot of things going on where she could not come and still wants to be a patient of yours.  She has scheduled an appointment of 01/18/2018 to see you.

## 2017-12-13 ENCOUNTER — Telehealth: Payer: Self-pay

## 2017-12-13 NOTE — Telephone Encounter (Signed)
ERROR

## 2017-12-14 DIAGNOSIS — F39 Unspecified mood [affective] disorder: Secondary | ICD-10-CM | POA: Diagnosis not present

## 2018-01-18 ENCOUNTER — Encounter: Payer: Self-pay | Admitting: Internal Medicine

## 2018-01-18 ENCOUNTER — Ambulatory Visit (HOSPITAL_COMMUNITY)
Admission: RE | Admit: 2018-01-18 | Discharge: 2018-01-18 | Disposition: A | Discharge status: Home / Self Care | Payer: Medicare HMO | Source: Ambulatory Visit | Attending: Internal Medicine | Admitting: Internal Medicine

## 2018-01-18 ENCOUNTER — Ambulatory Visit (INDEPENDENT_AMBULATORY_CARE_PROVIDER_SITE_OTHER): Payer: Medicare HMO | Admitting: Internal Medicine

## 2018-01-18 ENCOUNTER — Other Ambulatory Visit: Payer: Self-pay

## 2018-01-18 VITALS — BP 109/56 | HR 81 | Temp 97.8°F | Ht 67.0 in | Wt 327.6 lb

## 2018-01-18 DIAGNOSIS — J45909 Unspecified asthma, uncomplicated: Secondary | ICD-10-CM | POA: Diagnosis not present

## 2018-01-18 DIAGNOSIS — Z6841 Body Mass Index (BMI) 40.0 and over, adult: Secondary | ICD-10-CM | POA: Diagnosis not present

## 2018-01-18 DIAGNOSIS — R450 Nervousness: Secondary | ICD-10-CM

## 2018-01-18 DIAGNOSIS — L409 Psoriasis, unspecified: Secondary | ICD-10-CM

## 2018-01-18 DIAGNOSIS — Z79899 Other long term (current) drug therapy: Secondary | ICD-10-CM | POA: Diagnosis not present

## 2018-01-18 DIAGNOSIS — R0902 Hypoxemia: Secondary | ICD-10-CM

## 2018-01-18 DIAGNOSIS — Z23 Encounter for immunization: Secondary | ICD-10-CM

## 2018-01-18 DIAGNOSIS — L859 Epidermal thickening, unspecified: Secondary | ICD-10-CM | POA: Diagnosis not present

## 2018-01-18 DIAGNOSIS — F411 Generalized anxiety disorder: Secondary | ICD-10-CM

## 2018-01-18 DIAGNOSIS — G4761 Periodic limb movement disorder: Secondary | ICD-10-CM

## 2018-01-18 DIAGNOSIS — K219 Gastro-esophageal reflux disease without esophagitis: Secondary | ICD-10-CM

## 2018-01-18 DIAGNOSIS — R21 Rash and other nonspecific skin eruption: Secondary | ICD-10-CM

## 2018-01-18 DIAGNOSIS — M4182 Other forms of scoliosis, cervical region: Secondary | ICD-10-CM | POA: Diagnosis not present

## 2018-01-18 DIAGNOSIS — Z1239 Encounter for other screening for malignant neoplasm of breast: Secondary | ICD-10-CM

## 2018-01-18 DIAGNOSIS — F419 Anxiety disorder, unspecified: Secondary | ICD-10-CM | POA: Diagnosis not present

## 2018-01-18 DIAGNOSIS — E662 Morbid (severe) obesity with alveolar hypoventilation: Secondary | ICD-10-CM | POA: Diagnosis not present

## 2018-01-18 DIAGNOSIS — Z0000 Encounter for general adult medical examination without abnormal findings: Secondary | ICD-10-CM

## 2018-01-18 DIAGNOSIS — G4733 Obstructive sleep apnea (adult) (pediatric): Secondary | ICD-10-CM

## 2018-01-18 DIAGNOSIS — R0602 Shortness of breath: Secondary | ICD-10-CM | POA: Diagnosis not present

## 2018-01-18 DIAGNOSIS — R7309 Other abnormal glucose: Secondary | ICD-10-CM

## 2018-01-18 DIAGNOSIS — I700 Atherosclerosis of aorta: Secondary | ICD-10-CM | POA: Diagnosis not present

## 2018-01-18 LAB — GLUCOSE, CAPILLARY: Glucose-Capillary: 108 mg/dL — ABNORMAL HIGH (ref 65–99)

## 2018-01-18 LAB — POCT GLYCOSYLATED HEMOGLOBIN (HGB A1C): Hemoglobin A1C: 5.5

## 2018-01-18 MED ORDER — ROPINIROLE HCL 0.25 MG PO TABS: 0.2500 mg | ORAL_TABLET | Freq: Every day | ORAL | 3 refills | Status: DC

## 2018-01-18 MED ORDER — ALBUTEROL SULFATE HFA 108 (90 BASE) MCG/ACT IN AERS: 2.0000 | INHALATION_SPRAY | Freq: Four times a day (QID) | RESPIRATORY_TRACT | 3 refills | Status: DC | PRN

## 2018-01-18 NOTE — Assessment & Plan Note (Addendum)
This problem is chronic and worsening.  She had seen a dermatologist about 18 months ago in 2017 and had a right forearm skin biopsy which differential diagnosis included mycosis fungoides and a drug eruption.  She has tried over-the-counter topical corticosteroids and a prescription corticosteroid from a dermatologist without any significant relief.  She now has additional skin abnormalities.  Right elbow -shows about a 1.5 cm well-circumscribed erythematous plaque with white scales, consistent with psoriasis.  Her left elbow, knees, and the gluteal areas are clear of psoriatic lesions.  R palm - her palm has 2 areas of hyperkeratotic plaque but no deep fissures. No symmetric lesions on L palm.  Dorsal R hand - there are violaceous/erythematous plaques with maybe faint scaling over the hand joints, primarily MCP. Violaceous 1 cm plaques over the dorsal hand extending to mid forearm. There might be some faint scaling on top.  No discrete hyper keratotic changes to the lateral surfaces of the fingers.  Left lower leg superior and anterior to the left lateral malleolus there is a 4 cm not well-circumscribed hyperkeratotic/lichenified skin with white appearance with a surrounding erythematous border  Anterior chest -there is an erythematous Z-like area over the anterior chest and lateral neck.  She states this is because she has been using lemon juice on that area to help with moles.  There are scattered SKs over the back and anterior chest.  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 Using OTC steroid so will delay bx for derm as I have instructed her to stop all topical steroids.  PLAN : stop all steroid topical creams Serologic rheum W/U Derm referral CXR since dermatomyositis assoc with Pul HTN
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
## 2018-01-18 NOTE — Assessment & Plan Note (Signed)
This problem is chronic and stable.  She states that she is no longer using the [facility].  She uses albuterol as needed and albuterol nebulizer if she gets sick.  She states her breathing has gotten better and she does not get quite as dyspneic on exertion although she still gets it to some degree.  PLAN : refill alb MDI

## 2018-01-18 NOTE — Patient Instructions (Addendum)
1. I will mail you your teste results 2. I am sending you to a dermatologist  3. I am getting a chest Xray today

## 2018-01-18 NOTE — Assessment & Plan Note (Signed)
This problem is chronic and unchanged.  She states she wants to eat all the time.  She was not interested in a nutrition referral as she states that she knows what she needs to do.  Her BMI is 51. Her weight has actually gone down 13 pounds  Since her last appointment in August.  PLAN : Follow

## 2018-01-18 NOTE — Assessment & Plan Note (Signed)
This problem is chronic and stable.  This was diagnosed on sleep study in 2014.  She is on Requip to control her symptoms. Her HgB, MCV, and RDW nl on last check. CBC today and consider ferritin if any abnl. TSH nl 2016 and rechecking today.  PLAN : CBC Cont requip

## 2018-01-18 NOTE — Assessment & Plan Note (Signed)
This problem is chronic and stable.  She continues to see [facility] for mental health services.  They stopped the Wellbutrin but have continued the Zoloft.  Her Xanax was changed to Klonopin and so far she is doing okay.  She did not had a panic attack while her husband drove her to the clinic today that she is worried about the return trip home as she will be on [address] and there will be a lot of traffic.  [place] okay on [place] roads without a lot of traffic.  PLAN : follow Meds per Mental health

## 2018-01-18 NOTE — Assessment & Plan Note (Signed)
This problem is chronic and stable.  She does endorse having GERD symptoms.  She is using ranitidine and he thinks it is 75 mg as needed.  She gets this through her insurance company.  She states they give her a budget monthly and she can buy medications through them, it sounds like mostly over-the-counter medications.  I would rather not accelerate treatment to a PPI unless needed.   PLAN : ranitidine PRN

## 2018-01-18 NOTE — Assessment & Plan Note (Addendum)
This problem is chronic and uncontrolled.  Her sleep study in 2016 showed mild obstructive sleep apnea.  She states she was unable to afford the co-pay for the CPAP.  She described that over the past 2 nights, she would wake up abruptly from a deep sleep gasping and having a dry cough.  She would sit up and she would be okay after about 3 minutes.  She thinks she may have quit breathing as this is what it felt like.  She denies morning headaches.  She feels like she could sleep 24 hours but she does not fall asleep during quiet activities during the day and does not take naps during the day.  Her husband is asleep before she goes to bed so he is not able to say whether or not she has any apnea.  She is able to lie flat with only one pillow without any dyspnea.  ASSESSMENT : The spells could in fact be apnea.  Her sleep study almost 3 years ago with only mild but he can certainly progress over time.  I discussed that we would watch this and if it continues to happen, I would recommend a repeat sleep study.  I doubt she is having pulmonary edema from systolic or diastolic heart failure as she really does not have orthopnea.  Plus, she does not have any peripheral edema.  Her exam does not show pulmonary crackles.  She did have some hypoxia originally at 88% which improved to the mid to high 90s with dedicated breathing. I personally viewed the CXR images and will confirm my reading with the official read.  There is cervical scoliosis but otherwise no significant rotation.  There is slight loss of the left heart border and interstitial markings.  Questionable right artery prominence (offical reading no R artery prominence).  There is obliteration of the retrosternal area on lateral view (offical reading likely 2/2 rotation, obesity).  There is flattening of the diaphragms on both PA and lateral images.  PLAN : F/U official CXR read  Follow nocturnal sxs Check Co2 on BMP - ? [facility]?

## 2018-01-18 NOTE — Assessment & Plan Note (Signed)
See OSA ?

## 2018-01-18 NOTE — Assessment & Plan Note (Signed)
This problem is chronic and stable.  This is an incidental finding on CT in 2017.  She has no symptoms.  We did not discuss primary prevention with a statin today although her 10-year cardiac risk is greater than 20%.  PLAN : discuss statin next appt

## 2018-01-18 NOTE — Assessment & Plan Note (Signed)
She got a Tdap today.  I put in a referral for her mammogram.  She is not interested in the flu vaccine.  She had a colonoscopy in the past and does not want to undergo that again.  She did agree to stool cards that I forgot to give them to her.  We will talk to the lab about mailing her the stool cards.  She had an A1c of 5.5 and 2016.  Her A1c today is 5.5 she did not meet the diagnosis for diabetes nor prediabetes.

## 2018-01-19 ENCOUNTER — Encounter: Payer: Self-pay | Admitting: *Deleted

## 2018-01-19 LAB — CMP14 + ANION GAP
ALBUMIN: 4.2 g/dL (ref 3.6–4.8)
ALK PHOS: 122 IU/L — AB (ref 39–117)
ALT: 11 IU/L (ref 0–32)
AST: 11 IU/L (ref 0–40)
Albumin/Globulin Ratio: 1.6 (ref 1.2–2.2)
Anion Gap: 15 mmol/L (ref 10.0–18.0)
BUN/Creatinine Ratio: 13 (ref 12–28)
BUN: 9 mg/dL (ref 8–27)
Bilirubin Total: 0.3 mg/dL (ref 0.0–1.2)
CO2: 25 mmol/L (ref 20–29)
Calcium: 9.4 mg/dL (ref 8.7–10.3)
Chloride: 101 mmol/L (ref 96–106)
Creatinine, Ser: 0.68 mg/dL (ref 0.57–1.00)
GFR calc non Af Amer: 94 mL/min/{1.73_m2} (ref >59)
GFR, EST AFRICAN AMERICAN: 108 mL/min/{1.73_m2} (ref >59)
GLOBULIN, TOTAL: 2.7 g/dL (ref 1.5–4.5)
Glucose: 108 mg/dL — ABNORMAL HIGH (ref 65–99)
Potassium: 4.2 mmol/L (ref 3.5–5.2)
SODIUM: 141 mmol/L (ref 134–144)
Total Protein: 6.9 g/dL (ref 6.0–8.5)

## 2018-01-19 LAB — CBC WITH DIFFERENTIAL/PLATELET
BASOS: 0 %
Basophils Absolute: 0 10*3/uL (ref 0.0–0.2)
EOS (ABSOLUTE): 0.1 10*3/uL (ref 0.0–0.4)
Eos: 1 %
HEMATOCRIT: 46.3 % (ref 34.0–46.6)
Hemoglobin: 16.1 g/dL — ABNORMAL HIGH (ref 11.1–15.9)
Immature Grans (Abs): 0 10*3/uL (ref 0.0–0.1)
Immature Granulocytes: 0 %
Lymphocytes Absolute: 1.7 10*3/uL (ref 0.7–3.1)
Lymphs: 22 %
MCH: 30 pg (ref 26.6–33.0)
MCHC: 34.8 g/dL (ref 31.5–35.7)
MCV: 86 fL (ref 79–97)
Monocytes Absolute: 0.5 10*3/uL (ref 0.1–0.9)
Monocytes: 6 %
NEUTROS ABS: 5.3 10*3/uL (ref 1.4–7.0)
Neutrophils: 71 %
Platelets: 174 10*3/uL (ref 150–379)
RBC: 5.37 x10E6/uL — ABNORMAL HIGH (ref 3.77–5.28)
RDW: 15.3 % (ref 12.3–15.4)
WBC: 7.6 10*3/uL (ref 3.4–10.8)

## 2018-01-19 LAB — ANTI-SMOOTH MUSCLE ANTIBODY, IGG: Smooth Muscle Ab: 5 Units (ref 0–19)

## 2018-01-19 LAB — SEDIMENTATION RATE: Sed Rate: 42 mm/hr — ABNORMAL HIGH (ref 0–40)

## 2018-01-19 LAB — ANTI-JO 1 ANTIBODY, IGG

## 2018-01-19 LAB — TSH: TSH: 2.91 u[IU]/mL (ref 0.450–4.500)

## 2018-01-19 LAB — RNP ANTIBODIES: ENA RNP Ab: <0.2 AI (ref 0.0–0.9)

## 2018-01-19 LAB — C-REACTIVE PROTEIN: CRP: 26.9 mg/L — ABNORMAL HIGH (ref 0.0–4.9)

## 2018-01-19 LAB — HIV ANTIBODY (ROUTINE TESTING W REFLEX): HIV Screen 4th Generation wRfx: NONREACTIVE

## 2018-01-19 LAB — ANTINUCLEAR ANTIBODIES, IFA: ANA TITER 1: NEGATIVE

## 2018-01-19 NOTE — Progress Notes (Signed)
Per Dr Zenovia Jarred instruction, an IFOBT kit has been mailed to [employer]. A letter of explanation and specimen instructions were included in the package.  Please refer Ms. Marse to the Clinic Lab if she needs additional  Information.  Maryan Rued, PBT Clinic Lab 01-19-18  10:02

## 2018-01-24 ENCOUNTER — Encounter: Payer: Self-pay | Admitting: Internal Medicine

## 2018-02-22 ENCOUNTER — Ambulatory Visit: Payer: Self-pay

## 2018-03-01 ENCOUNTER — Encounter: Payer: Self-pay | Admitting: Internal Medicine

## 2018-03-02 ENCOUNTER — Encounter: Payer: Self-pay | Admitting: Internal Medicine

## 2018-03-08 DIAGNOSIS — F39 Unspecified mood [affective] disorder: Secondary | ICD-10-CM | POA: Diagnosis not present

## 2018-04-05 NOTE — Addendum Note (Signed)
Addended by: Hulan Fray on: 04/05/2018 05:48 PM   Modules accepted: Orders

## 2018-08-13 DIAGNOSIS — F429 Obsessive-compulsive disorder, unspecified: Secondary | ICD-10-CM | POA: Insufficient documentation

## 2018-08-23 ENCOUNTER — Ambulatory Visit: Payer: Self-pay | Admitting: Physician Assistant

## 2018-09-06 ENCOUNTER — Other Ambulatory Visit: Payer: Self-pay | Admitting: Physician Assistant

## 2018-09-06 MED ORDER — TRAZODONE HCL 150 MG PO TABS: 150.0000 mg | ORAL_TABLET | Freq: Every evening | ORAL | 3 refills | Status: DC | PRN

## 2018-09-12 ENCOUNTER — Other Ambulatory Visit: Payer: Self-pay

## 2018-09-12 ENCOUNTER — Ambulatory Visit (HOSPITAL_COMMUNITY)
Admission: RE | Admit: 2018-09-12 | Discharge: 2018-09-12 | Disposition: A | Discharge status: Home / Self Care | Payer: Medicare HMO | Source: Ambulatory Visit | Attending: Internal Medicine | Admitting: Internal Medicine

## 2018-09-12 ENCOUNTER — Ambulatory Visit (INDEPENDENT_AMBULATORY_CARE_PROVIDER_SITE_OTHER): Payer: Medicare HMO | Admitting: Internal Medicine

## 2018-09-12 VITALS — BP 124/72 | HR 80 | Temp 98.3°F | Ht 68.0 in | Wt 314.7 lb

## 2018-09-12 DIAGNOSIS — R059 Cough, unspecified: Secondary | ICD-10-CM

## 2018-09-12 DIAGNOSIS — J069 Acute upper respiratory infection, unspecified: Secondary | ICD-10-CM | POA: Diagnosis not present

## 2018-09-12 DIAGNOSIS — J45909 Unspecified asthma, uncomplicated: Secondary | ICD-10-CM | POA: Diagnosis not present

## 2018-09-12 DIAGNOSIS — R0602 Shortness of breath: Secondary | ICD-10-CM | POA: Diagnosis not present

## 2018-09-12 DIAGNOSIS — R05 Cough: Secondary | ICD-10-CM | POA: Insufficient documentation

## 2018-09-12 MED ORDER — BENZONATATE 100 MG PO CAPS: 100.0000 mg | ORAL_CAPSULE | Freq: Four times a day (QID) | ORAL | 1 refills | Status: DC | PRN

## 2018-09-12 NOTE — Progress Notes (Signed)
   CC: cough  HPI:  [name] [name] is a 62 y.o. with PMH as listed below who presents for cough. Please see the assessment and plans for the status of the patient chronic medical problems.   Past Medical History:  Diagnosis Date  . Anxiety   . Asthma    Chronic, with restrictive component 2/2 obesity  . Depression   . GERD (gastroesophageal reflux disease)   . Headache   . Insomnia   . Obesity   . On home oxygen therapy    "3L at night only when I get sick" (04/30/2014)  . Shortness of breath dyspnea   . Sleep apnea    "couldn't afford mask so I didn't get it" (04/30/2014)   Review of Systems: Refer to history of present illness and assessment and plans for pertinent review of systems, all others reviewed and negative  Physical Exam:  Vitals:   09/12/18 1348  BP: (!) 144/72  Pulse: 93  Temp: 98.3 F (36.8 C)  TempSrc: Oral  SpO2: 92%  Weight: (!) 314 lb 11.2 oz (142.7 kg)  Height: 5\' 8"  (1.727 m)   General: ill appearing  Eyes: sclera are erythematous  HEENT: no palpable cervical lymphadenopathy, rhinorrhea, non productive cough  Pulm: normal work of breathing, no wheezing, lung sounds are distant   Assessment & Plan:   Upper respiratory infection  For the past week and a half she has had cough productive of yellow green mucous, rhinorrhea, cold chills, diffuse bony aches, shortness of breath and wheezing with exertion.  Initially she had a sore throat for about 3 days but this improved with salt water gargles. She denies nausea, vomiting or reduced appetite. She has had numerous sick contacts at church and at the grocery store. She has been using a albuterol nebulizer, this helps with her difficulty breathing and wheezing. She has also been trying tylenol but not tried over the counter cough remedies. She has a history of asthma never been intubated in the past but has been hospitalized for asthma exacerbation.   Symptoms seem consistent with influenza, progression  of her cough is concerning for superimposed pneumonia. Because of the difficult pulmonary exam I ordered a chest xray which did not reveal any infiltrate.  - Trial of conservative therapies for upper respiratory infection  - Rx for tessalon perls  - attempted to call and tell Ms. Waltman about the reassuring CXR but her voicemail was unidentified   See Encounters Tab for problem based charting.  Patient seen with Dr. Lynnae January

## 2018-09-12 NOTE — Patient Instructions (Addendum)
Thank you for coming to the clinic today. It was a pleasure to see you.    You have a viral infection of your sinuses and upper respiratory tract. Unfortunately, we do not have any good antibiotics to treat this infection. However, it should resolve on its own in about 10 days.  In the meantime, there are several good remedies that will treat your symptoms and hopefully help to make you more comfortable. Please look for one of the over-the-counter medications below that will help with your congestion, cough, and post-nasal drip. Additionally, it will be very important to use nasal irrigation and nasal steroid spray to clear you sinus congestion. I have provided a recipe and instructions below.     BUFFERED ISOTONIC SALINE NASAL IRRIGATION  The Benefits:  1. When you irrigate, the isotonic saline (salt water) acts as a solvent and washes the mucus crusts and other debris from your nose.  2. This decongests and improves the airflow into your nose. The sinus passages begin to open.   Over the counter:  I recommend a Squirt Bottle form of saline to provide an adequate volume of irrigation to clear your sinuses.   Home Recipe:  1. Choose a 1-quart glass jar that is thoroughly cleansed.  2. Fill with sterile or distilled water, or you can boil water from the tap.  3. Add 1 to 2 heaping teaspoons of "pickling/canning/sea" salt (NOT table salt as it contains a large number of additives). This salt is available at the grocery store in the food canning section.  4. Mix ingredients together and store at room temperature. Discard after one week. If you find this solution too strong, you may decrease the amount of salt added to 1 to 1  teaspoons. With children it is often best to start with a milder solution and advance slowly. Irrigate with 240 ml (8 oz) twice daily.  The Instructions:  You should plan to irrigate your nose with buffered isotonic saline 2 times per day. Many people  prefer to warm the solution slightly in the microwave - but be sure that the solution is NOT HOT. Stand over the sink (some do this in the shower) and squirt the solution into each side of your nose, keeping your mouth open. This allows you to spit the saltwater out of your mouth. It will not harm you if you swallow a little.  If you have been told to use a nasal steroid such as Flonase, Nasonex, or Nasacort, you should always use isortonic saline solution first, then use your nasal steroid product. The nasal steroid is much more effective when sprayed onto clean nasal membranes and the steroid medicine will reach deeper into the nose.  Most people experience a little burning sensation the first few times they use a isotonic saline solution, but this usually goes away within a few days.   FOLLOW-UP INSTRUCTIONS When: 1 month with Dr. Lynnae January  For: follow up of your general health What to bring: all of your medication bottles   Please call the internal medicine center clinic if you have any questions or concerns, we may be able to help and keep you from a long and expensive emergency room wait. Our clinic and after hours phone number is [phone], the best time to call is Monday through Friday 9 am to 4 pm but there is always someone available 24/7 if you have an emergency. If you need medication refills please notify your pharmacy one week in advance and they  will send [place] a request.     Influenza, Adult Influenza ("the flu") is an infection in the lungs, nose, and throat (respiratory tract). It is caused by a virus. The flu causes many common cold symptoms, as well as a high fever and body aches. It can make you feel very sick. The flu spreads easily from person to person (is contagious). Getting a flu shot (influenza vaccination) every year is the best way to prevent the flu. Follow these instructions at home:  Take over-the-counter and prescription medicines only as told by your  doctor.  Use a cool mist humidifier to add moisture (humidity) to the air in your home. This can make it easier to breathe.  Rest as needed.  Drink enough fluid to keep your pee (urine) clear or pale yellow.  Cover your mouth and nose when you cough or sneeze.  Wash your hands with soap and water often, especially after you cough or sneeze. If you cannot use soap and water, use hand sanitizer.  Stay home from work or school as told by your doctor. Unless you are visiting your doctor, try to avoid leaving home until your fever has been gone for 24 hours without the use of medicine.  Keep all follow-up visits as told by your doctor. This is important. How is this prevented?  Getting a yearly (annual) flu shot is the best way to avoid getting the flu. You may get the flu shot in late summer, fall, or winter. Ask your doctor when you should get your flu shot.  Wash your hands often or use hand sanitizer often.  Avoid contact with people who are sick during cold and flu season.  Eat healthy foods.  Drink plenty of fluids.  Get enough sleep.  Exercise regularly. Contact a doctor if:  You get new symptoms.  You have: ? Chest pain. ? Watery poop (diarrhea). ? A fever.  Your cough gets worse.  You start to have more mucus.  You feel sick to your stomach (nauseous).  You throw up (vomit). Get help right away if:  You start to be short of breath or have trouble breathing.  Your skin or nails turn a bluish color.  You have very bad pain or stiffness in your neck.  You get a sudden headache.  You get sudden pain in your face or ear.  You cannot stop throwing up. This information is not intended to replace advice given to you by your health care provider. Make sure you discuss any questions you have with your health care provider. Document Released: 08/02/2008 Document Revised: 03/31/2016 Document Reviewed: 08/18/2015 Elsevier Interactive Patient Education  2017  [employer].

## 2018-09-13 ENCOUNTER — Encounter: Payer: Self-pay | Admitting: Internal Medicine

## 2018-09-13 NOTE — Assessment & Plan Note (Signed)
For the past week and a half she has had cough productive of yellow green mucous, rhinorrhea, cold chills, diffuse bony aches, shortness of breath and wheezing with exertion.  Initially she had a sore throat for about 3 days but this improved with salt water gargles. She denies nausea, vomiting or reduced appetite. She has had numerous sick contacts at church and at the grocery store. She has been using a albuterol nebulizer, this helps with her difficulty breathing and wheezing. She has also been trying tylenol but not tried over the counter cough remedies. She has a history of asthma never been intubated in the past but has been hospitalized for asthma exacerbation.   Symptoms seem consistent with influenza, progression of her cough is concerning for superimposed pneumonia. Because of the difficult pulmonary exam I ordered a chest xray which did not reveal any infiltrate.  - Trial of conservative therapies for upper respiratory infection  - Rx for tessalon perls  - attempted to call and tell [name] [name] about the reassuring CXR but her voicemail was unidentified

## 2018-09-13 NOTE — Progress Notes (Signed)
Internal Medicine Clinic Attending  I saw and evaluated the patient.  I personally confirmed the key portions of the history and exam documented by Dr. Blum and I reviewed pertinent patient test results.  The assessment, diagnosis, and plan were formulated together and I agree with the documentation in the resident's note. 

## 2018-09-17 ENCOUNTER — Encounter (HOSPITAL_COMMUNITY): Payer: Self-pay | Admitting: Emergency Medicine

## 2018-09-17 ENCOUNTER — Other Ambulatory Visit: Payer: Self-pay

## 2018-09-17 ENCOUNTER — Emergency Department (HOSPITAL_COMMUNITY)
Admission: EM | Admit: 2018-09-17 | Discharge: 2018-09-17 | Disposition: A | Discharge status: Home / Self Care | Payer: Medicare HMO | Attending: Emergency Medicine | Admitting: Emergency Medicine

## 2018-09-17 ENCOUNTER — Emergency Department (HOSPITAL_COMMUNITY): Payer: Medicare HMO

## 2018-09-17 DIAGNOSIS — Z79899 Other long term (current) drug therapy: Secondary | ICD-10-CM | POA: Insufficient documentation

## 2018-09-17 DIAGNOSIS — R0902 Hypoxemia: Secondary | ICD-10-CM | POA: Insufficient documentation

## 2018-09-17 DIAGNOSIS — Z87891 Personal history of nicotine dependence: Secondary | ICD-10-CM | POA: Diagnosis not present

## 2018-09-17 DIAGNOSIS — J441 Chronic obstructive pulmonary disease with (acute) exacerbation: Secondary | ICD-10-CM | POA: Insufficient documentation

## 2018-09-17 DIAGNOSIS — R0602 Shortness of breath: Secondary | ICD-10-CM | POA: Diagnosis present

## 2018-09-17 DIAGNOSIS — R918 Other nonspecific abnormal finding of lung field: Secondary | ICD-10-CM | POA: Diagnosis not present

## 2018-09-17 DIAGNOSIS — Z9981 Dependence on supplemental oxygen: Secondary | ICD-10-CM | POA: Diagnosis not present

## 2018-09-17 DIAGNOSIS — J45909 Unspecified asthma, uncomplicated: Secondary | ICD-10-CM | POA: Insufficient documentation

## 2018-09-17 LAB — COMPREHENSIVE METABOLIC PANEL
ALT: 12 U/L (ref 0–44)
AST: 14 U/L — ABNORMAL LOW (ref 15–41)
Albumin: 3.7 g/dL (ref 3.5–5.0)
Alkaline Phosphatase: 103 U/L (ref 38–126)
Anion gap: 7 (ref 5–15)
BUN: 7 mg/dL — AB (ref 8–23)
CHLORIDE: 105 mmol/L (ref 98–111)
CO2: 27 mmol/L (ref 22–32)
CREATININE: 0.59 mg/dL (ref 0.44–1.00)
Calcium: 8.8 mg/dL — ABNORMAL LOW (ref 8.9–10.3)
GFR calc Af Amer: >60 mL/min (ref >60)
GFR calc non Af Amer: >60 mL/min (ref >60)
Glucose, Bld: 113 mg/dL — ABNORMAL HIGH (ref 70–99)
Potassium: 3.4 mmol/L — ABNORMAL LOW (ref 3.5–5.1)
SODIUM: 139 mmol/L (ref 135–145)
Total Bilirubin: 0.7 mg/dL (ref 0.3–1.2)
Total Protein: 7.1 g/dL (ref 6.5–8.1)

## 2018-09-17 LAB — BLOOD GAS, VENOUS
Acid-Base Excess: 3.7 mmol/L — ABNORMAL HIGH (ref 0.0–2.0)
Bicarbonate: 27.1 mmol/L (ref 20.0–28.0)
DRAWN BY: 277331
FIO2: 0.21
O2 SAT: 94.4 %
PATIENT TEMPERATURE: 37
pCO2, Ven: 46.9 mmHg (ref 44.0–60.0)
pH, Ven: 7.398 (ref 7.250–7.430)
pO2, Ven: 70.7 mmHg — ABNORMAL HIGH (ref 32.0–45.0)

## 2018-09-17 LAB — CBC WITH DIFFERENTIAL/PLATELET
ABS IMMATURE GRANULOCYTES: 0.03 10*3/uL (ref 0.00–0.07)
BASOS ABS: 0 10*3/uL (ref 0.0–0.1)
Basophils Relative: 0 %
EOS PCT: 1 %
Eosinophils Absolute: 0.1 10*3/uL (ref 0.0–0.5)
HEMATOCRIT: 46.7 % — AB (ref 36.0–46.0)
HEMOGLOBIN: 14.9 g/dL (ref 12.0–15.0)
IMMATURE GRANULOCYTES: 0 %
LYMPHS ABS: 1.6 10*3/uL (ref 0.7–4.0)
LYMPHS PCT: 17 %
MCH: 28.4 pg (ref 26.0–34.0)
MCHC: 31.9 g/dL (ref 30.0–36.0)
MCV: 89.1 fL (ref 80.0–100.0)
Monocytes Absolute: 0.7 10*3/uL (ref 0.1–1.0)
Monocytes Relative: 8 %
Neutro Abs: 6.9 10*3/uL (ref 1.7–7.7)
Neutrophils Relative %: 74 %
Platelets: 177 10*3/uL (ref 150–400)
RBC: 5.24 MIL/uL — ABNORMAL HIGH (ref 3.87–5.11)
RDW: 14.9 % (ref 11.5–15.5)
WBC: 9.4 10*3/uL (ref 4.0–10.5)
nRBC: 0 % (ref 0.0–0.2)

## 2018-09-17 LAB — BRAIN NATRIURETIC PEPTIDE: B Natriuretic Peptide: 61 pg/mL (ref 0.0–100.0)

## 2018-09-17 LAB — TROPONIN I: Troponin I: <0.03 ng/mL (ref <0.03)

## 2018-09-17 LAB — D-DIMER, QUANTITATIVE (NOT AT ARMC): D DIMER QUANT: 0.5 ug{FEU}/mL (ref 0.00–0.50)

## 2018-09-17 MED ORDER — AMOXICILLIN-POT CLAVULANATE 875-125 MG PO TABS: 1.0000 | ORAL_TABLET | Freq: Two times a day (BID) | ORAL | 0 refills | Status: DC

## 2018-09-17 MED ORDER — AMOXICILLIN-POT CLAVULANATE 875-125 MG PO TABS
1.0000 | ORAL_TABLET | Freq: Once | ORAL | Status: AC
  Administered 2018-09-17: 1 via ORAL
  Filled 2018-09-17: qty 1

## 2018-09-17 MED ORDER — PREDNISONE 20 MG PO TABS: 20.0000 mg | ORAL_TABLET | Freq: Two times a day (BID) | ORAL | 0 refills | Status: DC

## 2018-09-17 MED ORDER — PREDNISONE 50 MG PO TABS
60.0000 mg | ORAL_TABLET | Freq: Once | ORAL | Status: AC
  Administered 2018-09-17: 60 mg via ORAL
  Filled 2018-09-17: qty 1

## 2018-09-17 NOTE — ED Triage Notes (Signed)
Pt c/o SOB for a week. Was seen by PCP for viral upper respiratory infection. Green productive cough.

## 2018-09-17 NOTE — Discharge Instructions (Signed)
We are prescribing Augmentin and prednisone to help treat infection and inflammation.  You appear to have some chronic lung disease and probably some acute bronchitis.  There may be an early pneumonia as well.  We are sending prescriptions for antibiotic and prednisone to your pharmacy.  It is important to get plenty of rest and drinking a lot of fluids.  Use your nebulizer or inhaler as needed for cough or trouble breathing.  Follow-up with your doctor as soon as possible.  We are going to have home health come to your home to assist her breathing and possibly help to initiate oxygen therapy if needed.

## 2018-09-17 NOTE — ED Notes (Signed)
Pt removed monitor wires. Saying she is ready to go.

## 2018-09-17 NOTE — ED Provider Notes (Signed)
[facility] EMERGENCY DEPARTMENT Provider Note   CSN: [id number] Arrival date & time: 09/17/18  1521     History   Chief Complaint Chief Complaint  Patient presents with  . Shortness of Breath    HPI [name] [name] is a 62 y.o. female.  HPI   She presents for evaluation of shortness of breath with cough productive of green-yellow sputum.  She feels like she is wheezing and only getting slight relief from her albuterol inhaler.  She saw her PCP 1 week ago and was advised to use OTC medications for "virus" and possibly influenza.  No documented fever.  No nausea, vomiting, chest pain, weakness or dizziness.  There are no other known modifying factors.  Past Medical History:  Diagnosis Date  . Anxiety   . Asthma    Chronic, with restrictive component 2/2 obesity  . Depression   . GERD (gastroesophageal reflux disease)   . Headache   . Insomnia   . Obesity   . On home oxygen therapy    "3L at night only when I get sick" (04/30/2014)  . Shortness of breath dyspnea   . Sleep apnea    "couldn't afford mask so I didn't get it" (04/30/2014)    Patient Active Problem List   Diagnosis Date Noted  . Upper respiratory infection 09/13/2018  . OCD (obsessive compulsive disorder) 08/13/2018  . Aortic atherosclerosis ([place]) 08/17/2016  . Rash and nonspecific skin eruption 12/17/2015  . Periodic limb movement disorder 08/14/2015  . Chronic respiratory failure ([place]) 01/21/2015  . Obesity hypoventilation syndrome ([place]) 01/21/2015  . Health care maintenance 07/01/2014  . Asthma, chronic 05/28/2013  . OSA (obstructive sleep apnea) 04/04/2013  . Anxiety disorder 07/02/2012  . Depression 06/28/2012  . Morbid obesity ([place]) 03/02/2012  . GERD (gastroesophageal reflux disease) 02/05/2012    Past Surgical History:  Procedure Laterality Date  . APPENDECTOMY  ~ 2002  . VAGINAL HYSTERECTOMY  ~ 1978     OB History   None      Home Medications    Prior to Admission medications    Medication Sig Start Date End Date Taking? Authorizing Provider  acetaminophen (TYLENOL) 500 MG tablet Take 1,000 mg by mouth every 6 (six) hours as needed for moderate pain (back pain).   Yes [provider]  albuterol (PROVENTIL HFA;VENTOLIN HFA) 108 (90 Base) MCG/ACT inhaler Inhale 2 puffs into the lungs every 6 (six) hours as needed for wheezing or shortness of breath (wheezing). 01/18/18  Yes Bartholomew Crews, MD  albuterol (PROVENTIL) (5 MG/ML) 0.5% nebulizer solution Take 2.5 mg by nebulization every 6 (six) hours as needed for wheezing or shortness of breath (wheezing).    Yes [provider]  ALPRAZolam Duanne Moron) 1 MG tablet Take 0.5 mg by mouth 2 (two) times daily as needed for anxiety.   Yes [provider]  benzonatate (TESSALON PERLES) 100 MG capsule Take 1 capsule (100 mg total) by mouth every 6 (six) hours as needed for cough. 09/12/18 09/12/19 Yes Ledell Noss, MD  nystatin (MYCOSTATIN/NYSTOP) 100000 UNIT/GM POWD Please apply to skin under breasts twice daily until rash resolves. Patient taking differently: Apply topically 2 (two) times daily as needed. Please apply to skin under breasts twice daily until rash resolves. 12/17/15  Yes Corky Sox, MD  ranitidine (ZANTAC) 75 MG tablet Take 75 mg by mouth 3 (three) times daily as needed for heartburn.    Yes [provider]  rOPINIRole (REQUIP) 0.25 MG tablet Take 1  tablet (0.25 mg total) by mouth at bedtime. 01/18/18  Yes Bartholomew Crews, MD  sertraline (ZOLOFT) 100 MG tablet Take 100 mg by mouth 2 (two) times daily.   Yes [provider]  traZODone (DESYREL) 150 MG tablet Take 1 tablet (150 mg total) by mouth at bedtime as needed for sleep. 09/06/18  Yes Donnal Moat T, PA-C  amoxicillin-clavulanate (AUGMENTIN) 875-125 MG tablet Take 1 tablet by mouth 2 (two) times daily. One po bid x 7 days 09/17/18   Daleen Bo, MD  predniSONE (DELTASONE) 20 MG tablet Take 1 tablet (20 mg total) by  mouth 2 (two) times daily. 09/17/18   Daleen Bo, MD    Family History Family History  Problem Relation Age of Onset  . Breast cancer Mother   . Alcohol abuse Mother   . Emphysema Mother        smoked  . Asthma Mother   . Breast cancer Maternal Grandmother   . Breast cancer Maternal Aunt     Social History Social History   Tobacco Use  . Smoking status: Former Smoker    Packs/day: 0.00    Years: 44.00    Pack years: 0.00    Last attempt to quit: 11/07/2012    Years since quitting: 5.8  . Smokeless tobacco: Never Used  Substance Use Topics  . Alcohol use: No    Alcohol/week: 0.0 standard drinks  . Drug use: No     Allergies   Ibuprofen   Review of Systems Review of Systems  All other systems reviewed and are negative.    Physical Exam Updated Vital Signs BP 118/66   Pulse 76   Temp 98.1 F (36.7 C) (Oral)   Resp (!) 25   Ht 5\' 8"  (1.727 m)   Wt (!) 142.4 kg   SpO2 92%   BMI 47.74 kg/m   Physical Exam  Constitutional: She is oriented to person, place, and time. She appears well-developed. She does not appear ill.  Overweight  HENT:  Head: Normocephalic and atraumatic.  Eyes: Pupils are equal, round, and reactive to light. Conjunctivae and EOM are normal.  Neck: Normal range of motion and phonation normal. Neck supple.  Cardiovascular: Normal rate and regular rhythm.  Pulmonary/Chest: Effort normal and breath sounds normal. No stridor. No respiratory distress. She has no wheezes. She exhibits no tenderness.  Decreased airflow bilaterally with scattered rhonchi and wheezes.  There is no increased work of breathing.  Abdominal: Soft. She exhibits no distension. There is no tenderness. There is no guarding.  Musculoskeletal: Normal range of motion. She exhibits no edema, tenderness or deformity.  No asymmetry of the lower legs.  Neurological: She is alert and oriented to person, place, and time. She exhibits normal muscle tone.  Skin: Skin is warm and  dry.  Psychiatric: She has a normal mood and affect. Her behavior is normal. Judgment and thought content normal.  Nursing note and vitals reviewed.    ED Treatments / Results  Labs (all labs ordered are listed, but only abnormal results are displayed) Labs Reviewed  COMPREHENSIVE METABOLIC PANEL - Abnormal; Notable for the following components:      Result Value   Potassium 3.4 (*)    Glucose, Bld 113 (*)    BUN 7 (*)    Calcium 8.8 (*)    AST 14 (*)    All other components within normal limits  CBC WITH DIFFERENTIAL/PLATELET - Abnormal; Notable for the following components:   RBC 5.24 (*)  HCT 46.7 (*)    All other components within normal limits  BLOOD GAS, VENOUS - Abnormal; Notable for the following components:   pO2, Ven 70.7 (*)    Acid-Base Excess 3.7 (*)    All other components within normal limits  BRAIN NATRIURETIC PEPTIDE  D-DIMER, QUANTITATIVE (NOT AT [facility])  TROPONIN I    EKG EKG Interpretation  Date/Time:  Monday September 17 2018 15:36:18 EST Ventricular Rate:  84 PR Interval:    QRS Duration: 111 QT Interval:  411 QTC Calculation: 486 R Axis:   -41 Text Interpretation:  Sinus rhythm Left axis deviation Abnormal R-wave progression, late transition Borderline T abnormalities, anterior leads Borderline prolonged QT interval since last tracing no significant change Confirmed by Daleen Bo [phone]) on 09/17/2018 4:11:24 PM   Radiology Dg Chest 2 View  Result Date: 09/17/2018 CLINICAL DATA:  62 year old female with a history of shortness of breath EXAM: CHEST - 2 VIEW COMPARISON:  09/12/2018 FINDINGS: Cardiomediastinal silhouette unchanged in size and contour. No evidence of central vascular congestion. Reticular pattern of opacity in the left greater than right lower lungs. No pneumothorax. No pleural effusion. Similar appearance of scoliotic curvature of the spine. IMPRESSION: Reticular opacity of the lower lungs, more pronounced than the comparison,  may reflect new bronchitis or atypical infection. Electronically Signed   By: Corrie Mckusick D.O.   On: 09/17/2018 16:18    Procedures Procedures (including critical care time)  Medications Ordered in ED Medications  amoxicillin-clavulanate (AUGMENTIN) 875-125 MG per tablet 1 tablet (1 tablet Oral Given 09/17/18 1929)  predniSONE (DELTASONE) tablet 60 mg (60 mg Oral Given 09/17/18 1929)     Initial Impression / Assessment and Plan / ED Course  I have reviewed the triage vital signs and the nursing notes.  Pertinent labs & imaging results that were available during my care of the patient were reviewed by me and considered in my medical decision making (see chart for details).  Clinical Course as of Sep 17 1946  [facility] Sep 17, 2018  6606 Normal  Brain natriuretic peptide [EW]  1938 Normal except hematocrit elevated  CBC with Differential(!) [EW]  1938 Normal  Troponin I - Once [EW]  1938 Normal venous gas  Blood gas, venous(!) [EW]  1938 Normal except potassium low, glucose high, BUN low, calcium low  Comprehensive metabolic panel(!) [EW]  3016 Normal  D-dimer, quantitative [EW]  [place] is placed patient on oxygen because her sats dropped to 88% by report.  This occurred while she was on room air.  States she monitors her oxygen saturations at home and they fluctuate between 70 and 94.  She feels like she has more trouble with her oxygen when her "hair gets wet."  She has nebulizers and inhalers at home to use but does not use them because she does not like the jittery feeling that occurs when she takes them.  She does not smoke cigarettes.   [EW]    Clinical Course User Index [EW] Daleen Bo, MD     Patient Vitals for the past 24 hrs:  BP Temp Temp src Pulse Resp SpO2 Height Weight  09/17/18 1730 118/66 - - 76 (!) 25 92 % - -  09/17/18 1700 115/85 - - 74 (!) 25 92 % - -  09/17/18 1527 (!) 135/104 98.1 F (36.7 C) Oral 90 (!) 28 92 % 5\' 8"  (1.727 m) (!) 142.4 kg     7:40 PM Reevaluation with update and discussion. After initial assessment and  treatment, an updated evaluation reveals she is comfortable and wishes to go home.  She states she has been on oxygen in the past but "sent it back 2 years ago.". Daleen Bo   Medical Decision Making: Evaluation consistent with bronchitis.  Possible early pneumonia.  Borderline low oxygen saturation, previously present inpatient.  Think she has an element of chronic obstructive pulmonary disease.  Doubt serious bacterial infection or impending vascular collapse.  CRITICAL CARE-no Performed by: Daleen Bo   Nursing Notes Reviewed/ Care Coordinated Applicable Imaging Reviewed Interpretation of Laboratory Data incorporated into ED treatment  The patient appears reasonably screened and/or stabilized for discharge and I doubt any other medical condition or other [facility] requiring further screening, evaluation, or treatment in the ED at this time prior to discharge.  Plan: Home Medications-continue current medications, use inhalers and nebulizers as needed; Home Treatments-rest; return here if the recommended treatment, does not improve the symptoms; Recommended follow up-PCP follow-up as soon as possible.  Health assessment by nursing to document hypoxia if present.  Home health services ordered.    Final Clinical Impressions(s) / ED Diagnoses   Final diagnoses:  COPD exacerbation [facility])  Hypoxia    ED Discharge Orders         [place]     09/17/18 1945    Face-to-face encounter (required for Medicare/Medicaid patients)    Comments:  I Daleen Bo certify that this patient is under my care and that I, or a nurse practitioner or physician's assistant working with me, had a face-to-face encounter that meets the physician face-to-face encounter requirements with this patient on 09/17/2018. The encounter with the patient was in whole, or in part for the following medical condition(s) which is the  primary reason for home health care (List medical condition): Bronchitis, dyspnea   09/17/18 1945    amoxicillin-clavulanate (AUGMENTIN) 875-125 MG tablet  2 times daily     09/17/18 1946    predniSONE (DELTASONE) 20 MG tablet  2 times daily     09/17/18 1946           Daleen Bo, MD 09/17/18 1947

## 2018-09-20 ENCOUNTER — Encounter: Payer: Self-pay | Admitting: Internal Medicine

## 2018-09-20 ENCOUNTER — Emergency Department (HOSPITAL_COMMUNITY): Payer: Medicare HMO

## 2018-09-20 ENCOUNTER — Other Ambulatory Visit: Payer: Self-pay

## 2018-09-20 ENCOUNTER — Inpatient Hospital Stay (HOSPITAL_COMMUNITY)
Admission: EM | Admit: 2018-09-20 | Discharge: 2018-09-24 | DRG: 202 | Disposition: A | Discharge status: Home / Self Care | Payer: Medicare HMO | Attending: Internal Medicine | Admitting: Internal Medicine

## 2018-09-20 ENCOUNTER — Telehealth: Payer: Self-pay | Admitting: *Deleted

## 2018-09-20 ENCOUNTER — Encounter (HOSPITAL_COMMUNITY): Payer: Self-pay | Admitting: Emergency Medicine

## 2018-09-20 DIAGNOSIS — R0902 Hypoxemia: Secondary | ICD-10-CM | POA: Diagnosis present

## 2018-09-20 DIAGNOSIS — G4733 Obstructive sleep apnea (adult) (pediatric): Secondary | ICD-10-CM | POA: Diagnosis present

## 2018-09-20 DIAGNOSIS — S82399S Other fracture of lower end of unspecified tibia, sequela: Secondary | ICD-10-CM | POA: Diagnosis not present

## 2018-09-20 DIAGNOSIS — G4700 Insomnia, unspecified: Secondary | ICD-10-CM | POA: Diagnosis present

## 2018-09-20 DIAGNOSIS — N649 Disorder of breast, unspecified: Secondary | ICD-10-CM | POA: Diagnosis present

## 2018-09-20 DIAGNOSIS — J45909 Unspecified asthma, uncomplicated: Secondary | ICD-10-CM | POA: Diagnosis not present

## 2018-09-20 DIAGNOSIS — J9621 Acute and chronic respiratory failure with hypoxia: Secondary | ICD-10-CM | POA: Diagnosis not present

## 2018-09-20 DIAGNOSIS — Z6841 Body Mass Index (BMI) 40.0 and over, adult: Secondary | ICD-10-CM | POA: Diagnosis not present

## 2018-09-20 DIAGNOSIS — Z79899 Other long term (current) drug therapy: Secondary | ICD-10-CM | POA: Diagnosis not present

## 2018-09-20 DIAGNOSIS — F329 Major depressive disorder, single episode, unspecified: Secondary | ICD-10-CM | POA: Diagnosis present

## 2018-09-20 DIAGNOSIS — Z90710 Acquired absence of both cervix and uterus: Secondary | ICD-10-CM | POA: Diagnosis not present

## 2018-09-20 DIAGNOSIS — K219 Gastro-esophageal reflux disease without esophagitis: Secondary | ICD-10-CM | POA: Diagnosis present

## 2018-09-20 DIAGNOSIS — N6341 Unspecified lump in right breast, subareolar: Secondary | ICD-10-CM | POA: Diagnosis not present

## 2018-09-20 DIAGNOSIS — F428 Other obsessive-compulsive disorder: Secondary | ICD-10-CM | POA: Diagnosis present

## 2018-09-20 DIAGNOSIS — Z87891 Personal history of nicotine dependence: Secondary | ICD-10-CM | POA: Diagnosis not present

## 2018-09-20 DIAGNOSIS — J449 Chronic obstructive pulmonary disease, unspecified: Secondary | ICD-10-CM | POA: Diagnosis present

## 2018-09-20 DIAGNOSIS — J45901 Unspecified asthma with (acute) exacerbation: Secondary | ICD-10-CM | POA: Diagnosis present

## 2018-09-20 DIAGNOSIS — R0602 Shortness of breath: Secondary | ICD-10-CM | POA: Diagnosis not present

## 2018-09-20 DIAGNOSIS — F419 Anxiety disorder, unspecified: Secondary | ICD-10-CM | POA: Diagnosis present

## 2018-09-20 DIAGNOSIS — J440 Chronic obstructive pulmonary disease with acute lower respiratory infection: Secondary | ICD-10-CM | POA: Diagnosis not present

## 2018-09-20 DIAGNOSIS — I700 Atherosclerosis of aorta: Secondary | ICD-10-CM | POA: Diagnosis present

## 2018-09-20 DIAGNOSIS — Z825 Family history of asthma and other chronic lower respiratory diseases: Secondary | ICD-10-CM

## 2018-09-20 DIAGNOSIS — Z9981 Dependence on supplemental oxygen: Secondary | ICD-10-CM

## 2018-09-20 DIAGNOSIS — J9611 Chronic respiratory failure with hypoxia: Secondary | ICD-10-CM | POA: Diagnosis not present

## 2018-09-20 DIAGNOSIS — Z886 Allergy status to analgesic agent status: Secondary | ICD-10-CM

## 2018-09-20 DIAGNOSIS — K0889 Other specified disorders of teeth and supporting structures: Secondary | ICD-10-CM | POA: Diagnosis not present

## 2018-09-20 DIAGNOSIS — J208 Acute bronchitis due to other specified organisms: Secondary | ICD-10-CM | POA: Diagnosis not present

## 2018-09-20 DIAGNOSIS — Z79891 Long term (current) use of opiate analgesic: Secondary | ICD-10-CM | POA: Diagnosis not present

## 2018-09-20 DIAGNOSIS — J441 Chronic obstructive pulmonary disease with (acute) exacerbation: Secondary | ICD-10-CM | POA: Diagnosis present

## 2018-09-20 DIAGNOSIS — R0603 Acute respiratory distress: Secondary | ICD-10-CM | POA: Diagnosis not present

## 2018-09-20 DIAGNOSIS — J209 Acute bronchitis, unspecified: Secondary | ICD-10-CM | POA: Diagnosis not present

## 2018-09-20 LAB — CBC WITH DIFFERENTIAL/PLATELET
Abs Immature Granulocytes: 0.05 10*3/uL (ref 0.00–0.07)
BASOS PCT: 0 %
Basophils Absolute: 0 10*3/uL (ref 0.0–0.1)
EOS PCT: 0 %
Eosinophils Absolute: 0 10*3/uL (ref 0.0–0.5)
HCT: 47.1 % — ABNORMAL HIGH (ref 36.0–46.0)
Hemoglobin: 14.9 g/dL (ref 12.0–15.0)
Immature Granulocytes: 1 %
Lymphocytes Relative: 22 %
Lymphs Abs: 2 10*3/uL (ref 0.7–4.0)
MCH: 28.8 pg (ref 26.0–34.0)
MCHC: 31.6 g/dL (ref 30.0–36.0)
MCV: 91.1 fL (ref 80.0–100.0)
MONO ABS: 0.7 10*3/uL (ref 0.1–1.0)
Monocytes Relative: 8 %
Neutro Abs: 6.2 10*3/uL (ref 1.7–7.7)
Neutrophils Relative %: 69 %
PLATELETS: 172 10*3/uL (ref 150–400)
RBC: 5.17 MIL/uL — AB (ref 3.87–5.11)
RDW: 14.6 % (ref 11.5–15.5)
WBC: 9 10*3/uL (ref 4.0–10.5)
nRBC: 0 % (ref 0.0–0.2)

## 2018-09-20 LAB — I-STAT TROPONIN, ED: TROPONIN I, POC: 0 ng/mL (ref 0.00–0.08)

## 2018-09-20 LAB — BRAIN NATRIURETIC PEPTIDE: B NATRIURETIC PEPTIDE 5: 97.8 pg/mL (ref 0.0–100.0)

## 2018-09-20 LAB — BASIC METABOLIC PANEL
Anion gap: 10 (ref 5–15)
BUN: 8 mg/dL (ref 8–23)
CALCIUM: 8.9 mg/dL (ref 8.9–10.3)
CO2: 28 mmol/L (ref 22–32)
CREATININE: 0.69 mg/dL (ref 0.44–1.00)
Chloride: 101 mmol/L (ref 98–111)
Glucose, Bld: 100 mg/dL — ABNORMAL HIGH (ref 70–99)
Potassium: 3.4 mmol/L — ABNORMAL LOW (ref 3.5–5.1)
SODIUM: 139 mmol/L (ref 135–145)

## 2018-09-20 LAB — PROCALCITONIN: Procalcitonin: <0.1 ng/mL

## 2018-09-20 MED ORDER — ACETAMINOPHEN 325 MG PO TABS: 650.0000 mg | ORAL_TABLET | Freq: Four times a day (QID) | ORAL | Status: DC | PRN

## 2018-09-20 MED ORDER — IPRATROPIUM-ALBUTEROL 0.5-2.5 (3) MG/3ML IN SOLN
3.0000 mL | Freq: Four times a day (QID) | RESPIRATORY_TRACT | Status: DC | PRN
  Administered 2018-09-23: 3 mL via RESPIRATORY_TRACT
  Filled 2018-09-20 (×2): qty 3

## 2018-09-20 MED ORDER — TRAZODONE HCL 150 MG PO TABS
150.0000 mg | ORAL_TABLET | Freq: Every evening | ORAL | Status: DC | PRN
  Administered 2018-09-21 – 2018-09-22 (×2): 150 mg via ORAL
  Filled 2018-09-20 (×2): qty 1

## 2018-09-20 MED ORDER — METHYLPREDNISOLONE SODIUM SUCC 125 MG IJ SOLR
125.0000 mg | Freq: Once | INTRAMUSCULAR | Status: AC
  Administered 2018-09-20: 125 mg via INTRAVENOUS
  Filled 2018-09-20: qty 2

## 2018-09-20 MED ORDER — ENOXAPARIN SODIUM 80 MG/0.8ML ~~LOC~~ SOLN
70.0000 mg | SUBCUTANEOUS | Status: DC
  Administered 2018-09-20 – 2018-09-23 (×4): 70 mg via SUBCUTANEOUS
  Filled 2018-09-20 (×4): qty 0.8

## 2018-09-20 MED ORDER — GUAIFENESIN-DM 100-10 MG/5ML PO SYRP
5.0000 mL | ORAL_SOLUTION | ORAL | Status: DC | PRN
  Administered 2018-09-21 – 2018-09-23 (×4): 5 mL via ORAL
  Filled 2018-09-20 (×5): qty 5

## 2018-09-20 MED ORDER — SENNOSIDES-DOCUSATE SODIUM 8.6-50 MG PO TABS: 1.0000 | ORAL_TABLET | Freq: Every evening | ORAL | Status: DC | PRN

## 2018-09-20 MED ORDER — BENZONATATE 100 MG PO CAPS
100.0000 mg | ORAL_CAPSULE | Freq: Four times a day (QID) | ORAL | Status: DC | PRN
  Administered 2018-09-21 – 2018-09-23 (×6): 100 mg via ORAL
  Filled 2018-09-20 (×6): qty 1

## 2018-09-20 MED ORDER — SERTRALINE HCL 100 MG PO TABS
100.0000 mg | ORAL_TABLET | Freq: Two times a day (BID) | ORAL | Status: DC
  Administered 2018-09-20 – 2018-09-24 (×8): 100 mg via ORAL
  Filled 2018-09-20 (×8): qty 1

## 2018-09-20 MED ORDER — ALPRAZOLAM 0.5 MG PO TABS
0.5000 mg | ORAL_TABLET | Freq: Two times a day (BID) | ORAL | Status: DC | PRN
  Administered 2018-09-21 – 2018-09-23 (×3): 0.5 mg via ORAL
  Filled 2018-09-20 (×3): qty 1

## 2018-09-20 MED ORDER — ROPINIROLE HCL 0.5 MG PO TABS
0.2500 mg | ORAL_TABLET | Freq: Every day | ORAL | Status: DC
  Administered 2018-09-20 – 2018-09-23 (×4): 0.25 mg via ORAL
  Filled 2018-09-20 (×5): qty 1

## 2018-09-20 MED ORDER — ALBUTEROL SULFATE (2.5 MG/3ML) 0.083% IN NEBU: 2.5000 mg | INHALATION_SOLUTION | Freq: Four times a day (QID) | RESPIRATORY_TRACT | Status: DC | PRN

## 2018-09-20 MED ORDER — FAMOTIDINE 10 MG PO TABS
10.0000 mg | ORAL_TABLET | Freq: Every day | ORAL | Status: DC
  Administered 2018-09-20 – 2018-09-24 (×5): 10 mg via ORAL
  Filled 2018-09-20 (×5): qty 1

## 2018-09-20 MED ORDER — IPRATROPIUM-ALBUTEROL 0.5-2.5 (3) MG/3ML IN SOLN
3.0000 mL | Freq: Once | RESPIRATORY_TRACT | Status: AC
  Administered 2018-09-20: 3 mL via RESPIRATORY_TRACT
  Filled 2018-09-20: qty 3

## 2018-09-20 MED ORDER — POTASSIUM CHLORIDE CRYS ER 20 MEQ PO TBCR
40.0000 meq | EXTENDED_RELEASE_TABLET | Freq: Once | ORAL | Status: AC
  Administered 2018-09-20: 40 meq via ORAL
  Filled 2018-09-20: qty 2

## 2018-09-20 MED ORDER — ACETAMINOPHEN 650 MG RE SUPP: 650.0000 mg | Freq: Four times a day (QID) | RECTAL | Status: DC | PRN

## 2018-09-20 NOTE — ED Notes (Signed)
Bedside commode placed in room. 

## 2018-09-20 NOTE — ED Triage Notes (Signed)
Pt with sob x1 week. She denies chest pain. States she is being sent from Dr. Phylliss Bob (internal med) for her symptoms. She is currently on a course of amoxicillin.

## 2018-09-20 NOTE — ED Provider Notes (Signed)
[facility] EMERGENCY DEPARTMENT Provider Note   CSN: [id number] Arrival date & time: 09/20/18  1317     History   Chief Complaint Chief Complaint  Patient presents with  . Shortness of Breath    HPI [name] [name] is a 62 y.o. female.  HPI Patient reports she has had several weeks of cough.  She reports it has been productive particularly in the mornings of thick yellow-green sputum.  She has not had any documented fever.  No chest pain.  She reports she has however felt short of breath.  She was seen by her PCP initially and symptoms reportedly were more suggestive of bronchitis.  Patient took the [employer]  She reports that she continues to get worse.  She reports that she contacted her doctor again and she has had some low oxygen readings.  She reports that that was something new for her.  She reports that she had been on supplemental oxygen in the more distant past but had not needed it recently.  Patient feels pretty strongly that this is not bronchitis.  No lower extremity swelling or calf pain.  She reports that she was referred to the emergency department by her PCP. Past Medical History:  Diagnosis Date  . Anxiety   . Asthma    Chronic, with restrictive component 2/2 obesity  . Depression   . GERD (gastroesophageal reflux disease)   . Headache   . Insomnia   . Obesity   . On home oxygen therapy    "3L at night only when I get sick" (04/30/2014)  . Shortness of breath dyspnea   . Sleep apnea    "couldn't afford mask so I didn't get it" (04/30/2014)    Patient Active Problem List   Diagnosis Date Noted  . Upper respiratory infection 09/13/2018  . OCD (obsessive compulsive disorder) 08/13/2018  . Aortic atherosclerosis ([place]) 08/17/2016  . Rash and nonspecific skin eruption 12/17/2015  . Periodic limb movement disorder 08/14/2015  . Chronic respiratory failure ([place]) 01/21/2015  . Obesity hypoventilation syndrome ([place]) 01/21/2015  . Health  care maintenance 07/01/2014  . Asthma, chronic 05/28/2013  . OSA (obstructive sleep apnea) 04/04/2013  . Anxiety disorder 07/02/2012  . Depression 06/28/2012  . Morbid obesity ([place]) 03/02/2012  . GERD (gastroesophageal reflux disease) 02/05/2012    Past Surgical History:  Procedure Laterality Date  . APPENDECTOMY  ~ 2002  . VAGINAL HYSTERECTOMY  ~ 1978     OB History   None      Home Medications    Prior to Admission medications   Medication Sig Start Date End Date Taking? Authorizing Provider  acetaminophen (TYLENOL) 500 MG tablet Take 1,000 mg by mouth every 6 (six) hours as needed (for pain OR back pain).    Yes [provider]  albuterol (PROVENTIL HFA;VENTOLIN HFA) 108 (90 Base) MCG/ACT inhaler Inhale 2 puffs into the lungs every 6 (six) hours as needed for wheezing or shortness of breath (wheezing). 01/18/18  Yes Bartholomew Crews, MD  ALPRAZolam Duanne Moron) 1 MG tablet Take 0.5 mg by mouth 2 (two) times daily as needed for anxiety.   Yes [provider]  amoxicillin-clavulanate (AUGMENTIN) 875-125 MG tablet Take 1 tablet by mouth 2 (two) times daily. One po bid x 7 days Patient taking differently: Take 1 tablet by mouth See admin instructions. Take 1 tablet by mouth two times a day for 7 days, starting on 09/17/18 09/17/18  Yes Daleen Bo, MD  benzonatate [facility]  PERLES) 100 MG capsule Take 1 capsule (100 mg total) by mouth every 6 (six) hours as needed for cough. 09/12/18 09/12/19 Yes Ledell Noss, MD  predniSONE (DELTASONE) 20 MG tablet Take 1 tablet (20 mg total) by mouth 2 (two) times daily. Patient taking differently: Take 20 mg by mouth See admin instructions. Take 20 mg by mouth two times a day for 5 days beginning on 09/17/2018 09/17/18  Yes Daleen Bo, MD  ranitidine (ZANTAC) 75 MG tablet Take 75 mg by mouth 3 (three) times daily as needed for heartburn.    Yes [provider]  rOPINIRole (REQUIP) 0.25 MG tablet Take 1 tablet (0.25 mg  total) by mouth at bedtime. Patient taking differently: Take 0.25 mg by mouth at bedtime as needed (for restless legs).  01/18/18  Yes Bartholomew Crews, MD  sertraline (ZOLOFT) 100 MG tablet Take 100 mg by mouth 2 (two) times daily.   Yes [provider]  traZODone (DESYREL) 150 MG tablet Take 1 tablet (150 mg total) by mouth at bedtime as needed for sleep. Patient taking differently: Take 150 mg by mouth at bedtime.  09/06/18  Yes Hurst, Teresa T, PA-C  albuterol (PROVENTIL) (5 MG/ML) 0.5% nebulizer solution Take 2.5 mg by nebulization every 6 (six) hours as needed for wheezing or shortness of breath (wheezing).     [provider]  nystatin (MYCOSTATIN/NYSTOP) 100000 UNIT/GM POWD Please apply to skin under breasts twice daily until rash resolves. Patient not taking: Reported on 09/20/2018 12/17/15   Corky Sox, MD    Family History Family History  Problem Relation Age of Onset  . Breast cancer Mother   . Alcohol abuse Mother   . Emphysema Mother        smoked  . Asthma Mother   . Breast cancer Maternal Grandmother   . Breast cancer Maternal Aunt     Social History Social History   Tobacco Use  . Smoking status: Former Smoker    Packs/day: 0.00    Years: 44.00    Pack years: 0.00    Last attempt to quit: 11/07/2012    Years since quitting: 5.8  . Smokeless tobacco: Never Used  Substance Use Topics  . Alcohol use: No    Alcohol/week: 0.0 standard drinks  . Drug use: No     Allergies   Ibuprofen   Review of Systems Review of Systems 10 Systems reviewed and are negative for acute change except as noted in the HPI.   Physical Exam Updated Vital Signs BP 130/83   Pulse 69   Temp 97.7 F (36.5 C)   Resp (!) 23   SpO2 93%   Physical Exam  Constitutional: She is oriented to person, place, and time.  Patient is alert and nontoxic.  Mild increased work of breathing at rest.  She is sitting up in a chair.  Morbid obesity.  HENT:  Head:  Atraumatic.  Mouth/Throat: Oropharynx is clear and moist.  Eyes: EOM are normal.  Neck: Neck supple.  Cardiovascular: Normal rate, regular rhythm, normal heart sounds and intact distal pulses.  Pulmonary/Chest: Effort normal.  Breath sounds are soft to the bases.  No gross wheeze rhonchi or rale.  Abdominal: Soft. She exhibits no distension. There is no tenderness.  Musculoskeletal: Normal range of motion. She exhibits no edema or tenderness.  Neurological: She is alert and oriented to person, place, and time. She exhibits normal muscle tone. Coordination normal.  Skin: Skin is warm and dry.  Psychiatric: She  has a normal mood and affect.     ED Treatments / Results  Labs (all labs ordered are listed, but only abnormal results are displayed) Labs Reviewed  BASIC METABOLIC PANEL - Abnormal; Notable for the following components:      Result Value   Potassium 3.4 (*)    Glucose, Bld 100 (*)    All other components within normal limits  CBC WITH DIFFERENTIAL/PLATELET - Abnormal; Notable for the following components:   RBC 5.17 (*)    HCT 47.1 (*)    All other components within normal limits  BRAIN NATRIURETIC PEPTIDE  URINALYSIS, ROUTINE W REFLEX MICROSCOPIC  I-STAT TROPONIN, ED    EKG Won't import from MUSE. No change from previous  Radiology Dg Chest [facility] 1 View  Result Date: 09/20/2018 CLINICAL DATA:  Acute shortness of breath and fever. EXAM: PORTABLE CHEST 1 VIEW COMPARISON:  09/17/2018 and prior studies FINDINGS: The cardiomediastinal silhouette is unremarkable. Mild interstitial opacities again noted. There is no evidence of focal airspace disease, pulmonary edema, suspicious pulmonary nodule/mass, pleural effusion, or pneumothorax. No acute bony abnormalities are identified. IMPRESSION: No active disease. Electronically Signed   By: Margarette Canada M.D.   On: 09/20/2018 15:15    Procedures Procedures (including critical care time) No critical care. Medications Ordered in  ED Medications  ipratropium-albuterol (DUONEB) 0.5-2.5 (3) MG/3ML nebulizer solution 3 mL (3 mLs Nebulization Given 09/20/18 1544)  methylPREDNISolone sodium succinate (SOLU-MEDROL) 125 mg/2 mL injection 125 mg (125 mg Intravenous Given 09/20/18 1544)     Initial Impression / Assessment and Plan / ED Course  I have reviewed the triage vital signs and the nursing notes.  Pertinent labs & imaging results that were available during my care of the patient were reviewed by me and considered in my medical decision making (see chart for details).  Clinical Course as of Sep 21 1623  Thu Sep 20, 2018  1505 Consult to internal medicine teaching service ordered.   [MP]    Clinical Course User Index [MP] Charlesetta Shanks, MD   Patient has had an indolent course of cough and chest congestion for about 2 weeks.  She has now become somewhat hypoxic.  Her mental status is clear.  She is not exhibiting respiratory distress.  She does describe thick mucus, no fever.  Patient does not feel that symptoms are due to bronchitis but by history and exam this does still seem quite suggestive of bronchitis\COPD.  Will administer DuoNeb and Solu-Medrol.  Plan to admit to internal medicine teaching service.  Final Clinical Impressions(s) / ED Diagnoses   Final diagnoses:  Hypoxia  COPD exacerbation [facility]    ED Discharge Orders    None       Charlesetta Shanks, MD 09/20/18 1627

## 2018-09-20 NOTE — Telephone Encounter (Signed)
Agree with eval by physician

## 2018-09-20 NOTE — Telephone Encounter (Signed)
Spoke w/ dr Heber Lanark and ED chg nurse, made them aware of pt stating SATS down into 70's

## 2018-09-20 NOTE — Progress Notes (Signed)
Patient refuses CPAP. Patient is on [facility]

## 2018-09-20 NOTE — ED Notes (Signed)
Portable xray at bedside.

## 2018-09-20 NOTE — Telephone Encounter (Signed)
Per kayeG. And dr Software engineer, called pt encouraged her to come ASAP to [facility] ED, she states she is embarrassed about the diarrhea and wants to wait a few hours to see if it eases off.her SATS have dipped into 70's recently and pt states she just does not feel "right" Triage encourages that she not wait due to her worsening resp status. She insists that she will be at [facility] by 1300.informed pt nurse will be calling back to check status. Will call [phone] pager. Spoke w/ dr Software engineer, will keep her updated.

## 2018-09-20 NOTE — ED Notes (Signed)
Admitting MD at bedside.

## 2018-09-20 NOTE — ED Notes (Signed)
IV team at bedside 

## 2018-09-20 NOTE — H&P (Signed)
Date: 09/20/2018               Patient Name:  [name] [name] MRN: [id number]  DOB: October 08, 1956 Age / Sex: 62 y.o., female   PCP: Bartholomew Crews, MD         Medical Service: Internal Medicine Teaching Service         Attending Physician: Dr. Rebeca Alert Raynaldo Opitz, MD    First Contact: Dr. Koleen Distance  Pager: [phone]  Second Contact: Dr. Tarri Abernethy Pager: [phone]       After Hours (After 5p/  First Contact Pager: [phone]  weekends / holidays): Second Contact Pager: [phone]   Chief Complaint: shortness of breath   History of Present Illness: [name] [name] is a 62 y/o female with PMHx of COPD, OSA not on CPAP, GERD, anxiety and depression who presents for 3 weeks of shortness of breath that has progressively worsened. Her symptoms began with sore throat and chills that resolved. She then developed a productive cough with white sputum initially that has now become yellow-green in color. She has been using her albuterol inhaler with minimal relief. Endorses significant dyspnea on exertion and has not been able to sleep well at night due to coughing. She also complains of intermittent chest pain with coughing, generalized malaise and fatigue. She presented to [facility] on 11/6. Symptoms felt to be consistent with URI secondary to possible influenza. CXR was done to rule out PNA which was negative. She was given a prescription for Tessalon perls. 5 days later on 11/11 she presented to [facility] ED for continued shortness of breath. She was prescribed Augmentin and Prednisone for bronchitis. She has not had any improvement in her symptoms, but developed diarrhea as a side effect from Abx that began yesterday.  She has been have home saturations into the 80s at home. Patient made [facility] aware of this and was told to go to the ED for further evaluation.  Patient denies syncope, headache, fevers, chills, abdominal pain, n/v, urinary symptoms.   Meds:  Current Meds  Medication Sig  . acetaminophen  (TYLENOL) 500 MG tablet Take 1,000 mg by mouth every 6 (six) hours as needed (for pain OR back pain).   Marland Kitchen albuterol (PROVENTIL HFA;VENTOLIN HFA) 108 (90 Base) MCG/ACT inhaler Inhale 2 puffs into the lungs every 6 (six) hours as needed for wheezing or shortness of breath (wheezing).  . ALPRAZolam (XANAX) 1 MG tablet Take 0.5 mg by mouth 2 (two) times daily as needed for anxiety.  Marland Kitchen amoxicillin-clavulanate (AUGMENTIN) 875-125 MG tablet Take 1 tablet by mouth 2 (two) times daily. One po bid x 7 days (Patient taking differently: Take 1 tablet by mouth See admin instructions. Take 1 tablet by mouth two times a day for 7 days, starting on 09/17/18)  . benzonatate (TESSALON PERLES) 100 MG capsule Take 1 capsule (100 mg total) by mouth every 6 (six) hours as needed for cough.  . predniSONE (DELTASONE) 20 MG tablet Take 1 tablet (20 mg total) by mouth 2 (two) times daily. (Patient taking differently: Take 20 mg by mouth See admin instructions. Take 20 mg by mouth two times a day for 5 days beginning on 09/17/2018)  . ranitidine (ZANTAC) 75 MG tablet Take 75 mg by mouth 3 (three) times daily as needed for heartburn.   Marland Kitchen rOPINIRole (REQUIP) 0.25 MG tablet Take 1 tablet (0.25 mg total) by mouth at bedtime. (Patient taking differently: Take 0.25 mg by mouth at bedtime as needed (for restless legs). )  .  sertraline (ZOLOFT) 100 MG tablet Take 100 mg by mouth 2 (two) times daily.  . traZODone (DESYREL) 150 MG tablet Take 1 tablet (150 mg total) by mouth at bedtime as needed for sleep. (Patient taking differently: Take 150 mg by mouth at bedtime. )     Allergies: Allergies as of 09/20/2018 - Review Complete 09/20/2018  Allergen Reaction Noted  . Ibuprofen Anaphylaxis, Hives, and Swelling 02/01/2012   Past Medical History:  Diagnosis Date  . Anxiety   . Asthma    Chronic, with restrictive component 2/2 obesity  . Depression   . GERD (gastroesophageal reflux disease)   . Headache   . Insomnia   . Obesity     . On home oxygen therapy    "3L at night only when I get sick" (04/30/2014)  . Shortness of breath dyspnea   . Sleep apnea    "couldn't afford mask so I didn't get it" (04/30/2014)    Family History:  Family History  Problem Relation Age of Onset  . Breast cancer Mother   . Alcohol abuse Mother   . Emphysema Mother        smoked  . Asthma Mother   . Breast cancer Maternal Grandmother   . Breast cancer Maternal Aunt      Social History: lives in [place] with her husband; previously smoked 2.5 pks per day for approximately 47 years. Quit 3 years ago. No EtOH use  Review of Systems: A complete ROS was negative except as per HPI.  Physical Exam: Blood pressure (P) 139/84, pulse (P) 81, temperature (P) 97.9 F (36.6 C), temperature source (P) Oral, resp. rate (P) 18, SpO2 94 %. General: awake, alert, morbidly obese female sitting up in her chair in NAD HEENT: [place]/AT; poor dentition; mild erythema in posterior oropharynx  Neck: supple; no thyromegaly  CV: RRR; no murmurs rubs or gallops Pulm: mildly increased work of breathing; lung sounds distant with scattered wheezing  Abd: BS+; abdomen is soft, non-tender, non-distended Ext: no edema Skin: warm, dry; no rashes  Neuro: A&Ox4; no focal deficits  Psych: appropriate mood and affect   CXR: personally reviewed my interpretation is no bone abnormalities; costophrenic angles visible bilaterally; no evidence of effusion or focal consolidation  Assessment & Plan by Problem: Active Problems:   Shortness of breath  1. Shortness of breath: Patient presents with 3 weeks of productive cough and shortness of breath. She is afebrile without leukocytosis. CXR without any acute process. She likely has underlying COPD that has acutely worsened due to viral bronchitis. She previously required home oxygen, but hasn't been on any for 2 years. Currently saturating on 3L nasal canula. Appears fairly comfortable.  Do not suspect cardiac etiology  given that she appears euvolemic on exam with negative troponins.  - she has received solumedrol in the ED; will re-evaluate in the morning and likely transition back to PO steroids -  Continue duonebs q 6 prn - Robitussin DM and Tessalon perls for cough - RVP, procalcitonin pending - she is not on any maintenance medications at home; will likely need to start on discharge - will have her ambulate with pulse ox; suspect she will need home O2    2. GERD: continue Pepcid 10 mg   3. Anxiety and Depression:  - continue Sertraline, Trazodone  - Xanax prn   DVT ppx: Lovenox  Diet: Regular CODE: FULL  Dispo: Admit patient to Observation with expected length of stay less than 2 midnights.  Signed: Modena Nunnery  D, DO 09/20/2018, 6:14 PM  Pager: [phone]

## 2018-09-21 DIAGNOSIS — J208 Acute bronchitis due to other specified organisms: Secondary | ICD-10-CM | POA: Diagnosis not present

## 2018-09-21 DIAGNOSIS — K219 Gastro-esophageal reflux disease without esophagitis: Secondary | ICD-10-CM | POA: Diagnosis present

## 2018-09-21 DIAGNOSIS — R0902 Hypoxemia: Secondary | ICD-10-CM | POA: Diagnosis present

## 2018-09-21 DIAGNOSIS — K0889 Other specified disorders of teeth and supporting structures: Secondary | ICD-10-CM

## 2018-09-21 DIAGNOSIS — G4733 Obstructive sleep apnea (adult) (pediatric): Secondary | ICD-10-CM

## 2018-09-21 DIAGNOSIS — Z87891 Personal history of nicotine dependence: Secondary | ICD-10-CM

## 2018-09-21 DIAGNOSIS — Z6841 Body Mass Index (BMI) 40.0 and over, adult: Secondary | ICD-10-CM | POA: Diagnosis not present

## 2018-09-21 DIAGNOSIS — J441 Chronic obstructive pulmonary disease with (acute) exacerbation: Secondary | ICD-10-CM | POA: Diagnosis present

## 2018-09-21 DIAGNOSIS — Z90710 Acquired absence of both cervix and uterus: Secondary | ICD-10-CM | POA: Diagnosis not present

## 2018-09-21 DIAGNOSIS — Z79899 Other long term (current) drug therapy: Secondary | ICD-10-CM | POA: Diagnosis not present

## 2018-09-21 DIAGNOSIS — J9621 Acute and chronic respiratory failure with hypoxia: Secondary | ICD-10-CM | POA: Diagnosis not present

## 2018-09-21 DIAGNOSIS — Z825 Family history of asthma and other chronic lower respiratory diseases: Secondary | ICD-10-CM | POA: Diagnosis not present

## 2018-09-21 DIAGNOSIS — I700 Atherosclerosis of aorta: Secondary | ICD-10-CM | POA: Diagnosis present

## 2018-09-21 DIAGNOSIS — F329 Major depressive disorder, single episode, unspecified: Secondary | ICD-10-CM | POA: Diagnosis present

## 2018-09-21 DIAGNOSIS — F428 Other obsessive-compulsive disorder: Secondary | ICD-10-CM | POA: Diagnosis present

## 2018-09-21 DIAGNOSIS — J440 Chronic obstructive pulmonary disease with acute lower respiratory infection: Secondary | ICD-10-CM | POA: Diagnosis not present

## 2018-09-21 DIAGNOSIS — N649 Disorder of breast, unspecified: Secondary | ICD-10-CM | POA: Diagnosis present

## 2018-09-21 DIAGNOSIS — N6341 Unspecified lump in right breast, subareolar: Secondary | ICD-10-CM | POA: Diagnosis not present

## 2018-09-21 DIAGNOSIS — J45901 Unspecified asthma with (acute) exacerbation: Secondary | ICD-10-CM | POA: Diagnosis present

## 2018-09-21 DIAGNOSIS — Z9981 Dependence on supplemental oxygen: Secondary | ICD-10-CM | POA: Diagnosis not present

## 2018-09-21 DIAGNOSIS — F419 Anxiety disorder, unspecified: Secondary | ICD-10-CM | POA: Diagnosis present

## 2018-09-21 DIAGNOSIS — G4700 Insomnia, unspecified: Secondary | ICD-10-CM | POA: Diagnosis present

## 2018-09-21 DIAGNOSIS — Z886 Allergy status to analgesic agent status: Secondary | ICD-10-CM

## 2018-09-21 DIAGNOSIS — J209 Acute bronchitis, unspecified: Secondary | ICD-10-CM | POA: Diagnosis not present

## 2018-09-21 DIAGNOSIS — R0603 Acute respiratory distress: Secondary | ICD-10-CM | POA: Diagnosis not present

## 2018-09-21 DIAGNOSIS — Z79891 Long term (current) use of opiate analgesic: Secondary | ICD-10-CM | POA: Diagnosis not present

## 2018-09-21 LAB — RESPIRATORY PANEL BY PCR
Adenovirus: NOT DETECTED
BORDETELLA PERTUSSIS-RVPCR: NOT DETECTED
CHLAMYDOPHILA PNEUMONIAE-RVPPCR: NOT DETECTED
Coronavirus 229E: NOT DETECTED
Coronavirus HKU1: NOT DETECTED
Coronavirus NL63: NOT DETECTED
Coronavirus OC43: NOT DETECTED
INFLUENZA A-RVPPCR: NOT DETECTED
Influenza B: NOT DETECTED
MYCOPLASMA PNEUMONIAE-RVPPCR: NOT DETECTED
Metapneumovirus: NOT DETECTED
PARAINFLUENZA VIRUS 3-RVPPCR: NOT DETECTED
PARAINFLUENZA VIRUS 4-RVPPCR: NOT DETECTED
Parainfluenza Virus 1: NOT DETECTED
Parainfluenza Virus 2: NOT DETECTED
RHINOVIRUS / ENTEROVIRUS - RVPPCR: NOT DETECTED
Respiratory Syncytial Virus: NOT DETECTED

## 2018-09-21 LAB — URINALYSIS, ROUTINE W REFLEX MICROSCOPIC
Bilirubin Urine: NEGATIVE
GLUCOSE, UA: NEGATIVE mg/dL
Hgb urine dipstick: NEGATIVE
Ketones, ur: NEGATIVE mg/dL
LEUKOCYTES UA: NEGATIVE
NITRITE: NEGATIVE
PROTEIN: NEGATIVE mg/dL
Specific Gravity, Urine: 1.01 (ref 1.005–1.030)
pH: 7 (ref 5.0–8.0)

## 2018-09-21 MED ORDER — PREDNISONE 20 MG PO TABS
40.0000 mg | ORAL_TABLET | Freq: Every day | ORAL | Status: DC
  Administered 2018-09-21 – 2018-09-24 (×4): 40 mg via ORAL
  Filled 2018-09-21 (×4): qty 2

## 2018-09-21 MED ORDER — ALBUTEROL SULFATE (2.5 MG/3ML) 0.083% IN NEBU
2.5000 mg | INHALATION_SOLUTION | Freq: Three times a day (TID) | RESPIRATORY_TRACT | Status: DC
  Administered 2018-09-22 – 2018-09-23 (×4): 2.5 mg via RESPIRATORY_TRACT
  Filled 2018-09-21 (×4): qty 3

## 2018-09-21 MED ORDER — ALBUTEROL SULFATE (2.5 MG/3ML) 0.083% IN NEBU
2.5000 mg | INHALATION_SOLUTION | RESPIRATORY_TRACT | Status: DC
  Administered 2018-09-21: 2.5 mg via RESPIRATORY_TRACT
  Filled 2018-09-21: qty 3

## 2018-09-21 NOTE — Progress Notes (Signed)
  Date: 09/21/2018  Patient name: [name] [name]  Medical record number: [id number]  Date of birth: [birth year]   I have seen and evaluated this patient and I have discussed the plan of care with the house staff. Please see their note for complete details. I concur with their findings with the following additions/corrections:  Please see my separate attestation on the H&P from 09/20/2018  Lenice Pressman, M.D., Ph.D. 09/21/2018, 3:52 PM

## 2018-09-21 NOTE — Progress Notes (Signed)
RN removed patients O2 while sitting patient O2 sat is 92% off oxygen.

## 2018-09-21 NOTE — Care Management Note (Signed)
Case Management Note  Patient Details  Name: [name] [name] MRN: [id number] Date of Birth: 1956/06/01  Subjective/Objective:  62yo female presented with SOB.                  Action/Plan: CM met with patient to discuss transitional needs. Patient lives at home with spouse, independent with ADLs, with no DME in use PTA. PCP verified as: Dr. Larey Dresser; pharmacy of choice: [facility], [place]. Patient verbalized needing a nebulizer/home O2; patient reports she was previously on home O2, but hasn't required O2 in the last 2 years. DME preference given, with no preference per patient. Nebulizer referral given to [facility] liaison; AVS updated. CM discussed POC with Dr. Koleen Distance, who will continue to monitor and evaluate the need for home O2. Patient indicated her spouse would provide transportation home. CM team will continue to follow.  Expected Discharge Date:  09/21/18               Expected Discharge Plan:  Home/Self Care  In-House Referral:  NA  Discharge planning Services  CM Consult  Post Acute Care Choice:  Durable Medical Equipment Choice offered to:  Patient  DME Arranged:  Nebulizer machine, Oxygen DME Agency:  [place] Arranged:  NA [place] Agency:  NA  Status of Service:  In process, will continue to follow  If discussed at Long Length of Stay Meetings, dates discussed:    Additional Comments:  Midge Minium RN, BSN, NCM-BC, ACM-RN [phone] 09/21/2018, 11:41 AM

## 2018-09-21 NOTE — Progress Notes (Signed)
Upon assessment, noted large lump under right breast with redness and soreness around it. Paged resident on call and made aware

## 2018-09-21 NOTE — Progress Notes (Signed)
Transferred from [facility] to 6n13 at this time.

## 2018-09-21 NOTE — Progress Notes (Signed)
Pt refusing CPAP stating she does not use at home either. I told her to call if she changes her mind.

## 2018-09-21 NOTE — Progress Notes (Signed)
   Subjective: Ms. Colquhoun was seen and evaluated at bedside on morning rounds. No acute events overnight. She endorses some improvement in her symptoms. Still becomes dyspneic with conversing. Cough improving with Tessalon perls and Robitussin. Denies fevers, chills, chest pain, abdominal pain. Discussed plan for continued breathing treatments and try to wean off oxygen supplementation. All questions and concerns were addressed.   Objective:  Vital signs in last 24 hours: Vitals:   09/20/18 1811 09/20/18 2321 09/21/18 0554 09/21/18 1142  BP: 139/84 112/63 111/66   Pulse: 81 73 66   Resp: 18 18 16    Temp: 97.9 F (36.6 C) 97.8 F (36.6 C) 97.8 F (36.6 C)   TempSrc: Oral Oral Oral   SpO2: 92% 91% 95% 95%   General: awake, alert, morbidly obese female CV: RRR; no murmurs, rubs or gallops Pulm: able to speak in full sentences but becomes dyspneic with conversation. Diminished lung sounds throughout. No wheezes Ext: no edema    Assessment/Plan:  Principal Problem:   Acute exacerbation of COPD with asthma (HCC) Active Problems:   Acute on chronic respiratory failure with hypoxia (HCC)   Shortness of breath  1. Acute exacerbation of COPD with asthma - likely due to viral URI - RVP negative; procalcitonin negative  - PFTs from 2014 reviewed showing obstructive pattern with moderate improvement post bronchodilator. Given significant smoking history, likely has concurrent COPD. CT chest from 2 years ago demonstrate blebs in superior lung fields which is also consistent with COPD.  - continue Prednisone - Robitussin, Tessalon perls for cough - scheduled duonebs q 4 while awake - will start her on ICS for maintenance prior to discharge    Dispo: Anticipated discharge in approximately 1-2 day(s).   Modena Nunnery D, DO 09/21/2018, 3:59 PM Pager: [phone]

## 2018-09-22 NOTE — Progress Notes (Signed)
RT has refused CPAP HS tonight. Patient states she does not wear one at home.

## 2018-09-22 NOTE — Plan of Care (Signed)
°  Problem: Clinical Measurements: °Goal: Ability to maintain clinical measurements within normal limits will improve °Outcome: Progressing °Goal: Will remain free from infection °Outcome: Progressing °Goal: Diagnostic test results will improve °Outcome: Progressing °Goal: Respiratory complications will improve °Outcome: Progressing °  °

## 2018-09-22 NOTE — Progress Notes (Signed)
   Subjective: Patient is doing well this AM. She is off oxygen at rest but continues to feel dyspneic with minimal ambulation and with conversation. She does get relief from her inhalers. She also raises concerns about right breast pain. States that she noticed erythema and a bump several weeks ago. She is unsure if it drained anything but thinks that it may have. She has not had a mammogram in >2years. We discussed plan for continued respiratory support in the hospital today and outpatient follow-up of her right breast lesion. She voices understanding. All questions and concerns addressed.   Objective: Vital signs in last 24 hours: Vitals:   09/22/18 0547 09/22/18 0818 09/22/18 0853 09/22/18 1017  BP: (!) 150/89 129/72 129/72 133/75  Pulse: 72 70 70 78  Resp: 16   17  Temp: 98.4 F (36.9 C) 97.8 F (36.6 C) 97.8 F (36.6 C) 98.3 F (36.8 C)  TempSrc: Oral Oral Oral Oral  SpO2: 90% 90% 90% 92%  Weight:      Height:       General: Obese female in no acute distress Pulm: Good air movement with expiratory wheezing CV: RRR, no murmurs, no rubs  Abdomen: Soft, non-distended, no tenderness to palpation  Right Breast: Erythematous nodule to the right of the areola on the right breast, no fluctuating mass, no diffuse erythema, no drainage from the nipple  Assessment/Plan:  [name] [name] is a 62 y.o female with concurrent COPD/Asthma who presented with progressive dyspnea and URI symptoms. She was subsequently admitted for an acute asthma/COPD exacerbation in the setting of viral bronchitis.   1. Acute exacerbation of COPD with asthma - 2/2 viral bronchitis  - Continue Prednisone - Robitussin, Tessalon perls for cough - Scheduled duonebs q 4 while awake. Start maintenance therapy on discharge.  - Ambulatory pulse ox today, likely discharge tomorrow  Dispo: Anticipated discharge in approximately 1-2 day(s) pending improvement in respiratory status.   Ina Homes, MD 09/22/2018,  10:33 AM

## 2018-09-22 NOTE — Progress Notes (Signed)
Medicine attending: Clinical status and medical database reviewed in detail with resident physician Dr. Ina Homes and I concur with his evaluation and management plan.  62 year old woman admitted on November 14 with acute on chronic hypoxic respiratory failure.  Likely combined asthma and obstructive airway disease.  Probably precipitated by a viral bronchitis.  No fever.  Borderline elevation of white count at 9400 with 74% neutrophils and 17% lymphocytes.  She is being treated symptomatically with oxygen, bronchodilators, and a short course of steroids.  She is improving and we anticipate discharge within the next 24-48 hours.

## 2018-09-23 DIAGNOSIS — R0603 Acute respiratory distress: Secondary | ICD-10-CM

## 2018-09-23 DIAGNOSIS — J440 Chronic obstructive pulmonary disease with acute lower respiratory infection: Secondary | ICD-10-CM

## 2018-09-23 DIAGNOSIS — J208 Acute bronchitis due to other specified organisms: Secondary | ICD-10-CM

## 2018-09-23 DIAGNOSIS — N6341 Unspecified lump in right breast, subareolar: Secondary | ICD-10-CM

## 2018-09-23 MED ORDER — ALBUTEROL SULFATE (2.5 MG/3ML) 0.083% IN NEBU
2.5000 mg | INHALATION_SOLUTION | Freq: Four times a day (QID) | RESPIRATORY_TRACT | Status: DC | PRN
  Filled 2018-09-23: qty 3

## 2018-09-23 MED ORDER — MOMETASONE FURO-FORMOTEROL FUM 100-5 MCG/ACT IN AERO
2.0000 | INHALATION_SPRAY | Freq: Two times a day (BID) | RESPIRATORY_TRACT | Status: DC
  Administered 2018-09-23 – 2018-09-24 (×3): 2 via RESPIRATORY_TRACT
  Filled 2018-09-23: qty 8.8

## 2018-09-23 NOTE — Progress Notes (Addendum)
Called to pt's room for SOB. Pt O2 dropped to 83%. Pt able to talk. O2 returned to 93% on RA. Lungs clear to auscultation. MD team at bedside for rounding. Pt teary with MD.

## 2018-09-23 NOTE — Progress Notes (Signed)
Medicine attending: I examined this patient today and I attest to the accuracy of the evaluation and management plan as recorded by resident physician Dr. Ina Homes.  The patient was improving but this morning at time of rounds she developed an episode of acute respiratory distress where she felt that she could not either breathe in or out.  She became quite anxious and apologetic.  She had a second episode while we were observing her.  No wheezing.  Good air movement over the lungs on exam.  Suspect transient laryngospasm.  No history of prior DVT or PE.  She is already on a course of prednisone.  We will begin [facility]. We will continue observation over the course of the day.  If condition stabilizes we may be able to send her home today or early tomorrow.

## 2018-09-23 NOTE — Progress Notes (Signed)
Patient has refused CPAP HS tonight.

## 2018-09-23 NOTE — Progress Notes (Signed)
   Subjective: Patient experienced an episode of acute shortness of breath. She was not doing anything at the time except eating breakfast and watching TV. She subsequently felt that she could not breath. This is the second time over the past couple weeks that this has occurred. She is tearful and apologizing. These episodes do not last long but are frightening. She does not have a history of VTE/PE. Discussed that we will reassess this afternoon. It is still possible that she can be discharged today but will depend on her respiratory status this afternoon.   Objective: Vital signs in last 24 hours: Vitals:   09/22/18 2023 09/22/18 2119 09/23/18 0455 09/23/18 0810  BP:  124/65 125/74   Pulse:  77 76 89  Resp:  18 20 18   Temp:  98 F (36.7 C) 98.4 F (36.9 C)   TempSrc:  Oral Oral   SpO2: 92% 93% 91% 93%  Weight:      Height:       General: Obese female in no acute distress Pulm: Good air movement with no wheezing or crackles  CV: RRR, no murmurs, no rubs  Abdomen: Soft, non-distended, no tenderness to palpation   Assessment/Plan:  [name] [name] is a 62 y.o female with concurrent COPD/Asthma who presented with progressive dyspnea and URI symptoms. She was subsequently admitted for an acute asthma/COPD exacerbation in the setting of viral bronchitis.   Acute Dyspnea  - Episode of unprovoked dyspnea, self resolved  - PE without wheezing or crackles  - Possible laryngeal spasm   Acute exacerbation of COPD with asthma - 2/2 viral bronchitis  - Continue Prednisone - Robitussin, Tessalon perls for cough - Scheduled duonebs q 4 while awake.  - Ambulatory pulse ox today, if maintains oxygen sats will discharge today  - Start Dulera 100-5 two puffs BID. Although ICS have been shown to increase COPD exacerbations, in the select patient population with concurrent Asthma/COPD they are beneficial. Therefore, Dulera (ICS + LABA) should be a good maintenance medication for this patient.    Right Breast Lesion  - Erythematous nodule to the right of the areola on the right breast - Appears to have drained in the past  - No diffuse erythema or peu'd orange appearance to suggest inflammatory breast cancer. Likely an epidermoid cyst; however, will need to be followed up as outpatient. Ultrasound may be of help for diagnostic purposes.  - Outpatient mammography for screening   Dispo: Anticipated discharge in approximately 0-1 day(s).   Ina Homes, MD 09/23/2018, 10:54 AM

## 2018-09-24 ENCOUNTER — Telehealth: Payer: Self-pay

## 2018-09-24 DIAGNOSIS — J209 Acute bronchitis, unspecified: Secondary | ICD-10-CM

## 2018-09-24 MED ORDER — MOMETASONE FURO-FORMOTEROL FUM 100-5 MCG/ACT IN AERO: 2.0000 | INHALATION_SPRAY | Freq: Two times a day (BID) | RESPIRATORY_TRACT | 1 refills | Status: DC

## 2018-09-24 MED ORDER — ALBUTEROL SULFATE (5 MG/ML) 0.5% IN NEBU: 2.5000 mg | INHALATION_SOLUTION | Freq: Four times a day (QID) | RESPIRATORY_TRACT | 12 refills | Status: DC | PRN

## 2018-09-24 MED ORDER — PREDNISONE 10 MG PO TABS: ORAL_TABLET | ORAL | 0 refills | Status: AC

## 2018-09-24 MED ORDER — ALBUTEROL SULFATE HFA 108 (90 BASE) MCG/ACT IN AERS: 2.0000 | INHALATION_SPRAY | Freq: Four times a day (QID) | RESPIRATORY_TRACT | 2 refills | Status: DC | PRN

## 2018-09-24 MED ORDER — GUAIFENESIN-DM 100-10 MG/5ML PO SYRP: 5.0000 mL | ORAL_SOLUTION | ORAL | 0 refills | Status: DC | PRN

## 2018-09-24 NOTE — Progress Notes (Signed)
Patient discharged to home. Verbalizes understanding of all discharge instructions including discharge medications and follow up MD visits. Patient accompanied by spouse.

## 2018-09-24 NOTE — Care Management Important Message (Signed)
Important Message  Patient Details  Name: [name] [name] MRN: [id number] Date of Birth: 1956-06-12   Medicare Important Message Given:  Yes    Eyan Hagood [address] 09/24/2018, 4:06 PM

## 2018-09-24 NOTE — Telephone Encounter (Signed)
Hospital TOC per Dr Tarri Abernethy, discharge 09/24/2018, appt 10/01/2018.

## 2018-09-24 NOTE — Discharge Summary (Signed)
Name: [name] [name] MRN: [id number] DOB: 07-Mar-1956 62 y.o. PCP: Bartholomew Crews, MD  Date of Admission: 09/20/2018  1:18 PM Date of Discharge: 09/24/2018 Attending Physician: Vear Clock, MD Discharge Diagnosis: 1. Acute exacerbation of COPD with asthma 2. Right breast lesion   Discharge Medications: Allergies as of 09/24/2018      Reactions   Ibuprofen Anaphylaxis, Hives, Swelling   Throat swells      Medication List    STOP taking these medications   amoxicillin-clavulanate 875-125 MG tablet Commonly known as:  AUGMENTIN     TAKE these medications   acetaminophen 500 MG tablet Commonly known as:  TYLENOL Take 1,000 mg by mouth every 6 (six) hours as needed (for pain OR back pain).   albuterol (5 MG/ML) 0.5% nebulizer solution Commonly known as:  PROVENTIL Take 0.5 mLs (2.5 mg total) by nebulization every 6 (six) hours as needed for wheezing or shortness of breath. What changed:  reasons to take this   albuterol 108 (90 Base) MCG/ACT inhaler Commonly known as:  PROVENTIL HFA;VENTOLIN HFA Inhale 2 puffs into the lungs every 6 (six) hours as needed for wheezing or shortness of breath. What changed:  reasons to take this   ALPRAZolam 1 MG tablet Commonly known as:  XANAX Take 0.5 mg by mouth 2 (two) times daily as needed for anxiety.   benzonatate 100 MG capsule Commonly known as:  TESSALON Take 1 capsule (100 mg total) by mouth every 6 (six) hours as needed for cough.   guaiFENesin-dextromethorphan 100-10 MG/5ML syrup Commonly known as:  ROBITUSSIN DM Take 5 mLs by mouth every 4 (four) hours as needed for cough.   mometasone-formoterol 100-5 MCG/ACT Aero Commonly known as:  DULERA Inhale 2 puffs into the lungs 2 (two) times daily.   nystatin powder Generic drug:  nystatin Please apply to skin under breasts twice daily until rash resolves.   predniSONE 10 MG tablet Commonly known as:  DELTASONE Take 4 tablets (40 mg total) by mouth daily  with breakfast for 3 days, THEN 3 tablets (30 mg total) daily with breakfast for 3 days, THEN 2 tablets (20 mg total) daily with breakfast for 3 days, THEN 1 tablet (10 mg total) daily with breakfast for 3 days, THEN 0.5 tablets (5 mg total) daily with breakfast for 3 days. Start taking on:  09/24/2018 What changed:    medication strength  See the new instructions.   ranitidine 75 MG tablet Commonly known as:  ZANTAC Take 75 mg by mouth 3 (three) times daily as needed for heartburn.   rOPINIRole 0.25 MG tablet Commonly known as:  REQUIP Take 1 tablet (0.25 mg total) by mouth at bedtime. What changed:    when to take this  reasons to take this   sertraline 100 MG tablet Commonly known as:  ZOLOFT Take 100 mg by mouth 2 (two) times daily.   traZODone 150 MG tablet Commonly known as:  DESYREL Take 1 tablet (150 mg total) by mouth at bedtime as needed for sleep. What changed:  when to take this            Durable Medical Equipment  (From admission, onward)         Start     Ordered   09/24/18 0000  DME Nebulizer machine    Question:  Patient needs a nebulizer to treat with the following condition  Answer:  Asthma   09/24/18 1156   09/21/18 1042  For home use only DME  Nebulizer machine  Once    Question:  Patient needs a nebulizer to treat with the following condition  Answer:  Asthma   09/21/18 1042          Disposition and follow-up:   [name] [name] was discharged from [facility] in Good condition.  At the hospital follow up visit please address:  1.  COPD exacerbation with asthma: ensure respiratory status has continued to improve. [name] [name] was initiated given combined Asthma/COPD.   Right breast lesion: may benefit from ultrasound for diagnostic purposes. Continue with outpatient mammography screenings.   2.  Labs / imaging needed at time of follow-up: none  3.  Pending labs/ test needing follow-up: none   Follow-up  Appointments: [facility] Follow up.   Why:  home nebulizer machine Contact information: [address] [place] [address] [phone]        Bartholomew Crews, MD Follow up on 10/02/2018.   Specialty:  Internal Medicine Why:  @10 :[address] Contact information: [place] [address] [place] Hospital Course by problem list: [name] [name] is a 62 y.o female with concurrent COPD/Asthma who presented with progressive dyspnea and URI symptoms. She was subsequently admitted for an acute asthma/COPD exacerbation in the setting of viral bronchitis. 1. Acute exacerbation of COPD with asthma: Her cough improved with Robitussin, Tessalon perls. She was started on slow Prednisone taper duonebs. Given her history of combined COPD and asthma, combined ICS + LABA was initiated for maintenance therapy with Dulera. She initially required oxygen supplementation, but was able to be weaned to room air over the next few days with improvement in respiratory symptoms.   2. Right breast lesion: an erythematous nodule to the right of the right breast was noted on physical exam. Appears to have drained in the past. There was no diffuse erythema or peru'd orange appearance to suggest inflammatory breast cancer. Mostly likely an epidermoid cyst. Will need to be followed outpatient. May benefit from ultrasound for diagnostic purposes. Continue with outpatient mammography screening.   Discharge Vitals:   BP 117/66 (BP Location: Right Arm)   Pulse 70   Temp 98.4 F (36.9 C) (Oral)   Resp 16   Ht 5\' 8"  (1.727 m)   Wt (!) 142.5 kg   SpO2 93%   BMI 47.77 kg/m   Pertinent Labs, Studies, and Procedures:  none  Discharge Instructions: Discharge Instructions    Call MD for:  difficulty breathing, headache or visual disturbances   Complete by:  As directed    Call MD for:  persistant dizziness or light-headedness   Complete  by:  As directed    Call MD for:  temperature >100.4   Complete by:  As directed    DME Nebulizer machine   Complete by:  As directed    Patient needs a nebulizer to treat with the following condition:  Asthma   Diet - low sodium heart healthy   Complete by:  As directed    Discharge instructions   Complete by:  As directed    Ms. Halliday, it was a pleasure taking care of you! I am so glad you are feeling better. For your cough, you can continue taking the Tessalon perls and Robitussin. We will continue you on a slow Prednisone taper for 2 weeks. You have been started back on a maintenance inhaler for your asthma (  Dulera) to hopefully prevent symptom flare-up. We will see you back in our clinic for a hospital follow-up appointment.  Take care! Dr. Koleen Distance   Increase activity slowly   Complete by:  As directed       Signed: Delice Bison, DO 09/24/2018, 3:05 PM   Pager: [phone]

## 2018-09-24 NOTE — Progress Notes (Signed)
   Subjective: [name] [name] was seen and evaluated on morning rounds. No acute events overnight. She is feeling much better and is ready to go home. Denies shortness of breath or chest pain. She has been able to ambulate down the hall without oxygen. Discussed plan for discharge today with outpatient follow-up in our clinic.   Objective:  Vital signs in last 24 hours: Vitals:   09/23/18 2004 09/23/18 2033 09/24/18 0450 09/24/18 0932  BP:  129/66 117/66   Pulse:  70 70   Resp:  18 16   Temp:  98.2 F (36.8 C) 98.4 F (36.9 C)   TempSrc:  Oral Oral   SpO2: 95% 93% 92% 93%  Weight:      Height:       General: awake, alert, pleasant female sitting up in her chair in NAD CV: RRR; no murmurs, rubs or gallops Pulm: normal respiratory effort; good air movement without wheezing or crackles  Abd: BS+; abdomen is soft, non-distended, non-tender   Assessment/Plan:  Principal Problem:   Acute exacerbation of COPD with asthma (HCC) Active Problems:   Acute on chronic respiratory failure with hypoxia (HCC)   Shortness of breath  [name] [name] is a 62 y.o female with concurrent COPD/Asthma who presented with progressive dyspnea and URI symptoms. She was subsequently admitted for an acute asthma/COPD exacerbation in the setting of viral bronchitis.  Acute exacerbation of COPD with asthma -2/2 viral bronchitis -Will continue 14 day prednisone taper at discharge  - Robitussin, Tessalon perls for cough -Patient given new prescription for nebulizer machine with solution   -patient is able to ambulate without dyspnea and is saturating well on room air  - Continue Dulera 100-5 two puffs BID for maintenance. Although ICS have been shown to increase COPD exacerbations, in the select patient population with concurrent Asthma/COPD they are beneficial. Therefore, Dulera (ICS + LABA) should be a good maintenance medication for this patient.   Right Breast Lesion  - Erythematous nodule to the  right of the areola on the right breast - Appears to have drained in the past  - No diffuse erythema or peu'd orange appearance to suggest inflammatory breast cancer. Likely an epidermoid cyst; however, will need to be followed up as outpatient. Ultrasound may be of help for diagnostic purposes.  - Outpatient mammography for screening   Dispo: Anticipated discharge home today.   Modena Nunnery D, DO 09/24/2018, 1:10 PM Pager: [phone]

## 2018-09-24 NOTE — Progress Notes (Signed)
Internal Medicine Attending:   I saw and examined the patient. I reviewed the resident's note and I agree with the resident's findings and plan as documented in the resident's note.  Patient feels well today with no new complaints.  Patient was admitted with an acute exacerbation of COPD/asthma.  We will continue with a 14-day prednisone taper.  Patient was started on [facility] for maintenance for her asthma.  We will continue with this at discharge.  Patient is ablating well with good O2 sats on room air.  No further work-up at this time.  Patient stable for discharge home today.

## 2018-09-27 ENCOUNTER — Other Ambulatory Visit: Payer: Self-pay | Admitting: *Deleted

## 2018-09-27 NOTE — Telephone Encounter (Signed)
Clarification: Fax from [employer] - wants to know Albuterol neb solution- if the concentration is what u want, 0.5% (51ml/ml) conc  Or can they dispense the premixed conc of 0.083%? Thanks

## 2018-09-27 NOTE — Telephone Encounter (Signed)
Hi Glenda, Sorry for the confusion. That's fine to dispense their pre-mixed one. Thanks for checking!

## 2018-09-27 NOTE — Telephone Encounter (Signed)
Called pharmacy, they will use the premixed

## 2018-09-27 NOTE — Telephone Encounter (Signed)
No answer

## 2018-10-01 ENCOUNTER — Encounter: Payer: Self-pay | Admitting: Internal Medicine

## 2018-10-01 ENCOUNTER — Telehealth: Payer: Self-pay | Admitting: *Deleted

## 2018-10-01 ENCOUNTER — Ambulatory Visit (INDEPENDENT_AMBULATORY_CARE_PROVIDER_SITE_OTHER): Payer: Medicare HMO | Admitting: Internal Medicine

## 2018-10-01 ENCOUNTER — Other Ambulatory Visit: Payer: Self-pay

## 2018-10-01 VITALS — BP 132/61 | HR 91 | Temp 97.5°F | Ht 68.0 in | Wt 311.9 lb

## 2018-10-01 DIAGNOSIS — Z7952 Long term (current) use of systemic steroids: Secondary | ICD-10-CM | POA: Diagnosis not present

## 2018-10-01 DIAGNOSIS — N6310 Unspecified lump in the right breast, unspecified quadrant: Secondary | ICD-10-CM | POA: Diagnosis not present

## 2018-10-01 DIAGNOSIS — J449 Chronic obstructive pulmonary disease, unspecified: Secondary | ICD-10-CM | POA: Diagnosis not present

## 2018-10-01 DIAGNOSIS — Z0000 Encounter for general adult medical examination without abnormal findings: Secondary | ICD-10-CM

## 2018-10-01 DIAGNOSIS — E662 Morbid (severe) obesity with alveolar hypoventilation: Secondary | ICD-10-CM | POA: Diagnosis not present

## 2018-10-01 DIAGNOSIS — Z6841 Body Mass Index (BMI) 40.0 and over, adult: Secondary | ICD-10-CM

## 2018-10-01 DIAGNOSIS — Z79899 Other long term (current) drug therapy: Secondary | ICD-10-CM | POA: Diagnosis not present

## 2018-10-01 DIAGNOSIS — Z7951 Long term (current) use of inhaled steroids: Secondary | ICD-10-CM

## 2018-10-01 DIAGNOSIS — N649 Disorder of breast, unspecified: Secondary | ICD-10-CM

## 2018-10-01 NOTE — Telephone Encounter (Addendum)
Call to [facility] information given for PA for [facility].  Determination in 24-72 hours.  Reference # [id number] at [phone].  Sander Nephew, RN 10/01/2018 11:10 AM. Denial for [facility] received.  Patient will need to try and fail Symbicort HFA or Breo Ellipta. Patient has been on Symbicort previously.  Spoke with representative from [place] .  Drs. [place] and Lynnae January to be notified of the denial and the need to appeal or have the patient try the [employer] which is on the formulary.  Form to be placed in Dr. Janne Napoleon box for consideration.  Sander Nephew, RN 10/10/2018 11:38 AM.

## 2018-10-01 NOTE — Progress Notes (Signed)
   CC: asthma/COPD follow-up  HPI:   [name] is a 62 y.o. female with a history of obesity, asthma, COPD, and OSA as well as the other medical conditions listed below who presents to the internal medicine clinic for asthma and COPD follow-up after being discharged from the hospital one week ago. Please see problem based charting for the history and status of the patient's current and chronic medical conditions.   Past Medical History:  Diagnosis Date  . Anxiety   . Asthma    Chronic, with restrictive component 2/2 obesity  . Depression   . GERD (gastroesophageal reflux disease)   . Headache   . Insomnia   . Obesity   . On home oxygen therapy    "3L at night only when I get sick" (04/30/2014)  . Shortness of breath dyspnea   . Sleep apnea    "couldn't afford mask so I didn't get it" (04/30/2014)    Review of Systems:   Pertinent positives mentioned in HPI. Remainder of all ROS negative.  Physical Exam: Vitals:   10/01/18 1028  BP: 132/61  Pulse: 91  Temp: (!) 97.5 F (36.4 C)  TempSrc: Oral  SpO2: 95%  Weight: (!) 311 lb 14.4 oz (141.5 kg)  Height: 5\' 8"  (1.727 m)   Physical Exam  Constitutional: Obese. No distress. Eyes: Pupils are equal, round, and reactive to light. EOM are normal.  Cardiovascular: Normal rate and regular rhythm. No murmurs, rubs, or gallops. Pulmonary/Chest: Effort normal. Decreased breath sounds bilaterally. Faint end-expiratory wheezes diffusely. Breast: There is a superficial erythematous papule/nodule on the right breast just inferolateral to the areola. There is no tenderness, increased warmth, drainage, surrounding erythema, or fluctuance.  Abdominal: Bowel sounds present. Soft, non-distended, non-tender. Ext: No lower extremity edema. Skin: Warm and dry.    Assessment & Plan:   See Encounters Tab for problem based charting.  Patient seen with Dr. Rebeca Alert

## 2018-10-01 NOTE — Patient Instructions (Signed)
It was a pleasure meeting you today, [name] [name]!  1. For your asthma/COPD - Your breathing continues to improve since you were hospitalized. - Continue your Dulera twice a day and breathing treatments as needed. - Continue the prednisone taper until you run out - Please come back in 1-2 months for reassessment - We will consider getting pulmonary function testing at that time.  2. For the breast lesion - This is most likely a cyst of the skin  - Since you are overdue for a mammogram, I have ordered a screening mammogram.  Please follow-up in 1-2 months  Feel free to call our clinic at [phone] if you have any questions!  Thanks, Dr. Annie Paras

## 2018-10-01 NOTE — Assessment & Plan Note (Signed)
HPI: During her hospitalization, the patient was noted to have an erythematous papule/nodule on the right breast. She reports that this area came to a head and drained prior to hospitalization. She has no tenderness or drainage from the area currently.   Assessment: This area likely represents a healing epidermal cyst. The mass feels superficial and is not concerning for malignancy. However, the patient is overdue for a mammogram and would like to do this soon.   Plan 1. Reassess breast lesion at next visit  2. Screening mammogram

## 2018-10-01 NOTE — Assessment & Plan Note (Signed)
HPI: Patient was discharged from the hospital on 11/18 after an admission for asthma/COPD exacerbation. She was started on Dulera BID and discharged on a 14-day prednisone taper. [name] [name] reports that she feels much better than she did in the hospital. She has some dyspnea on exertion, but this is about the same as it was prior to hospitalization. She denies coughing. She has been using the Dulera BID and doing albuterol breathing treatments on her home nebulizer about three times a day as needed. She feels that these treatments are very helpful for her breathing.   Assessment: Patient appears well overall. She has decreased breath sounds with faint end-expiratory wheezing on exam. She still has another week of the prednisone taper. The patient would benefit from repeat PFTs in the future once her exacerbation has completely resolved. She likely has a mixture of asthma, COPD, and obesity hypoventilation syndrome.   Plan 1. Continue prednisone taper 2. Continue Dulera BID 3. Continue rescue albuterol  4. Follow-up in one month for reassessment and consideration of repeat PFTs

## 2018-10-02 ENCOUNTER — Ambulatory Visit (INDEPENDENT_AMBULATORY_CARE_PROVIDER_SITE_OTHER): Payer: Medicare HMO | Admitting: Physician Assistant

## 2018-10-02 ENCOUNTER — Encounter: Payer: Self-pay | Admitting: Physician Assistant

## 2018-10-02 DIAGNOSIS — F39 Unspecified mood [affective] disorder: Secondary | ICD-10-CM

## 2018-10-02 DIAGNOSIS — F429 Obsessive-compulsive disorder, unspecified: Secondary | ICD-10-CM

## 2018-10-02 DIAGNOSIS — F411 Generalized anxiety disorder: Secondary | ICD-10-CM | POA: Diagnosis not present

## 2018-10-02 DIAGNOSIS — G4700 Insomnia, unspecified: Secondary | ICD-10-CM | POA: Diagnosis not present

## 2018-10-02 MED ORDER — ALPRAZOLAM 1 MG PO TABS: 0.5000 mg | ORAL_TABLET | Freq: Two times a day (BID) | ORAL | 1 refills | Status: DC | PRN

## 2018-10-02 NOTE — Progress Notes (Signed)
[facility] Med Check  Patient ID: [name] [name],  MRN: [id number]  PCP: Bartholomew Crews, MD  Date of Evaluation: 10/02/2018 Time spent:15 minutes  Chief Complaint:  Chief Complaint    Follow-up      HISTORY/CURRENT STATUS: HPI Here for 6 month med check.   Was in the hospital a few weeks ago for bad COPD.  "I nearly died.  I'm glad to be alive. Before I got sick, I was doing fine.  I feel like mentally I'm fine."  Patient denies loss of interest in usual activities and is able to enjoy things.  Denies decreased energy or motivation.  Appetite has not changed.  No extreme sadness, tearfulness, or feelings of hopelessness.  Denies any changes in concentration, making decisions or remembering things.  Denies suicidal or homicidal thoughts.  Patient denies increased energy with decreased need for sleep, no increased talkativeness, no racing thoughts, no impulsivity or risky behaviors, no increased spending, no increased libido, no grandiosity.  Anxiety is pretty well controlled with the Xanax.  She still has a lot of anxiety when riding in a car.  She is constantly afraid that something is going to happen.    She sleeps good most of the time as long as she has the trazodone.  Individual Medical History/ Review of Systems: Changes? :Yes Hospitalization for COPD  Past medications for mental health diagnoses include: Wellbutrin, Xanax, Zoloft, trazodone, gabapentin, BuSpar, Ativan, Klonopin,  Allergies: Ibuprofen  Current Medications:  Current Outpatient Medications:  .  acetaminophen (TYLENOL) 500 MG tablet, Take 1,000 mg by mouth every 6 (six) hours as needed (for pain OR back pain). , Disp: , Rfl:  .  albuterol (PROVENTIL HFA;VENTOLIN HFA) 108 (90 Base) MCG/ACT inhaler, Inhale 2 puffs into the lungs every 6 (six) hours as needed for wheezing or shortness of breath., Disp: 1 Inhaler, Rfl: 2 .  albuterol (PROVENTIL) (5 MG/ML) 0.5% nebulizer solution, Take 0.5 mLs (2.5 mg  total) by nebulization every 6 (six) hours as needed for wheezing or shortness of breath., Disp: 20 mL, Rfl: 12 .  ALPRAZolam (XANAX) 1 MG tablet, Take 0.5 mg by mouth 2 (two) times daily as needed for anxiety., Disp: , Rfl:  .  benzonatate (TESSALON PERLES) 100 MG capsule, Take 1 capsule (100 mg total) by mouth every 6 (six) hours as needed for cough., Disp: 30 capsule, Rfl: 1 .  mometasone-formoterol (DULERA) 100-5 MCG/ACT AERO, Inhale 2 puffs into the lungs 2 (two) times daily., Disp: 8.8 g, Rfl: 1 .  nystatin (MYCOSTATIN/NYSTOP) 100000 UNIT/GM POWD, Please apply to skin under breasts twice daily until rash resolves., Disp: 30 g, Rfl: 1 .  ranitidine (ZANTAC) 75 MG tablet, Take 75 mg by mouth 3 (three) times daily as needed for heartburn. , Disp: , Rfl:  .  rOPINIRole (REQUIP) 0.25 MG tablet, Take 1 tablet (0.25 mg total) by mouth at bedtime. (Patient taking differently: Take 0.25 mg by mouth at bedtime as needed (for restless legs). ), Disp: 90 tablet, Rfl: 3 .  sertraline (ZOLOFT) 100 MG tablet, Take 250 mg by mouth daily. , Disp: , Rfl:  .  traZODone (DESYREL) 150 MG tablet, Take 1 tablet (150 mg total) by mouth at bedtime as needed for sleep. (Patient taking differently: Take 150 mg by mouth at bedtime. ), Disp: 90 tablet, Rfl: 3 .  guaiFENesin-dextromethorphan (ROBITUSSIN DM) 100-10 MG/5ML syrup, Take 5 mLs by mouth every 4 (four) hours as needed for cough. (Patient not taking: Reported on 10/02/2018), Disp: 118  mL, Rfl: 0 .  predniSONE (DELTASONE) 10 MG tablet, Take 4 tablets (40 mg total) by mouth daily with breakfast for 3 days, THEN 3 tablets (30 mg total) daily with breakfast for 3 days, THEN 2 tablets (20 mg total) daily with breakfast for 3 days, THEN 1 tablet (10 mg total) daily with breakfast for 3 days, THEN 0.5 tablets (5 mg total) daily with breakfast for 3 days. (Patient not taking: Reported on 10/02/2018), Disp: 31.5 tablet, Rfl: 0 Medication Side Effects: none  Family Medical/  Social History: Changes? No  MENTAL HEALTH EXAM:  There were no vitals taken for this visit.There is no height or weight on file to calculate BMI.  General Appearance: Casual and Obese  Eye Contact:  Good  Speech:  Clear and Coherent  Volume:  Normal  Mood:  Euthymic  Affect:  Appropriate  Thought Process:  Goal Directed  Orientation:  Full (Time, Place, and Person)  Thought Content: Logical   Suicidal Thoughts:  No  Homicidal Thoughts:  No  Memory:  WNL  Judgement:  Good  Insight:  Good  Psychomotor Activity:  Normal  Concentration:  Concentration: Good  Recall:  Good  Fund of Knowledge: Good  Language: Good  Assets:  Desire for Improvement  ADL's:  Intact  Cognition: WNL  Prognosis:  Good    DIAGNOSES:    ICD-10-CM   1. Episodic mood disorder (HCC) F39   2. Generalized anxiety disorder F41.1   3. Obsessive-compulsive disorder, unspecified type F42.9   4. Insomnia, unspecified type G47.00   Recent hospitalization for COPD exacerbation.  OSA, obesity, GERD  Receiving Psychotherapy: No    RECOMMENDATIONS: As far as her mental health goes, I believe she is stable.  As far as the extreme anxiety when traveling in a car, I have reminded her to take a Xanax prior to riding anywhere.  Of course do not take 1 if she is driving.  States she does not drive hardly at all. Continue all current medications. Return in 6 months or sooner as needed.  Donnal Moat, PA-C

## 2018-10-03 NOTE — Progress Notes (Signed)
Internal Medicine Clinic Attending  I saw and evaluated the patient.  I personally confirmed the key portions of the history and exam documented by Dr. Dorrell and I reviewed pertinent patient test results.  The assessment, diagnosis, and plan were formulated together and I agree with the documentation in the resident's note.  Alexander Raines, M.D., Ph.D.  

## 2018-10-10 NOTE — Telephone Encounter (Signed)
I will send you the forms needed to do an appeal if you want her to get the [facility].

## 2018-10-10 NOTE — Telephone Encounter (Signed)
Yes please

## 2018-10-10 NOTE — Telephone Encounter (Signed)
Do I need to do anything for the pre-auth?

## 2018-10-16 NOTE — Telephone Encounter (Signed)
Appt  completed on 10/01/2018

## 2018-10-21 DIAGNOSIS — G4733 Obstructive sleep apnea (adult) (pediatric): Secondary | ICD-10-CM | POA: Diagnosis not present

## 2018-10-21 DIAGNOSIS — J9611 Chronic respiratory failure with hypoxia: Secondary | ICD-10-CM | POA: Diagnosis not present

## 2018-10-21 DIAGNOSIS — J441 Chronic obstructive pulmonary disease with (acute) exacerbation: Secondary | ICD-10-CM | POA: Diagnosis not present

## 2018-10-21 DIAGNOSIS — J45901 Unspecified asthma with (acute) exacerbation: Secondary | ICD-10-CM | POA: Diagnosis not present

## 2018-10-21 DIAGNOSIS — S82399S Other fracture of lower end of unspecified tibia, sequela: Secondary | ICD-10-CM | POA: Diagnosis not present

## 2018-11-08 ENCOUNTER — Other Ambulatory Visit: Payer: Self-pay | Admitting: Internal Medicine

## 2018-11-08 MED ORDER — FLUTICASONE FUROATE-VILANTEROL 100-25 MCG/INH IN AEPB: 1.0000 | INHALATION_SPRAY | Freq: Every day | RESPIRATORY_TRACT | 5 refills | Status: DC

## 2018-11-08 NOTE — Progress Notes (Signed)
breo

## 2018-11-12 ENCOUNTER — Ambulatory Visit
Admission: RE | Admit: 2018-11-12 | Discharge: 2018-11-12 | Disposition: A | Discharge status: Home / Self Care | Payer: Medicare HMO | Source: Ambulatory Visit | Attending: Internal Medicine | Admitting: Internal Medicine

## 2018-11-12 DIAGNOSIS — Z0000 Encounter for general adult medical examination without abnormal findings: Secondary | ICD-10-CM

## 2018-11-13 ENCOUNTER — Other Ambulatory Visit: Payer: Self-pay | Admitting: Internal Medicine

## 2018-11-13 DIAGNOSIS — N649 Disorder of breast, unspecified: Secondary | ICD-10-CM

## 2018-11-19 ENCOUNTER — Other Ambulatory Visit: Payer: Self-pay

## 2018-11-20 ENCOUNTER — Ambulatory Visit
Admission: RE | Admit: 2018-11-20 | Discharge: 2018-11-20 | Disposition: A | Discharge status: Home / Self Care | Payer: Medicare HMO | Source: Ambulatory Visit | Attending: Internal Medicine | Admitting: Internal Medicine

## 2018-11-20 DIAGNOSIS — N6313 Unspecified lump in the right breast, lower outer quadrant: Secondary | ICD-10-CM | POA: Diagnosis not present

## 2018-11-20 DIAGNOSIS — R928 Other abnormal and inconclusive findings on diagnostic imaging of breast: Secondary | ICD-10-CM | POA: Diagnosis not present

## 2018-11-20 DIAGNOSIS — N649 Disorder of breast, unspecified: Secondary | ICD-10-CM

## 2018-11-21 DIAGNOSIS — J9611 Chronic respiratory failure with hypoxia: Secondary | ICD-10-CM | POA: Diagnosis not present

## 2018-11-21 DIAGNOSIS — J45901 Unspecified asthma with (acute) exacerbation: Secondary | ICD-10-CM | POA: Diagnosis not present

## 2018-11-21 DIAGNOSIS — S82399S Other fracture of lower end of unspecified tibia, sequela: Secondary | ICD-10-CM | POA: Diagnosis not present

## 2018-11-21 DIAGNOSIS — J441 Chronic obstructive pulmonary disease with (acute) exacerbation: Secondary | ICD-10-CM | POA: Diagnosis not present

## 2018-11-21 DIAGNOSIS — G4733 Obstructive sleep apnea (adult) (pediatric): Secondary | ICD-10-CM | POA: Diagnosis not present

## 2018-12-13 ENCOUNTER — Other Ambulatory Visit: Payer: Self-pay | Admitting: Internal Medicine

## 2018-12-14 NOTE — Telephone Encounter (Signed)
From Nov - if still having cough, needs appt

## 2018-12-22 DIAGNOSIS — J45901 Unspecified asthma with (acute) exacerbation: Secondary | ICD-10-CM | POA: Diagnosis not present

## 2018-12-22 DIAGNOSIS — J441 Chronic obstructive pulmonary disease with (acute) exacerbation: Secondary | ICD-10-CM | POA: Diagnosis not present

## 2018-12-22 DIAGNOSIS — J9611 Chronic respiratory failure with hypoxia: Secondary | ICD-10-CM | POA: Diagnosis not present

## 2018-12-22 DIAGNOSIS — G4733 Obstructive sleep apnea (adult) (pediatric): Secondary | ICD-10-CM | POA: Diagnosis not present

## 2018-12-22 DIAGNOSIS — S82399S Other fracture of lower end of unspecified tibia, sequela: Secondary | ICD-10-CM | POA: Diagnosis not present

## 2019-01-08 ENCOUNTER — Encounter: Payer: Self-pay | Admitting: Physician Assistant

## 2019-01-08 ENCOUNTER — Ambulatory Visit (INDEPENDENT_AMBULATORY_CARE_PROVIDER_SITE_OTHER): Payer: Medicare HMO | Admitting: Physician Assistant

## 2019-01-08 DIAGNOSIS — F429 Obsessive-compulsive disorder, unspecified: Secondary | ICD-10-CM | POA: Diagnosis not present

## 2019-01-08 DIAGNOSIS — F411 Generalized anxiety disorder: Secondary | ICD-10-CM | POA: Diagnosis not present

## 2019-01-08 DIAGNOSIS — F39 Unspecified mood [affective] disorder: Secondary | ICD-10-CM | POA: Diagnosis not present

## 2019-01-08 DIAGNOSIS — G4700 Insomnia, unspecified: Secondary | ICD-10-CM | POA: Diagnosis not present

## 2019-01-08 MED ORDER — SERTRALINE HCL 100 MG PO TABS: 250.0000 mg | ORAL_TABLET | Freq: Every day | ORAL | 1 refills | Status: DC

## 2019-01-08 MED ORDER — ALPRAZOLAM 1 MG PO TABS: 0.5000 mg | ORAL_TABLET | Freq: Two times a day (BID) | ORAL | 1 refills | Status: DC | PRN

## 2019-01-08 NOTE — Progress Notes (Signed)
[facility]  Patient ID: [name] [name],  MRN: [id number]  PCP: Bartholomew Crews, MD  Date of Evaluation: 01/08/2019 Time spent:15 minutes  Chief Complaint:  Chief Complaint    Follow-up      HISTORY/CURRENT STATUS: HPI Here for routine med check.  Patient denies loss of interest in usual activities and is able to enjoy things.  Denies decreased energy or motivation.  Appetite has not changed.  No extreme sadness, tearfulness, or feelings of hopelessness.  Denies any changes in concentration, making decisions or remembering things.  Denies suicidal or homicidal thoughts.  Patient denies increased energy with decreased need for sleep, no increased talkativeness, no racing thoughts, no impulsivity or risky behaviors, no increased spending, no increased libido, no grandiosity.  Anxiety is mostly controlled.  Still gets anxious when riding in a car.  "If I did not have the Klonopin, I do not know what I would do."  States OCD symptoms are better controlled.  She does not obsess about things like she was.  She sleeps well she has trazodone and does not need it every night.  Denies muscle or joint pain, stiffness, or dystonia.  Denies dizziness, syncope, seizures, numbness, tingling, tremor, tics, unsteady gait, slurred speech, confusion.   Individual Medical History/ Review of Systems: Changes? :Yes was in hospital for 4 days since LOV.  Had bronchitis, COPD.  Better now.   Past medications for mental health diagnoses include: Wellbutrin, Xanax, Zoloft, trazodone, gabapentin, BuSpar, Ativan, Klonopin,  Allergies: Ibuprofen  Current Medications:  Current Outpatient Medications:  .  acetaminophen (TYLENOL) 500 MG tablet, Take 1,000 mg by mouth every 6 (six) hours as needed (for pain OR back pain). , Disp: , Rfl:  .  albuterol (PROVENTIL HFA;VENTOLIN HFA) 108 (90 Base) MCG/ACT inhaler, Inhale 2 puffs into the lungs every 6 (six) hours as needed for wheezing or  shortness of breath., Disp: 1 Inhaler, Rfl: 2 .  albuterol (PROVENTIL) (5 MG/ML) 0.5% nebulizer solution, Take 0.5 mLs (2.5 mg total) by nebulization every 6 (six) hours as needed for wheezing or shortness of breath., Disp: 20 mL, Rfl: 12 .  ALPRAZolam (XANAX) 1 MG tablet, Take 0.5-1 tablets (0.5-1 mg total) by mouth 2 (two) times daily as needed for anxiety., Disp: 180 tablet, Rfl: 1 .  nystatin (MYCOSTATIN/NYSTOP) 100000 UNIT/GM POWD, Please apply to skin under breasts twice daily until rash resolves., Disp: 30 g, Rfl: 1 .  sertraline (ZOLOFT) 100 MG tablet, Take 2.5 tablets (250 mg total) by mouth daily., Disp: 225 tablet, Rfl: 1 .  traZODone (DESYREL) 150 MG tablet, Take 1 tablet (150 mg total) by mouth at bedtime as needed for sleep. (Patient taking differently: Take 150 mg by mouth at bedtime. ), Disp: 90 tablet, Rfl: 3 .  benzonatate (TESSALON PERLES) 100 MG capsule, Take 1 capsule (100 mg total) by mouth every 6 (six) hours as needed for cough. (Patient not taking: Reported on 01/08/2019), Disp: 30 capsule, Rfl: 1 .  fluticasone furoate-vilanterol (BREO ELLIPTA) 100-25 MCG/INH AEPB, Inhale 1 puff into the lungs daily. (Patient not taking: Reported on 01/08/2019), Disp: 1 each, Rfl: 5 .  guaiFENesin-dextromethorphan (ROBITUSSIN DM) 100-10 MG/5ML syrup, Take 5 mLs by mouth every 4 (four) hours as needed for cough. (Patient not taking: Reported on 10/02/2018), Disp: 118 mL, Rfl: 0 .  ranitidine (ZANTAC) 75 MG tablet, Take 75 mg by mouth 3 (three) times daily as needed for heartburn. , Disp: , Rfl:  .  rOPINIRole (REQUIP) 0.25 MG tablet, Take  1 tablet (0.25 mg total) by mouth at bedtime. (Patient not taking: Reported on 01/08/2019), Disp: 90 tablet, Rfl: 3 Medication Side Effects: none  Family Medical/ Social History: Changes? No  MENTAL HEALTH EXAM:  There were no vitals taken for this visit.There is no height or weight on file to calculate BMI.  General Appearance: Casual, Well Groomed and Obese   Eye Contact:  Good  Speech:  Clear and Coherent  Volume:  Normal  Mood:  Euthymic  Affect:  Appropriate  Thought Process:  Goal Directed  Orientation:  Full (Time, Place, and Person)  Thought Content: Logical   Suicidal Thoughts:  No  Homicidal Thoughts:  No  Memory:  WNL  Judgement:  Good  Insight:  Good  Psychomotor Activity:  Normal  Concentration:  Concentration: Good  Recall:  Good  Fund of Knowledge: Good  Language: Good  Assets:  Desire for Improvement  ADL's:  Intact  Cognition: WNL  Prognosis:  Good    DIAGNOSES:    ICD-10-CM   1. Episodic mood disorder (HCC) F39   2. Insomnia, unspecified type G47.00   3. Generalized anxiety disorder F41.1   4. Obsessive-compulsive disorder, unspecified type F42.9     Receiving Psychotherapy: No    RECOMMENDATIONS: Continue Xanax 1 mg p.o. twice daily PRN. Continue Zoloft 250 mg daily. Continue trazodone 150 mg nightly as needed. Discussed sleep hygiene and coping mechanisms for anxiety. Return in 6 months or sooner as needed.  Donnal Moat, PA-C   This record has been created using [employer].  Chart creation errors have been sought, but may not always have been located and corrected. Such creation errors do not reflect on the standard of medical care.

## 2019-01-20 DIAGNOSIS — S82399S Other fracture of lower end of unspecified tibia, sequela: Secondary | ICD-10-CM | POA: Diagnosis not present

## 2019-01-20 DIAGNOSIS — J441 Chronic obstructive pulmonary disease with (acute) exacerbation: Secondary | ICD-10-CM | POA: Diagnosis not present

## 2019-01-20 DIAGNOSIS — G4733 Obstructive sleep apnea (adult) (pediatric): Secondary | ICD-10-CM | POA: Diagnosis not present

## 2019-01-20 DIAGNOSIS — J9611 Chronic respiratory failure with hypoxia: Secondary | ICD-10-CM | POA: Diagnosis not present

## 2019-02-06 ENCOUNTER — Ambulatory Visit (INDEPENDENT_AMBULATORY_CARE_PROVIDER_SITE_OTHER): Payer: Medicare HMO | Admitting: Internal Medicine

## 2019-02-06 ENCOUNTER — Telehealth: Payer: Self-pay

## 2019-02-06 ENCOUNTER — Other Ambulatory Visit: Payer: Self-pay

## 2019-02-06 DIAGNOSIS — R21 Rash and other nonspecific skin eruption: Secondary | ICD-10-CM

## 2019-02-06 DIAGNOSIS — G4733 Obstructive sleep apnea (adult) (pediatric): Secondary | ICD-10-CM

## 2019-02-06 DIAGNOSIS — K219 Gastro-esophageal reflux disease without esophagitis: Secondary | ICD-10-CM | POA: Diagnosis not present

## 2019-02-06 DIAGNOSIS — R42 Dizziness and giddiness: Secondary | ICD-10-CM

## 2019-02-06 DIAGNOSIS — F339 Major depressive disorder, recurrent, unspecified: Secondary | ICD-10-CM | POA: Diagnosis not present

## 2019-02-06 DIAGNOSIS — M25561 Pain in right knee: Secondary | ICD-10-CM | POA: Diagnosis not present

## 2019-02-06 DIAGNOSIS — J449 Chronic obstructive pulmonary disease, unspecified: Secondary | ICD-10-CM | POA: Diagnosis not present

## 2019-02-06 DIAGNOSIS — I700 Atherosclerosis of aorta: Secondary | ICD-10-CM

## 2019-02-06 DIAGNOSIS — G4761 Periodic limb movement disorder: Secondary | ICD-10-CM | POA: Diagnosis not present

## 2019-02-06 DIAGNOSIS — J4489 Other specified chronic obstructive pulmonary disease: Secondary | ICD-10-CM

## 2019-02-06 MED ORDER — MELOXICAM 7.5 MG PO TABS: 7.5000 mg | ORAL_TABLET | Freq: Every day | ORAL | 2 refills | Status: DC

## 2019-02-06 NOTE — Assessment & Plan Note (Signed)
Problem is chronic and stable.  She has both depression and anxiety and is working with mental health.  Her medications are Zoloft and Xanax.  She states everything he has been stable as they can be considering the current pandemic.  She is also on Zoloft.  PLAN : per mental health

## 2019-02-06 NOTE — Assessment & Plan Note (Signed)
This problem is new.  She states for at least 7 months that she has had a slow onset and worsening of balance issues.  This has not resulted in any fall.  She now has to use her hands to help her stand up due to weakness in her legs.  When she gets to a standing position, she has to stand still because her legs shake.  When she walks, she has to hold onto either her husband or a wall.  She feels that it is as if he is drunk.  This did not start after any medication change.  She states that she has good sensation in her feet.  She states that she has no weakness or stiffness in her upper extremities.  She is trying a cane but it is not working.  My differential is broad at this time that I would be worried about a posterior CNS issue, peripheral neuropathy, or a vestibular issue.  She is not interested in coming to the clinic for an appointment at that time but requests a walker with a wide seat.  I am willing to put the order in for this.  I also discussed having PT come to her house today can do a physical examination that she states that she is moving and will not be able to have PT in her home until May.  I discussed that I also want to get an MRI that again, she wants to wait.  PLAN : Gilford Rile with a wide seat Physical exam in [facility] once pandemic has subsided per patient choice MRI and PT and blood work in the future

## 2019-02-06 NOTE — Assessment & Plan Note (Signed)
Chronic and uncontrolled.  In 2017, he says dermatology and had to biopsy.  One was the right arm and the other was the left leg.  I later saw her and she had a rash of the right arm, right elbow, right hand, chest, and continued rash of left leg.  Today, she states all lesions have cleared except for the left leg.  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  I had encouraged her to see [facility] because I thought it was the leg, which is still present, that had mycosis fungoides in the differential but it was the arm which has resolved.  I will need to do a physical exam before making a final decision about referral to dermatology.  PLAN : PE at her next in-person appointment

## 2019-02-06 NOTE — Assessment & Plan Note (Signed)
This problem is chronic and uncontrolled.  At her last appointment a year ago she described waking up gasping for air and feeling sleepy during the day but not falling asleep while watching TV.  Her sleep study in 2016 showed mild OSA that she was unable to obtain CPAP.  She continues to endorse those symptoms.  Additionally, she has a personal pulse ox and when she wakes up at night to go to the bathroom, she will check it and it will be as low as 83%.  When she walks strenuously, she gets dyspneic and her O2 sat will be as low as 87%.  When that occurs, she is unable to speak to her husband until she sits down and rest.  She is a mouth breather at night.  Previous chest x-ray has been normal with the most recent in November 2019 with no significant abnormalities.  Her echo in 2017 showed mild LVH, grade 1 diastolic dysfunction, and mild dilation of the right ventricle.  The PA systolic pressure was unable to be estimated.  I am concerned about her hypoxia at night and hypoxia with exertion.  This likely has been chronic as there is no acute respiratory issues that would explain.  My differential would be OSA progressed since her last sleep study.  OHS is also a possibility along with a primarily right-sided cardiomyopathy.  I initially intended to prescribe home oxygen to be used at bedtime but as he is a mouth breather, that is not going to improve her oxygenation.  We discussed arranging for a home sleep study and she is in agreement with that.  Once the pandemic subsides, she will also need a repeat echo to assess for cardiomyopathy and pulmonary hypertension.  PLAN : home sleep study Future echo

## 2019-02-06 NOTE — Assessment & Plan Note (Signed)
This problem is chronic and stable.  She is on 2 inhalers with 1 of them being albuterol.  Anoro is her other inhaler on her medication list she states she is on an inhaler that starts with a D.  She got Dulera from pulmonology in 2014 but it is not on her medication list and when I looked at the pharmacy dispensed report, there is no inhaler other than albuterol.  She states she has used her albuterol about once a week.  She states her allergies have started to come home.  And that always worsens her asthma a little bit.  PLAN:  Cont current meds but need to figure out the name of the second inhaler

## 2019-02-06 NOTE — Assessment & Plan Note (Signed)
This problem is chronic and improved.  She has been on ranitidine and was getting it free through her mail order pharmacy but this medication has been removed from the market.  She has been a lot of diet manipulation and now her GERD symptoms are much improved and she does not need medication.  PLAN : follow

## 2019-02-06 NOTE — Assessment & Plan Note (Signed)
This problem is chronic and stable.  She is not a smoker nor is she diabetic.  She is not on a statin and we will need to discuss that at a future visit.  PLAN : discuss statin at a future appointment

## 2019-02-06 NOTE — Telephone Encounter (Signed)
Left message for patient for preference to which company she would like to use

## 2019-02-06 NOTE — Telephone Encounter (Signed)
-----   Message from Velora Heckler, RN sent at 02/06/2019 12:14 PM EDT ----- Regarding: DME walker  ----- Message ----- From: Bartholomew Crews, MD Sent: 02/06/2019  11:53 AM EDT To: Velora Heckler, RN  I put in DME walker order Thanks

## 2019-02-06 NOTE — Assessment & Plan Note (Addendum)
This problem is new.  It started about a month ago with a gradual increase in severity.  It was not preceded by any injury.  It is only on the posterior knee and does not radiate anywhere else.  She is alternating ice and heat.  There is no swelling or stiffness.  When she is on her feet, the pain increases.  When she sits down and puts ice on it the pain decreases until the ice is no longer cold.  After sitting for a fall, it is hard to stand up.  She had a knee x-ray in 2013 which showed a subtle nondisplaced fracture of the medial tibial plateau.  An x-ray in 2014 after a fall was normal.  I am unable to do a physical exam today.  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  I do not think this is a DVT as it is behind the knee and not in the calf.  I also do not think that this is a septic joint as she has no fever and no swelling indicating effusion.  I also do not think that this is a gout attack due to the gradual onset and duration.  She has tried tramadol, codeine, acetaminophen, and Requip without any relief.  I discussed that we needed to use the medicine with an anti-inflammatory component and recommended she get naproxen over-the-counter.  She has taken this medicine before without any side effects and does not think is going to help but is willing to give it a chance.  Additionally, I am going to send in Mobic to be used if she does not get relief with the naproxen.  If that does not work, we can try other nonsteroidal and/or obtain a plain film and/or an ultrasound which an U/S.  PLAN : Naproxen Mobic if naproxen does not work To call if no relief after trying these 2 nonsteroidal

## 2019-02-06 NOTE — Progress Notes (Signed)
   Subjective:    Patient ID: [name] [name], female    DOB: Nov 18, 1955, 63 y.o.   MRN: [id number]  [facility] Telephone Encounter  Reason for call:   This telephone encounter was created for Ms. [name] [name] on 02/06/2019 for the following purpose/cc R knee pain.   Consent and Medical Decision Making:  This is a telephone encounter between [employer] and [name] [name] on 02/06/2019. The visit was conducted with the patient located at home and [name] [name] at [facility]. The patient's identity was confirmed using their DOB and current address. The patient has consented to being evaluated through a telephone encounter and understands the associated risks (an examination cannot be done and the patient may need to come in for an appointment) / benefits (allows the patient to remain at home, decreasing exposure to coronavirus). I personally spent 25.5 minutes on medical discussion, call ending at 10:25.   Please see the A&P for the status of the pt's chronic medical problems.  ROS : per ROS section and in problem oriented charting. All other systems are negative.  PMHx, Soc hx, and / or Fam hx : She and her husband are moving to [place] in the next week or so.  Review of Systems  HENT: Positive for rhinorrhea and sneezing.   Eyes: Negative for itching.  Respiratory: Positive for apnea and shortness of breath.   Musculoskeletal: Positive for arthralgias and gait problem. Negative for myalgias.  Skin: Positive for rash.  Neurological: Positive for weakness. Negative for numbness.  Psychiatric/Behavioral: Positive for sleep disturbance.       Objective:   Physical Exam  This was a telephone encounter only     Assessment & Plan:

## 2019-02-06 NOTE — Assessment & Plan Note (Signed)
This problem is chronic and stable.  She takes Requip every night and is not having any uncontrolled symptoms.  PLAN:  Cont current meds

## 2019-02-12 NOTE — Telephone Encounter (Signed)
Patient did not have a company that she has used. I called [facility]1 [phone] and faxed records to [phone] for Rolling walker,I used this company because of her Insurance.

## 2019-02-13 ENCOUNTER — Telehealth: Payer: Self-pay | Admitting: *Deleted

## 2019-02-13 DIAGNOSIS — G4733 Obstructive sleep apnea (adult) (pediatric): Secondary | ICD-10-CM

## 2019-02-13 NOTE — Telephone Encounter (Addendum)
PCP would like for pt to have home sleep study to reduce risk of COVID19 (pt high risk). Unfortunately all local offices still require pt to come for an initial office visit to explain procedure and get instructions on how to use equipment.  Pt's pcp was informed that pt's needs a visit.  Contacted [facility] to arrange appt-no answer, recording states that staff has been reduced due to [place].  [facility]MA contact number left on recorder.Regenia Skeeter, Darlene Cassady4/8/20203:41 PM       Rafael Bihari [phone] sleep study avail, but an office visit is required first  [facility] [phone]) home study avail, but an office visit is required first

## 2019-02-14 NOTE — Telephone Encounter (Signed)
Vita Barley is returning call to nurse pls contact [phone]

## 2019-02-18 NOTE — Telephone Encounter (Signed)
Call made to patient to make her aware that [facility] will be contacting her regarding a consultation appt for a home sleep study.  Pt had recently stated that she will be moving and her number may have changed, as it is now "not in service".  Spoke with Sheena at [facility] to [facility] will need to be placed (home study in comments).  States she will attempt to contact pt next week with an appointment-also stated that [facility] will be responsible for obtaining  preauthorization (will confirm with [facility] front office staff).  Will place referral and send to pcp for signature.Despina Hidden Cassady4/13/20203:15 PM

## 2019-02-19 NOTE — Telephone Encounter (Signed)
Pt called this morning to update her information for her DME equipment requested.  Information has now been changed an updated.  Pt is unclear if anyone has been trying to reach her in reference to the request.  Please call patient back.

## 2019-02-20 DIAGNOSIS — J441 Chronic obstructive pulmonary disease with (acute) exacerbation: Secondary | ICD-10-CM | POA: Diagnosis not present

## 2019-02-20 DIAGNOSIS — G4733 Obstructive sleep apnea (adult) (pediatric): Secondary | ICD-10-CM | POA: Diagnosis not present

## 2019-02-20 DIAGNOSIS — S82399S Other fracture of lower end of unspecified tibia, sequela: Secondary | ICD-10-CM | POA: Diagnosis not present

## 2019-02-20 DIAGNOSIS — J9611 Chronic respiratory failure with hypoxia: Secondary | ICD-10-CM | POA: Diagnosis not present

## 2019-02-20 NOTE — Telephone Encounter (Signed)
Spoke with patient and made her aware that [facility] will be contacting with an appt.[name] Hidden Cassady4/15/20203:41 PM

## 2019-02-21 NOTE — Telephone Encounter (Signed)
New address and phone number updated to patient chart,I refaxed demographics sheet to [place] and notified the the patient [name] [name] C4/16/20202:14 PM

## 2019-02-27 DIAGNOSIS — R260 Ataxic gait: Secondary | ICD-10-CM | POA: Diagnosis not present

## 2019-02-27 DIAGNOSIS — H8123 Vestibular neuronitis, bilateral: Secondary | ICD-10-CM | POA: Diagnosis not present

## 2019-03-13 ENCOUNTER — Encounter: Payer: Self-pay | Admitting: *Deleted

## 2019-03-20 ENCOUNTER — Telehealth: Payer: Self-pay | Admitting: Internal Medicine

## 2019-03-20 NOTE — Telephone Encounter (Signed)
RTC to pt, she states she cannot come to clinic before end of month appt 5/27 at 1300, dr Heber [place] attending all day.

## 2019-03-20 NOTE — Telephone Encounter (Signed)
Pt states out of #60 tabs she has appr 20 tabs left, she has been using ice and 2 mobic daily. States pain is unchanged. She is now using a cane.

## 2019-03-20 NOTE — Telephone Encounter (Signed)
In person [facility] appt when Dr Heber Hickman or Daryll Drown are attending to consider steroid injection of knee.

## 2019-03-20 NOTE — Telephone Encounter (Signed)
Patient requesting a call back about a stronger pain medication.  Current pain medicine not working.

## 2019-03-22 DIAGNOSIS — J441 Chronic obstructive pulmonary disease with (acute) exacerbation: Secondary | ICD-10-CM | POA: Diagnosis not present

## 2019-03-22 DIAGNOSIS — S82399S Other fracture of lower end of unspecified tibia, sequela: Secondary | ICD-10-CM | POA: Diagnosis not present

## 2019-03-22 DIAGNOSIS — J9611 Chronic respiratory failure with hypoxia: Secondary | ICD-10-CM | POA: Diagnosis not present

## 2019-03-22 DIAGNOSIS — G4733 Obstructive sleep apnea (adult) (pediatric): Secondary | ICD-10-CM | POA: Diagnosis not present

## 2019-04-03 ENCOUNTER — Ambulatory Visit: Payer: Medicare HMO

## 2019-04-08 ENCOUNTER — Other Ambulatory Visit: Payer: Self-pay

## 2019-04-08 ENCOUNTER — Telehealth: Payer: Self-pay | Admitting: Physician Assistant

## 2019-04-08 MED ORDER — ALPRAZOLAM 1 MG PO TABS: 0.5000 mg | ORAL_TABLET | Freq: Two times a day (BID) | ORAL | 5 refills | Status: DC | PRN

## 2019-04-08 NOTE — Telephone Encounter (Signed)
Patient need refill on Xanax to be sent to [facility] on the corner of [address] please send to this pharmacy from here on out.

## 2019-04-08 NOTE — Telephone Encounter (Signed)
Pended for approval.

## 2019-04-10 ENCOUNTER — Telehealth: Payer: Self-pay | Admitting: Physician Assistant

## 2019-04-10 NOTE — Telephone Encounter (Signed)
Ok, now the patient calls today and wants the RX to go to [facility] on [address] instead.  [facility] will not transfer a controlled Rx from one store to another so we will need to send refill to [address] [place] [address]. I told her it won't be today.

## 2019-04-10 NOTE — Telephone Encounter (Signed)
This was submitted to the [facility] on [address] on 04/08/2019

## 2019-04-10 NOTE — Telephone Encounter (Signed)
Please have Xanax prescription sent to the [facility] on [address]. [facility] [address] pt states is closed.

## 2019-04-11 ENCOUNTER — Other Ambulatory Visit: Payer: Self-pay

## 2019-04-11 MED ORDER — ALPRAZOLAM 1 MG PO TABS: 0.5000 mg | ORAL_TABLET | Freq: Two times a day (BID) | ORAL | 5 refills | Status: DC | PRN

## 2019-04-11 NOTE — Telephone Encounter (Signed)
Requesting different pharmacy now, they won't transfer. Last fill 02/07/2019. Refills at [facility] on [address] canceled. Change to [facility] on [address]

## 2019-04-11 NOTE — Telephone Encounter (Signed)
Ok the refills at [facility] have been canceled and called into [facility] on [address].

## 2019-04-18 ENCOUNTER — Other Ambulatory Visit: Payer: Self-pay | Admitting: Internal Medicine

## 2019-04-18 NOTE — Telephone Encounter (Signed)
Pls set up PCP Aug appt  Thanks

## 2019-04-22 DIAGNOSIS — J441 Chronic obstructive pulmonary disease with (acute) exacerbation: Secondary | ICD-10-CM | POA: Diagnosis not present

## 2019-04-22 DIAGNOSIS — G4733 Obstructive sleep apnea (adult) (pediatric): Secondary | ICD-10-CM | POA: Diagnosis not present

## 2019-04-22 DIAGNOSIS — S82399S Other fracture of lower end of unspecified tibia, sequela: Secondary | ICD-10-CM | POA: Diagnosis not present

## 2019-04-22 DIAGNOSIS — J9611 Chronic respiratory failure with hypoxia: Secondary | ICD-10-CM | POA: Diagnosis not present

## 2019-05-06 ENCOUNTER — Other Ambulatory Visit: Payer: Self-pay | Admitting: Internal Medicine

## 2019-05-22 DIAGNOSIS — S82399S Other fracture of lower end of unspecified tibia, sequela: Secondary | ICD-10-CM | POA: Diagnosis not present

## 2019-05-22 DIAGNOSIS — J9611 Chronic respiratory failure with hypoxia: Secondary | ICD-10-CM | POA: Diagnosis not present

## 2019-05-22 DIAGNOSIS — G4733 Obstructive sleep apnea (adult) (pediatric): Secondary | ICD-10-CM | POA: Diagnosis not present

## 2019-05-22 DIAGNOSIS — J441 Chronic obstructive pulmonary disease with (acute) exacerbation: Secondary | ICD-10-CM | POA: Diagnosis not present

## 2019-06-13 ENCOUNTER — Ambulatory Visit (INDEPENDENT_AMBULATORY_CARE_PROVIDER_SITE_OTHER): Payer: Medicare HMO | Admitting: Internal Medicine

## 2019-06-13 ENCOUNTER — Other Ambulatory Visit: Payer: Self-pay

## 2019-06-13 VITALS — BP 129/68 | HR 80 | Temp 98.3°F | Wt 311.1 lb

## 2019-06-13 DIAGNOSIS — R61 Generalized hyperhidrosis: Secondary | ICD-10-CM

## 2019-06-13 DIAGNOSIS — Z0000 Encounter for general adult medical examination without abnormal findings: Secondary | ICD-10-CM

## 2019-06-13 DIAGNOSIS — J3489 Other specified disorders of nose and nasal sinuses: Secondary | ICD-10-CM

## 2019-06-13 DIAGNOSIS — G4761 Periodic limb movement disorder: Secondary | ICD-10-CM

## 2019-06-13 DIAGNOSIS — L299 Pruritus, unspecified: Secondary | ICD-10-CM

## 2019-06-13 DIAGNOSIS — E878 Other disorders of electrolyte and fluid balance, not elsewhere classified: Secondary | ICD-10-CM | POA: Diagnosis not present

## 2019-06-13 DIAGNOSIS — K219 Gastro-esophageal reflux disease without esophagitis: Secondary | ICD-10-CM | POA: Diagnosis not present

## 2019-06-13 DIAGNOSIS — Z7951 Long term (current) use of inhaled steroids: Secondary | ICD-10-CM

## 2019-06-13 DIAGNOSIS — J029 Acute pharyngitis, unspecified: Secondary | ICD-10-CM

## 2019-06-13 DIAGNOSIS — M25561 Pain in right knee: Secondary | ICD-10-CM

## 2019-06-13 DIAGNOSIS — F419 Anxiety disorder, unspecified: Secondary | ICD-10-CM

## 2019-06-13 DIAGNOSIS — R0600 Dyspnea, unspecified: Secondary | ICD-10-CM | POA: Diagnosis not present

## 2019-06-13 DIAGNOSIS — I700 Atherosclerosis of aorta: Secondary | ICD-10-CM | POA: Diagnosis not present

## 2019-06-13 DIAGNOSIS — J449 Chronic obstructive pulmonary disease, unspecified: Secondary | ICD-10-CM | POA: Diagnosis not present

## 2019-06-13 DIAGNOSIS — R21 Rash and other nonspecific skin eruption: Secondary | ICD-10-CM | POA: Diagnosis not present

## 2019-06-13 DIAGNOSIS — Z79899 Other long term (current) drug therapy: Secondary | ICD-10-CM

## 2019-06-13 DIAGNOSIS — F411 Generalized anxiety disorder: Secondary | ICD-10-CM

## 2019-06-13 DIAGNOSIS — F339 Major depressive disorder, recurrent, unspecified: Secondary | ICD-10-CM

## 2019-06-13 DIAGNOSIS — G4733 Obstructive sleep apnea (adult) (pediatric): Secondary | ICD-10-CM | POA: Diagnosis not present

## 2019-06-13 DIAGNOSIS — G4734 Idiopathic sleep related nonobstructive alveolar hypoventilation: Secondary | ICD-10-CM | POA: Insufficient documentation

## 2019-06-13 DIAGNOSIS — Z791 Long term (current) use of non-steroidal anti-inflammatories (NSAID): Secondary | ICD-10-CM

## 2019-06-13 DIAGNOSIS — R42 Dizziness and giddiness: Secondary | ICD-10-CM

## 2019-06-13 MED ORDER — FAMOTIDINE 10 MG PO TABS: 10.0000 mg | ORAL_TABLET | Freq: Two times a day (BID) | ORAL | 3 refills | Status: DC

## 2019-06-13 MED ORDER — ATORVASTATIN CALCIUM 10 MG PO TABS: 10.0000 mg | ORAL_TABLET | Freq: Every day | ORAL | 3 refills | Status: DC

## 2019-06-13 MED ORDER — MELOXICAM 7.5 MG PO TABS: ORAL_TABLET | ORAL | 0 refills | Status: DC

## 2019-06-13 NOTE — Progress Notes (Signed)
   Subjective:    Patient ID: [name] [name], female    DOB: 1956-04-05, 63 y.o.   MRN: [id number]  HPI  Omie Ferger Boltz is here for dyspnea F/U. Please see the A&P for the status of the pt's chronic medical problems.  ROS : per ROS section and in problem oriented charting. All other systems are negative.  PMHx, Soc hx, and / or Fam hx : Her husband moved to [place] in about May.  She is still getting settled in.  Review of Systems  Constitutional: Positive for diaphoresis. Negative for fever and unexpected weight change.  HENT: Positive for sore throat. Negative for congestion.        Positive for 3 weeks of rhinorrhea  Eyes: Positive for itching.  Respiratory: Negative for cough and shortness of breath.        Positive for dyspnea on exertion  Cardiovascular: Negative for chest pain and leg swelling.  Gastrointestinal: Negative for abdominal pain, constipation and diarrhea.  Genitourinary: Negative for dysuria and frequency.  Musculoskeletal: Positive for arthralgias.  Skin: Negative for rash.  Allergic/Immunologic: Negative for environmental allergies and food allergies.  Neurological: Positive for dizziness and light-headedness. Negative for headaches.  Hematological: Bruises/bleeds easily.  Psychiatric/Behavioral: Positive for sleep disturbance.       Objective:   Physical Exam Constitutional:      General: She is not in acute distress.    Appearance: She is well-developed. She is obese. She is not ill-appearing, toxic-appearing or diaphoretic.  HENT:     Head: Normocephalic and atraumatic.     Right Ear: External ear normal.     Left Ear: External ear normal.  Eyes:     General: No scleral icterus.       Right eye: No discharge.        Left eye: No discharge.     Extraocular Movements: Extraocular movements intact.     Conjunctiva/sclera: Conjunctivae normal.  Neck:     Musculoskeletal: Normal range of motion and neck supple.  Cardiovascular:   Rate and Rhythm: Normal rate and regular rhythm.     Heart sounds: Normal heart sounds. No murmur.     Comments: No JVD Trace bilateral lower extremity edema Pulmonary:     Effort: Pulmonary effort is normal. No respiratory distress.     Breath sounds: Normal breath sounds. No stridor. No wheezing or rhonchi.  Abdominal:     General: Bowel sounds are normal.     Palpations: Abdomen is soft.  Musculoskeletal:        General: No swelling, tenderness, deformity or signs of injury.     Comments: No fullness or tenderness left posterior knee  Skin:    General: Skin is warm and dry.     Findings: Bruising present.  Neurological:     General: No focal deficit present.     Mental Status: She is alert. Mental status is at baseline.  Psychiatric:        Mood and Affect: Mood normal.        Behavior: Behavior normal.        Thought Content: Thought content normal.        Judgment: Judgment normal.           Assessment & Plan:

## 2019-06-13 NOTE — Assessment & Plan Note (Signed)
Issues we didn't get to today - diaphoresis of head and face only. Had menopause early 50's resolved but returned now.  To bed 7 or 8 PM bc exhausted. Awakes at 3 AM

## 2019-06-13 NOTE — Assessment & Plan Note (Signed)
This problem is chronic and uncontrolled.  She had a sleep study in 2016 that showed mild sleep apnea.  We have discussed this at her telehealth appointment in May and arranged for a home sleep study but when called to set it up, she declined a sleep study.  Today, she again declines a sleep study.  She states she is not interested in using CPAP but I discussed that there are other options of mild OSA including oral devices and surgical interventions.  She continues to decline.  PLAN : follow

## 2019-06-13 NOTE — Patient Instructions (Signed)
1.  I will mail you your blood test results 2.  I entered the order for COVID testing at the [facility] 3.  I am ordering an echo which is a picture of your current to find out why your oxygen level goes too low 4.  I am going to start a cholesterol medicine called a statin to help with the hardening of the arteries seen on a CT scan 5.  I will send in a stomach medicine along with refills of your medications later today.

## 2019-06-13 NOTE — Assessment & Plan Note (Signed)
This problem is chronic and well controlled on her Requip.  I had refilled her Requip recently for she asked plenty of refills.  PLAN:  Cont current meds

## 2019-06-13 NOTE — Assessment & Plan Note (Signed)
This problem is chronic and currently untreated.  She is a non-smoker.  She has no hypertension or diabetes.  I discussed a statin and she was interested in starting a statin.  Her 10-year cardiovascular risk is about 7% so guidelines recommend a moderate intensity statin so I prescribed atorvastatin 10 mg daily.  This is for primary prevention.  PLAN : atorvastatin 10 QD

## 2019-06-13 NOTE — Assessment & Plan Note (Addendum)
This problem is chronic and uncontrolled.  Her hypoxia is self-reported as she has a home pulse ox.  She states that the longer she lays in bed in the morning when she wakes up at 3 AM, the lower her oxygen goes.  It drops down into the 70s and she feels tired and sleepy.  She states that she sits up, her oxygen normalizes.  She also states that if she wakes up in the middle the night and checks her oxygen, it will be in the 80s.  Her lungs are clear and she has actually lost weight from 340 lbs 2 years ago to 311 today.  I do not think she has pulmonary edema but she could have a cardiomyopathy so I am starting with an echo and blood work today.  I am also worried about OSA that has worsened since her last sleep study but she declined a repeat sleep study. OHS is also a possibility. A faulty O2 sat monitor is also a possibility.  PLAN - ECHO   ECHO grossly nl but low quality. Sent letter stressing need for sleep study

## 2019-06-13 NOTE — Assessment & Plan Note (Signed)
This problem is chronic and improved.  She states the rash on her arm and leg are gone and that she is not interested in pursuing dermatology referral.  She states she bruises easily.  PLAN : follow

## 2019-06-13 NOTE — Assessment & Plan Note (Signed)
She continues to work with a Licensed conveyancer to treat both her anxiety and depression and remains on Zoloft, trazodone, and Xanax all of which are prescribed by a mental health provider.

## 2019-06-13 NOTE — Assessment & Plan Note (Signed)
This problem is chronic and uncontrolled. She used to be on ranitidine with great results. Having daily sxs. Sxs started with spicy foods, eliminated those, them with greasy food, eliminated those, now almost with anything. Only tolerating cereal, soup, jello. Tums and Rolaids not helping. Assoc with vomiting. Describes burning retrosternally. We will try a different H2B. If no better, will increase H2B dose of move to PPi. Has been on Protonix yres ago.  PLAN : Famotidine 10 BID

## 2019-06-13 NOTE — Assessment & Plan Note (Signed)
This problem is chronic and stable. She states she only has 1 inhaler which is Dulera.  She is no longer working with Dr. Melvyn Novas of pulmonology.  She is a lifelong non-smoker and had PFTs in the past.  We spent all of our time on other issues so we will need to address this at a future appointment.  PLAN : follow

## 2019-06-13 NOTE — Assessment & Plan Note (Signed)
This problem is chronic and improved.  It is in the posterior left knee and is worse with standing or walking.  She uses an over-the-counter lotion and Mobic about 3 times a week.  Her exam did not show any abnormalities. I discussed my differential including OA and a Baker's cyst but she was not interested in pursuing a plain film or a Doppler today.  She requested a refill of her Mobic and I am getting a BMP today to make sure her renal function is stable.  PLAN : BMP for renal fxn Mobic PRN

## 2019-06-13 NOTE — Assessment & Plan Note (Addendum)
This problem is chronic and stable.  She first reported this to me in May in our telehealth appointment.  It has been going on about a year.  Today, she states it is only when she gets up in the morning.  If she sits on the side of the bed for about 5 minutes then she has no difficulty.  Occasionally, if she gets up too quickly from a seated position later in the day, she may get dizzy.  She did get a walker and uses it around the house but did not bring it today as she used a wheelchair to get from the parking lot to our clinic.  After I got additional detail today, I do not feel that a posterior circulation issue is causing this nor a vestibular issue.  I think this is simply possibly orthostatic hypotension from autonomic dysfunction and I am getting orthostatics today.  She has already made changes to standing up to ensure her safety.  She declined home PT referral.  PLAN : follow  Orthostatics were negative

## 2019-06-14 ENCOUNTER — Encounter: Payer: Self-pay | Admitting: Internal Medicine

## 2019-06-14 DIAGNOSIS — D582 Other hemoglobinopathies: Secondary | ICD-10-CM

## 2019-06-14 LAB — CMP14 + ANION GAP
ALT: 8 IU/L (ref 0–32)
AST: 13 IU/L (ref 0–40)
Albumin/Globulin Ratio: 2 (ref 1.2–2.2)
Albumin: 4.3 g/dL (ref 3.8–4.8)
Alkaline Phosphatase: 129 IU/L — ABNORMAL HIGH (ref 39–117)
Anion Gap: 18 mmol/L (ref 10.0–18.0)
BUN/Creatinine Ratio: 11 — ABNORMAL LOW (ref 12–28)
BUN: 8 mg/dL (ref 8–27)
Bilirubin Total: 0.3 mg/dL (ref 0.0–1.2)
CO2: 23 mmol/L (ref 20–29)
Calcium: 9.2 mg/dL (ref 8.7–10.3)
Chloride: 99 mmol/L (ref 96–106)
Creatinine, Ser: 0.71 mg/dL (ref 0.57–1.00)
GFR calc Af Amer: 105 mL/min/{1.73_m2} (ref >59)
GFR calc non Af Amer: 91 mL/min/{1.73_m2} (ref >59)
Globulin, Total: 2.1 g/dL (ref 1.5–4.5)
Glucose: 101 mg/dL — ABNORMAL HIGH (ref 65–99)
Potassium: 4.4 mmol/L (ref 3.5–5.2)
Sodium: 140 mmol/L (ref 134–144)
Total Protein: 6.4 g/dL (ref 6.0–8.5)

## 2019-06-14 LAB — CBC WITH DIFFERENTIAL/PLATELET
Basophils Absolute: 0 10*3/uL (ref 0.0–0.2)
Basos: 1 %
EOS (ABSOLUTE): 0.1 10*3/uL (ref 0.0–0.4)
Eos: 1 %
Hematocrit: 48.2 % — ABNORMAL HIGH (ref 34.0–46.6)
Hemoglobin: 16.4 g/dL — ABNORMAL HIGH (ref 11.1–15.9)
Immature Grans (Abs): 0 10*3/uL (ref 0.0–0.1)
Immature Granulocytes: 0 %
Lymphocytes Absolute: 1.5 10*3/uL (ref 0.7–3.1)
Lymphs: 20 %
MCH: 29.4 pg (ref 26.6–33.0)
MCHC: 34 g/dL (ref 31.5–35.7)
MCV: 87 fL (ref 79–97)
Monocytes Absolute: 0.5 10*3/uL (ref 0.1–0.9)
Monocytes: 6 %
Neutrophils Absolute: 5.2 10*3/uL (ref 1.4–7.0)
Neutrophils: 72 %
Platelets: 167 10*3/uL (ref 150–450)
RBC: 5.57 x10E6/uL — ABNORMAL HIGH (ref 3.77–5.28)
RDW: 14 % (ref 11.7–15.4)
WBC: 7.3 10*3/uL (ref 3.4–10.8)

## 2019-06-14 LAB — TSH: TSH: 3.16 u[IU]/mL (ref 0.450–4.500)

## 2019-06-17 ENCOUNTER — Other Ambulatory Visit: Payer: Self-pay

## 2019-06-17 DIAGNOSIS — Z20822 Contact with and (suspected) exposure to covid-19: Secondary | ICD-10-CM

## 2019-06-18 LAB — NOVEL CORONAVIRUS, NAA: SARS-CoV-2, NAA: NOT DETECTED

## 2019-06-19 ENCOUNTER — Other Ambulatory Visit: Payer: Self-pay

## 2019-06-19 ENCOUNTER — Telehealth: Payer: Self-pay | Admitting: Internal Medicine

## 2019-06-19 NOTE — Telephone Encounter (Signed)
Pt aware covid lab test negative, not detected °

## 2019-06-19 NOTE — Telephone Encounter (Signed)
Pt [facility] on her profile, she made no mention which she wanted to use, triage ask her if we could get [facility] down to [facility] and she was agreeable. [facility] [address]. She will have script transferred

## 2019-06-19 NOTE — Telephone Encounter (Signed)
Pt states she still have not received the stomach med. Please call pt back.

## 2019-06-20 ENCOUNTER — Ambulatory Visit (HOSPITAL_COMMUNITY)
Admission: RE | Admit: 2019-06-20 | Discharge: 2019-06-20 | Disposition: A | Discharge status: Home / Self Care | Payer: Medicare HMO | Source: Ambulatory Visit | Attending: Internal Medicine | Admitting: Internal Medicine

## 2019-06-20 ENCOUNTER — Other Ambulatory Visit: Payer: Self-pay

## 2019-06-20 DIAGNOSIS — I700 Atherosclerosis of aorta: Secondary | ICD-10-CM | POA: Diagnosis not present

## 2019-06-20 DIAGNOSIS — R0600 Dyspnea, unspecified: Secondary | ICD-10-CM | POA: Insufficient documentation

## 2019-06-20 DIAGNOSIS — G4733 Obstructive sleep apnea (adult) (pediatric): Secondary | ICD-10-CM | POA: Insufficient documentation

## 2019-06-20 DIAGNOSIS — G4734 Idiopathic sleep related nonobstructive alveolar hypoventilation: Secondary | ICD-10-CM | POA: Insufficient documentation

## 2019-06-20 NOTE — Progress Notes (Signed)
  Echocardiogram 2D Echocardiogram has been performed.  [name] [name] 06/20/2019, 10:48 AM

## 2019-06-22 DIAGNOSIS — G4733 Obstructive sleep apnea (adult) (pediatric): Secondary | ICD-10-CM | POA: Diagnosis not present

## 2019-06-22 DIAGNOSIS — J9611 Chronic respiratory failure with hypoxia: Secondary | ICD-10-CM | POA: Diagnosis not present

## 2019-06-22 DIAGNOSIS — J441 Chronic obstructive pulmonary disease with (acute) exacerbation: Secondary | ICD-10-CM | POA: Diagnosis not present

## 2019-06-22 DIAGNOSIS — S82399S Other fracture of lower end of unspecified tibia, sequela: Secondary | ICD-10-CM | POA: Diagnosis not present

## 2019-07-10 ENCOUNTER — Telehealth: Payer: Self-pay | Admitting: *Deleted

## 2019-07-10 ENCOUNTER — Other Ambulatory Visit: Payer: Self-pay

## 2019-07-10 ENCOUNTER — Other Ambulatory Visit (INDEPENDENT_AMBULATORY_CARE_PROVIDER_SITE_OTHER): Payer: Medicare HMO

## 2019-07-10 DIAGNOSIS — D582 Other hemoglobinopathies: Secondary | ICD-10-CM | POA: Diagnosis not present

## 2019-07-10 NOTE — Telephone Encounter (Signed)
Pt wants a card to show people that she does not have to wear a mask when out in public, states she cannot breathe with a mask and a friend told her you would give her a card that states that and then she can show people in public

## 2019-07-11 ENCOUNTER — Ambulatory Visit: Payer: Medicare HMO | Admitting: Physician Assistant

## 2019-07-16 NOTE — Telephone Encounter (Signed)
Tried to rtc to pt to give her dr butcher's message, no answer, lm for rtc

## 2019-07-16 NOTE — Telephone Encounter (Signed)
No. Sorry I cannot provide that bc no reason that a cloth or surgical mask would cause medical shortness of breath

## 2019-07-17 ENCOUNTER — Telehealth: Payer: Self-pay | Admitting: Internal Medicine

## 2019-07-17 NOTE — Telephone Encounter (Signed)
Called pt and gave her dr butcher's answer about masks, she was agreeable

## 2019-07-17 NOTE — Telephone Encounter (Signed)
Spoke w/ pt passed along message she was agreeable

## 2019-07-17 NOTE — Telephone Encounter (Signed)
Pt missed a call, pt is wondering if someone was calling regarding her results [phone]

## 2019-07-19 LAB — CALR+JAK2 E12-15+MPL

## 2019-07-19 LAB — JAK2 V617F, W REFLEX TO CALR/E12/MPL

## 2019-07-19 LAB — ERYTHROPOIETIN: Erythropoietin: 13.3 m[IU]/mL (ref 2.6–18.5)

## 2019-07-23 DIAGNOSIS — J9611 Chronic respiratory failure with hypoxia: Secondary | ICD-10-CM | POA: Diagnosis not present

## 2019-07-23 DIAGNOSIS — J441 Chronic obstructive pulmonary disease with (acute) exacerbation: Secondary | ICD-10-CM | POA: Diagnosis not present

## 2019-07-23 DIAGNOSIS — S82399S Other fracture of lower end of unspecified tibia, sequela: Secondary | ICD-10-CM | POA: Diagnosis not present

## 2019-07-23 DIAGNOSIS — G4733 Obstructive sleep apnea (adult) (pediatric): Secondary | ICD-10-CM | POA: Diagnosis not present

## 2019-07-29 ENCOUNTER — Telehealth: Payer: Self-pay | Admitting: Physician Assistant

## 2019-07-29 ENCOUNTER — Other Ambulatory Visit: Payer: Self-pay

## 2019-07-29 MED ORDER — SERTRALINE HCL 100 MG PO TABS: 250.0000 mg | ORAL_TABLET | Freq: Every day | ORAL | 0 refills | Status: DC

## 2019-07-29 NOTE — Telephone Encounter (Signed)
Pt requested refill for sertraline @ [facility] [address]

## 2019-07-29 NOTE — Telephone Encounter (Signed)
Refill submitted has appt 08/09/2019

## 2019-07-31 ENCOUNTER — Encounter: Payer: Self-pay | Admitting: Internal Medicine

## 2019-08-09 ENCOUNTER — Ambulatory Visit: Payer: Medicare HMO | Admitting: Physician Assistant

## 2019-08-22 DIAGNOSIS — G4733 Obstructive sleep apnea (adult) (pediatric): Secondary | ICD-10-CM | POA: Diagnosis not present

## 2019-08-22 DIAGNOSIS — J9611 Chronic respiratory failure with hypoxia: Secondary | ICD-10-CM | POA: Diagnosis not present

## 2019-08-22 DIAGNOSIS — S82399S Other fracture of lower end of unspecified tibia, sequela: Secondary | ICD-10-CM | POA: Diagnosis not present

## 2019-08-22 DIAGNOSIS — J441 Chronic obstructive pulmonary disease with (acute) exacerbation: Secondary | ICD-10-CM | POA: Diagnosis not present

## 2019-08-30 ENCOUNTER — Encounter: Payer: Self-pay | Admitting: Physician Assistant

## 2019-08-30 ENCOUNTER — Ambulatory Visit (INDEPENDENT_AMBULATORY_CARE_PROVIDER_SITE_OTHER): Payer: Medicare HMO | Admitting: Physician Assistant

## 2019-08-30 ENCOUNTER — Other Ambulatory Visit: Payer: Self-pay

## 2019-08-30 DIAGNOSIS — F411 Generalized anxiety disorder: Secondary | ICD-10-CM | POA: Diagnosis not present

## 2019-08-30 DIAGNOSIS — R454 Irritability and anger: Secondary | ICD-10-CM | POA: Diagnosis not present

## 2019-08-30 DIAGNOSIS — G4700 Insomnia, unspecified: Secondary | ICD-10-CM | POA: Diagnosis not present

## 2019-08-30 DIAGNOSIS — F429 Obsessive-compulsive disorder, unspecified: Secondary | ICD-10-CM

## 2019-08-30 DIAGNOSIS — F39 Unspecified mood [affective] disorder: Secondary | ICD-10-CM | POA: Diagnosis not present

## 2019-08-30 MED ORDER — ROPINIROLE HCL 0.5 MG PO TABS: 0.5000 mg | ORAL_TABLET | Freq: Every day | ORAL | 1 refills | Status: DC

## 2019-08-30 MED ORDER — SERTRALINE HCL 100 MG PO TABS: 300.0000 mg | ORAL_TABLET | Freq: Every day | ORAL | 1 refills | Status: DC

## 2019-08-30 NOTE — Progress Notes (Signed)
[facility]  Patient ID: [name] [name],  MRN: [id number]  PCP: Bartholomew Crews, MD  Date of Evaluation: 08/30/2019 Time spent:15 minutes  Chief Complaint:  Chief Complaint    Anxiety; Medication Refill      HISTORY/CURRENT STATUS: HPI For routine med check.  Overall is doing well. Anxiety is controlled with Xanax. Uses it a lot at night to help her relax at night to go to sleep.   Patient denies loss of interest in usual activities and is able to enjoy things.  Denies decreased energy or motivation.  Appetite has not changed.  No extreme sadness, tearfulness, or feelings of hopelessness.  Denies any changes in concentration, making decisions or remembering things.  Denies suicidal or homicidal thoughts.  The only complaint she has is more irritability sometimes. No trigger. Started about 2 months ago. "I wake up mad and go to bed mad.  My husband asked me yesterday what was wrong. I want to be left alone. I don't feel depressed but I don't want people to come over.  When I go to church, I can't wait to get home.  I get real nervous when I'm out and about." Patient denies increased energy with decreased need for sleep, no increased talkativeness, no racing thoughts, no impulsivity or risky behaviors, no increased spending, no increased libido, no grandiosity.  Denies dizziness, syncope, seizures, numbness, tingling, tremor, tics, unsteady gait, slurred speech, confusion. Denies muscle or joint pain, stiffness, or dystonia.  Individual Medical History/ Review of Systems: Changes? :No    Past medications for mental health diagnoses include: Wellbutrin, Xanax, Zoloft, trazodone, gabapentin, BuSpar, Ativan, Klonopin  Allergies: Ibuprofen and Clonazepam  Current Medications:  Current Outpatient Medications:  .  acetaminophen (TYLENOL) 500 MG tablet, Take 1,000 mg by mouth every 6 (six) hours as needed (for pain OR back pain). , Disp: , Rfl:  .  albuterol  (PROVENTIL HFA;VENTOLIN HFA) 108 (90 Base) MCG/ACT inhaler, Inhale 2 puffs into the lungs every 6 (six) hours as needed for wheezing or shortness of breath., Disp: 1 Inhaler, Rfl: 2 .  ALPRAZolam (XANAX) 1 MG tablet, Take 0.5-1 tablets (0.5-1 mg total) by mouth 2 (two) times daily as needed for anxiety., Disp: 60 tablet, Rfl: 5 .  atorvastatin (LIPITOR) 10 MG tablet, Take 1 tablet (10 mg total) by mouth daily., Disp: 90 tablet, Rfl: 3 .  famotidine (PEPCID) 10 MG tablet, Take 1 tablet (10 mg total) by mouth 2 (two) times daily. May increase to 2 pills twice a day if no improvement after 2 weeks, Disp: 60 tablet, Rfl: 3 .  meloxicam (MOBIC) 7.5 MG tablet, TAKE 1 TABLET(7.5 MG) BY MOUTH DAILY AS NEEDED, Disp: 90 tablet, Rfl: 0 .  nystatin (MYCOSTATIN/NYSTOP) 100000 UNIT/GM POWD, Please apply to skin under breasts twice daily until rash resolves., Disp: 30 g, Rfl: 1 .  rOPINIRole (REQUIP) 0.25 MG tablet, TAKE 1 TABLET(0.25 MG) BY MOUTH AT BEDTIME, Disp: 90 tablet, Rfl: 3 .  traZODone (DESYREL) 150 MG tablet, Take 1 tablet (150 mg total) by mouth at bedtime as needed for sleep. (Patient taking differently: Take 150 mg by mouth at bedtime. ), Disp: 90 tablet, Rfl: 3 .  fluticasone furoate-vilanterol (BREO ELLIPTA) 100-25 MCG/INH AEPB, Inhale 1 puff into the lungs daily. (Patient not taking: Reported on 01/08/2019), Disp: 1 each, Rfl: 5 .  rOPINIRole (REQUIP) 0.5 MG tablet, Take 1 tablet (0.5 mg total) by mouth at bedtime., Disp: 90 tablet, Rfl: 1 .  sertraline (ZOLOFT) 100 MG  tablet, Take 3 tablets (300 mg total) by mouth daily., Disp: 270 tablet, Rfl: 1 Medication Side Effects: none  Family Medical/ Social History: Changes? No  MENTAL HEALTH EXAM:  There were no vitals taken for this visit.There is no height or weight on file to calculate BMI.  General Appearance: Casual, Neat, Well Groomed and Obese  Eye Contact:  Good  Speech:  Clear and Coherent  Volume:  Normal  Mood:  Euthymic  Affect:   Appropriate  Thought Process:  Goal Directed  Orientation:  Full (Time, Place, and Person)  Thought Content: Logical   Suicidal Thoughts:  No  Homicidal Thoughts:  No  Memory:  WNL  Judgement:  Good  Insight:  Good  Psychomotor Activity:  Normal  Concentration:  Concentration: Good  Recall:  Good  Fund of Knowledge: Good  Language: Good  Assets:  Desire for Improvement  ADL's:  Intact  Cognition: WNL  Prognosis:  Good    DIAGNOSES:    ICD-10-CM   1. Episodic mood disorder (HCC)  F39   2. Generalized anxiety disorder  F41.1   3. Obsessive-compulsive disorder, unspecified type  F42.9   4. Insomnia, unspecified type  G47.00   5. Irritability and anger  R45.4     Receiving Psychotherapy: No    RECOMMENDATIONS:  We discussed either adding Trileptal or Depakote but we would both prefer that we try to manage the anger and irritability with the medication she is already on if at all possible. Increase Zoloft to 300 mg p.o. daily. Continue Xanax 1 mg 1/2-1 twice daily as needed.  I have encouraged her to use a whole one during the day if she knows she is going to be in an anxiety provoking situation. Increase ropinirole to 0.5 mg nightly. Continue trazodone 150 mg nightly as needed. Return in 6 weeks.  Donnal Moat, PA-C

## 2019-09-22 DIAGNOSIS — G4733 Obstructive sleep apnea (adult) (pediatric): Secondary | ICD-10-CM | POA: Diagnosis not present

## 2019-09-22 DIAGNOSIS — J441 Chronic obstructive pulmonary disease with (acute) exacerbation: Secondary | ICD-10-CM | POA: Diagnosis not present

## 2019-09-22 DIAGNOSIS — S82399S Other fracture of lower end of unspecified tibia, sequela: Secondary | ICD-10-CM | POA: Diagnosis not present

## 2019-09-22 DIAGNOSIS — J9611 Chronic respiratory failure with hypoxia: Secondary | ICD-10-CM | POA: Diagnosis not present

## 2019-09-25 ENCOUNTER — Encounter: Payer: Self-pay | Admitting: Internal Medicine

## 2019-09-26 ENCOUNTER — Encounter: Payer: Medicare HMO | Admitting: Internal Medicine

## 2019-09-30 ENCOUNTER — Other Ambulatory Visit: Payer: Self-pay

## 2019-09-30 DIAGNOSIS — Z20822 Contact with and (suspected) exposure to covid-19: Secondary | ICD-10-CM

## 2019-10-02 LAB — NOVEL CORONAVIRUS, NAA: SARS-CoV-2, NAA: NOT DETECTED

## 2019-10-07 ENCOUNTER — Telehealth: Payer: Self-pay | Admitting: Internal Medicine

## 2019-10-07 NOTE — Telephone Encounter (Signed)
Called pt with results of COVID testing, call ended

## 2019-10-07 NOTE — Telephone Encounter (Signed)
Pt is calling regarding results [phone]

## 2019-10-11 ENCOUNTER — Ambulatory Visit: Payer: Medicare HMO | Admitting: Physician Assistant

## 2019-10-26 ENCOUNTER — Other Ambulatory Visit: Payer: Self-pay | Admitting: Physician Assistant

## 2019-10-31 ENCOUNTER — Other Ambulatory Visit: Payer: Self-pay | Admitting: Internal Medicine

## 2019-11-05 ENCOUNTER — Other Ambulatory Visit: Payer: Self-pay | Admitting: Physician Assistant

## 2019-11-05 NOTE — Telephone Encounter (Signed)
Apt 11/15/2019

## 2019-11-15 ENCOUNTER — Encounter: Payer: Self-pay | Admitting: Physician Assistant

## 2019-11-15 ENCOUNTER — Ambulatory Visit (INDEPENDENT_AMBULATORY_CARE_PROVIDER_SITE_OTHER): Payer: Medicare HMO | Admitting: Physician Assistant

## 2019-11-15 DIAGNOSIS — F411 Generalized anxiety disorder: Secondary | ICD-10-CM | POA: Diagnosis not present

## 2019-11-15 DIAGNOSIS — G4700 Insomnia, unspecified: Secondary | ICD-10-CM

## 2019-11-15 DIAGNOSIS — F39 Unspecified mood [affective] disorder: Secondary | ICD-10-CM | POA: Diagnosis not present

## 2019-11-15 DIAGNOSIS — F429 Obsessive-compulsive disorder, unspecified: Secondary | ICD-10-CM

## 2019-11-15 MED ORDER — TRAZODONE HCL 150 MG PO TABS: 150.0000 mg | ORAL_TABLET | Freq: Every evening | ORAL | 3 refills | Status: DC | PRN

## 2019-11-15 MED ORDER — SERTRALINE HCL 100 MG PO TABS: 300.0000 mg | ORAL_TABLET | Freq: Every day | ORAL | 1 refills | Status: DC

## 2019-11-15 NOTE — Progress Notes (Signed)
[facility]  Patient ID: [name] [name],  MRN: [id number]  PCP: Bartholomew Crews, MD  Date of Evaluation: 11/15/2019 Time spent:20 minutes  Chief Complaint:  Chief Complaint    Anxiety; Depression; Insomnia; Follow-up     Virtual Visit via Telephone Note  I connected with patient by a video enabled telemedicine application or telephone, with their informed consent, and verified patient privacy and that I am speaking with the correct person using two identifiers.  I am private, in my office and the patient is home.  I discussed the limitations, risks, security and privacy concerns of performing an evaluation and management service by telephone and the availability of in person appointments. I also discussed with the patient that there may be a patient responsible charge related to this service. The patient expressed understanding and agreed to proceed.   I discussed the assessment and treatment plan with the patient. The patient was provided an opportunity to ask questions and all were answered. The patient agreed with the plan and demonstrated an understanding of the instructions.   The patient was advised to call back or seek an in-person evaluation if the symptoms worsen or if the condition fails to improve as anticipated.  I provided 20 minutes of non-face-to-face time during this encounter.  HISTORY/CURRENT STATUS: HPI For routine med check.   Doing better since we increased the Zoloft.  She is not nearly as depressed or anxious.  She still has anxiety especially with riding in the car.  "I do not think that will ever get any better."  The Xanax really does help when she needs it.  She has generalized anxiety, not only panic attacks.  The panic attacks are usually triggered by an event, such as having to travel somewhere.  The generalized anxiety is more of an ongoing sense of unease.  Patient denies loss of interest in usual activities and is able to enjoy  things.  Denies decreased energy or motivation.  Appetite has not changed.  No extreme sadness, tearfulness, or feelings of hopelessness.  Denies any changes in concentration, making decisions or remembering things.  Denies suicidal or homicidal thoughts.  Patient denies increased energy with decreased need for sleep, no increased talkativeness, no racing thoughts, no impulsivity or risky behaviors, no increased spending, no increased libido, no grandiosity.  She sleeps well with the trazodone.  She does not work.  Denies dizziness, syncope, seizures, numbness, tingling, tremor, tics, unsteady gait, slurred speech, confusion. Denies muscle or joint pain, stiffness, or dystonia.  Individual Medical History/ Review of Systems: Changes? :No    Past medications for mental health diagnoses include: Wellbutrin, Xanax, Zoloft, trazodone, gabapentin, BuSpar, Ativan, Klonopin  Allergies: Ibuprofen and Clonazepam  Current Medications:  Current Outpatient Medications:  .  acetaminophen (TYLENOL) 500 MG tablet, Take 1,000 mg by mouth every 6 (six) hours as needed (for pain OR back pain). , Disp: , Rfl:  .  albuterol (PROVENTIL HFA;VENTOLIN HFA) 108 (90 Base) MCG/ACT inhaler, Inhale 2 puffs into the lungs every 6 (six) hours as needed for wheezing or shortness of breath., Disp: 1 Inhaler, Rfl: 2 .  ALPRAZolam (XANAX) 1 MG tablet, TAKE 1/2 TO 1 TABLET BY MOUTH TWICE DAILY AS NEEDED FOR ANXIETY, Disp: 60 tablet, Rfl: 1 .  atorvastatin (LIPITOR) 10 MG tablet, Take 1 tablet (10 mg total) by mouth daily., Disp: 90 tablet, Rfl: 3 .  famotidine (PEPCID) 10 MG tablet, Take 1 tablet (10 mg total) by mouth 2 (two) times daily. May  increase to 2 pills twice a day if no improvement after 2 weeks, Disp: 60 tablet, Rfl: 3 .  meloxicam (MOBIC) 7.5 MG tablet, TAKE 1 TABLET(7.5 MG) BY MOUTH DAILY AS NEEDED, Disp: 90 tablet, Rfl: 0 .  nystatin (MYCOSTATIN/NYSTOP) 100000 UNIT/GM POWD, Please apply to skin under breasts twice  daily until rash resolves., Disp: 30 g, Rfl: 1 .  rOPINIRole (REQUIP) 0.5 MG tablet, Take 1 tablet (0.5 mg total) by mouth at bedtime., Disp: 90 tablet, Rfl: 1 .  sertraline (ZOLOFT) 100 MG tablet, Take 3 tablets (300 mg total) by mouth daily., Disp: 270 tablet, Rfl: 1 .  traZODone (DESYREL) 150 MG tablet, Take 1 tablet (150 mg total) by mouth at bedtime as needed for sleep., Disp: 90 tablet, Rfl: 3 .  fluticasone furoate-vilanterol (BREO ELLIPTA) 100-25 MCG/INH AEPB, Inhale 1 puff into the lungs daily. (Patient not taking: Reported on 01/08/2019), Disp: 1 each, Rfl: 5 Medication Side Effects: none  Family Medical/ Social History: Changes? No  MENTAL HEALTH EXAM:  There were no vitals taken for this visit.There is no height or weight on file to calculate BMI.  General Appearance: unable to assess  Eye Contact:  unable to assess  Speech:  Clear and Coherent  Volume:  Normal  Mood:  Euthymic  Affect:  unable to assess  Thought Process:  Goal Directed and Descriptions of Associations: Intact  Orientation:  Full (Time, Place, and Person)  Thought Content: Logical   Suicidal Thoughts:  No  Homicidal Thoughts:  No  Memory:  WNL  Judgement:  Good  Insight:  Good  Psychomotor Activity:  unable to assess  Concentration:  Concentration: Good  Recall:  Good  Fund of Knowledge: Good  Language: Good  Assets:  Desire for Improvement  ADL's:  Intact  Cognition: WNL  Prognosis:  Good    DIAGNOSES:    ICD-10-CM   1. Episodic mood disorder (HCC)  F39   2. Obsessive-compulsive disorder, unspecified type  F42.9   3. Generalized anxiety disorder  F41.1   4. Insomnia, unspecified type  G47.00     Receiving Psychotherapy: No    RECOMMENDATIONS:  I am glad she is doing better! Continue Xanax 1 mg, 1/2-1 twice daily as needed. Continue ropinirole 0.5 mg nightly. Continue Zoloft 100 mg, 3 p.o. daily. Continue trazodone 150 mg, 1 p.o. nightly as needed sleep. Return in 6 months.  Donnal Moat, PA-C

## 2019-11-29 ENCOUNTER — Telehealth: Payer: Self-pay

## 2019-11-29 NOTE — Telephone Encounter (Signed)
Prior authorization submitted for quantity limit for Sertraline 100 mg take 3 tablets daily #270 for 90 day supply approved effective 11/08/2019-11/06/2020 With [facility] Medicare ID# [id number] PA# [id number]

## 2019-12-06 DIAGNOSIS — M25562 Pain in left knee: Secondary | ICD-10-CM | POA: Diagnosis not present

## 2019-12-06 DIAGNOSIS — M545 Low back pain: Secondary | ICD-10-CM | POA: Diagnosis not present

## 2019-12-06 DIAGNOSIS — Z6841 Body Mass Index (BMI) 40.0 and over, adult: Secondary | ICD-10-CM | POA: Diagnosis not present

## 2019-12-16 ENCOUNTER — Telehealth: Payer: Self-pay | Admitting: Physician Assistant

## 2019-12-16 NOTE — Telephone Encounter (Signed)
Informed patient I had already submitted a PA for her to take 3 tablets of Sertraline daily and it's approved. She said she has enough pills left for 2 weeks, she thinks she lost a bottle of her 90 day supply, says she's looked everywhere. Will contact her pharmacy and check last refill.

## 2019-12-16 NOTE — Telephone Encounter (Signed)
Pt almost out of her sertraline. Pt sent in paperwork from insurance that was sent to her for Helene Kelp to look at. She wants to know what should she do since she can't afford to pay out of pocket.

## 2019-12-23 NOTE — Telephone Encounter (Signed)
Last refill #90 on 12/18/2019 for Sertraline 100 mg 3/day

## 2020-02-17 ENCOUNTER — Other Ambulatory Visit: Payer: Self-pay | Admitting: Internal Medicine

## 2020-02-17 NOTE — Telephone Encounter (Signed)
Needs appt by June

## 2020-02-18 ENCOUNTER — Other Ambulatory Visit: Payer: Self-pay | Admitting: Physician Assistant

## 2020-02-18 NOTE — Telephone Encounter (Signed)
Next apt 07/09

## 2020-03-02 ENCOUNTER — Other Ambulatory Visit: Payer: Self-pay | Admitting: Physician Assistant

## 2020-04-09 ENCOUNTER — Encounter: Payer: Medicare HMO | Admitting: Internal Medicine

## 2020-04-16 DIAGNOSIS — Z0100 Encounter for examination of eyes and vision without abnormal findings: Secondary | ICD-10-CM | POA: Diagnosis not present

## 2020-04-16 DIAGNOSIS — H524 Presbyopia: Secondary | ICD-10-CM | POA: Diagnosis not present

## 2020-04-17 DIAGNOSIS — M25562 Pain in left knee: Secondary | ICD-10-CM | POA: Diagnosis not present

## 2020-04-27 ENCOUNTER — Encounter: Payer: Self-pay | Admitting: *Deleted

## 2020-05-15 ENCOUNTER — Encounter: Payer: Self-pay | Admitting: Physician Assistant

## 2020-05-15 ENCOUNTER — Other Ambulatory Visit: Payer: Self-pay

## 2020-05-15 ENCOUNTER — Ambulatory Visit (INDEPENDENT_AMBULATORY_CARE_PROVIDER_SITE_OTHER): Payer: Medicare HMO | Admitting: Physician Assistant

## 2020-05-15 DIAGNOSIS — F411 Generalized anxiety disorder: Secondary | ICD-10-CM

## 2020-05-15 DIAGNOSIS — G4700 Insomnia, unspecified: Secondary | ICD-10-CM | POA: Diagnosis not present

## 2020-05-15 DIAGNOSIS — F39 Unspecified mood [affective] disorder: Secondary | ICD-10-CM

## 2020-05-15 MED ORDER — ALPRAZOLAM 1 MG PO TABS: 0.5000 mg | ORAL_TABLET | Freq: Two times a day (BID) | ORAL | 5 refills | Status: DC | PRN

## 2020-05-15 MED ORDER — SERTRALINE HCL 100 MG PO TABS: 300.0000 mg | ORAL_TABLET | Freq: Every day | ORAL | 1 refills | Status: DC

## 2020-05-15 NOTE — Progress Notes (Signed)
[facility]  Patient ID: [name] [name],  MRN: [id number]  PCP: Iona Beard, MD  Date of Evaluation: 05/15/2020 Time spent:20 minutes  Chief Complaint:  Chief Complaint    Anxiety; Depression; Insomnia      HISTORY/CURRENT STATUS: HPI For routine med check.   Doing well. Feels like the meds are working as well as they possibly can.  "I think I am always going to have panic attacks."  They are still worse when she rides in the car.  She recently went to the beach with her son and his family and being in the car for that long of a ride caused a lot of anxiety.  But she did have a good time at the beach.    Patient denies loss of interest in usual activities and is able to enjoy things.  Denies decreased energy or motivation. She is purposely losing weight, on the Atkins diet.  Denies any changes in concentration, making decisions or remembering things. She sleeps well with the trazodone.  States another provider stopped the ropinirole, she does not need it anymore.  She does not work. Denies suicidal or homicidal thoughts.  Patient denies increased energy with decreased need for sleep, no increased talkativeness, no racing thoughts, no impulsivity or risky behaviors, no increased spending, no increased libido, no grandiosity.   Denies dizziness, syncope, seizures, numbness, tingling, tremor, tics, unsteady gait, slurred speech, confusion. Denies muscle or joint pain, stiffness, or dystonia.  Individual Medical History/ Review of Systems: Changes? :No    Past medications for mental health diagnoses include: Wellbutrin, Xanax, Zoloft, trazodone, gabapentin, BuSpar, Ativan, Klonopin  Allergies: Ibuprofen and Clonazepam  Current Medications:  Current Outpatient Medications:  .  acetaminophen (TYLENOL) 500 MG tablet, Take 1,000 mg by mouth every 6 (six) hours as needed (for pain OR back pain). , Disp: , Rfl:  .  albuterol (PROVENTIL HFA;VENTOLIN HFA) 108 (90 Base)  MCG/ACT inhaler, Inhale 2 puffs into the lungs every 6 (six) hours as needed for wheezing or shortness of breath., Disp: 1 Inhaler, Rfl: 2 .  ALPRAZolam (XANAX) 1 MG tablet, Take 0.5-1 tablets (0.5-1 mg total) by mouth 2 (two) times daily as needed. for anxiety, Disp: 60 tablet, Rfl: 5 .  atorvastatin (LIPITOR) 10 MG tablet, Take 1 tablet (10 mg total) by mouth daily., Disp: 90 tablet, Rfl: 3 .  famotidine (PEPCID) 10 MG tablet, Take 1 tablet (10 mg total) by mouth 2 (two) times daily. May increase to 2 pills twice a day if no improvement after 2 weeks, Disp: 60 tablet, Rfl: 3 .  meloxicam (MOBIC) 7.5 MG tablet, TAKE 1 TABLET(7.5 MG) BY MOUTH DAILY AS NEEDED, Disp: 90 tablet, Rfl: 0 .  sertraline (ZOLOFT) 100 MG tablet, Take 3 tablets (300 mg total) by mouth daily., Disp: 270 tablet, Rfl: 1 .  traZODone (DESYREL) 150 MG tablet, Take 1 tablet (150 mg total) by mouth at bedtime as needed for sleep., Disp: 90 tablet, Rfl: 3 .  fluticasone furoate-vilanterol (BREO ELLIPTA) 100-25 MCG/INH AEPB, Inhale 1 puff into the lungs daily. (Patient not taking: Reported on 01/08/2019), Disp: 1 each, Rfl: 5 .  nystatin (MYCOSTATIN/NYSTOP) 100000 UNIT/GM POWD, Please apply to skin under breasts twice daily until rash resolves. (Patient not taking: Reported on 05/15/2020), Disp: 30 g, Rfl: 1 .  rOPINIRole (REQUIP) 0.5 MG tablet, TAKE 1 TABLET(0.5 MG) BY MOUTH AT BEDTIME (Patient not taking: Reported on 05/15/2020), Disp: 90 tablet, Rfl: 1 Medication Side Effects: none  Family Medical/ Social History:  Changes? No  MENTAL HEALTH EXAM:  There were no vitals taken for this visit.There is no height or weight on file to calculate BMI.  General Appearance: Casual, Neat, Well Groomed and Obese  Eye Contact:  Good  Speech:  Clear and Coherent and Normal Rate  Volume:  Normal  Mood:  Euthymic  Affect:  Appropriate  Thought Process:  Goal Directed and Descriptions of Associations: Intact  Orientation:  Full (Time, Place, and  Person)  Thought Content: Logical   Suicidal Thoughts:  No  Homicidal Thoughts:  No  Memory:  WNL  Judgement:  Good  Insight:  Good  Psychomotor Activity:  Normal  Concentration:  Concentration: Good and Attention Span: Good  Recall:  Good  Fund of Knowledge: Good  Language: Good  Assets:  Desire for Improvement  ADL's:  Intact  Cognition: WNL  Prognosis:  Good    DIAGNOSES:    ICD-10-CM   1. Generalized anxiety disorder  F41.1   2. Episodic mood disorder (HCC)  F39   3. Insomnia, unspecified type  G47.00     Receiving Psychotherapy: No    RECOMMENDATIONS:  PDMP was reviewed. I provided 20 minutes of face-to-face time during this encounter. Her condition is stable and there will be no medication changes. Continue Xanax 1 mg, 1/2-1 twice daily as needed. Continue Zoloft 100 mg, 3 p.o. daily. Continue trazodone 150 mg, 1 p.o. nightly as needed sleep. Return in 6 months.  Donnal Moat, PA-C

## 2020-05-18 ENCOUNTER — Other Ambulatory Visit: Payer: Self-pay

## 2020-05-19 ENCOUNTER — Other Ambulatory Visit: Payer: Self-pay | Admitting: Physician Assistant

## 2020-05-19 MED ORDER — MELOXICAM 7.5 MG PO TABS: ORAL_TABLET | ORAL | 0 refills | Status: DC

## 2020-06-03 ENCOUNTER — Encounter (HOSPITAL_COMMUNITY): Payer: Self-pay | Admitting: Pediatrics

## 2020-06-03 ENCOUNTER — Emergency Department (HOSPITAL_COMMUNITY): Payer: Medicare HMO

## 2020-06-03 ENCOUNTER — Other Ambulatory Visit: Payer: Self-pay

## 2020-06-03 ENCOUNTER — Emergency Department (HOSPITAL_COMMUNITY)
Admission: EM | Admit: 2020-06-03 | Discharge: 2020-06-03 | Disposition: A | Discharge status: Home / Self Care | Payer: Medicare HMO | Attending: Emergency Medicine | Admitting: Emergency Medicine

## 2020-06-03 DIAGNOSIS — Z79899 Other long term (current) drug therapy: Secondary | ICD-10-CM | POA: Diagnosis not present

## 2020-06-03 DIAGNOSIS — S0990XA Unspecified injury of head, initial encounter: Secondary | ICD-10-CM | POA: Diagnosis not present

## 2020-06-03 DIAGNOSIS — R0781 Pleurodynia: Secondary | ICD-10-CM | POA: Diagnosis not present

## 2020-06-03 DIAGNOSIS — R1012 Left upper quadrant pain: Secondary | ICD-10-CM | POA: Diagnosis not present

## 2020-06-03 DIAGNOSIS — Y929 Unspecified place or not applicable: Secondary | ICD-10-CM | POA: Insufficient documentation

## 2020-06-03 DIAGNOSIS — S20212A Contusion of left front wall of thorax, initial encounter: Secondary | ICD-10-CM | POA: Diagnosis not present

## 2020-06-03 DIAGNOSIS — Y9389 Activity, other specified: Secondary | ICD-10-CM | POA: Diagnosis not present

## 2020-06-03 DIAGNOSIS — W19XXXA Unspecified fall, initial encounter: Secondary | ICD-10-CM | POA: Insufficient documentation

## 2020-06-03 DIAGNOSIS — S20219A Contusion of unspecified front wall of thorax, initial encounter: Secondary | ICD-10-CM | POA: Diagnosis not present

## 2020-06-03 DIAGNOSIS — S3991XA Unspecified injury of abdomen, initial encounter: Secondary | ICD-10-CM | POA: Diagnosis not present

## 2020-06-03 DIAGNOSIS — R350 Frequency of micturition: Secondary | ICD-10-CM | POA: Diagnosis not present

## 2020-06-03 DIAGNOSIS — S199XXA Unspecified injury of neck, initial encounter: Secondary | ICD-10-CM | POA: Diagnosis not present

## 2020-06-03 DIAGNOSIS — Z87891 Personal history of nicotine dependence: Secondary | ICD-10-CM | POA: Insufficient documentation

## 2020-06-03 DIAGNOSIS — M542 Cervicalgia: Secondary | ICD-10-CM | POA: Insufficient documentation

## 2020-06-03 DIAGNOSIS — R55 Syncope and collapse: Secondary | ICD-10-CM

## 2020-06-03 DIAGNOSIS — N390 Urinary tract infection, site not specified: Secondary | ICD-10-CM | POA: Insufficient documentation

## 2020-06-03 DIAGNOSIS — S29001A Unspecified injury of muscle and tendon of front wall of thorax, initial encounter: Secondary | ICD-10-CM | POA: Diagnosis present

## 2020-06-03 DIAGNOSIS — J45909 Unspecified asthma, uncomplicated: Secondary | ICD-10-CM | POA: Insufficient documentation

## 2020-06-03 DIAGNOSIS — S299XXA Unspecified injury of thorax, initial encounter: Secondary | ICD-10-CM | POA: Diagnosis not present

## 2020-06-03 DIAGNOSIS — S3993XA Unspecified injury of pelvis, initial encounter: Secondary | ICD-10-CM | POA: Diagnosis not present

## 2020-06-03 DIAGNOSIS — Y999 Unspecified external cause status: Secondary | ICD-10-CM | POA: Diagnosis not present

## 2020-06-03 LAB — URINALYSIS, ROUTINE W REFLEX MICROSCOPIC
Bilirubin Urine: NEGATIVE
Glucose, UA: NEGATIVE mg/dL
Hgb urine dipstick: NEGATIVE
Ketones, ur: NEGATIVE mg/dL
Nitrite: NEGATIVE
Protein, ur: NEGATIVE mg/dL
Specific Gravity, Urine: 1.009 (ref 1.005–1.030)
pH: 5 (ref 5.0–8.0)

## 2020-06-03 LAB — CBC
HCT: 48.1 % — ABNORMAL HIGH (ref 36.0–46.0)
Hemoglobin: 15.7 g/dL — ABNORMAL HIGH (ref 12.0–15.0)
MCH: 29.6 pg (ref 26.0–34.0)
MCHC: 32.6 g/dL (ref 30.0–36.0)
MCV: 90.8 fL (ref 80.0–100.0)
Platelets: 127 10*3/uL — ABNORMAL LOW (ref 150–400)
RBC: 5.3 MIL/uL — ABNORMAL HIGH (ref 3.87–5.11)
RDW: 14 % (ref 11.5–15.5)
WBC: 7.4 10*3/uL (ref 4.0–10.5)
nRBC: 0 % (ref 0.0–0.2)

## 2020-06-03 LAB — BASIC METABOLIC PANEL
Anion gap: 10 (ref 5–15)
BUN: 7 mg/dL — ABNORMAL LOW (ref 8–23)
CO2: 28 mmol/L (ref 22–32)
Calcium: 9.4 mg/dL (ref 8.9–10.3)
Chloride: 101 mmol/L (ref 98–111)
Creatinine, Ser: 0.59 mg/dL (ref 0.44–1.00)
GFR calc Af Amer: >60 mL/min (ref >60)
GFR calc non Af Amer: >60 mL/min (ref >60)
Glucose, Bld: 110 mg/dL — ABNORMAL HIGH (ref 70–99)
Potassium: 4.1 mmol/L (ref 3.5–5.1)
Sodium: 139 mmol/L (ref 135–145)

## 2020-06-03 LAB — HEPATIC FUNCTION PANEL
ALT: 10 U/L (ref 0–44)
AST: 17 U/L (ref 15–41)
Albumin: 3.5 g/dL (ref 3.5–5.0)
Alkaline Phosphatase: 107 U/L (ref 38–126)
Bilirubin, Direct: 0.2 mg/dL (ref 0.0–0.2)
Indirect Bilirubin: 0.6 mg/dL (ref 0.3–0.9)
Total Bilirubin: 0.8 mg/dL (ref 0.3–1.2)
Total Protein: 6.3 g/dL — ABNORMAL LOW (ref 6.5–8.1)

## 2020-06-03 LAB — CBG MONITORING, ED: Glucose-Capillary: 106 mg/dL — ABNORMAL HIGH (ref 70–99)

## 2020-06-03 LAB — TROPONIN I (HIGH SENSITIVITY)
Troponin I (High Sensitivity): 7 ng/L (ref <18)
Troponin I (High Sensitivity): 5 ng/L (ref <18)

## 2020-06-03 MED ORDER — SODIUM CHLORIDE 0.9% FLUSH
3.0000 mL | Freq: Once | INTRAVENOUS | Status: AC
  Administered 2020-06-03: 3 mL via INTRAVENOUS

## 2020-06-03 MED ORDER — IOHEXOL 300 MG/ML  SOLN
100.0000 mL | Freq: Once | INTRAMUSCULAR | Status: AC | PRN
  Administered 2020-06-03: 100 mL via INTRAVENOUS

## 2020-06-03 MED ORDER — METHOCARBAMOL 500 MG PO TABS
500.0000 mg | ORAL_TABLET | Freq: Three times a day (TID) | ORAL | 0 refills | Status: DC | PRN
Start: 2020-06-03 — End: 2020-06-16

## 2020-06-03 MED ORDER — LACTATED RINGERS IV SOLN: INTRAVENOUS | Status: DC

## 2020-06-03 MED ORDER — CEPHALEXIN 250 MG PO CAPS
500.0000 mg | ORAL_CAPSULE | Freq: Once | ORAL | Status: AC
  Administered 2020-06-03: 500 mg via ORAL
  Filled 2020-06-03: qty 2

## 2020-06-03 MED ORDER — FENTANYL CITRATE (PF) 100 MCG/2ML IJ SOLN
50.0000 ug | Freq: Once | INTRAMUSCULAR | Status: AC
  Administered 2020-06-03: 50 ug via INTRAVENOUS
  Filled 2020-06-03: qty 2

## 2020-06-03 MED ORDER — METHOCARBAMOL 500 MG PO TABS
750.0000 mg | ORAL_TABLET | Freq: Once | ORAL | Status: AC
  Administered 2020-06-03: 750 mg via ORAL
  Filled 2020-06-03: qty 2

## 2020-06-03 MED ORDER — HYDROCODONE-ACETAMINOPHEN 5-325 MG PO TABS: 1.0000 | ORAL_TABLET | Freq: Four times a day (QID) | ORAL | 0 refills | Status: DC | PRN

## 2020-06-03 MED ORDER — HYDROCODONE-ACETAMINOPHEN 5-325 MG PO TABS
1.0000 | ORAL_TABLET | Freq: Once | ORAL | Status: AC
  Administered 2020-06-03: 1 via ORAL
  Filled 2020-06-03: qty 1

## 2020-06-03 MED ORDER — CEPHALEXIN 500 MG PO CAPS
500.0000 mg | ORAL_CAPSULE | Freq: Four times a day (QID) | ORAL | 0 refills | Status: AC
Start: 2020-06-03 — End: 2020-06-10

## 2020-06-03 NOTE — Discharge Instructions (Addendum)
Please do not drive, operate heavy machinery, bathe/swim or perform other tasks that would be dangerous to you or anyone else if you were to pass out until cleared to do so by your primary care doctor.  Today you received medications that may make you sleepy or impair your ability to make decisions.  For the next 24 hours please do not drive, operate heavy machinery, care for a small child with out another adult present, or perform any activities that may cause harm to you or someone else if you were to fall asleep or be impaired.   You are being prescribed a medication which may make you sleepy. Please follow up of listed precautions for at least 24 hours after taking one dose.  Mixing vicodin with sedating medications can cause your breathing to slow or worsen.  Please use caution when taking vicodin.  Do not take the baclofen and Robaxin/methocarbamol at the same time.  You need to be using either one or the other not both of them.

## 2020-06-03 NOTE — ED Notes (Signed)
Main lab to add on HFP

## 2020-06-03 NOTE — ED Provider Notes (Signed)
[facility] EMERGENCY DEPARTMENT Provider Note   CSN: [id number] Arrival date & time: 06/03/20  1016     History Chief Complaint  Patient presents with  . Fall  . Loss of Consciousness    [name] [name] is a 64 y.o. female with a past medical history of anxiety, obesity hypoventilation syndrome, sleep apnea, depression, who presents today for evaluation after a syncopal event.  She reports that yesterday morning she was outside changing the water in the birdbath when she got hot, felt very shaky and fell backwards passing out.  She believes she was out for under a minute.  She was on the ground for about 15 minutes to 30 minutes.  She denies any chest pain or shortness of breath prior to the syncopal event however since this event she has had pain in the left side of her chest.  She thinks she may have broken a rib.  She states that she checks her oxygen at home, normally at rest she runs about 92 to 93%.  She does not normally wear oxygen unless she gets sick.  She reports that yesterday morning when she woke up she was feeling well.  She denies any new pain in her legs or arms bilaterally.  Her pain has not radiated or moved.  She denies dysuria however does report that over the past 2 to 3 months she has had urinary frequency, urgency, and incontinence.  She has not seen her primary care doctor for this.  She is not vaccinated against covid, denies any known covid contacts.   HPI     Past Medical History:  Diagnosis Date  . Anxiety   . Asthma    Chronic, with restrictive component 2/2 obesity  . Depression   . GERD (gastroesophageal reflux disease)   . Headache   . Insomnia   . Obesity   . On home oxygen therapy    "3L at night only when I get sick" (04/30/2014)  . Shortness of breath dyspnea   . Sleep apnea    "couldn't afford mask so I didn't get it" (04/30/2014)    Patient Active Problem List   Diagnosis Date Noted  . Hypoxia, sleep related  06/13/2019  . Posterior right knee pain 02/06/2019  . Disequilibrium 02/06/2019  . OCD (obsessive compulsive disorder) 08/13/2018  . Aortic atherosclerosis ([place]) 08/17/2016  . Rash and nonspecific skin eruption 12/17/2015  . Periodic limb movement disorder 08/14/2015  . Obesity hypoventilation syndrome ([place]) 01/21/2015  . Health care maintenance 07/01/2014  . Asthma 05/28/2013  . OSA (obstructive sleep apnea) 04/04/2013  . Anxiety disorder 07/02/2012  . Depression 06/28/2012  . Morbid obesity ([place]) 03/02/2012  . GERD (gastroesophageal reflux disease) 02/05/2012    Past Surgical History:  Procedure Laterality Date  . APPENDECTOMY  ~ 2002  . VAGINAL HYSTERECTOMY  ~ 1978     OB History   No obstetric history on file.     Family History  Problem Relation Age of Onset  . Breast cancer Mother   . Alcohol abuse Mother   . Emphysema Mother        smoked  . Asthma Mother   . Breast cancer Maternal Grandmother   . Breast cancer Maternal Aunt     Social History   Tobacco Use  . Smoking status: Former Smoker    Packs/day: 0.00    Years: 44.00    Pack years: 0.00    Quit date: 11/07/2012    Years since  quitting: 7.5  . Smokeless tobacco: Never Used  Substance Use Topics  . Alcohol use: No    Alcohol/week: 0.0 standard drinks  . Drug use: No    Home Medications Prior to Admission medications   Medication Sig Start Date End Date Taking? Authorizing Provider  acetaminophen (TYLENOL) 500 MG tablet Take 1,000 mg by mouth every 6 (six) hours as needed (for pain OR back pain).     [provider]  albuterol (PROVENTIL HFA;VENTOLIN HFA) 108 (90 Base) MCG/ACT inhaler Inhale 2 puffs into the lungs every 6 (six) hours as needed for wheezing or shortness of breath. 09/24/18   Bloomfield, Carley D, DO  ALPRAZolam (XANAX) 1 MG tablet Take 0.5-1 tablets (0.5-1 mg total) by mouth 2 (two) times daily as needed. for anxiety 05/15/20   Donnal Moat T, PA-C  atorvastatin (LIPITOR)  10 MG tablet Take 1 tablet (10 mg total) by mouth daily. 06/13/19   Bartholomew Crews, MD  cephALEXin (KEFLEX) 500 MG capsule Take 1 capsule (500 mg total) by mouth 4 (four) times daily for 7 days. 06/03/20 06/10/20  Lorin Glass, PA-C  famotidine (PEPCID) 10 MG tablet Take 1 tablet (10 mg total) by mouth 2 (two) times daily. May increase to 2 pills twice a day if no improvement after 2 weeks 06/13/19   Bartholomew Crews, MD  fluticasone furoate-vilanterol (BREO ELLIPTA) 100-25 MCG/INH AEPB Inhale 1 puff into the lungs daily. Patient not taking: Reported on 01/08/2019 11/08/18   Bartholomew Crews, MD  HYDROcodone-acetaminophen (NORCO/VICODIN) 5-325 MG tablet Take 1 tablet by mouth every 6 (six) hours as needed for severe pain. 06/03/20   Lorin Glass, PA-C  meloxicam (MOBIC) 7.5 MG tablet Take one tablet by mouth daily as needed. 05/19/20   Bloomfield, Carley D, DO  methocarbamol (ROBAXIN) 500 MG tablet Take 1 tablet (500 mg total) by mouth every 8 (eight) hours as needed for muscle spasms. 06/03/20   Lorin Glass, PA-C  nystatin (MYCOSTATIN/NYSTOP) 100000 UNIT/GM POWD Please apply to skin under breasts twice daily until rash resolves. Patient not taking: Reported on 05/15/2020 12/17/15   Corky Sox, MD  rOPINIRole (REQUIP) 0.5 MG tablet TAKE 1 TABLET(0.5 MG) BY MOUTH AT BEDTIME Patient not taking: Reported on 05/15/2020 03/02/20   Donnal Moat T, PA-C  sertraline (ZOLOFT) 100 MG tablet Take 3 tablets (300 mg total) by mouth daily. 05/15/20   Addison Lank, PA-C  traZODone (DESYREL) 150 MG tablet Take 1 tablet (150 mg total) by mouth at bedtime as needed for sleep. 11/15/19   Addison Lank, PA-C    Allergies    Ibuprofen and Clonazepam  Review of Systems   Review of Systems  Constitutional: Negative for chills and fever.  Respiratory: Negative for cough, shortness of breath and stridor.   Cardiovascular: Positive for chest pain.  Gastrointestinal: Positive for abdominal pain.    Genitourinary: Positive for frequency and urgency. Negative for dysuria.  Musculoskeletal: Positive for neck pain. Negative for back pain.  Skin: Negative for color change and rash.  Neurological: Negative for weakness and headaches.  Psychiatric/Behavioral: Negative for confusion.  All other systems reviewed and are negative.   Physical Exam Updated Vital Signs BP 106/67   Pulse 77   Temp 98.4 F (36.9 C) (Oral)   Resp 23   Ht 5\' 8"  (1.727 m)   Wt (!) 115.7 kg   SpO2 93%   BMI 38.77 kg/m   Physical Exam Vitals and nursing note reviewed.  Constitutional:      General: She is not in acute distress.    Appearance: She is well-developed. She is not diaphoretic.  HENT:     Head: Normocephalic and atraumatic.  Eyes:     General: No scleral icterus.       Right eye: No discharge.        Left eye: No discharge.     Conjunctiva/sclera: Conjunctivae normal.  Cardiovascular:     Rate and Rhythm: Normal rate and regular rhythm.     Pulses: Normal pulses.     Heart sounds: Normal heart sounds.  Pulmonary:     Effort: Pulmonary effort is normal. No respiratory distress.     Breath sounds: Normal breath sounds. No stridor.  Chest:     Chest wall: Tenderness (Left anteriorlateral lower chest) present.  Abdominal:     General: There is no distension.     Tenderness: There is abdominal tenderness (Left upper quadrant). There is no right CVA tenderness or left CVA tenderness.  Musculoskeletal:        General: No deformity.     Cervical back: Normal range of motion and neck supple.     Right lower leg: No edema.     Left lower leg: No edema.  Skin:    General: Skin is warm and dry.  Neurological:     General: No focal deficit present.     Mental Status: She is alert and oriented to person, place, and time.     Motor: No abnormal muscle tone.  Psychiatric:        Mood and Affect: Mood normal.        Behavior: Behavior normal.     ED Results / Procedures / Treatments    Labs (all labs ordered are listed, but only abnormal results are displayed) Labs Reviewed  BASIC METABOLIC PANEL - Abnormal; Notable for the following components:      Result Value   Glucose, Bld 110 (*)    BUN 7 (*)    All other components within normal limits  CBC - Abnormal; Notable for the following components:   RBC 5.30 (*)    Hemoglobin 15.7 (*)    HCT 48.1 (*)    Platelets 127 (*)    All other components within normal limits  URINALYSIS, ROUTINE W REFLEX MICROSCOPIC - Abnormal; Notable for the following components:   APPearance HAZY (*)    Leukocytes,Ua MODERATE (*)    Bacteria, UA MANY (*)    All other components within normal limits  HEPATIC FUNCTION PANEL - Abnormal; Notable for the following components:   Total Protein 6.3 (*)    All other components within normal limits  CBG MONITORING, ED - Abnormal; Notable for the following components:   Glucose-Capillary 106 (*)    All other components within normal limits  URINE CULTURE  TROPONIN I (HIGH SENSITIVITY)  TROPONIN I (HIGH SENSITIVITY)    EKG None  Radiology DG Chest 2 View  Result Date: 06/03/2020 CLINICAL DATA:  Syncope with LEFT rib pain EXAM: CHEST - 2 VIEW COMPARISON:  September 20, 2018, January 18, 2018 FINDINGS: The cardiomediastinal silhouette is unchanged in contour.Prominent bronchovascular markings, similar in comparison the multiple remote priors; this likely reflects small airways disease. No pleural effusion. No pneumothorax. No acute pleuroparenchymal abnormality. Visualized abdomen is unremarkable. No acute displaced rib fracture visualized. IMPRESSION: 1. No acute cardiopulmonary process. Electronically Signed   By: Valentino Saxon MD   On: 06/03/2020 12:09   CT  HEAD WO CONTRAST  Result Date: 06/03/2020 CLINICAL DATA:  Syncope, fall yesterday. EXAM: CT HEAD WITHOUT CONTRAST TECHNIQUE: Contiguous axial images were obtained from the base of the skull through the vertex without intravenous  contrast. COMPARISON:  July 06, 2004. FINDINGS: Brain: Mild chronic ischemic white matter disease is noted. Mild diffuse cortical atrophy is noted. No mass effect or midline shift is noted. Ventricular size is within normal limits. There is no evidence of mass lesion, hemorrhage or acute infarction. Vascular: No hyperdense vessel or unexpected calcification. Skull: Normal. Negative for fracture or focal lesion. Sinuses/Orbits: No acute finding. Other: None. IMPRESSION: Mild chronic ischemic white matter disease. Mild diffuse cortical atrophy. No acute intracranial abnormality seen. Electronically Signed   By: Marijo Conception M.D.   On: 06/03/2020 12:30   CT Chest W Contrast  Result Date: 06/03/2020 CLINICAL DATA:  64 year old female with trauma. EXAM: CT CHEST, ABDOMEN, AND PELVIS WITH CONTRAST TECHNIQUE: Multidetector CT imaging of the chest, abdomen and pelvis was performed following the standard protocol during bolus administration of intravenous contrast. CONTRAST:  151mL OMNIPAQUE IOHEXOL 300 MG/ML  SOLN COMPARISON:  Chest radiograph dated 06/03/2020. FINDINGS: CT CHEST FINDINGS Cardiovascular: There is no cardiomegaly or pericardial effusion. Mild atherosclerotic calcification of the thoracic aorta. No aneurysmal dilatation or dissection. The origins of the great vessels of the aortic arch appear patent as visualized. The central pulmonary arteries are grossly unremarkable. For the degree of opacification. Mediastinum/Nodes: There is no hilar or mediastinal adenopathy. The esophagus is grossly unremarkable. No mediastinal fluid collection. Lungs/Pleura: No focal consolidation, pleural effusion, or pneumothorax. The central airways are patent. Musculoskeletal: Osteopenia. No acute osseous pathology. CT ABDOMEN PELVIS FINDINGS No intra-abdominal free air or free fluid. Hepatobiliary: Faint 12 mm enhancing focus in the left lobe of the liver (47/12) may be artifactual or represent a flash filling hemangioma  or a focal portal venous shunting. The liver is otherwise unremarkable. No intrahepatic biliary ductal dilatation. The gallbladder is unremarkable. Pancreas: Unremarkable. No pancreatic ductal dilatation or surrounding inflammatory changes. Spleen: Normal in size without focal abnormality. Adrenals/Urinary Tract: The adrenal glands are unremarkable. There is no hydronephrosis on either side. There is symmetric enhancement and excretion of contrast by both kidneys. The visualized ureters and urinary bladder appear unremarkable. Stomach/Bowel: There is sigmoid diverticulosis without active inflammatory changes. There is no bowel obstruction or active inflammation. The appendix is normal. Vascular/Lymphatic: Mild aortoiliac atherosclerotic disease. The IVC is unremarkable. No portal venous gas. There is no adenopathy. Reproductive: Hysterectomy. Other: Small fat containing umbilical hernia. Musculoskeletal: Osteopenia. No acute osseous pathology. IMPRESSION: 1. No acute/traumatic intrathoracic, abdominal, or pelvic pathology. 2. Sigmoid diverticulosis. 3. Aortic Atherosclerosis (ICD10-I70.0). Electronically Signed   By: Anner Crete M.D.   On: 06/03/2020 18:58   CT Cervical Spine Wo Contrast  Result Date: 06/03/2020 CLINICAL DATA:  Fall possible fracture EXAM: CT CERVICAL SPINE WITHOUT CONTRAST TECHNIQUE: Multidetector CT imaging of the cervical spine was performed without intravenous contrast. Multiplanar CT image reconstructions were also generated. COMPARISON:  None. FINDINGS: Alignment: Motion degradation limits the examination. Generalized straightening of the cervical spine. No subluxation. Facet alignment is maintained Skull base and vertebrae: No acute fracture. No primary bone lesion or focal pathologic process. Soft tissues and spinal canal: No prevertebral fluid or swelling. No visible canal hematoma. Disc levels: Mild multiple level degenerative changes with mild disc space narrowing at C3-C4 and  C5-C6. Mild facet degenerative changes at multiple levels Upper chest: Negative. Other: None IMPRESSION: Motion degradation limits the examination. No  definite CT evidence for acute osseous abnormality. Electronically Signed   By: Donavan Foil M.D.   On: 06/03/2020 18:48   CT Abdomen Pelvis W Contrast  Result Date: 06/03/2020 CLINICAL DATA:  63 year old female with trauma. EXAM: CT CHEST, ABDOMEN, AND PELVIS WITH CONTRAST TECHNIQUE: Multidetector CT imaging of the chest, abdomen and pelvis was performed following the standard protocol during bolus administration of intravenous contrast. CONTRAST:  188mL OMNIPAQUE IOHEXOL 300 MG/ML  SOLN COMPARISON:  Chest radiograph dated 06/03/2020. FINDINGS: CT CHEST FINDINGS Cardiovascular: There is no cardiomegaly or pericardial effusion. Mild atherosclerotic calcification of the thoracic aorta. No aneurysmal dilatation or dissection. The origins of the great vessels of the aortic arch appear patent as visualized. The central pulmonary arteries are grossly unremarkable. For the degree of opacification. Mediastinum/Nodes: There is no hilar or mediastinal adenopathy. The esophagus is grossly unremarkable. No mediastinal fluid collection. Lungs/Pleura: No focal consolidation, pleural effusion, or pneumothorax. The central airways are patent. Musculoskeletal: Osteopenia. No acute osseous pathology. CT ABDOMEN PELVIS FINDINGS No intra-abdominal free air or free fluid. Hepatobiliary: Faint 12 mm enhancing focus in the left lobe of the liver (47/12) may be artifactual or represent a flash filling hemangioma or a focal portal venous shunting. The liver is otherwise unremarkable. No intrahepatic biliary ductal dilatation. The gallbladder is unremarkable. Pancreas: Unremarkable. No pancreatic ductal dilatation or surrounding inflammatory changes. Spleen: Normal in size without focal abnormality. Adrenals/Urinary Tract: The adrenal glands are unremarkable. There is no hydronephrosis  on either side. There is symmetric enhancement and excretion of contrast by both kidneys. The visualized ureters and urinary bladder appear unremarkable. Stomach/Bowel: There is sigmoid diverticulosis without active inflammatory changes. There is no bowel obstruction or active inflammation. The appendix is normal. Vascular/Lymphatic: Mild aortoiliac atherosclerotic disease. The IVC is unremarkable. No portal venous gas. There is no adenopathy. Reproductive: Hysterectomy. Other: Small fat containing umbilical hernia. Musculoskeletal: Osteopenia. No acute osseous pathology. IMPRESSION: 1. No acute/traumatic intrathoracic, abdominal, or pelvic pathology. 2. Sigmoid diverticulosis. 3. Aortic Atherosclerosis (ICD10-I70.0). Electronically Signed   By: Anner Crete M.D.   On: 06/03/2020 18:58    Procedures Procedures (including critical care time)  Medications Ordered in ED Medications  lactated ringers infusion ( Intravenous Stopped 06/03/20 2016)  sodium chloride flush (NS) 0.9 % injection 3 mL (3 mLs Intravenous Given 06/03/20 1658)  fentaNYL (SUBLIMAZE) injection 50 mcg (50 mcg Intravenous Given 06/03/20 1723)  iohexol (OMNIPAQUE) 300 MG/ML solution 100 mL (100 mLs Intravenous Contrast Given 06/03/20 1820)  methocarbamol (ROBAXIN) tablet 750 mg (750 mg Oral Given 06/03/20 2011)  HYDROcodone-acetaminophen (NORCO/VICODIN) 5-325 MG per tablet 1 tablet (1 tablet Oral Given 06/03/20 2011)  cephALEXin (KEFLEX) capsule 500 mg (500 mg Oral Given 06/03/20 2028)    ED Course  I have reviewed the triage vital signs and the nursing notes.  Pertinent labs & imaging results that were available during my care of the patient were reviewed by me and considered in my medical decision making (see chart for details).    MDM Rules/Calculators/A&P                         Patient is a 64 year old woman who presents today for evaluation after syncopal episode that occurred yesterday morning.  She presents today due to  pain in her left side and she is concerned she broke her rib.  On my exam she does have some left-sided chest pain however this appears reproducible.  The pain does extend into her left upper  quadrant of her abdomen.  Troponin is not elevated.  BMP is unremarkable.  CBC is without anemia.  UA does show moderate leukocytes with 21-50 whites and many bacteria.  This combined with her frequency and urgency most likely represents a urinary tract infection.  She is not orthostatic.  She is not hypotensive.  Given that she hit her head with headache and mild neck pain CT head and neck were obtained without evidence of fracture or other acute abnormality.  Chest x-ray obtained without evidence of pneumonia, rib fracture, pneumothorax or other acute abnormality.  Based on the degree of her pain, along with the abdominal pain concerning for intrathoracic/intra-abdominal injury such as a splenic insult.  CT chest, abdomen, pelvis was obtained without evidence of rib fracture, pneumothorax, pulmonary contusion or other acute abnormality.    Given that her pain is reproducible started after she fell, with reassuring CT scans I suspect that her pain is more musculoskeletal in nature and contusion related.  Negative troponins, doubt ACS.  Orr syncope score is low risk.  She is started on Robaxin, recommended that she discontinue any baclofen while taking this.  She is given a prescription for short course of Vicodin.  She was not significantly hypoxic beyond her baseline while in the emergency room, and was instructed to continue to monitor her oxygen at home and use her home oxygen if needed.  She is given an incentive spirometer with instructions on how to use it.  Will treat likely UTI with keflex, urine culture is sent.   Return precautions were discussed with patient who states their understanding.  At the time of discharge patient denied any unaddressed complaints or concerns.  Patient is agreeable  for discharge home.  Note: Portions of this report may have been transcribed using voice recognition software. Every effort was made to ensure accuracy; however, inadvertent computerized transcription errors may be present   This patient was seen as a shared visit with Dr. Johnney Killian.   Final Clinical Impression(s) / ED Diagnoses Final diagnoses:  Syncope and collapse  Lower urinary tract infectious disease  Chest wall contusion, left, initial encounter    Rx / DC Orders ED Discharge Orders         Ordered    HYDROcodone-acetaminophen (NORCO/VICODIN) 5-325 MG tablet  Every 6 hours PRN     Discontinue  Reprint     06/03/20 2026    methocarbamol (ROBAXIN) 500 MG tablet  Every 8 hours PRN     Discontinue  Reprint     06/03/20 2026    cephALEXin (KEFLEX) 500 MG capsule  4 times daily     Discontinue  Reprint     06/03/20 2026           Lorin Glass, PA-C 06/04/20 0030    Charlesetta Shanks, MD 06/10/20 [phone]

## 2020-06-03 NOTE — ED Triage Notes (Signed)
Patient stated yesterday in the morning around 0830 am, she fell backwards, she believed she passed out while standing and was laying on the ground for 15 mins, states she was out for about 10 secs. Denies blood thinners. C/O left rib pain.

## 2020-06-03 NOTE — ED Provider Notes (Signed)
Medical screening examination/treatment/procedure(s) were conducted as a shared visit with non-physician practitioner(s) and myself.  I personally evaluated the patient during the encounter.    Patient reports she was changing out her hummingbird feeder.  She reports that she felt shaky and fell backwards.  She reports she landed on her right side and thinks that she lost consciousness for less than a minute.  She reports she had to lay on the ground for about 15 minutes.  She did not have chest pain or shortness of breath before the episode.  She reports since then she has had a severe pain in her left ribs under the left breast.  Is much worse with twisting and trying to sit forward.  It hurts with cough or a deep breath.  Patient is alert and appropriate.  No confusion.  Lungs are clear.  Heart is regular.  Patient has severe wincing in pain as she tries to shift positions in the stretcher.  I am able to help her transition from supine to sitting.  In sitting position no respiratory distress.  With pressure over the lower lateral anterior chest wall patient has severe reproducible pain.  No crepitus, hematomas or abrasions.  No peripheral edema.  Normal neurologic function.   Patient presents as outlined above.  She has had CT scans without any acute findings.  Patient has very reproducible pain by both movement and pressure.  At this time I suspect a abdominal wall muscle or intercostal muscle tear.  Patient is otherwise at baseline for function.  Will trial Robaxin and Vicodin for pain control.   Charlesetta Shanks, MD 06/03/20 2013

## 2020-06-15 NOTE — Progress Notes (Signed)
   CC: ED follow up on fall, pain and SOB  HPI:  [name] is a 64 y.o. female with history as below presenting for ED follow up for a ground level fall, presenting with continued pain and increasing SOB. Please refer to problem based charting for further details of assessment and plan of current problem and chronic medical conditions.  Past Medical History:  Diagnosis Date  . Anxiety   . Asthma    Chronic, with restrictive component 2/2 obesity  . Depression   . GERD (gastroesophageal reflux disease)   . Headache   . Insomnia   . Obesity   . On home oxygen therapy    "3L at night only when I get sick" (04/30/2014)  . Shortness of breath dyspnea   . Sleep apnea    "couldn't afford mask so I didn't get it" (04/30/2014)    Review of Systems:   Review of Systems  Constitutional: Negative for chills and fever.  Respiratory: Positive for shortness of breath. Negative for cough (baseline).   Cardiovascular: Negative for chest pain and palpitations.  Gastrointestinal: Negative for constipation.  Genitourinary: Negative for dysuria, frequency and urgency.  Musculoskeletal: Positive for myalgias.  Neurological: Negative for dizziness, seizures and headaches.     Physical Exam: Vitals:   06/16/20 1416  BP: (!) 154/86  Pulse: 88  Temp: 98.2 F (36.8 C)  TempSrc: Oral  SpO2: 90%  Weight: 263 lb 6.4 oz (119.5 kg)  Height: 5\' 8"  (1.727 m)   Oxygen saturation decreased to 86% on room air while ambulating.  Constitutional: no acute distress, obese Head: atraumatic ENT: external ears normal Cardiovascular: regular rate and rhythm, normal heart sounds Pulmonary: effort normal, depth of breathing limited by pain, otherwise normal breath sounds bilaterally Abdominal: flat, nontender, no rebound tenderness, bowel sounds normal Musculoskeletal: tenderness over L rib area, no ecchymosis Skin: warm and dry Neurological: alert, no focal deficit Psychiatric: normal mood  and affect  Assessment & Plan:   See Encounters Tab for problem based charting.  Patient seen with Dr. Daryll Drown

## 2020-06-16 ENCOUNTER — Ambulatory Visit (INDEPENDENT_AMBULATORY_CARE_PROVIDER_SITE_OTHER): Payer: Medicare HMO | Admitting: Student

## 2020-06-16 ENCOUNTER — Ambulatory Visit (HOSPITAL_COMMUNITY)
Admission: RE | Admit: 2020-06-16 | Discharge: 2020-06-16 | Disposition: A | Discharge status: Home / Self Care | Payer: Medicare HMO | Source: Ambulatory Visit | Attending: Internal Medicine | Admitting: Internal Medicine

## 2020-06-16 ENCOUNTER — Encounter: Payer: Self-pay | Admitting: Student

## 2020-06-16 ENCOUNTER — Other Ambulatory Visit: Payer: Self-pay

## 2020-06-16 VITALS — BP 154/86 | HR 88 | Temp 98.2°F | Ht 68.0 in | Wt 263.4 lb

## 2020-06-16 DIAGNOSIS — K219 Gastro-esophageal reflux disease without esophagitis: Secondary | ICD-10-CM

## 2020-06-16 DIAGNOSIS — Z0000 Encounter for general adult medical examination without abnormal findings: Secondary | ICD-10-CM | POA: Diagnosis not present

## 2020-06-16 DIAGNOSIS — J45909 Unspecified asthma, uncomplicated: Secondary | ICD-10-CM | POA: Diagnosis not present

## 2020-06-16 DIAGNOSIS — I700 Atherosclerosis of aorta: Secondary | ICD-10-CM | POA: Diagnosis not present

## 2020-06-16 DIAGNOSIS — R0602 Shortness of breath: Secondary | ICD-10-CM

## 2020-06-16 DIAGNOSIS — G4761 Periodic limb movement disorder: Secondary | ICD-10-CM | POA: Diagnosis not present

## 2020-06-16 DIAGNOSIS — Z9181 History of falling: Secondary | ICD-10-CM

## 2020-06-16 DIAGNOSIS — W1830XA Fall on same level, unspecified, initial encounter: Secondary | ICD-10-CM

## 2020-06-16 MED ORDER — FLUTICASONE FUROATE-VILANTEROL 100-25 MCG/INH IN AEPB: 1.0000 | INHALATION_SPRAY | Freq: Every day | RESPIRATORY_TRACT | 5 refills | Status: DC

## 2020-06-16 MED ORDER — ALBUTEROL SULFATE HFA 108 (90 BASE) MCG/ACT IN AERS: 2.0000 | INHALATION_SPRAY | Freq: Four times a day (QID) | RESPIRATORY_TRACT | 5 refills | Status: DC | PRN

## 2020-06-16 MED ORDER — METHOCARBAMOL 500 MG PO TABS: 500.0000 mg | ORAL_TABLET | Freq: Three times a day (TID) | ORAL | 0 refills | Status: DC | PRN

## 2020-06-16 MED ORDER — PANTOPRAZOLE SODIUM 20 MG PO TBEC: 20.0000 mg | DELAYED_RELEASE_TABLET | Freq: Every day | ORAL | 1 refills | Status: DC

## 2020-06-16 MED ORDER — LIDOCAINE 4 % EX PTCH: 1.0000 | MEDICATED_PATCH | Freq: Every day | CUTANEOUS | 0 refills | Status: DC | PRN

## 2020-06-16 NOTE — Assessment & Plan Note (Signed)
Declines colon CA screening today. Reports poor experience with prior colonoscopy, and declines even fecal HIT testing.

## 2020-06-16 NOTE — Patient Instructions (Signed)
Thank you for allowing [place] to be a part of your care today, it was pleasure seeing you. We discussed your recent fall, shortness of breath, GERD, and health screenings.  For your pain, I will continue your Robaxin and start lidocaine patches. Try to stay hydrated to avoid further falls, especially with this hot weather. For your shortness of breath, I want to get a chest x-ray, refill your inhalers, and give you some oxygen at home. We also discussed the inventive spirometer, which you can keep next you at home and use periodically.  We also discussed some of your chronic issues. I am going to start Protonix for your acid reflux. You have declined colon cancer screening. If you change your mind, please let us know.  I have made these changes to your medications: Start lidocaine patches' Refill Robaxin Refill albuterol and breo-ellipta inhalers Start home oxygen, 2L Start Protonix 20mg   Please follow up in 2 months.   Thank you, and please call the Internal Medicine Clinic at [phone] if you have any questions.  Best, Dr. Bridgett Larsson

## 2020-06-16 NOTE — Assessment & Plan Note (Signed)
Has not used requip in some time. Denies any problems and states that she has restful sleep. Patient will let us know if she has worsening issues.  -Remove requip from medication list

## 2020-06-16 NOTE — Assessment & Plan Note (Signed)
Since recent fall with L rib area pain with resulting shallow respirations, has noted increased dyspnea on exertion. Has a pulse ox at home and reports readings as low as 73% at night. Hx of asthma and smoked 2ppd for 40 years until she quit 7 years ago. Prescribed albuterol and Breo-ellipta, but out of inhalers right now. Denies fevers, chills, or sputum production. States her cough is at baseline. She was previously on O2 2L at night but took herself off of it 1.5 years ago. On ambulation here down to 86%.  -refill inhalers -restart home O2 -CXR -incentive spirometer -pain control with robaxin and lidocaine patches to encourage better respiration

## 2020-06-16 NOTE — Assessment & Plan Note (Signed)
Demonstrated on CT 06/03/20. 3.4% 10-year ASCVD risk but has significant smoking history with chronic pulmonary disease. Started on 10mg  Lipitor at ED. No muscle cramping  Lipid Panel     Component Value Date/Time   CHOL 184 01/19/2015 2109   TRIG 64 01/19/2015 2109   HDL 53 01/19/2015 2109   CHOLHDL 3.5 01/19/2015 2109   VLDL 13 01/19/2015 2109   LDLCALC 118 (H) 01/19/2015 2109   -continue Lipitor 10mg 

## 2020-06-16 NOTE — Assessment & Plan Note (Signed)
Reports improvement in symptoms while on pepcid, but still having reflux especially with spicy or acidic foods.  -continue Pepcid 10mg  daily -start Protonix 20mg  daily

## 2020-06-16 NOTE — Assessment & Plan Note (Signed)
Smoked 2ppd for 40 years, quit 7 years ago. Prescribed albuterol and Breo-ellipta, but out of inhalers right now. Increase DOE since recent fall, worsening progressively. Previously on O2 2L at night but took herself off of it 1.5 years ago.   -refill Breo-Ellipta -refill albuterol -home O2 DME ordered

## 2020-06-16 NOTE — Assessment & Plan Note (Signed)
On 7/28 was outside refilling her bird feeders early in the morning when she felt lightheaded and shaky, then fell backwards and passed out. Has had that lightheadedness before but never passed out. Believes she was out for about 10 seconds when a passing stranger helped her up. Felt well earlier in the day aside from urinary frequency. Denies CP, palpitations, seizure, SOB. Evaluated at ED, negative CXR, neck CT, thoracic CT, abd/pelv CT. Started on Robaxin and short course of Vicodin. Since then has had increasing SOB. Pain has improved, but still 8/10.  -no evidence of cardiac or neurologic cause -refilled robaxin  -start lidocaine patches -counseled on hydration

## 2020-06-17 NOTE — Progress Notes (Signed)
Internal Medicine Clinic Attending  I saw and evaluated the patient.  I personally confirmed the key portions of the history and exam documented by Dr. Chen and I reviewed pertinent patient test results.  The assessment, diagnosis, and plan were formulated together and I agree with the documentation in the resident's note.  

## 2020-06-19 ENCOUNTER — Telehealth: Payer: Self-pay | Admitting: *Deleted

## 2020-06-19 NOTE — Telephone Encounter (Signed)
CM sent to Skeet Latch at [employer] for home oxygen. F2F was 06/16/2020. Hubbard Hartshorn, BSN, RN-BC

## 2020-06-19 NOTE — Telephone Encounter (Signed)
Theophilus Bones, RN; Rockne Coons, Delaney Meigs, [place]; [place], [place], [place]   Yes ma'am     ----- Message -----  From: Velora Heckler, RN  Sent: 06/19/2020 10:16 AM EDT  To: Darlina Guys, Roland Earl, *  Subject: RE: Home oxygen                  Thank you, will someone contact her today?   ----- Message -----  From: Skeet Latch  Sent: 06/19/2020 10:09 AM EDT  To: Darlina Guys, Roland Earl, *  Subject: RE: Home oxygen                    received, thanks!

## 2020-06-22 ENCOUNTER — Telehealth: Payer: Self-pay

## 2020-06-22 NOTE — Telephone Encounter (Signed)
Please call pt back about oxygen.

## 2020-06-22 NOTE — Telephone Encounter (Signed)
Returned call to patient. States [employer] Rep that she spoke to on 06/19/2020 was "hateful" to her. States  Rep was unprofessional in the way she spoke to her so patient no longer wants to work with [employer]. Apologized to patient that she experienced that. Will notify Darlina Guys at [place]. Will send CM to [facility] at [facility] for home O2 order. Patient was very Patent attorney. Hubbard Hartshorn, BSN, RN-BC

## 2020-06-23 NOTE — Telephone Encounter (Signed)
[place], Danna Hefty, Orvis Brill, RN; [place], [place], [place]; [place], Gilda; [place], Ashly Good Afternoon,   I have called and spoke with patient and she is wanting to get the Oxygen from [place] she is wanting to get a POC, and she will need to be brought back in for a complete 6 min walk as they didn't recover the patient on O2 and also the order will need to be placed by the same doctor who is signing the order   Estill Bamberg   Any questions please call the office patient is aware that she will need to be tested correctly before we can proceed.

## 2020-06-25 NOTE — Telephone Encounter (Signed)
Patient has appt 06/30/2020 for qualifying O2 sats. Hubbard Hartshorn, BSN, RN-BC

## 2020-06-30 ENCOUNTER — Other Ambulatory Visit: Payer: Self-pay

## 2020-06-30 ENCOUNTER — Ambulatory Visit (INDEPENDENT_AMBULATORY_CARE_PROVIDER_SITE_OTHER): Payer: Medicare HMO | Admitting: Internal Medicine

## 2020-06-30 VITALS — BP 134/77 | HR 90 | Temp 97.9°F | Ht 68.0 in | Wt 263.5 lb

## 2020-06-30 DIAGNOSIS — R0781 Pleurodynia: Secondary | ICD-10-CM | POA: Diagnosis not present

## 2020-06-30 DIAGNOSIS — J9621 Acute and chronic respiratory failure with hypoxia: Secondary | ICD-10-CM | POA: Insufficient documentation

## 2020-06-30 DIAGNOSIS — J9611 Chronic respiratory failure with hypoxia: Secondary | ICD-10-CM | POA: Diagnosis not present

## 2020-06-30 DIAGNOSIS — R0602 Shortness of breath: Secondary | ICD-10-CM

## 2020-06-30 DIAGNOSIS — W1830XA Fall on same level, unspecified, initial encounter: Secondary | ICD-10-CM

## 2020-06-30 MED ORDER — LIDOCAINE 4 % EX PTCH: 2.0000 | MEDICATED_PATCH | Freq: Every day | CUTANEOUS | 0 refills | Status: AC | PRN

## 2020-06-30 NOTE — Patient Instructions (Signed)
Thank you for allowing [place] to provide your care today. Today we discussed your shortness of breath and supplemental oxygen requirement.  Our evaluation today showed that you require supplemental oxygen for ambulation. I placed an order for portable oxygen tank.  I also refer you for sleep study and also pulmonary function test.   For your rib pain, you can continue lidocaine patch as needed.  I can send a refill that for you. It may take few weeks for the pain to be resolved.   Please come back to clinic in 4 weeks for follow-up of your shortness of breath or earlier if your symptoms get worse or not improved. As always, if having severe symptoms, please seek medical attention at emergency room. Should you have any questions or concerns please call the internal medicine clinic at [phone].    Thank you!

## 2020-06-30 NOTE — Progress Notes (Signed)
SATURATION QUALIFICATIONS: (This note is used to comply with regulatory documentation for home oxygen)  Patient Saturations on Room Air at Rest = 90%  Patient Saturations on Room Air while Ambulating = 84%  Patient Saturations on 3 Liters of oxygen while Ambulating = 94% HR-84  Please briefly explain why patient needs home oxygen:

## 2020-06-30 NOTE — Progress Notes (Signed)
   CC: SOB  HPI:  [name] is a 64 y.o. female with PMHx as documented below, presented with SOB and oxygen requirement. Please refer to problem based charting for further details and assessment and plan of current problem and chronic medical conditions.  Past Medical History:  Diagnosis Date  . Anxiety   . Asthma    Chronic, with restrictive component 2/2 obesity  . Depression   . GERD (gastroesophageal reflux disease)   . Headache   . Insomnia   . Obesity   . On home oxygen therapy    "3L at night only when I get sick" (04/30/2014)  . Shortness of breath dyspnea   . Sleep apnea    "couldn't afford mask so I didn't get it" (04/30/2014)   Review of Systems:   Review of Systems  Constitutional: Negative for chills and fever.  Respiratory: Positive for shortness of breath.    Physical Exam:  Vitals:   06/30/20 1418  BP: 134/77  Pulse: 90  Temp: 97.9 F (36.6 C)  TempSrc: Oral  SpO2: 90%  Weight: 263 lb 8 oz (119.5 kg)  Height: 5\' 8"  (1.727 m)  Vital sign reviewed. Constitutional: Obese lady, in no acute distress.  HENT:  Head: Normocephalic and atraumatic.  Eyes: Conjunctivae are normal, EOM nl Cardiovascular:  RRR, nl S1S2, no murmur,  no LEE Respiratory: Effort normal and breath sounds normal. No respiratory distress. No wheezes.  GI: Soft. Bowel sounds are normal. No distension. There is no tenderness.  Neurological: Is alert and oriented x 3  Skin: Not diaphoretic. No erythema.  Psychiatric: Normal mood and affect. Behavior is normal. Judgment and thought content normal.    Assessment & Plan:   See Encounters Tab for problem based charting.  Patient discussed with Dr. Evette [name]

## 2020-07-01 ENCOUNTER — Encounter: Payer: Self-pay | Admitting: Internal Medicine

## 2020-07-01 NOTE — Assessment & Plan Note (Addendum)
Patient presented to be assessed for home supplemental oxygen requirement. Per last ABG and reported pulse ox at home, she has chronic hypoxia and mentions that she was using supplemental oxygen as needed at home long time ago (may be several years ago). But she stopped using that for past few years because she did not feel she needes that. She reports shortness of breath with activity. She is doing okay at home because she is able to get some rest after gets short of breath with activity but when she is uncomfortable due to shortness of breath she goes to grocery store.   She has pulse oxymeter at home and mentions that at night, when she goes to bathroom, her O2 sat drops to 70s sometime.    Today, we checked O2 saturation with ambulation and with and without supplemental O2:  O2 saturation at rest on room air was 90%. It dropped to 84% with ambulation. Her O2 saturation improved to 94% with 3 L of supplemental nasal oxygen while ambulating.  Assessment and plan: Patient with chronic hypoxia.  Continue multifactorial in setting of OSA, presumed OHS, obstructive pulmonary disease.  However, there is no recent work up in performed for exact diagnosis.  She has Hx of OSA (No recent sleep study. The last one obtained at 2014). She does not use C-PAP machine because she was not able to afford it when it was ordered.  Also OHS is mentioned in her problem list. Her BMI is 40 but her day time ABG does not show significant daytime hypercapnia to confirm the diagnosis. She will still take benefit of CPAP given OSA.  -Patient with chronic hypoxia, has significant drop in her oxygen saturation with ambulation and will require home oxygen supply for ambulation. -Ordered portable oxygen supply -Sleep study -PFT

## 2020-07-01 NOTE — Telephone Encounter (Signed)
Gilda with [employer] called requesting O2 order be updated to state stationary concentrator in addition to POC. Hubbard Hartshorn, BSN, RN-BC

## 2020-07-01 NOTE — Telephone Encounter (Signed)
CM sent Eduardo Osier at [facility] for [place] [facility]). F2F with qualifying O2 sats was yesterday. Hubbard Hartshorn, BSN, RN-BC

## 2020-07-01 NOTE — Assessment & Plan Note (Signed)
Patient is here for follow up of rib pain 2/2 fall. She still has left side rib pain. Prior CXR and chest CT not show any fracture acute pathology. She has mild tenderness on her left posterior and anterior ribs under her breast.  No significant tenderness that makes me think of repeating imaging.  She asked Tylenol for that with some relief. Robaxin did not help that much.  I explained that this is likely musculoskeletal pain.  There was no evidence of fracture on prior imaging.  It may take a few weeks to feel completely better.  We we will do conservative management.  -Sending refill for lidocaine patch.  Can continue Tylenol as needed for pain

## 2020-07-01 NOTE — Assessment & Plan Note (Addendum)
Patient was evaluated today. His SOB addressed and documented under "chronic respiratory failure with hypoxia" in problem list.

## 2020-07-02 ENCOUNTER — Other Ambulatory Visit: Payer: Self-pay | Admitting: Internal Medicine

## 2020-07-02 DIAGNOSIS — J9610 Chronic respiratory failure, unspecified whether with hypoxia or hypercapnia: Secondary | ICD-10-CM | POA: Diagnosis not present

## 2020-07-02 DIAGNOSIS — J9611 Chronic respiratory failure with hypoxia: Secondary | ICD-10-CM

## 2020-07-02 DIAGNOSIS — J449 Chronic obstructive pulmonary disease, unspecified: Secondary | ICD-10-CM | POA: Diagnosis not present

## 2020-07-02 NOTE — Progress Notes (Signed)
Internal Medicine Clinic Attending  Case discussed with Dr. Masoudi  At the time of the visit.  We reviewed the resident's history and exam and pertinent patient test results.  I agree with the assessment, diagnosis, and plan of care documented in the resident's note.  

## 2020-07-02 NOTE — Progress Notes (Signed)
Ordering stationary O2 for home use.

## 2020-07-06 ENCOUNTER — Telehealth: Payer: Self-pay | Admitting: *Deleted

## 2020-07-06 NOTE — Telephone Encounter (Signed)
Talked to [facility], about PFT's appt. She had called the pt, appt schedule 9/23@ 1400PM. Also stated pt stated she will arrange covid testing herself and bring result to the appt.

## 2020-07-06 NOTE — Telephone Encounter (Signed)
Agree, thank you

## 2020-07-24 DIAGNOSIS — Z20822 Contact with and (suspected) exposure to covid-19: Secondary | ICD-10-CM | POA: Diagnosis not present

## 2020-07-30 ENCOUNTER — Inpatient Hospital Stay (HOSPITAL_COMMUNITY): Admission: RE | Admit: 2020-07-30 | Payer: Medicare HMO | Source: Ambulatory Visit

## 2020-07-30 ENCOUNTER — Encounter: Payer: Medicare HMO | Admitting: Internal Medicine

## 2020-08-02 DIAGNOSIS — J9610 Chronic respiratory failure, unspecified whether with hypoxia or hypercapnia: Secondary | ICD-10-CM | POA: Diagnosis not present

## 2020-08-07 ENCOUNTER — Encounter: Payer: Self-pay | Admitting: Student

## 2020-08-07 ENCOUNTER — Ambulatory Visit (INDEPENDENT_AMBULATORY_CARE_PROVIDER_SITE_OTHER): Payer: Medicare HMO | Admitting: Student

## 2020-08-07 ENCOUNTER — Other Ambulatory Visit: Payer: Self-pay

## 2020-08-07 ENCOUNTER — Ambulatory Visit (HOSPITAL_COMMUNITY)
Admission: RE | Admit: 2020-08-07 | Discharge: 2020-08-07 | Disposition: A | Discharge status: Home / Self Care | Payer: Medicare HMO | Source: Ambulatory Visit | Attending: Internal Medicine | Admitting: Internal Medicine

## 2020-08-07 VITALS — BP 135/63 | HR 75 | Temp 98.1°F | Ht 68.0 in | Wt 264.8 lb

## 2020-08-07 DIAGNOSIS — L03012 Cellulitis of left finger: Secondary | ICD-10-CM

## 2020-08-07 DIAGNOSIS — W010XXA Fall on same level from slipping, tripping and stumbling without subsequent striking against object, initial encounter: Secondary | ICD-10-CM | POA: Diagnosis not present

## 2020-08-07 DIAGNOSIS — W1830XA Fall on same level, unspecified, initial encounter: Secondary | ICD-10-CM

## 2020-08-07 DIAGNOSIS — M79642 Pain in left hand: Secondary | ICD-10-CM | POA: Insufficient documentation

## 2020-08-07 MED ORDER — DOXYCYCLINE HYCLATE 50 MG PO CAPS: 100.0000 mg | ORAL_CAPSULE | Freq: Two times a day (BID) | ORAL | 0 refills | Status: AC

## 2020-08-07 NOTE — Patient Instructions (Signed)
It was a pleasure seeing you in clinic.  I have prescribed you doxycyline for 5 days a for the swelling and pain in your hand. Please go to the emergency room if you develop worsening redness and swelling in you hand and you have difficulty moving you fingers.  I have also ordered xrays of you hand and a CT head to check for fracture and bleeding. I will call you if the results are abnormal.  If you have any questions or concerns, please call our clinic at [phone] between 9am-5pm and after hours call [phone] and ask for the internal medicine resident on call. If you feel you are having a medical emergency please call 911.   Thank you, we look forward to helping you remain healthy!

## 2020-08-08 DIAGNOSIS — L03012 Cellulitis of left finger: Secondary | ICD-10-CM | POA: Insufficient documentation

## 2020-08-08 NOTE — Assessment & Plan Note (Signed)
Patient reports falling 2 days ago states she was walking from the living room to kitchen attempting to turn on light, tripped on dog and fell to ground hitting the left side of her head and hand.she denies any precipitating sx prior to fall including cp, dizziness, syncope, weakness. Pt was able to get up from fall w/ help from husband. She did not seek medical care after her fall. States since her fall she has developed bruising with pain around her left eye and behind her left ear. Also notes that she has pain in the MCP joints of 2nd and 3rd l hand. Pain in left hand was gettting better but today has developed swelling and redness. She has been taking tylenol for pain. She has feeling well otherwise and denies LOC, neurologic changes, AMS since the fall. On exam, significant bruising of her left orbit and left ear. Eye mvmt preserved w/o pain. No visual disturbances. Low suspicion for orbital fracture. Will order head CT and L hand xray to r/o fracture and subdural bleeding.

## 2020-08-08 NOTE — Assessment & Plan Note (Signed)
Patient with left hand swelling and redness and laceration aster fall 2 days ago. Was improving but today has developed acute redness and swelling in the 1st and 2nd MCP. No fevers. Exam showing tenderness, swelling and redness of left hand with multiple non bleeding 1 cm lacerations. with normal range of motion, neurovascularly intact. Concern for cellulitis given acute increase in redness and swelling 2 days after inciting event.  -doxycline 100 mg BID for 5 days. instructed to go to ED if swelling and redness in hand worsens or if she delevops systemic symptoms.

## 2020-08-08 NOTE — Progress Notes (Signed)
CC: Fall  HPI:  [name] [name] is a 64 y.o. with past hx listed below. Presented to clinic after fall 2 days ago with trauma to left side of head and left fingers. Please refer to problem based charting for further details and assessment and plan of current problem and chronic medical conditions.    Past Medical History:  Diagnosis Date  . Anxiety   . Asthma    Chronic, with restrictive component 2/2 obesity  . Depression   . GERD (gastroesophageal reflux disease)   . Headache   . Insomnia   . Obesity   . On home oxygen therapy    "3L at night only when I get sick" (04/30/2014)  . Shortness of breath dyspnea   . Sleep apnea    "couldn't afford mask so I didn't get it" (04/30/2014)   Review of Systems:  Review of Systems  Constitutional: Negative for chills and fever.  HENT: Negative for hearing loss. Ear pain: w/ swelling.   Eyes: Negative for blurred vision, double vision and pain.       Bruising around L orbit  Respiratory: Negative for cough, shortness of breath and wheezing.   Cardiovascular: Negative for chest pain and palpitations.  Gastrointestinal: Negative for abdominal pain, nausea and vomiting.  Genitourinary: Negative for dysuria and flank pain.  Musculoskeletal: Positive for falls. Negative for neck pain.  Skin: Negative for rash.  Neurological: Negative for dizziness, tingling, sensory change, focal weakness, loss of consciousness and headaches.  Psychiatric/Behavioral: Negative for depression and hallucinations.    Physical Exam:  Vitals:   08/07/20 1455  BP: 135/63  Pulse: 75  Temp: 98.1 F (36.7 C)  TempSrc: Oral  SpO2: 90%  Weight: 264 lb 12.8 oz (120.1 kg)  Height: 5\' 8"  (1.727 m)   Physical Exam Vitals reviewed.  Constitutional:      General: She is not in acute distress. HENT:     Head:     Comments: Ecchymosis around left orbit extending to left temple w/ swelling and tender to palpation.     Right Ear: External ear normal.      Ears:     Comments: warmth swelling and redness of left pinna with laceration behind ear with no active bleeding     Nose: Nose normal.     Mouth/Throat:     Mouth: Mucous membranes are moist.  Eyes:     Extraocular Movements: Extraocular movements intact.     Pupils: Pupils are equal, round, and reactive to light.     Comments: No proptosis or endopthalmos  Cardiovascular:     Rate and Rhythm: Normal rate and regular rhythm.     Pulses: Normal pulses.     Heart sounds: Normal heart sounds.  Pulmonary:     Effort: Pulmonary effort is normal.     Breath sounds: Normal breath sounds.  Abdominal:     General: Abdomen is flat. Bowel sounds are normal.     Palpations: Abdomen is soft.  Musculoskeletal:     Cervical back: No rigidity or tenderness.     Comments: Swelling and redness around 1st and 2nd left MCP. ROM intact. Neurovascularly intact  Lymphadenopathy:     Cervical: No cervical adenopathy.  Skin:    Capillary Refill: Capillary refill takes less than 2 seconds.     Comments: Linear laceration to 2nd, 3rd, and 4th left fingers between MCP and PIP. No active bleeding. Surrounding erythema on skin. No warmth, no drainage  Neurological:  General: No focal deficit present.     Mental Status: She is alert and oriented to person, place, and time.      Assessment & Plan:   See Encounters Tab for problem based charting.  Patient seen with Dr. Dareen Piano

## 2020-08-13 NOTE — Progress Notes (Signed)
Internal Medicine Clinic Attending  I saw and evaluated the patient.  I personally confirmed the key portions of the history and exam documented by Dr. Liang and I reviewed pertinent patient test results.  The assessment, diagnosis, and plan were formulated together and I agree with the documentation in the resident's note.  

## 2020-08-14 ENCOUNTER — Other Ambulatory Visit: Payer: Self-pay | Admitting: Internal Medicine

## 2020-08-17 ENCOUNTER — Telehealth: Payer: Self-pay | Admitting: Student

## 2020-08-17 NOTE — Telephone Encounter (Signed)
Attempted to contact patient this afternoon for an appointment at the request of Dr. Lisabeth Devoid, but no answer.  Left detailed message asking patient to please return my phone call.

## 2020-08-17 NOTE — Telephone Encounter (Signed)
Patient called to regarding left hand xray and discussed results. She states that she finished antibiotic course with improved swelling and redness in her left hand. Srates she continues to have pain and swelling in the hand but denies fever and chills or worsening symptoms. Advised patient to follow up in clinic to reevaluate her hand given she continues to have swelling and pain in the setting of possible foreign body on imaging. Patient understandings is agree able to making a follow up appointment.

## 2020-08-17 NOTE — Progress Notes (Signed)
Thanks [name]! I think we may have to refer her to hand surgery for foreign body removal

## 2020-08-18 ENCOUNTER — Ambulatory Visit (HOSPITAL_COMMUNITY): Payer: Medicare HMO

## 2020-09-01 DIAGNOSIS — J9610 Chronic respiratory failure, unspecified whether with hypoxia or hypercapnia: Secondary | ICD-10-CM | POA: Diagnosis not present

## 2020-09-04 IMAGING — DX DG CHEST 2V
2 series · 2 of 2 positions shown · non-contrast
Comparison: 09/12/2018

CLINICAL DATA: 61-year-old female with a history of shortness of
breath

EXAM:
CHEST - 2 VIEW

[chest pa]
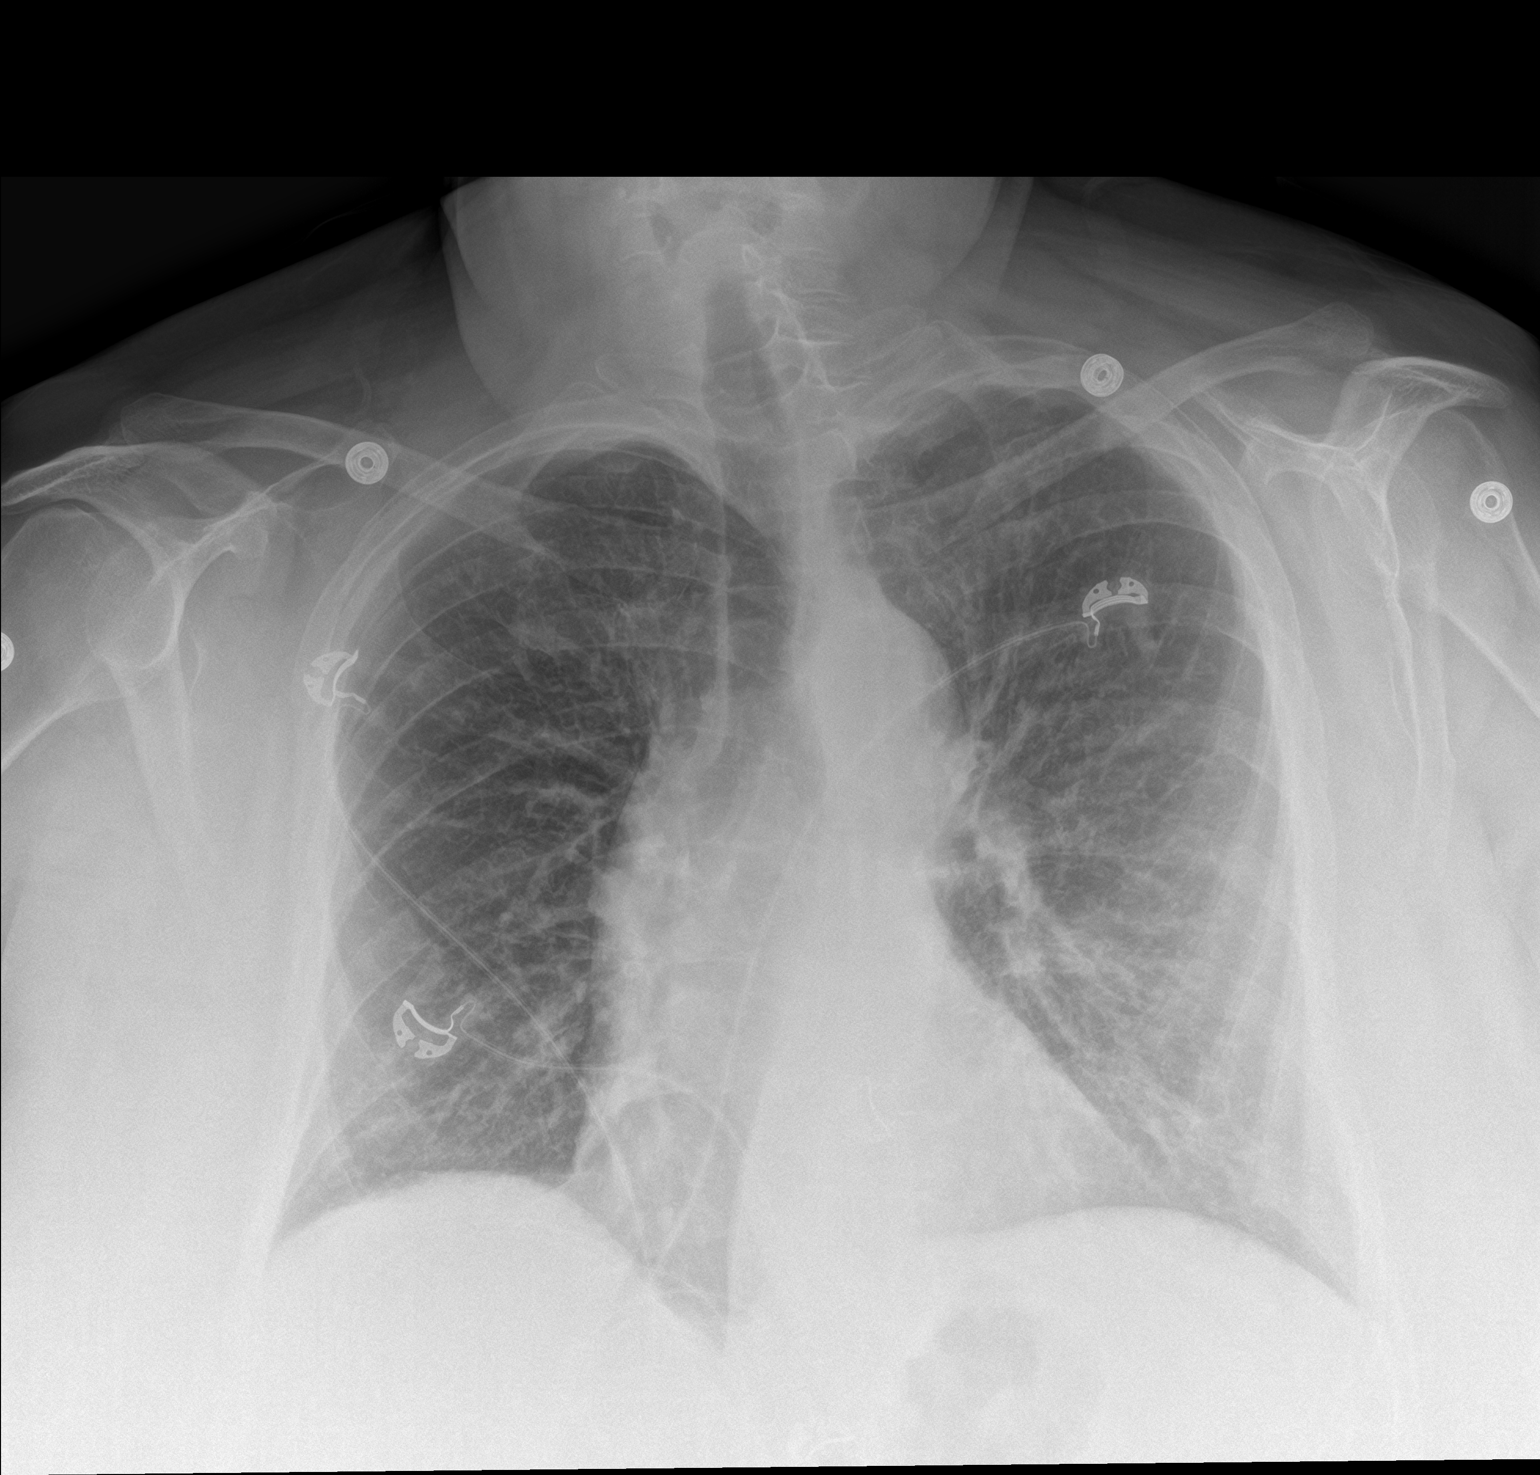

[chest lat]
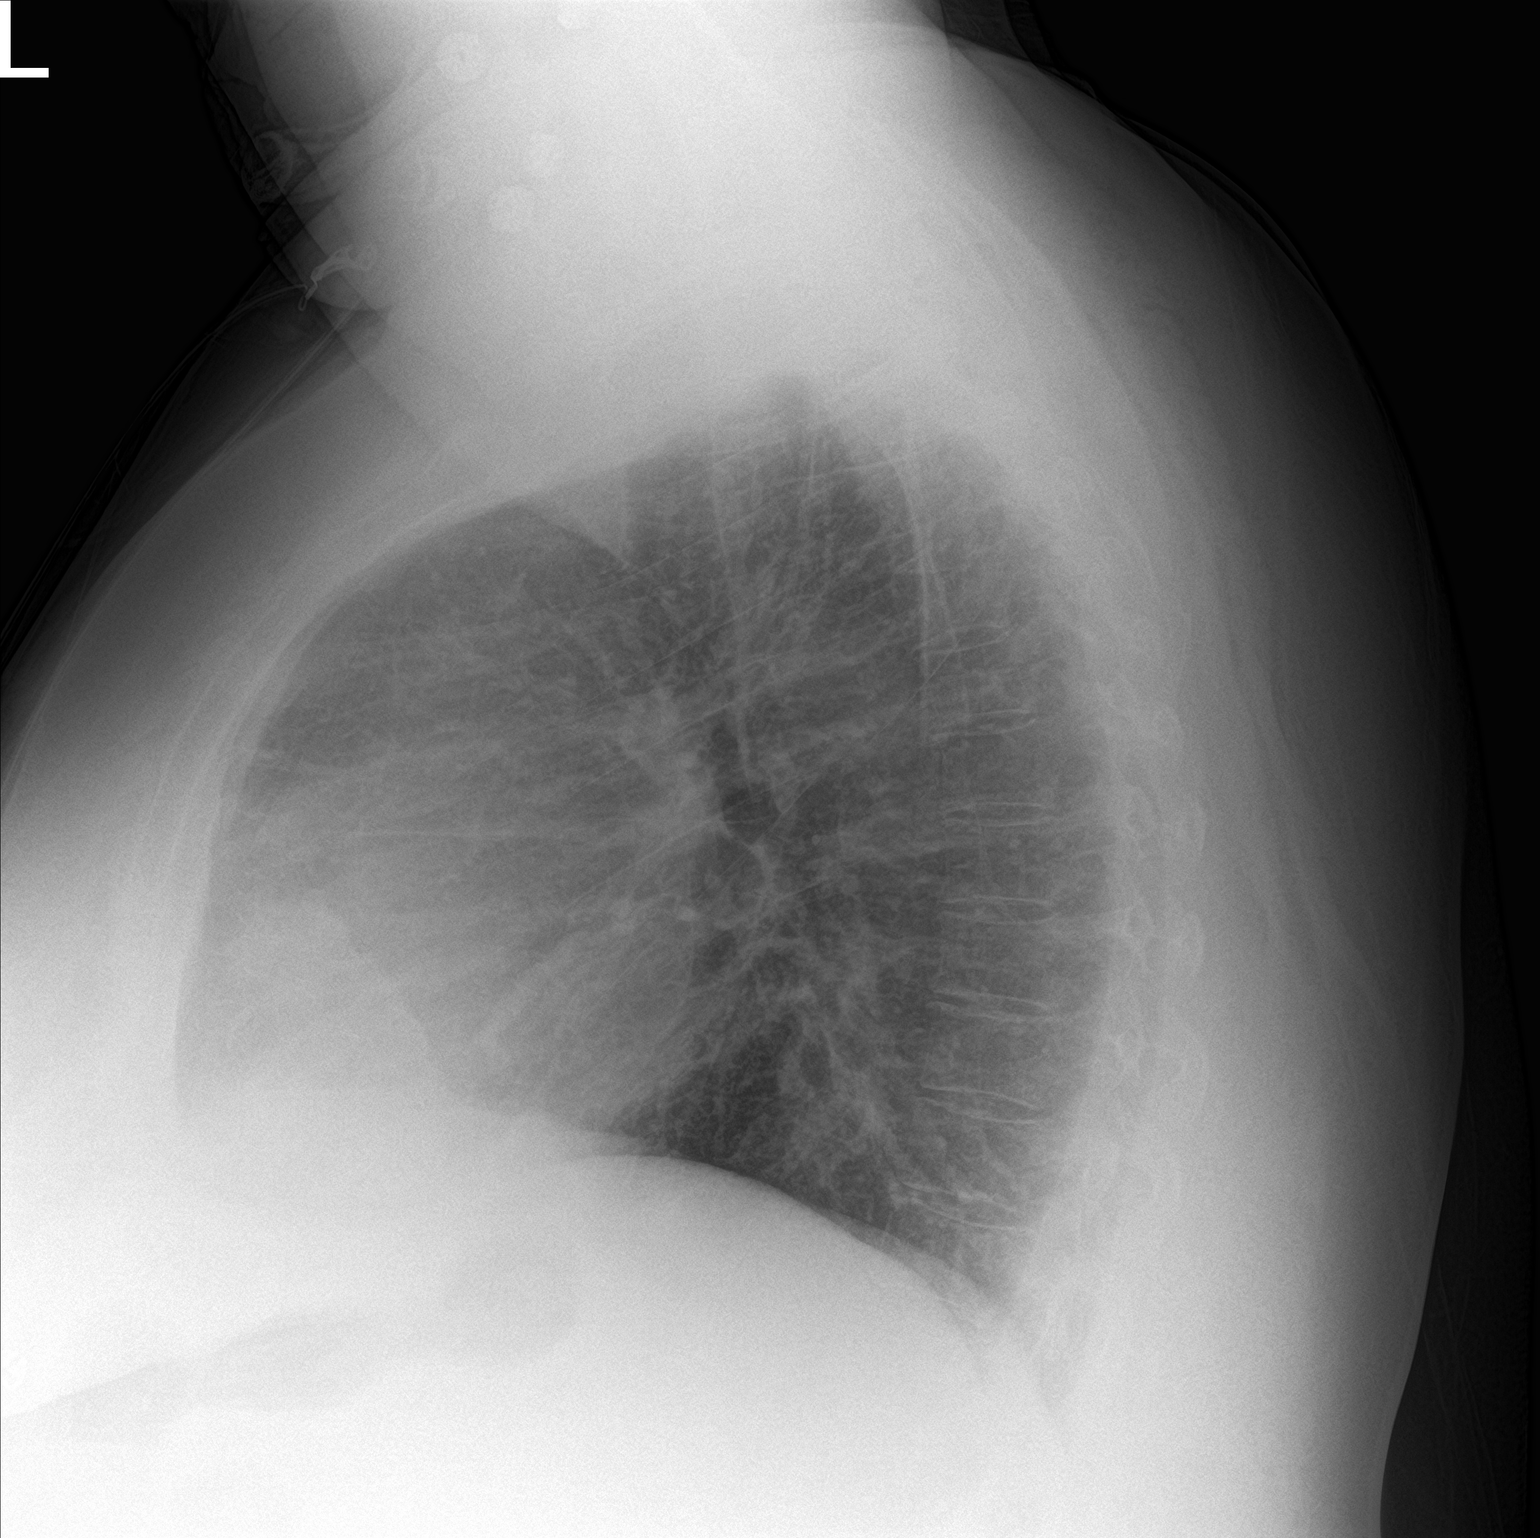

[2 of 2 positions shown; findings below may reference images not displayed]

FINDINGS: Cardiomediastinal silhouette unchanged in size and contour. No
evidence of central vascular congestion. Reticular pattern of
opacity in the left greater than right lower lungs. No pneumothorax.

No pleural effusion.

Similar appearance of scoliotic curvature of the spine.
IMPRESSION: Reticular opacity of the lower lungs, more pronounced than the
comparison, may reflect new bronchitis or atypical infection.

## 2020-10-02 DIAGNOSIS — J9610 Chronic respiratory failure, unspecified whether with hypoxia or hypercapnia: Secondary | ICD-10-CM | POA: Diagnosis not present

## 2020-11-01 DIAGNOSIS — J9610 Chronic respiratory failure, unspecified whether with hypoxia or hypercapnia: Secondary | ICD-10-CM | POA: Diagnosis not present

## 2020-11-18 ENCOUNTER — Other Ambulatory Visit: Payer: Self-pay

## 2020-11-18 ENCOUNTER — Ambulatory Visit (INDEPENDENT_AMBULATORY_CARE_PROVIDER_SITE_OTHER): Payer: Medicare HMO | Admitting: Physician Assistant

## 2020-11-18 ENCOUNTER — Encounter: Payer: Self-pay | Admitting: Physician Assistant

## 2020-11-18 DIAGNOSIS — F411 Generalized anxiety disorder: Secondary | ICD-10-CM

## 2020-11-18 DIAGNOSIS — F3342 Major depressive disorder, recurrent, in full remission: Secondary | ICD-10-CM

## 2020-11-18 DIAGNOSIS — F429 Obsessive-compulsive disorder, unspecified: Secondary | ICD-10-CM | POA: Diagnosis not present

## 2020-11-18 DIAGNOSIS — G4700 Insomnia, unspecified: Secondary | ICD-10-CM | POA: Diagnosis not present

## 2020-11-18 MED ORDER — ALPRAZOLAM 1 MG PO TABS: 0.5000 mg | ORAL_TABLET | Freq: Two times a day (BID) | ORAL | 5 refills | Status: DC | PRN

## 2020-11-18 MED ORDER — SERTRALINE HCL 100 MG PO TABS: 300.0000 mg | ORAL_TABLET | Freq: Every day | ORAL | 3 refills | Status: DC

## 2020-11-18 MED ORDER — TRAZODONE HCL 150 MG PO TABS: 150.0000 mg | ORAL_TABLET | Freq: Every evening | ORAL | 3 refills | Status: DC | PRN

## 2020-11-18 NOTE — Progress Notes (Signed)
[facility]  Patient ID: [name] [name],  MRN: [id number]  PCP: Iona Beard, MD  Date of Evaluation: 11/18/2020  Time spent:30 minutes  Chief Complaint:  Chief Complaint    Anxiety; Depression; Insomnia      HISTORY/CURRENT STATUS: HPI For routine med check.   Fell on ice a month ago, wasn't seriously hurt but is having to use a walking cane now. Hit her head but no LOC. Did have a headache for days but it's better now.   As far as her mood goes, she's doing well. Able to enjoy things, energy and motivation are good, not isolating, not crying easily. Is kind of worried b/c her husband is having to see a surgeon because of an abnormal home test that he did in place of having a colonoscopy.  Also her son has a lesion on his face I believe, it was aspirated recently in its being sent off to make sure it is not cancer.  She is understandably concerned about those things but overall the anxiety is controlled.  She sleeps well as long as she takes the trazodone.  Denies suicidal or homicidal thoughts  Anxiety is about the same, no worse or better.  The Xanax does help.  It is still worse when she rides in a car, when she would feel panicky.  The medications have helped decrease the symptoms.  Ruminating thoughts are controlled most of the time.  If something has happened with her family for example she does think about things over and over, such as the medical problems that her husband and son possibly had.  Patient denies increased energy with decreased need for sleep, no increased talkativeness, no racing thoughts, no impulsivity or risky behaviors, no increased spending, no increased libido, no grandiosity, no paranoia, no hallucinations.  Denies dizziness, syncope, seizures, numbness, tingling, tremor, tics, unsteady gait, slurred speech, confusion.   Individual Medical History/ Review of Systems: Changes? :No    Past medications for mental health diagnoses  include: Wellbutrin, Xanax, Zoloft, trazodone, gabapentin, BuSpar, Ativan, Klonopin  Allergies: Ibuprofen and Clonazepam  Current Medications:  Current Outpatient Medications:  .  acetaminophen (TYLENOL) 500 MG tablet, Take 1,000 mg by mouth every 6 (six) hours as needed (for pain OR back pain). , Disp: , Rfl:  .  albuterol (VENTOLIN HFA) 108 (90 Base) MCG/ACT inhaler, Inhale 2 puffs into the lungs every 6 (six) hours as needed for wheezing or shortness of breath., Disp: 8 g, Rfl: 5 .  atorvastatin (LIPITOR) 10 MG tablet, Take 1 tablet (10 mg total) by mouth daily., Disp: 90 tablet, Rfl: 3 .  famotidine (PEPCID) 10 MG tablet, Take 1 tablet (10 mg total) by mouth 2 (two) times daily. May increase to 2 pills twice a day if no improvement after 2 weeks, Disp: 60 tablet, Rfl: 3 .  fluticasone furoate-vilanterol (BREO ELLIPTA) 100-25 MCG/INH AEPB, Inhale 1 puff into the lungs daily., Disp: 28 each, Rfl: 5 .  meloxicam (MOBIC) 7.5 MG tablet, TAKE 1 TABLET BY MOUTH DAILY AS NEEDED, Disp: 90 tablet, Rfl: 0 .  methocarbamol (ROBAXIN) 500 MG tablet, Take 1 tablet (500 mg total) by mouth every 8 (eight) hours as needed for muscle spasms., Disp: 30 tablet, Rfl: 0 .  pantoprazole (PROTONIX) 20 MG tablet, Take 1 tablet (20 mg total) by mouth daily., Disp: 90 tablet, Rfl: 1 .  ALPRAZolam (XANAX) 1 MG tablet, Take 0.5-1 tablets (0.5-1 mg total) by mouth 2 (two) times daily as needed. for anxiety, Disp:  60 tablet, Rfl: 5 .  nystatin (MYCOSTATIN/NYSTOP) 100000 UNIT/GM POWD, Please apply to skin under breasts twice daily until rash resolves. (Patient not taking: No sig reported), Disp: 30 g, Rfl: 1 .  sertraline (ZOLOFT) 100 MG tablet, Take 3 tablets (300 mg total) by mouth daily., Disp: 270 tablet, Rfl: 3 .  traZODone (DESYREL) 150 MG tablet, Take 1 tablet (150 mg total) by mouth at bedtime as needed for sleep., Disp: 90 tablet, Rfl: 3 Medication Side Effects: none  Family Medical/ Social History: Changes?  No  MENTAL HEALTH EXAM:  There were no vitals taken for this visit.There is no height or weight on file to calculate BMI.  General Appearance: Casual, Neat, Well Groomed and Obese  Eye Contact:  Good  Speech:  Clear and Coherent and Normal Rate  Volume:  Normal  Mood:  Euthymic  Affect:  Appropriate  Thought Process:  Goal Directed and Descriptions of Associations: Intact  Orientation:  Full (Time, Place, and Person)  Thought Content: Logical   Suicidal Thoughts:  No  Homicidal Thoughts:  No  Memory:  WNL  Judgement:  Good  Insight:  Good  Psychomotor Activity:  She is walking slowly with a cane.  Concentration:  Concentration: Good and Attention Span: Good  Recall:  Good  Fund of Knowledge: Good  Language: Good  Assets:  Desire for Improvement  ADL's:  Intact  Cognition: WNL  Prognosis:  Good   Most recent labs: 06/03/2020 CBC White count was 7.4, hemoglobin 15.7, hematocrit 48.1, platelet count 127. BMP sodium 139, potassium 4.1, glucose 110, BUN 7, creatinine 0.59, calcium 9.4 LFTs were normal.  DIAGNOSES:    ICD-10-CM   1. Generalized anxiety disorder  F41.1   2. Recurrent major depressive disorder, in full remission ([place])  F33.42   3. Insomnia, unspecified type  G47.00   4. Obsessive-compulsive disorder, unspecified type  F42.9     Receiving Psychotherapy: No    RECOMMENDATIONS:  PDMP was reviewed. I provided 30 minutes of face-to-face time during this encounter, during which we discussed her diagnosis and treatment, as well as before and after the visit review of chart.  Labs since her last visit were reviewed and noted above. She is doing well as far as her medications are concerned therefore there will be no medication changes. Continue Xanax 1 mg, 1/2-1 twice daily as needed. Continue Zoloft 100 mg, 3 p.o. daily. Continue trazodone 150 mg, 1 p.o. nightly as needed sleep. Return in 6 months.  Donnal Moat, PA-C

## 2020-11-27 ENCOUNTER — Other Ambulatory Visit: Payer: Medicare HMO

## 2020-11-27 ENCOUNTER — Other Ambulatory Visit: Payer: Self-pay

## 2020-11-27 DIAGNOSIS — Z20822 Contact with and (suspected) exposure to covid-19: Secondary | ICD-10-CM

## 2020-11-29 LAB — SPECIMEN STATUS REPORT

## 2020-11-29 LAB — NOVEL CORONAVIRUS, NAA: SARS-CoV-2, NAA: DETECTED — AB

## 2020-11-29 LAB — SARS-COV-2, NAA 2 DAY TAT

## 2020-11-30 ENCOUNTER — Telehealth: Payer: Self-pay

## 2020-11-30 NOTE — Telephone Encounter (Signed)
Patient called in stating she tested positive for covid 2 days ago and was told she should have [facility] infusion. States she feels awful. C/o "terrible cough" that is non-productive. Cold sweats, diarrhea, "every part of my body aches." Denies fever. States she's concerned because she is on oxygen (3 L). When asked what her O2 sat is she reported 83 % on 3L. Advised her to call 911 immediately for transport to ED. States he will have her husband drive her now. Hubbard Hartshorn, BSN, RN-BC

## 2020-11-30 NOTE — Telephone Encounter (Signed)
Agree  Thank you

## 2020-11-30 NOTE — Telephone Encounter (Signed)
Pt states she tested positive for COVID, requesting to go to the infusion clinic.

## 2020-12-01 ENCOUNTER — Other Ambulatory Visit: Payer: Self-pay

## 2020-12-01 ENCOUNTER — Inpatient Hospital Stay (HOSPITAL_COMMUNITY)
Admission: EM | Admit: 2020-12-01 | Discharge: 2020-12-05 | DRG: 177 | Disposition: A | Discharge status: Home Health | Payer: Medicare HMO | Attending: Internal Medicine | Admitting: Internal Medicine

## 2020-12-01 ENCOUNTER — Emergency Department (HOSPITAL_COMMUNITY): Payer: Medicare HMO

## 2020-12-01 DIAGNOSIS — J9621 Acute and chronic respiratory failure with hypoxia: Secondary | ICD-10-CM | POA: Diagnosis present

## 2020-12-01 DIAGNOSIS — E785 Hyperlipidemia, unspecified: Secondary | ICD-10-CM | POA: Diagnosis present

## 2020-12-01 DIAGNOSIS — R9431 Abnormal electrocardiogram [ECG] [EKG]: Secondary | ICD-10-CM | POA: Diagnosis not present

## 2020-12-01 DIAGNOSIS — F32A Depression, unspecified: Secondary | ICD-10-CM | POA: Diagnosis present

## 2020-12-01 DIAGNOSIS — G4733 Obstructive sleep apnea (adult) (pediatric): Secondary | ICD-10-CM | POA: Diagnosis present

## 2020-12-01 DIAGNOSIS — R0689 Other abnormalities of breathing: Secondary | ICD-10-CM | POA: Diagnosis not present

## 2020-12-01 DIAGNOSIS — I452 Bifascicular block: Secondary | ICD-10-CM | POA: Diagnosis present

## 2020-12-01 DIAGNOSIS — Z90710 Acquired absence of both cervix and uterus: Secondary | ICD-10-CM

## 2020-12-01 DIAGNOSIS — D6959 Other secondary thrombocytopenia: Secondary | ICD-10-CM | POA: Diagnosis present

## 2020-12-01 DIAGNOSIS — R0902 Hypoxemia: Secondary | ICD-10-CM | POA: Diagnosis not present

## 2020-12-01 DIAGNOSIS — Z6841 Body Mass Index (BMI) 40.0 and over, adult: Secondary | ICD-10-CM

## 2020-12-01 DIAGNOSIS — I517 Cardiomegaly: Secondary | ICD-10-CM | POA: Diagnosis not present

## 2020-12-01 DIAGNOSIS — J440 Chronic obstructive pulmonary disease with acute lower respiratory infection: Secondary | ICD-10-CM | POA: Diagnosis present

## 2020-12-01 DIAGNOSIS — F419 Anxiety disorder, unspecified: Secondary | ICD-10-CM | POA: Diagnosis present

## 2020-12-01 DIAGNOSIS — J1282 Pneumonia due to coronavirus disease 2019: Secondary | ICD-10-CM | POA: Diagnosis present

## 2020-12-01 DIAGNOSIS — Z886 Allergy status to analgesic agent status: Secondary | ICD-10-CM

## 2020-12-01 DIAGNOSIS — Z803 Family history of malignant neoplasm of breast: Secondary | ICD-10-CM

## 2020-12-01 DIAGNOSIS — R197 Diarrhea, unspecified: Secondary | ICD-10-CM | POA: Diagnosis present

## 2020-12-01 DIAGNOSIS — I700 Atherosclerosis of aorta: Secondary | ICD-10-CM | POA: Diagnosis present

## 2020-12-01 DIAGNOSIS — R0602 Shortness of breath: Secondary | ICD-10-CM | POA: Diagnosis not present

## 2020-12-01 DIAGNOSIS — I4510 Unspecified right bundle-branch block: Secondary | ICD-10-CM | POA: Diagnosis not present

## 2020-12-01 DIAGNOSIS — J9611 Chronic respiratory failure with hypoxia: Secondary | ICD-10-CM | POA: Diagnosis not present

## 2020-12-01 DIAGNOSIS — K219 Gastro-esophageal reflux disease without esophagitis: Secondary | ICD-10-CM | POA: Diagnosis present

## 2020-12-01 DIAGNOSIS — Z9981 Dependence on supplemental oxygen: Secondary | ICD-10-CM

## 2020-12-01 DIAGNOSIS — Z87891 Personal history of nicotine dependence: Secondary | ICD-10-CM

## 2020-12-01 DIAGNOSIS — Z888 Allergy status to other drugs, medicaments and biological substances status: Secondary | ICD-10-CM | POA: Diagnosis not present

## 2020-12-01 DIAGNOSIS — D72819 Decreased white blood cell count, unspecified: Secondary | ICD-10-CM | POA: Diagnosis present

## 2020-12-01 DIAGNOSIS — J9610 Chronic respiratory failure, unspecified whether with hypoxia or hypercapnia: Secondary | ICD-10-CM | POA: Diagnosis not present

## 2020-12-01 DIAGNOSIS — R0600 Dyspnea, unspecified: Secondary | ICD-10-CM | POA: Diagnosis not present

## 2020-12-01 DIAGNOSIS — J984 Other disorders of lung: Secondary | ICD-10-CM

## 2020-12-01 DIAGNOSIS — Z825 Family history of asthma and other chronic lower respiratory diseases: Secondary | ICD-10-CM

## 2020-12-01 DIAGNOSIS — J45909 Unspecified asthma, uncomplicated: Secondary | ICD-10-CM | POA: Diagnosis not present

## 2020-12-01 DIAGNOSIS — R809 Proteinuria, unspecified: Secondary | ICD-10-CM | POA: Diagnosis present

## 2020-12-01 DIAGNOSIS — Z79899 Other long term (current) drug therapy: Secondary | ICD-10-CM | POA: Diagnosis not present

## 2020-12-01 DIAGNOSIS — U071 COVID-19: Secondary | ICD-10-CM | POA: Diagnosis present

## 2020-12-01 DIAGNOSIS — R531 Weakness: Secondary | ICD-10-CM | POA: Diagnosis not present

## 2020-12-01 DIAGNOSIS — J441 Chronic obstructive pulmonary disease with (acute) exacerbation: Secondary | ICD-10-CM | POA: Diagnosis not present

## 2020-12-01 DIAGNOSIS — J189 Pneumonia, unspecified organism: Secondary | ICD-10-CM | POA: Diagnosis not present

## 2020-12-01 DIAGNOSIS — D696 Thrombocytopenia, unspecified: Secondary | ICD-10-CM | POA: Diagnosis not present

## 2020-12-01 DIAGNOSIS — K5900 Constipation, unspecified: Secondary | ICD-10-CM | POA: Diagnosis present

## 2020-12-01 DIAGNOSIS — J80 Acute respiratory distress syndrome: Secondary | ICD-10-CM | POA: Diagnosis not present

## 2020-12-01 DIAGNOSIS — S82399S Other fracture of lower end of unspecified tibia, sequela: Secondary | ICD-10-CM | POA: Diagnosis not present

## 2020-12-01 DIAGNOSIS — R062 Wheezing: Secondary | ICD-10-CM | POA: Diagnosis not present

## 2020-12-01 LAB — COMPREHENSIVE METABOLIC PANEL
ALT: 12 U/L (ref 0–44)
AST: 19 U/L (ref 15–41)
Albumin: 3.2 g/dL — ABNORMAL LOW (ref 3.5–5.0)
Alkaline Phosphatase: 91 U/L (ref 38–126)
Anion gap: 11 (ref 5–15)
BUN: 8 mg/dL (ref 8–23)
CO2: 28 mmol/L (ref 22–32)
Calcium: 8.6 mg/dL — ABNORMAL LOW (ref 8.9–10.3)
Chloride: 99 mmol/L (ref 98–111)
Creatinine, Ser: 0.62 mg/dL (ref 0.44–1.00)
GFR, Estimated: >60 mL/min (ref >60)
Glucose, Bld: 132 mg/dL — ABNORMAL HIGH (ref 70–99)
Potassium: 3.4 mmol/L — ABNORMAL LOW (ref 3.5–5.1)
Sodium: 138 mmol/L (ref 135–145)
Total Bilirubin: 0.5 mg/dL (ref 0.3–1.2)
Total Protein: 6.1 g/dL — ABNORMAL LOW (ref 6.5–8.1)

## 2020-12-01 LAB — CBC WITH DIFFERENTIAL/PLATELET
Abs Immature Granulocytes: 0.02 10*3/uL (ref 0.00–0.07)
Basophils Absolute: 0 10*3/uL (ref 0.0–0.1)
Basophils Relative: 0 %
Eosinophils Absolute: 0 10*3/uL (ref 0.0–0.5)
Eosinophils Relative: 0 %
HCT: 47.9 % — ABNORMAL HIGH (ref 36.0–46.0)
Hemoglobin: 15.9 g/dL — ABNORMAL HIGH (ref 12.0–15.0)
Immature Granulocytes: 0 %
Lymphocytes Relative: 27 %
Lymphs Abs: 1.3 10*3/uL (ref 0.7–4.0)
MCH: 29.2 pg (ref 26.0–34.0)
MCHC: 33.2 g/dL (ref 30.0–36.0)
MCV: 88.1 fL (ref 80.0–100.0)
Monocytes Absolute: 0.5 10*3/uL (ref 0.1–1.0)
Monocytes Relative: 11 %
Neutro Abs: 2.9 10*3/uL (ref 1.7–7.7)
Neutrophils Relative %: 62 %
Platelets: UNDETERMINED 10*3/uL (ref 150–400)
RBC: 5.44 MIL/uL — ABNORMAL HIGH (ref 3.87–5.11)
RDW: 14 % (ref 11.5–15.5)
WBC: 4.8 10*3/uL (ref 4.0–10.5)
nRBC: 0 % (ref 0.0–0.2)

## 2020-12-01 LAB — LACTIC ACID, PLASMA: Lactic Acid, Venous: 0.9 mmol/L (ref 0.5–1.9)

## 2020-12-01 NOTE — ED Triage Notes (Signed)
Pt arrived by [facility] from home. Reports onset of covid symptoms x 1 week and recently tested +. Pt has hx of asthma and having increase in sob despite using her inhaler. Pt was wheezing and diminished bilateral per ems, spo2 86% on room air. Pt given 0.3 epi IM, 125mg  solumedrol IV, 2g Mg IV pta. Reports breathing has improved, spo2 95% on 6L [place].

## 2020-12-01 NOTE — ED Notes (Signed)
Pt O2 lowered to 2L/min per triage nurse, O2 sat @ 94

## 2020-12-02 ENCOUNTER — Emergency Department (HOSPITAL_COMMUNITY): Payer: Medicare HMO

## 2020-12-02 ENCOUNTER — Encounter (HOSPITAL_COMMUNITY): Payer: Self-pay | Admitting: Emergency Medicine

## 2020-12-02 ENCOUNTER — Observation Stay (HOSPITAL_COMMUNITY): Payer: Medicare HMO

## 2020-12-02 DIAGNOSIS — J1282 Pneumonia due to coronavirus disease 2019: Secondary | ICD-10-CM

## 2020-12-02 DIAGNOSIS — Z9981 Dependence on supplemental oxygen: Secondary | ICD-10-CM | POA: Diagnosis not present

## 2020-12-02 DIAGNOSIS — Z825 Family history of asthma and other chronic lower respiratory diseases: Secondary | ICD-10-CM | POA: Diagnosis not present

## 2020-12-02 DIAGNOSIS — Z79899 Other long term (current) drug therapy: Secondary | ICD-10-CM | POA: Diagnosis not present

## 2020-12-02 DIAGNOSIS — D72819 Decreased white blood cell count, unspecified: Secondary | ICD-10-CM | POA: Diagnosis present

## 2020-12-02 DIAGNOSIS — K219 Gastro-esophageal reflux disease without esophagitis: Secondary | ICD-10-CM | POA: Diagnosis present

## 2020-12-02 DIAGNOSIS — R197 Diarrhea, unspecified: Secondary | ICD-10-CM | POA: Diagnosis present

## 2020-12-02 DIAGNOSIS — G4733 Obstructive sleep apnea (adult) (pediatric): Secondary | ICD-10-CM | POA: Diagnosis present

## 2020-12-02 DIAGNOSIS — F32A Depression, unspecified: Secondary | ICD-10-CM | POA: Diagnosis present

## 2020-12-02 DIAGNOSIS — Z886 Allergy status to analgesic agent status: Secondary | ICD-10-CM | POA: Diagnosis not present

## 2020-12-02 DIAGNOSIS — D696 Thrombocytopenia, unspecified: Secondary | ICD-10-CM | POA: Diagnosis not present

## 2020-12-02 DIAGNOSIS — R809 Proteinuria, unspecified: Secondary | ICD-10-CM | POA: Diagnosis present

## 2020-12-02 DIAGNOSIS — F419 Anxiety disorder, unspecified: Secondary | ICD-10-CM | POA: Diagnosis present

## 2020-12-02 DIAGNOSIS — Z87891 Personal history of nicotine dependence: Secondary | ICD-10-CM | POA: Diagnosis not present

## 2020-12-02 DIAGNOSIS — E785 Hyperlipidemia, unspecified: Secondary | ICD-10-CM | POA: Diagnosis present

## 2020-12-02 DIAGNOSIS — J440 Chronic obstructive pulmonary disease with acute lower respiratory infection: Secondary | ICD-10-CM | POA: Diagnosis present

## 2020-12-02 DIAGNOSIS — Z6841 Body Mass Index (BMI) 40.0 and over, adult: Secondary | ICD-10-CM | POA: Diagnosis not present

## 2020-12-02 DIAGNOSIS — R9431 Abnormal electrocardiogram [ECG] [EKG]: Secondary | ICD-10-CM

## 2020-12-02 DIAGNOSIS — Z888 Allergy status to other drugs, medicaments and biological substances status: Secondary | ICD-10-CM | POA: Diagnosis not present

## 2020-12-02 DIAGNOSIS — D6959 Other secondary thrombocytopenia: Secondary | ICD-10-CM | POA: Diagnosis present

## 2020-12-02 DIAGNOSIS — U071 COVID-19: Secondary | ICD-10-CM | POA: Diagnosis present

## 2020-12-02 DIAGNOSIS — I452 Bifascicular block: Secondary | ICD-10-CM | POA: Diagnosis present

## 2020-12-02 DIAGNOSIS — J9621 Acute and chronic respiratory failure with hypoxia: Secondary | ICD-10-CM | POA: Diagnosis present

## 2020-12-02 DIAGNOSIS — J984 Other disorders of lung: Secondary | ICD-10-CM

## 2020-12-02 DIAGNOSIS — K5900 Constipation, unspecified: Secondary | ICD-10-CM | POA: Diagnosis present

## 2020-12-02 DIAGNOSIS — I700 Atherosclerosis of aorta: Secondary | ICD-10-CM | POA: Diagnosis present

## 2020-12-02 LAB — COMPREHENSIVE METABOLIC PANEL
ALT: 15 U/L (ref 0–44)
AST: 17 U/L (ref 15–41)
Albumin: 3.3 g/dL — ABNORMAL LOW (ref 3.5–5.0)
Alkaline Phosphatase: 92 U/L (ref 38–126)
Anion gap: 11 (ref 5–15)
BUN: 9 mg/dL (ref 8–23)
CO2: 26 mmol/L (ref 22–32)
Calcium: 8.9 mg/dL (ref 8.9–10.3)
Chloride: 99 mmol/L (ref 98–111)
Creatinine, Ser: 0.51 mg/dL (ref 0.44–1.00)
GFR, Estimated: >60 mL/min (ref >60)
Glucose, Bld: 167 mg/dL — ABNORMAL HIGH (ref 70–99)
Potassium: 3.8 mmol/L (ref 3.5–5.1)
Sodium: 136 mmol/L (ref 135–145)
Total Bilirubin: 0.7 mg/dL (ref 0.3–1.2)
Total Protein: 6.7 g/dL (ref 6.5–8.1)

## 2020-12-02 LAB — URINALYSIS, ROUTINE W REFLEX MICROSCOPIC
Bilirubin Urine: NEGATIVE
Glucose, UA: NEGATIVE mg/dL
Hgb urine dipstick: NEGATIVE
Ketones, ur: NEGATIVE mg/dL
Leukocytes,Ua: NEGATIVE
Nitrite: POSITIVE — AB
Protein, ur: 30 mg/dL — AB
Specific Gravity, Urine: 1.041 — ABNORMAL HIGH (ref 1.005–1.030)
pH: 5 (ref 5.0–8.0)

## 2020-12-02 LAB — CBC WITH DIFFERENTIAL/PLATELET
Abs Immature Granulocytes: 0.01 10*3/uL (ref 0.00–0.07)
Basophils Absolute: 0 10*3/uL (ref 0.0–0.1)
Basophils Relative: 1 %
Eosinophils Absolute: 0 10*3/uL (ref 0.0–0.5)
Eosinophils Relative: 0 %
HCT: 45.1 % (ref 36.0–46.0)
Hemoglobin: 15.5 g/dL — ABNORMAL HIGH (ref 12.0–15.0)
Immature Granulocytes: 1 %
Lymphocytes Relative: 25 %
Lymphs Abs: 0.4 10*3/uL — ABNORMAL LOW (ref 0.7–4.0)
MCH: 29.7 pg (ref 26.0–34.0)
MCHC: 34.4 g/dL (ref 30.0–36.0)
MCV: 86.4 fL (ref 80.0–100.0)
Monocytes Absolute: 0.1 10*3/uL (ref 0.1–1.0)
Monocytes Relative: 7 %
Neutro Abs: 1.1 10*3/uL — ABNORMAL LOW (ref 1.7–7.7)
Neutrophils Relative %: 66 %
Platelets: 120 10*3/uL — ABNORMAL LOW (ref 150–400)
RBC: 5.22 MIL/uL — ABNORMAL HIGH (ref 3.87–5.11)
RDW: 13.7 % (ref 11.5–15.5)
WBC: 1.6 10*3/uL — ABNORMAL LOW (ref 4.0–10.5)
nRBC: 0 % (ref 0.0–0.2)

## 2020-12-02 LAB — HEMOGLOBIN A1C
Hgb A1c MFr Bld: 5.7 % — ABNORMAL HIGH (ref 4.8–5.6)
Mean Plasma Glucose: 116.89 mg/dL

## 2020-12-02 LAB — FIBRINOGEN: Fibrinogen: 576 mg/dL — ABNORMAL HIGH (ref 210–475)

## 2020-12-02 LAB — TROPONIN I (HIGH SENSITIVITY)
Troponin I (High Sensitivity): 6 ng/L (ref <18)
Troponin I (High Sensitivity): 5 ng/L (ref <18)

## 2020-12-02 LAB — CBG MONITORING, ED: Glucose-Capillary: 128 mg/dL — ABNORMAL HIGH (ref 70–99)

## 2020-12-02 LAB — LACTATE DEHYDROGENASE: LDH: 178 U/L (ref 98–192)

## 2020-12-02 LAB — D-DIMER, QUANTITATIVE: D-Dimer, Quant: 1.35 ug/mL-FEU — ABNORMAL HIGH (ref 0.00–0.50)

## 2020-12-02 LAB — FERRITIN: Ferritin: 174 ng/mL (ref 11–307)

## 2020-12-02 LAB — PROCALCITONIN: Procalcitonin: <0.1 ng/mL

## 2020-12-02 LAB — LACTIC ACID, PLASMA: Lactic Acid, Venous: 1.2 mmol/L (ref 0.5–1.9)

## 2020-12-02 LAB — C-REACTIVE PROTEIN: CRP: 5.9 mg/dL — ABNORMAL HIGH (ref <1.0)

## 2020-12-02 LAB — TRIGLYCERIDES: Triglycerides: 61 mg/dL (ref <150)

## 2020-12-02 LAB — HIV ANTIBODY (ROUTINE TESTING W REFLEX): HIV Screen 4th Generation wRfx: NONREACTIVE

## 2020-12-02 MED ORDER — PANTOPRAZOLE SODIUM 20 MG PO TBEC
20.0000 mg | DELAYED_RELEASE_TABLET | Freq: Every day | ORAL | Status: DC
  Administered 2020-12-02 – 2020-12-05 (×4): 20 mg via ORAL
  Filled 2020-12-02 (×5): qty 1

## 2020-12-02 MED ORDER — HYDROCOD POLST-CPM POLST ER 10-8 MG/5ML PO SUER
5.0000 mL | Freq: Two times a day (BID) | ORAL | Status: DC | PRN
Start: 2020-12-02 — End: 2020-12-06

## 2020-12-02 MED ORDER — PERFLUTREN LIPID MICROSPHERE
1.0000 mL | INTRAVENOUS | Status: AC | PRN
  Administered 2020-12-02: 1 mL via INTRAVENOUS
  Filled 2020-12-02: qty 10

## 2020-12-02 MED ORDER — ATORVASTATIN CALCIUM 10 MG PO TABS
10.0000 mg | ORAL_TABLET | Freq: Every day | ORAL | Status: DC
  Administered 2020-12-02 – 2020-12-05 (×4): 10 mg via ORAL
  Filled 2020-12-02 (×4): qty 1

## 2020-12-02 MED ORDER — IOHEXOL 350 MG/ML SOLN
75.0000 mL | Freq: Once | INTRAVENOUS | Status: AC | PRN
  Administered 2020-12-02: 75 mL via INTRAVENOUS

## 2020-12-02 MED ORDER — FLUTICASONE FUROATE-VILANTEROL 100-25 MCG/INH IN AEPB
1.0000 | INHALATION_SPRAY | Freq: Every day | RESPIRATORY_TRACT | Status: DC
  Administered 2020-12-04 – 2020-12-05 (×2): 1 via RESPIRATORY_TRACT
  Filled 2020-12-02 (×2): qty 28

## 2020-12-02 MED ORDER — TRAZODONE HCL 50 MG PO TABS
150.0000 mg | ORAL_TABLET | Freq: Every evening | ORAL | Status: DC | PRN
  Administered 2020-12-02 – 2020-12-03 (×2): 150 mg via ORAL
  Filled 2020-12-02 (×2): qty 3

## 2020-12-02 MED ORDER — GUAIFENESIN-DM 100-10 MG/5ML PO SYRP
10.0000 mL | ORAL_SOLUTION | ORAL | Status: DC | PRN
  Administered 2020-12-03 – 2020-12-05 (×4): 10 mL via ORAL
  Filled 2020-12-02 (×4): qty 10

## 2020-12-02 MED ORDER — ALPRAZOLAM 0.5 MG PO TABS
1.0000 mg | ORAL_TABLET | Freq: Two times a day (BID) | ORAL | Status: DC | PRN
  Administered 2020-12-02 – 2020-12-04 (×3): 1 mg via ORAL
  Filled 2020-12-02: qty 4
  Filled 2020-12-02 (×2): qty 2

## 2020-12-02 MED ORDER — INSULIN ASPART 100 UNIT/ML ~~LOC~~ SOLN: 0.0000 [IU] | Freq: Three times a day (TID) | SUBCUTANEOUS | Status: DC

## 2020-12-02 MED ORDER — ENOXAPARIN SODIUM 40 MG/0.4ML ~~LOC~~ SOLN
40.0000 mg | SUBCUTANEOUS | Status: DC
  Administered 2020-12-02 – 2020-12-05 (×3): 40 mg via SUBCUTANEOUS
  Filled 2020-12-02 (×3): qty 0.4

## 2020-12-02 MED ORDER — DEXAMETHASONE 6 MG PO TABS
6.0000 mg | ORAL_TABLET | ORAL | Status: DC
  Administered 2020-12-02 – 2020-12-05 (×4): 6 mg via ORAL
  Filled 2020-12-02: qty 2
  Filled 2020-12-02 (×2): qty 1
  Filled 2020-12-02: qty 2

## 2020-12-02 MED ORDER — SODIUM CHLORIDE 0.9 % IV SOLN: 100.0000 mg | Freq: Every day | INTRAVENOUS | Status: DC

## 2020-12-02 MED ORDER — ALBUTEROL SULFATE HFA 108 (90 BASE) MCG/ACT IN AERS
2.0000 | INHALATION_SPRAY | Freq: Four times a day (QID) | RESPIRATORY_TRACT | Status: DC
  Administered 2020-12-02 – 2020-12-04 (×4): 2 via RESPIRATORY_TRACT
  Filled 2020-12-02 (×2): qty 6.7

## 2020-12-02 MED ORDER — SODIUM CHLORIDE 0.9 % IV SOLN
200.0000 mg | Freq: Once | INTRAVENOUS | Status: AC
  Administered 2020-12-02: 200 mg via INTRAVENOUS
  Filled 2020-12-02: qty 200

## 2020-12-02 MED ORDER — POLYETHYLENE GLYCOL 3350 17 G PO PACK: 17.0000 g | PACK | Freq: Every day | ORAL | Status: DC | PRN

## 2020-12-02 MED ORDER — ACETAMINOPHEN 325 MG PO TABS: 650.0000 mg | ORAL_TABLET | Freq: Four times a day (QID) | ORAL | Status: DC | PRN

## 2020-12-02 MED ORDER — SERTRALINE HCL 100 MG PO TABS
300.0000 mg | ORAL_TABLET | Freq: Every day | ORAL | Status: DC
  Administered 2020-12-02 – 2020-12-05 (×4): 300 mg via ORAL
  Filled 2020-12-02 (×5): qty 3

## 2020-12-02 NOTE — ED Provider Notes (Addendum)
[facility] EMERGENCY DEPARTMENT Provider Note   CSN: [id number] Arrival date & time: 12/01/20  1418     History Chief Complaint  Patient presents with  . Shortness of Breath    Covid +    Keerthi Westfall is a 65 y.o. female.  The history is provided by the patient.  Shortness of Breath Severity:  Severe Onset quality:  Gradual Duration:  8 days Timing:  Constant Progression:  Worsening Chronicity:  New Context: URI   Relieved by:  Nothing Worsened by:  Nothing Ineffective treatments:  None tried Associated symptoms: chest pain and cough   Associated symptoms: no fever, no rash and no vomiting   Associated symptoms comment:  Body aches and night sweats  Risk factors: obesity   Patient with asthma diagnosed with covid 19 on Sunday with 5 days of symptoms prior.  Body aches, night sweats, cough, SOB and CP. No fevers, no loss of taste or smell.  No skin changes.   Patient is unvaccinated.       Past Medical History:  Diagnosis Date  . Anxiety   . Asthma    Chronic, with restrictive component 2/2 obesity  . Depression   . GERD (gastroesophageal reflux disease)   . Headache   . Insomnia   . Obesity   . On home oxygen therapy    "3L at night only when I get sick" (04/30/2014)  . Shortness of breath dyspnea   . Sleep apnea    "couldn't afford mask so I didn't get it" (04/30/2014)    Patient Active Problem List   Diagnosis Date Noted  . Cellulitis of finger of left hand 08/08/2020  . Chronic respiratory failure with hypoxia ([place]) 06/30/2020  . Fall from ground level 06/16/2020  . Shortness of breath 06/16/2020  . Hypoxia, sleep related 06/13/2019  . Posterior right knee pain 02/06/2019  . Disequilibrium 02/06/2019  . OCD (obsessive compulsive disorder) 08/13/2018  . Aortic atherosclerosis ([place]) 08/17/2016  . Rash and nonspecific skin eruption 12/17/2015  . Periodic limb movement disorder 08/14/2015  . Obesity hypoventilation syndrome  ([place]) 01/21/2015  . Health care maintenance 07/01/2014  . Asthma 05/28/2013  . OSA (obstructive sleep apnea) 04/04/2013  . Anxiety disorder 07/02/2012  . Depression 06/28/2012  . Morbid obesity ([place]) 03/02/2012  . GERD (gastroesophageal reflux disease) 02/05/2012    Past Surgical History:  Procedure Laterality Date  . APPENDECTOMY  ~ 2002  . VAGINAL HYSTERECTOMY  ~ 1978     OB History   No obstetric history on file.     Family History  Problem Relation Age of Onset  . Breast cancer Mother   . Alcohol abuse Mother   . Emphysema Mother        smoked  . Asthma Mother   . Breast cancer Maternal Grandmother   . Breast cancer Maternal Aunt     Social History   Tobacco Use  . Smoking status: Former Smoker    Packs/day: 0.00    Years: 44.00    Pack years: 0.00    Quit date: 11/07/2012    Years since quitting: 8.0  . Smokeless tobacco: Never Used  Substance Use Topics  . Alcohol use: No    Alcohol/week: 0.0 standard drinks  . Drug use: No    Home Medications Prior to Admission medications   Medication Sig Start Date End Date Taking? Authorizing Provider  acetaminophen (TYLENOL) 500 MG tablet Take 1,000 mg by mouth every 6 (six) hours as  needed (for pain OR back pain).     [provider]  albuterol (VENTOLIN HFA) 108 (90 Base) MCG/ACT inhaler Inhale 2 puffs into the lungs every 6 (six) hours as needed for wheezing or shortness of breath. 06/16/20   Andrew Au, MD  ALPRAZolam Duanne Moron) 1 MG tablet Take 0.5-1 tablets (0.5-1 mg total) by mouth 2 (two) times daily as needed. for anxiety 11/18/20   Donnal Moat T, PA-C  atorvastatin (LIPITOR) 10 MG tablet Take 1 tablet (10 mg total) by mouth daily. 06/13/19   Bartholomew Crews, MD  famotidine (PEPCID) 10 MG tablet Take 1 tablet (10 mg total) by mouth 2 (two) times daily. May increase to 2 pills twice a day if no improvement after 2 weeks 06/13/19   Bartholomew Crews, MD  fluticasone furoate-vilanterol (BREO  ELLIPTA) 100-25 MCG/INH AEPB Inhale 1 puff into the lungs daily. 06/16/20   Andrew Au, MD  meloxicam (MOBIC) 7.5 MG tablet TAKE 1 TABLET BY MOUTH DAILY AS NEEDED 08/14/20   [place], Carley D, DO  methocarbamol (ROBAXIN) 500 MG tablet Take 1 tablet (500 mg total) by mouth every 8 (eight) hours as needed for muscle spasms. 06/16/20   Andrew Au, MD  nystatin (MYCOSTATIN/NYSTOP) 100000 UNIT/GM POWD Please apply to skin under breasts twice daily until rash resolves. Patient not taking: No sig reported 12/17/15   Corky Sox, MD  pantoprazole (PROTONIX) 20 MG tablet Take 1 tablet (20 mg total) by mouth daily. 06/16/20 12/13/20  Andrew Au, MD  sertraline (ZOLOFT) 100 MG tablet Take 3 tablets (300 mg total) by mouth daily. 11/18/20   Addison Lank, PA-C  traZODone (DESYREL) 150 MG tablet Take 1 tablet (150 mg total) by mouth at bedtime as needed for sleep. 11/18/20   Addison Lank, PA-C    Allergies    Ibuprofen and Clonazepam  Review of Systems   Review of Systems  Constitutional: Negative for fever.  HENT: Negative for congestion.   Eyes: Negative for visual disturbance.  Respiratory: Positive for cough and shortness of breath.   Cardiovascular: Positive for chest pain. Negative for leg swelling.  Gastrointestinal: Negative for vomiting.  Genitourinary: Negative for difficulty urinating.  Musculoskeletal: Positive for myalgias.  Skin: Negative for rash.  Neurological: Negative for dizziness.  Psychiatric/Behavioral: Negative for agitation.  All other systems reviewed and are negative.   Physical Exam Updated Vital Signs BP 125/71   Pulse 67   Temp 97.8 F (36.6 C) (Oral)   Resp 18   SpO2 94%   Physical Exam Vitals and nursing note reviewed.  Constitutional:      General: She is not in acute distress.    Appearance: Normal appearance.  HENT:     Head: Normocephalic and atraumatic.     Nose: Nose normal.  Eyes:     Conjunctiva/sclera: Conjunctivae normal.      Pupils: Pupils are equal, round, and reactive to light.  Cardiovascular:     Rate and Rhythm: Normal rate and regular rhythm.     Pulses: Normal pulses.     Heart sounds: Normal heart sounds.  Pulmonary:     Effort: Pulmonary effort is normal.     Breath sounds: Decreased breath sounds present.  Abdominal:     General: Abdomen is flat. Bowel sounds are normal.     Palpations: Abdomen is soft.     Tenderness: There is no abdominal tenderness. There is no guarding.  Musculoskeletal:  General: Normal range of motion.     Cervical back: Normal range of motion and neck supple.  Skin:    General: Skin is warm and dry.     Capillary Refill: Capillary refill takes less than 2 seconds.  Neurological:     General: No focal deficit present.     Mental Status: She is alert and oriented to person, place, and time.  Psychiatric:        Mood and Affect: Mood normal.        Behavior: Behavior normal.     ED Results / Procedures / Treatments   Labs (all labs ordered are listed, but only abnormal results are displayed) Results for orders placed or performed during the hospital encounter of 12/01/20  Lactic acid, plasma  Result Value Ref Range   Lactic Acid, Venous 0.9 0.5 - 1.9 mmol/L  Comprehensive metabolic panel  Result Value Ref Range   Sodium 138 135 - 145 mmol/L   Potassium 3.4 (L) 3.5 - 5.1 mmol/L   Chloride 99 98 - 111 mmol/L   CO2 28 22 - 32 mmol/L   Glucose, Bld 132 (H) 70 - 99 mg/dL   BUN 8 8 - 23 mg/dL   Creatinine, Ser 0.62 0.44 - 1.00 mg/dL   Calcium 8.6 (L) 8.9 - 10.3 mg/dL   Total Protein 6.1 (L) 6.5 - 8.1 g/dL   Albumin 3.2 (L) 3.5 - 5.0 g/dL   AST 19 15 - 41 U/L   ALT 12 0 - 44 U/L   Alkaline Phosphatase 91 38 - 126 U/L   Total Bilirubin 0.5 0.3 - 1.2 mg/dL   GFR, Estimated >60 >60 mL/min   Anion gap 11 5 - 15  CBC with Differential  Result Value Ref Range   WBC 4.8 4.0 - 10.5 K/uL   RBC 5.44 (H) 3.87 - 5.11 MIL/uL   Hemoglobin 15.9 (H) 12.0 - 15.0 g/dL    HCT 47.9 (H) 36.0 - 46.0 %   MCV 88.1 80.0 - 100.0 fL   MCH 29.2 26.0 - 34.0 pg   MCHC 33.2 30.0 - 36.0 g/dL   RDW 14.0 11.5 - 15.5 %   Platelets PLATELET CLUMPS NOTED ON SMEAR, UNABLE TO ESTIMATE 150 - 400 K/uL   nRBC 0.0 0.0 - 0.2 %   Neutrophils Relative % 62 %   Neutro Abs 2.9 1.7 - 7.7 K/uL   Lymphocytes Relative 27 %   Lymphs Abs 1.3 0.7 - 4.0 K/uL   Monocytes Relative 11 %   Monocytes Absolute 0.5 0.1 - 1.0 K/uL   Eosinophils Relative 0 %   Eosinophils Absolute 0.0 0.0 - 0.5 K/uL   Basophils Relative 0 %   Basophils Absolute 0.0 0.0 - 0.1 K/uL   Immature Granulocytes 0 %   Abs Immature Granulocytes 0.02 0.00 - 0.07 K/uL   DG Chest Port 1 View  Result Date: 12/02/2020 CLINICAL DATA:  COVID symptoms beginning 1 week ago. History of asthma with increased shortness of breath. Wheezing. EXAM: PORTABLE CHEST 1 VIEW COMPARISON:  12/01/2020 FINDINGS: Mild cardiac enlargement. Peripheral infiltrates with peribronchial thickening may represent bronchiolitis or multifocal pneumonia. Similar appearance to previous study. No pleural effusions. No pneumothorax. Mediastinal contours appear intact. Calcification of the aorta. IMPRESSION: Peripheral infiltrates with peribronchial thickening may represent bronchiolitis or multifocal pneumonia. Electronically Signed   By: Lucienne Capers M.D.   On: 12/02/2020 00:24   DG Chest Portable 1 View  Result Date: 12/01/2020 CLINICAL DATA:  Shortness of breath.  COVID-19 virus infection. EXAM: PORTABLE CHEST 1 VIEW COMPARISON:  06/16/2020 FINDINGS: Heart size remains stable. New mild peripheral pulmonary infiltrates are seen in both lower lungs, highly suspicious for viral pneumonia. No evidence of pleural effusion. IMPRESSION: New mild peripheral pulmonary infiltrates in both lower lungs, highly suspicious for viral pneumonia. Electronically Signed   By: Marlaine Hind M.D.   On: 12/01/2020 15:03    EKG None  Radiology DG Chest [facility] 1 View  Result  Date: 12/02/2020 CLINICAL DATA:  COVID symptoms beginning 1 week ago. History of asthma with increased shortness of breath. Wheezing. EXAM: PORTABLE CHEST 1 VIEW COMPARISON:  12/01/2020 FINDINGS: Mild cardiac enlargement. Peripheral infiltrates with peribronchial thickening may represent bronchiolitis or multifocal pneumonia. Similar appearance to previous study. No pleural effusions. No pneumothorax. Mediastinal contours appear intact. Calcification of the aorta. IMPRESSION: Peripheral infiltrates with peribronchial thickening may represent bronchiolitis or multifocal pneumonia. Electronically Signed   By: Lucienne Capers M.D.   On: 12/02/2020 00:24   DG Chest Portable 1 View  Result Date: 12/01/2020 CLINICAL DATA:  Shortness of breath.  COVID-19 virus infection. EXAM: PORTABLE CHEST 1 VIEW COMPARISON:  06/16/2020 FINDINGS: Heart size remains stable. New mild peripheral pulmonary infiltrates are seen in both lower lungs, highly suspicious for viral pneumonia. No evidence of pleural effusion. IMPRESSION: New mild peripheral pulmonary infiltrates in both lower lungs, highly suspicious for viral pneumonia. Electronically Signed   By: Marlaine Hind M.D.   On: 12/01/2020 15:03    Procedures Procedures   Medications Ordered in ED Medications  remdesivir 200 mg in sodium chloride 0.9% 250 mL IVPB (has no administration in time range)  remdesivir 100 mg in sodium chloride 0.9 % 100 mL IVPB (has no administration in time range)    ED Course  I have reviewed the triage vital signs and the nursing notes.  Pertinent labs & imaging results that were available during my care of the patient were reviewed by me and considered in my medical decision making (see chart for details).  Astoria Condon Bargar was evaluated in Emergency Department on 12/02/2020 for the symptoms described in the history of present illness. She was evaluated in the context of the global COVID-19 pandemic, which necessitated consideration  that the patient might be at risk for infection with the SARS-CoV-2 virus that causes COVID-19. Institutional protocols and algorithms that pertain to the evaluation of patients at risk for COVID-19 are in a state of rapid change based on information released by regulatory bodies including the CDC and federal and state organizations. These policies and algorithms were followed during the patient's care in the ED.  Final Clinical Impression(s) / ED Diagnoses Final diagnoses:  COVID-19  Abnormal EKG   Admit to medicine for covid PNA and abnormal EKG, likely secondary to demand ischemia.         Jahkai Yandell, MD 12/02/20 [phone]

## 2020-12-02 NOTE — H&P (Signed)
Date: 12/02/2020               Patient Name:  [name] [name] MRN: [id number]  DOB: [birth year] Age / Sex: 65 y.o., female   PCP: Iona Beard, MD         Medical Service: Internal Medicine Teaching Service         Attending Physician: Dr. Lucious Groves, DO    First Contact: Dr. Agustin Cree Pager: [phone]  Second Contact: Dr. Gilford Rile Pager: [phone]       After Hours (After 5p/  First Contact Pager: [phone]  weekends / holidays): Second Contact Pager: [phone]   Chief Complaint: shortness of breath  History of Present Illness:   ULYANA [name] is a 65 year old female with history of OSA, asthma, GERD, depression, and anxiety who is on chronic 3L nightly prn presenting to the ED with shortness of breath.  [name] states that she had a family lunch on January 18th with out of time relatives. Approximately 2 days later, on the 20th, she began to feel ill with severe body aches, non-productive cough, sore throat, mild nasal congestion. She subsequently developed diarrhea, chills and night sweats. Diarrhea is exacerbated by any PO intake and completely liquid-like. She denies any bloody diarrhea. She denies any fevers. She was tested for COVID-19 infection on 11/28/2020 and was positive. Given symptoms did not improve or resolve, she came to the ED for evaluation. She has been using her albuterol inhaler and it has helped some.   She has been having intermittent chest pain that is on the right side and occasionally under her right breast that feels like throbbing. It does not radiate elsewhere. It is slightly worsened if she pushes on it. Intermittently, CP is triggered by exertion and relieved by rest, but not every time. This pain is new since becoming ill on the 20th.   She was recently diagnosed with COVID-19 infection on 11/28/2020, but had symptoms that began about 5 days prior to diagnosis. Reports also having non-productive cough, body aches, and night sweats.  Denies fevers, congestion, chest pain, palpitations, abdominal pain, dysuria, urinary frequency. Reports using albuterol inhaler to help with her breathing given she also has asthma, but this did not seem to relieve her [facility] and her O2 saturations on 3L [place] had come down to low 80s.   She is chronically on 3L of O2 at night, but states that she only uses it as needed. Ms. Fatemi states she has been on oxygen intermittently for years but in the past 5 months, she has been wearing every night when sleeping.   Patient states she has COPD but diagnosis was a long time ago.    ED Course:  CBC showing leukopenia at 1.6. Lactate wnl x2. D-dimer 1.35, fibrinogen 576, and CRP 5.9. LDH 178, ferritin 174. CXR showing peripheral infiltrates with peribronchial thickening, suggestive of bronchiolitis vs multifocal PNA. Procalcitonin negative. CT angio chest pending. IMTS asked to admit for COVID-19 infection and EKG changes.  Meds: Current Meds  Medication Sig  . acetaminophen (TYLENOL) 500 MG tablet Take 1,000 mg by mouth every 6 (six) hours as needed (for pain OR back pain).   Marland Kitchen albuterol (VENTOLIN HFA) 108 (90 Base) MCG/ACT inhaler Inhale 2 puffs into the lungs every 6 (six) hours as needed for wheezing or shortness of breath.  . ALPRAZolam (XANAX) 1 MG tablet Take 0.5-1 tablets (0.5-1 mg total) by mouth 2 (two) times daily as needed. for anxiety  .  ALPRAZolam (XANAX) 1 MG tablet Take 1 mg by mouth at bedtime.  Marland Kitchen atorvastatin (LIPITOR) 10 MG tablet Take 1 tablet (10 mg total) by mouth daily.  . famotidine (PEPCID) 10 MG tablet Take 1 tablet (10 mg total) by mouth 2 (two) times daily. May increase to 2 pills twice a day if no improvement after 2 weeks  . fluticasone furoate-vilanterol (BREO ELLIPTA) 100-25 MCG/INH AEPB Inhale 1 puff into the lungs daily.  . meloxicam (MOBIC) 7.5 MG tablet TAKE 1 TABLET BY MOUTH DAILY AS NEEDED (Patient taking differently: Take 7.5 mg by mouth daily as needed for pain. TAKE 1  TABLET BY MOUTH DAILY AS NEEDED)  . methocarbamol (ROBAXIN) 500 MG tablet Take 1 tablet (500 mg total) by mouth every 8 (eight) hours as needed for muscle spasms.  . pantoprazole (PROTONIX) 20 MG tablet Take 1 tablet (20 mg total) by mouth daily.  . sertraline (ZOLOFT) 100 MG tablet Take 3 tablets (300 mg total) by mouth daily.  . traZODone (DESYREL) 150 MG tablet Take 1 tablet (150 mg total) by mouth at bedtime as needed for sleep.   Allergies: Allergies as of 12/01/2020 - Review Complete 12/01/2020  Allergen Reaction Noted  . Ibuprofen Anaphylaxis, Hives, and Swelling 02/01/2012  . Clonazepam Other (See Comments) 08/30/2019   Past Medical History:  Diagnosis Date  . Anxiety   . Asthma    Chronic, with restrictive component 2/2 obesity  . Depression   . GERD (gastroesophageal reflux disease)   . Headache   . Insomnia   . Obesity   . On home oxygen therapy    "3L at night only when I get sick" (04/30/2014)  . Shortness of breath dyspnea   . Sleep apnea    "couldn't afford mask so I didn't get it" (04/30/2014)   Family History:  Family History  Problem Relation Age of Onset  . Breast cancer Mother   . Alcohol abuse Mother   . Emphysema Mother        smoked  . Asthma Mother   . Breast cancer Maternal Grandmother   . Breast cancer Maternal Aunt    Social History:  - Previous tobacco use, 40 year smoking hx with 2 packs a day. Quit 8 years ago.    Review of Systems: A complete ROS was negative except as per HPI.   Physical Exam: Blood pressure (!) 145/118, pulse 65, temperature 97.8 F (36.6 C), temperature source Oral, resp. rate 17, SpO2 93 %.  Physical Exam Constitutional:      Appearance: She is obese.     Comments: Pleasant elderly female sitting up in bed, NAD.  HENT:     Head: Normocephalic and atraumatic.  Eyes:     Extraocular Movements: Extraocular movements intact.     Pupils: Pupils are equal, round, and reactive to light.  Cardiovascular:     Rate and  Rhythm: Normal rate and regular rhythm.     Pulses: Normal pulses.     Heart sounds: No murmur heard. No friction rub. No gallop.   Pulmonary:     Effort: No accessory muscle usage or respiratory distress.     Comments: Bibasilar wheezing. Saturating >90% on 2L [place]. Chest:     Chest wall: No tenderness.  Abdominal:     General: Bowel sounds are normal.     Palpations: Abdomen is soft.     Tenderness: There is no abdominal tenderness.  Musculoskeletal:        General: Normal range of  motion.     Cervical back: Normal range of motion.     Right lower leg: No edema.     Left lower leg: No edema.  Skin:    General: Skin is warm and dry.  Neurological:     General: No focal deficit present.     Mental Status: She is alert and oriented to person, place, and time.  Psychiatric:        Mood and Affect: Mood normal.        Behavior: Behavior normal.    EKG: sinus rhythm, but with RBBB and LAFB.   CT Angio Chest PE W and/or Wo Contrast  Result Date: 12/02/2020 CLINICAL DATA:  COVID positive, asthma, progressive dyspnea EXAM: CT ANGIOGRAPHY CHEST WITH CONTRAST TECHNIQUE: Multidetector CT imaging of the chest was performed using the standard protocol during bolus administration of intravenous contrast. Multiplanar CT image reconstructions and MIPs were obtained to evaluate the vascular anatomy. CONTRAST:  21mL OMNIPAQUE IOHEXOL 350 MG/ML SOLN COMPARISON:  08/08/2016 FINDINGS: Cardiovascular: There is adequate opacification of the pulmonary arterial tree. No intraluminal filling defect identified to suggest acute pulmonary embolism. Central pulmonary arteries are of normal caliber. Mild coronary artery calcification. Global cardiac size within normal limits. No pericardial effusion. Mild atherosclerotic calcification within the thoracic aorta. No aortic aneurysm. Mediastinum/Nodes: Visualized thyroid is unremarkable. No pathologic thoracic adenopathy. The esophagus is unremarkable. Lungs/Pleura:  The lungs are symmetrically well expanded. There are mild bilateral perihilar and peripheral diffusely distributed ground-glass pulmonary infiltrates most in keeping with atypical infection or inflammation and compatible with given history of COVID-19 pneumonia. Mild associated bronchial wall thickening in keeping with airway inflammation. No pneumothorax or pleural effusion. No central obstructing lesion. Upper Abdomen: No acute abnormality. Musculoskeletal: No acute bone abnormality. No lytic or blastic bone lesion identified. Review of the MIP images confirms the above findings. IMPRESSION: No pulmonary embolism. Mild bilateral pulmonary infiltrates most in keeping with atypical infection and compatible with the given history of COVID-19 pneumonia. Moderate parenchymal involvement. Mild coronary artery calcification. Aortic Atherosclerosis (ICD10-I70.0). Electronically Signed   By: Fidela Salisbury MD   On: 12/02/2020 02:47   DG Chest Port 1 View  Result Date: 12/02/2020 CLINICAL DATA:  COVID symptoms beginning 1 week ago. History of asthma with increased shortness of breath. Wheezing. EXAM: PORTABLE CHEST 1 VIEW COMPARISON:  12/01/2020 FINDINGS: Mild cardiac enlargement. Peripheral infiltrates with peribronchial thickening may represent bronchiolitis or multifocal pneumonia. Similar appearance to previous study. No pleural effusions. No pneumothorax. Mediastinal contours appear intact. Calcification of the aorta. IMPRESSION: Peripheral infiltrates with peribronchial thickening may represent bronchiolitis or multifocal pneumonia. Electronically Signed   By: Lucienne Capers M.D.   On: 12/02/2020 00:24   DG Chest Portable 1 View  Result Date: 12/01/2020 CLINICAL DATA:  Shortness of breath.  COVID-19 virus infection. EXAM: PORTABLE CHEST 1 VIEW COMPARISON:  06/16/2020 FINDINGS: Heart size remains stable. New mild peripheral pulmonary infiltrates are seen in both lower lungs, highly suspicious for viral  pneumonia. No evidence of pleural effusion. IMPRESSION: New mild peripheral pulmonary infiltrates in both lower lungs, highly suspicious for viral pneumonia. Electronically Signed   By: Marlaine Hind M.D.   On: 12/01/2020 15:03    Assessment & Plan by Problem: Active Problems:   Pneumonia due to COVID-19 virus  TYKISHA AREOLA is a 65 year old female with history of OSA, asthma, GERD, depression, and anxiety who is on chronic 3L nightly prn presenting to the ED with shortness of breath, with recent diagnosis  of COVID-19.  COVID-19 PNA As per patient, diagnosed on 11/28/2020 but had symptoms consistent with COVID about 2 days prior (on 11/26/2020). She is on chronic 3L O2 nightly as needed. CXR showing peripheral infiltrates with peribronchial thickening, suggestive of bronchiolitis vs multifocal PNA. However, procalcitonin negative. ED provider ordered CT Angio Chest due to elevated d-dimer and SHOB, but came back negative for PE. Inflammatory markers mildly elevated and imaging consistent with mild-moderate COVID PNA. -blood cultures pending -urinalysis pending -trend inflammatory markers -ICS and flutter valve -monitor O2 sats with ambulation -decadron and remdesivir -lovenox ppx -robitussin q4h prn -tylenol prn  Chest pain Intermittent chest pain on right side that began after diagnosis of COVID-19. 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 Troponin 6. Low suspicion for ACS but will obtain ECHO and repeat trops. Prior ECHOs unreadable due to body habitus. -f/u ECHO -f/u repeat trops  Asthma OSA PFTs done in 2014 and evaluated by [employer], thought they were consistent with asthma with a restrictive component 2/2 obesity and not COPD. She is on chronic 3L O2 at home nightly and with ambulation, however patient only uses it as needed. On albuterol inhaler and breo at home. -ventolin 2 puffs q6h -breo ellipta 1 puff daily  GERD On protonix 20mg  at  home. -continue protonix  Depression Anxiety On zoloft and trazodone at home for depression. On xanax BID for anxiety. -continue medications -starting xanax BID prn  HLD On lipitor 10mg  at home -continue lipitor  Dispo: Admit patient to Observation with expected length of stay less than 2 midnights.  Signed: Virl Axe, MD 12/02/2020, 3:56 AM  Pager: [phone] After 5pm on weekdays and 1pm on weekends: On Call pager: [phone]

## 2020-12-02 NOTE — Progress Notes (Signed)
Echocardiogram 2D Echocardiogram has been performed.  Oneal Deputy Staisha Winiarski 12/02/2020, 12:06 PM   Left arm IV removed by echo tech due to infiltration. Definity administered through right hand IV.

## 2020-12-02 NOTE — ED Notes (Signed)
Lunch Tray Ordered @ 1032. 

## 2020-12-02 NOTE — Progress Notes (Addendum)
   Subjective:   Patient states that she is doing better than yesterday. We discussed vaccinations today and decadron this morning. We also discussed ivermectin usage and that several members of her family have taken this particular medication.   She has had her current symptoms for the last 6 days, after a family gathering. Her family is unvaccinated, and multiple members have contracted COVID 42.   Objective:  Vital signs in last 24 hours: Vitals:   12/01/20 1915 12/01/20 2242 12/02/20 0046  BP: 115/64 125/71 (!) 145/118  Pulse: 70 67 65  Resp: 18 18 17   Temp: 97.8 F (36.6 C)  97.8 F (36.6 C)  TempSrc: Oral  Oral  SpO2: 95% 94% 93%   Physical Exam Constitutional:      General: She is not in acute distress.    Appearance: She is obese.     Comments: Diaphoretic   Cardiovascular:     Rate and Rhythm: Normal rate and regular rhythm.  Pulmonary:     Effort: Pulmonary effort is normal. No accessory muscle usage or respiratory distress.     Breath sounds: Examination of the right-middle field reveals wheezing. Examination of the left-middle field reveals wheezing. Examination of the right-lower field reveals wheezing. Examination of the left-lower field reveals wheezing. Wheezing present.     Comments: On 2L O2 will 94% while resting, desaturates to 85% while conversing.  Neurological:     Mental Status: She is alert.     Assessment/Plan:  Active Problems:   Pneumonia due to COVID-19 virus  [name] [name] is a 65 year old female with history of OSA, asthma, GERD, depression, and anxiety who is on chronic 3L nightly prn presenting to the ED with shortness of breath, with recent diagnosis of COVID-19.  COVID-19 PNA Leukopenia Thrombocytopenia:  Patient currently on home 2L (2-3), when sitting comfortably at bedside she saturates well >94%, but during the patient interview she would desaturate to ~85% with conversation. Her UA shows positive nitrites, proteinuria (30) and  rare bacteremia, but is asymptomatic. Procalcitonin negative. CBC today with leukopenic at 1.6 from 4.8 yesterday, but likely due to her COVID 19 infection. Patient with platelet count baseline of ~170 down to 120 today. Will continue to trend CBC to monitor wbc and platelets. Patient does not have DM, but given steroid use will monitor CBG and make adjustments based off her initial sugars.  -blood cultures pending  -trend inflammatory markers -ICS and flutter valve -decadron 6 mg daily 2/10 -lovenox ppx -robitussin q4h prn -tylenol prn - SSI, moderate  Chest pain Troponins flat. Prior ECHOs unreadable due to body habitus, repeat echo currently in process.  -f/u ECHO results  Asthma OSA -Ventolin 2 puffs q6h -Breo ellipta 1 puff daily  GERD -continue protonix  Depression Anxiety On zoloft and trazodone at home for depression. On xanax BID for anxiety. -Continue home Zoloft 300mg  daily and trazadone 150 mg QHS PRN.  -Continue xanax BID prn  HLD -Continue Lipitor 10 mg  Prior to Admission Living Arrangement: Home Anticipated Discharge Location: Home Barriers to Discharge: Continued workup  Dispo: Anticipated discharge in approximately 3-4 day(s).   Maudie Mercury, MD 12/02/2020, 7:59 AM Pager: [phone] After 5pm on weekdays and 1pm on weekends: On Call pager [phone]

## 2020-12-03 DIAGNOSIS — U071 COVID-19: Secondary | ICD-10-CM | POA: Diagnosis not present

## 2020-12-03 DIAGNOSIS — J1282 Pneumonia due to coronavirus disease 2019: Secondary | ICD-10-CM | POA: Diagnosis not present

## 2020-12-03 LAB — BASIC METABOLIC PANEL
Anion gap: 9 (ref 5–15)
BUN: 12 mg/dL (ref 8–23)
CO2: 29 mmol/L (ref 22–32)
Calcium: 8.5 mg/dL — ABNORMAL LOW (ref 8.9–10.3)
Chloride: 104 mmol/L (ref 98–111)
Creatinine, Ser: 0.62 mg/dL (ref 0.44–1.00)
GFR, Estimated: >60 mL/min (ref >60)
Glucose, Bld: 101 mg/dL — ABNORMAL HIGH (ref 70–99)
Potassium: 3.6 mmol/L (ref 3.5–5.1)
Sodium: 142 mmol/L (ref 135–145)

## 2020-12-03 LAB — CBC
HCT: 46.9 % — ABNORMAL HIGH (ref 36.0–46.0)
Hemoglobin: 15.4 g/dL — ABNORMAL HIGH (ref 12.0–15.0)
MCH: 29.4 pg (ref 26.0–34.0)
MCHC: 32.8 g/dL (ref 30.0–36.0)
MCV: 89.5 fL (ref 80.0–100.0)
Platelets: 126 10*3/uL — ABNORMAL LOW (ref 150–400)
RBC: 5.24 MIL/uL — ABNORMAL HIGH (ref 3.87–5.11)
RDW: 13.8 % (ref 11.5–15.5)
WBC: 4.4 10*3/uL (ref 4.0–10.5)
nRBC: 0 % (ref 0.0–0.2)

## 2020-12-03 LAB — C-REACTIVE PROTEIN: CRP: 1.9 mg/dL — ABNORMAL HIGH (ref <1.0)

## 2020-12-03 LAB — GLUCOSE, CAPILLARY
Glucose-Capillary: 105 mg/dL — ABNORMAL HIGH (ref 70–99)
Glucose-Capillary: 97 mg/dL (ref 70–99)
Glucose-Capillary: 104 mg/dL — ABNORMAL HIGH (ref 70–99)

## 2020-12-03 LAB — D-DIMER, QUANTITATIVE: D-Dimer, Quant: 1.13 ug/mL-FEU — ABNORMAL HIGH (ref 0.00–0.50)

## 2020-12-03 LAB — CBG MONITORING, ED: Glucose-Capillary: 104 mg/dL — ABNORMAL HIGH (ref 70–99)

## 2020-12-03 NOTE — Plan of Care (Signed)
  Problem: Clinical Measurements: Goal: Will remain free from infection Outcome: Progressing Goal: Respiratory complications will improve Outcome: Progressing Goal: Cardiovascular complication will be avoided Outcome: Progressing   Problem: Nutrition: Goal: Adequate nutrition will be maintained Outcome: Progressing   Problem: Pain Managment: Goal: General experience of comfort will improve Outcome: Progressing   Problem: Safety: Goal: Ability to remain free from injury will improve Outcome: Progressing   Problem: Skin Integrity: Goal: Risk for impaired skin integrity will decrease Outcome: Progressing   Problem: Education: Goal: Knowledge of risk factors and measures for prevention of condition will improve Outcome: Progressing   Problem: Coping: Goal: Psychosocial and spiritual needs will be supported Outcome: Progressing   Problem: Respiratory: Goal: Will maintain a patent airway Outcome: Progressing Goal: Complications related to the disease process, condition or treatment will be avoided or minimized Outcome: Progressing

## 2020-12-03 NOTE — ED Notes (Signed)
Report given to inpatient RN.

## 2020-12-03 NOTE — Progress Notes (Signed)
Patient admitted to 5W from ED. Patient is alert and oriented x4. Vital signs are stable and he is on 2L of oxygen. Has no complaints of pain. Skin is intact, no signs of skin breakdown noted on exam. Patient belongings at bedside (clothing, purse, ring on finger). The patient was shown how to use the call bell. Call bell, phone and bedside table are within reach; bed is in the lowest position.

## 2020-12-03 NOTE — Progress Notes (Signed)
   Subjective:   Patient evaluated at bedside this morning, states she feels overall better and her breathing has improved. She notes it is not yet at baseline however. She also endorses continued weakness when walking to the bathroom.   She has no other concerns or questions at the end of our examination.    Objective:  Vital signs in last 24 hours: Vitals:   12/03/20 0200 12/03/20 0256 12/03/20 0257 12/03/20 0508  BP:    96/69  Pulse: 60 67 61 68  Resp: 18 17 (!) 26 (!) 23  Temp:      TempSrc:      SpO2: 91% 97% 96% 93%   Physical Exam Constitutional:      General: She is not in acute distress.    Appearance: She is obese.  Cardiovascular:     Rate and Rhythm: Normal rate and regular rhythm.  Pulmonary:     Effort: Pulmonary effort is normal. No accessory muscle usage or respiratory distress.     Breath sounds: Wheezing present.     Comments: On 3L O2 will 94% while resting. Neurological:     General: No focal deficit present.     Mental Status: She is alert and oriented to person, place, and time.  Psychiatric:        Mood and Affect: Mood normal.     Assessment/Plan:  Principal Problem:   Pneumonia due to COVID-19 virus Active Problems:   Morbid obesity ([place])   Aortic atherosclerosis ([facility])   Acute on chronic respiratory failure with hypoxemia ([place])   COVID-19 with pulmonary comorbidity   Thrombocytopenia due to COVID-19 virus   Leukopenia  EMBERLEIGH REILY is a 65 year old female with history of OSA, asthma, GERD, depression, and anxiety who is on chronic 3L nightly prn presenting to the ED with shortness of breath, with recent diagnosis of COVID-19.  COVID-19 PNA Leukopenia Thrombocytopenia:  Currently on home 3L. Wheezing has improved since yesterday. Overall patient endorses feeling better, but having persistent weakness. Leukopenia has improved and appears as though patient is having platelet clumping per smear. Plan for PT/OT consult to assess  patient's weakness and possible need for rehab facility.  -PT/OT consult placed -blood cultures pending  -inflammatory markers trending downward -ICS and flutter valve -decadron 6 mg daily 2/10 -lovenox ppx -robitussin q4h prn -tylenol prn - SSI, moderate. Sugars well controlled  Chest pain Troponins flat. Echo yesterday without acute changes from prior.  -echo with no acute changes when compared to prior.   Asthma OSA -Ventolin 2 puffs q6h -Breo ellipta 1 puff daily  GERD -continue protonix  Depression Anxiety On zoloft and trazodone at home for depression. On xanax BID for anxiety. -Continue home Zoloft 300mg  daily and trazadone 150 mg QHS PRN.  -Continue xanax BID prn  HLD -Continue Lipitor 10 mg  Prior to Admission Living Arrangement: Home Anticipated Discharge Location: Home Barriers to Discharge: Weakness and PT/OT evaluation Dispo: Anticipated discharge in approximately 3-4 day(s).   Riesa Pope, MD 12/03/2020, 8:20 AM Pager: [phone] After 5pm on weekdays and 1pm on weekends: On Call pager [phone]

## 2020-12-03 NOTE — Evaluation (Signed)
Physical Therapy Evaluation Patient Details Name: [name] [name] MRN: [id number] DOB: August 27, 1956 Today's Date: 12/03/2020   History of Present Illness  [name] [name] is a 65 year old female with history of OSA, asthma, GERD, depression, and anxiety who is on chronic 3L nightly prn presenting to the ED with shortness of breath.  She also developed diarrhea, chills and night sweats.  She was tested for COVID-19 infection on 11/28/2020 and was positive.  Clinical Impression  Patient presents with decreased mobility due to LE weakness, decreased endurance, decreased balance, and she will benefit from skilled PT in the acute setting.  Walked much safe with RW than her QC from home.  She thinks they have RW at home.  PT to follow and feel she will need follow up HHPT at d/c.     Follow Up Recommendations Home health PT    Equipment Recommendations  None recommended by PT (assuming spouse states they have a RW)    Recommendations for Other Services       Precautions / Restrictions Precautions Precautions: Fall Precaution Comments: O2      Mobility  Bed Mobility Overal bed mobility: Modified Independent                  Transfers Overall transfer level: Needs assistance Equipment used: Quad cane;Rolling walker (2 wheeled) Transfers: Sit to/from Stand Sit to Stand: Supervision         General transfer comment: initially with QC she brought from home, then used RW in the room due to unsteady  Ambulation/Gait Ambulation/Gait assistance: Min Gaffer (Feet): 80 Feet Assistive device: Quad cane;Rolling walker (2 wheeled) Gait Pattern/deviations: Step-through pattern;Decreased stride length;Wide base of support     General Gait Details: slow pace with cane initially and unsteady needing minguard A, then used RW with S and improved gait speed, stayed in room so turning to walk in room with extra time  Stairs            Wheelchair  Mobility    Modified Rankin (Stroke Patients Only)       Balance Overall balance assessment: Needs assistance   Sitting balance-Leahy Scale: Good     Standing balance support: Single extremity supported Standing balance-Leahy Scale: Poor Standing balance comment: holding onto something while standing due to LE weakness;                             Pertinent Vitals/Pain Pain Assessment: Faces Faces Pain Scale: Hurts a little bit Pain Location: chset pain with SOB during ambulation Pain Descriptors / Indicators: Discomfort Pain Intervention(s): Monitored during session;Repositioned    Home Living Family/patient expects to be discharged to:: Private residence Living Arrangements: Spouse/significant other Available Help at Discharge: Family;Available PRN/intermittently Type of Home: House Home Access: Stairs to enter;Ramped entrance   Entrance Stairs-Number of Steps: 4 Home Layout: Two level;Able to live on main level with bedroom/bathroom Home Equipment: Cane - quad;Other (comment) Additional Comments: wears O2 at night, thinks they have a walker somewhere    Prior Function Level of Independence: Independent with assistive device(s)         Comments: occasional use of cane     Hand Dominance        Extremity/Trunk Assessment   Upper Extremity Assessment Upper Extremity Assessment: Generalized weakness    Lower Extremity Assessment Lower Extremity Assessment: Generalized weakness    Cervical / Trunk Assessment Cervical / Trunk Assessment: Kyphotic  Communication   Communication: No difficulties  Cognition Arousal/Alertness: Awake/alert Behavior During Therapy: WFL for tasks assessed/performed Overall Cognitive Status: Within Functional Limits for tasks assessed                                        General Comments General comments (skin integrity, edema, etc.): SpO2 96% with ambulation on 2L O2    Exercises      Assessment/Plan    PT Assessment Patient needs continued PT services  PT Problem List Decreased strength;Decreased mobility;Decreased activity tolerance;Decreased balance;Decreased knowledge of use of DME;Cardiopulmonary status limiting activity       PT Treatment Interventions DME instruction;Therapeutic activities;Patient/family education;Therapeutic exercise;Gait training;Balance training;Functional mobility training    PT Goals (Current goals can be found in the Care Plan section)  Acute Rehab PT Goals Patient Stated Goal: to return home/independent PT Goal Formulation: With patient Time For Goal Achievement: 12/17/20 Potential to Achieve Goals: Good    Frequency Min 3X/week   Barriers to discharge        Co-evaluation               AM-PAC PT "6 Clicks" Mobility  Outcome Measure Help needed turning from your back to your side while in a flat bed without using bedrails?: None Help needed moving from lying on your back to sitting on the side of a flat bed without using bedrails?: None Help needed moving to and from a bed to a chair (including a wheelchair)?: A Little Help needed standing up from a chair using your arms (e.g., wheelchair or bedside chair)?: A Little Help needed to walk in hospital room?: A Little Help needed climbing 3-5 steps with a railing? : A Little 6 Click Score: 20    End of Session Equipment Utilized During Treatment: Oxygen Activity Tolerance: Patient limited by fatigue Patient left: in bed;with call bell/phone within reach   PT Visit Diagnosis: Muscle weakness (generalized) (M62.81);Other abnormalities of gait and mobility (R26.89)    Time: 1433-1500 PT Time Calculation (min) (ACUTE ONLY): 27 min   Charges:   PT Evaluation $PT Eval Moderate Complexity: 1 Mod PT Treatments $Gait Training: 8-22 mins        Magda Kiel, PT Acute Rehabilitation Services VXYIA:[phone] Office:[phone] 12/03/2020   Reginia Naas 12/03/2020, 3:23 PM

## 2020-12-04 DIAGNOSIS — J1282 Pneumonia due to coronavirus disease 2019: Secondary | ICD-10-CM | POA: Diagnosis not present

## 2020-12-04 DIAGNOSIS — U071 COVID-19: Secondary | ICD-10-CM | POA: Diagnosis not present

## 2020-12-04 LAB — GLUCOSE, CAPILLARY
Glucose-Capillary: 95 mg/dL (ref 70–99)
Glucose-Capillary: 112 mg/dL — ABNORMAL HIGH (ref 70–99)
Glucose-Capillary: 106 mg/dL — ABNORMAL HIGH (ref 70–99)
Glucose-Capillary: 109 mg/dL — ABNORMAL HIGH (ref 70–99)

## 2020-12-04 LAB — CBC
HCT: 42.2 % (ref 36.0–46.0)
Hemoglobin: 13.8 g/dL (ref 12.0–15.0)
MCH: 29.1 pg (ref 26.0–34.0)
MCHC: 32.7 g/dL (ref 30.0–36.0)
MCV: 89 fL (ref 80.0–100.0)
Platelets: 136 10*3/uL — ABNORMAL LOW (ref 150–400)
RBC: 4.74 MIL/uL (ref 3.87–5.11)
RDW: 13.8 % (ref 11.5–15.5)
WBC: 4.5 10*3/uL (ref 4.0–10.5)
nRBC: 0 % (ref 0.0–0.2)

## 2020-12-04 LAB — BASIC METABOLIC PANEL
Anion gap: 10 (ref 5–15)
BUN: 13 mg/dL (ref 8–23)
CO2: 28 mmol/L (ref 22–32)
Calcium: 8.4 mg/dL — ABNORMAL LOW (ref 8.9–10.3)
Chloride: 104 mmol/L (ref 98–111)
Creatinine, Ser: 0.62 mg/dL (ref 0.44–1.00)
GFR, Estimated: >60 mL/min (ref >60)
Glucose, Bld: 92 mg/dL (ref 70–99)
Potassium: 3.3 mmol/L — ABNORMAL LOW (ref 3.5–5.1)
Sodium: 142 mmol/L (ref 135–145)

## 2020-12-04 LAB — D-DIMER, QUANTITATIVE: D-Dimer, Quant: 0.99 ug/mL-FEU — ABNORMAL HIGH (ref 0.00–0.50)

## 2020-12-04 LAB — C-REACTIVE PROTEIN: CRP: 1.1 mg/dL — ABNORMAL HIGH (ref <1.0)

## 2020-12-04 MED ORDER — ALBUTEROL SULFATE HFA 108 (90 BASE) MCG/ACT IN AERS
2.0000 | INHALATION_SPRAY | Freq: Four times a day (QID) | RESPIRATORY_TRACT | Status: DC | PRN
  Filled 2020-12-04: qty 6.7

## 2020-12-04 MED ORDER — IPRATROPIUM-ALBUTEROL 20-100 MCG/ACT IN AERS
1.0000 | INHALATION_SPRAY | Freq: Four times a day (QID) | RESPIRATORY_TRACT | Status: DC
  Administered 2020-12-04 – 2020-12-05 (×5): 1 via RESPIRATORY_TRACT
  Filled 2020-12-04: qty 4

## 2020-12-04 MED ORDER — SENNOSIDES-DOCUSATE SODIUM 8.6-50 MG PO TABS
1.0000 | ORAL_TABLET | Freq: Two times a day (BID) | ORAL | Status: DC
  Administered 2020-12-04 – 2020-12-05 (×3): 1 via ORAL
  Filled 2020-12-04 (×3): qty 1

## 2020-12-04 MED ORDER — POLYETHYLENE GLYCOL 3350 17 G PO PACK
17.0000 g | PACK | Freq: Every day | ORAL | Status: DC
  Administered 2020-12-04 – 2020-12-05 (×2): 17 g via ORAL
  Filled 2020-12-04 (×2): qty 1

## 2020-12-04 NOTE — Progress Notes (Signed)
SATURATION QUALIFICATIONS: (This note is used to comply with regulatory documentation for home oxygen)  Patient Saturations on Room Air at Rest = 92%  Patient Saturations on Room Air while Ambulating = 84%  Patient Saturations on 3 Liters of oxygen while Ambulating = 91%  Please briefly explain why patient needs home oxygen: to maintain Sp02 >90% during ADLs and functional mobility.   [name]., OTR/L Acute Rehabilitation Services Pager [phone] Office [phone]

## 2020-12-04 NOTE — Progress Notes (Signed)
   Subjective:   Patient sitting upright in recliner. She notes overall feeling better but continues needing supplemental oxygen. She also endorses not having a BM in a few days and states he normally has them every other day or so.   Objective:  Vital signs in last 24 hours: Vitals:   12/03/20 1625 12/03/20 2004 12/03/20 2032 12/04/20 0637  BP: (!) 91/56 97/69  110/71  Pulse:  69  65  Resp: 18 20  20   Temp: 97.9 F (36.6 C) 97.6 F (36.4 C)  97.7 F (36.5 C)  TempSrc: Oral Oral  Oral  SpO2:  92%  90%  Weight:   120.9 kg   Height:   5\' 8"  (1.727 m)    Physical Exam Constitutional:      General: She is not in acute distress.    Appearance: She is obese.  Cardiovascular:     Rate and Rhythm: Normal rate and regular rhythm.  Pulmonary:     Effort: Pulmonary effort is normal. No accessory muscle usage or respiratory distress.     Breath sounds: Wheezing present.     Comments: On 3L O2 will 94% while resting. Neurological:     General: No focal deficit present.     Mental Status: She is alert and oriented to person, place, and time.  Psychiatric:        Mood and Affect: Mood normal.     Assessment/Plan:  Principal Problem:   Pneumonia due to COVID-19 virus Active Problems:   Morbid obesity ([place])   Aortic atherosclerosis ([facility])   Acute on chronic respiratory failure with hypoxemia ([place])   COVID-19 with pulmonary comorbidity   Thrombocytopenia due to COVID-19 virus   Leukopenia  GEANNA DIVIRGILIO is a 65 year old female with history of OSA, asthma, GERD, depression, and anxiety who is on chronic 3L nightly prn presenting to the ED with shortness of breath, with recent diagnosis of COVID-19.  COVID-19 PNA Leukopenia Thrombocytopenia:  Currently on 3L at rest, normally on RA at rest and 3L with ambulation.  Wheezing has persisted. Will add on combivent scheduled q6h. Continue to monitor patient for improvement in the setting of her significant pulmonary risk factors.   -PT/OT recommend Home Health -blood cultures no growth day 1  -inflammatory markers trending downward -ICS and flutter valve -combivent q6h scheduled  -decadron 6 mg daily 3/10 -lovenox ppx -robitussin q4h prn -tylenol prn -SSI, moderate. Sugars well controlled  Constipation Scheduled miralax and senna daily  Chest pain Troponins flat. Echo yesterday without acute changes from prior.  -echo with no acute changes when compared to prior.   Asthma OSA -Ventolin 2 puffs q6h -Breo ellipta 1 puff daily  GERD -continue protonix  Depression Anxiety On zoloft and trazodone at home for depression. On xanax BID for anxiety. -Continue home Zoloft 300mg  daily and trazadone 150 mg QHS PRN.  -Continue xanax BID prn  HLD -Continue Lipitor 10 mg  Prior to Admission Living Arrangement: Home Anticipated Discharge Location: Home Barriers to Discharge: Weakness and PT/OT evaluation Dispo: Anticipated discharge in approximately 0-1 days.   Riesa Pope, MD 12/04/2020, 6:40 AM Pager: [phone] After 5pm on weekdays and 1pm on weekends: On Call pager [phone]

## 2020-12-04 NOTE — Evaluation (Signed)
Occupational Therapy Evaluation Patient Details Name: [name] [name] MRN: [id number] DOB: 1956-05-23 Today's Date: 12/04/2020    History of Present Illness [name] [name] is a 65 year old female with history of OSA, asthma, GERD, depression, and anxiety who is on chronic 3L nightly prn presenting to the ED with shortness of breath.  She also developed diarrhea, chills and night sweats.  She was tested for COVID-19 infection on 11/28/2020 and was positive.   Clinical Impression   Pt admitted with above. She demonstrates the below listed deficits and will benefit from continued OT to maximize safety and independence with BADLs.  Pt presents to OT with generalized weakness, decreased activity tolerance, impaired balance.  She requires min guard - min A for ADLs and min guard assist for ADLs, but fatigues quickly.  DOE 3/4 - 4/4 with activity and Sp02 87-94% on 3L supplemental 02.  She reports she lives with her spouse and was independent with ADLs, and mod I with functional mobility using a cane.  Spouse assists with IADLs.  She reports she uses 3L 02 at night.  Recommend HHOT, and an aide at discharge as well as tub transfer bench and 3in1 commode.       Follow Up Recommendations  Home health OT;Other (comment) (aide)    Equipment Recommendations  Tub/shower bench;3 in 1 bedside commode    Recommendations for Other Services       Precautions / Restrictions Precautions Precautions: Fall Precaution Comments: O2      Mobility Bed Mobility               General bed mobility comments: sitting EOB    Transfers Overall transfer level: Needs assistance Equipment used: Rolling walker (2 wheeled) Transfers: Sit to/from [facility] Sit to Stand: Supervision Stand pivot transfers: Min guard       General transfer comment: min guard for safety    Balance Overall balance assessment: Needs assistance   Sitting balance-Leahy Scale: Good Sitting balance -  Comments: able to don/doff socks EOB   Standing balance support: During functional activity;Single extremity supported Standing balance-Leahy Scale: Poor Standing balance comment: requires UE support                           ADL either performed or assessed with clinical judgement   ADL Overall ADL's : Needs assistance/impaired Eating/Feeding: Independent   Grooming: Wash/dry hands;Wash/dry face;Oral care;Brushing hair;Min guard;Standing   Upper Body Bathing: Set up;Sitting   Lower Body Bathing: Min guard;Sit to/from stand   Upper Body Dressing : Set up;Sitting   Lower Body Dressing: Min guard;Sit to/from stand   Toilet Transfer: Min guard;Ambulation;Comfort height toilet;BSC;Grab bars;RW   Toileting- Water quality scientist and Hygiene: Min guard;Sit to/from stand       Functional mobility during ADLs: Min guard;Rolling walker General ADL Comments: Pt requires frequent rest breaks DOE 3/4 - 4/4     Vision Patient Visual Report: No change from baseline       Perception     Praxis      Pertinent Vitals/Pain Pain Assessment: Faces Faces Pain Scale: Hurts a little bit Pain Location: chset pain with SOB during ambulation Pain Descriptors / Indicators: Discomfort Pain Intervention(s): Monitored during session     Hand Dominance Right   Extremity/Trunk Assessment Upper Extremity Assessment Upper Extremity Assessment: Generalized weakness   Lower Extremity Assessment Lower Extremity Assessment: Generalized weakness   Cervical / Trunk Assessment Cervical / Trunk Assessment: Kyphotic  Communication Communication Communication: No difficulties   Cognition Arousal/Alertness: Awake/alert Behavior During Therapy: WFL for tasks assessed/performed Overall Cognitive Status: Within Functional Limits for tasks assessed                                     General Comments  Sp02 87-94% on 3L supplemental 02 during activity.  HR 77. DOE 3/4 -  4/4    Exercises Exercises: Other exercises Other Exercises Other Exercises: reviewed energy conservation strategies emphasizing need to take rest breaks when DOE 3/4. Other Exercises: enouraged use of IS   Shoulder Instructions      Home Living Family/patient expects to be discharged to:: Private residence Living Arrangements: Spouse/significant other Available Help at Discharge: Family;Available PRN/intermittently Type of Home: House Home Access: Stairs to enter;Ramped entrance Entrance Stairs-Number of Steps: 4 Entrance Stairs-Rails: Right Home Layout: Two level;Able to live on main level with bedroom/bathroom Alternate Level Stairs-Number of Steps: she does not go upstairs   Bathroom Shower/Tub: Teacher, early years/pre: Standard     Home Equipment: Cane - quad;Other (comment)   Additional Comments: wears O2 at night,      Prior Functioning/Environment Level of Independence: Independent with assistive device(s)        Comments: independent with ADLs, but requires assist with IADLs        OT Problem List: Decreased strength;Decreased activity tolerance;Impaired balance (sitting and/or standing);Decreased knowledge of use of DME or AE;Cardiopulmonary status limiting activity;Obesity      OT Treatment/Interventions: Self-care/ADL training;Therapeutic exercise;Energy conservation;DME and/or AE instruction;Patient/family education;Balance training;Therapeutic activities    OT Goals(Current goals can be found in the care plan section) Acute Rehab OT Goals Patient Stated Goal: to regain strength OT Goal Formulation: With patient Time For Goal Achievement: 12/17/20 Potential to Achieve Goals: Good ADL Goals Additional ADL Goal #1: Pt will independently incorporate energy conservation strategies during ADLs Additional ADL Goal #2: Pt will actively participate in 25 mins therapeutic activity with no more than 3 rest breaks, and DOE no >3/4 and VSS Additional  ADL Goal #3: Pt will be mod I with ADLs using RW  OT Frequency: Min 2X/week   Barriers to D/C:            Co-evaluation              AM-PAC OT "6 Clicks" Daily Activity     Outcome Measure Help from another person eating meals?: None Help from another person taking care of personal grooming?: A Little Help from another person toileting, which includes using toliet, bedpan, or urinal?: A Little Help from another person bathing (including washing, rinsing, drying)?: A Little Help from another person to put on and taking off regular upper body clothing?: A Little Help from another person to put on and taking off regular lower body clothing?: A Little 6 Click Score: 19   End of Session Equipment Utilized During Treatment: Rolling walker;Oxygen Nurse Communication: Mobility status  Activity Tolerance: Patient limited by fatigue Patient left: in chair;with call bell/phone within reach  OT Visit Diagnosis: Unsteadiness on feet (R26.81)                Time: 0240-9735 OT Time Calculation (min): 28 min Charges:  OT General Charges $OT Visit: 1 Visit OT Evaluation $OT Eval Moderate Complexity: 1 Mod OT Treatments $Therapeutic Activity: 8-22 mins  [name]., OTR/L Acute Rehabilitation Services Pager [phone] Office [phone]  Lucille Passy M 12/04/2020, 11:40 AM

## 2020-12-05 DIAGNOSIS — U071 COVID-19: Secondary | ICD-10-CM | POA: Diagnosis not present

## 2020-12-05 DIAGNOSIS — J1282 Pneumonia due to coronavirus disease 2019: Secondary | ICD-10-CM | POA: Diagnosis not present

## 2020-12-05 LAB — CBC
HCT: 43.1 % (ref 36.0–46.0)
Hemoglobin: 14.2 g/dL (ref 12.0–15.0)
MCH: 29.3 pg (ref 26.0–34.0)
MCHC: 32.9 g/dL (ref 30.0–36.0)
MCV: 89 fL (ref 80.0–100.0)
Platelets: 128 10*3/uL — ABNORMAL LOW (ref 150–400)
RBC: 4.84 MIL/uL (ref 3.87–5.11)
RDW: 13.3 % (ref 11.5–15.5)
WBC: 5.5 10*3/uL (ref 4.0–10.5)
nRBC: 0 % (ref 0.0–0.2)

## 2020-12-05 LAB — BASIC METABOLIC PANEL
Anion gap: 8 (ref 5–15)
BUN: 11 mg/dL (ref 8–23)
CO2: 28 mmol/L (ref 22–32)
Calcium: 8.6 mg/dL — ABNORMAL LOW (ref 8.9–10.3)
Chloride: 105 mmol/L (ref 98–111)
Creatinine, Ser: 0.62 mg/dL (ref 0.44–1.00)
GFR, Estimated: >60 mL/min (ref >60)
Glucose, Bld: 98 mg/dL (ref 70–99)
Potassium: 4 mmol/L (ref 3.5–5.1)
Sodium: 141 mmol/L (ref 135–145)

## 2020-12-05 LAB — D-DIMER, QUANTITATIVE: D-Dimer, Quant: 0.9 ug/mL-FEU — ABNORMAL HIGH (ref 0.00–0.50)

## 2020-12-05 LAB — GLUCOSE, CAPILLARY
Glucose-Capillary: 88 mg/dL (ref 70–99)
Glucose-Capillary: 111 mg/dL — ABNORMAL HIGH (ref 70–99)

## 2020-12-05 LAB — C-REACTIVE PROTEIN: CRP: 0.7 mg/dL (ref <1.0)

## 2020-12-05 MED ORDER — GUAIFENESIN-DM 100-10 MG/5ML PO SYRP: 10.0000 mL | ORAL_SOLUTION | ORAL | 0 refills | Status: DC | PRN

## 2020-12-05 NOTE — Progress Notes (Signed)
SATURATION QUALIFICATIONS: (This note is used to comply with regulatory documentation for home oxygen)  Patient Saturations on Room Air at Rest = 96%  Patient Saturations on Room Air while Ambulating = 95%  Patient Saturations on 0 Liters of oxygen while Ambulating = 95%  Please briefly explain why patient needs home oxygen: pt not requiring o2 at this time

## 2020-12-05 NOTE — Progress Notes (Addendum)
   Subjective:   Patient sitting comfortably in her bedside chair, with no supplemental O2. She states that she did well overnight, she had a normal BM this AM. She feels much better compared to yesterday. She has no complaints at this time. All questions and concerns were addressed at bedside.   Objective:  Vital signs in last 24 hours: Vitals:   12/04/20 0800 12/04/20 1213 12/04/20 2016 12/05/20 0617  BP: 101/68 (!) 105/53 109/70 118/72  Pulse: (!) 57 67 (!) 53 (!) 53  Resp: 17 18 18    Temp:  98 F (36.7 C) 98 F (36.7 C) 98.7 F (37.1 C)  TempSrc:  Oral Oral Oral  SpO2: 95% 93% 91% 93%  Weight:      Height:       Physical Exam Constitutional:      General: She is not in acute distress.    Appearance: She is obese. She is not ill-appearing, toxic-appearing or diaphoretic.  Cardiovascular:     Rate and Rhythm: Normal rate and regular rhythm.     Pulses: Normal pulses.     Heart sounds: Normal heart sounds. No murmur heard. No friction rub. No gallop.   Pulmonary:     Effort: Pulmonary effort is normal.     Breath sounds: Normal breath sounds.  Neurological:     Mental Status: She is alert.     Assessment/Plan:  Principal Problem:   Pneumonia due to COVID-19 virus Active Problems:   Morbid obesity ([place])   Aortic atherosclerosis ([facility])   Acute on chronic respiratory failure with hypoxemia ([place])   COVID-19 with pulmonary comorbidity   Thrombocytopenia due to COVID-19 virus   Leukopenia  [name] [name] is a 65 year old female with history of OSA, asthma, GERD, depression, and anxiety who is on chronic 3L nightly prn presenting to the ED with shortness of breath, with recent diagnosis of COVID-19.  COVID-19 PNA Leukopenia Thrombocytopenia:  Currently at rest with no additional O2 supplement.  Lung sounds improved. Given no longer needing O2 at rest, would like to ambulate with pulse ox and if she tolerates ambulation, could proceed with discharge today.  -  Ambulate with Pulse Ox.   -PT/OT recommend Home Health -blood cultures no growth day 1  -inflammatory markers trending downward -ICS and flutter valve -combivent q6h scheduled  -decadron 6 mg daily 4/10 -lovenox ppx -robitussin q4h prn -tylenol prn -SSI, moderate. Sugars well controlled  Constipation Scheduled miralax and senna daily  Chest pain Troponins flat. Echo yesterday without acute changes from prior.  -echo with no acute changes when compared to prior.   Asthma OSA -Ventolin 2 puffs q6h -Breo ellipta 1 puff daily  GERD -continue protonix  Depression Anxiety On zoloft and trazodone at home for depression. On xanax BID for anxiety. -Continue home Zoloft 300mg  daily and trazadone 150 mg QHS PRN.  -Continue xanax BID prn  HLD -Continue Lipitor 10 mg  Prior to Admission Living Arrangement: Home Anticipated Discharge Location: Home Barriers to Discharge: Weakness and PT/OT evaluation Dispo: Anticipated discharge in approximately 0-1 days.   Maudie Mercury, MD 12/05/2020, 6:56 AM Pager: [phone] After 5pm on weekdays and 1pm on weekends: On Call pager [phone]

## 2020-12-05 NOTE — Discharge Instructions (Signed)
To [name] [name],  It was a pleasure taking care of you during your stay at [facility]. During your stay you were diagnosed with COVID 19 and were treated with steroids and routine breathing treatments. Please isolate for another 12 days before going outside. We will have you follow up in our clinic in 2 weeks time!   COVID-19: What to Do if You Are Sick If you have a fever, cough or other symptoms, you might have COVID-19. Most people have mild illness and are able to recover at home. If you are sick:  Keep track of your symptoms.  If you have an emergency warning sign (including trouble breathing), call 911. Steps to help prevent the spread of COVID-19 if you are sick If you are sick with COVID-19 or think you might have COVID-19, follow the steps below to care for yourself and to help protect other people in your home and community. Stay home except to get medical care  Stay home. Most people with COVID-19 have mild illness and can recover at home without medical care. Do not leave your home, except to get medical care. Do not visit public areas.  Take care of yourself. Get rest and stay hydrated. Take over-the-counter medicines, such as acetaminophen, to help you feel better.  Stay in touch with your doctor. Call before you get medical care. Be sure to get care if you have trouble breathing, or have any other emergency warning signs, or if you think it is an emergency.  Avoid public transportation, ride-sharing, or taxis. Separate yourself from other people As much as possible, stay in a specific room and away from other people and pets in your home. If possible, you should use a separate bathroom. If you need to be around other people or animals in or outside of the home, wear a mask. Tell your close contactsthat they may have been exposed to COVID-19. An infected person can spread COVID-19 starting 48 hours (or 2 days) before the person has any symptoms or tests positive. By letting your  close contacts know they may have been exposed to COVID-19, you are helping to protect everyone.  Additional guidance is available for those living in close quarters and shared housing.  See COVID-19 and Animals if you have questions about pets.  If you are diagnosed with COVID-19, someone from the health department may call you. Answer the call to slow the spread. Monitor your symptoms  Symptoms of COVID-19 include fever, cough, or other symptoms.  Follow care instructions from your healthcare provider and local health department. Your local health authorities may give instructions on checking your symptoms and reporting information. When to seek emergency medical attention Look for emergency warning signs* for COVID-19. If someone is showing any of these signs, seek emergency medical care immediately:  Trouble breathing  Persistent pain or pressure in the chest  New confusion  Inability to wake or stay awake  Pale, gray, or blue-colored skin, lips, or nail beds, depending on skin tone *This list is not all possible symptoms. Please call your medical provider for any other symptoms that are severe or concerning to you. Call 911 or call ahead to your local emergency facility: Notify the operator that you are seeking care for someone who has or may have COVID-19. Call ahead before visiting your doctor  Call ahead. Many medical visits for routine care are being postponed or done by phone or telemedicine.  If you have a medical appointment that cannot be postponed, call  your doctor's office, and tell them you have or may have COVID-19. This will help the office protect themselves and other patients. Get  tested  If you have symptoms of COVID-19, get tested. While waiting for test results, you stay away from others, including staying apart from those living in your household.  You can visit your state, tribal, local, and territorialhealth department's website to look for the latest  local information on testing sites. If you are sick, wear a mask over your nose and mouth  You should wear a mask over your nose and mouth if you must be around other people or animals, including pets (even at home).  You don't need to wear the mask if you are alone. If you can't put on a mask (because of trouble breathing, for example), cover your coughs and sneezes in some other way. Try to stay at least 6 feet away from other people. This will help protect the people around you.  Masks should not be placed on young children under age 25 years, anyone who has trouble breathing, or anyone who is not able to remove the mask without help. Note: During the COVID-19 pandemic, medical grade facemasks are reserved for healthcare workers and some first responders. Cover your coughs and sneezes  Cover your mouth and nose with a tissue when you cough or sneeze.  Throw away used tissues in a lined trash can.  Immediately wash your hands with soap and water for at least 20 seconds. If soap and water are not available, clean your hands with an alcohol-based hand sanitizer that contains at least 60% alcohol. Clean your hands often  Wash your hands often with soap and water for at least 20 seconds. This is especially important after blowing your nose, coughing, or sneezing; going to the bathroom; and before eating or preparing food.  Use hand sanitizer if soap and water are not available. Use an alcohol-based hand sanitizer with at least 60% alcohol, covering all surfaces of your hands and rubbing them together until they feel dry.  Soap and water are the best option, especially if hands are visibly dirty.  Avoid touching your eyes, nose, and mouth with unwashed hands.  Handwashing Tips Avoid sharing personal household items  Do not share dishes, drinking glasses, cups, eating utensils, towels, or bedding with other people in your home.  Wash these items thoroughly after using them with soap and  water or put in the dishwasher. Clean all "high-touch" surfaces everyday  Clean and disinfect high-touch surfaces in your "sick room" and bathroom; wear disposable gloves. Let someone else clean and disinfect surfaces in common areas, but you should clean your bedroom and bathroom, if possible.  If a caregiver or other person needs to clean and disinfect a sick person's bedroom or bathroom, they should do so on an as-needed basis. The caregiver/other person should wear a mask and disposable gloves prior to cleaning. They should wait as long as possible after the person who is sick has used the bathroom before coming in to clean and use the bathroom. ? High-touch surfaces include phones, remote controls, counters, tabletops, doorknobs, bathroom fixtures, toilets, keyboards, tablets, and bedside tables.  Clean and disinfect areas that may have blood, stool, or body fluids on them.  Use household cleaners and disinfectants. Clean the area or item with soap and water or another detergent if it is dirty. Then, use a household disinfectant. ? Be sure to follow the instructions on the label to ensure safe and  effective use of the product. Many products recommend keeping the surface wet for several minutes to ensure germs are killed. Many also recommend precautions such as wearing gloves and making sure you have good ventilation during use of the product. ? Use a product from [employer]'s List N: Disinfectants for Coronavirus (COVID-19). ? Complete Disinfection Guidance When you can be around others after being sick with COVID-19 Deciding when you can be around others is different for different situations. Find out when you can safely end home isolation. For any additional questions about your care, contact your healthcare provider or state or local health department. 01/22/2020 Content source: [facility]National Center for Immunization and Respiratory Diseases (NCIRD), Division of Viral Diseases This information is not  intended to replace advice given to you by your health care provider. Make sure you discuss any questions you have with your health care provider. Document Revised: 09/07/2020 Document Reviewed: 09/07/2020 Elsevier Patient Education  2021 Elsevier Inc.  COVID-19 Quarantine vs. Isolation QUARANTINE keeps someone who was in close contact with someone who has COVID-19 away from others. Quarantine if you have been in close contact with someone who has COVID-19, unless you have been fully vaccinated. If you are fully vaccinated  You do NOT need to quarantine unless they have symptoms  Get tested 3-5 days after your exposure, even if you don't have symptoms  Wear a mask indoors in public for 14 days following exposure or until your test result is negative If you are not fully vaccinated  Stay home for 14 days after your last contact with a person who has COVID-19  Watch for fever (100.42F), cough, shortness of breath, or other symptoms of COVID-19  If possible, stay away from people you live with, especially people who are at higher risk for getting very sick from COVID-19  Contact your local public health department for options in your area to possibly shorten your quarantine ISOLATION keeps someone who is sick or tested positive for COVID-19 without symptoms away from others, even in their own home. People who are in isolation should stay home and stay in a specific "sick room" or area and use a separate bathroom (if available). If you are sick and think or know you have COVID-19 Stay home until after  At least 10 days since symptoms first appeared and  At least 24 hours with no fever without the use of fever-reducing medications and  Symptoms have improved If you tested positive for COVID-19 but do not have symptoms  Stay home until after 10 days have passed since your positive viral test  If you develop symptoms after testing positive, follow the steps above for those who are  sick [email or web address] 08/03/2020 This information is not intended to replace advice given to you by your health care provider. Make sure you discuss any questions you have with your health care provider. Document Revised: 09/07/2020 Document Reviewed: 09/07/2020 Elsevier Patient Education  2021 Elsevier Inc.  10 Things You Can Do to Manage Your COVID-19 Symptoms at Home If you have possible or confirmed COVID-19: 1. Stay home except to get medical care. 2. Monitor your symptoms carefully. If your symptoms get worse, call your healthcare provider immediately. 3. Get rest and stay hydrated. 4. If you have a medical appointment, call the healthcare provider ahead of time and tell them that you have or may have COVID-19. 5. For medical emergencies, call 911 and notify the dispatch personnel that you have or may have COVID-19. 6. Cover your cough and  sneezes with a tissue or use the inside of your elbow. 7. Wash your hands often with soap and water for at least 20 seconds or clean your hands with an alcohol-based hand sanitizer that contains at least 60% alcohol. 8. As much as possible, stay in a specific room and away from other people in your home. Also, you should use a separate bathroom, if available. If you need to be around other people in or outside of the home, wear a mask. 9. Avoid sharing personal items with other people in your household, like dishes, towels, and bedding. 10. Clean all surfaces that are touched often, like counters, tabletops, and doorknobs. Use household cleaning sprays or wipes according to the label instructions. [email or web address] 05/22/2020 This information is not intended to replace advice given to you by your health care provider. Make sure you discuss any questions you have with your health care provider. Document Revised: 09/07/2020 Document Reviewed: 09/07/2020 Elsevier Patient Education  2021 [employer].  How to Wear and Take Off Your Mask How to  put on and wear your mask correctly  Wash your hands or use hand sanitizer before putting on your mask  Put it over your face and mouth  Be sure your mask fits snugly against the sides of your face and under your chin  Make sure you can breathe easily Wear a mask to protect yourself and others  Wear a mask over your nose and mouth to help prevent getting and spreading COVID-19  Wear a mask in public settings, especially when you cannot stay six feet apart from people who don't live with you How to take off your mask  Untie the strings behind your head or stretch the ear loops  Handle only by the ear loops or ties  Fold outside corners together  Wash hands immediately after removing Other ways to protect yourself  Stay at least 6 feet away from others  Avoid crowds and places with poor ventilation  Wash your hands often  Get a vaccine when it is offered [email or web address] 02/06/2020 This information is not intended to replace advice given to you by your health care provider. Make sure you discuss any questions you have with your health care provider. Document Revised: 09/07/2020 Document Reviewed: 09/07/2020 Elsevier Patient Education  2021 [employer].

## 2020-12-05 NOTE — Progress Notes (Addendum)
Nsg Discharge Note  Admit Date:  12/01/2020 Discharge date: 12/05/2020   [name] [name] to be D/C'd Home per MD order.  AVS completed.    Discharge Medication: Allergies as of 12/05/2020      Reactions   Ibuprofen Anaphylaxis, Hives, Swelling   Throat swells   Clonazepam Other (See Comments)   Dizziness      Medication List    STOP taking these medications   nystatin powder Generic drug: nystatin     TAKE these medications   acetaminophen 500 MG tablet Commonly known as: TYLENOL Take 1,000 mg by mouth every 6 (six) hours as needed (for pain OR back pain).   albuterol 108 (90 Base) MCG/ACT inhaler Commonly known as: VENTOLIN HFA Inhale 2 puffs into the lungs every 6 (six) hours as needed for wheezing or shortness of breath.   ALPRAZolam 1 MG tablet Commonly known as: XANAX Take 1 mg by mouth at bedtime.   ALPRAZolam 1 MG tablet Commonly known as: XANAX Take 0.5-1 tablets (0.5-1 mg total) by mouth 2 (two) times daily as needed. for anxiety   atorvastatin 10 MG tablet Commonly known as: LIPITOR Take 1 tablet (10 mg total) by mouth daily.   famotidine 10 MG tablet Commonly known as: PEPCID Take 1 tablet (10 mg total) by mouth 2 (two) times daily. May increase to 2 pills twice a day if no improvement after 2 weeks   fluticasone furoate-vilanterol 100-25 MCG/INH Aepb Commonly known as: BREO ELLIPTA Inhale 1 puff into the lungs daily.   guaiFENesin-dextromethorphan 100-10 MG/5ML syrup Commonly known as: ROBITUSSIN DM Take 10 mLs by mouth every 4 (four) hours as needed for cough.   meloxicam 7.5 MG tablet Commonly known as: MOBIC TAKE 1 TABLET BY MOUTH DAILY AS NEEDED What changed:   how much to take  how to take this  when to take this  reasons to take this   methocarbamol 500 MG tablet Commonly known as: ROBAXIN Take 1 tablet (500 mg total) by mouth every 8 (eight) hours as needed for muscle spasms.   pantoprazole 20 MG tablet Commonly known as:  Protonix Take 1 tablet (20 mg total) by mouth daily.   sertraline 100 MG tablet Commonly known as: Zoloft Take 3 tablets (300 mg total) by mouth daily.   traZODone 150 MG tablet Commonly known as: DESYREL Take 1 tablet (150 mg total) by mouth at bedtime as needed for sleep.            Durable Medical Equipment  (From admission, onward)         Start     Ordered   12/05/20 1359  DME 3-in-1  Once        12/05/20 1402   12/05/20 1359  DME Shower stool  Once        12/05/20 1402          Discharge Assessment: Vitals:   12/04/20 2016 12/05/20 0617  BP: 109/70 118/72  Pulse: (!) 53 (!) 53  Resp: 18   Temp: 98 F (36.7 C) 98.7 F (37.1 C)  SpO2: 91% 93%   Skin clean, dry and intact without evidence of skin break down, no evidence of skin tears noted. IV catheter discontinued intact. Site without signs and symptoms of complications - no redness or edema noted at insertion site, patient denies c/o pain - only slight tenderness at site.  Dressing with slight pressure applied.  D/c Instructions-Education: Discharge instructions given to patient/family with verbalized understanding. D/c education  completed with patient/family including follow up instructions, medication list, d/c activities limitations if indicated, with other d/c instructions as indicated by MD - patient able to verbalize understanding, all questions fully answered. Patient instructed to return to ED, call 911, or call MD for any changes in condition. 3-in-1 was delivered to room and sent home with patient.  Patient escorted via [place], and D/C home via private auto.  Hiram Comber, RN 12/05/2020 5:02 PM

## 2020-12-05 NOTE — Progress Notes (Signed)
Occupational Therapy Treatment Patient Details Name: [name] [name] MRN: [id number] DOB: [birth year] Today's Date: 12/05/2020    History of present illness [name] [name] is a 65 year old female with history of OSA, asthma, GERD, depression, and anxiety who is on chronic 3L nightly prn presenting to the ED with shortness of breath.  She also developed diarrhea, chills and night sweats.  She was tested for COVID-19 infection on 11/28/2020 and was positive.   OT comments  Pt supine in bed upon arrival pleasant and willing to participate in therapy session. Pt performing x2 bouts of mobility in room using RW at supervision level and with seated rest break in between. Additional focus on further education/review of activity pacing and energy conservation techniques in relation to mobility/functional tasks in prep for d/c home as pt anticipating to d/c home (she is hopeful for today). Pt verbalizing good understanding with handout provided as well. Continue to recommend [facility] services and [facility] aide after discharge. Acute OT to follow.   Follow Up Recommendations  Home health OT;Other (comment) (aide)    Equipment Recommendations  Tub/shower bench;3 in 1 bedside commode          Precautions / Restrictions Precautions Precautions: Fall Precaution Comments: O2       Mobility Bed Mobility Overal bed mobility: Modified Independent                Transfers Overall transfer level: Needs assistance Equipment used: Rolling walker (2 wheeled) Transfers: Sit to/from Stand Sit to Stand: Supervision         General transfer comment: for lines and safety    Balance Overall balance assessment: Needs assistance   Sitting balance-Leahy Scale: Good     Standing balance support: During functional activity;Single extremity supported Standing balance-Leahy Scale: Poor Standing balance comment: requires UE support                           ADL either performed or  assessed with clinical judgement   ADL Overall ADL's : Needs assistance/impaired                                     Functional mobility during ADLs: Min guard;Supervision/safety;Rolling walker General ADL Comments: session included energy conservation education and activity pacing, handout provided                       Cognition Arousal/Alertness: Awake/alert Behavior During Therapy: WFL for tasks assessed/performed Overall Cognitive Status: Within Functional Limits for tasks assessed                                          Exercises     Shoulder Instructions       General Comments      Pertinent Vitals/ Pain       Pain Assessment: Faces Faces Pain Scale: No hurt Pain Intervention(s): Monitored during session  Home Living                                          Prior Functioning/Environment              Frequency  Min 2X/week  Progress Toward Goals  OT Goals(current goals can now be found in the care plan section)  Progress towards OT goals: Progressing toward goals  Acute Rehab OT Goals Patient Stated Goal: to regain strength OT Goal Formulation: With patient Time For Goal Achievement: 12/17/20 Potential to Achieve Goals: Good ADL Goals Additional ADL Goal #1: Pt will independently incorporate energy conservation strategies during ADLs Additional ADL Goal #2: Pt will actively participate in 25 mins therapeutic activity with no more than 3 rest breaks, and DOE no >3/4 and VSS Additional ADL Goal #3: Pt will be mod I with ADLs using RW  Plan Discharge plan remains appropriate    Co-evaluation                 AM-PAC OT "6 Clicks" Daily Activity     Outcome Measure   Help from another person eating meals?: None Help from another person taking care of personal grooming?: A Little Help from another person toileting, which includes using toliet, bedpan, or urinal?: A Little Help  from another person bathing (including washing, rinsing, drying)?: A Little Help from another person to put on and taking off regular upper body clothing?: A Little Help from another person to put on and taking off regular lower body clothing?: A Little 6 Click Score: 19    End of Session Equipment Utilized During Treatment: Rolling walker  OT Visit Diagnosis: Unsteadiness on feet (R26.81)   Activity Tolerance Patient tolerated treatment well   Patient Left with call bell/phone within reach;in bed   Nurse Communication Mobility status        Time: 1441-1501 OT Time Calculation (min): 20 min  Charges: OT General Charges $OT Visit: 1 Visit OT Treatments $Self Care/Home Management : 8-22 mins  Lou Cal, OT Acute Rehabilitation Services Pager [phone] Office [phone]   Raymondo Band 12/05/2020, 4:13 PM

## 2020-12-05 NOTE — TOC Transition Note (Signed)
Transition of Care [facility]) - CM/SW Discharge Note   Patient Details  Name: [name] [name] MRN: [id number] Date of Birth: [birth year]  [facility] CM/SW Contact:  Carles Collet, RN Phone Number: 12/05/2020, 2:23 PM   Clinical Narrative:    Could not reach patient, spoke w spouse over the phone. Confirms she has RW and home oxygen through [place]. He will bring O2 for ride home when he comes to get her. [facility] services set up through [place]. 3/1 to be delivered to the room before patient leaves.     Final next level of care: [place] Barriers to Discharge: No Barriers Identified   Patient Goals and CMS Choice Patient states their goals for this hospitalization and ongoing recovery are:: to go home CMS Medicare.gov Compare Post Acute Care list provided to:: Other (Comment Required) Choice offered to / list presented to : Spouse  Discharge Placement                       Discharge Plan and Services                DME Arranged: 3-N-1 DME Agency: [employer] Date DME Agency Contacted: 12/05/20 Time DME Agency Contacted: [phone] Representative spoke with at DME Agency: [place]: PT,OT [place]: [place] Date [place]: 12/05/20 Time [place]: [id number] Representative spoke with at [place]: [place] ([place]) Interventions     Readmission Risk Interventions No flowsheet data found.

## 2020-12-06 LAB — CULTURE, BLOOD (ROUTINE X 2): Special Requests: ADEQUATE

## 2020-12-06 NOTE — Discharge Summary (Signed)
Name: [name] [name] MRN: [id number] DOB: 1955-11-15 65 y.o. PCP: Iona Beard, MD  Date of Admission: 12/01/2020  2:20 PM Date of Discharge: 12/05/2020 Attending Physician: Aldine Contes Discharge Diagnosis: 1. COVID-19PNA Leukopenia Thrombocytopenia  2. Constipation  3. Chest Pain  Discharge Medications: Allergies as of 12/05/2020      Reactions   Ibuprofen Anaphylaxis, Hives, Swelling   Throat swells   Clonazepam Other (See Comments)   Dizziness      Medication List    STOP taking these medications   nystatin powder Generic drug: nystatin     TAKE these medications   acetaminophen 500 MG tablet Commonly known as: TYLENOL Take 1,000 mg by mouth every 6 (six) hours as needed (for pain OR back pain).   albuterol 108 (90 Base) MCG/ACT inhaler Commonly known as: VENTOLIN HFA Inhale 2 puffs into the lungs every 6 (six) hours as needed for wheezing or shortness of breath.   ALPRAZolam 1 MG tablet Commonly known as: XANAX Take 1 mg by mouth at bedtime.   ALPRAZolam 1 MG tablet Commonly known as: XANAX Take 0.5-1 tablets (0.5-1 mg total) by mouth 2 (two) times daily as needed. for anxiety   atorvastatin 10 MG tablet Commonly known as: LIPITOR Take 1 tablet (10 mg total) by mouth daily.   famotidine 10 MG tablet Commonly known as: PEPCID Take 1 tablet (10 mg total) by mouth 2 (two) times daily. May increase to 2 pills twice a day if no improvement after 2 weeks   fluticasone furoate-vilanterol 100-25 MCG/INH Aepb Commonly known as: BREO ELLIPTA Inhale 1 puff into the lungs daily.   guaiFENesin-dextromethorphan 100-10 MG/5ML syrup Commonly known as: ROBITUSSIN DM Take 10 mLs by mouth every 4 (four) hours as needed for cough.   meloxicam 7.5 MG tablet Commonly known as: MOBIC TAKE 1 TABLET BY MOUTH DAILY AS NEEDED What changed:   how much to take  how to take this  when to take this  reasons to take this   methocarbamol 500 MG  tablet Commonly known as: ROBAXIN Take 1 tablet (500 mg total) by mouth every 8 (eight) hours as needed for muscle spasms.   pantoprazole 20 MG tablet Commonly known as: Protonix Take 1 tablet (20 mg total) by mouth daily.   sertraline 100 MG tablet Commonly known as: Zoloft Take 3 tablets (300 mg total) by mouth daily.   traZODone 150 MG tablet Commonly known as: DESYREL Take 1 tablet (150 mg total) by mouth at bedtime as needed for sleep.       Disposition and follow-up:   Ms.Raynah Maness Barclift was discharged from [facility] in Stable condition.  At the hospital follow up visit please address:  1. COVID-19PNA Leukopenia Thrombocytopenia  - Additional O2 requirement  2. Constipation  - Continue Miralax   3. Chest Pain  - NTD  2.  Labs / imaging needed at time of follow-up: CBC  3.  Pending labs/ test needing follow-up: None  Follow-up Appointments:  Follow-up Information    [facility] Follow up.   Specialty: [place] Why: for home health services Contact information: [place] [id number] [phone]               Hospital Course by problem list: 1. COVID-19PNA Leukopenia Thrombocytopenia Patient, on nightly baseline supplemental 3L O2, Presented with myalgias, non productive cough, sore throat, mild nasal congestion, diarrhea, and night sweats. She was found to be COVID  positive. Started on decadron, combivent, and one dose of remdesivir. Her O2 was weaned down to RA, and discharged in stable condition.   2. Constipation Patient was brought into the ED with diarrhea associated with covid had not had a bowel movement in several days. Was started on miralax and senokot-S, and had a BM the day of discharge.   3. Chest Pain Patient presented to the ED with intermittent chest pain on the R side that bagen after her COVID 19 diagnosis. 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 Troponin 6>5. D dimer was mildly elevated and CTA chest did not show PE. Echo was unrevealing. Discharged in stable condition.    Pertinent Labs, Studies, and Procedures:  LIMITED ECHO 1. Left ventricular ejection fraction, by estimation, is 55 to 60%. The  left ventricle has normal function. The left ventricle has no regional  wall motion abnormalities.  2. Right ventricular systolic function is normal. The right ventricular  size is normal.  3. The mitral valve is normal in structure. Trivial mitral valve  regurgitation. No evidence of mitral stenosis.  4. The aortic valve is normal in structure. Aortic valve regurgitation is  not visualized. No aortic stenosis is present.  5. The inferior vena cava is normal in size with greater than 50%  respiratory variability, suggesting right atrial pressure of 3 mmHg.   FINDINGS  Left Ventricle: Left ventricular ejection fraction, by estimation, is 55  to 60%. The left ventricle has normal function. The left ventricle has no  regional wall motion abnormalities. Definity contrast agent was given IV  to delineate the left ventricular  endocardial borders. The left ventricular internal cavity size was normal  in size. There is no left ventricular hypertrophy.   Component Ref Range & Units 1 d ago 2 d ago 3 d ago 4 d ago 5 d ago 6 mo ago 1 yr ago  Sodium 135 - 145 mmol/L 141  142  142  136  138  139  140 R   Potassium 3.5 - 5.1 mmol/L 4.0  3.3Low  3.6  3.8  3.4Low  4.1  4.4 R   Chloride 98 - 111 mmol/L 105  104  104  99  99  101  99 R   CO2 22 - 32 mmol/L 28  28  29  26  28  28  23  R   Glucose, Bld 70 - 99 mg/dL 98  92 CM  101High CM  167High CM  132High CM  110High CM  101High R   Comment: Glucose reference range applies only to samples taken after fasting for at least 8 hours.  BUN 8 - 23 mg/dL 11  13  12  9  8   7Low  8 R   Creatinine, Ser 0.44 - 1.00 mg/dL 0.62  0.62  0.62  0.51  0.62  0.59   0.71 R   Calcium 8.9 - 10.3 mg/dL 8.6Low  8.4Low  8.5Low  8.9  8.6Low  9.4  9.2 R   GFR, Estimated >60 mL/min >60  >60 CM  >60 CM  >60 CM  >60 CM       Notes  Component Ref Range & Units 1 d ago 2 d ago 3 d ago 4 d ago 2 yr ago  CRP <1.0 mg/dL 0.7  1.1High CM         EXAM: CT ANGIOGRAPHY CHEST WITH CONTRAST  TECHNIQUE: Multidetector CT imaging of  the chest was performed using the standard protocol during bolus administration of intravenous contrast. Multiplanar CT image reconstructions and MIPs were obtained to evaluate the vascular anatomy.  CONTRAST:  14mL OMNIPAQUE IOHEXOL 350 MG/ML SOLN  COMPARISON:  08/08/2016  FINDINGS: Cardiovascular: There is adequate opacification of the pulmonary arterial tree. No intraluminal filling defect identified to suggest acute pulmonary embolism. Central pulmonary arteries are of normal caliber. Mild coronary artery calcification. Global cardiac size within normal limits. No pericardial effusion. Mild atherosclerotic calcification within the thoracic aorta. No aortic aneurysm.  Mediastinum/Nodes: Visualized thyroid is unremarkable. No pathologic thoracic adenopathy. The esophagus is unremarkable.  Lungs/Pleura: The lungs are symmetrically well expanded. There are mild bilateral perihilar and peripheral diffusely distributed ground-glass pulmonary infiltrates most in keeping with atypical infection or inflammation and compatible with given history of COVID-19 pneumonia. Mild associated bronchial wall thickening in keeping with airway inflammation. No pneumothorax or pleural effusion. No central obstructing lesion.  Upper Abdomen: No acute abnormality.  Musculoskeletal: No acute bone abnormality. No lytic or blastic bone lesion identified.  Review of the MIP images confirms the above findings.  IMPRESSION: No pulmonary embolism.  Mild bilateral pulmonary infiltrates most in keeping with atypical infection and  compatible with the given history of COVID-19 pneumonia. Moderate parenchymal involvement.  Mild coronary artery calcification.  Aortic Atherosclerosis (ICD10-I70.0).  Discharge Instructions: Discharge Instructions    Call MD for:  difficulty breathing, headache or visual disturbances   Complete by: As directed    Call MD for:  extreme fatigue   Complete by: As directed    Call MD for:  persistant dizziness or light-headedness   Complete by: As directed    Diet - low sodium heart healthy   Complete by: As directed    Increase activity slowly   Complete by: As directed       Signed: Maudie Mercury, MD 12/06/2020, 4:34 PM   Pager: [phone]

## 2020-12-07 LAB — CULTURE, BLOOD (ROUTINE X 2): Culture: NO GROWTH

## 2020-12-09 DIAGNOSIS — I700 Atherosclerosis of aorta: Secondary | ICD-10-CM | POA: Diagnosis not present

## 2020-12-09 DIAGNOSIS — I2510 Atherosclerotic heart disease of native coronary artery without angina pectoris: Secondary | ICD-10-CM | POA: Diagnosis not present

## 2020-12-09 DIAGNOSIS — D649 Anemia, unspecified: Secondary | ICD-10-CM | POA: Diagnosis not present

## 2020-12-09 DIAGNOSIS — J1282 Pneumonia due to coronavirus disease 2019: Secondary | ICD-10-CM | POA: Diagnosis not present

## 2020-12-09 DIAGNOSIS — I4510 Unspecified right bundle-branch block: Secondary | ICD-10-CM | POA: Diagnosis not present

## 2020-12-09 DIAGNOSIS — M545 Low back pain, unspecified: Secondary | ICD-10-CM | POA: Diagnosis not present

## 2020-12-09 DIAGNOSIS — U071 COVID-19: Secondary | ICD-10-CM | POA: Diagnosis not present

## 2020-12-09 DIAGNOSIS — J45909 Unspecified asthma, uncomplicated: Secondary | ICD-10-CM | POA: Diagnosis not present

## 2020-12-09 DIAGNOSIS — J9621 Acute and chronic respiratory failure with hypoxia: Secondary | ICD-10-CM | POA: Diagnosis not present

## 2020-12-11 DIAGNOSIS — U071 COVID-19: Secondary | ICD-10-CM | POA: Diagnosis not present

## 2020-12-11 DIAGNOSIS — I4510 Unspecified right bundle-branch block: Secondary | ICD-10-CM | POA: Diagnosis not present

## 2020-12-11 DIAGNOSIS — J1282 Pneumonia due to coronavirus disease 2019: Secondary | ICD-10-CM | POA: Diagnosis not present

## 2020-12-11 DIAGNOSIS — J45909 Unspecified asthma, uncomplicated: Secondary | ICD-10-CM | POA: Diagnosis not present

## 2020-12-11 DIAGNOSIS — I700 Atherosclerosis of aorta: Secondary | ICD-10-CM | POA: Diagnosis not present

## 2020-12-11 DIAGNOSIS — J9621 Acute and chronic respiratory failure with hypoxia: Secondary | ICD-10-CM | POA: Diagnosis not present

## 2020-12-11 DIAGNOSIS — M545 Low back pain, unspecified: Secondary | ICD-10-CM | POA: Diagnosis not present

## 2020-12-11 DIAGNOSIS — D649 Anemia, unspecified: Secondary | ICD-10-CM | POA: Diagnosis not present

## 2020-12-11 DIAGNOSIS — I2510 Atherosclerotic heart disease of native coronary artery without angina pectoris: Secondary | ICD-10-CM | POA: Diagnosis not present

## 2020-12-14 ENCOUNTER — Other Ambulatory Visit: Payer: Self-pay | Admitting: Physician Assistant

## 2020-12-14 ENCOUNTER — Other Ambulatory Visit: Payer: Self-pay | Admitting: Student

## 2020-12-14 DIAGNOSIS — K219 Gastro-esophageal reflux disease without esophagitis: Secondary | ICD-10-CM

## 2020-12-28 ENCOUNTER — Other Ambulatory Visit: Payer: Self-pay | Admitting: Physician Assistant

## 2021-01-02 DIAGNOSIS — J9610 Chronic respiratory failure, unspecified whether with hypoxia or hypercapnia: Secondary | ICD-10-CM | POA: Diagnosis not present

## 2021-01-25 ENCOUNTER — Telehealth: Payer: Self-pay

## 2021-01-25 NOTE — Telephone Encounter (Signed)
She would need an appointment for further evaluation of this prior to me writing a letter.  Front desk--please arrange an appointment with one of our providers at pt's earliest convenience. Thank you

## 2021-01-25 NOTE — Telephone Encounter (Signed)
Pls contact pt [phone] pt would like a note so she won't have to attend jury duty

## 2021-01-25 NOTE — Telephone Encounter (Signed)
Returned call to patient. States she received a letter for jury Duty in [employer]. Letter stated she could have doctor write a letter to excuse her if she didn't think she was able to serve. States she doesn't think she can 2/2 difficulty breathing and trouble with balance. Gets SHOB with walking and uses cane.

## 2021-01-26 NOTE — Telephone Encounter (Signed)
Thanks for following that up

## 2021-01-26 NOTE — Telephone Encounter (Signed)
Just spoke with patient to inform her that she will need an appointment to be seen before a letter can be written.  States she will call back next month in April, since the jury duty letter is for her to serve in May.  Forwarding back to blue team.

## 2021-01-30 DIAGNOSIS — J9610 Chronic respiratory failure, unspecified whether with hypoxia or hypercapnia: Secondary | ICD-10-CM | POA: Diagnosis not present

## 2021-02-23 ENCOUNTER — Encounter: Payer: Self-pay | Admitting: *Deleted

## 2021-02-23 NOTE — Progress Notes (Unsigned)

## 2021-03-02 DIAGNOSIS — J9610 Chronic respiratory failure, unspecified whether with hypoxia or hypercapnia: Secondary | ICD-10-CM | POA: Diagnosis not present

## 2021-03-05 NOTE — Progress Notes (Unsigned)
Things That May Be Affecting Your Health:  Alcohol  Hearing loss  Pain   x Depression  Home Safety  Sexual Health   Diabetes  Lack of physical activity  Stress   Difficulty with daily activities  Loneliness  Tiredness   Drug use  Medicines  Tobacco use   Falls  Motor Vehicle Safety x Weight   Food choices  Oral Health  Other    YOUR PERSONALIZED HEALTH PLAN : 1. Schedule your next subsequent Medicare Wellness visit in one year 2. Attend all of your regular appointments to address your medical issues 3. Complete the preventative screenings and services   Annual Wellness Visit   Medicare Covered Preventative Screenings and [place] Men and Women Who How Often Need? Date of Last Service Action  Abdominal Aortic Aneurysm Adults with AAA risk factors Once      Alcohol Misuse and Counseling All Adults Screening once a year if no alcohol misuse. Counseling up to 4 face to face sessions.     Bone Density Measurement  Adults at risk for osteoporosis Once every 2 yrs      Lipid Panel Z13.6 All adults without CV disease Once every 5 yrs x      Colorectal Cancer   Stool sample or  Colonoscopy All adults 31 and older   Once every year  Every 10 years x       Depression All Adults Once a year x Today   Diabetes Screening Blood glucose, post glucose load, or GTT Z13.1  All adults at risk  Pre-diabetics  Once per year  Twice per year      Diabetes  Self-Management Training All adults Diabetics 10 hrs first year; 2 hours subsequent years. Requires Copay     Glaucoma  Diabetics  Family history of glaucoma  African Americans 55 yrs +  Hispanic Americans 54 yrs + Annually - requires coppay      Hepatitis C Z72.89 or F19.20  High Risk for HCV  Born between 1945 and 1965  Annually  Once      HIV Z11.4 All adults based on risk  Annually btw ages 39 & 65 regardless of risk  Annually > 65 yrs if at increased risk      Lung Cancer Screening  Asymptomatic adults aged 65-77 with 30 pack yr history and current smoker OR quit within the last 15 yrs Annually Must have counseling and shared decision making documentation before first screen x     Medical Nutrition Therapy Adults with   Diabetes  Renal disease  Kidney transplant within past 3 yrs 3 hours first year; 2 hours subsequent years     Obesity and Counseling All adults Screening once a year Counseling if BMI 30 or higher  Today   Tobacco Use Counseling Adults who use tobacco  Up to 8 visits in one year     Vaccines Z23  Hepatitis B  Influenza   Pneumonia  Adults   Once  Once every flu season  Two different vaccines separated by one year     Next Annual Wellness Visit People with Medicare Every year  Today     Services & Screenings Women Who How Often Need  Date of Last Service Action  Mammogram  Z12.31 Women over 83 One baseline ages 70-39. Annually ager 40 yrs+ x     Pap tests All women Annually if high risk. Every 2 yrs for normal risk women  Screening for cervical cancer with   Pap (Z01.419 nl or Z01.411abnl) &  HPV Z11.51 Women aged 106 to 35 Once every 5 yrs     Screening pelvic and breast exams All women Annually if high risk. Every 2 yrs for normal risk women     Sexually Transmitted Diseases  Chlamydia  Gonorrhea  Syphilis All at risk adults Annually for non pregnant females at increased risk         [place] Men Who How Ofter Need  Date of Last Service Action  Prostate Cancer - DRE & PSA Men over 50 Annually.  DRE might require a copay.        Sexually Transmitted Diseases  Syphilis All at risk adults Annually for men at increased risk      Health Maintenance List Health Maintenance  Topic Date Due  . COVID-19 Vaccine (1) Never done  . COLON CANCER SCREENING ANNUAL FOBT  05/31/2013  . COLONOSCOPY (Pts 45-26yrs Insurance coverage will need to be confirmed)  05/31/2013  . DEXA SCAN  Never done  . PNA vac Low  Risk Adult (1 of 2 - PCV13) 09/24/2020  . MAMMOGRAM  11/20/2020  . INFLUENZA VACCINE  06/07/2021  . TETANUS/TDAP  01/19/2028  . Hepatitis C Screening  Completed  . HIV Screening  Completed  . HPV VACCINES  Aged Out

## 2021-03-24 DIAGNOSIS — M170 Bilateral primary osteoarthritis of knee: Secondary | ICD-10-CM | POA: Diagnosis not present

## 2021-04-01 DIAGNOSIS — J9610 Chronic respiratory failure, unspecified whether with hypoxia or hypercapnia: Secondary | ICD-10-CM | POA: Diagnosis not present

## 2021-04-14 DIAGNOSIS — Z20822 Contact with and (suspected) exposure to covid-19: Secondary | ICD-10-CM | POA: Diagnosis not present

## 2021-05-02 DIAGNOSIS — J9610 Chronic respiratory failure, unspecified whether with hypoxia or hypercapnia: Secondary | ICD-10-CM | POA: Diagnosis not present

## 2021-05-17 ENCOUNTER — Ambulatory Visit: Payer: Medicare HMO | Admitting: Physician Assistant

## 2021-05-19 ENCOUNTER — Telehealth: Payer: Self-pay

## 2021-05-19 DIAGNOSIS — Z1231 Encounter for screening mammogram for malignant neoplasm of breast: Secondary | ICD-10-CM

## 2021-05-19 NOTE — Telephone Encounter (Signed)
Spoke to patient and she would like to be scheduled to have her mammogram done during the mobile mammogram event on July 28, 2021. Order has been placed.

## 2021-05-21 ENCOUNTER — Other Ambulatory Visit: Payer: Self-pay | Admitting: Physician Assistant

## 2021-05-24 NOTE — Telephone Encounter (Signed)
Please review

## 2021-05-31 ENCOUNTER — Other Ambulatory Visit: Payer: Self-pay

## 2021-05-31 MED ORDER — ATORVASTATIN CALCIUM 10 MG PO TABS: 10.0000 mg | ORAL_TABLET | Freq: Every day | ORAL | 3 refills | Status: DC

## 2021-06-01 ENCOUNTER — Telehealth: Payer: Self-pay

## 2021-06-01 DIAGNOSIS — J9610 Chronic respiratory failure, unspecified whether with hypoxia or hypercapnia: Secondary | ICD-10-CM | POA: Diagnosis not present

## 2021-06-01 NOTE — Telephone Encounter (Signed)
Prior Authorization submitted and Approved for SERTRALINE 100 MG #[id number], effective 05/28/2021-11/06/2021, PA# [id number], [employer] H# [id number]

## 2021-06-22 ENCOUNTER — Ambulatory Visit: Payer: Medicare HMO | Admitting: Physician Assistant

## 2021-06-25 ENCOUNTER — Other Ambulatory Visit: Payer: Self-pay | Admitting: Student

## 2021-06-25 DIAGNOSIS — Z139 Encounter for screening, unspecified: Secondary | ICD-10-CM

## 2021-06-29 ENCOUNTER — Encounter: Payer: Self-pay | Admitting: Pharmacist

## 2021-06-29 ENCOUNTER — Ambulatory Visit (INDEPENDENT_AMBULATORY_CARE_PROVIDER_SITE_OTHER): Payer: Medicare HMO | Admitting: Pharmacist

## 2021-06-29 DIAGNOSIS — Z0000 Encounter for general adult medical examination without abnormal findings: Secondary | ICD-10-CM | POA: Diagnosis not present
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
## 2021-07-02 DIAGNOSIS — J9610 Chronic respiratory failure, unspecified whether with hypoxia or hypercapnia: Secondary | ICD-10-CM | POA: Diagnosis not present

## 2021-07-05 NOTE — Patient Instructions (Addendum)
Things That May Be Affecting Your Health:   Alcohol   Hearing loss   Pain   x Depression   Home Safety   Sexual Health    Diabetes   Lack of physical activity   Stress    Difficulty with daily activities   Loneliness   Tiredness    Drug use   Medicines   Tobacco use    Falls   Motor Vehicle Safety x Weight    Food choices   Oral Health   Other      YOUR PERSONALIZED HEALTH PLAN : 1. Schedule your next subsequent Medicare Wellness visit in one year 2. Attend all of your regular appointments to address your medical issues 3. Complete the preventative screenings and services 4. Consider obtaining your shingles and pneumonia vaccines. 5. You are due for your DEXA Scan to check for osteoporosis     Annual Wellness Visit                       Medicare Covered Preventative Screenings and [place] Men and Women Who How Often Need? Date of Last Service Action  Abdominal Aortic Aneurysm Adults with AAA risk factors Once        Alcohol Misuse and Counseling All Adults Screening once a year if no alcohol misuse. Counseling up to 4 face to face sessions.        Bone Density Measurement  Adults at risk for osteoporosis Once every 2 yrs        Lipid Panel Z13.6 All adults without CV disease Once every 5 yrs x          Colorectal Cancer  Stool sample or Colonoscopy All adults 97 and older   Once every year Every 10 years x            Depression All Adults Once a year x Today    Diabetes Screening Blood glucose, post glucose load, or GTT Z13.1 All adults at risk Pre-diabetics Once per year Twice per year          Diabetes  Self-Management Training All adults Diabetics 10 hrs first year; 2 hours subsequent years. Requires Copay        Glaucoma Diabetics Family history of glaucoma African Americans 68 yrs + Hispanic Americans 27 yrs + Annually - requires coppay          Hepatitis C Z72.89 or F19.20 High Risk for HCV Born between 1945 and 1965 Annually Once           HIV Z11.4 All adults based on risk Annually btw ages 63 & 61 regardless of risk Annually > 65 yrs if at increased risk          Lung Cancer Screening Asymptomatic adults aged 38-77 with 30 pack yr history and current smoker OR quit within the last 15 yrs Annually Must have counseling and shared decision making documentation before first screen x        Medical Nutrition Therapy Adults with  Diabetes Renal disease Kidney transplant within past 3 yrs 3 hours first year; 2 hours subsequent years        Obesity and Counseling All adults Screening once a year Counseling if BMI 30 or higher   Today    Tobacco Use Counseling Adults who use tobacco  Up to 8 visits in one year        Vaccines Z23 Hepatitis B Influenza  Pneumonia  Adults  Once Once every flu season Two different vaccines separated by one year        Next Annual Wellness Visit People with Medicare Every year   Today        [place] Women Who How Often Need  Date of Last Service Action  Mammogram  Z12.31 Women over 13 One baseline ages 68-39. Annually ager 40 yrs+ x        Pap tests All women Annually if high risk. Every 2 yrs for normal risk women          Screening for cervical cancer with  Pap (Z01.419 nl or Z01.411abnl) & HPV Z11.51 Women aged 28 to 63 Once every 5 yrs        Screening pelvic and breast exams All women Annually if high risk. Every 2 yrs for normal risk women        Sexually Transmitted Diseases Chlamydia Gonorrhea Syphilis All at risk adults Annually for non pregnant females at increased risk                [place] Men Who How Ofter Need  Date of Last Service Action  Prostate Cancer - DRE & PSA Men over 50 Annually.  DRE might require a copay.              Sexually Transmitted Diseases Syphilis All at risk adults Annually for men at increased risk          Health Maintenance List     Health Maintenance  Topic Date Due  . COVID-19 Vaccine (1)  Never done  . COLON CANCER SCREENING ANNUAL FOBT  05/31/2013  . COLONOSCOPY (Pts 45-33yr Insurance coverage will need to be confirmed)  05/31/2013  . DEXA SCAN  Never done  . PNA vac Low Risk Adult (1 of 2 - PCV13) 09/24/2020  . MAMMOGRAM  11/20/2020  . INFLUENZA VACCINE  06/07/2021  . TETANUS/TDAP  01/19/2028  . Hepatitis C Screening  Completed  . HIV Screening  Completed  . HPV VACCINES  Aged Out   Bone Density Test A bone density test uses a type of X-ray to measure the amount of calcium and other minerals in a person's bones. It can measure bone density in the hip and the spine. The test is similar to having a regular X-ray. This test may also be called: Bone densitometry. Bone mineral density test. Dual-energy X-ray absorptiometry (DEXA). You may have this test to: Diagnose a condition that causes weak or thin bones (osteoporosis). Screen you for osteoporosis. Predict your risk for a broken bone (fracture). Determine how well your osteoporosis treatment is working. Tell a health care provider about: Any allergies you have. All medicines you are taking, including vitamins, herbs, eye drops, creams, and over-the-counter medicines. Any problems you or family members have had with anesthetic medicines. Any blood disorders you have. Any surgeries you have had. Any medical conditions you have. Whether you are pregnant or may be pregnant. Any medical tests you have had within the past 14 days that used contrast material. What are the risks? Generally, this is a safe test. However, it does expose you to a small amountof radiation, which can slightly increase your cancer risk. What happens before the test? Do not take any calcium supplements within the 24 hours before your test. You will need to remove all metal jewelry, eyeglasses, removable dental appliances, and any other metal objects on your body. What happens during the test?  You will lie  down on an exam table. There will be  an X-ray generator below you and an imaging device above you. Other devices, such as boxes or braces, may be used to position your body properly for the scan. The machine will slowly scan your body. You will need to keep very still while the machine does the scan. The images will show up on a screen in the room. Images will be examined by a specialist after your test is finished. The procedure may vary among health care providers and hospitals. What can I expect after the test? It is up to you to get the results of your test. Ask your health care provider,or the department that is doing the test, when your results will be ready. Summary A bone density test is an imaging test that uses a type of X-ray to measure the amount of calcium and other minerals in your bones. The test may be used to diagnose or screen you for a condition that causes weak or thin bones (osteoporosis), predict your risk for a broken bone (fracture), or determine how well your osteoporosis treatment is working. Do not take any calcium supplements within 24 hours before your test. Ask your health care provider, or the department that is doing the test, when your results will be ready. This information is not intended to replace advice given to you by your health care provider. Make sure you discuss any questions you have with your healthcare provider. Document Revised: 04/09/2020 Document Reviewed: 04/09/2020 Elsevier Patient Education  2022 [place] Prevention in the Home, Adult Falls can cause injuries and can happen to people of all ages. There are many things you can do to make your home safe and to help prevent falls. Ask forhelp when making these changes. What actions can I take to prevent falls? General Instructions Use good lighting in all rooms. Replace any light bulbs that burn out. Turn on the lights in dark areas. Use night-lights. Keep items that you use often in easy-to-reach places. Lower the  shelves around your home if needed. Set up your furniture so you have a clear path. Avoid moving your furniture around. Do not have throw rugs or other things on the floor that can make you trip. Avoid walking on wet floors. If any of your floors are uneven, fix them. Add color or contrast paint or tape to clearly mark and help you see: Grab bars or handrails. First and last steps of staircases. Where the edge of each step is. If you use a stepladder: Make sure that it is fully opened. Do not climb a closed stepladder. Make sure the sides of the stepladder are locked in place. Ask someone to hold the stepladder while you use it. Know where your pets are when moving through your home. What can I do in the bathroom?     Keep the floor dry. Clean up any water on the floor right away. Remove soap buildup in the tub or shower. Use nonskid mats or decals on the floor of the tub or shower. Attach bath mats securely with double-sided, nonslip rug tape. If you need to sit down in the shower, use a plastic, nonslip stool. Install grab bars by the toilet and in the tub and shower. Do not use towel bars as grab bars. What can I do in the bedroom? Make sure that you have a light by your bed that is easy to reach. Do not use any sheets or blankets for your  bed that hang to the floor. Have a firm chair with side arms that you can use for support when you get dressed. What can I do in the kitchen? Clean up any spills right away. If you need to reach something above you, use a step stool with a grab bar. Keep electrical cords out of the way. Do not use floor polish or wax that makes floors slippery. What can I do with my stairs? Do not leave any items on the stairs. Make sure that you have a light switch at the top and the bottom of the stairs. Make sure that there are handrails on both sides of the stairs. Fix handrails that are broken or loose. Install nonslip stair treads on all your  stairs. Avoid having throw rugs at the top or bottom of the stairs. Choose a carpet that does not hide the edge of the steps on the stairs. Check carpeting to make sure that it is firmly attached to the stairs. Fix carpet that is loose or worn. What can I do on the outside of my home? Use bright outdoor lighting. Fix the edges of walkways and driveways and fix any cracks. Remove anything that might make you trip as you walk through a door, such as a raised step or threshold. Trim any bushes or trees on paths to your home. Check to see if handrails are loose or broken and that both sides of all steps have handrails. Install guardrails along the edges of any raised decks and porches. Clear paths of anything that can make you trip, such as tools or rocks. Have leaves, snow, or ice cleared regularly. Use sand or salt on paths during winter. Clean up any spills in your garage right away. This includes grease or oil spills. What other actions can I take? Wear shoes that: Have a low heel. Do not wear high heels. Have rubber bottoms. Feel good on your feet and fit well. Are closed at the toe. Do not wear open-toe sandals. Use tools that help you move around if needed. These include: Canes. Walkers. Scooters. Crutches. Review your medicines with your doctor. Some medicines can make you feel dizzy. This can increase your chance of falling. Ask your doctor what else you can do to help prevent falls. Where to find more information Centers for Disease Control and Prevention, [employer]: http://www.wolf.info/ National Institute on Aging: http://kim-miller.com/ Contact a doctor if: You are afraid of falling at home. You feel weak, drowsy, or dizzy at home. You fall at home. Summary There are many simple things that you can do to make your home safe and to help prevent falls. Ways to make your home safe include removing things that can make you trip and installing grab bars in the bathroom. Ask for help when making  these changes in your home. This information is not intended to replace advice given to you by your health care provider. Make sure you discuss any questions you have with your healthcare provider. Document Revised: 05/27/2020 Document Reviewed: 05/27/2020 Elsevier Patient Education  [place] Maintenance, Female Adopting a healthy lifestyle and getting preventive care are important in promoting health and wellness. Ask your health care provider about: The right schedule for you to have regular tests and exams. Things you can do on your own to prevent diseases and keep yourself healthy. What should I know about diet, weight, and exercise? Eat a healthy diet  Eat a diet that includes plenty of vegetables, fruits, low-fat dairy  products, and lean protein. Do not eat a lot of foods that are high in solid fats, added sugars, or sodium.  Maintain a healthy weight Body mass index (BMI) is used to identify weight problems. It estimates body fat based on height and weight. Your health care provider can help determineyour BMI and help you achieve or maintain a healthy weight. Get regular exercise Get regular exercise. This is one of the most important things you can do for your health. Most adults should: Exercise for at least 150 minutes each week. The exercise should increase your heart rate and make you sweat (moderate-intensity exercise). Do strengthening exercises at least twice a week. This is in addition to the moderate-intensity exercise. Spend less time sitting. Even light physical activity can be beneficial. Watch cholesterol and blood lipids Have your blood tested for lipids and cholesterol at 66 years of age, then havethis test every 5 years. Have your cholesterol levels checked more often if: Your lipid or cholesterol levels are high. You are older than 66 years of age. You are at high risk for heart disease. What should I know about cancer screening? Depending on  your health history and family history, you may need to have cancer screening at various ages. This may include screening for: Breast cancer. Cervical cancer. Colorectal cancer. Skin cancer. Lung cancer. What should I know about heart disease, diabetes, and high blood pressure? Blood pressure and heart disease High blood pressure causes heart disease and increases the risk of stroke. This is more likely to develop in people who have high blood pressure readings, are of African descent, or are overweight. Have your blood pressure checked: Every 3-5 years if you are 73-102 years of age. Every year if you are 22 years old or older. Diabetes Have regular diabetes screenings. This checks your fasting blood sugar level. Have the screening done: Once every three years after age 23 if you are at a normal weight and have a low risk for diabetes. More often and at a younger age if you are overweight or have a high risk for diabetes. What should I know about preventing infection? Hepatitis B If you have a higher risk for hepatitis B, you should be screened for this virus. Talk with your health care provider to find out if you are at risk forhepatitis B infection. Hepatitis C Testing is recommended for: Everyone born from [birth year] through 1966. Anyone with known risk factors for hepatitis C. Sexually transmitted infections (STIs) Get screened for STIs, including gonorrhea and chlamydia, if: You are sexually active and are younger than 66 years of age. You are older than 66 years of age and your health care provider tells you that you are at risk for this type of infection. Your sexual activity has changed since you were last screened, and you are at increased risk for chlamydia or gonorrhea. Ask your health care provider if you are at risk. Ask your health care provider about whether you are at high risk for HIV. Your health care provider may recommend a prescription medicine to help prevent HIV  infection. If you choose to take medicine to prevent HIV, you should first get tested for HIV. You should then be tested every 3 months for as long as you are taking the medicine. Pregnancy If you are about to stop having your period (premenopausal) and you may become pregnant, seek counseling before you get pregnant. Take 400 to 800 micrograms (mcg) of folic acid every day if you become pregnant.  Ask for birth control (contraception) if you want to prevent pregnancy. Osteoporosis and menopause Osteoporosis is a disease in which the bones lose minerals and strength with aging. This can result in bone fractures. If you are 11 years old or older, or if you are at risk for osteoporosis and fractures, ask your health care provider if you should: Be screened for bone loss. Take a calcium or vitamin D supplement to lower your risk of fractures. Be given hormone replacement therapy (HRT) to treat symptoms of menopause. Follow these instructions at home: Lifestyle Do not use any products that contain nicotine or tobacco, such as cigarettes, e-cigarettes, and chewing tobacco. If you need help quitting, ask your health care provider. Do not use street drugs. Do not share needles. Ask your health care provider for help if you need support or information about quitting drugs. Alcohol use Do not drink alcohol if: Your health care provider tells you not to drink. You are pregnant, may be pregnant, or are planning to become pregnant. If you drink alcohol: Limit how much you use to 0-1 drink a day. Limit intake if you are breastfeeding. Be aware of how much alcohol is in your drink. In the [place] one drink equals one 12 oz bottle of beer (355 mL), one 5 oz glass of wine (148 mL), or one 1 oz glass of hard liquor (44 mL). General instructions Schedule regular health, dental, and eye exams. Stay current with your vaccines. Tell your health care provider if: You often feel depressed. You have ever been  abused or do not feel safe at home. Summary Adopting a healthy lifestyle and getting preventive care are important in promoting health and wellness. Follow your health care provider's instructions about healthy diet, exercising, and getting tested or screened for diseases. Follow your health care provider's instructions on monitoring your cholesterol and blood pressure. This information is not intended to replace advice given to you by your health care provider. Make sure you discuss any questions you have with your healthcare provider. Document Revised: 10/17/2018 Document Reviewed: 10/17/2018 Elsevier Patient Education  2022 [employer].

## 2021-07-16 ENCOUNTER — Ambulatory Visit: Payer: Medicare HMO

## 2021-07-20 NOTE — Progress Notes (Signed)
I discussed the AWV findings with the provider who conducted the visit. I was present in the office suite and immediately available to provide assistance and direction throughout the time the service was provided.  ?

## 2021-07-23 ENCOUNTER — Other Ambulatory Visit: Payer: Self-pay | Admitting: Physician Assistant

## 2021-07-23 NOTE — Telephone Encounter (Signed)
Please send

## 2021-07-24 ENCOUNTER — Encounter (HOSPITAL_COMMUNITY): Payer: Self-pay

## 2021-07-24 ENCOUNTER — Other Ambulatory Visit: Payer: Self-pay

## 2021-07-24 ENCOUNTER — Ambulatory Visit (HOSPITAL_COMMUNITY)
Admission: EM | Admit: 2021-07-24 | Discharge: 2021-07-24 | Disposition: A | Discharge status: Home / Self Care | Payer: Medicare HMO | Attending: Physician Assistant | Admitting: Physician Assistant

## 2021-07-24 DIAGNOSIS — M79672 Pain in left foot: Secondary | ICD-10-CM | POA: Diagnosis not present

## 2021-07-24 DIAGNOSIS — M79671 Pain in right foot: Secondary | ICD-10-CM | POA: Diagnosis not present

## 2021-07-24 MED ORDER — PREDNISONE 10 MG PO TABS: 20.0000 mg | ORAL_TABLET | Freq: Every day | ORAL | 0 refills | Status: AC

## 2021-07-24 NOTE — ED Triage Notes (Signed)
Pt presents with gout in both feet X 1 month .

## 2021-07-24 NOTE — Discharge Instructions (Signed)
Take prednisone as prescribed. Follow up with primary care provider if symptoms fail to improve or if symptoms worsen in any way.

## 2021-07-24 NOTE — ED Provider Notes (Signed)
[facility]    CSN: [id number] Arrival date & time: 07/24/21  1314      History   Chief Complaint Chief Complaint  Patient presents with   Gout     HPI [name] [name] is a 65 y.o. female.   Patient here today for evaluation of bilateral foot pain.  She reports that she has self diagnosed herself with gout.  She does not report prior history of gout.  She states is the only thing she could imagine could possibly be wrong with her feet.  She reports that pain is worse in her left foot.  She has also noticed more swelling in her left foot.  She has not had any fever.  She denies any worsening shortness of breath than she typically has at baseline.  Oxygen saturation is somewhat low today, but patient has not wearing her oxygen as she typically would.  She does not report any additional treatment for symptoms.    Past Medical History:  Diagnosis Date   Anxiety    Asthma    Chronic, with restrictive component 2/2 obesity   Depression    GERD (gastroesophageal reflux disease)    Headache    Insomnia    Obesity    On home oxygen therapy    "3L at night only when I get sick" (04/30/2014)   Shortness of breath dyspnea    Sleep apnea    "couldn't afford mask so I didn't get it" (04/30/2014)    Patient Active Problem List   Diagnosis Date Noted   Pneumonia due to COVID-19 virus 12/02/2020   COVID-19 with pulmonary comorbidity 12/02/2020   Thrombocytopenia due to COVID-19 virus 12/02/2020   Leukopenia 12/02/2020   Cellulitis of finger of left hand 08/08/2020   Acute on chronic respiratory failure with hypoxemia ([place]) 06/30/2020   Fall from ground level 06/16/2020   Shortness of breath 06/16/2020   Hypoxia, sleep related 06/13/2019   Posterior right knee pain 02/06/2019   Disequilibrium 02/06/2019   OCD (obsessive compulsive disorder) 08/13/2018   Aortic atherosclerosis ([place]) 08/17/2016   Rash and nonspecific skin eruption 12/17/2015   Periodic limb movement  disorder 08/14/2015   Obesity hypoventilation syndrome ([place]) 01/21/2015   Health care maintenance 07/01/2014   Asthma 05/28/2013   OSA (obstructive sleep apnea) 04/04/2013   Anxiety disorder 07/02/2012   Depression 06/28/2012   Morbid obesity ([place]) 03/02/2012   GERD (gastroesophageal reflux disease) 02/05/2012    Past Surgical History:  Procedure Laterality Date   APPENDECTOMY  ~ 2002   VAGINAL HYSTERECTOMY  ~ 1978    OB History   No obstetric history on file.      Home Medications    Prior to Admission medications   Medication Sig Start Date End Date Taking? Authorizing Provider  predniSONE (DELTASONE) 10 MG tablet Take 2 tablets (20 mg total) by mouth daily for 5 days. 07/24/21 07/29/21 Yes Francene Finders, PA-C  acetaminophen (TYLENOL) 500 MG tablet Take 1,000 mg by mouth every 6 (six) hours as needed (for pain OR back pain).     [provider]  albuterol (VENTOLIN HFA) 108 (90 Base) MCG/ACT inhaler Inhale 2 puffs into the lungs every 6 (six) hours as needed for wheezing or shortness of breath. 06/16/20   Andrew Au, MD  ALPRAZolam Duanne Moron) 1 MG tablet Take 0.5-1 tablets (0.5-1 mg total) by mouth 2 (two) times daily as needed. for anxiety 11/18/20   Donnal Moat T, PA-C  ALPRAZolam Duanne Moron) 1  MG tablet Take 1 mg by mouth at bedtime.    [provider]  ALPRAZolam (XANAX) 1 MG tablet TAKE 1/2 TO 1 TABLET(0.5 TO 1 MG) BY MOUTH TWICE DAILY AS NEEDED FOR ANXIETY 12/28/20   Donnal Moat T, PA-C  atorvastatin (LIPITOR) 10 MG tablet Take 1 tablet (10 mg total) by mouth daily. 05/31/21   Iona Beard, MD  celecoxib (CELEBREX) 100 MG capsule Take 100 mg by mouth 2 (two) times daily as needed. 06/01/21   [provider]  famotidine (PEPCID) 10 MG tablet Take 1 tablet (10 mg total) by mouth 2 (two) times daily. May increase to 2 pills twice a day if no improvement after 2 weeks Patient not taking: Reported on 06/29/2021 06/13/19   Bartholomew Crews, MD   fluticasone furoate-vilanterol (BREO ELLIPTA) 100-25 MCG/INH AEPB Inhale 1 puff into the lungs daily. 06/16/20   Andrew Au, MD  methocarbamol (ROBAXIN) 500 MG tablet Take 1 tablet (500 mg total) by mouth every 8 (eight) hours as needed for muscle spasms. 06/16/20   Andrew Au, MD  pantoprazole (PROTONIX) 20 MG tablet TAKE 1 TABLET(20 MG) BY MOUTH DAILY 12/14/20   Andrew Au, MD  sertraline (ZOLOFT) 100 MG tablet TAKE 3 TABLETS(300 MG) BY MOUTH DAILY 05/24/21   Donnal Moat T, PA-C  traZODone (DESYREL) 150 MG tablet TAKE 1 TABLET(150 MG) BY MOUTH AT BEDTIME AS NEEDED FOR SLEEP 05/24/21   Addison Lank, PA-C    Family History Family History  Problem Relation Age of Onset   Breast cancer Mother    Alcohol abuse Mother    Emphysema Mother        smoked   Asthma Mother    Breast cancer Maternal Aunt    Breast cancer Maternal Grandmother     Social History Social History   Tobacco Use   Smoking status: Former    Packs/day: 0.00    Years: 44.00    Pack years: 0.00    Types: Cigarettes    Quit date: 11/07/2012    Years since quitting: 8.7   Smokeless tobacco: Never  Substance Use Topics   Alcohol use: No    Alcohol/week: 0.0 standard drinks   Drug use: No     Allergies   Ibuprofen and Clonazepam   Review of Systems Review of Systems  Constitutional:  Negative for chills and fever.  Eyes:  Negative for discharge and redness.  Musculoskeletal:  Positive for arthralgias.    Physical Exam Triage Vital Signs ED Triage Vitals  Enc Vitals Group     BP 07/24/21 1336 116/84     Pulse Rate 07/24/21 1336 85     Resp 07/24/21 1336 17     Temp 07/24/21 1336 98.2 F (36.8 C)     Temp Source 07/24/21 1336 Oral     SpO2 07/24/21 1336 (!) 89 %     Weight --      Height --      Head Circumference --      Peak Flow --      Pain Score 07/24/21 1335 9     Pain Loc --      Pain Edu? --      Excl. in [place]? --    No data found.  Updated Vital Signs BP 116/84 (BP  Location: Left Arm)   Pulse 85   Temp 98.2 F (36.8 C) (Oral)   Resp 17   SpO2 (!) 89% Comment: has COPD, on continuous oxygen  at home when needed     Physical Exam Vitals and nursing note reviewed.  Constitutional:      General: She is not in acute distress.    Appearance: Normal appearance. She is not ill-appearing.  HENT:     Head: Normocephalic and atraumatic.     Nose: Nose normal.  Eyes:     Conjunctiva/sclera: Conjunctivae normal.  Cardiovascular:     Rate and Rhythm: Normal rate.  Pulmonary:     Effort: Pulmonary effort is normal. No respiratory distress.  Musculoskeletal:     Comments: Mild swelling appreciated to left distal foot.  Neurological:     Mental Status: She is alert.  Psychiatric:        Mood and Affect: Mood normal.        Behavior: Behavior normal.     UC Treatments / Results  Labs (all labs ordered are listed, but only abnormal results are displayed) Labs Reviewed - No data to display  EKG   Radiology No results found.  Procedures Procedures (including critical care time)  Medications Ordered in UC Medications - No data to display  Initial Impression / Assessment and Plan / UC Course  I have reviewed the triage vital signs and the nursing notes.  Pertinent labs & imaging results that were available during my care of the patient were reviewed by me and considered in my medical decision making (see chart for details).  Discussed that presentation is not classic for gout, but discussed she would need further work-up for same.  Will treat with steroid burst, and recommended follow-up with her primary care provider.  Final Clinical Impressions(s) / UC Diagnoses   Final diagnoses:  Foot pain, bilateral     Discharge Instructions      Take prednisone as prescribed. Follow up with primary care provider if symptoms fail to improve or if symptoms worsen in any way.      ED Prescriptions     Medication Sig Dispense Auth. Provider    predniSONE (DELTASONE) 10 MG tablet Take 2 tablets (20 mg total) by mouth daily for 5 days. 10 tablet Francene Finders, PA-C      PDMP not reviewed this encounter.   Francene Finders, PA-C 07/24/21 1454

## 2021-08-02 DIAGNOSIS — J9610 Chronic respiratory failure, unspecified whether with hypoxia or hypercapnia: Secondary | ICD-10-CM | POA: Diagnosis not present

## 2021-08-05 ENCOUNTER — Telehealth: Payer: Self-pay

## 2021-08-05 NOTE — Telephone Encounter (Signed)
I LVM for patient to make an appointment with her PCP or next available to be seen for about her oxygen.We have received paperwork from [place] about this [place], [place] C9/29/20223:05 PM

## 2021-08-06 ENCOUNTER — Telehealth (INDEPENDENT_AMBULATORY_CARE_PROVIDER_SITE_OTHER): Payer: Medicare HMO | Admitting: Physician Assistant

## 2021-08-06 ENCOUNTER — Encounter: Payer: Self-pay | Admitting: Physician Assistant

## 2021-08-06 DIAGNOSIS — F411 Generalized anxiety disorder: Secondary | ICD-10-CM | POA: Diagnosis not present

## 2021-08-06 DIAGNOSIS — G4700 Insomnia, unspecified: Secondary | ICD-10-CM | POA: Diagnosis not present

## 2021-08-06 DIAGNOSIS — F3342 Major depressive disorder, recurrent, in full remission: Secondary | ICD-10-CM | POA: Diagnosis not present

## 2021-08-06 DIAGNOSIS — Z6379 Other stressful life events affecting family and household: Secondary | ICD-10-CM

## 2021-08-06 MED ORDER — ALPRAZOLAM 1 MG PO TABS: ORAL_TABLET | ORAL | 5 refills | Status: DC

## 2021-08-06 NOTE — Progress Notes (Addendum)
[facility] Med Check  Patient ID: [name] [name],  MRN: [id number]  PCP: Iona Beard, MD  Date of Evaluation: 08/06/2021  Time spent:25 minutes  Chief Complaint:  Chief Complaint   Anxiety; Depression; Insomnia; Follow-up    Virtual Visit via Telehealth  I connected with patient by telephone, with their informed consent, and verified patient privacy and that I am speaking with the correct person using two identifiers.  I am private, in my office and the patient is at home.  I discussed the limitations, risks, security and privacy concerns of performing an evaluation and management service by telephone and the availability of in person appointments. I also discussed with the patient that there may be a patient responsible charge related to this service. The patient expressed understanding and agreed to proceed.   I discussed the assessment and treatment plan with the patient. The patient was provided an opportunity to ask questions and all were answered. The patient agreed with the plan and demonstrated an understanding of the instructions.   The patient was advised to call back or seek an in-person evaluation if the symptoms worsen or if the condition fails to improve as anticipated.  I provided 25 minutes of non-face-to-face time during this encounter.   HISTORY/CURRENT STATUS: HPI For routine med check.   Husband fell in April. Broke femur, several other bones, broke his wrist. Has had several surgeries, has been in a nursing home, back in hospital several times.  He is now at home but to PT 2 days a week.  It has been very stressful on her.  Says she is not cut out to be a caregiver.  Other than that she feels like things are going well.  Patient denies loss of interest in usual activities and is able to enjoy things.  Denies decreased energy or motivation.  Appetite has not changed.  No extreme sadness, tearfulness, or feelings of hopelessness.  Denies any changes in  concentration, making decisions or remembering things.  Sleeps well.  Anxiety is well controlled with the Xanax.  She has not been experiencing panic attacks as much as just generalized anxiety, but a little worse than normal because of her husband's injuries.  Denies suicidal or homicidal thoughts.  Patient denies increased energy with decreased need for sleep, no increased talkativeness, no racing thoughts, no impulsivity or risky behaviors, no increased spending, no increased libido, no grandiosity, no paranoia, no hallucinations.  Denies dizziness, syncope, seizures, numbness, tingling, tremor, tics, unsteady gait, slurred speech, confusion.   Individual Medical History/ Review of Systems: Changes? :Yes   problems with her foot.  Is seeing a podiatrist soon.  Past medications for mental health diagnoses include: Wellbutrin, Xanax, Zoloft, trazodone, gabapentin, BuSpar, Ativan, Klonopin  Allergies: Ibuprofen and Clonazepam  Current Medications:  Current Outpatient Medications:    acetaminophen (TYLENOL) 500 MG tablet, Take 1,000 mg by mouth every 6 (six) hours as needed (for pain OR back pain). , Disp: , Rfl:    albuterol (VENTOLIN HFA) 108 (90 Base) MCG/ACT inhaler, Inhale 2 puffs into the lungs every 6 (six) hours as needed for wheezing or shortness of breath., Disp: 8 g, Rfl: 5   ALPRAZolam (XANAX) 1 MG tablet, Take 0.5-1 tablets (0.5-1 mg total) by mouth 2 (two) times daily as needed. for anxiety, Disp: 60 tablet, Rfl: 5   ALPRAZolam (XANAX) 1 MG tablet, Take 1 mg by mouth at bedtime., Disp: , Rfl:    atorvastatin (LIPITOR) 10 MG tablet, Take 1 tablet (10 mg  total) by mouth daily., Disp: 90 tablet, Rfl: 3   fluticasone furoate-vilanterol (BREO ELLIPTA) 100-25 MCG/INH AEPB, Inhale 1 puff into the lungs daily., Disp: 28 each, Rfl: 5   methocarbamol (ROBAXIN) 500 MG tablet, Take 1 tablet (500 mg total) by mouth every 8 (eight) hours as needed for muscle spasms., Disp: 30 tablet, Rfl: 0    pantoprazole (PROTONIX) 20 MG tablet, TAKE 1 TABLET(20 MG) BY MOUTH DAILY, Disp: 90 tablet, Rfl: 1   sertraline (ZOLOFT) 100 MG tablet, TAKE 3 TABLETS(300 MG) BY MOUTH DAILY, Disp: 270 tablet, Rfl: 3   traZODone (DESYREL) 150 MG tablet, TAKE 1 TABLET(150 MG) BY MOUTH AT BEDTIME AS NEEDED FOR SLEEP, Disp: 90 tablet, Rfl: 3   ALPRAZolam (XANAX) 1 MG tablet, TAKE 1/2 TO 1 TABLET(0.5 TO 1 MG) BY MOUTH TWICE DAILY AS NEEDED FOR ANXIETY, Disp: 60 tablet, Rfl: 5   celecoxib (CELEBREX) 100 MG capsule, Take 100 mg by mouth 2 (two) times daily as needed. (Patient not taking: Reported on 08/06/2021), Disp: , Rfl:    famotidine (PEPCID) 10 MG tablet, Take 1 tablet (10 mg total) by mouth 2 (two) times daily. May increase to 2 pills twice a day if no improvement after 2 weeks (Patient not taking: No sig reported), Disp: 60 tablet, Rfl: 3 Medication Side Effects: none  Family Medical/ Social History: Changes? No  MENTAL HEALTH EXAM:  There were no vitals taken for this visit.There is no height or weight on file to calculate BMI.  General Appearance:  Unable to assess  Eye Contact:   Unable to assess  Speech:  Clear and Coherent and Normal Rate  Volume:  Normal  Mood:  Euthymic  Affect:   Unable to assess  Thought Process:  Goal Directed and Descriptions of Associations: Intact  Orientation:  Full (Time, Place, and Person)  Thought Content: Logical   Suicidal Thoughts:  No  Homicidal Thoughts:  No  Memory:  WNL  Judgement:  Good  Insight:  Good  Psychomotor Activity:   unable to assess  Concentration:  Concentration: Good and Attention Span: Good  Recall:  Good  Fund of Knowledge: Good  Language: Good  Assets:  Desire for Improvement  ADL's:  Intact  Cognition: WNL  Prognosis:  Good   Most recent labs: 12/05/2020 BMP nl  DIAGNOSES:    ICD-10-CM   1. Generalized anxiety disorder  F41.1     2. Recurrent major depressive disorder, in full remission ([place])  F33.42     3. Stress due to illness  of family member  Z63.79     43. Insomnia, unspecified type  G47.00        Receiving Psychotherapy: No    RECOMMENDATIONS:  PDMP was Unable to review. I provided 25 minutes of non-face-to-face time during this encounter, including time spent before and after the visit in records review, medical decision making, and charting.  She is doing well as far as her medications are concerned therefore there will be no medication changes. Continue Xanax 1 mg, 1/2-1 twice daily as needed. Continue Zoloft 100 mg, 3 p.o. daily. Continue trazodone 150 mg, 1 p.o. nightly as needed sleep. Return in 6 months.  Donnal Moat, PA-C

## 2021-08-10 ENCOUNTER — Encounter: Payer: Medicare HMO | Admitting: Internal Medicine

## 2021-08-12 ENCOUNTER — Other Ambulatory Visit: Payer: Self-pay

## 2021-08-12 ENCOUNTER — Ambulatory Visit (INDEPENDENT_AMBULATORY_CARE_PROVIDER_SITE_OTHER): Payer: Medicare HMO

## 2021-08-12 ENCOUNTER — Ambulatory Visit: Payer: Medicare HMO | Admitting: Podiatry

## 2021-08-12 DIAGNOSIS — M779 Enthesopathy, unspecified: Secondary | ICD-10-CM

## 2021-08-12 DIAGNOSIS — M79672 Pain in left foot: Secondary | ICD-10-CM

## 2021-08-12 DIAGNOSIS — G629 Polyneuropathy, unspecified: Secondary | ICD-10-CM | POA: Diagnosis not present

## 2021-08-12 DIAGNOSIS — M79671 Pain in right foot: Secondary | ICD-10-CM

## 2021-08-12 MED ORDER — GABAPENTIN 100 MG PO CAPS: 100.0000 mg | ORAL_CAPSULE | Freq: Three times a day (TID) | ORAL | 3 refills | Status: DC

## 2021-08-12 NOTE — Patient Instructions (Signed)
Gabapentin Capsules or Tablets What is this medication? GABAPENTIN (GA ba pen tin) treats nerve pain. It may also be used to prevent and control seizures in people with epilepsy. It works by calming overactivenerves in your body. This medicine may be used for other purposes; ask your health care provider orpharmacist if you have questions. COMMON BRAND NAME(S): Active-PAC with Gabapentin, Gabarone, Gralise, Neurontin What should I tell my care team before I take this medication? They need to know if you have any of these conditions: Alcohol or substance use disorder Kidney disease Lung or breathing disease Suicidal thoughts, plans, or attempt; a previous suicide attempt by you or a family member An unusual or allergic reaction to gabapentin, other medications, foods, dyes, or preservatives Pregnant or trying to get pregnant Breast-feeding How should I use this medication? Take this medication by mouth with a glass of water. Follow the directions on the prescription label. You can take it with or without food. If it upsets your stomach, take it with food. Take your medication at regular intervals. Do not take it more often than directed. Do not stop taking except on your care team'sadvice. If you are directed to break the 600 or 800 mg tablets in half as part of your dose, the extra half tablet should be used for the next dose. If you have notused the extra half tablet within 28 days, it should be thrown away. A special MedGuide will be given to you by the pharmacist with eachprescription and refill. Be sure to read this information carefully each time. Talk to your care team about the use of this medication in children. While this medication may be prescribed for children as young as 3 years for selectedconditions, precautions do apply. Overdosage: If you think you have taken too much of this medicine contact apoison control center or emergency room at once. NOTE: This medicine is only for you.  Do not share this medicine with others. What if I miss a dose? If you miss a dose, take it as soon as you can. If it is almost time for yournext dose, take only that dose. Do not take double or extra doses. What may interact with this medication? Alcohol Antihistamines for allergy, cough, and cold Certain medications for anxiety or sleep Certain medications for depression like amitriptyline, fluoxetine, sertraline Certain medications for seizures like phenobarbital, primidone Certain medications for stomach problems General anesthetics like halothane, isoflurane, methoxyflurane, propofol Local anesthetics like lidocaine, pramoxine, tetracaine Medications that relax muscles for surgery Narcotic medications for pain Phenothiazines like chlorpromazine, mesoridazine, prochlorperazine, thioridazine This list may not describe all possible interactions. Give your health care provider a list of all the medicines, herbs, non-prescription drugs, or dietary supplements you use. Also tell them if you smoke, drink alcohol, or use illegaldrugs. Some items may interact with your medicine. What should I watch for while using this medication? Visit your care team for regular checks on your progress. You may want to keep a record at home of how you feel your condition is responding to treatment. You may want to share this information with your care team at each visit. You should contact your care team if your seizures get worse or if you have any new types of seizures. Do not stop taking this medication or any of your seizure medications unless instructed by your care team. Stopping your medicationsuddenly can increase your seizures or their severity. This medication may cause serious skin reactions. They can happen weeks to months after starting the   medication. Contact your care team right away if you notice fevers or flu-like symptoms with a rash. The rash may be red or purple and then turn into blisters or  peeling of the skin. Or, you might notice a red rash with swelling of the face, lips or lymph nodes in your neck or under yourarms. Wear a medical identification bracelet or chain if you are taking thismedication for seizures. Carry a card that lists all your medications. You may get drowsy, dizzy, or have blurred vision. Do not drive, use machinery, or do anything that needs mental alertness until you know how this medication affects you. To reduce dizzy or fainting spells, do not sit or stand up quickly, especially if you are an older patient. Alcohol can increasedrowsiness and dizziness. Your mouth may get dry. Chewing sugarless gum or sucking hard candy, anddrinking plenty of water may help. Watch for new or worsening thoughts of suicide or depression. This includes sudden changes in mood, behaviors, or thoughts. These changes can happen at any time but are more common in the beginning of treatment or after a change in dose. Call your care team right away if you experience these thoughts orworsening depression. If you become pregnant while using this medication, you may enroll in the [place] Antiepileptic Drug Pregnancy Registry by calling 1-888-233-2334. This registry collects information about the safety of antiepileptic medication useduring pregnancy. What side effects may I notice from receiving this medication? Side effects that you should report to your care team as soon as possible: Allergic reactions or angioedema-skin rash, itching, hives, swelling of the face, eyes, lips, tongue, arms, or legs, trouble swallowing or breathing Rash, fever, and swollen lymph nodes Thoughts of suicide or self harm, worsening mood, feelings of depression Trouble breathing Unusual changes in mood or behavior in children after use such as difficulty concentrating, hostility, or restlessness Side effects that usually do not require medical attention (report to your careteam if they continue or are  bothersome): Dizziness Drowsiness Nausea Swelling of ankles, feet, or hands Vomiting This list may not describe all possible side effects. Call your doctor for medical advice about side effects. You may report side effects to FDA at1-800-FDA-1088. Where should I keep my medication? Keep out of reach of children and pets. Store at room temperature between 15 and 30 degrees C (59 and 86 degrees F).Get rid of any unused medication after the expiration date. This medication may cause accidental overdose and death if taken by other adults, children, or pets. Mix any unused medication with a substance like cat litter or coffee grounds. Then throw the medication away in a sealed containerlike a sealed bag or a coffee can with a lid. NOTE: This sheet is a summary. It may not cover all possible information. If you have questions about this medicine, talk to your doctor, pharmacist, orhealth care provider.  2022 Elsevier/Gold Standard (2020-09-09 12:16:18)  

## 2021-08-17 ENCOUNTER — Encounter: Payer: Medicare HMO | Admitting: Internal Medicine

## 2021-08-18 ENCOUNTER — Other Ambulatory Visit: Payer: Self-pay | Admitting: Internal Medicine

## 2021-08-18 NOTE — Progress Notes (Signed)
Subjective:   Patient ID: [name] [name], female   DOB: 65 y.o.   MRN: [id number]   HPI 65 year old female presents the office today with concerns of bilateral foot pain, swelling.  The swelling she noticed previously.  She does get the sensations in the top and bottom of her toes and going to the arch and she describes a tingling sensation.  She describes a tingling, cold sensation and she states that it feels like when the arm falls asleep but on the foot.  Feels like his grip stepping on a gravel sand.  She previously on prednisone for another issue which somewhat helped with the foot.   Review of Systems  All other systems reviewed and are negative.  Past Medical History:  Diagnosis Date   Anxiety    Asthma    Chronic, with restrictive component 2/2 obesity   Depression    GERD (gastroesophageal reflux disease)    Headache    Insomnia    Obesity    On home oxygen therapy    "3L at night only when I get sick" (04/30/2014)   Shortness of breath dyspnea    Sleep apnea    "couldn't afford mask so I didn't get it" (04/30/2014)    Past Surgical History:  Procedure Laterality Date   APPENDECTOMY  ~ 2002   VAGINAL HYSTERECTOMY  ~ 1978     Current Outpatient Medications:    gabapentin (NEURONTIN) 100 MG capsule, Take 1 capsule (100 mg total) by mouth 3 (three) times daily., Disp: 90 capsule, Rfl: 3   acetaminophen (TYLENOL) 500 MG tablet, Take 1,000 mg by mouth every 6 (six) hours as needed (for pain OR back pain). , Disp: , Rfl:    albuterol (VENTOLIN HFA) 108 (90 Base) MCG/ACT inhaler, Inhale 2 puffs into the lungs every 6 (six) hours as needed for wheezing or shortness of breath., Disp: 8 g, Rfl: 5   ALPRAZolam (XANAX) 1 MG tablet, Take 0.5-1 tablets (0.5-1 mg total) by mouth 2 (two) times daily as needed. for anxiety, Disp: 60 tablet, Rfl: 5   ALPRAZolam (XANAX) 1 MG tablet, Take 1 mg by mouth at bedtime., Disp: , Rfl:    ALPRAZolam (XANAX) 1 MG tablet, TAKE 1/2 TO 1  TABLET(0.5 TO 1 MG) BY MOUTH TWICE DAILY AS NEEDED FOR ANXIETY, Disp: 60 tablet, Rfl: 5   atorvastatin (LIPITOR) 10 MG tablet, Take 1 tablet (10 mg total) by mouth daily., Disp: 90 tablet, Rfl: 3   celecoxib (CELEBREX) 100 MG capsule, Take 100 mg by mouth 2 (two) times daily as needed. (Patient not taking: Reported on 08/06/2021), Disp: , Rfl:    famotidine (PEPCID) 10 MG tablet, Take 1 tablet (10 mg total) by mouth 2 (two) times daily. May increase to 2 pills twice a day if no improvement after 2 weeks (Patient not taking: No sig reported), Disp: 60 tablet, Rfl: 3   fluticasone furoate-vilanterol (BREO ELLIPTA) 100-25 MCG/INH AEPB, Inhale 1 puff into the lungs daily., Disp: 28 each, Rfl: 5   methocarbamol (ROBAXIN) 500 MG tablet, Take 1 tablet (500 mg total) by mouth every 8 (eight) hours as needed for muscle spasms., Disp: 30 tablet, Rfl: 0   pantoprazole (PROTONIX) 20 MG tablet, TAKE 1 TABLET(20 MG) BY MOUTH DAILY, Disp: 90 tablet, Rfl: 1   sertraline (ZOLOFT) 100 MG tablet, TAKE 3 TABLETS(300 MG) BY MOUTH DAILY, Disp: 270 tablet, Rfl: 3   traZODone (DESYREL) 150 MG tablet, TAKE 1 TABLET(150 MG) BY MOUTH AT BEDTIME AS  NEEDED FOR SLEEP, Disp: 90 tablet, Rfl: 3  Allergies  Allergen Reactions   Ibuprofen Anaphylaxis, Hives and Swelling    Throat swells   Clonazepam Other (See Comments)    Dizziness           Objective:  Physical Exam  General: AAO x3, NAD  Dermatological: Skin is warm, dry and supple bilateral. There are no open sores, no preulcerative lesions, no rash or signs of infection present.  Vascular: Dorsalis Pedis artery and Posterior Tibial artery pedal pulses are 2/4 bilateral with immedate capillary fill time. There is no pain with calf compression, swelling, warmth, erythema.   Neruologic: Sensation somewhat decreased with Semmes Weinstein monofilament.  Musculoskeletal: There is no area of pinpoint tenderness identified bilaterally.  There is no significant edema there  is no erythema or warmth.  Flexor, extensor tendons appear to be intact.  Muscular strength 5/5 in all groups tested bilateral.  Gait: Unassisted, Nonantalgic.       Assessment:   Bilateral foot pain, concern for neuropathy     Plan:  -Treatment options discussed including all alternatives, risks, and complications -Etiology of symptoms were discussed -X-rays were obtained and reviewed with the patient.  No evidence of acute fracture or stress fracture identified today.  Small heel spurs present. -I do think a lot of her symptoms are more nerve related.  Due to this and refer her to neurology.  We can start gabapentin I discussed side effects of medication.  Trula Slade DPM

## 2021-08-18 NOTE — Progress Notes (Signed)
Internal Medicine Clinic Attending  Case discussed with Dr. Lisabeth Devoid at the time of the visit.  I reviewed the AWV findings.  I agree with the assessment, diagnosis, and plan of care documented in the AWV note.

## 2021-08-23 ENCOUNTER — Ambulatory Visit (INDEPENDENT_AMBULATORY_CARE_PROVIDER_SITE_OTHER): Payer: Medicare HMO | Admitting: Internal Medicine

## 2021-08-23 ENCOUNTER — Ambulatory Visit (HOSPITAL_COMMUNITY)
Admission: RE | Admit: 2021-08-23 | Discharge: 2021-08-23 | Disposition: A | Discharge status: Home / Self Care | Payer: Medicare HMO | Source: Ambulatory Visit | Attending: Internal Medicine | Admitting: Internal Medicine

## 2021-08-23 ENCOUNTER — Telehealth: Payer: Self-pay | Admitting: Physician Assistant

## 2021-08-23 VITALS — BP 134/75 | HR 77 | Wt 284.3 lb

## 2021-08-23 DIAGNOSIS — Z0000 Encounter for general adult medical examination without abnormal findings: Secondary | ICD-10-CM

## 2021-08-23 DIAGNOSIS — G4733 Obstructive sleep apnea (adult) (pediatric): Secondary | ICD-10-CM | POA: Diagnosis not present

## 2021-08-23 DIAGNOSIS — F339 Major depressive disorder, recurrent, unspecified: Secondary | ICD-10-CM

## 2021-08-23 DIAGNOSIS — F3340 Major depressive disorder, recurrent, in remission, unspecified: Secondary | ICD-10-CM | POA: Insufficient documentation

## 2021-08-23 DIAGNOSIS — J9621 Acute and chronic respiratory failure with hypoxia: Secondary | ICD-10-CM | POA: Diagnosis not present

## 2021-08-23 MED ORDER — TRAZODONE HCL 150 MG PO TABS: 225.0000 mg | ORAL_TABLET | Freq: Every evening | ORAL | 1 refills | Status: DC | PRN

## 2021-08-23 MED ORDER — ALPRAZOLAM 1 MG PO TABS: 0.5000 mg | ORAL_TABLET | Freq: Three times a day (TID) | ORAL | 2 refills | Status: DC | PRN
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
## 2021-08-23 NOTE — Assessment & Plan Note (Signed)
DEXA scan ordered Deferred colon cancer screening at this time

## 2021-08-23 NOTE — Progress Notes (Signed)
   CC: oxygen requirements  HPI:  [name] [name] is a 65 y.o. female with PMHx as stated below presenting for evaluation of oxygen requirements in setting of chronic hypoxic respiratory failure 2/2 multifactorial etiologies. Patient also significantly depressed and tearful during evaluation. Please see problem based charting for complete assessment and plan.  Past Medical History:  Diagnosis Date   Anxiety    Asthma    Chronic, with restrictive component 2/2 obesity   Depression    GERD (gastroesophageal reflux disease)    Headache    Insomnia    Obesity    On home oxygen therapy    "3L at night only when I get sick" (04/30/2014)   Shortness of breath dyspnea    Sleep apnea    "couldn't afford mask so I didn't get it" (04/30/2014)   Review of Systems:  negative except as stated in HPI.  Physical Exam:  Vitals:   08/23/21 1322  Weight: 284 lb 4.8 oz (129 kg)   Physical Exam  Constitutional: Elderly obese female; No distress.  HENT: Normocephalic and atraumatic Cardiovascular: Normal rate, regular rhythm, S1 and S2 present, no murmurs, rubs, gallops.  Distal pulses intact Respiratory: No respiratory distress,  Lungs are clear to auscultation bilaterally. GI: Distended, soft, nontender to palpation, normal bowel sounds Musculoskeletal: Normal bulk and tone.  No peripheral edema noted. Neurological: Is alert and oriented x4, no apparent focal deficits noted. Skin: Warm and dry.  No rash, erythema, lesions noted. Psychiatric: Tearful and depressed mood. Judgment and thought content normal.    Assessment & Plan:   See Encounters Tab for problem based charting.  Patient discussed with Dr. Philipp Ovens

## 2021-08-23 NOTE — Progress Notes (Signed)
SATURATION QUALIFICATIONS: (This note is used to comply with regulatory documentation for home oxygen)  Patient Saturations on Room Air at Rest = 92%  Patient Saturations on Room Air while Ambulating = 86%  Patient Saturations on 3 Liters of oxygen while Ambulating = 88-90%  Please briefly explain why patient needs home oxygen: see office notes

## 2021-08-23 NOTE — Patient Instructions (Addendum)
[name] [name],  It was a pleasure seeing you in clinic. Today we discussed:   Oxygen requirements: We will fill out paperwork for continued oxygen at home. As we discussed, I recommend getting the sleep study at your earliest convenience and the lung function testing.   Depression: Continue to take all medications as prescribed. I have reached out to your psychiatrist and they will give you a call to discuss further recommendations. As always, please don't hesitate to contact our office.    If you have any questions or concerns, please call our clinic at [phone] between 9am-5pm and after hours call [phone] and ask for the internal medicine resident on call. If you feel you are having a medical emergency please call 911.   Thank you, we look forward to helping you remain healthy!

## 2021-08-23 NOTE — Assessment & Plan Note (Signed)
Patient has a history of chronic mild OSA per sleep study in 2016. She has been hesitant to use a CPAP machine in the past. Patient had been encouraged for repeat sleep study multiple times in the past; however, has refused to do so. Today, patient reports that she is agreeable for sleep study if covered by her insurance.   Plan: Referral for sleep study

## 2021-08-23 NOTE — Assessment & Plan Note (Signed)
[name] [name] is presenting for evaluation for home supplemental oxygen. She has a history of chronic hypoxia and uses supplemental oxygen prn (up to 3L O2) at home. She reports shortness of breath with activity requiring rest. She has a pulse oximeter at home and reports oxygen levels as low as 70's when she lies down.  Suspect her chronic hypoxic respiratory failure is multifactorial in etiology - OHS, OSA, recent history of COVID pneumonia.  In office O2 sats: Patient Saturations on Room Air at Rest = 92% Patient Saturations on Room Air while Ambulating = 86% Patient Saturations on 3 Liters of oxygen while Ambulating = 88-90%  Patient would benefit from formal evaluation for OSA with sleep study and OHS vs COPD with pulmonary function tests. These have been ordered in the past but patient has not been able to follow up. Patient encouraged to follow up with these for which she is agreeable.  Plan:  DME oxygen - Sleep study  - F/u PFT's

## 2021-08-23 NOTE — Telephone Encounter (Signed)
Phone call was taken by Dory Larsen, LPN earlier today from [name] [name]'s PCP, Dr. Lisabeth Devoid.  [name] [name] was very and very depressed although not suicidal.  She has been under a lot of stress with her husband's illness as the sole caregiver.  Patient had a visit 1 to 2 weeks ago and did mention the stress at that point but did not seem to be depressed.  [name] [name] contacted patient later on today who told her she is not sleeping well and is more anxious than anything else.  I had already discussed this with [name] [name], recommending an increase in Xanax and possibly trazodone if needed, I will go ahead and send those prescriptions in.  [name] [name] will follow up as needed and she knows to go to [facility] if she does get suicidal.

## 2021-08-30 NOTE — Progress Notes (Signed)
Internal Medicine Clinic Attending ° °Case discussed with Dr. Aslam  At the time of the visit.  We reviewed the resident’s history and exam and pertinent patient test results.  I agree with the assessment, diagnosis, and plan of care documented in the resident’s note.  °

## 2021-08-31 ENCOUNTER — Other Ambulatory Visit: Payer: Self-pay | Admitting: Podiatry

## 2021-08-31 DIAGNOSIS — M79672 Pain in left foot: Secondary | ICD-10-CM

## 2021-08-31 DIAGNOSIS — M79671 Pain in right foot: Secondary | ICD-10-CM

## 2021-09-01 DIAGNOSIS — J9610 Chronic respiratory failure, unspecified whether with hypoxia or hypercapnia: Secondary | ICD-10-CM | POA: Diagnosis not present

## 2021-09-21 ENCOUNTER — Other Ambulatory Visit: Payer: Self-pay | Admitting: Internal Medicine

## 2021-09-21 DIAGNOSIS — Z13820 Encounter for screening for osteoporosis: Secondary | ICD-10-CM

## 2021-09-22 DIAGNOSIS — M1712 Unilateral primary osteoarthritis, left knee: Secondary | ICD-10-CM | POA: Diagnosis not present

## 2021-09-23 ENCOUNTER — Ambulatory Visit
Admission: RE | Admit: 2021-09-23 | Discharge: 2021-09-23 | Disposition: A | Discharge status: Home / Self Care | Payer: Medicare HMO | Source: Ambulatory Visit | Attending: Internal Medicine | Admitting: Internal Medicine

## 2021-09-23 ENCOUNTER — Other Ambulatory Visit: Payer: Self-pay

## 2021-09-23 DIAGNOSIS — M8589 Other specified disorders of bone density and structure, multiple sites: Secondary | ICD-10-CM | POA: Diagnosis not present

## 2021-09-23 DIAGNOSIS — Z780 Asymptomatic menopausal state: Secondary | ICD-10-CM | POA: Diagnosis not present

## 2021-10-02 DIAGNOSIS — J9610 Chronic respiratory failure, unspecified whether with hypoxia or hypercapnia: Secondary | ICD-10-CM | POA: Diagnosis not present

## 2021-10-12 ENCOUNTER — Ambulatory Visit: Payer: Medicare HMO | Admitting: Podiatry

## 2021-10-27 ENCOUNTER — Encounter: Payer: Self-pay | Admitting: Diagnostic Neuroimaging

## 2021-10-27 ENCOUNTER — Ambulatory Visit: Payer: Medicare HMO | Admitting: Diagnostic Neuroimaging

## 2021-10-27 ENCOUNTER — Telehealth: Payer: Self-pay

## 2021-10-27 VITALS — BP 144/92 | HR 82 | Ht 68.0 in | Wt 287.0 lb

## 2021-10-27 DIAGNOSIS — G629 Polyneuropathy, unspecified: Secondary | ICD-10-CM | POA: Diagnosis not present

## 2021-10-27 DIAGNOSIS — R6889 Other general symptoms and signs: Secondary | ICD-10-CM | POA: Diagnosis not present

## 2021-10-27 NOTE — Patient Instructions (Signed)
BILATERAL FOOT SWELLING / NUMBNESS / PAIN (neuropathy?)  - check neuropathy labs  - increase gabapentin to 300mg  at bedtime; then 300mg  twice a day
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
## 2021-10-27 NOTE — Telephone Encounter (Signed)
Requesting bone density test results, please call pt back.

## 2021-11-01 DIAGNOSIS — J9610 Chronic respiratory failure, unspecified whether with hypoxia or hypercapnia: Secondary | ICD-10-CM | POA: Diagnosis not present

## 2021-11-05 LAB — CBC WITH DIFFERENTIAL/PLATELET
Basophils Absolute: 0 10*3/uL (ref 0.0–0.2)
Basos: 0 %
EOS (ABSOLUTE): 0.1 10*3/uL (ref 0.0–0.4)
Eos: 1 %
Hematocrit: 48.8 % — ABNORMAL HIGH (ref 34.0–46.6)
Hemoglobin: 16.6 g/dL — ABNORMAL HIGH (ref 11.1–15.9)
Immature Grans (Abs): 0 10*3/uL (ref 0.0–0.1)
Immature Granulocytes: 0 %
Lymphocytes Absolute: 1.2 10*3/uL (ref 0.7–3.1)
Lymphs: 15 %
MCH: 29.7 pg (ref 26.6–33.0)
MCHC: 34 g/dL (ref 31.5–35.7)
MCV: 88 fL (ref 79–97)
Monocytes Absolute: 0.5 10*3/uL (ref 0.1–0.9)
Monocytes: 6 %
Neutrophils Absolute: 6 10*3/uL (ref 1.4–7.0)
Neutrophils: 78 %
Platelets: 144 10*3/uL — ABNORMAL LOW (ref 150–450)
RBC: 5.58 x10E6/uL — ABNORMAL HIGH (ref 3.77–5.28)
RDW: 13.8 % (ref 11.7–15.4)
WBC: 7.8 10*3/uL (ref 3.4–10.8)

## 2021-11-05 LAB — COMPREHENSIVE METABOLIC PANEL
ALT: 6 IU/L (ref 0–32)
AST: 10 IU/L (ref 0–40)
Albumin/Globulin Ratio: 2.1 (ref 1.2–2.2)
Albumin: 4.2 g/dL (ref 3.8–4.8)
Alkaline Phosphatase: 141 IU/L — ABNORMAL HIGH (ref 44–121)
BUN/Creatinine Ratio: 13 (ref 12–28)
BUN: 8 mg/dL (ref 8–27)
Bilirubin Total: 0.5 mg/dL (ref 0.0–1.2)
CO2: 26 mmol/L (ref 20–29)
Calcium: 9.2 mg/dL (ref 8.7–10.3)
Chloride: 98 mmol/L (ref 96–106)
Creatinine, Ser: 0.63 mg/dL (ref 0.57–1.00)
Globulin, Total: 2 g/dL (ref 1.5–4.5)
Glucose: 98 mg/dL (ref 70–99)
Potassium: 4.2 mmol/L (ref 3.5–5.2)
Sodium: 138 mmol/L (ref 134–144)
Total Protein: 6.2 g/dL (ref 6.0–8.5)
eGFR: 98 mL/min/{1.73_m2} (ref >59)

## 2021-11-05 LAB — MULTIPLE MYELOMA PANEL, SERUM
Albumin SerPl Elph-Mcnc: 3.5 g/dL (ref 2.9–4.4)
Albumin/Glob SerPl: 1.3 (ref 0.7–1.7)
Alpha 1: 0.3 g/dL (ref 0.0–0.4)
Alpha2 Glob SerPl Elph-Mcnc: 0.8 g/dL (ref 0.4–1.0)
B-Globulin SerPl Elph-Mcnc: 1 g/dL (ref 0.7–1.3)
Gamma Glob SerPl Elph-Mcnc: 0.6 g/dL (ref 0.4–1.8)
Globulin, Total: 2.7 g/dL (ref 2.2–3.9)
IgA/Immunoglobulin A, Serum: 177 mg/dL (ref 87–352)
IgG (Immunoglobin G), Serum: 662 mg/dL (ref 586–1602)
IgM (Immunoglobulin M), Srm: 108 mg/dL (ref 26–217)

## 2021-11-05 LAB — URIC ACID: Uric Acid: 3.5 mg/dL (ref 3.0–7.2)

## 2021-11-05 LAB — SJOGREN'S SYNDROME ANTIBODS(SSA + SSB)
ENA SSA (RO) Ab: <0.2 AI (ref 0.0–0.9)
ENA SSB (LA) Ab: <0.2 AI (ref 0.0–0.9)

## 2021-11-05 LAB — HEMOGLOBIN A1C
Est. average glucose Bld gHb Est-mCnc: 120 mg/dL
Hgb A1c MFr Bld: 5.8 % — ABNORMAL HIGH (ref 4.8–5.6)

## 2021-11-05 LAB — VITAMIN B12: Vitamin B-12: 391 pg/mL (ref 232–1245)

## 2021-11-05 LAB — ANA W/REFLEX: ANA Titer 1: NEGATIVE

## 2021-11-05 LAB — TSH: TSH: 1.98 u[IU]/mL (ref 0.450–4.500)

## 2021-11-09 ENCOUNTER — Telehealth: Payer: Self-pay

## 2021-11-09 ENCOUNTER — Telehealth: Payer: Self-pay | Admitting: Diagnostic Neuroimaging

## 2021-11-09 NOTE — Telephone Encounter (Signed)
-----  Message from Penni Bombard, MD sent at 11/09/2021  9:49 AM EST ----- Labs stable. Low platelets and elevated alk phos are stable (follow up with PCP). Other labs ok.  -VRP

## 2021-11-09 NOTE — Telephone Encounter (Signed)
I called the pt and relayed results of lab testing. Pt verbalized understanding. Pt did report since increasing her gabapentin to 300 mg bid her neuropathy sx have not improved. Pt reports she started the increased dosage on 10/28/2021, wanted to know if Dr. Leta Baptist had any other suggestions?

## 2021-11-09 NOTE — Telephone Encounter (Signed)
Pt. Called and and lvm asking for a call back from RN about her blood work results from her appt on 12/21. Please call back at earliest convenience.

## 2021-11-09 NOTE — Telephone Encounter (Signed)
Please advise 

## 2021-11-11 NOTE — Telephone Encounter (Signed)
Penumalli, Earlean Polka, MD  You 44 minutes ago (12:37 PM)   Increase gabapentin to 600mg  twice a day, then 600mg  three times a day. -VRP   I called the pt back and relayed recommendation for Dr. Leta Baptist. Pt verbalized understandings. She will try 600 mg bid for 1-2 weeks and then increase 600 mg tid after if need be. Pt will let us know how this dosage works out for her.

## 2021-11-15 ENCOUNTER — Other Ambulatory Visit: Payer: Self-pay | Admitting: Physician Assistant

## 2021-12-02 DIAGNOSIS — J9610 Chronic respiratory failure, unspecified whether with hypoxia or hypercapnia: Secondary | ICD-10-CM | POA: Diagnosis not present

## 2021-12-06 ENCOUNTER — Other Ambulatory Visit: Payer: Self-pay | Admitting: Physician Assistant

## 2021-12-07 NOTE — Telephone Encounter (Signed)
Last filled 12/23

## 2021-12-29 DIAGNOSIS — M1712 Unilateral primary osteoarthritis, left knee: Secondary | ICD-10-CM | POA: Diagnosis not present

## 2022-01-02 DIAGNOSIS — J9610 Chronic respiratory failure, unspecified whether with hypoxia or hypercapnia: Secondary | ICD-10-CM | POA: Diagnosis not present

## 2022-01-28 ENCOUNTER — Telehealth: Payer: Self-pay

## 2022-01-28 NOTE — Telephone Encounter (Signed)
Patient contacted to schedule mammogram.  ? ?RE: Mobile Mammo event located at: ? ?[facility]  ?      ?[address]    ?[place] [address]    ? ?Date: April 7th  ? ?LVM for pt to call back as soon as possible.  ? ? ?

## 2022-01-30 DIAGNOSIS — J9610 Chronic respiratory failure, unspecified whether with hypoxia or hypercapnia: Secondary | ICD-10-CM | POA: Diagnosis not present

## 2022-02-02 DIAGNOSIS — D1801 Hemangioma of skin and subcutaneous tissue: Secondary | ICD-10-CM | POA: Diagnosis not present

## 2022-02-02 DIAGNOSIS — L821 Other seborrheic keratosis: Secondary | ICD-10-CM | POA: Diagnosis not present

## 2022-02-02 DIAGNOSIS — L738 Other specified follicular disorders: Secondary | ICD-10-CM | POA: Diagnosis not present

## 2022-03-02 DIAGNOSIS — J9610 Chronic respiratory failure, unspecified whether with hypoxia or hypercapnia: Secondary | ICD-10-CM | POA: Diagnosis not present

## 2022-03-14 ENCOUNTER — Other Ambulatory Visit: Payer: Self-pay | Admitting: Podiatry

## 2022-03-16 ENCOUNTER — Ambulatory Visit (INDEPENDENT_AMBULATORY_CARE_PROVIDER_SITE_OTHER): Payer: Medicare HMO | Admitting: Physician Assistant

## 2022-03-16 ENCOUNTER — Encounter: Payer: Self-pay | Admitting: Physician Assistant

## 2022-03-16 DIAGNOSIS — F429 Obsessive-compulsive disorder, unspecified: Secondary | ICD-10-CM

## 2022-03-16 DIAGNOSIS — F3342 Major depressive disorder, recurrent, in full remission: Secondary | ICD-10-CM

## 2022-03-16 DIAGNOSIS — G4700 Insomnia, unspecified: Secondary | ICD-10-CM

## 2022-03-16 DIAGNOSIS — F411 Generalized anxiety disorder: Secondary | ICD-10-CM

## 2022-03-16 MED ORDER — TRAZODONE HCL 150 MG PO TABS: 225.0000 mg | ORAL_TABLET | Freq: Every evening | ORAL | 1 refills | Status: DC | PRN

## 2022-03-16 MED ORDER — ALPRAZOLAM 1 MG PO TABS: ORAL_TABLET | ORAL | 5 refills | Status: DC

## 2022-03-16 NOTE — Progress Notes (Signed)
[facility] ? ?Patient ID: [name] [name],  ?MRN: [id number] ? ?PCP: Iona Beard, MD ? ?Date of Evaluation: 03/16/2022 ?Time spent:20 minutes ? ?Chief Complaint:  ?Chief Complaint   ?Anxiety; Follow-up ?  ? ? ?HISTORY/CURRENT STATUS: ?HPI For routine med check.  ? ?Has had a very stressful year.  Her husband was in and out of the hospital a couple of times, went to a nursing home for rehab which "I would not wish on a dog," and is now back home but is in therapy daily.  So things have finally calmed down after his injuries sustained in a fall last April.  States she came close to having a nervous breakdown but things are better now. ? ?She is able to enjoy some things.  Energy and motivation are low, partly due to her weight though.  Does not feel depressed.  ADLs and personal hygiene are normal.  Appetite has not changed.   No extreme sadness, tearfulness, or feelings of hopelessness.  Sleeps well most of the time.  Uses the trazodone when needed.  Denies any changes in concentration, making decisions or remembering things.  Not isolating.  Is active in church.  Denies suicidal or homicidal thoughts. ? ?Still gets extremely anxious at times, notices it often after she stands up, an example she uses is if she gets up out of the car to go around to get the mail she gets weak in the knees and her legs will start shaking and she feels panicky.  Denies being dizzy or feeling faint.  If she has to get up and go to the bathroom at night she will hold onto the wall because she feels like she is going to fall, especially after she first gets up.  She feels panicky in those type situations.  She also has a lot of anxiety still when on a trip, even though she is not the driver.  Being on the road scares her to death.  Xanax is helpful but sometimes when she is already in a state of panic that it does not help as well as it would if she takes it before hand.  She has only been taking up to 1.5 mg of Xanax  total per day. ? ?Patient denies increased energy with decreased need for sleep, no increased talkativeness, no racing thoughts, no impulsivity or risky behaviors, no increased spending, no increased libido, no grandiosity, no paranoia, no hallucinations. ? ?Denies dizziness, syncope, seizures, numbness, tingling, tremor, tics, unsteady gait, slurred speech, confusion.  ? ?Individual Medical History/ Review of Systems: Changes? :Yes   planning to start the process of Gastric bypass surgery. ? ?Past medications for mental health diagnoses include: ?Wellbutrin, Xanax, Zoloft, trazodone, gabapentin, BuSpar, Ativan, Klonopin ? ?Allergies: Ibuprofen and Clonazepam ? ?Current Medications:  ?Current Outpatient Medications:  ?  acetaminophen (TYLENOL) 500 MG tablet, Take 1,000 mg by mouth every 6 (six) hours as needed (for pain OR back pain). , Disp: , Rfl:  ?  albuterol (VENTOLIN HFA) 108 (90 Base) MCG/ACT inhaler, Inhale 2 puffs into the lungs every 6 (six) hours as needed for wheezing or shortness of breath., Disp: 8 g, Rfl: 5 ?  atorvastatin (LIPITOR) 10 MG tablet, Take 1 tablet (10 mg total) by mouth daily., Disp: 90 tablet, Rfl: 3 ?  famotidine (PEPCID) 10 MG tablet, Take 1 tablet (10 mg total) by mouth 2 (two) times daily. May increase to 2 pills twice a day if no improvement after 2 weeks, Disp: 60 tablet,  Rfl: 3 ?  gabapentin (NEURONTIN) 100 MG capsule, TAKE 1 CAPSULE(100 MG) BY MOUTH THREE TIMES DAILY, Disp: 90 capsule, Rfl: 3 ?  methocarbamol (ROBAXIN) 500 MG tablet, Take 1 tablet (500 mg total) by mouth every 8 (eight) hours as needed for muscle spasms., Disp: 30 tablet, Rfl: 0 ?  pantoprazole (PROTONIX) 20 MG tablet, TAKE 1 TABLET(20 MG) BY MOUTH DAILY, Disp: 90 tablet, Rfl: 1 ?  sertraline (ZOLOFT) 100 MG tablet, TAKE 3 TABLETS(300 MG) BY MOUTH DAILY, Disp: 270 tablet, Rfl: 3 ?  ALPRAZolam (XANAX) 1 MG tablet, TAKE 1/2 TO 1 TABLET(0.5 TO 1 MG) BY MOUTH THREE TIMES DAILY AS NEEDED FOR ANXIETY, Disp: 75 tablet,  Rfl: 5 ?  celecoxib (CELEBREX) 100 MG capsule, Take 100 mg by mouth 2 (two) times daily as needed. (Patient not taking: Reported on 03/16/2022), Disp: , Rfl:  ?  fluticasone furoate-vilanterol (BREO ELLIPTA) 100-25 MCG/INH AEPB, Inhale 1 puff into the lungs daily. (Patient not taking: Reported on 03/16/2022), Disp: 28 each, Rfl: 5 ?  traZODone (DESYREL) 150 MG tablet, Take 1.5 tablets (225 mg total) by mouth at bedtime as needed for sleep., Disp: 135 tablet, Rfl: 1 ?Medication Side Effects: none ? ?Family Medical/ Social History: Changes? No ? ?MENTAL HEALTH EXAM: ? ?There were no vitals taken for this visit.There is no height or weight on file to calculate BMI.  ?General Appearance: Casual, Well Groomed, and Obese  ?Eye Contact:  Good  ?Speech:  Clear and Coherent and Normal Rate  ?Volume:  Normal  ?Mood:  Euthymic  ?Affect:  Congruent  ?Thought Process:  Goal Directed and Descriptions of Associations: Intact  ?Orientation:  Full (Time, Place, and Person)  ?Thought Content: Logical   ?Suicidal Thoughts:  No  ?Homicidal Thoughts:  No  ?Memory:  WNL  ?Judgement:  Good  ?Insight:  Good  ?Psychomotor Activity:  Normal and walks w/ 4 prong cane  ?Concentration:  Concentration: Good and Attention Span: Good  ?Recall:  Good  ?Fund of Knowledge: Good  ?Language: Good  ?Assets:  Desire for Improvement  ?ADL's:  Intact  ?Cognition: WNL  ?Prognosis:  Good  ? ?Labs 10/27/2021 on chart reviewed. ? ?DIAGNOSES:  ?  ICD-10-CM   ?1. Generalized anxiety disorder  F41.1   ?  ?2. Recurrent major depressive disorder, in full remission ([place])  F33.42   ?  ?3. Obsessive-compulsive disorder, unspecified type  F42.9   ?  ?4. Insomnia, unspecified type  G47.00   ?  ? ? ?Receiving Psychotherapy: No  ? ? ?RECOMMENDATIONS:  ?PDMP reviewed.  Xanax filled 02/12/2022. ?I provided 20 minutes of face to face time during this encounter, including time spent before and after the visit in records review, medical decision making, counseling pertinent to  today's visit, and charting.  ?We discussed the anxiety.  I reminded her that she can take up to 2.5 mg daily but take the least effective amount.  I also recommend she talk to her PCP about the shakiness she feels in her legs and lightheadedness when she stands up, I do think it is anxiety related but could possibly be orthostatic hypotension as well and she is extremely afraid of falling so she freezes. ? ?Continue Xanax 1 mg, 1/2-1 3 times daily as needed but no more than 2.5 pills daily. ?Continue Zoloft 100 mg, 3 p.o. daily. ?Continue trazodone 150 mg, 1 p.o. nightly as needed sleep. ?Return in 3 months. ? ?Donnal Moat, PA-C  ?

## 2022-03-17 DIAGNOSIS — H524 Presbyopia: Secondary | ICD-10-CM | POA: Diagnosis not present

## 2022-03-17 DIAGNOSIS — H5213 Myopia, bilateral: Secondary | ICD-10-CM | POA: Diagnosis not present

## 2022-03-17 DIAGNOSIS — H2513 Age-related nuclear cataract, bilateral: Secondary | ICD-10-CM | POA: Diagnosis not present

## 2022-03-17 DIAGNOSIS — Z135 Encounter for screening for eye and ear disorders: Secondary | ICD-10-CM | POA: Diagnosis not present

## 2022-03-17 DIAGNOSIS — H52223 Regular astigmatism, bilateral: Secondary | ICD-10-CM | POA: Diagnosis not present

## 2022-04-01 DIAGNOSIS — J9610 Chronic respiratory failure, unspecified whether with hypoxia or hypercapnia: Secondary | ICD-10-CM | POA: Diagnosis not present

## 2022-04-11 ENCOUNTER — Encounter: Payer: Self-pay | Admitting: Student

## 2022-04-11 ENCOUNTER — Ambulatory Visit (INDEPENDENT_AMBULATORY_CARE_PROVIDER_SITE_OTHER): Payer: Medicare HMO | Admitting: Internal Medicine

## 2022-04-11 ENCOUNTER — Other Ambulatory Visit: Payer: Self-pay

## 2022-04-11 VITALS — BP 136/71 | HR 80 | Temp 97.9°F | Ht 68.0 in | Wt 296.4 lb

## 2022-04-11 DIAGNOSIS — K219 Gastro-esophageal reflux disease without esophagitis: Secondary | ICD-10-CM

## 2022-04-11 DIAGNOSIS — Z0000 Encounter for general adult medical examination without abnormal findings: Secondary | ICD-10-CM

## 2022-04-11 DIAGNOSIS — Z6841 Body Mass Index (BMI) 40.0 and over, adult: Secondary | ICD-10-CM

## 2022-04-11 MED ORDER — SEMAGLUTIDE(0.25 OR 0.5MG/DOS) 2 MG/3ML ~~LOC~~ SOPN: 0.2500 mg | PEN_INJECTOR | SUBCUTANEOUS | 0 refills | Status: DC

## 2022-04-11 MED ORDER — PANTOPRAZOLE SODIUM 20 MG PO TBEC: 20.0000 mg | DELAYED_RELEASE_TABLET | Freq: Every day | ORAL | 1 refills | Status: DC

## 2022-04-11 NOTE — Assessment & Plan Note (Signed)
Defers HC maintenance at this time. Will re-address during 4 week follow up.

## 2022-04-11 NOTE — Assessment & Plan Note (Signed)
Pt requesting bariatric surgery referral. She states that she has tried numerous diets, which have not resulted in any meaningful weight loss. BMI 45 today. She has joint problems, particularly in her knees 2/2 to her weight. She reports poor and reduced QOL and ROM due to her weight. She has thought about this extensively, and reports that she is interested in weight loss surgery at this time. Given hx of pre-diabetes, with A1c of 5.8 five months ago, I believe that she would benefit from [place] at this time as well; she is agreeable to trying this. Discussed dosing and adverse effects.  -Referral for bariatric surgery placed  -Ozempic prescribed; 0.25 mg weekly x 4 weeks, then 0.5 mg weekly x 4 weeks, then 1 mg weekly x 4 weeks, and then 2 mg weekly if tolerates medication well.  -Follow up in 4 weeks.
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
## 2022-04-11 NOTE — Patient Instructions (Signed)
Weight loss: I have placed a referral to surgery for bariatric surgery I have ordered Ozempic to your pharmacy. Please start by taking 0.25 mg once a week for 4 weeks. Then, start taking 0.50 mg one a week for 4 weeks. We will see you back in clinic in 4 weeks to discuss how you tolerate the medications, and then determine next dosage.

## 2022-04-14 NOTE — Progress Notes (Signed)
Internal Medicine Clinic Attending  Case discussed with Dr. Patel  At the time of the visit.  We reviewed the resident's history and exam and pertinent patient test results.  I agree with the assessment, diagnosis, and plan of care documented in the resident's note.  

## 2022-04-18 ENCOUNTER — Other Ambulatory Visit: Payer: Self-pay | Admitting: Physician Assistant

## 2022-04-19 NOTE — Telephone Encounter (Signed)
Filled 4/7

## 2022-05-02 DIAGNOSIS — J9610 Chronic respiratory failure, unspecified whether with hypoxia or hypercapnia: Secondary | ICD-10-CM | POA: Diagnosis not present

## 2022-05-12 ENCOUNTER — Other Ambulatory Visit: Payer: Self-pay | Admitting: Physician Assistant

## 2022-05-12 NOTE — Telephone Encounter (Signed)
Verify dose, most recent note is 1 tab

## 2022-05-16 ENCOUNTER — Ambulatory Visit (INDEPENDENT_AMBULATORY_CARE_PROVIDER_SITE_OTHER): Payer: Medicare HMO | Admitting: Internal Medicine

## 2022-05-16 ENCOUNTER — Other Ambulatory Visit: Payer: Self-pay

## 2022-05-16 VITALS — BP 109/65 | HR 77 | Temp 97.9°F | Resp 28 | Ht 68.0 in | Wt 282.6 lb

## 2022-05-16 DIAGNOSIS — Z6841 Body Mass Index (BMI) 40.0 and over, adult: Secondary | ICD-10-CM | POA: Diagnosis not present

## 2022-05-16 DIAGNOSIS — Z0000 Encounter for general adult medical examination without abnormal findings: Secondary | ICD-10-CM

## 2022-05-16 MED ORDER — SEMAGLUTIDE(0.25 OR 0.5MG/DOS) 2 MG/3ML ~~LOC~~ SOPN: 0.5000 mg | PEN_INJECTOR | SUBCUTANEOUS | 0 refills | Status: DC
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
## 2022-05-16 NOTE — Patient Instructions (Addendum)
[name], it was a pleasure seeing you today!  Today we discussed: Weight loss- Awesome work of losing 14 lbs! Keep it up! I would like you to increase the dose of ozempic from 0.25 to 0.5 mg weekly. Please follow-up in person or by telehealth in 4 weeks and we can talk about increasing the dose again if you are tolerating medication well.   I have ordered the following labs today:  Lab Orders  No laboratory test(s) ordered today     I will call if any are abnormal. All of your labs can be accessed through "My Chart"   My Chart Access: [email or web address]   Referrals ordered today:   Referral Orders  No referral(s) requested today     I have ordered the following medication/changed the following medications:   Stop the following medications: There are no discontinued medications.   Start the following medications: No orders of the defined types were placed in this encounter.    Follow-up: 4 weeks, if you are tolerating dose of ozempic we will continue gradually increasing. It is ok to do telehealth or inperson for that visit.   Please make sure to arrive 15 minutes prior to your next appointment. If you arrive late, you may be asked to reschedule.   We look forward to seeing you next time. Please call our clinic at [phone] if you have any questions or concerns. The best time to call is Monday-Friday from 9am-4pm, but there is someone available 24/7. If after hours or the weekend, call the main hospital number and ask for the Internal Medicine Resident On-Call. If you need medication refills, please notify your pharmacy one week in advance and they will send [place] a request.  Thank you for letting us take part in your care. Wishing you the best!  Thank you, Dr. [facility]

## 2022-05-16 NOTE — Assessment & Plan Note (Addendum)
[name] [name] presents for follow-up after initiating semaglutide therapy.  She states that she has been injecting 0.25 mg once weekly for the last 4 weeks.  She denies nausea and diarrhea.  She has been trying to avoid eating sweets in chips.  After starting the injections she has noticed decreased appetite that helps her not snacks much.  She has lost 14 pounds since visit last month. A/P: Patient was previously referred for bariatric surgery at last office visit.  She has not seen them yet and with success of low-dose semaglutide in the last 4 weeks I wonder if surgery may be necessary.  I talked with patient about titrating dose up and continuing with bariatric referral at this time. -Increase semaglutide 0.25 mg to 0.5 mg weekly -Follow-up in 4 weeks

## 2022-05-16 NOTE — Assessment & Plan Note (Signed)
[name] [name] is not interested in colonoscopy.  She had 1 greater than 10 years ago and does not want to have another one.  FOBT testing not indicated as patient would not get colonoscopy if this was positive.

## 2022-05-19 NOTE — Progress Notes (Signed)
Internal Medicine Clinic Attending  Case discussed with the resident at the time of the visit.  We reviewed the resident's history and exam and pertinent patient test results.  I agree with the assessment, diagnosis, and plan of care documented in the resident's note.  

## 2022-06-05 ENCOUNTER — Ambulatory Visit (INDEPENDENT_AMBULATORY_CARE_PROVIDER_SITE_OTHER): Payer: Medicare HMO

## 2022-06-05 ENCOUNTER — Ambulatory Visit (HOSPITAL_COMMUNITY)
Admission: EM | Admit: 2022-06-05 | Discharge: 2022-06-05 | Disposition: A | Discharge status: Home / Self Care | Payer: Medicare HMO | Attending: Physician Assistant | Admitting: Physician Assistant

## 2022-06-05 ENCOUNTER — Encounter (HOSPITAL_COMMUNITY): Payer: Self-pay | Admitting: Emergency Medicine

## 2022-06-05 DIAGNOSIS — R0789 Other chest pain: Secondary | ICD-10-CM | POA: Diagnosis not present

## 2022-06-05 DIAGNOSIS — S299XXA Unspecified injury of thorax, initial encounter: Secondary | ICD-10-CM | POA: Diagnosis not present

## 2022-06-05 MED ORDER — LIDOCAINE 4 % EX PTCH: 1.0000 | MEDICATED_PATCH | CUTANEOUS | 0 refills | Status: DC

## 2022-06-05 NOTE — ED Provider Notes (Addendum)
[facility]    CSN: [id number] Arrival date & time: 06/05/22  1610      History   Chief Complaint Chief Complaint  Patient presents with   Rib Injury    HPI [name] [name] is a 66 y.o. female.   Patient presents today for evaluation of right rib pain.  On intake she was noted to have oxygen saturation of 86%.  She reports a history of COPD with home oxygen but did not bring her oxygen here.  After taking several deep breaths and resting her oxygen improved to 92%.  She denies any shortness of breath, chest pain, lightheadedness.  Her primary concern today is right rib pain.  Reports that yesterday she dropped a medication bottle top and leaned forward to pick this up.  She felt a pop in her right anterior ribs that has been ongoing since that time.  She reports minimal pain at rest but extreme pain with forward flexion, rotation, palpation.  She has been taking Tylenol without improvement of symptoms.  She denies any history of osteoporosis or pathological rib fractures in the past.    Past Medical History:  Diagnosis Date   Anxiety    Asthma    Chronic, with restrictive component 2/2 obesity   Depression    GERD (gastroesophageal reflux disease)    Headache    Insomnia    Obesity    On home oxygen therapy    "3L at night only when I get sick" (04/30/2014)   Shortness of breath dyspnea    Sleep apnea    "couldn't afford mask so I didn't get it" (04/30/2014)    Patient Active Problem List   Diagnosis Date Noted   Pneumonia due to COVID-19 virus 12/02/2020   COVID-19 with pulmonary comorbidity 12/02/2020   Thrombocytopenia due to COVID-19 virus 12/02/2020   Leukopenia 12/02/2020   Cellulitis of finger of left hand 08/08/2020   Acute on chronic respiratory failure with hypoxemia ([place]) 06/30/2020   Fall from ground level 06/16/2020   Shortness of breath 06/16/2020   Hypoxia, sleep related 06/13/2019   Posterior right knee pain 02/06/2019    Disequilibrium 02/06/2019   OCD (obsessive compulsive disorder) 08/13/2018   Aortic atherosclerosis ([place]) 08/17/2016   Rash and nonspecific skin eruption 12/17/2015   Periodic limb movement disorder 08/14/2015   Obesity hypoventilation syndrome ([place]) 01/21/2015   Health care maintenance 07/01/2014   Asthma 05/28/2013   OSA (obstructive sleep apnea) 04/04/2013   Anxiety disorder 07/02/2012   Depression 06/28/2012   Morbid obesity ([place]) 03/02/2012   GERD (gastroesophageal reflux disease) 02/05/2012    Past Surgical History:  Procedure Laterality Date   APPENDECTOMY  ~ 2002   VAGINAL HYSTERECTOMY  ~ 1978    OB History   No obstetric history on file.      Home Medications    Prior to Admission medications   Medication Sig Start Date End Date Taking? Authorizing Provider  acetaminophen (TYLENOL) 500 MG tablet Take 1,000 mg by mouth every 6 (six) hours as needed (for pain OR back pain).    Yes [provider]  albuterol (VENTOLIN HFA) 108 (90 Base) MCG/ACT inhaler Inhale 2 puffs into the lungs every 6 (six) hours as needed for wheezing or shortness of breath. 06/16/20  Yes Andrew Au, MD  ALPRAZolam Duanne Moron) 1 MG tablet TAKE 1/2 TO 1 TABLET(0.5 TO 1 MG) BY MOUTH THREE TIMES DAILY AS NEEDED FOR ANXIETY 04/19/22  Yes Donnal Moat T, PA-C  atorvastatin (  LIPITOR) 10 MG tablet Take 1 tablet (10 mg total) by mouth daily. 05/31/21  Yes Iona Beard, MD  famotidine (PEPCID) 10 MG tablet Take 1 tablet (10 mg total) by mouth 2 (two) times daily. May increase to 2 pills twice a day if no improvement after 2 weeks 06/13/19  Yes Bartholomew Crews, MD  fluticasone furoate-vilanterol (BREO ELLIPTA) 100-25 MCG/INH AEPB Inhale 1 puff into the lungs daily. 06/16/20  Yes Andrew Au, MD  gabapentin (NEURONTIN) 100 MG capsule TAKE 1 CAPSULE(100 MG) BY MOUTH THREE TIMES DAILY 03/14/22  Yes Trula Slade, DPM  lidocaine (HM LIDOCAINE PATCH) 4 % Place 1 patch onto the skin daily. Apply  patch for 12 hours.  Remove for 12 hours before replacing.  Use only 1 patch per 24 hours. 06/05/22  Yes Katlin Ciszewski K, PA-C  pantoprazole (PROTONIX) 20 MG tablet Take 1 tablet (20 mg total) by mouth daily. 04/11/22  Yes Lajean Manes, MD  Semaglutide,0.25 or 0.'5MG'$ /DOS, 2 MG/3ML SOPN Inject 0.5 mg into the skin once a week. 05/16/22  Yes [name], [name], DO  sertraline (ZOLOFT) 100 MG tablet TAKE 3 TABLETS(300 MG) BY MOUTH DAILY 05/24/21  Yes Hurst, Teresa T, PA-C  traZODone (DESYREL) 150 MG tablet Take 1 tablet (150 mg total) by mouth at bedtime. 05/12/22  Yes Addison Lank, PA-C    Family History Family History  Problem Relation Age of Onset   Breast cancer Mother    Alcohol abuse Mother    Emphysema Mother        smoked   Asthma Mother    Breast cancer Maternal Aunt    Breast cancer Maternal Grandmother     Social History Social History   Tobacco Use   Smoking status: Former    Packs/day: 0.00    Years: 44.00    Total pack years: 0.00    Types: Cigarettes    Quit date: 11/07/2012    Years since quitting: 9.5   Smokeless tobacco: Never  Substance Use Topics   Alcohol use: No    Alcohol/week: 0.0 standard drinks of alcohol   Drug use: No     Allergies   Ibuprofen and Clonazepam   Review of Systems Review of Systems  Constitutional:  Positive for activity change. Negative for appetite change, fatigue and fever.  Respiratory:  Negative for cough and shortness of breath.   Cardiovascular:  Positive for chest pain (right rib pain).  Gastrointestinal:  Negative for abdominal pain, diarrhea, nausea and vomiting.     Physical Exam Triage Vital Signs ED Triage Vitals [06/05/22 1716]  Enc Vitals Group     BP 122/60     Pulse Rate 77     Resp      Temp 98.2 F (36.8 C)     Temp Source Oral     SpO2 (!) 86 %     Weight      Height      Head Circumference      Peak Flow      Pain Score      Pain Loc      Pain Edu?      Excl. in [place]?    No data found.  Updated  Vital Signs BP 122/60 (BP Location: Right Arm)   Pulse 77   Temp 98.2 F (36.8 C) (Oral)   SpO2 92%   Visual Acuity Right Eye Distance:   Left Eye Distance:   Bilateral Distance:    Right Eye Near:  Left Eye Near:    Bilateral Near:     Physical Exam Vitals reviewed.  Constitutional:      General: She is awake. She is not in acute distress.    Appearance: Normal appearance. She is well-developed. She is not ill-appearing.     Comments: Very pleasant female appears stated age in no acute distress sitting in wheelchair  HENT:     Head: Normocephalic and atraumatic.  Cardiovascular:     Rate and Rhythm: Normal rate and regular rhythm.     Heart sounds: Normal heart sounds, S1 normal and S2 normal. No murmur heard. Pulmonary:     Effort: Pulmonary effort is normal.     Breath sounds: Normal breath sounds. No wheezing, rhonchi or rales.     Comments: Clear to auscultation bilaterally diminished breath sounds in bases bilaterally. Chest:     Chest wall: Tenderness present. No deformity or swelling.       Comments: Significant tenderness palpation over anterior and inferior rib cage without deformity. Abdominal:     General: Bowel sounds are normal.     Palpations: Abdomen is soft.     Tenderness: There is no abdominal tenderness.  Psychiatric:        Behavior: Behavior is cooperative.      UC Treatments / Results  Labs (all labs ordered are listed, but only abnormal results are displayed) Labs Reviewed - No data to display  EKG   Radiology DG Ribs Unilateral W/Chest Right  Result Date: 06/05/2022 CLINICAL DATA:  Injury. EXAM: RIGHT RIBS AND CHEST - 3+ VIEW COMPARISON:  Chest x-ray 12/02/2020 FINDINGS: No fracture or other bone lesions are seen involving the ribs. There is no evidence of pneumothorax or pleural effusion. Both lungs are clear. Heart size and mediastinal contours are within normal limits. IMPRESSION: Negative. Electronically Signed   By: Ronney Asters  M.D.   On: 06/05/2022 18:27    Procedures Procedures (including critical care time)  Medications Ordered in UC Medications - No data to display  Initial Impression / Assessment and Plan / UC Course  I have reviewed the triage vital signs and the nursing notes.  Pertinent labs & imaging results that were available during my care of the patient were reviewed by me and considered in my medical decision making (see chart for details).     X-ray obtained given focal tenderness that showed no osseous abnormality.  Suspect muscle strain as etiology of symptoms.  Patient is unable to take NSAIDs due to history of anaphylactic reaction.  She is prescribed chronic benzodiazepines I discussed that it is not appropriate to use opioids in this case particularly given history of COPD.  She was prescribed lidocaine patch for pain relief.  Discussed that she should place 1 patch on her skin for 12 hours then remove it for 12 hours using only 1 patch per 24 hours.  Discussed the importance of deep breathing in order to prevent atelectasis or pneumonia.   She is to follow-up closely with her primary care provider.  Discussed that if she has any worsening symptoms including shortness of breath, worsening pain, increased cough she needs to go to the emergency room.  Final Clinical Impressions(s) / UC Diagnoses   Final diagnoses:  Right-sided chest wall pain     Discharge Instructions      Your x-ray did not show any evidence for fracture.  I believe that you have a muscle sprain as your symptoms.  Use lidocaine patches for pain relief.  You can use patch for 12 hours and then remove it for 12 hours.  You should only use 1 patch per 24 hours.  You can use over-the-counter medication for additional symptom relief.  Avoid any strenuous activity.  If you have any worsening symptoms you need to be seen immediately including shortness of breath, increased pain.  Make sure you are using your oxygen at home since  your oxygen was slightly low here today.     ED Prescriptions     Medication Sig Dispense Auth. Provider   lidocaine (HM LIDOCAINE PATCH) 4 % Place 1 patch onto the skin daily. Apply patch for 12 hours.  Remove for 12 hours before replacing.  Use only 1 patch per 24 hours. 14 patch Olamae Ferrara K, PA-C      I have reviewed the PDMP during this encounter.   Terrilee Croak, PA-C 06/05/22 1833    Terrilee Croak, PA-C 06/05/22 1836

## 2022-06-05 NOTE — Discharge Instructions (Addendum)
Your x-ray did not show any evidence for fracture.  I believe that you have a muscle sprain as your symptoms.  Use lidocaine patches for pain relief.  You can use patch for 12 hours and then remove it for 12 hours.  You should only use 1 patch per 24 hours.  You can use over-the-counter medication for additional symptom relief.  Avoid any strenuous activity.  If you have any worsening symptoms you need to be seen immediately including shortness of breath, increased pain.  Make sure you are using your oxygen at home since your oxygen was slightly low here today.

## 2022-06-05 NOTE — ED Triage Notes (Signed)
Patient states that she was opening some medicine last night and dropped the cap, reached for it and heard a pop.  This morning she was leaning down and began having more pain on her right side and believes the rib is broken.  Patient has taken Tylenol for pain.  Denies any SOB.

## 2022-06-05 NOTE — ED Notes (Signed)
Baxter Flattery, CMA, notified this nurse of o2 sats 88.  Went to patient room.  Patient appears unremarkable.  Patient speaking in complete sentences, patient does wear oxygen at night.  Did place o2 sat one left middle finger, sats did go up to 94% on room air, when not talking.  Verna Czech, PA at bedside.

## 2022-06-09 ENCOUNTER — Inpatient Hospital Stay: Admission: RE | Admit: 2022-06-09 | Payer: Medicare HMO | Source: Ambulatory Visit

## 2022-06-17 ENCOUNTER — Other Ambulatory Visit: Payer: Self-pay

## 2022-06-17 ENCOUNTER — Ambulatory Visit (INDEPENDENT_AMBULATORY_CARE_PROVIDER_SITE_OTHER): Payer: Self-pay | Admitting: Physician Assistant

## 2022-06-17 ENCOUNTER — Other Ambulatory Visit: Payer: Self-pay | Admitting: Student

## 2022-06-17 ENCOUNTER — Telehealth: Payer: Self-pay | Admitting: Physician Assistant

## 2022-06-17 ENCOUNTER — Other Ambulatory Visit: Payer: Self-pay | Admitting: Physician Assistant

## 2022-06-17 DIAGNOSIS — Z91199 Patient's noncompliance with other medical treatment and regimen due to unspecified reason: Secondary | ICD-10-CM

## 2022-06-17 MED ORDER — ALPRAZOLAM 1 MG PO TABS: ORAL_TABLET | ORAL | 0 refills | Status: DC

## 2022-06-17 MED ORDER — SERTRALINE HCL 100 MG PO TABS: ORAL_TABLET | ORAL | 0 refills | Status: DC

## 2022-06-17 NOTE — Progress Notes (Signed)
No show

## 2022-06-17 NOTE — Telephone Encounter (Signed)
PENDED

## 2022-06-17 NOTE — Telephone Encounter (Signed)
Next visit is 07/04/22. [name] [name] is requesting a refill on her Alprazolam 1 mg and Sertraline 100 mg called to:  [facility], [place] - [address] AT [address] [place]  Phone:  [phone]  Fax:  [phone]

## 2022-06-20 ENCOUNTER — Ambulatory Visit (INDEPENDENT_AMBULATORY_CARE_PROVIDER_SITE_OTHER): Payer: Medicare HMO | Admitting: Student

## 2022-06-20 ENCOUNTER — Other Ambulatory Visit: Payer: Self-pay | Admitting: Student

## 2022-06-20 ENCOUNTER — Encounter: Payer: Self-pay | Admitting: Student

## 2022-06-20 DIAGNOSIS — Z139 Encounter for screening, unspecified: Secondary | ICD-10-CM

## 2022-06-20 DIAGNOSIS — Z6841 Body Mass Index (BMI) 40.0 and over, adult: Secondary | ICD-10-CM | POA: Diagnosis not present
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
## 2022-06-20 NOTE — Assessment & Plan Note (Addendum)
Patient here for follow up after last OV for weight management. She was seen on 7/10 and instructed to increase her weekly dose of Ozempic to 0.5 mg weekly. However, patient was confused about this and patient's husband has continued injecting her with 0.25 mg weekly. Patient has los 9lb since last visit, 23lb since she started therapy. She continues to tolerate treatment without GI side effects. She is still reporting decreased appetite, which is the desired effect. Patient is now engaged with weight loss surgery team as she is going through the process to get a lap band. Patient's mood is optimistic about the upcoming weight changes. She was deeply apologetic for the confusion.   Will plan to uptitrate dose today. Mentioned to patient that the highest dose approved for weight loss is 2 mg but that we would like to do so in small increments to test her tolerability. She understood and would like to proceed with plan. - Increase Ozempic to 0.5 mg weekly - Follow up in 4 weeks

## 2022-06-20 NOTE — Patient Instructions (Addendum)
Thank you, [name] [name] for allowing [place] to provide your care today!  Today we discussed you weight. We would like you to start injecting 0.5 mg weekly. This means, turn the dial until you see 0.5.  We want to make sure you can tolerate this dose before we decide to continue increasing the dose for weight loss. Continue making the diet and exercise changes.  If you experience any nausea, vomiting, or diarrhea, we would like to know. Usually this is mild, and passes quickly after the new dose increase. Make sure you stay hydrated. If you feel like it is severe, call us and we can likely go back to the old dose.    Please follow-up in in 4 weeks.  Please make sure to arrive 15 minutes prior to your next appointment. If you arrive late, you may be asked to reschedule.    We look forward to seeing you next time. Please call our clinic at [phone] if you have any questions or concerns. The best time to call is Monday-Friday from 9am-4pm, but there is someone available 24/7. If after hours or the weekend, call the main hospital number and ask for the Internal Medicine Resident On-Call. If you need medication refills, please notify your pharmacy one week in advance and they will send [place] a request.   Thank you for letting us take part in your care. Wishing you the best!  Romana Juniper, MD 06/20/2022, 2:26 PM [facility] Internal Medicine Resident, PGY-1

## 2022-06-21 NOTE — Progress Notes (Signed)
Internal Medicine Clinic Attending  I saw and evaluated the patient.  I personally confirmed the key portions of the history and exam documented by Dr. Gomez and I reviewed pertinent patient test results.  The assessment, diagnosis, and plan were formulated together and I agree with the documentation in the resident's note.  

## 2022-06-28 ENCOUNTER — Other Ambulatory Visit: Payer: Self-pay | Admitting: Student

## 2022-06-28 MED ORDER — SEMAGLUTIDE(0.25 OR 0.5MG/DOS) 2 MG/3ML ~~LOC~~ SOPN: 0.5000 mg | PEN_INJECTOR | SUBCUTANEOUS | 0 refills | Status: DC

## 2022-06-28 NOTE — Telephone Encounter (Signed)
Refill Request  Semaglutide,0.25 or 0.'5MG'$ /DOS, 2 MG/3ML SOPN  [facility], [place] AT [address]

## 2022-07-04 ENCOUNTER — Encounter: Payer: Self-pay | Admitting: Physician Assistant

## 2022-07-04 ENCOUNTER — Ambulatory Visit (INDEPENDENT_AMBULATORY_CARE_PROVIDER_SITE_OTHER): Payer: Medicare HMO | Admitting: Physician Assistant

## 2022-07-04 DIAGNOSIS — G4700 Insomnia, unspecified: Secondary | ICD-10-CM | POA: Diagnosis not present

## 2022-07-04 DIAGNOSIS — F411 Generalized anxiety disorder: Secondary | ICD-10-CM | POA: Diagnosis not present

## 2022-07-04 DIAGNOSIS — F3342 Major depressive disorder, recurrent, in full remission: Secondary | ICD-10-CM | POA: Diagnosis not present

## 2022-07-04 MED ORDER — ALPRAZOLAM 1 MG PO TABS: ORAL_TABLET | ORAL | 5 refills | Status: DC

## 2022-07-04 NOTE — Progress Notes (Signed)
[facility]  Patient ID: [name] [name],  MRN: [id number]  PCP: Iona Beard, MD  Date of Evaluation: 07/04/2022 Time spent:20 minutes  Chief Complaint:  Chief Complaint   Depression; Anxiety; Insomnia; Follow-up    HISTORY/CURRENT STATUS: HPI For routine med check.   Feels like meds are working well. Patient is able to enjoy things.  Energy and motivation are good.  No extreme sadness, tearfulness, or feelings of hopelessness.  Sleeps well most of the time. ADLs and personal hygiene are normal.   Denies any changes in concentration, making decisions, or remembering things.  Appetite has decreased since being on Ozempic. Has lost about 30 # in 3 months.  Denies suicidal or homicidal thoughts.  Is stressed b/c her landlord sold the house and now they may have to move or maybe pay a lot more rent. Not sure what they'll have to do. The [facility] went out this morning. And the fridge is leaking. "What else is going to happen?"  Anxiety is about the same, Xanax helps.  Sleeps well with the trazodone.  Patient denies increased energy with decreased need for sleep, increased talkativeness, racing thoughts, impulsivity or risky behaviors, increased spending, increased libido, grandiosity, increased irritability or anger, paranoia, and hallucinations.  Denies dizziness, syncope, seizures, numbness, tingling, tremor, tics, unsteady gait, slurred speech, confusion. Denies muscle or joint pain, stiffness, or dystonia.  Individual Medical History/ Review of Systems: Changes? :Yes   started on Ozempic  Past medications for mental health diagnoses include: Wellbutrin, Xanax, Zoloft, trazodone, gabapentin, BuSpar, Ativan, Klonopin  Allergies: Ibuprofen and Clonazepam  Current Medications:  Current Outpatient Medications:    acetaminophen (TYLENOL) 500 MG tablet, Take 1,000 mg by mouth every 6 (six) hours as needed (for pain OR back pain). , Disp: , Rfl:    albuterol (VENTOLIN HFA)  108 (90 Base) MCG/ACT inhaler, Inhale 2 puffs into the lungs every 6 (six) hours as needed for wheezing or shortness of breath., Disp: 8 g, Rfl: 5   atorvastatin (LIPITOR) 10 MG tablet, TAKE 1 TABLET(10 MG) BY MOUTH DAILY, Disp: 90 tablet, Rfl: 3   famotidine (PEPCID) 10 MG tablet, Take 1 tablet (10 mg total) by mouth 2 (two) times daily. May increase to 2 pills twice a day if no improvement after 2 weeks, Disp: 60 tablet, Rfl: 3   fluticasone furoate-vilanterol (BREO ELLIPTA) 100-25 MCG/INH AEPB, Inhale 1 puff into the lungs daily., Disp: 28 each, Rfl: 5   gabapentin (NEURONTIN) 100 MG capsule, TAKE 1 CAPSULE(100 MG) BY MOUTH THREE TIMES DAILY, Disp: 90 capsule, Rfl: 3   lidocaine (HM LIDOCAINE PATCH) 4 %, Place 1 patch onto the skin daily. Apply patch for 12 hours.  Remove for 12 hours before replacing.  Use only 1 patch per 24 hours., Disp: 14 patch, Rfl: 0   pantoprazole (PROTONIX) 20 MG tablet, Take 1 tablet (20 mg total) by mouth daily., Disp: 90 tablet, Rfl: 1   Semaglutide,0.25 or 0.'5MG'$ /DOS, 2 MG/3ML SOPN, Inject 0.5 mg into the skin once a week., Disp: 3 mL, Rfl: 0   sertraline (ZOLOFT) 100 MG tablet, TAKE 3 TABLETS(300 MG) BY MOUTH DAILY, Disp: 270 tablet, Rfl: 0   traZODone (DESYREL) 150 MG tablet, Take 1 tablet (150 mg total) by mouth at bedtime., Disp: 90 tablet, Rfl: 1   ALPRAZolam (XANAX) 1 MG tablet, TAKE 1/2 TO 1 TABLET(0.5 TO 1 MG) BY MOUTH THREE TIMES DAILY AS NEEDED FOR ANXIETY, Disp: 75 tablet, Rfl: 5 Medication Side Effects: none  Family Medical/  Social History: Changes? No  MENTAL HEALTH EXAM:  There were no vitals taken for this visit.There is no height or weight on file to calculate BMI.  General Appearance: Casual, Well Groomed, and Obese  Eye Contact:  Good  Speech:  Clear and Coherent and Normal Rate  Volume:  Normal  Mood:  Euthymic  Affect:  Congruent  Thought Process:  Goal Directed and Descriptions of Associations: Circumstantial  Orientation:  Full (Time,  Place, and Person)  Thought Content: Logical   Suicidal Thoughts:  No  Homicidal Thoughts:  No  Memory:  WNL  Judgement:  Good  Insight:  Good  Psychomotor Activity:  Normal and walks w/ 4 prong cane  Concentration:  Concentration: Good and Attention Span: Good  Recall:  Good  Fund of Knowledge: Good  Language: Good  Assets:  Desire for Improvement  ADL's:  Intact  Cognition: WNL  Prognosis:  Good    DIAGNOSES:    ICD-10-CM   1. Generalized anxiety disorder  F41.1     2. Recurrent major depressive disorder, in full remission ([place])  F33.42     3. Insomnia, unspecified type  G47.00        Receiving Psychotherapy: No   RECOMMENDATIONS:  PDMP reviewed.  Xanax filled 06/16/2022.  I provided 20 minutes of face to face time during this encounter, including time spent before and after the visit in records review, medical decision making, counseling pertinent to today's visit, and charting.   She is doing well as far as her medications go so no changes will be made.  Continue Xanax 1 mg, 1/2-1 3 times daily as needed but no more than 2.5 pills daily. Continue Zoloft 100 mg, 3 p.o. daily. Continue trazodone 150 mg, 1 p.o. nightly as needed sleep. Return in 6 months.  Donnal Moat, PA-C

## 2022-07-12 ENCOUNTER — Ambulatory Visit
Admission: RE | Admit: 2022-07-12 | Discharge: 2022-07-12 | Disposition: A | Discharge status: Home / Self Care | Payer: Medicare HMO | Source: Ambulatory Visit | Attending: Family Medicine | Admitting: Family Medicine

## 2022-07-12 DIAGNOSIS — Z139 Encounter for screening, unspecified: Secondary | ICD-10-CM

## 2022-07-12 DIAGNOSIS — Z1231 Encounter for screening mammogram for malignant neoplasm of breast: Secondary | ICD-10-CM | POA: Diagnosis not present

## 2022-07-15 ENCOUNTER — Other Ambulatory Visit: Payer: Self-pay | Admitting: Podiatry

## 2022-08-01 ENCOUNTER — Ambulatory Visit (INDEPENDENT_AMBULATORY_CARE_PROVIDER_SITE_OTHER): Payer: Medicare HMO | Admitting: Student

## 2022-08-01 ENCOUNTER — Encounter: Payer: Self-pay | Admitting: Student

## 2022-08-01 VITALS — BP 118/74 | HR 82 | Temp 97.7°F | Ht 68.0 in | Wt 262.8 lb

## 2022-08-01 DIAGNOSIS — R7303 Prediabetes: Secondary | ICD-10-CM | POA: Insufficient documentation

## 2022-08-01 DIAGNOSIS — I700 Atherosclerosis of aorta: Secondary | ICD-10-CM | POA: Diagnosis not present

## 2022-08-01 DIAGNOSIS — Z7985 Long-term (current) use of injectable non-insulin antidiabetic drugs: Secondary | ICD-10-CM

## 2022-08-01 DIAGNOSIS — Z6839 Body mass index (BMI) 39.0-39.9, adult: Secondary | ICD-10-CM

## 2022-08-01 DIAGNOSIS — F17210 Nicotine dependence, cigarettes, uncomplicated: Secondary | ICD-10-CM | POA: Diagnosis not present

## 2022-08-01 DIAGNOSIS — Z0000 Encounter for general adult medical examination without abnormal findings: Secondary | ICD-10-CM

## 2022-08-01 DIAGNOSIS — F17200 Nicotine dependence, unspecified, uncomplicated: Secondary | ICD-10-CM

## 2022-08-01 DIAGNOSIS — E119 Type 2 diabetes mellitus without complications: Secondary | ICD-10-CM | POA: Insufficient documentation

## 2022-08-01 MED ORDER — SEMAGLUTIDE (1 MG/DOSE) 4 MG/3ML ~~LOC~~ SOPN: 1.0000 mg | PEN_INJECTOR | SUBCUTANEOUS | 3 refills | Status: DC

## 2022-08-01 MED ORDER — VARENICLINE TARTRATE (STARTER) 0.5 MG X 11 & 1 MG X 42 PO TBPK: ORAL_TABLET | ORAL | 0 refills | Status: AC

## 2022-08-01 MED ORDER — VARENICLINE TARTRATE 1 MG PO TABS: 1.0000 mg | ORAL_TABLET | Freq: Two times a day (BID) | ORAL | 1 refills | Status: DC

## 2022-08-01 NOTE — Patient Instructions (Addendum)
It was a pleasure seeing you in clinic today  Obesity You have lost 60 lbs since 2019 and 10 lbs in the last month. Keep up the good work Please increase Ozempic to 1 mg weekly I will send in the letter to the surgical clinic to consider lap banding  Smoking It is great that you are trying to stop smoking Please start Chantix to help with this  Set a quit date and start taking chantix 1 week before Please take 0.5 mg daily for 3 days then, Increase to 0.5 mg twice daily for 4 days, Then increase to '1mg'$  twice daily, you will continue this for 11 weeks  We will check you lipid and a1c today and I will call with your results   Follow up in 1 month

## 2022-08-02 NOTE — Assessment & Plan Note (Signed)
Continues to have good weight loss on Ozempic 0.5 mg weekly.  No side effects with increasing Ozempic since last visit.  She is very happy and optimistic about her weight changes.  We will increase her Ozempic to 1 mg weekly.  She is interested in lap band surgery through [facility] surgery.  She recently attended seminar and needs letter of medical necessity from our office.  We had a discussion given that she has good weight loss on medical management she may not need bariatric surgery.  She remains very interested in bariatric surgery for further weight loss.  I filled out a letter of medical necessity and sent this to her surgical clinic.  Increase Ozempic to 1 mg weekly Follow-up in 4 weeks.

## 2022-08-02 NOTE — Assessment & Plan Note (Signed)
Declines vaccines and DXA scan today would like to discuss that at next visit.

## 2022-08-02 NOTE — Progress Notes (Signed)
Established Patient Office Visit  Subjective   Patient ID: [name] [name], female    DOB: [birth year]  Age: 66 y.o. MRN: [id number]  Chief Complaint  Patient presents with   Obesity    Weight loss management Discuss bariatric options       [name] [name] is a 66 year old person living with history of below who presents today for follow-up of obesity as well as for medical management of smoking which she recently restarted.    Patient Active Problem List   Diagnosis Date Noted   Prediabetes 08/01/2022   Pneumonia due to COVID-19 virus 12/02/2020   COVID-19 with pulmonary comorbidity 12/02/2020   Cellulitis of finger of left hand 08/08/2020   Acute on chronic respiratory failure with hypoxemia ([place]) 06/30/2020   Fall from ground level 06/16/2020   Posterior right knee pain 02/06/2019   Disequilibrium 02/06/2019   OCD (obsessive compulsive disorder) 08/13/2018   Aortic atherosclerosis ([place]) 08/17/2016   Rash and nonspecific skin eruption 12/17/2015   Periodic limb movement disorder 08/14/2015   Obesity hypoventilation syndrome ([place]) 01/21/2015   Health care maintenance 07/01/2014   Asthma 05/28/2013   OSA (obstructive sleep apnea) 04/04/2013   Anxiety disorder 07/02/2012   Depression 06/28/2012   Morbid obesity ([place]) 03/02/2012   GERD (gastroesophageal reflux disease) 02/05/2012   Smoking 02/05/2012   ROS: Negative as per HPI     Objective:     BP 118/74 (BP Location: Left Arm, Patient Position: Sitting, Cuff Size: Normal)   Pulse 82   Temp 97.7 F (36.5 C) (Oral)   Ht '5\' 8"'$  (1.727 m)   Wt 262 lb 12.8 oz (119.2 kg)   SpO2 94% Comment: room air at rest  BMI 39.96 kg/m     Physical Exam Constitutional:      General: She is not in acute distress.    Appearance: Normal appearance. She is obese.  HENT:     Head: Normocephalic and atraumatic.  Eyes:     Extraocular Movements: Extraocular movements intact.     Pupils: Pupils are equal, round, and  reactive to light.  Cardiovascular:     Rate and Rhythm: Normal rate and regular rhythm.     Heart sounds: No murmur heard. Pulmonary:     Effort: Pulmonary effort is normal. No respiratory distress.     Breath sounds: Normal breath sounds. No wheezing or rhonchi.  Abdominal:     General: Abdomen is flat. Bowel sounds are normal. There is no distension.     Palpations: Abdomen is soft.     Tenderness: There is no abdominal tenderness.  Musculoskeletal:        General: No swelling.  Skin:    General: Skin is warm and dry.     Findings: No lesion or rash.  Neurological:     Mental Status: She is alert.  Psychiatric:        Mood and Affect: Mood normal.        Behavior: Behavior normal.     Results for orders placed or performed in visit on 08/01/22  Lipid Profile  Result Value Ref Range   Cholesterol, Total WILL FOLLOW    Triglycerides WILL FOLLOW    HDL WILL FOLLOW    VLDL Cholesterol Cal WILL FOLLOW    LDL Chol Calc (NIH) WILL FOLLOW    Lipid Comment: WILL FOLLOW    Chol/HDL Ratio WILL FOLLOW   Hemoglobin A1c  Result Value Ref Range   Hgb A1c MFr Bld 5.7 (H)  4.8 - 5.6 %   Est. average glucose Bld gHb Est-mCnc 117 mg/dL    Last hemoglobin A1c Lab Results  Component Value Date   HGBA1C 5.7 (H) 08/01/2022    The ASCVD Risk score (Arnett DK, et al., 2019) failed to calculate for the following reasons:   The valid HDL cholesterol range is 0.517 to 2.586 mmol/L   The valid total cholesterol range is 3.362 to 8.275 mmol/L    Assessment & Plan:   Problem List Items Addressed This Visit       Cardiovascular and Mediastinum   Aortic atherosclerosis ([place]) - Primary (Chronic)    Repeat lipid panel today.      Relevant Orders   Lipid Profile (Completed)     Other   Morbid obesity ([place]) (Chronic)    Continues to have good weight loss on Ozempic 0.5 mg weekly.  No side effects with increasing Ozempic since last visit.  She is very happy and optimistic about her  weight changes.  We will increase her Ozempic to 1 mg weekly.  She is interested in lap band surgery through [facility] surgery.  She recently attended seminar and needs letter of medical necessity from our office.  We had a discussion given that she has good weight loss on medical management she may not need bariatric surgery.  She remains very interested in bariatric surgery for further weight loss.  I filled out a letter of medical necessity and sent this to her surgical clinic.  Increase Ozempic to 1 mg weekly Follow-up in 4 weeks.      Relevant Medications   Semaglutide, 1 MG/DOSE, 4 MG/3ML SOPN   Health care maintenance (Chronic)    Declines vaccines and DXA scan today would like to discuss that at next visit.       Smoking    Previously quit about 6 years ago. Started to smoke about 3 months ago. Now smoking about 1 ppd. Thinks she used chantix/zyban in the past but cannot remember which. Did not feel nicotine replacement helped. Plans to set a quit date soon and start chantix. Is very determined to quit again.  - Start chantix - follow up in 1 month      Prediabetes    Last A1c 5.7 in 11/2020. Repeat A1c today. This may have improved on ozempic.      Relevant Orders   Hemoglobin A1c (Completed)    Return in about 1 month (around 08/31/2022) for Obseity, smoking cessation.    Iona Beard, MD

## 2022-08-02 NOTE — Assessment & Plan Note (Signed)
Last A1c 5.7 in 11/2020. Repeat A1c today. This may have improved on ozempic.

## 2022-08-02 NOTE — Assessment & Plan Note (Signed)
Previously quit about 6 years ago. Started to smoke about 3 months ago. Now smoking about 1 ppd. Thinks she used chantix/zyban in the past but cannot remember which. Did not feel nicotine replacement helped. Plans to set a quit date soon and start chantix. Is very determined to quit again.  - Start chantix - follow up in 1 month

## 2022-08-02 NOTE — Assessment & Plan Note (Signed)
Repeat lipid panel today. 

## 2022-08-03 LAB — LIPID PANEL
Chol/HDL Ratio: 3.6 ratio (ref 0.0–4.4)
Cholesterol, Total: 186 mg/dL (ref 100–199)
HDL: 51 mg/dL (ref >39)
LDL Chol Calc (NIH): 107 mg/dL — ABNORMAL HIGH (ref 0–99)
Triglycerides: 161 mg/dL — ABNORMAL HIGH (ref 0–149)
VLDL Cholesterol Cal: 28 mg/dL (ref 5–40)

## 2022-08-03 LAB — HEMOGLOBIN A1C
Est. average glucose Bld gHb Est-mCnc: 117 mg/dL
Hgb A1c MFr Bld: 5.7 % — ABNORMAL HIGH (ref 4.8–5.6)

## 2022-08-09 DIAGNOSIS — H25013 Cortical age-related cataract, bilateral: Secondary | ICD-10-CM | POA: Diagnosis not present

## 2022-08-09 DIAGNOSIS — H18413 Arcus senilis, bilateral: Secondary | ICD-10-CM | POA: Diagnosis not present

## 2022-08-09 DIAGNOSIS — H2513 Age-related nuclear cataract, bilateral: Secondary | ICD-10-CM | POA: Diagnosis not present

## 2022-08-09 DIAGNOSIS — H2512 Age-related nuclear cataract, left eye: Secondary | ICD-10-CM | POA: Diagnosis not present

## 2022-08-09 DIAGNOSIS — H25043 Posterior subcapsular polar age-related cataract, bilateral: Secondary | ICD-10-CM | POA: Diagnosis not present

## 2022-08-09 NOTE — Progress Notes (Signed)
Internal Medicine Clinic Attending ? ?Case discussed with Dr. Liang  At the time of the visit.  We reviewed the resident?s history and exam and pertinent patient test results.  I agree with the assessment, diagnosis, and plan of care documented in the resident?s note. ? ?

## 2022-08-30 ENCOUNTER — Ambulatory Visit (INDEPENDENT_AMBULATORY_CARE_PROVIDER_SITE_OTHER): Payer: Medicare HMO | Admitting: Student

## 2022-08-30 VITALS — BP 127/69 | HR 86 | Temp 98.4°F | Wt 254.5 lb

## 2022-08-30 DIAGNOSIS — M8580 Other specified disorders of bone density and structure, unspecified site: Secondary | ICD-10-CM | POA: Diagnosis not present

## 2022-08-30 DIAGNOSIS — F17200 Nicotine dependence, unspecified, uncomplicated: Secondary | ICD-10-CM

## 2022-08-30 DIAGNOSIS — K219 Gastro-esophageal reflux disease without esophagitis: Secondary | ICD-10-CM

## 2022-08-30 DIAGNOSIS — F17210 Nicotine dependence, cigarettes, uncomplicated: Secondary | ICD-10-CM

## 2022-08-30 DIAGNOSIS — Z0000 Encounter for general adult medical examination without abnormal findings: Secondary | ICD-10-CM

## 2022-08-30 MED ORDER — PANTOPRAZOLE SODIUM 40 MG PO TBEC: 40.0000 mg | DELAYED_RELEASE_TABLET | Freq: Every day | ORAL | 2 refills | Status: DC

## 2022-08-30 NOTE — Patient Instructions (Addendum)
Tobacco cessation Stop smoking and continue chantix  Osteopenia We will check a calcium and vitamin d levels  Indigestion Increase protonix to 40 mg daily Continue pepcid If symptoms do not improve we can discuss endoscopy for further work up  We weill plan to do the flu and pneumonia vaccine at your next visit  Follow up in 2 moths

## 2022-08-31 ENCOUNTER — Telehealth: Payer: Self-pay | Admitting: *Deleted

## 2022-08-31 LAB — VITAMIN D 25 HYDROXY (VIT D DEFICIENCY, FRACTURES): Vit D, 25-Hydroxy: 20.1 ng/mL — ABNORMAL LOW (ref 30.0–100.0)

## 2022-08-31 LAB — CALCIUM: Calcium: 9.6 mg/dL (ref 8.7–10.3)

## 2022-08-31 MED ORDER — CHOLECALCIFEROL 20 MCG (800 UNIT) PO TABS: 1.0000 | ORAL_TABLET | Freq: Every day | ORAL | 3 refills | Status: DC

## 2022-08-31 NOTE — Telephone Encounter (Signed)
Patient called in stating pharmacy needs a new Rx for Ozempic. 12 mL with 3 refills sent 9/25. Spoke with Curt Bears at [facility]. States patient has refills and she will get one ready for patient now. Patient notified and is very Patent attorney.

## 2022-09-13 ENCOUNTER — Other Ambulatory Visit: Payer: Self-pay | Admitting: Physician Assistant

## 2022-09-15 ENCOUNTER — Other Ambulatory Visit: Payer: Self-pay | Admitting: Physician Assistant

## 2022-09-16 DIAGNOSIS — M8580 Other specified disorders of bone density and structure, unspecified site: Secondary | ICD-10-CM | POA: Insufficient documentation

## 2022-09-16 NOTE — Progress Notes (Signed)
Established Patient Office Visit  Subjective   Patient ID: [name] [name], female    DOB: 10-18-1956  Age: 66 y.o. MRN: [id number]  Chief Complaint  Patient presents with   Weight Check    [name] [name] is a 66 year old person who presents today for follow-up. Please refer to problem based charting for further details and assessment and plan of current problem and chronic medical conditions.     Patient Active Problem List   Diagnosis Date Noted   Osteopenia 09/16/2022   Prediabetes 08/01/2022   Posterior right knee pain 02/06/2019   Disequilibrium 02/06/2019   OCD (obsessive compulsive disorder) 08/13/2018   Aortic atherosclerosis ([place]) 08/17/2016   Rash and nonspecific skin eruption 12/17/2015   Periodic limb movement disorder 08/14/2015   Obesity hypoventilation syndrome ([place]) 01/21/2015   Health care maintenance 07/01/2014   Asthma 05/28/2013   OSA (obstructive sleep apnea) 04/04/2013   Anxiety disorder 07/02/2012   Depression 06/28/2012   Morbid obesity ([place]) 03/02/2012   GERD (gastroesophageal reflux disease) 02/05/2012   Smoking 02/05/2012     ROS: Negative as per HPI     Objective:     BP 127/69 (BP Location: Right Arm, Patient Position: Sitting, Cuff Size: Normal)   Pulse 86   Temp 98.4 F (36.9 C) (Oral)   Wt 254 lb 8 oz (115.4 kg)   SpO2 94%   BMI 38.70 kg/m  BP Readings from Last 3 Encounters:  08/30/22 127/69  08/01/22 118/74  06/20/22 116/73      Physical Exam Constitutional:      Appearance: Normal appearance. She is obese.  HENT:     Head: Normocephalic and atraumatic.  Eyes:     Extraocular Movements: Extraocular movements intact.     Conjunctiva/sclera: Conjunctivae normal.     Pupils: Pupils are equal, round, and reactive to light.  Cardiovascular:     Rate and Rhythm: Normal rate and regular rhythm.  Pulmonary:     Effort: Pulmonary effort is normal.     Breath sounds: Normal breath sounds. No rhonchi or rales.   Abdominal:     General: Abdomen is flat. Bowel sounds are normal.     Palpations: Abdomen is soft. There is no mass.     Tenderness: There is no abdominal tenderness.  Musculoskeletal:     Right lower leg: No edema.     Left lower leg: No edema.  Skin:    General: Skin is warm and dry.  Neurological:     General: No focal deficit present.     Mental Status: She is alert and oriented to person, place, and time.  Psychiatric:        Mood and Affect: Mood normal.        Behavior: Behavior normal.      Results for orders placed or performed in visit on 08/30/22  Calcium  Result Value Ref Range   Calcium 9.6 8.7 - 10.3 mg/dL  Vitamin D (25 hydroxy)  Result Value Ref Range   Vit D, 25-Hydroxy 20.1 (L) 30.0 - 100.0 ng/mL       The 10-year ASCVD risk score (Arnett DK, et al., 2019) is: 6.1%    Assessment & Plan:   Problem List Items Addressed This Visit       Digestive   GERD (gastroesophageal reflux disease) (Chronic)    Still has dyspepsia occasionally on pepcid and protonix.  This is worse with spicy foods.  No obvious risk factors for H. pylori.  Has been  having weight loss but this is due to starting Ozempic.  Discussed H. pylori and EGD to further evaluate this.  She would like to try symptom management for now.  Increase Protonix to 40 mg daily.  Discussed if this improves her symptoms if still having significant reflux would recommend further evaluation with EGD.      Relevant Medications   pantoprazole (PROTONIX) 40 MG tablet     Musculoskeletal and Integument   Osteopenia - Primary    Bone density scan on 09/23/2021.  T score of left femur -2.2 consistent with osteopenia.  We will check vitamin D and calcium today.      Relevant Orders   Calcium (Completed)   Vitamin D (25 hydroxy) (Completed)     Other   Health care maintenance (Chronic)    She plans to get her pneumonia and flu vaccines at her next visit.      Smoking    Recently started chantix.  Still smoking. Discussed setting a quit day while still on chantix.        Return in about 2 months (around 10/30/2022).    [name] Beard, MD

## 2022-09-16 NOTE — Addendum Note (Signed)
Addended by: Iona Beard on: 09/16/2022 08:45 PM   Modules accepted: Level of Service

## 2022-09-16 NOTE — Assessment & Plan Note (Signed)
Recently started chantix. Still smoking. Discussed setting a quit day while still on chantix.

## 2022-09-16 NOTE — Assessment & Plan Note (Signed)
Still has dyspepsia occasionally on pepcid and protonix.  This is worse with spicy foods.  No obvious risk factors for H. pylori.  Has been having weight loss but this is due to starting Ozempic.  Discussed H. pylori and EGD to further evaluate this.  She would like to try symptom management for now.  Increase Protonix to 40 mg daily.  Discussed if this improves her symptoms if still having significant reflux would recommend further evaluation with EGD.

## 2022-09-16 NOTE — Assessment & Plan Note (Signed)
She plans to get her pneumonia and flu vaccines at her next visit.

## 2022-09-16 NOTE — Assessment & Plan Note (Signed)
Bone density scan on 09/23/2021.  T score of left femur -2.2 consistent with osteopenia.  We will check vitamin D and calcium today.

## 2022-09-26 NOTE — Progress Notes (Signed)
Internal Medicine Clinic Attending ? ?Case discussed with Dr. Liang  At the time of the visit.  We reviewed the resident?s history and exam and pertinent patient test results.  I agree with the assessment, diagnosis, and plan of care documented in the resident?s note. ? ?

## 2022-10-03 ENCOUNTER — Encounter: Payer: Self-pay | Admitting: Student

## 2022-10-03 ENCOUNTER — Ambulatory Visit (INDEPENDENT_AMBULATORY_CARE_PROVIDER_SITE_OTHER): Payer: Medicare HMO | Admitting: Student

## 2022-10-03 VITALS — BP 124/66 | HR 77 | Temp 98.4°F | Wt 249.6 lb

## 2022-10-03 DIAGNOSIS — F17210 Nicotine dependence, cigarettes, uncomplicated: Secondary | ICD-10-CM | POA: Diagnosis not present

## 2022-10-03 DIAGNOSIS — J45909 Unspecified asthma, uncomplicated: Secondary | ICD-10-CM

## 2022-10-03 DIAGNOSIS — Z23 Encounter for immunization: Secondary | ICD-10-CM

## 2022-10-03 DIAGNOSIS — F17200 Nicotine dependence, unspecified, uncomplicated: Secondary | ICD-10-CM

## 2022-10-03 DIAGNOSIS — K219 Gastro-esophageal reflux disease without esophagitis: Secondary | ICD-10-CM | POA: Diagnosis not present

## 2022-10-03 NOTE — Patient Instructions (Signed)
Weight loss You are doing great with weight loss. Continue Ozempic 1 mg weekly. Call the clinic if you would like [place] to change your pharmacy.  Tobacco use Great job on cutting down. Set a quit date and try to see how things are going with just chantix for the next month. If it goes well we can stop the chantix in about 1 month. Otherwise if you are unable to quit we may need to extend your course  Reflux I will make a referral to GI as medication does not seem to be helping anymore   Follow up 1-2 months

## 2022-10-04 ENCOUNTER — Encounter: Payer: Self-pay | Admitting: Student

## 2022-10-04 NOTE — Progress Notes (Signed)
Established Patient Office Visit  Subjective   Patient ID: [name] [name], female    DOB: 21-Oct-1956  Age: 66 y.o. MRN: [id number]  Chief Complaint  Patient presents with   routine follow up    [name] [name] is a 66 y.o. person living with a history listed below who presents to clinic for follow up of obesity and smoking cessation. Please refer to problem based charting for further details and assessment and plan of current problem and chronic medical conditions.       Patient Active Problem List   Diagnosis Date Noted   Osteopenia 09/16/2022   Prediabetes 08/01/2022   Posterior right knee pain 02/06/2019   Disequilibrium 02/06/2019   OCD (obsessive compulsive disorder) 08/13/2018   Aortic atherosclerosis ([place]) 08/17/2016   Rash and nonspecific skin eruption 12/17/2015   Periodic limb movement disorder 08/14/2015   Obesity hypoventilation syndrome ([place]) 01/21/2015   Health care maintenance 07/01/2014   Asthma 05/28/2013   OSA (obstructive sleep apnea) 04/04/2013   Anxiety disorder 07/02/2012   Depression 06/28/2012   Morbid obesity ([place]) 03/02/2012   GERD (gastroesophageal reflux disease) 02/05/2012   Smoking 02/05/2012      ROS    Objective:     BP 124/66 (BP Location: Left Arm, Patient Position: Sitting, Cuff Size: Normal)   Pulse 77   Temp 98.4 F (36.9 C) (Oral)   Wt 249 lb 9.6 oz (113.2 kg)   SpO2 94% Comment: room air at rest  BMI 37.95 kg/m  BP Readings from Last 3 Encounters:  10/03/22 124/66  08/30/22 127/69  08/01/22 118/74      Physical Exam Constitutional:      Appearance: Normal appearance. She is obese.  HENT:     Mouth/Throat:     Mouth: Mucous membranes are moist.     Pharynx: Oropharynx is clear.  Eyes:     Conjunctiva/sclera: Conjunctivae normal.     Pupils: Pupils are equal, round, and reactive to light.  Cardiovascular:     Rate and Rhythm: Normal rate and regular rhythm.  Pulmonary:     Effort: Pulmonary  effort is normal. No respiratory distress.     Breath sounds: No wheezing, rhonchi or rales.  Abdominal:     General: Abdomen is flat. Bowel sounds are normal. There is no distension.     Palpations: Abdomen is soft.     Tenderness: There is no abdominal tenderness.  Musculoskeletal:        General: Normal range of motion.     Right lower leg: No edema.     Left lower leg: No edema.  Skin:    General: Skin is warm and dry.     Capillary Refill: Capillary refill takes less than 2 seconds.  Neurological:     General: No focal deficit present.     Mental Status: She is alert and oriented to person, place, and time.  Psychiatric:        Mood and Affect: Mood normal.        Behavior: Behavior normal.     No results found for any visits on 10/03/22.   The 10-year ASCVD risk score (Arnett DK, et al., 2019) is: 11.2%    Assessment & Plan:   Problem List Items Addressed This Visit       Digestive   GERD (gastroesophageal reflux disease) - Primary (Chronic)    Still having significant reflux symptoms especially after eating kettle corn despite increasing protonix to 40 mg daily. Is  on ozempic for weight loss but with worsening symptoms despite increasing meds will send her to GI for further evaluation.  Referral to GI      Relevant Orders   Ambulatory referral to Gastroenterology     Other   Morbid obesity ([place]) (Chronic)    Lost about 5 lbs since last visit 1 month ago. Tolerating Ozempic well. Will continue on current dose. She will call if she need to switch pharmacy due to medication backorder issues.      Smoking    No smoking 3 cigarette a day. Chantix is helping her cut down. Encouraged her to set a quit date. Has about 4 weeks left of 12 week course. If she doing well off cigarettes on chantix alone would trial stopping chantix after completing 12 weeks. If unable to stop smoking can also discuss extending course of chantix at next visit.      Other Visit Diagnoses      Need for immunization against influenza       Relevant Orders   Flu Vaccine QUAD 102moIM (Fluarix, Fluzone & Alfiuria Quad PF) (Completed)   Need for pneumococcal vaccination       Relevant Orders   Pneumococcal conjugate vaccine 20-valent (Prevnar 20) (Completed)       Return in about 2 months (around 12/03/2022).    JIona Beard MD

## 2022-10-04 NOTE — Assessment & Plan Note (Signed)
Still having significant reflux symptoms especially after eating kettle corn despite increasing protonix to 40 mg daily. Is on ozempic for weight loss but with worsening symptoms despite increasing meds will send her to GI for further evaluation.  Referral to GI

## 2022-10-04 NOTE — Addendum Note (Signed)
Addended by: Iona Beard on: 10/04/2022 01:47 PM   Modules accepted: Level of Service

## 2022-10-04 NOTE — Assessment & Plan Note (Addendum)
Doing well on albuterol as needed only. No longer using breo. Only using albuterol 1-2 times a month. Breathing feeling much better since she has cut down on smoking and losing weight. Encouraged her to continue to work on tobacco cessation. Will continue to monitor for worsening symptoms.

## 2022-10-04 NOTE — Assessment & Plan Note (Addendum)
Lost about 5 lbs since last visit 1 month ago. Tolerating Ozempic well. Will continue on current dose. She will call if she need to switch pharmacy due to medication backorder issues. No longer as interested in bariatric surgery as she feels she is losing weight well on medical therapy.

## 2022-10-04 NOTE — Assessment & Plan Note (Signed)
No smoking 3 cigarette a day. Chantix is helping her cut down. Encouraged her to set a quit date. Has about 4 weeks left of 12 week course. If she doing well off cigarettes on chantix alone would trial stopping chantix after completing 12 weeks. If unable to stop smoking can also discuss extending course of chantix at next visit.

## 2022-10-05 NOTE — Progress Notes (Signed)
Internal Medicine Clinic Attending ? ?Case discussed with Dr. Liang  At the time of the visit.  We reviewed the resident?s history and exam and pertinent patient test results.  I agree with the assessment, diagnosis, and plan of care documented in the resident?s note. ? ?

## 2022-11-20 IMAGING — DX DG CHEST 1V PORT
1 series · 1 of 1 positions shown · non-contrast
Comparison: 12/01/2020

CLINICAL DATA: COVID symptoms beginning 1 week ago. History of
asthma with increased shortness of breath. Wheezing.

EXAM:
PORTABLE CHEST 1 VIEW

[chest ap]
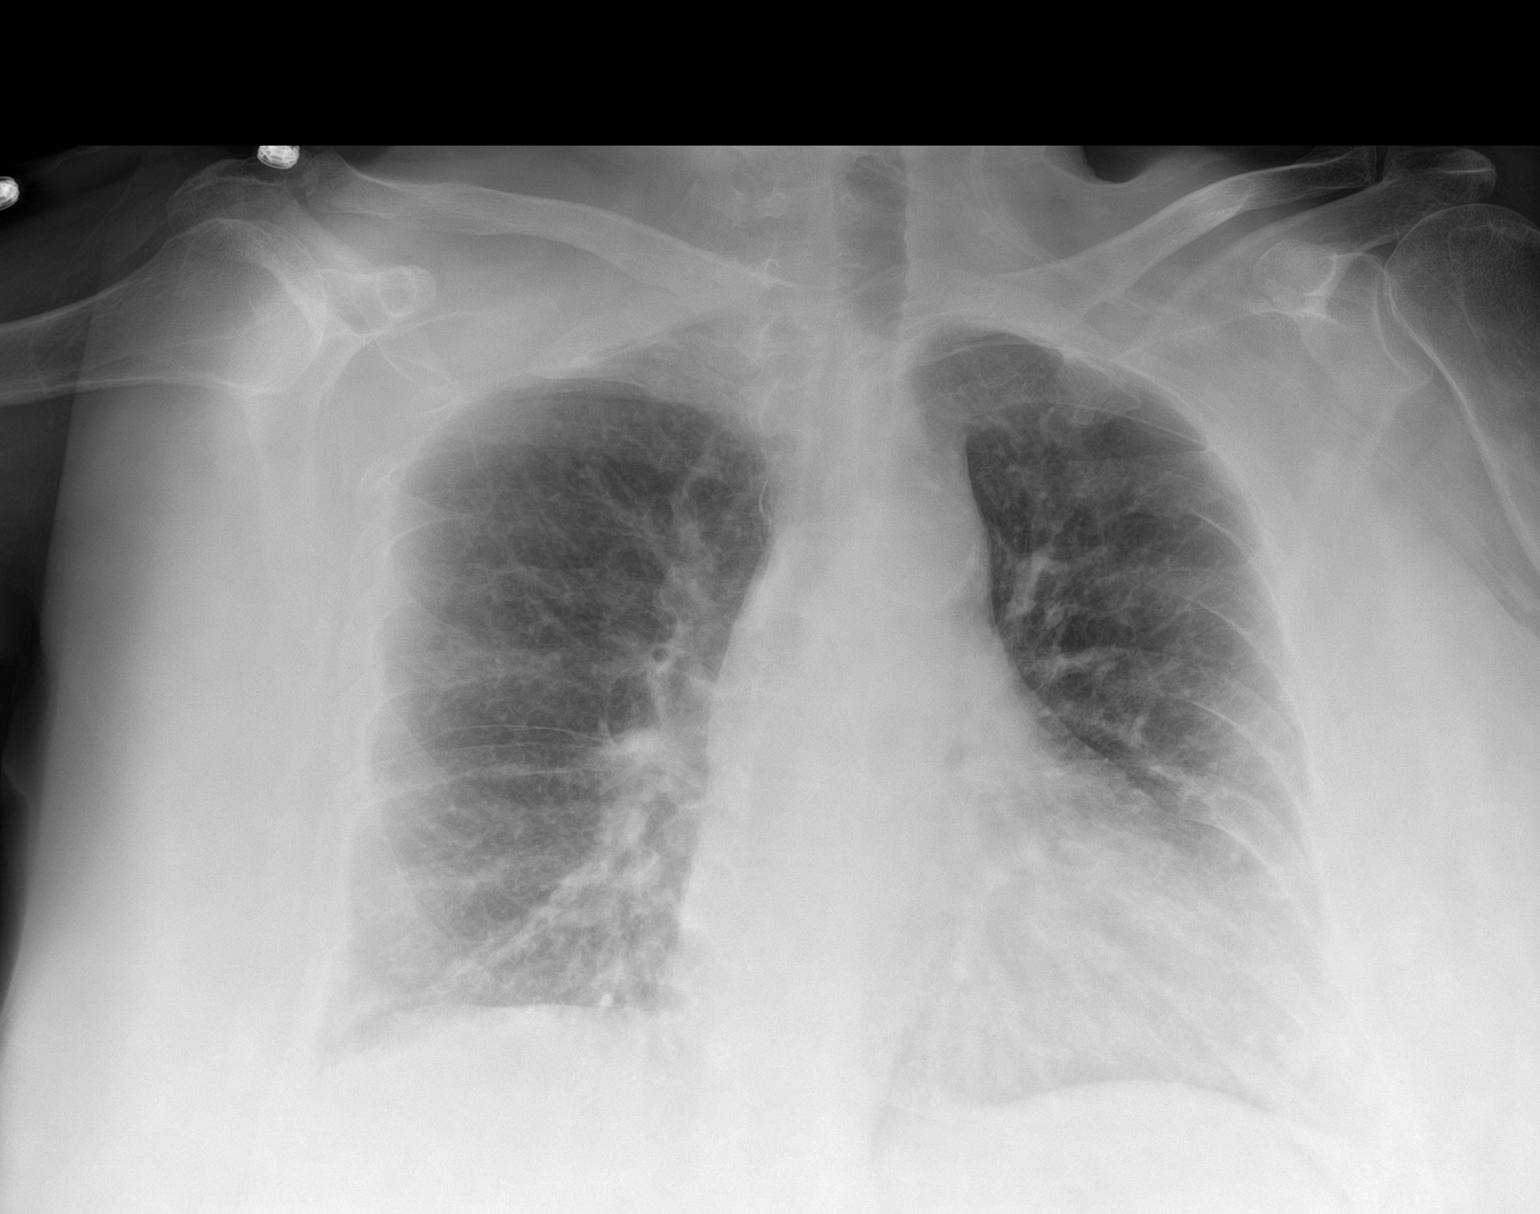

[1 of 1 positions shown; findings below may reference images not displayed]

FINDINGS: Mild cardiac enlargement. Peripheral infiltrates with peribronchial
thickening may represent bronchiolitis or multifocal pneumonia.
Similar appearance to previous study. No pleural effusions. No
pneumothorax. Mediastinal contours appear intact. Calcification of
the aorta.
IMPRESSION: Peripheral infiltrates with peribronchial thickening may represent
bronchiolitis or multifocal pneumonia.

## 2022-11-20 IMAGING — CT CT ANGIO CHEST
3 of 7 series · 18 of 36 positions shown · IV contrast (omnipaque)
Comparison: 08/08/2016

CLINICAL DATA: COVID positive, asthma, progressive dyspnea

EXAM:
CT ANGIOGRAPHY CHEST WITH CONTRAST
TECHNIQUE: Multidetector CT imaging of the chest was performed using the
standard protocol during bolus administration of intravenous
contrast. Multiplanar CT image reconstructions and MIPs were
obtained to evaluate the vascular anatomy.
CONTRAST:  75mL OMNIPAQUE IOHEXOL 350 MG/ML SOLN

[Series 6: pe thins · axial · 0.80mm/px · z∈[+1375,+1620]mm · 15 of 403 slices shown]
[im 26/403  lung]
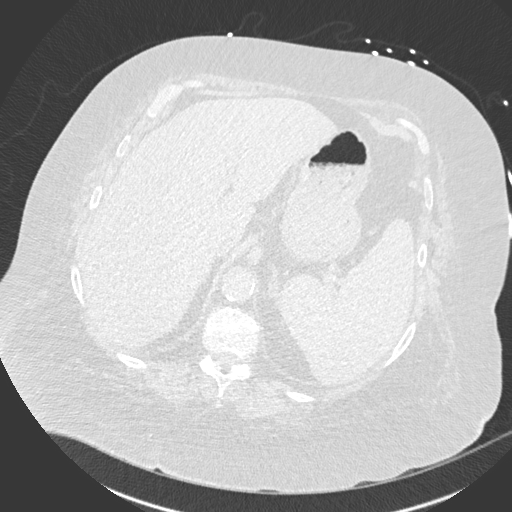
[im 51/403  mediastinal]
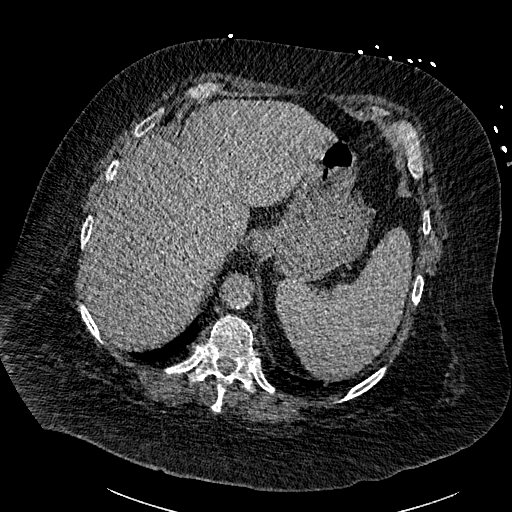
[im 76/403  lung]
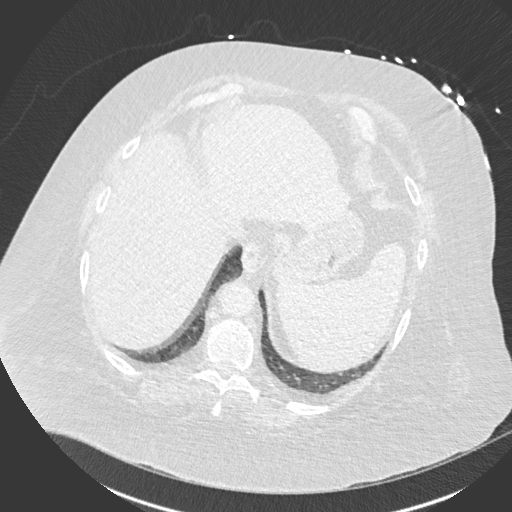
[im 101/403  mediastinal]
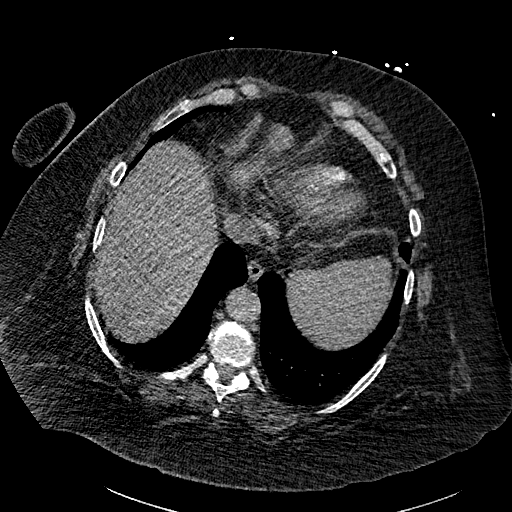
[im 126/403  lung]
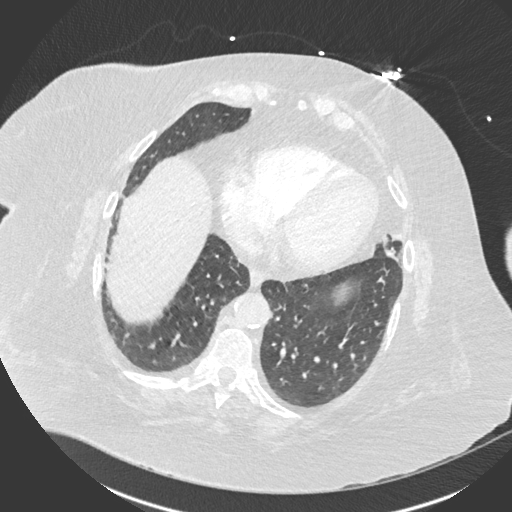
[im 151/403  mediastinal]
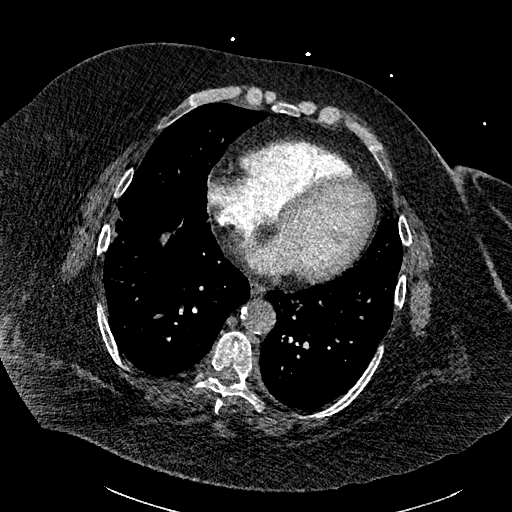
[im 176/403  lung]
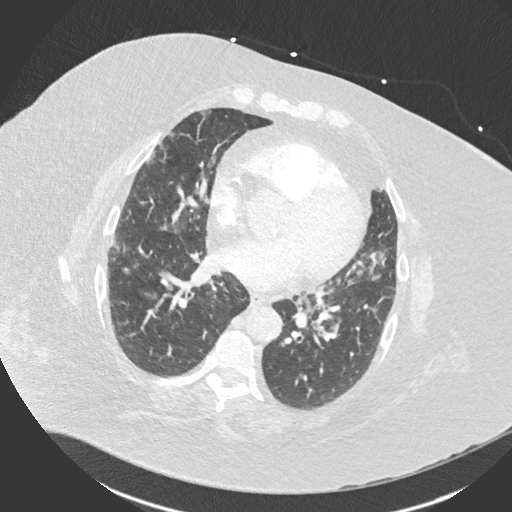
[im 202/403  mediastinal]
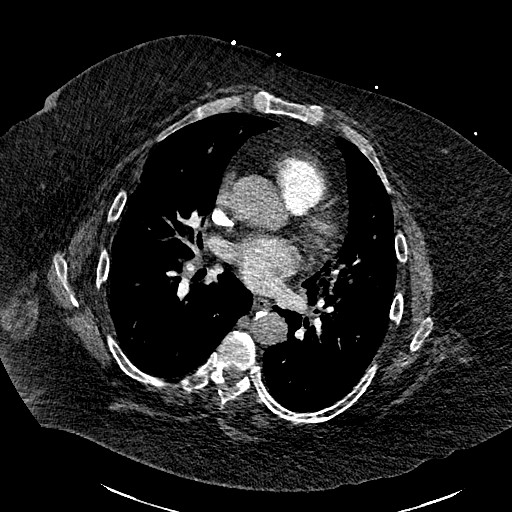
[im 227/403  lung]
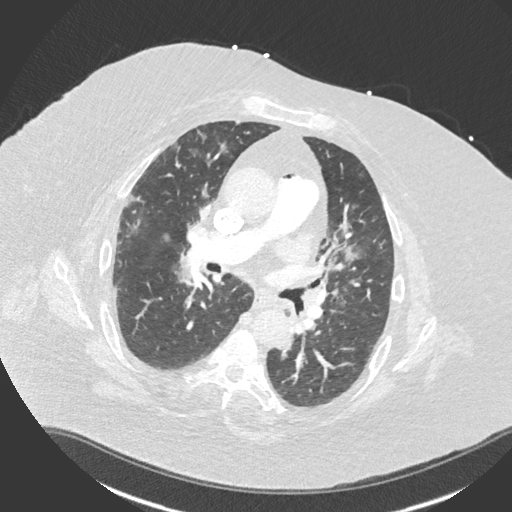
[im 252/403  mediastinal]
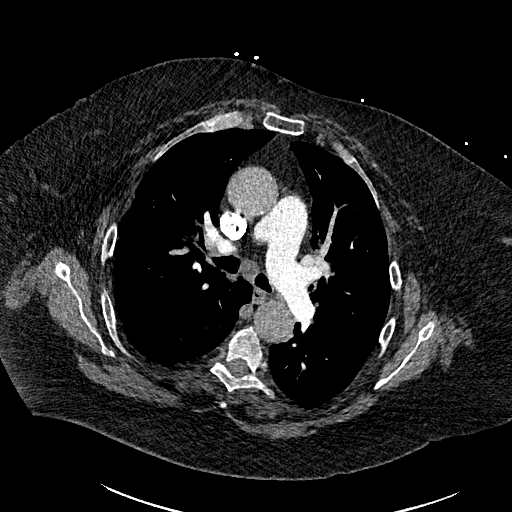
[im 277/403  lung]
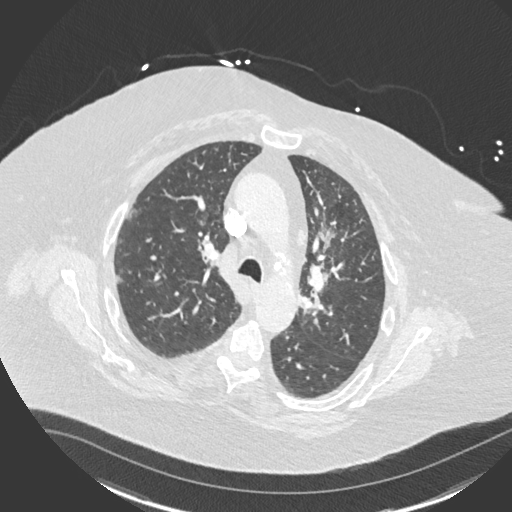
[im 302/403  mediastinal]
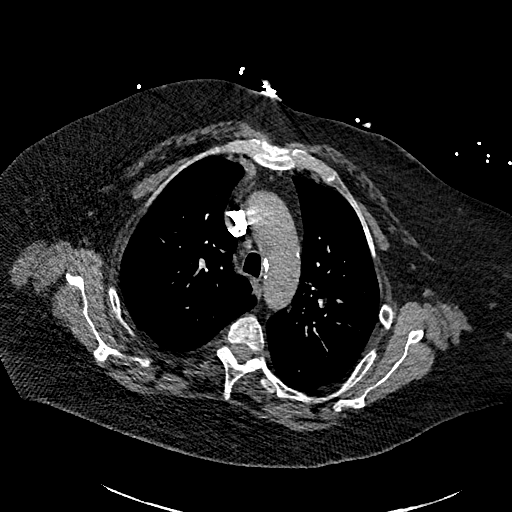
[im 327/403  lung]
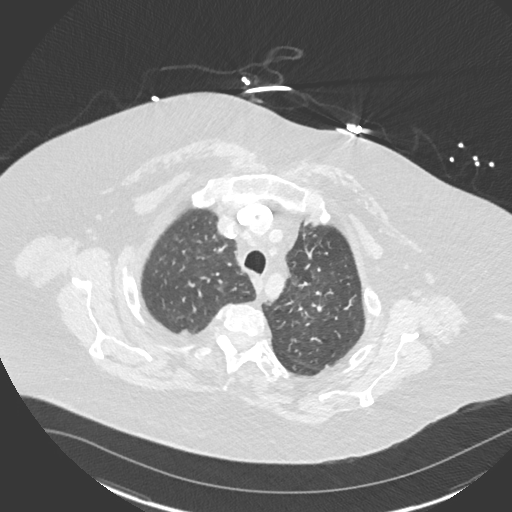
[im 352/403  mediastinal]
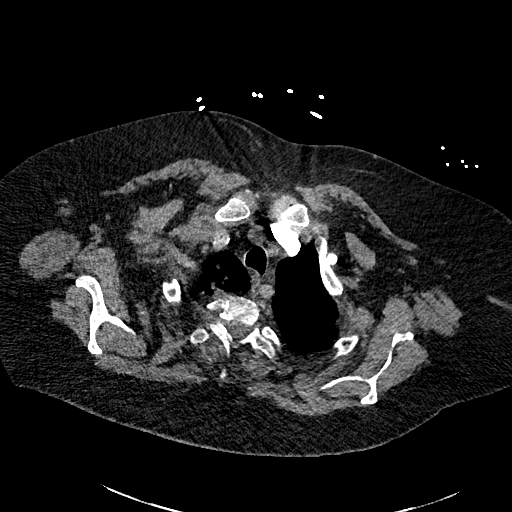
[im 377/403  lung]
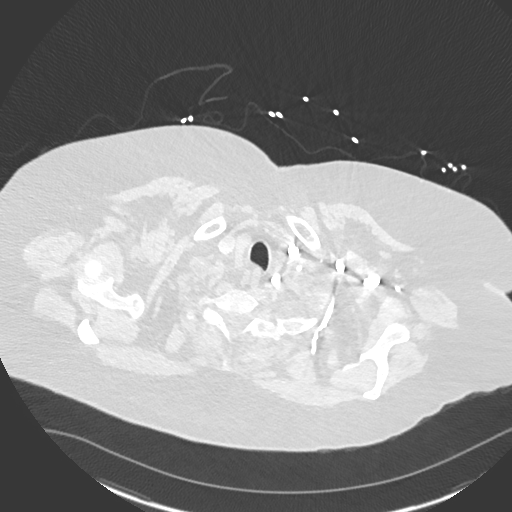

[Series 7: pe lung · axial · 0.80mm/px · z∈[+1423,+1477]mm · 2 of 136 slices shown]
[im 28/136  mediastinal]
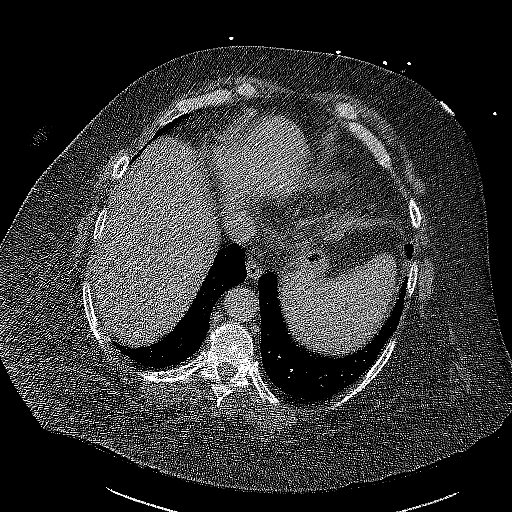
[im 55/136  mediastinal]
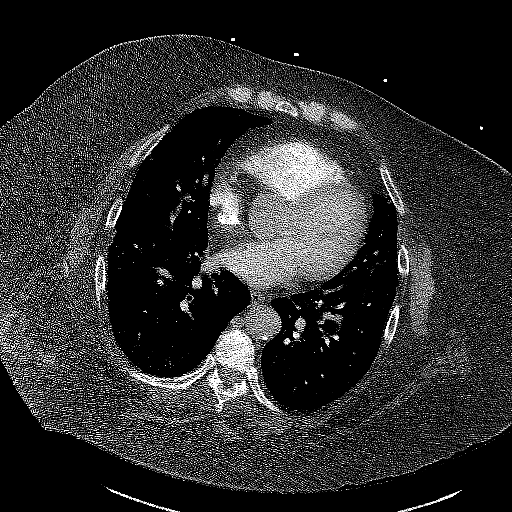

[Series 8: pe 2mm cor · coronal · 0.59mm/px · 1 of 161 slices shown]
[im 81/161  mediastinal]
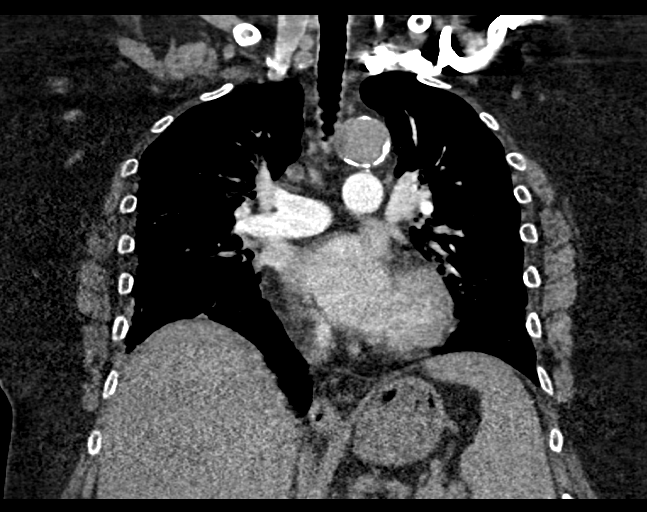

[18 of 36 positions shown; findings below may reference images not displayed]

FINDINGS: Cardiovascular: There is adequate opacification of the pulmonary
arterial tree. No intraluminal filling defect identified to suggest
acute pulmonary embolism. Central pulmonary arteries are of normal
caliber. Mild coronary artery calcification. Global cardiac size
within normal limits. No pericardial effusion. Mild atherosclerotic
calcification within the thoracic aorta. No aortic aneurysm.

Mediastinum/Nodes: Visualized thyroid is unremarkable. No pathologic
thoracic adenopathy. The esophagus is unremarkable.

Lungs/Pleura: The lungs are symmetrically well expanded. There are
mild bilateral perihilar and peripheral diffusely distributed
ground-glass pulmonary infiltrates most in keeping with atypical
infection or inflammation and compatible with given history of
2ZLRW-AL pneumonia. Mild associated bronchial wall thickening in
keeping with airway inflammation. No pneumothorax or pleural
effusion. No central obstructing lesion.

Upper Abdomen: No acute abnormality.

Musculoskeletal: No acute bone abnormality. No lytic or blastic bone
lesion identified.

Review of the MIP images confirms the above findings.
IMPRESSION: No pulmonary embolism.

Mild bilateral pulmonary infiltrates most in keeping with atypical
infection and compatible with the given history of 2ZLRW-AL
pneumonia. Moderate parenchymal involvement.

Mild coronary artery calcification.

Aortic Atherosclerosis ([id number]).

## 2022-11-27 ENCOUNTER — Other Ambulatory Visit: Payer: Self-pay | Admitting: Student

## 2022-11-27 DIAGNOSIS — K219 Gastro-esophageal reflux disease without esophagitis: Secondary | ICD-10-CM

## 2022-11-28 NOTE — Telephone Encounter (Signed)
Patient is due to clinic follow up. Please cause to have her schedule an appointment. Thanks!

## 2022-12-05 ENCOUNTER — Ambulatory Visit (INDEPENDENT_AMBULATORY_CARE_PROVIDER_SITE_OTHER): Payer: Medicare HMO | Admitting: Student

## 2022-12-05 ENCOUNTER — Encounter: Payer: Self-pay | Admitting: Student

## 2022-12-05 DIAGNOSIS — F17200 Nicotine dependence, unspecified, uncomplicated: Secondary | ICD-10-CM

## 2022-12-05 DIAGNOSIS — Z0000 Encounter for general adult medical examination without abnormal findings: Secondary | ICD-10-CM

## 2022-12-05 DIAGNOSIS — R7303 Prediabetes: Secondary | ICD-10-CM | POA: Diagnosis not present

## 2022-12-05 DIAGNOSIS — F17211 Nicotine dependence, cigarettes, in remission: Secondary | ICD-10-CM

## 2022-12-05 MED ORDER — TRULICITY 3 MG/0.5ML ~~LOC~~ SOAJ: 3.0000 mg | SUBCUTANEOUS | 0 refills | Status: DC

## 2022-12-05 NOTE — Patient Instructions (Addendum)
Ms. Ollis,  It was great to see you in clinic today.  Below is what we discussed today:  For your Ozempic coverage - We have modified the medication to Trulicity, which you can pick up at your pharmacy.  Annual Wellness Visit completed today - We are happy to hear you are overall doing well, congratulations on achieving your goal of quitting tobacco use!  Please note the following changes to your medications:  Start Trulicity 3 mg, one injection weekly  It is a pleasure to be a part of your team, thank you for allowing [place] to be a part of your care,  Max and Dr. Allyson Sabal   If you need medication refills please notify your pharmacy one week in advance and they will send [place] a request.

## 2022-12-05 NOTE — Assessment & Plan Note (Signed)
Patient declines zoster vaccine at this time.

## 2022-12-05 NOTE — Progress Notes (Signed)
Subjective:   Patient ID: [name] [name] female   DOB: [birth year] 67 y.o.   MRN: [id number]  HPI: [name] [name] is a 67 y.o. woman with medical history listed below who presents for management of prediabetes. She reports that she has been doing very well and does not have acute concerns today. She has achieved her goal of tobacco use cessation. She also presents for her annual wellness exam today. She lives at home with her husband and reports having a strong support system. Please see problem-based assessment and plan for full details.  Review of Systems: Pertinent items are noted in HPI of problem-based assessment and plan.  Past Medical History:  Diagnosis Date   Anxiety    Asthma    Chronic, with restrictive component 2/2 obesity   Depression    GERD (gastroesophageal reflux disease)    Headache    Insomnia    Obesity    On home oxygen therapy    "3L at night only when I get sick" (04/30/2014)   Shortness of breath dyspnea    Sleep apnea    "couldn't afford mask so I didn't get it" (04/30/2014)    Current Outpatient Medications  Medication Sig Dispense Refill   Dulaglutide (TRULICITY) 3 GY/6.9SW SOPN Inject 3 mg as directed once a week. 6 mL 0   acetaminophen (TYLENOL) 500 MG tablet Take 1,000 mg by mouth every 6 (six) hours as needed (for pain OR back pain).      albuterol (VENTOLIN HFA) 108 (90 Base) MCG/ACT inhaler Inhale 2 puffs into the lungs every 6 (six) hours as needed for wheezing or shortness of breath. 8 g 5   ALPRAZolam (XANAX) 1 MG tablet TAKE 1/2 TO 1 TABLET(0.5 TO 1 MG) BY MOUTH THREE TIMES DAILY AS NEEDED FOR ANXIETY 75 tablet 4   atorvastatin (LIPITOR) 10 MG tablet TAKE 1 TABLET(10 MG) BY MOUTH DAILY 90 tablet 3   Cholecalciferol 20 MCG (800 UNIT) TABS Take 1 tablet by mouth daily. 90 tablet 3   famotidine (PEPCID) 10 MG tablet Take 1 tablet (10 mg total) by mouth 2 (two) times daily. May increase to 2 pills twice a day if no  improvement after 2 weeks 60 tablet 3   fluticasone furoate-vilanterol (BREO ELLIPTA) 100-25 MCG/INH AEPB Inhale 1 puff into the lungs daily. 28 each 5   gabapentin (NEURONTIN) 100 MG capsule TAKE 1 CAPSULE(100 MG) BY MOUTH THREE TIMES DAILY 90 capsule 3   lidocaine (HM LIDOCAINE PATCH) 4 % Place 1 patch onto the skin daily. Apply patch for 12 hours.  Remove for 12 hours before replacing.  Use only 1 patch per 24 hours. 14 patch 0   pantoprazole (PROTONIX) 40 MG tablet TAKE 1 TABLET(40 MG) BY MOUTH DAILY 30 tablet 11   sertraline (ZOLOFT) 100 MG tablet TAKE 3 TABLETS(300 MG) BY MOUTH DAILY 270 tablet 0   traZODone (DESYREL) 150 MG tablet Take 1 tablet (150 mg total) by mouth at bedtime. 90 tablet 1   varenicline (CHANTIX CONTINUING MONTH PAK) 1 MG tablet Take 1 tablet (1 mg total) by mouth 2 (two) times daily. 30 tablet 1   No current facility-administered medications for this visit.     Objective:   Physical Exam: Vitals:   12/05/22 1502  BP: 120/76  Pulse: 80  SpO2: (!) 88%  Weight: 240 lb 3.2 oz (109 kg)    Constitutional: pleasant, well appearing, in no acute distress Cardiovascular: regular rate with normal  rhythm, no murmurs Pulmonary/Chest: normal work of breathing on room air, lungs clear to auscultation bilaterally Abdominal: soft, non-tender, non-distended, bowel sounds present MSK: no lower extremity edema Skin: warm and dry. Neurological: alert and answering questions appropriately. Psych: appropriate mood and affect   Assessment & Plan:   Prediabetes Patient reports Ozempic along with her diet, which she has been focusing on, has been helping with her weight loss goals and managing prediabetes. She would like to discuss options for medication therapy for prediabetes as she is out of Ozempic, and it is no longer covered by her insurance.  Assessment: Patient began Ozempic therapy over the summer for prediabetes and has had about a 19 kg weight loss in the past 6.5  months. Her A1c on 08/01/22 was 5.7. She is doing well on the medication complemented with lifestyle modifications. She would benefit from continued therapy with Trulicity, which should be covered with her insurance.  Plan -D/C Ozempic -Start Trulicity 3 mg injection weekly -Follow-up in 3 months  Smoking Patient reports she has achieved her goal of tobacco use cessation. Congratulated patient on her achievement.  Healthcare maintenance Patient declines zoster vaccine at this time.  Patient discussed with Dr. Mariel Sleet (Max) Ruby, MS3 12/05/2022

## 2022-12-05 NOTE — Assessment & Plan Note (Signed)
Patient reports she has achieved her goal of tobacco use cessation. Congratulated patient on her achievement.

## 2022-12-05 NOTE — Assessment & Plan Note (Addendum)
Patient reports Ozempic along with her diet, which she has been focusing on, has been helping with her weight loss goals and managing prediabetes. She would like to discuss options for medication therapy for prediabetes as she is out of Ozempic, and it is no longer covered by her insurance.  Assessment: Patient began Ozempic therapy over the summer for prediabetes and has had about a 19 kg weight loss in the past 6.5 months. Her A1c on 08/01/22 was 5.7. She is doing well on the medication complemented with lifestyle modifications. She would benefit from continued therapy with Trulicity, which should be covered with her insurance.  Plan -D/C Ozempic -Start Trulicity 3 mg injection weekly -Follow-up in 3 months

## 2022-12-06 ENCOUNTER — Other Ambulatory Visit: Payer: Self-pay | Admitting: Student

## 2022-12-06 ENCOUNTER — Ambulatory Visit (INDEPENDENT_AMBULATORY_CARE_PROVIDER_SITE_OTHER): Payer: Medicare HMO | Admitting: Student

## 2022-12-06 VITALS — BP 120/76 | HR 80 | Ht 68.0 in | Wt 240.2 lb

## 2022-12-06 DIAGNOSIS — Z0000 Encounter for general adult medical examination without abnormal findings: Secondary | ICD-10-CM

## 2022-12-06 NOTE — Progress Notes (Signed)
Attestation for Student Documentation:  I personally was present and performed or re-performed the history, physical exam and medical decision-making activities of this service and have verified that the service and findings are accurately documented in the student's note.  Virl Axe, MD 12/06/2022, 8:33 AM

## 2022-12-07 ENCOUNTER — Telehealth: Payer: Self-pay | Admitting: *Deleted

## 2022-12-07 DIAGNOSIS — R7303 Prediabetes: Secondary | ICD-10-CM

## 2022-12-07 NOTE — Telephone Encounter (Signed)
RTC to [facility] to inform her that dosing of Trulicity should be 3 mg as patient was previous on Ozempic.  Trulicity '3mg'$  is currently on backorder at the pharmacy.  Message to be sent to Dr. Clayborn Heron.

## 2022-12-07 NOTE — Telephone Encounter (Signed)
Call from Pharmacy to ask about dosing of Trulicity.  Patient has never taken.  Question if she should be on .75 mg .  Need new prescription sent .

## 2022-12-07 NOTE — Progress Notes (Signed)
I reviewed the AWV findings with the provider who conducted the visit. I was present in the office suite and immediately available to provide assistance and direction throughout the time the service was provided.  
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
## 2022-12-08 ENCOUNTER — Telehealth: Payer: Self-pay

## 2022-12-08 DIAGNOSIS — R7303 Prediabetes: Secondary | ICD-10-CM

## 2022-12-08 MED ORDER — TRULICITY 1.5 MG/0.5ML ~~LOC~~ SOAJ: 1.5000 mg | SUBCUTANEOUS | 1 refills | Status: DC

## 2022-12-08 NOTE — Telephone Encounter (Signed)
Decision:Denied  

## 2022-12-08 NOTE — Telephone Encounter (Signed)
Call to [facility] to make sure new prescription was received was received and Trulicity 3 mg will be put on hold.

## 2022-12-08 NOTE — Telephone Encounter (Signed)
Prior Authorization for patient (trulicity) came through on cover my meds was submitted with last office notes awaiting approval or denial

## 2022-12-12 ENCOUNTER — Other Ambulatory Visit: Payer: Self-pay | Admitting: Student

## 2022-12-12 MED ORDER — PEN NEEDLES 32G X 4 MM MISC: 1.0000 | Freq: Every day | 1 refills | Status: DC

## 2022-12-12 MED ORDER — LIRAGLUTIDE 18 MG/3ML ~~LOC~~ SOPN: 1.8000 mg | PEN_INJECTOR | Freq: Every day | SUBCUTANEOUS | 2 refills | Status: DC

## 2022-12-12 NOTE — Assessment & Plan Note (Signed)
Patient previously on Ozempic '2mg'$  weekly. We attempted to switch [name] [name] to trulicity as insurance no longer covers ozempic. Unable to obtain trulicity '3mg'$  or 1.'5mg'$  due to backorder per pharmacy. Discussed with patient, instead will switch to victoza 1.'8mg'$  daily. She is agreeable with this change. She still has leftover ozempic which she is currently taking. Discussed awaiting 1 week until after last ozempic dose to initiate victoza. Educated [name] [name] on changing from weekly to daily dosing with victoza. Also provided separate pen needles.  Plan: -switch to victoza 1.'8mg'$  daily, up-titrate as tolerated -ordered pen needles

## 2022-12-13 ENCOUNTER — Other Ambulatory Visit: Payer: Self-pay | Admitting: Physician Assistant

## 2022-12-14 ENCOUNTER — Telehealth: Payer: Self-pay

## 2022-12-14 NOTE — Telephone Encounter (Signed)
Prior Authorization for patient (Victoza) came through on cover my meds was submitted with last office notes and labs awaiting approval or denial 

## 2022-12-14 NOTE — Telephone Encounter (Signed)
Decision:Denied [name] [name] Key: [id number] - PA Case ID: [id number] Need help? Call us at [phone] Outcome Deniedtoday You asked for the drug above for your Prediabetes. This is an off-label use that is not medically accepted. The Medicare rule in the Prescription Drug Benefit Manual (Chapter 6, Section 10.6) says a drug must be used for a medically accepted indication (covered use). Off-label use is medically accepted when there is proof in one or more of the drug guides that the drug works for your condition. We look at the two major drug guides (compendia): the [place] and the [place] (AHFS-DI). [employer] has decided that there is no proof in either drug guide that this drug works for your condition. In addition, the Medicare rule in the Prescription Drug Manual (Chapter 6, Section 20.1) says drugs used to help you lose weight are excluded from Medicare Part D coverage. Per Medicare rules, it is not covered. Drug Victoza '18MG'$ /3ML pen-injectors Form [employer] Ele

## 2022-12-15 NOTE — Telephone Encounter (Signed)
Patient called regarding her insurance denying ozempic and victoza. Advised patient to call her insurance and ask them what insulins do they cover under her plan, patient stated her insurance told her the diagnosis code has to be associated with diabetes and not pre diabetes. Advised patient we can not falsify information. Patient is requesting a call back to discuss further.

## 2022-12-22 ENCOUNTER — Other Ambulatory Visit: Payer: Self-pay | Admitting: Student

## 2022-12-22 NOTE — Progress Notes (Signed)
Unfortunately her Trulicity is not covered by her Medicare under prediabetes diagnosis.  Patient agreed with enrollment in the physician supervised exercise program.  Order placed.

## 2022-12-23 ENCOUNTER — Telehealth: Payer: Self-pay

## 2022-12-23 NOTE — Telephone Encounter (Signed)
Called re: PREP program referral, wants to go to community center near her to exercise

## 2023-01-02 ENCOUNTER — Ambulatory Visit: Payer: Medicare HMO | Admitting: Physician Assistant

## 2023-01-12 ENCOUNTER — Other Ambulatory Visit: Payer: Self-pay | Admitting: Physician Assistant

## 2023-01-13 NOTE — Telephone Encounter (Signed)
Verified with pt she is taking Trazodone 150 mg ONE tablet only. Rx updated

## 2023-01-17 ENCOUNTER — Other Ambulatory Visit: Payer: Self-pay | Admitting: Physician Assistant

## 2023-01-20 ENCOUNTER — Ambulatory Visit: Payer: Medicare HMO | Admitting: Physician Assistant

## 2023-01-25 ENCOUNTER — Encounter: Payer: Self-pay | Admitting: Physician Assistant

## 2023-01-25 ENCOUNTER — Ambulatory Visit (INDEPENDENT_AMBULATORY_CARE_PROVIDER_SITE_OTHER): Payer: Medicare HMO | Admitting: Physician Assistant

## 2023-01-25 DIAGNOSIS — F3342 Major depressive disorder, recurrent, in full remission: Secondary | ICD-10-CM

## 2023-01-25 DIAGNOSIS — F411 Generalized anxiety disorder: Secondary | ICD-10-CM | POA: Diagnosis not present

## 2023-01-25 DIAGNOSIS — G4700 Insomnia, unspecified: Secondary | ICD-10-CM | POA: Diagnosis not present
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
## 2023-01-27 ENCOUNTER — Ambulatory Visit (HOSPITAL_COMMUNITY)
Admission: EM | Admit: 2023-01-27 | Discharge: 2023-01-27 | Disposition: A | Discharge status: Home / Self Care | Payer: Medicare HMO | Attending: Emergency Medicine | Admitting: Emergency Medicine

## 2023-01-27 ENCOUNTER — Encounter (HOSPITAL_COMMUNITY): Payer: Self-pay | Admitting: *Deleted

## 2023-01-27 DIAGNOSIS — J441 Chronic obstructive pulmonary disease with (acute) exacerbation: Secondary | ICD-10-CM | POA: Insufficient documentation

## 2023-01-27 DIAGNOSIS — U071 COVID-19: Secondary | ICD-10-CM | POA: Insufficient documentation

## 2023-01-27 MED ORDER — AMOXICILLIN-POT CLAVULANATE 875-125 MG PO TABS: 1.0000 | ORAL_TABLET | Freq: Two times a day (BID) | ORAL | 0 refills | Status: AC

## 2023-01-27 MED ORDER — PREDNISONE 20 MG PO TABS: 40.0000 mg | ORAL_TABLET | Freq: Every day | ORAL | 0 refills | Status: AC

## 2023-01-27 NOTE — ED Provider Notes (Signed)
[place]    CSN: [id number] Arrival date & time: 01/27/23  1213      History   Chief Complaint Chief Complaint  Patient presents with   Cough   Nasal Congestion    HPI [name] [name] is a 67 y.o. female.  Here with 4-5 day history of cough Productive of green and yellow thick sputum Some nasal congestion and body aches No fevers at home. Denies wheezing or shortness of breath. She does not wear oxygen at home. Usually sits around 90%  No meds taken  History of COPD, she does not usually have production with cough +covid exposure last week  Past Medical History:  Diagnosis Date   Anxiety    Asthma    Chronic, with restrictive component 2/2 obesity   Depression    GERD (gastroesophageal reflux disease)    Headache    Insomnia    Obesity    On home oxygen therapy    "3L at night only when I get sick" (04/30/2014)   Shortness of breath dyspnea    Sleep apnea    "couldn't afford mask so I didn't get it" (04/30/2014)    Patient Active Problem List   Diagnosis Date Noted   Osteopenia 09/16/2022   Prediabetes 08/01/2022   Posterior right knee pain 02/06/2019   OCD (obsessive compulsive disorder) 08/13/2018   Aortic atherosclerosis ([place]) 08/17/2016   Rash and nonspecific skin eruption 12/17/2015   Obesity hypoventilation syndrome ([place]) 01/21/2015   Healthcare maintenance 07/01/2014   OSA (obstructive sleep apnea) 04/04/2013   Anxiety disorder 07/02/2012   Depression 06/28/2012   Morbid obesity ([place]) 03/02/2012   GERD (gastroesophageal reflux disease) 02/05/2012   Smoking 02/05/2012    Past Surgical History:  Procedure Laterality Date   APPENDECTOMY  ~ 2002   VAGINAL HYSTERECTOMY  ~ 1978    OB History   No obstetric history on file.      Home Medications    Prior to Admission medications   Medication Sig Start Date End Date Taking? Authorizing Provider  albuterol (VENTOLIN HFA) 108 (90 Base) MCG/ACT inhaler Inhale 2 puffs into  the lungs every 6 (six) hours as needed for wheezing or shortness of breath. 06/16/20  Yes Andrew Au, MD  ALPRAZolam Duanne Moron) 1 MG tablet TAKE 1/2 TO 1 TABLET(0.5 TO 1 MG) BY MOUTH THREE TIMES DAILY AS NEEDED FOR ANXIETY 01/18/23  Yes Donnal Moat T, PA-C  amoxicillin-clavulanate (AUGMENTIN) 875-125 MG tablet Take 1 tablet by mouth 2 (two) times daily for 5 days. 01/27/23 02/01/23 Yes Elasia Furnish, Wells Guiles, PA-C  atorvastatin (LIPITOR) 10 MG tablet TAKE 1 TABLET(10 MG) BY MOUTH DAILY 06/17/22  Yes Iona Beard, MD  Cholecalciferol 20 MCG (800 UNIT) TABS Take 1 tablet by mouth daily. 08/31/22 08/26/23 Yes Iona Beard, MD  fluticasone furoate-vilanterol (BREO ELLIPTA) 100-25 MCG/INH AEPB Inhale 1 puff into the lungs daily. 06/16/20  Yes Andrew Au, MD  gabapentin (NEURONTIN) 100 MG capsule TAKE 1 CAPSULE(100 MG) BY MOUTH THREE TIMES DAILY 03/14/22  Yes Trula Slade, DPM  Insulin Pen Needle (PEN NEEDLES) 32G X 4 MM MISC 1 Needle by Does not apply route daily at 6 (six) AM. 12/12/22  Yes Virl Axe, MD  pantoprazole (PROTONIX) 40 MG tablet TAKE 1 TABLET(40 MG) BY MOUTH DAILY 11/28/22  Yes Iona Beard, MD  predniSONE (DELTASONE) 20 MG tablet Take 2 tablets (40 mg total) by mouth daily with breakfast for 5 days. 01/27/23 02/01/23 Yes Torry Istre, Wells Guiles, PA-C  sertraline (ZOLOFT)  100 MG tablet TAKE 3 TABLETS(300 MG) BY MOUTH DAILY 12/13/22  Yes Hurst, Teresa T, PA-C  traZODone (DESYREL) 150 MG tablet Take 1 tablet (150 mg total) by mouth at bedtime. 01/13/23  Yes Donnal Moat T, PA-C  acetaminophen (TYLENOL) 500 MG tablet Take 1,000 mg by mouth every 6 (six) hours as needed (for pain OR back pain).     [provider]    Family History Family History  Problem Relation Age of Onset   Breast cancer Mother    Alcohol abuse Mother    Emphysema Mother        smoked   Asthma Mother    Breast cancer Maternal Aunt    Breast cancer Maternal Grandmother     Social History Social History    Tobacco Use   Smoking status: Former    Packs/day: 0.00    Years: 44.00    Additional pack years: 0.00    Total pack years: 0.00    Types: Cigarettes    Quit date: 11/07/2012    Years since quitting: 10.2   Smokeless tobacco: Never  Substance Use Topics   Alcohol use: No    Alcohol/week: 0.0 standard drinks of alcohol   Drug use: No     Allergies   Ibuprofen and Clonazepam   Review of Systems Review of Systems  Respiratory:  Positive for cough.    As per HPI  Physical Exam Triage Vital Signs ED Triage Vitals  Enc Vitals Group     BP 01/27/23 1310 119/77     Pulse Rate 01/27/23 1310 78     Resp 01/27/23 1310 20     Temp 01/27/23 1310 98.2 F (36.8 C)     Temp Source 01/27/23 1310 Oral     SpO2 01/27/23 1310 91 %     Weight --      Height --      Head Circumference --      Peak Flow --      Pain Score 01/27/23 1308 0     Pain Loc --      Pain Edu? --      Excl. in [place]? --    No data found.  Updated Vital Signs BP 119/77 (BP Location: Right Arm)   Pulse 78   Temp 98.2 F (36.8 C) (Oral)   Resp 20   SpO2 91% Comment: pt has CPOD sattes it is her normal  Physical Exam Vitals and nursing note reviewed.  Constitutional:      General: She is not in acute distress.    Appearance: She is not diaphoretic.  HENT:     Nose: No congestion or rhinorrhea.     Mouth/Throat:     Mouth: Mucous membranes are moist.     Pharynx: Oropharynx is clear.  Eyes:     Conjunctiva/sclera: Conjunctivae normal.  Cardiovascular:     Rate and Rhythm: Normal rate and regular rhythm.     Heart sounds: Normal heart sounds.  Pulmonary:     Effort: Pulmonary effort is normal.     Breath sounds: Decreased breath sounds present. No wheezing.     Comments: Decreased sounds throughout without wheezing or rales Musculoskeletal:     Cervical back: Normal range of motion.  Skin:    General: Skin is warm and dry.  Neurological:     Mental Status: She is alert and oriented to  person, place, and time.     UC Treatments / Results  Labs (all labs  ordered are listed, but only abnormal results are displayed) Labs Reviewed  SARS CORONAVIRUS 2 (TAT 6-24 HRS)    EKG  Radiology No results found.  Procedures Procedures   Medications Ordered in UC Medications - No data to display  Initial Impression / Assessment and Plan / UC Course  I have reviewed the triage vital signs and the nursing notes.  Pertinent labs & imaging results that were available during my care of the patient were reviewed by me and considered in my medical decision making (see chart for details).  Increased sputum purulence and volume compared to baseline. Meets 2/3 cardinal signs for abx. Cover for acute COPD exacerbation with Augmentin BID x 5, prednisone 40 mg x 5 COVID test pending per patient request.  No antivirals if positive Discussed return and ED precautions  Final Clinical Impressions(s) / UC Diagnoses   Final diagnoses:  COPD exacerbation ([place])     Discharge Instructions      Augmentin (antibiotic) twice daily for 5 days Prednisone (steroid) daily for 5 days Use inhaler at home Increase fluid intake We will call you if your covid test returns positive.      ED Prescriptions     Medication Sig Dispense Auth. Provider   amoxicillin-clavulanate (AUGMENTIN) 875-125 MG tablet Take 1 tablet by mouth 2 (two) times daily for 5 days. 10 tablet Jordanne Elsbury, PA-C   predniSONE (DELTASONE) 20 MG tablet Take 2 tablets (40 mg total) by mouth daily with breakfast for 5 days. 10 tablet Daryl Beehler, Wells Guiles, PA-C      PDMP not reviewed this encounter.   [place], [place] 01/27/23 1515

## 2023-01-27 NOTE — Discharge Instructions (Addendum)
Augmentin (antibiotic) twice daily for 5 days Prednisone (steroid) daily for 5 days Use inhaler at home Increase fluid intake We will call you if your covid test returns positive.

## 2023-01-27 NOTE — ED Triage Notes (Signed)
Pt states she has cough, congestion X 4 days. She is having body aches and she was exposed to [place]. She isnt taking nay meds for her sx

## 2023-01-28 LAB — SARS CORONAVIRUS 2 (TAT 6-24 HRS): SARS Coronavirus 2: POSITIVE — AB

## 2023-02-01 ENCOUNTER — Telehealth: Payer: Self-pay

## 2023-02-01 NOTE — Telephone Encounter (Signed)
Spoke to pt regarding positive COVID test results. Pt expresses she the sxs have not improved. States she has 1 more round of Prednisone left and two more pills left of Augmentin. Pt expresses she would lie something stronger or a refill of Prednisone.   I advised patient to be seen by PCP or return to UC for f/u.  Pt expressed understanding.

## 2023-02-02 ENCOUNTER — Telehealth: Payer: Self-pay

## 2023-02-02 NOTE — Telephone Encounter (Signed)
Requesting to speak with a nurse about getting medication filled from urgent care. Pt states she has COVID.

## 2023-02-02 NOTE — Telephone Encounter (Signed)
Returned call to patient. States she completed 5 days of antibiotics and prednisone today. States, "I'm no better. I feel terrible." States continues with cough productive of yellow/green sputum, no energy, chills and sweats, "panting walking one room to the next." No openings in [facility] till 4/9.  Patient states UC only notified her yesterday that she was positive for Covid even though she was tested on 3/22. She does not feel comfortable going back to UC. She is advised to head to ED as she may need CXR to r/o pneumonia. She agrees to go.

## 2023-02-06 ENCOUNTER — Emergency Department (HOSPITAL_COMMUNITY): Payer: Medicare HMO

## 2023-02-06 ENCOUNTER — Inpatient Hospital Stay (HOSPITAL_COMMUNITY)
Admission: EM | Admit: 2023-02-06 | Discharge: 2023-02-10 | DRG: 872 | Disposition: A | Discharge status: Home / Self Care | Payer: Medicare HMO | Attending: Internal Medicine | Admitting: Internal Medicine

## 2023-02-06 ENCOUNTER — Other Ambulatory Visit: Payer: Self-pay

## 2023-02-06 ENCOUNTER — Encounter (HOSPITAL_COMMUNITY): Payer: Self-pay

## 2023-02-06 DIAGNOSIS — A4151 Sepsis due to Escherichia coli [E. coli]: Secondary | ICD-10-CM | POA: Diagnosis not present

## 2023-02-06 DIAGNOSIS — D696 Thrombocytopenia, unspecified: Secondary | ICD-10-CM | POA: Diagnosis present

## 2023-02-06 DIAGNOSIS — D649 Anemia, unspecified: Secondary | ICD-10-CM | POA: Diagnosis present

## 2023-02-06 DIAGNOSIS — R0602 Shortness of breath: Secondary | ICD-10-CM | POA: Diagnosis not present

## 2023-02-06 DIAGNOSIS — Z79899 Other long term (current) drug therapy: Secondary | ICD-10-CM

## 2023-02-06 DIAGNOSIS — G4700 Insomnia, unspecified: Secondary | ICD-10-CM | POA: Diagnosis present

## 2023-02-06 DIAGNOSIS — Z803 Family history of malignant neoplasm of breast: Secondary | ICD-10-CM

## 2023-02-06 DIAGNOSIS — D72829 Elevated white blood cell count, unspecified: Secondary | ICD-10-CM | POA: Diagnosis present

## 2023-02-06 DIAGNOSIS — A419 Sepsis, unspecified organism: Secondary | ICD-10-CM | POA: Diagnosis present

## 2023-02-06 DIAGNOSIS — R6520 Severe sepsis without septic shock: Secondary | ICD-10-CM | POA: Diagnosis present

## 2023-02-06 DIAGNOSIS — I959 Hypotension, unspecified: Secondary | ICD-10-CM | POA: Diagnosis present

## 2023-02-06 DIAGNOSIS — Z794 Long term (current) use of insulin: Secondary | ICD-10-CM

## 2023-02-06 DIAGNOSIS — B9620 Unspecified Escherichia coli [E. coli] as the cause of diseases classified elsewhere: Secondary | ICD-10-CM | POA: Diagnosis present

## 2023-02-06 DIAGNOSIS — E860 Dehydration: Secondary | ICD-10-CM | POA: Diagnosis present

## 2023-02-06 DIAGNOSIS — N390 Urinary tract infection, site not specified: Secondary | ICD-10-CM | POA: Diagnosis present

## 2023-02-06 DIAGNOSIS — Z90710 Acquired absence of both cervix and uterus: Secondary | ICD-10-CM

## 2023-02-06 DIAGNOSIS — R197 Diarrhea, unspecified: Secondary | ICD-10-CM | POA: Diagnosis present

## 2023-02-06 DIAGNOSIS — Z9981 Dependence on supplemental oxygen: Secondary | ICD-10-CM

## 2023-02-06 DIAGNOSIS — Z8616 Personal history of COVID-19: Secondary | ICD-10-CM

## 2023-02-06 DIAGNOSIS — J9601 Acute respiratory failure with hypoxia: Principal | ICD-10-CM

## 2023-02-06 DIAGNOSIS — U071 COVID-19: Secondary | ICD-10-CM

## 2023-02-06 DIAGNOSIS — E869 Volume depletion, unspecified: Secondary | ICD-10-CM | POA: Diagnosis present

## 2023-02-06 DIAGNOSIS — K219 Gastro-esophageal reflux disease without esophagitis: Secondary | ICD-10-CM | POA: Diagnosis present

## 2023-02-06 DIAGNOSIS — Z811 Family history of alcohol abuse and dependence: Secondary | ICD-10-CM

## 2023-02-06 DIAGNOSIS — F419 Anxiety disorder, unspecified: Secondary | ICD-10-CM | POA: Diagnosis present

## 2023-02-06 DIAGNOSIS — E662 Morbid (severe) obesity with alveolar hypoventilation: Secondary | ICD-10-CM | POA: Diagnosis present

## 2023-02-06 DIAGNOSIS — Z7951 Long term (current) use of inhaled steroids: Secondary | ICD-10-CM

## 2023-02-06 DIAGNOSIS — G4733 Obstructive sleep apnea (adult) (pediatric): Secondary | ICD-10-CM | POA: Diagnosis present

## 2023-02-06 DIAGNOSIS — Z87891 Personal history of nicotine dependence: Secondary | ICD-10-CM

## 2023-02-06 DIAGNOSIS — Z6835 Body mass index (BMI) 35.0-35.9, adult: Secondary | ICD-10-CM

## 2023-02-06 DIAGNOSIS — E1169 Type 2 diabetes mellitus with other specified complication: Secondary | ICD-10-CM

## 2023-02-06 DIAGNOSIS — Z888 Allergy status to other drugs, medicaments and biological substances status: Secondary | ICD-10-CM

## 2023-02-06 DIAGNOSIS — E119 Type 2 diabetes mellitus without complications: Secondary | ICD-10-CM | POA: Diagnosis present

## 2023-02-06 DIAGNOSIS — J4489 Other specified chronic obstructive pulmonary disease: Secondary | ICD-10-CM | POA: Diagnosis present

## 2023-02-06 DIAGNOSIS — F429 Obsessive-compulsive disorder, unspecified: Secondary | ICD-10-CM | POA: Diagnosis present

## 2023-02-06 DIAGNOSIS — Z825 Family history of asthma and other chronic lower respiratory diseases: Secondary | ICD-10-CM

## 2023-02-06 DIAGNOSIS — F32A Depression, unspecified: Secondary | ICD-10-CM | POA: Diagnosis present

## 2023-02-06 DIAGNOSIS — R7881 Bacteremia: Secondary | ICD-10-CM | POA: Diagnosis present

## 2023-02-06 MED ORDER — ONDANSETRON HCL 4 MG/2ML IJ SOLN
4.0000 mg | Freq: Once | INTRAMUSCULAR | Status: AC
  Administered 2023-02-06: 4 mg via INTRAVENOUS
  Filled 2023-02-06: qty 2

## 2023-02-06 MED ORDER — ALBUTEROL SULFATE HFA 108 (90 BASE) MCG/ACT IN AERS: 2.0000 | INHALATION_SPRAY | RESPIRATORY_TRACT | Status: DC | PRN

## 2023-02-06 MED ORDER — ACETAMINOPHEN 500 MG PO TABS
1000.0000 mg | ORAL_TABLET | Freq: Once | ORAL | Status: AC
  Administered 2023-02-06: 1000 mg via ORAL
  Filled 2023-02-06: qty 2

## 2023-02-06 MED ORDER — SODIUM CHLORIDE 0.9 % IV BOLUS
1000.0000 mL | Freq: Once | INTRAVENOUS | Status: AC
  Administered 2023-02-07: 1000 mL via INTRAVENOUS

## 2023-02-06 MED ORDER — LACTATED RINGERS IV BOLUS: 1000.0000 mL | Freq: Once | INTRAVENOUS | Status: DC

## 2023-02-06 NOTE — ED Provider Notes (Incomplete)
[place] EMERGENCY DEPARTMENT AT [facility] Provider Note   CSN: [id number] Arrival date & time: 02/06/23  2152     History {Add pertinent medical, surgical, social history, OB history to HPI:1} Chief Complaint  Patient presents with  . Shortness of Breath    [name] [name] is a 67 y.o. female.   Shortness of Breath  Patient is a 67 year old female with past medical history significant for asthma/COPD, morbid obesity and obesity hypoventilation syndrome, OCD, anxiety, depression, reflux  She presents emergency room today with complaints of shortness of breath.  She states that she tested positive for COVID-19      Home Medications Prior to Admission medications   Medication Sig Start Date End Date Taking? Authorizing Provider  acetaminophen (TYLENOL) 500 MG tablet Take 1,000 mg by mouth every 6 (six) hours as needed (for pain OR back pain).     [provider]  albuterol (VENTOLIN HFA) 108 (90 Base) MCG/ACT inhaler Inhale 2 puffs into the lungs every 6 (six) hours as needed for wheezing or shortness of breath. 06/16/20   Andrew Au, MD  ALPRAZolam Duanne Moron) 1 MG tablet TAKE 1/2 TO 1 TABLET(0.5 TO 1 MG) BY MOUTH THREE TIMES DAILY AS NEEDED FOR ANXIETY 01/18/23   Donnal Moat T, PA-C  atorvastatin (LIPITOR) 10 MG tablet TAKE 1 TABLET(10 MG) BY MOUTH DAILY 06/17/22   Iona Beard, MD  Cholecalciferol 20 MCG (800 UNIT) TABS Take 1 tablet by mouth daily. 08/31/22 08/26/23  Iona Beard, MD  fluticasone furoate-vilanterol (BREO ELLIPTA) 100-25 MCG/INH AEPB Inhale 1 puff into the lungs daily. 06/16/20   Andrew Au, MD  gabapentin (NEURONTIN) 100 MG capsule TAKE 1 CAPSULE(100 MG) BY MOUTH THREE TIMES DAILY 03/14/22   Trula Slade, DPM  Insulin Pen Needle (PEN NEEDLES) 32G X 4 MM MISC 1 Needle by Does not apply route daily at 6 (six) AM. 12/12/22   Virl Axe, MD  pantoprazole (PROTONIX) 40 MG tablet TAKE 1 TABLET(40 MG) BY MOUTH DAILY 11/28/22    Iona Beard, MD  sertraline (ZOLOFT) 100 MG tablet TAKE 3 TABLETS(300 MG) BY MOUTH DAILY 12/13/22   Donnal Moat T, PA-C  traZODone (DESYREL) 150 MG tablet Take 1 tablet (150 mg total) by mouth at bedtime. 01/13/23   Addison Lank, PA-C      Allergies    Ibuprofen and Clonazepam    Review of Systems   Review of Systems  Respiratory:  Positive for shortness of breath.     Physical Exam Updated Vital Signs BP (!) 97/58 (BP Location: Left Arm)   Pulse 100   Temp 98.9 F (37.2 C) (Oral)   Resp (!) 30   Ht 5\' 8"  (1.727 m)   Wt 104.3 kg   SpO2 90%   BMI 34.97 kg/m  Physical Exam Vitals and nursing note reviewed.  Constitutional:      General: She is not in acute distress. HENT:     Head: Normocephalic and atraumatic.     Nose: Nose normal.  Eyes:     General: No scleral icterus. Cardiovascular:     Rate and Rhythm: Normal rate and regular rhythm.     Pulses: Normal pulses.     Heart sounds: Normal heart sounds.  Pulmonary:     Effort: Pulmonary effort is normal. No respiratory distress.     Breath sounds: No wheezing.  Abdominal:     Palpations: Abdomen is soft.     Tenderness: There is no abdominal tenderness.  Musculoskeletal:     Cervical back: Normal range of motion.     Right lower leg: No edema.     Left lower leg: No edema.  Skin:    General: Skin is warm and dry.     Capillary Refill: Capillary refill takes less than 2 seconds.  Neurological:     Mental Status: She is alert. Mental status is at baseline.  Psychiatric:        Mood and Affect: Mood normal.        Behavior: Behavior normal.     ED Results / Procedures / Treatments   Labs (all labs ordered are listed, but only abnormal results are displayed) Labs Reviewed  BASIC METABOLIC PANEL  CBC  TROPONIN I (HIGH SENSITIVITY)    EKG None  Radiology DG Chest 2 View  Result Date: 02/06/2023 CLINICAL DATA:  Shortness of breath. EXAM: CHEST - 2 VIEW COMPARISON:  Chest radiograph dated  06/05/2022. FINDINGS: No focal consolidation, pleural effusion, or pneumothorax. The cardiac silhouette is within normal limits. No acute osseous pathology. IMPRESSION: No active cardiopulmonary disease. Electronically Signed   By: Anner Crete M.D.   On: 02/06/2023 22:45    Procedures Procedures  {Document cardiac monitor, telemetry assessment procedure when appropriate:1}  Medications Ordered in ED Medications  albuterol (VENTOLIN HFA) 108 (90 Base) MCG/ACT inhaler 2 puff (has no administration in time range)  lactated ringers bolus 1,000 mL (has no administration in time range)  acetaminophen (TYLENOL) tablet 1,000 mg (has no administration in time range)    ED Course/ Medical Decision Making/ A&P Clinical Course as of 02/06/23 2309  Mon Feb 06, 2023  2304 10 d ago patient was dx w COVID and 7 days ago became quite SOB.  Currently is moving and feels mold/dust from move has worsened her COPD.   Became more and more fatigued more SOB.  Nauseated without vomiting.   [WF]    Clinical Course User Index [WF] Tedd Sias, PA   {   Click here for ABCD2, HEART and other calculatorsREFRESH Note before signing :1}                          Medical Decision Making Amount and/or Complexity of Data Reviewed Labs: ordered. Radiology: ordered.  Risk OTC drugs. Prescription drug management.   ***  {Document critical care time when appropriate:1} {Document review of labs and clinical decision tools ie heart score, Chads2Vasc2 etc:1}  {Document your independent review of radiology images, and any outside records:1} {Document your discussion with family members, caretakers, and with consultants:1} {Document social determinants of health affecting pt's care:1} {Document your decision making why or why not admission, treatments were needed:1} Final Clinical Impression(s) / ED Diagnoses Final diagnoses:  None    Rx / DC Orders ED Discharge Orders     None

## 2023-02-06 NOTE — ED Provider Notes (Signed)
[place] EMERGENCY DEPARTMENT AT [facility] Provider Note   CSN: [id number] Arrival date & time: 02/06/23  2152     History  Chief Complaint  Patient presents with   Shortness of Breath    [name] [name] is a 67 y.o. female.   Shortness of Breath  Patient is a 67 year old female with past medical history significant for asthma/COPD, morbid obesity and obesity hypoventilation syndrome, OCD, anxiety, depression, reflux  She presents emergency room today with complaints of shortness of breath.  She states that she tested positive for COVID-19 10 days ago was having some sinus congestion and fatigue and cough at that time she states that approximately 7 days ago she became more short of breath and has become more more progressively short of breath since that time.  She denies any hemoptysis lightheadedness or dizziness.  She states that she has also had numerous episodes of nonbloody diarrhea.  She has been nauseous but has not experienced any vomiting.  She states that she is generally fatigued but also somewhat more short of breath.  She denies any chest pain or fevers at home.    Home Medications Prior to Admission medications   Medication Sig Start Date End Date Taking? Authorizing Provider  acetaminophen (TYLENOL) 500 MG tablet Take 1,000 mg by mouth every 6 (six) hours as needed (for pain OR back pain).     [provider]  albuterol (VENTOLIN HFA) 108 (90 Base) MCG/ACT inhaler Inhale 2 puffs into the lungs every 6 (six) hours as needed for wheezing or shortness of breath. 06/16/20   Remo Lipps, MD  ALPRAZolam Prudy Feeler) 1 MG tablet TAKE 1/2 TO 1 TABLET(0.5 TO 1 MG) BY MOUTH THREE TIMES DAILY AS NEEDED FOR ANXIETY 01/18/23   [name], PA-C  atorvastatin (LIPITOR) 10 MG tablet TAKE 1 TABLET(10 MG) BY MOUTH DAILY 06/17/22   Quincy Simmonds, MD  Cholecalciferol 20 MCG (800 UNIT) TABS Take 1 tablet by mouth daily. 08/31/22 08/26/23  Quincy Simmonds, MD   fluticasone furoate-vilanterol (BREO ELLIPTA) 100-25 MCG/INH AEPB Inhale 1 puff into the lungs daily. 06/16/20   Remo Lipps, MD  gabapentin (NEURONTIN) 100 MG capsule TAKE 1 CAPSULE(100 MG) BY MOUTH THREE TIMES DAILY 03/14/22   Vivi Barrack, DPM  Insulin Pen Needle (PEN NEEDLES) 32G X 4 MM MISC 1 Needle by Does not apply route daily at 6 (six) AM. 12/12/22   Merrilyn Puma, MD  pantoprazole (PROTONIX) 40 MG tablet TAKE 1 TABLET(40 MG) BY MOUTH DAILY 11/28/22   Quincy Simmonds, MD  sertraline (ZOLOFT) 100 MG tablet TAKE 3 TABLETS(300 MG) BY MOUTH DAILY 12/13/22   [name], PA-C  traZODone (DESYREL) 150 MG tablet Take 1 tablet (150 mg total) by mouth at bedtime. 01/13/23   Cherie Ouch, PA-C      Allergies    Ibuprofen and Clonazepam    Review of Systems   Review of Systems  Respiratory:  Positive for shortness of breath.     Physical Exam Updated Vital Signs BP (!) 97/58 (BP Location: Left Arm)   Pulse 100   Temp 98.9 F (37.2 C) (Oral)   Resp (!) 30   Ht 5\' 8"  (1.727 m)   Wt 104.3 kg   SpO2 90%   BMI 34.97 kg/m  Physical Exam Vitals and nursing note reviewed.  Constitutional:      General: She is not in acute distress.    Appearance: She is obese. She is ill-appearing. She  is not diaphoretic.  HENT:     Head: Normocephalic and atraumatic.     Nose: Nose normal.     Mouth/Throat:     Mouth: Mucous membranes are dry.  Eyes:     General: No scleral icterus. Cardiovascular:     Rate and Rhythm: Normal rate and regular rhythm.     Pulses: Normal pulses.     Heart sounds: Normal heart sounds.  Pulmonary:     Effort: Pulmonary effort is normal. No respiratory distress.     Breath sounds: No wheezing.     Comments: Coughs frequently, speaking full sentences, no wheezing 2 L nasal cannula Abdominal:     Palpations: Abdomen is soft.     Tenderness: There is no abdominal tenderness.  Musculoskeletal:     Cervical back: Normal range of motion.     Right lower leg:  No edema.     Left lower leg: No edema.  Skin:    General: Skin is warm and dry.     Capillary Refill: Capillary refill takes less than 2 seconds.  Neurological:     Mental Status: She is alert. Mental status is at baseline.  Psychiatric:        Mood and Affect: Mood normal.        Behavior: Behavior normal.     ED Results / Procedures / Treatments   Labs (all labs ordered are listed, but only abnormal results are displayed) Labs Reviewed  BASIC METABOLIC PANEL  CBC  TROPONIN I (HIGH SENSITIVITY)    EKG None  Radiology DG Chest 2 View  Result Date: 02/06/2023 CLINICAL DATA:  Shortness of breath. EXAM: CHEST - 2 VIEW COMPARISON:  Chest radiograph dated 06/05/2022. FINDINGS: No focal consolidation, pleural effusion, or pneumothorax. The cardiac silhouette is within normal limits. No acute osseous pathology. IMPRESSION: No active cardiopulmonary disease. Electronically Signed   By: Elgie Collard M.D.   On: 02/06/2023 22:45    Procedures .Critical Care  Performed by: Gailen Shelter, PA Authorized by: Gailen Shelter, PA   Critical care provider statement:    Critical care time (minutes):  35   Critical care time was exclusive of:  Separately billable procedures and treating other patients and teaching time   Critical care was necessary to treat or prevent imminent or life-threatening deterioration of the following conditions:  Sepsis   Critical care was time spent personally by me on the following activities:  Development of treatment plan with patient or surrogate, review of old charts, re-evaluation of patient's condition, pulse oximetry, ordering and review of radiographic studies, ordering and review of laboratory studies, ordering and performing treatments and interventions, obtaining history from patient or surrogate, examination of patient and evaluation of patient's response to treatment   Care discussed with: admitting provider       Medications Ordered in  ED Medications  albuterol (VENTOLIN HFA) 108 (90 Base) MCG/ACT inhaler 2 puff (has no administration in time range)  lactated ringers bolus 1,000 mL (has no administration in time range)  acetaminophen (TYLENOL) tablet 1,000 mg (has no administration in time range)    ED Course/ Medical Decision Making/ A&P Clinical Course as of 02/06/23 2309  Mon Feb 06, 2023  2304 10 d ago patient was dx w COVID and 7 days ago became quite SOB.  Currently is moving and feels mold/dust from move has worsened her COPD.   Became more and more fatigued more SOB.  Nauseated without vomiting.   [WF]  Clinical Course User Index [WF] Gailen Shelter, [place]                             Medical Decision Making Amount and/or Complexity of Data Reviewed Labs: ordered. Radiology: ordered.  Risk OTC drugs. Prescription drug management. Decision regarding hospitalization.   This patient presents to the ED for concern of SOB / weakness, this involves a number of treatment options, and is a complaint that carries with it a high risk of complications and morbidity. A differential diagnosis was considered for the patient's symptoms which is discussed below:   The causes for shortness of breath include but are not limited to Cardiac (AHF, pericardial effusion and tamponade, arrhythmias, ischemia, etc) Respiratory (COPD, asthma, pneumonia, pneumothorax, primary pulmonary hypertension, PE/VQ mismatch) Hematological (anemia) Neuromuscular (ALS, Guillain-Barr, etc)    Co morbidities: Discussed in HPI   Brief History:  Patient is a 67 year old female with past medical history significant for asthma/COPD, morbid obesity and obesity hypoventilation syndrome, OCD, anxiety, depression, reflux  She presents emergency room today with complaints of shortness of breath.  She states that she tested positive for COVID-19 10 days ago was having some sinus congestion and fatigue and cough at that time she states that  approximately 7 days ago she became more short of breath and has become more more progressively short of breath since that time.  She denies any hemoptysis lightheadedness or dizziness.  She states that she has also had numerous episodes of nonbloody diarrhea.  She has been nauseous but has not experienced any vomiting.  She states that she is generally fatigued but also somewhat more short of breath.  She denies any chest pain or fevers at home.    EMR reviewed including pt PMHx, past surgical history and past visits to ER.   See HPI for more details   Lab Tests:   I ordered and independently interpreted labs. Labs notable for Leukocytosis present BMP nml UA 5:14 AM + for UTI Trop slightly elevated likely 2/2 hypotension.  LA nml at 1.3  Imaging Studies:  NAD. I personally reviewed all imaging studies and no acute abnormality found. I agree with radiology interpretation.    Last echocardiogram was done 12/02/2020 which showed EF of 55-60%, no regional wall motion abnormalities and only trivial mitral valve regurg.  Cardiac Monitoring:  The patient was maintained on a cardiac monitor.  I personally viewed and interpreted the cardiac monitored which showed an underlying rhythm of: NSR EKG non-ischemic   Medicines ordered:  I ordered medication including 2 L of crystalloid IV, Tylenol 1000 mg, Zofran, Rocephin for hypotension, dehydration Reevaluation of the patient after these medicines showed that the patient improved I have reviewed the patients home medicines and have made adjustments as needed   Critical Interventions:   admission, antibiotics   Consults/Attending Physician   I requested consultation with Dr. Loney Loh,  and discussed lab and imaging findings as well as pertinent plan - they recommend:     I discussed this case with my attending physician who cosigned this note including patient's presenting symptoms, physical exam, and planned diagnostics and  interventions. Attending physician stated agreement with plan or made changes to plan which were implemented.   Attending physician assessed patient at bedside.   Reevaluation:  After the interventions noted above I re-evaluated patient and found that they have :improved   Social Determinants of Health:      Problem List /  ED Course:  67 year old female with mildly hypoxic, very fatigued found to have UTI on urinalysis.  CT PE study negative for pulmonary embolism.  Hypotension resolved with crystalloid hydration.  On supplemental oxygen 2 L nasal cannula.   Dispostion:  After consideration of the diagnostic results and the patients response to treatment, I feel that the patent would benefit from admission  Final Clinical Impression(s) / ED Diagnoses Final diagnoses:  None    Rx / DC Orders ED Discharge Orders     None         Gailen Shelter, [place] 02/07/23 0516    Nira Conn, MD 02/11/23 1743

## 2023-02-06 NOTE — ED Triage Notes (Signed)
Patient reports dx with covid on last Wednesday. Patient c/o SOB. Patient atr 88% on RA Patient placed on oxygen at 2 LPM via [place]. Per EMS patient hypotensive en route. Patient given 500 ml bolus and BP came up to 90/57. Patient alert and oriented on arrival and able to answer all triage questions appropriately

## 2023-02-07 ENCOUNTER — Emergency Department (HOSPITAL_COMMUNITY): Payer: Medicare HMO

## 2023-02-07 ENCOUNTER — Other Ambulatory Visit: Payer: Self-pay

## 2023-02-07 ENCOUNTER — Encounter (HOSPITAL_COMMUNITY): Payer: Self-pay

## 2023-02-07 DIAGNOSIS — I959 Hypotension, unspecified: Secondary | ICD-10-CM | POA: Diagnosis present

## 2023-02-07 DIAGNOSIS — Z6835 Body mass index (BMI) 35.0-35.9, adult: Secondary | ICD-10-CM | POA: Diagnosis not present

## 2023-02-07 DIAGNOSIS — K219 Gastro-esophageal reflux disease without esophagitis: Secondary | ICD-10-CM | POA: Diagnosis present

## 2023-02-07 DIAGNOSIS — Z811 Family history of alcohol abuse and dependence: Secondary | ICD-10-CM | POA: Diagnosis not present

## 2023-02-07 DIAGNOSIS — N390 Urinary tract infection, site not specified: Secondary | ICD-10-CM | POA: Diagnosis not present

## 2023-02-07 DIAGNOSIS — Z803 Family history of malignant neoplasm of breast: Secondary | ICD-10-CM | POA: Diagnosis not present

## 2023-02-07 DIAGNOSIS — J4489 Other specified chronic obstructive pulmonary disease: Secondary | ICD-10-CM | POA: Diagnosis present

## 2023-02-07 DIAGNOSIS — D696 Thrombocytopenia, unspecified: Secondary | ICD-10-CM | POA: Diagnosis present

## 2023-02-07 DIAGNOSIS — A419 Sepsis, unspecified organism: Secondary | ICD-10-CM | POA: Diagnosis not present

## 2023-02-07 DIAGNOSIS — Z825 Family history of asthma and other chronic lower respiratory diseases: Secondary | ICD-10-CM | POA: Diagnosis not present

## 2023-02-07 DIAGNOSIS — A4151 Sepsis due to Escherichia coli [E. coli]: Secondary | ICD-10-CM | POA: Diagnosis present

## 2023-02-07 DIAGNOSIS — D72829 Elevated white blood cell count, unspecified: Secondary | ICD-10-CM | POA: Diagnosis present

## 2023-02-07 DIAGNOSIS — E860 Dehydration: Secondary | ICD-10-CM | POA: Diagnosis present

## 2023-02-07 DIAGNOSIS — Z8616 Personal history of COVID-19: Secondary | ICD-10-CM | POA: Diagnosis not present

## 2023-02-07 DIAGNOSIS — F419 Anxiety disorder, unspecified: Secondary | ICD-10-CM | POA: Diagnosis present

## 2023-02-07 DIAGNOSIS — F32A Depression, unspecified: Secondary | ICD-10-CM | POA: Diagnosis present

## 2023-02-07 DIAGNOSIS — R7881 Bacteremia: Secondary | ICD-10-CM | POA: Diagnosis present

## 2023-02-07 DIAGNOSIS — Z87891 Personal history of nicotine dependence: Secondary | ICD-10-CM | POA: Diagnosis not present

## 2023-02-07 DIAGNOSIS — E869 Volume depletion, unspecified: Secondary | ICD-10-CM | POA: Diagnosis present

## 2023-02-07 DIAGNOSIS — D649 Anemia, unspecified: Secondary | ICD-10-CM | POA: Diagnosis present

## 2023-02-07 DIAGNOSIS — Z90710 Acquired absence of both cervix and uterus: Secondary | ICD-10-CM | POA: Diagnosis not present

## 2023-02-07 DIAGNOSIS — R0602 Shortness of breath: Secondary | ICD-10-CM | POA: Diagnosis present

## 2023-02-07 DIAGNOSIS — B9620 Unspecified Escherichia coli [E. coli] as the cause of diseases classified elsewhere: Secondary | ICD-10-CM | POA: Diagnosis present

## 2023-02-07 DIAGNOSIS — E119 Type 2 diabetes mellitus without complications: Secondary | ICD-10-CM

## 2023-02-07 DIAGNOSIS — F429 Obsessive-compulsive disorder, unspecified: Secondary | ICD-10-CM | POA: Diagnosis present

## 2023-02-07 DIAGNOSIS — Z794 Long term (current) use of insulin: Secondary | ICD-10-CM | POA: Diagnosis not present

## 2023-02-07 DIAGNOSIS — Z9981 Dependence on supplemental oxygen: Secondary | ICD-10-CM | POA: Diagnosis not present

## 2023-02-07 DIAGNOSIS — E662 Morbid (severe) obesity with alveolar hypoventilation: Secondary | ICD-10-CM | POA: Diagnosis present

## 2023-02-07 DIAGNOSIS — R6520 Severe sepsis without septic shock: Secondary | ICD-10-CM | POA: Diagnosis present

## 2023-02-07 HISTORY — DX: Bacteremia: R78.81

## 2023-02-07 LAB — URINALYSIS, ROUTINE W REFLEX MICROSCOPIC
Glucose, UA: NEGATIVE mg/dL
Hgb urine dipstick: NEGATIVE
Ketones, ur: NEGATIVE mg/dL
Nitrite: POSITIVE — AB
Protein, ur: 30 mg/dL — AB
Specific Gravity, Urine: 1.016 (ref 1.005–1.030)
pH: 5 (ref 5.0–8.0)

## 2023-02-07 LAB — GLUCOSE, CAPILLARY
Glucose-Capillary: 72 mg/dL (ref 70–99)
Glucose-Capillary: 89 mg/dL (ref 70–99)
Glucose-Capillary: 100 mg/dL — ABNORMAL HIGH (ref 70–99)

## 2023-02-07 LAB — CBC
HCT: 45.3 % (ref 36.0–46.0)
HCT: 46.8 % — ABNORMAL HIGH (ref 36.0–46.0)
Hemoglobin: 15.2 g/dL — ABNORMAL HIGH (ref 12.0–15.0)
Hemoglobin: 15.2 g/dL — ABNORMAL HIGH (ref 12.0–15.0)
MCH: 31.1 pg (ref 26.0–34.0)
MCH: 31.2 pg (ref 26.0–34.0)
MCHC: 33.6 g/dL (ref 30.0–36.0)
MCHC: 32.5 g/dL (ref 30.0–36.0)
MCV: 92.8 fL (ref 80.0–100.0)
MCV: 96.1 fL (ref 80.0–100.0)
Platelets: 125 10*3/uL — ABNORMAL LOW (ref 150–400)
Platelets: 124 10*3/uL — ABNORMAL LOW (ref 150–400)
RBC: 4.88 MIL/uL (ref 3.87–5.11)
RBC: 4.87 MIL/uL (ref 3.87–5.11)
RDW: 13.6 % (ref 11.5–15.5)
RDW: 13.9 % (ref 11.5–15.5)
WBC: 26.2 10*3/uL — ABNORMAL HIGH (ref 4.0–10.5)
WBC: 19.3 10*3/uL — ABNORMAL HIGH (ref 4.0–10.5)
nRBC: 0 % (ref 0.0–0.2)
nRBC: 0 % (ref 0.0–0.2)

## 2023-02-07 LAB — BLOOD CULTURE ID PANEL (REFLEXED) - BCID2

## 2023-02-07 LAB — BASIC METABOLIC PANEL
Anion gap: 8 (ref 5–15)
BUN: 8 mg/dL (ref 8–23)
CO2: 24 mmol/L (ref 22–32)
Calcium: 8.5 mg/dL — ABNORMAL LOW (ref 8.9–10.3)
Chloride: 105 mmol/L (ref 98–111)
Creatinine, Ser: 0.71 mg/dL (ref 0.44–1.00)
GFR, Estimated: >60 mL/min (ref >60)
Glucose, Bld: 109 mg/dL — ABNORMAL HIGH (ref 70–99)
Potassium: 3.5 mmol/L (ref 3.5–5.1)
Sodium: 137 mmol/L (ref 135–145)

## 2023-02-07 LAB — LACTIC ACID, PLASMA: Lactic Acid, Venous: 1.3 mmol/L (ref 0.5–1.9)

## 2023-02-07 LAB — BRAIN NATRIURETIC PEPTIDE: B Natriuretic Peptide: 277.8 pg/mL — ABNORMAL HIGH (ref 0.0–100.0)

## 2023-02-07 LAB — CREATININE, SERUM
Creatinine, Ser: 0.57 mg/dL (ref 0.44–1.00)
GFR, Estimated: >60 mL/min (ref >60)

## 2023-02-07 LAB — HIV ANTIBODY (ROUTINE TESTING W REFLEX): HIV Screen 4th Generation wRfx: NONREACTIVE

## 2023-02-07 LAB — TROPONIN I (HIGH SENSITIVITY)
Troponin I (High Sensitivity): 25 ng/L — ABNORMAL HIGH (ref <18)
Troponin I (High Sensitivity): 26 ng/L — ABNORMAL HIGH (ref <18)

## 2023-02-07 MED ORDER — ATORVASTATIN CALCIUM 10 MG PO TABS
10.0000 mg | ORAL_TABLET | Freq: Every day | ORAL | Status: DC
  Administered 2023-02-08 – 2023-02-10 (×3): 10 mg via ORAL
  Filled 2023-02-07 (×3): qty 1

## 2023-02-07 MED ORDER — IOHEXOL 350 MG/ML SOLN
75.0000 mL | Freq: Once | INTRAVENOUS | Status: AC | PRN
  Administered 2023-02-07: 100 mL via INTRAVENOUS

## 2023-02-07 MED ORDER — INSULIN ASPART 100 UNIT/ML IJ SOLN: 0.0000 [IU] | Freq: Every day | INTRAMUSCULAR | Status: DC

## 2023-02-07 MED ORDER — SODIUM CHLORIDE 0.9 % IV BOLUS
1000.0000 mL | Freq: Once | INTRAVENOUS | Status: AC
  Administered 2023-02-07: 1000 mL via INTRAVENOUS

## 2023-02-07 MED ORDER — ONDANSETRON HCL 4 MG/2ML IJ SOLN: 4.0000 mg | Freq: Four times a day (QID) | INTRAMUSCULAR | Status: DC | PRN

## 2023-02-07 MED ORDER — ONDANSETRON HCL 4 MG PO TABS: 4.0000 mg | ORAL_TABLET | Freq: Four times a day (QID) | ORAL | Status: DC | PRN

## 2023-02-07 MED ORDER — TRAZODONE HCL 50 MG PO TABS
150.0000 mg | ORAL_TABLET | Freq: Every day | ORAL | Status: DC
  Administered 2023-02-07 – 2023-02-09 (×3): 150 mg via ORAL
  Filled 2023-02-07 (×3): qty 1

## 2023-02-07 MED ORDER — SODIUM CHLORIDE (PF) 0.9 % IJ SOLN
INTRAMUSCULAR | Status: AC
  Administered 2023-02-07: 10 mL
  Filled 2023-02-07: qty 50

## 2023-02-07 MED ORDER — SODIUM CHLORIDE 0.9 % IV SOLN: 2.0000 g | INTRAVENOUS | Status: DC

## 2023-02-07 MED ORDER — SODIUM CHLORIDE 0.9 % IV SOLN
2.0000 g | INTRAVENOUS | Status: DC
  Administered 2023-02-07 – 2023-02-08 (×2): 2 g via INTRAVENOUS
  Filled 2023-02-07 (×2): qty 20

## 2023-02-07 MED ORDER — INSULIN ASPART 100 UNIT/ML IJ SOLN
0.0000 [IU] | Freq: Three times a day (TID) | INTRAMUSCULAR | Status: DC
  Administered 2023-02-09 – 2023-02-10 (×2): 2 [IU] via SUBCUTANEOUS

## 2023-02-07 MED ORDER — ACETAMINOPHEN 325 MG PO TABS
650.0000 mg | ORAL_TABLET | Freq: Four times a day (QID) | ORAL | Status: DC | PRN
  Administered 2023-02-07: 650 mg via ORAL
  Filled 2023-02-07: qty 2

## 2023-02-07 MED ORDER — PANTOPRAZOLE SODIUM 40 MG PO TBEC
40.0000 mg | DELAYED_RELEASE_TABLET | Freq: Every day | ORAL | Status: DC
  Administered 2023-02-07 – 2023-02-10 (×4): 40 mg via ORAL
  Filled 2023-02-07 (×4): qty 1

## 2023-02-07 MED ORDER — GABAPENTIN 100 MG PO CAPS
100.0000 mg | ORAL_CAPSULE | Freq: Three times a day (TID) | ORAL | Status: DC
  Administered 2023-02-07 – 2023-02-10 (×9): 100 mg via ORAL
  Filled 2023-02-07 (×9): qty 1

## 2023-02-07 MED ORDER — ENOXAPARIN SODIUM 40 MG/0.4ML IJ SOSY
40.0000 mg | PREFILLED_SYRINGE | Freq: Every day | INTRAMUSCULAR | Status: DC
  Administered 2023-02-07 – 2023-02-08 (×2): 40 mg via SUBCUTANEOUS
  Filled 2023-02-07 (×2): qty 0.4

## 2023-02-07 MED ORDER — SODIUM CHLORIDE 0.9 % IV SOLN: 1.0000 g | INTRAVENOUS | Status: DC

## 2023-02-07 MED ORDER — DOCUSATE SODIUM 100 MG PO CAPS
100.0000 mg | ORAL_CAPSULE | Freq: Two times a day (BID) | ORAL | Status: DC
  Administered 2023-02-07 – 2023-02-10 (×7): 100 mg via ORAL
  Filled 2023-02-07 (×7): qty 1

## 2023-02-07 MED ORDER — SODIUM CHLORIDE 0.9 % IV SOLN: INTRAVENOUS | Status: DC

## 2023-02-07 MED ORDER — POLYETHYLENE GLYCOL 3350 17 G PO PACK: 17.0000 g | PACK | Freq: Every day | ORAL | Status: DC | PRN

## 2023-02-07 MED ORDER — ALPRAZOLAM 0.5 MG PO TABS
1.0000 mg | ORAL_TABLET | Freq: Three times a day (TID) | ORAL | Status: DC | PRN
  Administered 2023-02-09: 1 mg via ORAL
  Filled 2023-02-07: qty 2

## 2023-02-07 MED ORDER — SODIUM CHLORIDE 0.9 % IV SOLN
1.0000 g | Freq: Once | INTRAVENOUS | Status: AC
  Administered 2023-02-07: 1 g via INTRAVENOUS
  Filled 2023-02-07: qty 10

## 2023-02-07 MED ORDER — ACETAMINOPHEN 650 MG RE SUPP: 650.0000 mg | Freq: Four times a day (QID) | RECTAL | Status: DC | PRN

## 2023-02-07 NOTE — H&P (Addendum)
History and Physical  [name] [name] [id number] DOB: 1956-08-30 DOA: 02/06/2023  PCP: Iona Beard, MD   Chief Complaint: Shortness of breath  HPI: [name] [name] is a 67 y.o. female with medical history significant for obesity, anxiety, obstructive sleep apnea, insulin-dependent type 2 diabetes and recent COVID infection presented to the emergency department with complaints of shortness of breath.  She was found to be saturating about 88% on room air, she was also quite hypotensive.  She was diagnosed with COVID about 10 days ago, since that time she has been short of breath.  She tells me that she has generally been tired, denies any chest pain, denies any cough, denies any urinary symptoms such as urgency frequency or dysuria.  ED Course: On evaluation in the ER, initial blood pressure was 83/56, saturating 86% on room air.  She has been afebrile.  She was given IV fluids and blood pressure has improved, currently 103/53 and she is resting comfortably on room air.  Lab work was done and revealed white blood cell count of 26,000, hemoglobin of 15, platelets 125.  Glucose 109, troponin 25, 26 and BNP 278.  Urinalysis consistent with infection, she was given IV Rocephin and admitted to the hospitalist service.  Review of Systems: Please see HPI for pertinent positives and negatives. A complete 10 system review of systems are otherwise negative.  Past Medical History:  Diagnosis Date   Anxiety    Asthma    Chronic, with restrictive component 2/2 obesity   Depression    GERD (gastroesophageal reflux disease)    Headache    Insomnia    Obesity    On home oxygen therapy    "3L at night only when I get sick" (04/30/2014)   Shortness of breath dyspnea    Sleep apnea    "couldn't afford mask so I didn't get it" (04/30/2014)   Past Surgical History:  Procedure Laterality Date   APPENDECTOMY  ~ 2002   VAGINAL HYSTERECTOMY  ~ 1978    Social History:  reports that she quit  smoking about 10 years ago. Her smoking use included cigarettes. She has never used smokeless tobacco. She reports that she does not drink alcohol and does not use drugs.   Allergies  Allergen Reactions   Ibuprofen Anaphylaxis, Hives and Swelling    Throat swells   Clonazepam Other (See Comments)    Dizziness     Family History  Problem Relation Age of Onset   Breast cancer Mother    Alcohol abuse Mother    Emphysema Mother        smoked   Asthma Mother    Breast cancer Maternal Aunt    Breast cancer Maternal Grandmother      Prior to Admission medications   Medication Sig Start Date End Date Taking? Authorizing Provider  acetaminophen (TYLENOL) 500 MG tablet Take 1,000 mg by mouth every 6 (six) hours as needed (for pain OR back pain).     [provider]  albuterol (VENTOLIN HFA) 108 (90 Base) MCG/ACT inhaler Inhale 2 puffs into the lungs every 6 (six) hours as needed for wheezing or shortness of breath. 06/16/20   Andrew Au, MD  ALPRAZolam Duanne Moron) 1 MG tablet TAKE 1/2 TO 1 TABLET(0.5 TO 1 MG) BY MOUTH THREE TIMES DAILY AS NEEDED FOR ANXIETY 01/18/23   Donnal Moat T, PA-C  atorvastatin (LIPITOR) 10 MG tablet TAKE 1 TABLET(10 MG) BY MOUTH DAILY 06/17/22   Iona Beard, MD  Cholecalciferol  20 MCG (800 UNIT) TABS Take 1 tablet by mouth daily. 08/31/22 08/26/23  Iona Beard, MD  fluticasone furoate-vilanterol (BREO ELLIPTA) 100-25 MCG/INH AEPB Inhale 1 puff into the lungs daily. 06/16/20   Andrew Au, MD  gabapentin (NEURONTIN) 100 MG capsule TAKE 1 CAPSULE(100 MG) BY MOUTH THREE TIMES DAILY 03/14/22   Trula Slade, DPM  Insulin Pen Needle (PEN NEEDLES) 32G X 4 MM MISC 1 Needle by Does not apply route daily at 6 (six) AM. 12/12/22   Virl Axe, MD  pantoprazole (PROTONIX) 40 MG tablet TAKE 1 TABLET(40 MG) BY MOUTH DAILY 11/28/22   Iona Beard, MD  sertraline (ZOLOFT) 100 MG tablet TAKE 3 TABLETS(300 MG) BY MOUTH DAILY 12/13/22   Donnal Moat T, PA-C   traZODone (DESYREL) 150 MG tablet Take 1 tablet (150 mg total) by mouth at bedtime. 01/13/23   Addison Lank, PA-C    Physical Exam: BP 98/67 (BP Location: Right Arm)   Pulse 78   Temp 98.3 F (36.8 C) (Oral)   Resp 20   Ht 5\' 8"  (1.727 m)   Wt 104.3 kg   SpO2 95%   BMI 34.97 kg/m   General:  Alert, oriented, calm, in no acute distress, sitting up in a chair in her room on room air, looks very comfortable. Eyes: EOMI, clear conjuctivae, white sclerea Neck: supple, no masses, trachea mildline  Cardiovascular: RRR, no murmurs or rubs, no peripheral edema  Respiratory: clear to auscultation bilaterally, no wheezes, no crackles  Abdomen: soft, nontender, nondistended, normal bowel tones heard  Skin: dry, no rashes  Musculoskeletal: no joint effusions, normal range of motion  Psychiatric: appropriate affect, normal speech  Neurologic: extraocular muscles intact, clear speech, moving all extremities with intact sensorium          Labs on Admission:  Basic Metabolic Panel: Recent Labs  Lab 02/06/23 2354  NA 137  K 3.5  CL 105  CO2 24  GLUCOSE 109*  BUN 8  CREATININE 0.71  CALCIUM 8.5*   Liver Function Tests: No results for input(s): "AST", "ALT", "ALKPHOS", "BILITOT", "PROT", "ALBUMIN" in the last 168 hours. No results for input(s): "LIPASE", "AMYLASE" in the last 168 hours. No results for input(s): "AMMONIA" in the last 168 hours. CBC: Recent Labs  Lab 02/06/23 2354  WBC 26.2*  HGB 15.2*  HCT 45.3  MCV 92.8  PLT 125*   Cardiac Enzymes: No results for input(s): "CKTOTAL", "CKMB", "CKMBINDEX", "TROPONINI" in the last 168 hours.  BNP (last 3 results) Recent Labs    02/07/23 0247  BNP 277.8*    ProBNP (last 3 results) No results for input(s): "PROBNP" in the last 8760 hours.  CBG: No results for input(s): "GLUCAP" in the last 168 hours.  Radiological Exams on Admission: CT Angio Chest PE W/Cm &/Or Wo Cm  Result Date: 02/07/2023 CLINICAL DATA:   Pulmonary embolism (PE) suspected, high prob EXAM: CT ANGIOGRAPHY CHEST WITH CONTRAST TECHNIQUE: Multidetector CT imaging of the chest was performed using the standard protocol during bolus administration of intravenous contrast. Multiplanar CT image reconstructions and MIPs were obtained to evaluate the vascular anatomy. RADIATION DOSE REDUCTION: This exam was performed according to the departmental dose-optimization program which includes automated exposure control, adjustment of the mA and/or kV according to patient size and/or use of iterative reconstruction technique. CONTRAST:  165mL OMNIPAQUE IOHEXOL 350 MG/ML SOLN COMPARISON:  12/02/2020 FINDINGS: Cardiovascular: No filling defects in the pulmonary arteries to suggest pulmonary emboli. Heart is normal size. Coronary artery and  aortic calcifications. No aneurysm. Mediastinum/Nodes: No mediastinal, hilar, or axillary adenopathy. Trachea and esophagus are unremarkable. Thyroid unremarkable. Lungs/Pleura: No confluent opacity or effusion. Upper Abdomen: No acute findings Musculoskeletal: Chest wall soft tissues are unremarkable. No acute bony abnormality. Review of the MIP images confirms the above findings. IMPRESSION: No evidence of pulmonary embolus. No acute cardiopulmonary disease. Scattered coronary artery calcifications. Aortic Atherosclerosis (ICD10-I70.0). Electronically Signed   By: Rolm Baptise M.D.   On: 02/07/2023 01:43   DG Chest 2 View  Result Date: 02/06/2023 CLINICAL DATA:  Shortness of breath. EXAM: CHEST - 2 VIEW COMPARISON:  Chest radiograph dated 06/05/2022. FINDINGS: No focal consolidation, pleural effusion, or pneumothorax. The cardiac silhouette is within normal limits. No acute osseous pathology. IMPRESSION: No active cardiopulmonary disease. Electronically Signed   By: Anner Crete M.D.   On: 02/06/2023 22:45    Assessment/Plan Principal Problem:   Sepsis secondary to UTI-she is meeting criteria with hypotension,  leukocytosis, no lactate is normal at 1.3.  Source of infection is urinary tract infection. -Observation admission to telemetry unit -Continue empiric IV Rocephin -Follow blood cultures, urine culture was not obtained -Received 1 L bolus in the ER, still hypotensive likely due to volume depletion, will aggressively rehydrate  Active Problems:   GERD (gastroesophageal reflux disease)-continue oral PPI  Obesity-BMI 35, complicating all aspects of care   Depression   OSA (obstructive sleep apnea)   OCD (obsessive compulsive disorder)   Leukocytosis and hypotension-due to her urinary tract infection and sepsis, treating as above   DM2 (diabetes mellitus, type 2)-diabetic diet when eating, moderate dose sliding scale ordered  Thrombocytopenia and Anemia - noted, unclear chronicity, will trend with labs and will need to be followed up outpatient. Could be secondary to current sepsis.  DVT prophylaxis: Lovenox     Code Status: Full Code  Consults called: None  Admission status: Observation  Time spent: 49 minutes  Dietrich Ke Neva Seat MD [facility] Pager [phone]  If 7PM-7AM, please contact night-coverage [email or web address] Password [facility]  02/07/2023, 9:26 AM

## 2023-02-07 NOTE — ED Notes (Signed)
Patient returned from CT

## 2023-02-07 NOTE — Plan of Care (Signed)
  Problem: Education: Goal: Knowledge of General Education information will improve Description: Including pain rating scale, medication(s)/side effects and non-pharmacologic comfort measures Outcome: Completed/Met   

## 2023-02-07 NOTE — ED Notes (Addendum)
Patient transported to X-Ray 

## 2023-02-07 NOTE — ED Notes (Signed)
Patient transported to CT 

## 2023-02-07 NOTE — Progress Notes (Signed)
PHARMACY NOTE:  ANTIMICROBIAL RENAL DOSAGE ADJUSTMENT  Current antimicrobial regimen includes a mismatch between antimicrobial dosage and estimated renal function.  As per policy approved by the Pharmacy & Therapeutics and Medical Executive Committees, the antimicrobial dosage will be adjusted accordingly.  Current antimicrobial dosage:  ceftriaxone 2g IV q24h   Indication: UTI   Renal Function:  Estimated Creatinine Clearance: 86.3 mL/min (by C-G formula based on SCr of 0.71 mg/dL). []      On intermittent HD, scheduled: []      On CRRT    Antimicrobial dosage has been changed to:  ceftriaxone 1g IV q24h  Additional comments:   Thank you for allowing pharmacy to be a part of this patient's care.  Eliseo Gum, PharmD PGY1 Pharmacy Resident   02/07/2023  10:33 AM

## 2023-02-07 NOTE — Progress Notes (Signed)
PHARMACY - PHYSICIAN COMMUNICATION CRITICAL VALUE ALERT - BLOOD CULTURE IDENTIFICATION (BCID)  [name] [name] is an 67 y.o. female who presented to [facility] on 02/06/2023 with a chief complaint of SOB  Assessment:  2/4 Ecoli, no Resistance  Name of physician (or Provider) Contacted: Clarene Essex  Current antibiotics: CTX 1gm  Changes to prescribed antibiotics recommended:  Ceftriaxone 2gm IV q24h  Results for orders placed or performed during the hospital encounter of 02/06/23  Blood Culture ID Panel (Reflexed) (Collected: 02/07/2023  5:59 AM)  Result Value Ref Range   Enterococcus faecalis NOT DETECTED NOT DETECTED   Enterococcus Faecium NOT DETECTED NOT DETECTED   Listeria monocytogenes NOT DETECTED NOT DETECTED   Staphylococcus species NOT DETECTED NOT DETECTED   Staphylococcus aureus (BCID) NOT DETECTED NOT DETECTED   Staphylococcus epidermidis NOT DETECTED NOT DETECTED   Staphylococcus lugdunensis NOT DETECTED NOT DETECTED   Streptococcus species NOT DETECTED NOT DETECTED   Streptococcus agalactiae NOT DETECTED NOT DETECTED   Streptococcus pneumoniae NOT DETECTED NOT DETECTED   Streptococcus pyogenes NOT DETECTED NOT DETECTED   A.calcoaceticus-baumannii NOT DETECTED NOT DETECTED   Bacteroides fragilis NOT DETECTED NOT DETECTED   Enterobacterales DETECTED (A) NOT DETECTED   Enterobacter cloacae complex NOT DETECTED NOT DETECTED   Escherichia coli DETECTED (A) NOT DETECTED   Klebsiella aerogenes NOT DETECTED NOT DETECTED   Klebsiella oxytoca NOT DETECTED NOT DETECTED   Klebsiella pneumoniae NOT DETECTED NOT DETECTED   Proteus species NOT DETECTED NOT DETECTED   Salmonella species NOT DETECTED NOT DETECTED   Serratia marcescens NOT DETECTED NOT DETECTED   Haemophilus influenzae NOT DETECTED NOT DETECTED   Neisseria meningitidis NOT DETECTED NOT DETECTED   Pseudomonas aeruginosa NOT DETECTED NOT DETECTED   Stenotrophomonas maltophilia NOT DETECTED NOT DETECTED   Candida  albicans NOT DETECTED NOT DETECTED   Candida auris NOT DETECTED NOT DETECTED   Candida glabrata NOT DETECTED NOT DETECTED   Candida krusei NOT DETECTED NOT DETECTED   Candida parapsilosis NOT DETECTED NOT DETECTED   Candida tropicalis NOT DETECTED NOT DETECTED   Cryptococcus neoformans/gattii NOT DETECTED NOT DETECTED   CTX-M ESBL NOT DETECTED NOT DETECTED   Carbapenem resistance IMP NOT DETECTED NOT DETECTED   Carbapenem resistance KPC NOT DETECTED NOT DETECTED   Carbapenem resistance NDM NOT DETECTED NOT DETECTED   Carbapenem resist OXA 48 LIKE NOT DETECTED NOT DETECTED   Carbapenem resistance VIM NOT DETECTED NOT DETECTED    Dolly Rias RPh 02/07/2023, 10:53 PM

## 2023-02-08 DIAGNOSIS — A419 Sepsis, unspecified organism: Secondary | ICD-10-CM | POA: Diagnosis not present

## 2023-02-08 DIAGNOSIS — N390 Urinary tract infection, site not specified: Secondary | ICD-10-CM | POA: Diagnosis not present

## 2023-02-08 LAB — BASIC METABOLIC PANEL
Anion gap: 5 (ref 5–15)
BUN: 9 mg/dL (ref 8–23)
CO2: 25 mmol/L (ref 22–32)
Calcium: 7.9 mg/dL — ABNORMAL LOW (ref 8.9–10.3)
Chloride: 107 mmol/L (ref 98–111)
Creatinine, Ser: 0.65 mg/dL (ref 0.44–1.00)
GFR, Estimated: >60 mL/min (ref >60)
Glucose, Bld: 96 mg/dL (ref 70–99)
Potassium: 3.6 mmol/L (ref 3.5–5.1)
Sodium: 137 mmol/L (ref 135–145)

## 2023-02-08 LAB — CBC
HCT: 40.2 % (ref 36.0–46.0)
Hemoglobin: 13.3 g/dL (ref 12.0–15.0)
MCH: 31.5 pg (ref 26.0–34.0)
MCHC: 33.1 g/dL (ref 30.0–36.0)
MCV: 95.3 fL (ref 80.0–100.0)
Platelets: UNDETERMINED 10*3/uL (ref 150–400)
RBC: 4.22 MIL/uL (ref 3.87–5.11)
RDW: 13.6 % (ref 11.5–15.5)
WBC: 8.4 10*3/uL (ref 4.0–10.5)
nRBC: 0 % (ref 0.0–0.2)

## 2023-02-08 LAB — GLUCOSE, CAPILLARY
Glucose-Capillary: 94 mg/dL (ref 70–99)
Glucose-Capillary: 97 mg/dL (ref 70–99)
Glucose-Capillary: 94 mg/dL (ref 70–99)
Glucose-Capillary: 110 mg/dL — ABNORMAL HIGH (ref 70–99)

## 2023-02-08 LAB — HEMOGLOBIN A1C
Hgb A1c MFr Bld: 5.6 % (ref 4.8–5.6)
Mean Plasma Glucose: 114 mg/dL

## 2023-02-08 MED ORDER — SERTRALINE HCL 100 MG PO TABS
300.0000 mg | ORAL_TABLET | Freq: Every day | ORAL | Status: DC
  Administered 2023-02-08 – 2023-02-10 (×3): 300 mg via ORAL
  Filled 2023-02-08 (×3): qty 3

## 2023-02-08 NOTE — Progress Notes (Signed)
PROGRESS NOTE    [name] [name]  [id number] DOB: 1956/03/05 DOA: 02/06/2023 PCP: Iona Beard, MD    Brief Narrative:  [name] [name] is a 67 y.o. female with medical history significant for obesity, anxiety, obstructive sleep apnea, insulin-dependent type 2 diabetes and recent COVID infection presented to the emergency department with complaints of shortness of breath.  She was found to be saturating about 88% on room air, she was also quite hypotensive.  She was diagnosed with COVID about 10 days ago, since that time she has been short of breath.  She tells me that she has generally been tired    Assessment and Plan: Sepsis secondary to UTI and bacteremia - hypotension, leukocytosis-  Source of infection is urinary tract infection. -Continue empiric IV Rocephin -Follow blood cultures, urine culture was not obtained but will reorder-- BC With e coli      GERD (gastroesophageal reflux disease) -continue oral PPI   Obesity-BMI 35, complicating all aspects of care Estimated body mass index is 34.97 kg/m as calculated from the following:   Height as of this encounter: 5\' 8"  (1.727 m).   Weight as of this encounter: 104.3 kg.     Depression -resume home meds    OSA (obstructive sleep apnea)    Leukocytosis and hypotension -due to her urinary tract infection and sepsis, treating as above    DM2 (diabetes mellitus, type 2)-diabetic diet when eating, moderate dose sliding scale ordered   Thrombocytopenia and Anemia - noted, unclear chronicity, will trend with labs and will need to be followed up outpatient. Could be secondary to current sepsis.   DVT prophylaxis: enoxaparin (LOVENOX) injection 40 mg Start: 02/07/23 1000 SCDs Start: 02/07/23 [id number]    Code Status: Full Code   Disposition Plan:  Level of care: Telemetry Status is: Inpatient Remains inpatient appropriate because: needs IV abx and cultures    Consultants:  none   Subjective: Overall  feeling better  Objective: Vitals:   02/07/23 1326 02/07/23 1755 02/07/23 2125 02/08/23 0624  BP: 108/70 100/68 114/81 107/79  Pulse: 97 74 80 85  Resp: (!) 24 20    Temp: 97.9 F (36.6 C) 98.2 F (36.8 C) 99.1 F (37.3 C) 100.2 F (37.9 C)  TempSrc: Oral Oral Oral Oral  SpO2: 95% 96% 96% (!) 88%  Weight:      Height:        Intake/Output Summary (Last 24 hours) at 02/08/2023 [id number] Last data filed at 02/08/2023 0645 Gross per 24 hour  Intake 1047 ml  Output 1000 ml  Net 47 ml   Filed Weights   02/06/23 2211  Weight: 104.3 kg    Examination:   General: Appearance:    Obese female in no acute distress     Lungs:     Diminished, respirations unlabored  Heart:    Normal heart rate.    MS:   All extremities are intact.    Neurologic:   Awake, alert, oriented x 3. No apparent focal neurological           defect.        Data Reviewed: I have personally reviewed following labs and imaging studies  CBC: Recent Labs  Lab 02/06/23 2354 02/07/23 0947 02/08/23 0429  WBC 26.2* 19.3* 8.4  HGB 15.2* 15.2* 13.3  HCT 45.3 46.8* 40.2  MCV 92.8 96.1 95.3  PLT 125* 124* PLATELET CLUMPS NOTED ON SMEAR, UNABLE TO ESTIMATE   Basic Metabolic Panel: Recent Labs  Lab 02/06/23 2354  02/07/23 0947 02/08/23 0429  NA 137  --  137  K 3.5  --  3.6  CL 105  --  107  CO2 24  --  25  GLUCOSE 109*  --  96  BUN 8  --  9  CREATININE 0.71 0.57 0.65  CALCIUM 8.5*  --  7.9*   GFR: Estimated Creatinine Clearance: 86.3 mL/min (by C-G formula based on SCr of 0.65 mg/dL). Liver Function Tests: No results for input(s): "AST", "ALT", "ALKPHOS", "BILITOT", "PROT", "ALBUMIN" in the last 168 hours. No results for input(s): "LIPASE", "AMYLASE" in the last 168 hours. No results for input(s): "AMMONIA" in the last 168 hours. Coagulation Profile: No results for input(s): "INR", "PROTIME" in the last 168 hours. Cardiac Enzymes: No results for input(s): "CKTOTAL", "CKMB", "CKMBINDEX",  "TROPONINI" in the last 168 hours. BNP (last 3 results) No results for input(s): "PROBNP" in the last 8760 hours. HbA1C: Recent Labs    02/07/23 0947  HGBA1C 5.6   CBG: Recent Labs  Lab 02/07/23 1133 02/07/23 1751 02/07/23 2129 02/08/23 0754  GLUCAP 72 89 100* 94   Lipid Profile: No results for input(s): "CHOL", "HDL", "LDLCALC", "TRIG", "CHOLHDL", "LDLDIRECT" in the last 72 hours. Thyroid Function Tests: No results for input(s): "TSH", "T4TOTAL", "FREET4", "T3FREE", "THYROIDAB" in the last 72 hours. Anemia Panel: No results for input(s): "VITAMINB12", "FOLATE", "FERRITIN", "TIBC", "IRON", "RETICCTPCT" in the last 72 hours. Sepsis Labs: Recent Labs  Lab 02/07/23 0100  LATICACIDVEN 1.3    Recent Results (from the past 240 hour(s))  Blood culture (routine x 2)     Status: None (Preliminary result)   Collection Time: 02/07/23  5:59 AM   Specimen: BLOOD LEFT HAND  Result Value Ref Range Status   Specimen Description   Final    BLOOD LEFT HAND Performed at [facility], [place] [address] [place], [place] [address]    Special Requests   Final    BOTTLES DRAWN AEROBIC AND ANAEROBIC Blood Culture results may not be optimal due to an excessive volume of blood received in culture bottles Performed at [facility] [address] [place], [place] [address]    Culture  Setup Time   Final    GRAM NEGATIVE RODS IN BOTH AEROBIC AND ANAEROBIC BOTTLES CRITICAL VALUE NOTED.  VALUE IS CONSISTENT WITH PREVIOUSLY REPORTED AND CALLED VALUE. Performed at [facility], [place] [address] [place], [place] [address]    Culture GRAM NEGATIVE RODS  Final   Report Status PENDING  Incomplete  Blood culture (routine x 2)     Status: None (Preliminary result)   Collection Time: 02/07/23  5:59 AM   Specimen: BLOOD  Result Value Ref Range Status   Specimen Description   Final    BLOOD SITE NOT SPECIFIED Performed at [facility] [address] [place], [place] [address]    Special Requests   Final    BOTTLES DRAWN AEROBIC AND ANAEROBIC Blood Culture results may not be optimal due to an excessive volume of blood received in culture bottles Performed at [facility] [address] [place], [place] [address]    Culture  Setup Time   Final    GRAM NEGATIVE RODS IN BOTH AEROBIC AND ANAEROBIC BOTTLES CRITICAL RESULT CALLED TO, READ BACK BY AND VERIFIED WITH: PHARMD LEANN POINDEXTER ON 02/07/23 @ 1741 BY DRT Performed at [facility], [place] [address], [place],  [address]    Culture Lonell Grandchild  NEGATIVE RODS  Final   Report Status PENDING  Incomplete  Blood Culture ID Panel (Reflexed)     Status: Abnormal   Collection Time: 02/07/23  5:59 AM  Result Value Ref Range Status   Enterococcus faecalis NOT DETECTED NOT DETECTED Final   Enterococcus Faecium NOT DETECTED NOT DETECTED Final   Listeria monocytogenes NOT DETECTED NOT DETECTED Final   Staphylococcus species NOT DETECTED NOT DETECTED Final   Staphylococcus aureus (BCID) NOT DETECTED NOT DETECTED Final   Staphylococcus epidermidis NOT DETECTED NOT DETECTED Final   Staphylococcus lugdunensis NOT DETECTED NOT DETECTED Final   Streptococcus species NOT DETECTED NOT DETECTED Final   Streptococcus agalactiae NOT DETECTED NOT DETECTED Final   Streptococcus pneumoniae NOT DETECTED NOT DETECTED Final   Streptococcus pyogenes NOT DETECTED NOT DETECTED Final   A.calcoaceticus-baumannii NOT DETECTED NOT DETECTED Final   Bacteroides fragilis NOT DETECTED NOT DETECTED Final   Enterobacterales DETECTED (A) NOT DETECTED Final    Comment: Enterobacterales represent a large order of gram negative bacteria, not a single organism. CRITICAL RESULT CALLED TO, READ BACK BY AND VERIFIED WITH: PHARMD LEANN POINDEXTER ON 02/07/23 @ 1741 BY DRT    Enterobacter cloacae complex NOT DETECTED NOT DETECTED Final   Escherichia coli DETECTED (A) NOT DETECTED Final    Comment: CRITICAL  RESULT CALLED TO, READ BACK BY AND VERIFIED WITH: PHARMD LEANN POINDEXTER ON 02/07/23 @ 1741 BY DRT    Klebsiella aerogenes NOT DETECTED NOT DETECTED Final   Klebsiella oxytoca NOT DETECTED NOT DETECTED Final   Klebsiella pneumoniae NOT DETECTED NOT DETECTED Final   Proteus species NOT DETECTED NOT DETECTED Final   Salmonella species NOT DETECTED NOT DETECTED Final   Serratia marcescens NOT DETECTED NOT DETECTED Final   Haemophilus influenzae NOT DETECTED NOT DETECTED Final   Neisseria meningitidis NOT DETECTED NOT DETECTED Final   Pseudomonas aeruginosa NOT DETECTED NOT DETECTED Final   Stenotrophomonas maltophilia NOT DETECTED NOT DETECTED Final   Candida albicans NOT DETECTED NOT DETECTED Final   Candida auris NOT DETECTED NOT DETECTED Final   Candida glabrata NOT DETECTED NOT DETECTED Final   Candida krusei NOT DETECTED NOT DETECTED Final   Candida parapsilosis NOT DETECTED NOT DETECTED Final   Candida tropicalis NOT DETECTED NOT DETECTED Final   Cryptococcus neoformans/gattii NOT DETECTED NOT DETECTED Final   CTX-M ESBL NOT DETECTED NOT DETECTED Final   Carbapenem resistance IMP NOT DETECTED NOT DETECTED Final   Carbapenem resistance KPC NOT DETECTED NOT DETECTED Final   Carbapenem resistance NDM NOT DETECTED NOT DETECTED Final   Carbapenem resist OXA 48 LIKE NOT DETECTED NOT DETECTED Final   Carbapenem resistance VIM NOT DETECTED NOT DETECTED Final    Comment: Performed at [facility], [place] [address] [place], [place] [address]         Radiology Studies: CT Angio Chest PE W/Cm &/Or Wo Cm  Result Date: 02/07/2023 CLINICAL DATA:  Pulmonary embolism (PE) suspected, high prob EXAM: CT ANGIOGRAPHY CHEST WITH CONTRAST TECHNIQUE: Multidetector CT imaging of the chest was performed using the standard protocol during bolus administration of intravenous contrast. Multiplanar CT image reconstructions and MIPs were obtained to evaluate the vascular anatomy. RADIATION DOSE REDUCTION:  This exam was performed according to the departmental dose-optimization program which includes automated exposure control, adjustment of the mA and/or kV according to patient size and/or use of iterative reconstruction technique. CONTRAST:  124mL OMNIPAQUE IOHEXOL 350 MG/ML SOLN COMPARISON:  12/02/2020 FINDINGS: Cardiovascular: No filling defects in the pulmonary arteries to suggest  pulmonary emboli. Heart is normal size. Coronary artery and aortic calcifications. No aneurysm. Mediastinum/Nodes: No mediastinal, hilar, or axillary adenopathy. Trachea and esophagus are unremarkable. Thyroid unremarkable. Lungs/Pleura: No confluent opacity or effusion. Upper Abdomen: No acute findings Musculoskeletal: Chest wall soft tissues are unremarkable. No acute bony abnormality. Review of the MIP images confirms the above findings. IMPRESSION: No evidence of pulmonary embolus. No acute cardiopulmonary disease. Scattered coronary artery calcifications. Aortic Atherosclerosis (ICD10-I70.0). Electronically Signed   By: Rolm Baptise M.D.   On: 02/07/2023 01:43   DG Chest 2 View  Result Date: 02/06/2023 CLINICAL DATA:  Shortness of breath. EXAM: CHEST - 2 VIEW COMPARISON:  Chest radiograph dated 06/05/2022. FINDINGS: No focal consolidation, pleural effusion, or pneumothorax. The cardiac silhouette is within normal limits. No acute osseous pathology. IMPRESSION: No active cardiopulmonary disease. Electronically Signed   By: Anner Crete M.D.   On: 02/06/2023 22:45        Scheduled Meds:  atorvastatin  10 mg Oral Daily   docusate sodium  100 mg Oral BID   enoxaparin (LOVENOX) injection  40 mg Subcutaneous Daily   gabapentin  100 mg Oral TID   insulin aspart  0-15 Units Subcutaneous TID WC   insulin aspart  0-5 Units Subcutaneous QHS   pantoprazole  40 mg Oral Daily   sertraline  300 mg Oral Daily   traZODone  150 mg Oral QHS   Continuous Infusions:  sodium chloride 100 mL/hr at 02/07/23 1811   cefTRIAXone  (ROCEPHIN)  IV 2 g (02/07/23 2352)     LOS: 1 day    Time spent: 45 minutes spent on chart review, discussion with nursing staff, consultants, updating family and interview/physical exam; more than 50% of that time was spent in counseling and/or coordination of care.    Geradine Girt, DO [facility] Available via Epic secure chat 7am-7pm After these hours, please refer to coverage provider listed on [email or web address] 02/08/2023, 8:21 AM

## 2023-02-08 NOTE — Plan of Care (Signed)

## 2023-02-09 DIAGNOSIS — N390 Urinary tract infection, site not specified: Secondary | ICD-10-CM | POA: Diagnosis not present

## 2023-02-09 DIAGNOSIS — A419 Sepsis, unspecified organism: Secondary | ICD-10-CM | POA: Diagnosis not present

## 2023-02-09 LAB — CULTURE, BLOOD (ROUTINE X 2)

## 2023-02-09 LAB — BASIC METABOLIC PANEL
Anion gap: 6 (ref 5–15)
BUN: 6 mg/dL — ABNORMAL LOW (ref 8–23)
CO2: 26 mmol/L (ref 22–32)
Calcium: 8.3 mg/dL — ABNORMAL LOW (ref 8.9–10.3)
Chloride: 108 mmol/L (ref 98–111)
Creatinine, Ser: 0.48 mg/dL (ref 0.44–1.00)
GFR, Estimated: >60 mL/min (ref >60)
Glucose, Bld: 103 mg/dL — ABNORMAL HIGH (ref 70–99)
Potassium: 3.8 mmol/L (ref 3.5–5.1)
Sodium: 140 mmol/L (ref 135–145)

## 2023-02-09 LAB — CBC
HCT: 39.8 % (ref 36.0–46.0)
Hemoglobin: 13.2 g/dL (ref 12.0–15.0)
MCH: 31.1 pg (ref 26.0–34.0)
MCHC: 33.2 g/dL (ref 30.0–36.0)
MCV: 93.6 fL (ref 80.0–100.0)
Platelets: 84 10*3/uL — ABNORMAL LOW (ref 150–400)
RBC: 4.25 MIL/uL (ref 3.87–5.11)
RDW: 13.4 % (ref 11.5–15.5)
WBC: 4.2 10*3/uL (ref 4.0–10.5)
nRBC: 0 % (ref 0.0–0.2)

## 2023-02-09 LAB — GLUCOSE, CAPILLARY
Glucose-Capillary: 96 mg/dL (ref 70–99)
Glucose-Capillary: 96 mg/dL (ref 70–99)
Glucose-Capillary: 129 mg/dL — ABNORMAL HIGH (ref 70–99)
Glucose-Capillary: 107 mg/dL — ABNORMAL HIGH (ref 70–99)

## 2023-02-09 MED ORDER — AMOXICILLIN-POT CLAVULANATE 875-125 MG PO TABS
1.0000 | ORAL_TABLET | Freq: Two times a day (BID) | ORAL | Status: DC
  Administered 2023-02-09 – 2023-02-10 (×3): 1 via ORAL
  Filled 2023-02-09 (×3): qty 1

## 2023-02-09 NOTE — Progress Notes (Signed)
PROGRESS NOTE    [name] [name]  [id number] DOB: 1956-06-03 DOA: 02/06/2023 PCP: Iona Beard, MD    Brief Narrative:  [name] [name] is a 67 y.o. female with medical history significant for obesity, anxiety, obstructive sleep apnea, insulin-dependent type 2 diabetes and recent COVID infection presented to the emergency department with complaints of shortness of breath.  She was found to be saturating about 88% on room air, she was also quite hypotensive.  She was diagnosed with COVID about 10 days ago, since that time she has been short of breath.  She tells me that she has generally been tired    Assessment and Plan: Sepsis secondary to UTI and bacteremia due to ecoli -- hypotension, leukocytosis-  Source of infection is urinary tract infection. -Continue empiric IV Rocephin -Follow blood cultures, urine culture- ecoli    GERD (gastroesophageal reflux disease) -continue oral PPI   Obesity-BMI 35, complicating all aspects of care Estimated body mass index is 34.97 kg/m as calculated from the following:   Height as of this encounter: 5\' 8"  (1.727 m).   Weight as of this encounter: 104.3 kg.     Depression -resume home meds    OSA (obstructive sleep apnea)    Leukocytosis and hypotension -due to her urinary tract infection and sepsis, treating as above    DM2 (diabetes mellitus, type 2) -SSI   Thrombocytopenia-noted, unclear chronicity, will trend with labs and will need to be followed up outpatient. Could be secondary to current sepsis.   DVT prophylaxis: Place and maintain sequential compression device Start: 02/09/23 0852 SCDs Start: 02/07/23 0923    Code Status: Full Code   Disposition Plan:  Level of care: Med-Surg Status is: Inpatient Remains inpatient appropriate because: needs IV abx and cultures    Consultants:  none   Subjective: Eating well  Objective: Vitals:   02/08/23 0624 02/08/23 1202 02/08/23 2058 02/09/23 0435  BP:  107/79 121/66 (!) 117/53 126/67  Pulse: 85 80 75 71  Resp:   (!) 22 20  Temp: 100.2 F (37.9 C) 99.7 F (37.6 C) 98.9 F (37.2 C) 98.1 F (36.7 C)  TempSrc: Oral Oral Oral Oral  SpO2: (!) 88% 92% 92% 91%  Weight:      Height:        Intake/Output Summary (Last 24 hours) at 02/09/2023 0943 Last data filed at 02/09/2023 [id number] Gross per 24 hour  Intake 4324.72 ml  Output 3000 ml  Net 1324.72 ml   Filed Weights   02/06/23 2211  Weight: 104.3 kg    Examination:  General: Appearance:    Obese female in no acute distress     Lungs:      respirations unlabored  Heart:    Normal heart rate.   MS:   All extremities are intact.   Neurologic:   Awake, alert       Data Reviewed: I have personally reviewed following labs and imaging studies  CBC: Recent Labs  Lab 02/06/23 2354 02/07/23 0947 02/08/23 0429 02/09/23 0353  WBC 26.2* 19.3* 8.4 4.2  HGB 15.2* 15.2* 13.3 13.2  HCT 45.3 46.8* 40.2 39.8  MCV 92.8 96.1 95.3 93.6  PLT 125* 124* PLATELET CLUMPS NOTED ON SMEAR, UNABLE TO ESTIMATE 84*   Basic Metabolic Panel: Recent Labs  Lab 02/06/23 2354 02/07/23 0947 02/08/23 0429 02/09/23 0353  NA 137  --  137 140  K 3.5  --  3.6 3.8  CL 105  --  107 108  CO2 24  --  25 26  GLUCOSE 109*  --  96 103*  BUN 8  --  9 6*  CREATININE 0.71 0.57 0.65 0.48  CALCIUM 8.5*  --  7.9* 8.3*   GFR: Estimated Creatinine Clearance: 86.3 mL/min (by C-G formula based on SCr of 0.48 mg/dL). Liver Function Tests: No results for input(s): "AST", "ALT", "ALKPHOS", "BILITOT", "PROT", "ALBUMIN" in the last 168 hours. No results for input(s): "LIPASE", "AMYLASE" in the last 168 hours. No results for input(s): "AMMONIA" in the last 168 hours. Coagulation Profile: No results for input(s): "INR", "PROTIME" in the last 168 hours. Cardiac Enzymes: No results for input(s): "CKTOTAL", "CKMB", "CKMBINDEX", "TROPONINI" in the last 168 hours. BNP (last 3 results) No results for input(s): "PROBNP" in  the last 8760 hours. HbA1C: Recent Labs    02/07/23 0947  HGBA1C 5.6   CBG: Recent Labs  Lab 02/08/23 0754 02/08/23 1203 02/08/23 1715 02/08/23 2052 02/09/23 0737  GLUCAP 94 97 94 110* 96   Lipid Profile: No results for input(s): "CHOL", "HDL", "LDLCALC", "TRIG", "CHOLHDL", "LDLDIRECT" in the last 72 hours. Thyroid Function Tests: No results for input(s): "TSH", "T4TOTAL", "FREET4", "T3FREE", "THYROIDAB" in the last 72 hours. Anemia Panel: No results for input(s): "VITAMINB12", "FOLATE", "FERRITIN", "TIBC", "IRON", "RETICCTPCT" in the last 72 hours. Sepsis Labs: Recent Labs  Lab 02/07/23 0100  LATICACIDVEN 1.3    Recent Results (from the past 240 hour(s))  Urine Culture (for pregnant, neutropenic or urologic patients or patients with an indwelling urinary catheter)     Status: Abnormal (Preliminary result)   Collection Time: 02/07/23  4:34 AM   Specimen: Urine, Clean Catch  Result Value Ref Range Status   Specimen Description   Final    URINE, CLEAN CATCH Performed at [facility], [place] [address], [place], [place] [address]    Special Requests   Final    NONE Performed at [facility], [place] [address], [place], [place] [address]    Culture (A)  Final    >=100,000 COLONIES/mL ESCHERICHIA COLI SUSCEPTIBILITIES TO FOLLOW Performed at [facility], [place] [address] [place], [place] [address]    Report Status PENDING  Incomplete  Blood culture (routine x 2)     Status: Abnormal   Collection Time: 02/07/23  5:59 AM   Specimen: BLOOD LEFT HAND  Result Value Ref Range Status   Specimen Description   Final    BLOOD LEFT HAND Performed at [facility], [place] [address] [place], [place] [address]    Special Requests   Final    BOTTLES DRAWN AEROBIC AND ANAEROBIC Blood Culture results may not be optimal due to an excessive volume of blood received in culture bottles Performed at [place] [facility]  [address] [place], [place] [address]    Culture  Setup Time   Final    GRAM NEGATIVE RODS IN BOTH AEROBIC AND ANAEROBIC BOTTLES CRITICAL VALUE NOTED.  VALUE IS CONSISTENT WITH PREVIOUSLY REPORTED AND CALLED VALUE.    Culture (A)  Final    ESCHERICHIA COLI SUSCEPTIBILITIES PERFORMED ON PREVIOUS CULTURE WITHIN THE LAST 5 DAYS. Performed at [facility], [place] [address], [place], [place] [address]    Report Status 02/09/2023 FINAL  Final  Blood culture (routine x 2)     Status: Abnormal   Collection Time: 02/07/23  5:59 AM   Specimen: BLOOD  Result Value Ref Range Status   Specimen Description   Final    BLOOD SITE  NOT SPECIFIED Performed at [place] [address], [place], [place] [address]    Special Requests   Final    BOTTLES DRAWN AEROBIC AND ANAEROBIC Blood Culture results may not be optimal due to an excessive volume of blood received in culture bottles Performed at [facility] [address] [place], [place] [address]    Culture  Setup Time   Final    GRAM NEGATIVE RODS IN BOTH AEROBIC AND ANAEROBIC BOTTLES CRITICAL RESULT CALLED TO, READ BACK BY AND VERIFIED WITH: PHARMD LEANN POINDEXTER ON 02/07/23 @ 1741 BY DRT Performed at [facility], [place] [address] [place], [place] [address]    Culture ESCHERICHIA COLI (A)  Final   Report Status 02/09/2023 FINAL  Final   Organism ID, Bacteria ESCHERICHIA COLI  Final      Susceptibility   Escherichia coli - MIC*    AMPICILLIN 16 INTERMEDIATE Intermediate     CEFEPIME <=0.12 SENSITIVE Sensitive     CEFTAZIDIME <=1 SENSITIVE Sensitive     CEFTRIAXONE <=0.25 SENSITIVE Sensitive     CIPROFLOXACIN <=0.25 SENSITIVE Sensitive     GENTAMICIN <=1 SENSITIVE Sensitive     IMIPENEM <=0.25 SENSITIVE Sensitive     TRIMETH/SULFA <=20 SENSITIVE Sensitive     AMPICILLIN/SULBACTAM 4 SENSITIVE Sensitive     PIP/TAZO <=4 SENSITIVE Sensitive     * ESCHERICHIA COLI  Blood Culture ID Panel  (Reflexed)     Status: Abnormal   Collection Time: 02/07/23  5:59 AM  Result Value Ref Range Status   Enterococcus faecalis NOT DETECTED NOT DETECTED Final   Enterococcus Faecium NOT DETECTED NOT DETECTED Final   Listeria monocytogenes NOT DETECTED NOT DETECTED Final   Staphylococcus species NOT DETECTED NOT DETECTED Final   Staphylococcus aureus (BCID) NOT DETECTED NOT DETECTED Final   Staphylococcus epidermidis NOT DETECTED NOT DETECTED Final   Staphylococcus lugdunensis NOT DETECTED NOT DETECTED Final   Streptococcus species NOT DETECTED NOT DETECTED Final   Streptococcus agalactiae NOT DETECTED NOT DETECTED Final   Streptococcus pneumoniae NOT DETECTED NOT DETECTED Final   Streptococcus pyogenes NOT DETECTED NOT DETECTED Final   A.calcoaceticus-baumannii NOT DETECTED NOT DETECTED Final   Bacteroides fragilis NOT DETECTED NOT DETECTED Final   Enterobacterales DETECTED (A) NOT DETECTED Final    Comment: Enterobacterales represent a large order of gram negative bacteria, not a single organism. CRITICAL RESULT CALLED TO, READ BACK BY AND VERIFIED WITH: PHARMD LEANN POINDEXTER ON 02/07/23 @ 1741 BY DRT    Enterobacter cloacae complex NOT DETECTED NOT DETECTED Final   Escherichia coli DETECTED (A) NOT DETECTED Final    Comment: CRITICAL RESULT CALLED TO, READ BACK BY AND VERIFIED WITH: PHARMD LEANN POINDEXTER ON 02/07/23 @ 1741 BY DRT    Klebsiella aerogenes NOT DETECTED NOT DETECTED Final   Klebsiella oxytoca NOT DETECTED NOT DETECTED Final   Klebsiella pneumoniae NOT DETECTED NOT DETECTED Final   Proteus species NOT DETECTED NOT DETECTED Final   Salmonella species NOT DETECTED NOT DETECTED Final   Serratia marcescens NOT DETECTED NOT DETECTED Final   Haemophilus influenzae NOT DETECTED NOT DETECTED Final   Neisseria meningitidis NOT DETECTED NOT DETECTED Final   Pseudomonas aeruginosa NOT DETECTED NOT DETECTED Final   Stenotrophomonas maltophilia NOT DETECTED NOT DETECTED Final    Candida albicans NOT DETECTED NOT DETECTED Final   Candida auris NOT DETECTED NOT DETECTED Final   Candida glabrata NOT DETECTED NOT DETECTED Final   Candida krusei NOT DETECTED NOT DETECTED Final  Candida parapsilosis NOT DETECTED NOT DETECTED Final   Candida tropicalis NOT DETECTED NOT DETECTED Final   Cryptococcus neoformans/gattii NOT DETECTED NOT DETECTED Final   CTX-M ESBL NOT DETECTED NOT DETECTED Final   Carbapenem resistance IMP NOT DETECTED NOT DETECTED Final   Carbapenem resistance KPC NOT DETECTED NOT DETECTED Final   Carbapenem resistance NDM NOT DETECTED NOT DETECTED Final   Carbapenem resist OXA 48 LIKE NOT DETECTED NOT DETECTED Final   Carbapenem resistance VIM NOT DETECTED NOT DETECTED Final    Comment: Performed at [facility], [place] [address] [place],  [address]         Radiology Studies: No results found.      Scheduled Meds:  atorvastatin  10 mg Oral Daily   docusate sodium  100 mg Oral BID   gabapentin  100 mg Oral TID   insulin aspart  0-15 Units Subcutaneous TID WC   insulin aspart  0-5 Units Subcutaneous QHS   pantoprazole  40 mg Oral Daily   sertraline  300 mg Oral Daily   traZODone  150 mg Oral QHS   Continuous Infusions:  cefTRIAXone (ROCEPHIN)  IV 2 g (02/08/23 2347)     LOS: 2 days    Time spent: 45 minutes spent on chart review, discussion with nursing staff, consultants, updating family and interview/physical exam; more than 50% of that time was spent in counseling and/or coordination of care.    Geradine Girt, DO [facility] Available via Epic secure chat 7am-7pm After these hours, please refer to coverage provider listed on [email or web address] 02/09/2023, 9:43 AM

## 2023-02-09 NOTE — Progress Notes (Signed)
.   Transition of Care [facility]) Screening Note   Patient Details  Name: [name] [name] Date of Birth: Jun 13, 1956   Transition of Care [facility]) CM/SW Contact:    Illene Regulus, LCSW Phone Number: 02/09/2023, 12:35 PM    Transition of Care Department [facility]) has reviewed patient and no TOC needs have been identified at this time. We will continue to monitor patient advancement through interdisciplinary progression rounds. If new patient transition needs arise, please place a TOC consult.

## 2023-02-10 ENCOUNTER — Other Ambulatory Visit: Payer: Self-pay | Admitting: Nurse Practitioner

## 2023-02-10 DIAGNOSIS — A419 Sepsis, unspecified organism: Secondary | ICD-10-CM | POA: Diagnosis not present

## 2023-02-10 DIAGNOSIS — N390 Urinary tract infection, site not specified: Secondary | ICD-10-CM | POA: Diagnosis not present

## 2023-02-10 LAB — URINE CULTURE: Culture: >=100000 — AB

## 2023-02-10 LAB — GLUCOSE, CAPILLARY
Glucose-Capillary: 122 mg/dL — ABNORMAL HIGH (ref 70–99)
Glucose-Capillary: 97 mg/dL (ref 70–99)

## 2023-02-10 MED ORDER — NYSTATIN 100000 UNIT/GM EX POWD
Freq: Two times a day (BID) | CUTANEOUS | Status: DC
  Administered 2023-02-10: 1 via TOPICAL
  Filled 2023-02-10: qty 15

## 2023-02-10 MED ORDER — NYSTATIN 100000 UNIT/GM EX POWD: Freq: Two times a day (BID) | CUTANEOUS | 0 refills | Status: DC

## 2023-02-10 MED ORDER — AMOXICILLIN-POT CLAVULANATE 875-125 MG PO TABS: 1.0000 | ORAL_TABLET | Freq: Two times a day (BID) | ORAL | 0 refills | Status: DC

## 2023-02-10 MED ORDER — NYSTATIN 100000 UNIT/GM EX CREA: 1.0000 | TOPICAL_CREAM | Freq: Two times a day (BID) | CUTANEOUS | 0 refills | Status: DC

## 2023-02-10 NOTE — Discharge Summary (Addendum)
Physician Discharge Summary  [name] [name] WUJ:[id number] DOB: 10/08/1956 DOA: 02/06/2023  PCP: [name] [name]  Admit date: 02/06/2023 Discharge date: 02/10/2023  Admitted From: home Discharge disposition: home   Recommendations for Outpatient Follow-Up:   Cbc 1 week   Discharge Diagnosis:   Principal Problem:   Sepsis secondary to UTI Active Problems:   GERD (gastroesophageal reflux disease)   Morbid obesity   Depression   OSA (obstructive sleep apnea)   OCD (obsessive compulsive disorder)   Leukocytosis   Hypotension   DM2 (diabetes mellitus, type 2)   UTI (urinary tract infection)    Discharge Condition: Improved.  Diet recommendation: Low sodium, heart healthy.  Carbohydrate-modified  Wound care: None.  Code status: Full.   History of Present Illness:   [name] [name] is a 67 y.o. female with medical history significant for obesity, anxiety, obstructive sleep apnea, insulin-dependent type 2 diabetes and recent COVID infection presented to the emergency department with complaints of shortness of breath.  She was found to be saturating about 88% on room air, she was also quite hypotensive.  She was diagnosed with COVID about 10 days ago, since that time she has been short of breath.  She tells me that she has generally been tired    Hospital Course by Problem:   Sepsis secondary to UTI and bacteremia due to ecoli -- hypotension, leukocytosis-  Source of infection is urinary tract infection. -IV abx change to PO abx to complete course      GERD (gastroesophageal reflux disease) -continue oral PPI    Obesity-BMI 35, complicating all aspects of care Estimated body mass index is 34.97 kg/m as calculated from the following:   Height as of this encounter: 5\' 8"  (1.727 m).   Weight as of this encounter: 104.3 kg.      Depression -resume home meds     OSA (obstructive sleep apnea)     Leukocytosis and hypotension -due to her  urinary tract infection and sepsis, treating as above     DM2 (diabetes mellitus, type 2) -resume home regimen    Thrombocytopenia-noted, unclear chronicity, will trend with labs and will need to be followed up outpatient. Could be secondary to current sepsis. -outpatient CBC when sepsis picture resolved      Medical Consultants:      Discharge Exam:   Vitals:   02/10/23 0443 02/10/23 1223  BP: (!) 132/56 131/80  Pulse: 62 66  Resp: 16 16  Temp: 98.2 F (36.8 C) 97.6 F (36.4 C)  SpO2: 92% 95%   Vitals:   02/09/23 1415 02/09/23 2030 02/10/23 0443 02/10/23 1223  BP: (!) 111/50 120/88 (!) 132/56 131/80  Pulse: 73 67 62 66  Resp: 18 20 16 16   Temp: 98.3 F (36.8 C) 98.6 F (37 C) 98.2 F (36.8 C) 97.6 F (36.4 C)  TempSrc: Oral Oral Oral Oral  SpO2: (!) 89% 92% 92% 95%  Weight:      Height:        General exam: Appears calm and comfortable.    The results of significant diagnostics from this hospitalization (including imaging, microbiology, ancillary and laboratory) are listed below for reference.     Procedures and Diagnostic Studies:   CT Angio Chest PE W/Cm &/Or Wo Cm  Result Date: 02/07/2023 CLINICAL DATA:  Pulmonary embolism (PE) suspected, high prob EXAM: CT ANGIOGRAPHY CHEST WITH CONTRAST TECHNIQUE: Multidetector CT imaging of the chest was performed using the standard protocol during bolus administration  of intravenous contrast. Multiplanar CT image reconstructions and MIPs were obtained to evaluate the vascular anatomy. RADIATION DOSE REDUCTION: This exam was performed according to the departmental dose-optimization program which includes automated exposure control, adjustment of the mA and/or kV according to patient size and/or use of iterative reconstruction technique. CONTRAST:  100mL OMNIPAQUE IOHEXOL 350 MG/ML SOLN COMPARISON:  12/02/2020 FINDINGS: Cardiovascular: No filling defects in the pulmonary arteries to suggest pulmonary emboli. Heart is normal  size. Coronary artery and aortic calcifications. No aneurysm. Mediastinum/Nodes: No mediastinal, hilar, or axillary adenopathy. Trachea and esophagus are unremarkable. Thyroid unremarkable. Lungs/Pleura: No confluent opacity or effusion. Upper Abdomen: No acute findings Musculoskeletal: Chest wall soft tissues are unremarkable. No acute bony abnormality. Review of the MIP images confirms the above findings. IMPRESSION: No evidence of pulmonary embolus. No acute cardiopulmonary disease. Scattered coronary artery calcifications. Aortic Atherosclerosis (ICD10-I70.0). Electronically Signed   By: Charlett NoseKevin  Dover M.D.   On: 02/07/2023 01:43   DG Chest 2 View  Result Date: 02/06/2023 CLINICAL DATA:  Shortness of breath. EXAM: CHEST - 2 VIEW COMPARISON:  Chest radiograph dated 06/05/2022. FINDINGS: No focal consolidation, pleural effusion, or pneumothorax. The cardiac silhouette is within normal limits. No acute osseous pathology. IMPRESSION: No active cardiopulmonary disease. Electronically Signed   By: Elgie CollardArash  Radparvar M.D.   On: 02/06/2023 22:45     Labs:   Basic Metabolic Panel: Recent Labs  Lab 02/06/23 2354 02/07/23 0947 02/08/23 0429 02/09/23 0353  NA 137  --  137 140  K 3.5  --  3.6 3.8  CL 105  --  107 108  CO2 24  --  25 26  GLUCOSE 109*  --  96 103*  BUN 8  --  9 6*  CREATININE 0.71 0.57 0.65 0.48  CALCIUM 8.5*  --  7.9* 8.3*   GFR Estimated Creatinine Clearance: 86.3 mL/min (by C-G formula based on SCr of 0.48 mg/dL). Liver Function Tests: No results for input(s): "AST", "ALT", "ALKPHOS", "BILITOT", "PROT", "ALBUMIN" in the last 168 hours. No results for input(s): "LIPASE", "AMYLASE" in the last 168 hours. No results for input(s): "AMMONIA" in the last 168 hours. Coagulation profile No results for input(s): "INR", "PROTIME" in the last 168 hours.  CBC: Recent Labs  Lab 02/06/23 2354 02/07/23 0947 02/08/23 0429 02/09/23 0353  WBC 26.2* 19.3* 8.4 4.2  HGB 15.2* 15.2* 13.3  13.2  HCT 45.3 46.8* 40.2 39.8  MCV 92.8 96.1 95.3 93.6  PLT 125* 124* PLATELET CLUMPS NOTED ON SMEAR, UNABLE TO ESTIMATE 84*   Cardiac Enzymes: No results for input(s): "CKTOTAL", "CKMB", "CKMBINDEX", "TROPONINI" in the last 168 hours. BNP: Invalid input(s): "POCBNP" CBG: Recent Labs  Lab 02/09/23 1206 02/09/23 1759 02/09/23 2028 02/10/23 0738 02/10/23 1112  GLUCAP 96 129* 107* 122* 97   D-Dimer No results for input(s): "DDIMER" in the last 72 hours. Hgb A1c No results for input(s): "HGBA1C" in the last 72 hours.  Lipid Profile No results for input(s): "CHOL", "HDL", "LDLCALC", "TRIG", "CHOLHDL", "LDLDIRECT" in the last 72 hours. Thyroid function studies No results for input(s): "TSH", "T4TOTAL", "T3FREE", "THYROIDAB" in the last 72 hours.  Invalid input(s): "FREET3" Anemia work up No results for input(s): "VITAMINB12", "FOLATE", "FERRITIN", "TIBC", "IRON", "RETICCTPCT" in the last 72 hours. Microbiology Recent Results (from the past 240 hour(s))  Urine Culture (for pregnant, neutropenic or urologic patients or patients with an indwelling urinary catheter)     Status: Abnormal   Collection Time: 02/07/23  4:34 AM   Specimen: Urine, Clean Catch  Result Value Ref Range Status   Specimen Description   Final    URINE, CLEAN CATCH Performed at [facility], [address], [place], [place] [address]    Special Requests   Final    NONE Performed at [facility], [address] [place], [place] [address]    Culture >=100,000 COLONIES/mL ESCHERICHIA COLI (A)  Final   Report Status 02/10/2023 FINAL  Final   Organism ID, Bacteria ESCHERICHIA COLI (A)  Final      Susceptibility   Escherichia coli - MIC*    AMPICILLIN 16 INTERMEDIATE Intermediate     CEFAZOLIN <=4 SENSITIVE Sensitive     CEFEPIME <=0.12 SENSITIVE Sensitive     CEFTRIAXONE <=0.25 SENSITIVE Sensitive     CIPROFLOXACIN <=0.25 SENSITIVE Sensitive     GENTAMICIN <=1  SENSITIVE Sensitive     IMIPENEM <=0.25 SENSITIVE Sensitive     NITROFURANTOIN 32 SENSITIVE Sensitive     TRIMETH/SULFA <=20 SENSITIVE Sensitive     AMPICILLIN/SULBACTAM 4 SENSITIVE Sensitive     PIP/TAZO <=4 SENSITIVE Sensitive     * >=100,000 COLONIES/mL ESCHERICHIA COLI  Blood culture (routine x 2)     Status: Abnormal   Collection Time: 02/07/23  5:59 AM   Specimen: BLOOD LEFT HAND  Result Value Ref Range Status   Specimen Description   Final    BLOOD LEFT HAND Performed at [facility], [address] [place], [place] [address]    Special Requests   Final    BOTTLES DRAWN AEROBIC AND ANAEROBIC Blood Culture results may not be optimal due to an excessive volume of blood received in culture bottles Performed at [facility], [address] [place], [place] [address]    Culture  Setup Time   Final    GRAM NEGATIVE RODS IN BOTH AEROBIC AND ANAEROBIC BOTTLES CRITICAL VALUE NOTED.  VALUE IS CONSISTENT WITH PREVIOUSLY REPORTED AND CALLED VALUE.    Culture (A)  Final    ESCHERICHIA COLI SUSCEPTIBILITIES PERFORMED ON PREVIOUS CULTURE WITHIN THE LAST 5 DAYS. Performed at [facility], [address] [place], [place] [address]    Report Status 02/09/2023 FINAL  Final  Blood culture (routine x 2)     Status: Abnormal   Collection Time: 02/07/23  5:59 AM   Specimen: BLOOD  Result Value Ref Range Status   Specimen Description   Final    BLOOD SITE NOT SPECIFIED Performed at [facility], [address], [place], [place] [address]    Special Requests   Final    BOTTLES DRAWN AEROBIC AND ANAEROBIC Blood Culture results may not be optimal due to an excessive volume of blood received in culture bottles Performed at [facility], [address], [place], [place] [address]    Culture  Setup Time   Final    GRAM NEGATIVE RODS IN BOTH AEROBIC AND ANAEROBIC BOTTLES CRITICAL RESULT CALLED TO, READ BACK BY AND VERIFIED  WITH: PHARMD LEANN POINDEXTER ON 02/07/23 @ 1741 BY DRT Performed at [facility], [address] [place], [place] [address]    Culture ESCHERICHIA COLI (A)  Final   Report Status 02/09/2023 FINAL  Final   Organism ID, Bacteria ESCHERICHIA COLI  Final      Susceptibility   Escherichia coli - MIC*    AMPICILLIN 16 INTERMEDIATE Intermediate     CEFEPIME <=0.12 SENSITIVE Sensitive     CEFTAZIDIME <=1 SENSITIVE Sensitive  CEFTRIAXONE <=0.25 SENSITIVE Sensitive     CIPROFLOXACIN <=0.25 SENSITIVE Sensitive     GENTAMICIN <=1 SENSITIVE Sensitive     IMIPENEM <=0.25 SENSITIVE Sensitive     TRIMETH/SULFA <=20 SENSITIVE Sensitive     AMPICILLIN/SULBACTAM 4 SENSITIVE Sensitive     PIP/TAZO <=4 SENSITIVE Sensitive     * ESCHERICHIA COLI  Blood Culture ID Panel (Reflexed)     Status: Abnormal   Collection Time: 02/07/23  5:59 AM  Result Value Ref Range Status   Enterococcus faecalis NOT DETECTED NOT DETECTED Final   Enterococcus Faecium NOT DETECTED NOT DETECTED Final   Listeria monocytogenes NOT DETECTED NOT DETECTED Final   Staphylococcus species NOT DETECTED NOT DETECTED Final   Staphylococcus aureus (BCID) NOT DETECTED NOT DETECTED Final   Staphylococcus epidermidis NOT DETECTED NOT DETECTED Final   Staphylococcus lugdunensis NOT DETECTED NOT DETECTED Final   Streptococcus species NOT DETECTED NOT DETECTED Final   Streptococcus agalactiae NOT DETECTED NOT DETECTED Final   Streptococcus pneumoniae NOT DETECTED NOT DETECTED Final   Streptococcus pyogenes NOT DETECTED NOT DETECTED Final   A.calcoaceticus-baumannii NOT DETECTED NOT DETECTED Final   Bacteroides fragilis NOT DETECTED NOT DETECTED Final   Enterobacterales DETECTED (A) NOT DETECTED Final    Comment: Enterobacterales represent a large order of gram negative bacteria, not a single organism. CRITICAL RESULT CALLED TO, READ BACK BY AND VERIFIED WITH: PHARMD LEANN POINDEXTER ON 02/07/23 @ 1741 BY DRT    Enterobacter cloacae  complex NOT DETECTED NOT DETECTED Final   Escherichia coli DETECTED (A) NOT DETECTED Final    Comment: CRITICAL RESULT CALLED TO, READ BACK BY AND VERIFIED WITH: PHARMD LEANN POINDEXTER ON 02/07/23 @ 1741 BY DRT    Klebsiella aerogenes NOT DETECTED NOT DETECTED Final   Klebsiella oxytoca NOT DETECTED NOT DETECTED Final   Klebsiella pneumoniae NOT DETECTED NOT DETECTED Final   Proteus species NOT DETECTED NOT DETECTED Final   Salmonella species NOT DETECTED NOT DETECTED Final   Serratia marcescens NOT DETECTED NOT DETECTED Final   Haemophilus influenzae NOT DETECTED NOT DETECTED Final   Neisseria meningitidis NOT DETECTED NOT DETECTED Final   Pseudomonas aeruginosa NOT DETECTED NOT DETECTED Final   Stenotrophomonas maltophilia NOT DETECTED NOT DETECTED Final   Candida albicans NOT DETECTED NOT DETECTED Final   Candida auris NOT DETECTED NOT DETECTED Final   Candida glabrata NOT DETECTED NOT DETECTED Final   Candida krusei NOT DETECTED NOT DETECTED Final   Candida parapsilosis NOT DETECTED NOT DETECTED Final   Candida tropicalis NOT DETECTED NOT DETECTED Final   Cryptococcus neoformans/gattii NOT DETECTED NOT DETECTED Final   CTX-M ESBL NOT DETECTED NOT DETECTED Final   Carbapenem resistance IMP NOT DETECTED NOT DETECTED Final   Carbapenem resistance KPC NOT DETECTED NOT DETECTED Final   Carbapenem resistance NDM NOT DETECTED NOT DETECTED Final   Carbapenem resist OXA 48 LIKE NOT DETECTED NOT DETECTED Final   Carbapenem resistance VIM NOT DETECTED NOT DETECTED Final    Comment: Performed at [facility], [address], [place], [place] [address]     Discharge Instructions:   Discharge Instructions     Diet Carb Modified   Complete by: As directed    Increase activity slowly   Complete by: As directed       Allergies as of 02/10/2023       Reactions   Ibuprofen Anaphylaxis, Hives, Swelling   Throat swells   Clonazepam Other (See Comments)   Dizziness  Medication List     STOP taking these medications    Cholecalciferol 20 MCG (800 UNIT) Tabs   fluticasone furoate-vilanterol 100-25 MCG/INH Aepb Commonly known as: BREO ELLIPTA       TAKE these medications    acetaminophen 500 MG tablet Commonly known as: TYLENOL Take 1,000 mg by mouth every 6 (six) hours as needed (for pain OR back pain).   albuterol 108 (90 Base) MCG/ACT inhaler Commonly known as: VENTOLIN HFA Inhale 2 puffs into the lungs every 6 (six) hours as needed for wheezing or shortness of breath.   ALPRAZolam 1 MG tablet Commonly known as: XANAX TAKE 1/2 TO 1 TABLET(0.5 TO 1 MG) BY MOUTH THREE TIMES DAILY AS NEEDED FOR ANXIETY What changed: See the new instructions.   amoxicillin-clavulanate 875-125 MG tablet Commonly known as: AUGMENTIN Take 1 tablet by mouth every 12 (twelve) hours.   atorvastatin 10 MG tablet Commonly known as: LIPITOR TAKE 1 TABLET(10 MG) BY MOUTH DAILY   gabapentin 100 MG capsule Commonly known as: NEURONTIN TAKE 1 CAPSULE(100 MG) BY MOUTH THREE TIMES DAILY   nystatin cream Commonly known as: MYCOSTATIN Apply 1 Application topically 2 (two) times daily.   pantoprazole 40 MG tablet Commonly known as: PROTONIX TAKE 1 TABLET(40 MG) BY MOUTH DAILY   Pen Needles 32G X 4 MM Misc 1 Needle by Does not apply route daily at 6 (six) AM.   sertraline 100 MG tablet Commonly known as: ZOLOFT TAKE 3 TABLETS(300 MG) BY MOUTH DAILY   traZODone 150 MG tablet Commonly known as: DESYREL Take 1 tablet (150 mg total) by mouth at bedtime.          Time coordinating discharge: 45 min  Signed:  Joseph Art DO  [facility] 02/10/2023, 2:38 PM

## 2023-02-10 NOTE — Care Management Important Message (Signed)
Important Message  Patient Details IM Letter given. Name: [name] [name] MRN: [id number] Date of Birth: 31-Mar-1956   Medicare Important Message Given:  Yes     Caren Macadam 02/10/2023, 12:00 PM

## 2023-02-10 NOTE — Progress Notes (Signed)
Nystatin/Nystop powder not covered by insurance. None of the powder formulations covered by insurance but the cream and ointment formulations are covered so the cream was sent to her pharmacy.

## 2023-02-10 NOTE — Plan of Care (Signed)

## 2023-02-13 ENCOUNTER — Telehealth: Payer: Self-pay

## 2023-02-13 NOTE — Transitions of Care (Post Inpatient/ED Visit) (Signed)
   02/13/2023  Name: [name] [name] MRN: [id number] DOB: 05-19-1956  Today's TOC FU Call Status: Today's TOC FU Call Status:: Successful TOC FU Call Competed TOC FU Call Complete Date: 02/13/23  Transition Care Management Follow-up Telephone Call Discharge Facility: [facility]) Type of Discharge: Inpatient Admission Primary Inpatient Discharge Diagnosis:: acute respiratory failure with hyoxia How have you been since you were released from the hospital?: Better  Items Reviewed: Did you receive and understand the discharge instructions provided?: Yes Medications obtained and verified?: Yes (Medications Reviewed) Any new allergies since your discharge?: No Dietary orders reviewed?: Yes Do you have support at home?: Yes People in Home: spouse  Home Care and Equipment/Supplies: Were Home Health Services Ordered?: NA Any new equipment or medical supplies ordered?: NA  Functional Questionnaire: Do you need assistance with bathing/showering or dressing?: No Do you need assistance with meal preparation?: No Do you need assistance with eating?: No Do you have difficulty maintaining continence: No Do you need assistance with getting out of bed/getting out of a chair/moving?: No Do you have difficulty managing or taking your medications?: No  Follow up appointments reviewed: PCP Follow-up appointment confirmed?: No (no avail appt times, sent message to staff to schedule) Specialist Hospital Follow-up appointment confirmed?: NA Do you need transportation to your follow-up appointment?: No Do you understand care options if your condition(s) worsen?: Yes-patient verbalized understanding    SIGNATURE Karena Addison, LPN [facility] Nurse Health Advisor Direct Dial [phone]

## 2023-02-27 ENCOUNTER — Encounter: Payer: Medicare HMO | Admitting: Student

## 2023-03-02 ENCOUNTER — Other Ambulatory Visit: Payer: Self-pay | Admitting: Physician Assistant

## 2023-03-11 ENCOUNTER — Other Ambulatory Visit: Payer: Self-pay | Admitting: Physician Assistant

## 2023-04-03 ENCOUNTER — Encounter: Payer: Self-pay | Admitting: *Deleted

## 2023-04-04 ENCOUNTER — Other Ambulatory Visit: Payer: Self-pay

## 2023-04-04 DIAGNOSIS — K219 Gastro-esophageal reflux disease without esophagitis: Secondary | ICD-10-CM

## 2023-04-04 DIAGNOSIS — E785 Hyperlipidemia, unspecified: Secondary | ICD-10-CM

## 2023-04-04 DIAGNOSIS — F339 Major depressive disorder, recurrent, unspecified: Secondary | ICD-10-CM

## 2023-04-06 MED ORDER — TRAZODONE HCL 150 MG PO TABS: 150.0000 mg | ORAL_TABLET | Freq: Every day | ORAL | 0 refills | Status: DC

## 2023-04-06 MED ORDER — PANTOPRAZOLE SODIUM 40 MG PO TBEC
DELAYED_RELEASE_TABLET | ORAL | 11 refills | Status: DC
Start: 2023-04-06 — End: 2023-04-12

## 2023-04-06 MED ORDER — ATORVASTATIN CALCIUM 10 MG PO TABS: ORAL_TABLET | ORAL | 3 refills | Status: DC

## 2023-04-06 NOTE — Telephone Encounter (Signed)
Patient is scheduled to return 6/6 @2 :45.

## 2023-04-10 NOTE — Addendum Note (Signed)
Addended byCala Bradford on: 04/10/2023 09:11 AM   Modules accepted: Orders

## 2023-04-10 NOTE — Telephone Encounter (Signed)
Hey Dr.[name] it looks like you sent these rx to an alternative pharmacy, please send rx to pharmacy attached to the note.  Pharmacy:[facility] - [place], [place] - [address] (Pharmacy) contacted Me (Fax)

## 2023-04-12 MED ORDER — TRAZODONE HCL 150 MG PO TABS: 150.0000 mg | ORAL_TABLET | Freq: Every day | ORAL | 0 refills | Status: DC

## 2023-04-12 MED ORDER — ATORVASTATIN CALCIUM 10 MG PO TABS: ORAL_TABLET | ORAL | 3 refills | Status: DC

## 2023-04-12 MED ORDER — PANTOPRAZOLE SODIUM 40 MG PO TBEC: DELAYED_RELEASE_TABLET | ORAL | 11 refills | Status: DC

## 2023-04-12 NOTE — Addendum Note (Signed)
Addended by: Lucille Passy on: 04/12/2023 02:33 PM   Modules accepted: Orders

## 2023-04-13 ENCOUNTER — Ambulatory Visit (INDEPENDENT_AMBULATORY_CARE_PROVIDER_SITE_OTHER): Payer: Medicare HMO | Admitting: Internal Medicine

## 2023-04-13 ENCOUNTER — Encounter: Payer: Self-pay | Admitting: Internal Medicine

## 2023-04-13 VITALS — BP 121/75 | HR 80 | Wt 223.7 lb

## 2023-04-13 DIAGNOSIS — R7881 Bacteremia: Secondary | ICD-10-CM

## 2023-04-13 DIAGNOSIS — N3941 Urge incontinence: Secondary | ICD-10-CM

## 2023-04-13 DIAGNOSIS — D696 Thrombocytopenia, unspecified: Secondary | ICD-10-CM

## 2023-04-13 DIAGNOSIS — F419 Anxiety disorder, unspecified: Secondary | ICD-10-CM | POA: Diagnosis not present

## 2023-04-13 DIAGNOSIS — N3946 Mixed incontinence: Secondary | ICD-10-CM

## 2023-04-13 DIAGNOSIS — E119 Type 2 diabetes mellitus without complications: Secondary | ICD-10-CM

## 2023-04-13 DIAGNOSIS — Z7985 Long-term (current) use of injectable non-insulin antidiabetic drugs: Secondary | ICD-10-CM

## 2023-04-13 DIAGNOSIS — B9620 Unspecified Escherichia coli [E. coli] as the cause of diseases classified elsewhere: Secondary | ICD-10-CM

## 2023-04-13 DIAGNOSIS — F411 Generalized anxiety disorder: Secondary | ICD-10-CM

## 2023-04-13 DIAGNOSIS — R7303 Prediabetes: Secondary | ICD-10-CM

## 2023-04-13 DIAGNOSIS — W19XXXA Unspecified fall, initial encounter: Secondary | ICD-10-CM | POA: Insufficient documentation

## 2023-04-13 DIAGNOSIS — N390 Urinary tract infection, site not specified: Secondary | ICD-10-CM

## 2023-04-13 DIAGNOSIS — R296 Repeated falls: Secondary | ICD-10-CM

## 2023-04-13 NOTE — Assessment & Plan Note (Addendum)
Patient describes urinary hesitancy improving shortly after hospital admission, but has noticed this worsening over the last few weeks. She denies fever, changes in appetite, dysuria. On exam, she does not have suprapubic or CVA tenderness. When admitted, her blood and urine cultures grew E. Coli, indicative of a complicated UTI.   P: With return of urinary hesitancy, concerned for infection and will repeat UA with culture.
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
## 2023-04-13 NOTE — Assessment & Plan Note (Addendum)
Pre- diabetes with A1C 5.6 2 months ago. She reports taking 1 mg Ozempic also for weight loss. She is down 17 pounds since 11/2022.  P: continue 1mg  ozempic.  Repeat A1c in 1 year

## 2023-04-13 NOTE — Patient Instructions (Addendum)
Thank you, Ms.Rica Ribar Ke for allowing [place] to provide your care today.  Falls: I am referring you to physical therapy. I want them to help build your strength and confidence back up with walking. You should receive a call about this soon.  Difficulty holding urine: Try to decrease amount of caffeine you are drinking to see if this helps with bladder incontinence. I will call with results from urine sample.   Depression: Please continue following with Melony Overly. If you have thoughts of hurting yourself, please call [phone].    Low platelets: I am repeating some blood work. Your platelets which are part of the blood that stops bleeding is low and I want to try to understand why.  Diabetes: Please continue taking ozempic 1 mg weekly. We can recheck A1c a follow-up soon.  I have ordered the following labs for you:  Lab Orders         Microalbumin / Creatinine Urine Ratio         CBC with Diff         Pathologist smear review         Urinalysis, Reflex Microscopic      Referrals ordered today:   Referral Orders         Ambulatory referral to Physical Therapy       I have ordered the following medication/changed the following medications:   Stop the following medications: Medications Discontinued During This Encounter  Medication Reason   amoxicillin-clavulanate (AUGMENTIN) 875-125 MG tablet    nystatin cream (MYCOSTATIN)      Start the following medications: No orders of the defined types were placed in this encounter.    Follow up:  2 weeks    We look forward to seeing you next time. Please call our clinic at [phone] if you have any questions or concerns. The best time to call is Monday-Friday from 9am-4pm, but there is someone available 24/7. If after hours or the weekend, call the main hospital number and ask for the Internal Medicine Resident On-Call. If you need medication refills, please notify your pharmacy one week in advance and they will send [place] a  request.   Thank you for trusting me with your care. Wishing you the best!   Rudene Christians, DO [facility]

## 2023-04-13 NOTE — Assessment & Plan Note (Addendum)
Last platelet count done was 84, she has had chronically low platelet counts in the 120-130 range since 2020. She mentions she notices that she bruises more easily. She denies alcohol use, which could cause thrombocytopenia. No petechiae visible on skin.   P: Today will do blood smear which includes CBC to assess thrombocytopenia
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
## 2023-04-13 NOTE — Assessment & Plan Note (Signed)
She is currently working with Melony Overly, PA, who prescribes her Xanax and Zoloft. She estimates taking Xanax 3 times a day and states that it does not completely help to alleviate her fear of falling. She endorses feeling more frustrated, especially since she feels she is losing her independence with the frequency of the falls she has. Her GAD7 is 20 and her PHQ is 17 today. She endorses SI that is related to frustrations and arguments within her family. She does not have a plan, and she does not have guns in her home. She prays to alleviate these thoughts.   P: she is encouraged to continue to follow up with Melony Overly, PA. We have relayed to her resources to reach out to if she has these thoughts that cannot be alleviated.

## 2023-04-13 NOTE — Assessment & Plan Note (Addendum)
She has noticed that since her admission, she has had increased falls and fear of falling. She does not describe dizziness or the room spinning, but she notices she gets nervous and her legs shake when she is about to fall. She has fallen 3 times in the last month and uses a cane to ambulate. Her orthostatics were not indicative of orthostatic hypotension. On physical exam, slight horizontal nystagmus is noted. She did not have a shuffling gait observed on exam. Given her recent hospital admission, there is suspicion for deconditioning.  She is also on several medications including alprazolam and trazodone that likely predispose her for falls.  However would not change those medications at this time. She denies chest pain or SOB associated with these falls, lowering suspicion for a cardiac cause.   P: Referral for PT sent to increase walking strength and confidence in the case of deconditioning. F/u 2 weeks

## 2023-04-14 DIAGNOSIS — R32 Unspecified urinary incontinence: Secondary | ICD-10-CM | POA: Insufficient documentation

## 2023-04-14 LAB — URINALYSIS, ROUTINE W REFLEX MICROSCOPIC
Bilirubin, UA: NEGATIVE
Glucose, UA: NEGATIVE
Ketones, UA: NEGATIVE
Leukocytes,UA: NEGATIVE
Nitrite, UA: POSITIVE — AB
Protein,UA: NEGATIVE
RBC, UA: NEGATIVE
Specific Gravity, UA: 1.013 (ref 1.005–1.030)
Urobilinogen, Ur: 0.2 mg/dL (ref 0.2–1.0)
pH, UA: 5.5 (ref 5.0–7.5)

## 2023-04-14 LAB — PATHOLOGIST SMEAR REVIEW
Basophils Absolute: 0 10*3/uL (ref 0.0–0.2)
Immature Granulocytes: 0 %
Lymphocytes Absolute: 1.8 10*3/uL (ref 0.7–3.1)
MCV: 91 fL (ref 79–97)
Monocytes: 5 %
Neutrophils Absolute: 6.1 10*3/uL (ref 1.4–7.0)
Platelets: 139 10*3/uL — ABNORMAL LOW (ref 150–450)

## 2023-04-14 LAB — MICROSCOPIC EXAMINATION
Casts: NONE SEEN /lpf
RBC, Urine: NONE SEEN /hpf (ref 0–2)

## 2023-04-14 LAB — MICROALBUMIN / CREATININE URINE RATIO
Creatinine, Urine: 60.6 mg/dL
Microalb/Creat Ratio: <5 mg/g creat (ref 0–29)
Microalbumin, Urine: <3 ug/mL

## 2023-04-14 NOTE — Assessment & Plan Note (Signed)
Difficulty with urine leaking with sneezing, coughing for last several months. She also feels like she has to go to bathroom but can't go.  She drinks 8 cups of coffee and 2 20 oz diet mt dws daily, about 950 mg of caffeine daily. She has some constipation occasionally but uses miralax for this. A: Mixed incontinence P: UA I encouraged her to cut down on caffeine, by at lease 1 cup of coffee weekly to see if that helps with symptoms Lifestyle modification with decreased consumption of caffeine and treatment of constipation, trialed for a minimum of 6 weeks.   Addendum: Urine cx with >100,000 gram negative rods Culture grew Klebsiella  Bactrim DS BID for 3 days sent on 6/8
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
## 2023-04-15 MED ORDER — SULFAMETHOXAZOLE-TRIMETHOPRIM 800-160 MG PO TABS
1.0000 | ORAL_TABLET | Freq: Two times a day (BID) | ORAL | 0 refills | Status: DC
Start: 2023-04-15 — End: 2023-04-27

## 2023-04-15 NOTE — Addendum Note (Signed)
Addended by: Lucille Passy on: 04/15/2023 10:51 AM   Modules accepted: Orders

## 2023-04-15 NOTE — Addendum Note (Signed)
Addended by: Lucille Passy on: 04/15/2023 10:45 AM   Modules accepted: Orders

## 2023-04-16 LAB — URINE CULTURE

## 2023-04-17 LAB — PATHOLOGIST SMEAR REVIEW
Basos: 0 %
EOS (ABSOLUTE): 0.1 10*3/uL (ref 0.0–0.4)
Eos: 1 %
Hematocrit: 46.7 % — ABNORMAL HIGH (ref 34.0–46.6)
Hemoglobin: 16.2 g/dL — ABNORMAL HIGH (ref 11.1–15.9)
Immature Grans (Abs): 0 10*3/uL (ref 0.0–0.1)
Lymphs: 21 %
MCH: 31.5 pg (ref 26.6–33.0)
MCHC: 34.7 g/dL (ref 31.5–35.7)
Monocytes Absolute: 0.5 10*3/uL (ref 0.1–0.9)
Neutrophils: 73 %
RBC: 5.15 x10E6/uL (ref 3.77–5.28)
RDW: 13.4 % (ref 11.7–15.4)
WBC: 8.4 10*3/uL (ref 3.4–10.8)

## 2023-04-17 NOTE — Progress Notes (Signed)
[facility] Attending  Case discussed with Dr. [name]  At the time of the visit.  We reviewed the resident's history and exam and pertinent patient test results.  I agree with the assessment, diagnosis, and plan of care documented in the resident's note.  

## 2023-04-20 ENCOUNTER — Emergency Department (HOSPITAL_COMMUNITY): Payer: Medicare HMO

## 2023-04-20 ENCOUNTER — Encounter (HOSPITAL_COMMUNITY): Payer: Self-pay

## 2023-04-20 ENCOUNTER — Other Ambulatory Visit: Payer: Self-pay

## 2023-04-20 ENCOUNTER — Emergency Department (HOSPITAL_COMMUNITY)
Admission: EM | Admit: 2023-04-20 | Discharge: 2023-04-21 | Disposition: A | Discharge status: Home / Self Care | Payer: Medicare HMO | Attending: Emergency Medicine | Admitting: Emergency Medicine

## 2023-04-20 DIAGNOSIS — S0990XA Unspecified injury of head, initial encounter: Secondary | ICD-10-CM | POA: Diagnosis not present

## 2023-04-20 DIAGNOSIS — W01198A Fall on same level from slipping, tripping and stumbling with subsequent striking against other object, initial encounter: Secondary | ICD-10-CM | POA: Diagnosis not present

## 2023-04-20 DIAGNOSIS — Z23 Encounter for immunization: Secondary | ICD-10-CM | POA: Insufficient documentation

## 2023-04-20 DIAGNOSIS — Z794 Long term (current) use of insulin: Secondary | ICD-10-CM | POA: Diagnosis not present

## 2023-04-20 DIAGNOSIS — M542 Cervicalgia: Secondary | ICD-10-CM | POA: Diagnosis not present

## 2023-04-20 DIAGNOSIS — M25552 Pain in left hip: Secondary | ICD-10-CM | POA: Insufficient documentation

## 2023-04-20 DIAGNOSIS — S0083XA Contusion of other part of head, initial encounter: Secondary | ICD-10-CM

## 2023-04-20 DIAGNOSIS — R079 Chest pain, unspecified: Secondary | ICD-10-CM

## 2023-04-20 DIAGNOSIS — S00212A Abrasion of left eyelid and periocular area, initial encounter: Secondary | ICD-10-CM | POA: Insufficient documentation

## 2023-04-20 DIAGNOSIS — M546 Pain in thoracic spine: Secondary | ICD-10-CM | POA: Diagnosis not present

## 2023-04-20 DIAGNOSIS — S0011XA Contusion of right eyelid and periocular area, initial encounter: Secondary | ICD-10-CM | POA: Insufficient documentation

## 2023-04-20 DIAGNOSIS — M25512 Pain in left shoulder: Secondary | ICD-10-CM | POA: Diagnosis not present

## 2023-04-20 DIAGNOSIS — W19XXXA Unspecified fall, initial encounter: Secondary | ICD-10-CM

## 2023-04-20 DIAGNOSIS — R0789 Other chest pain: Secondary | ICD-10-CM | POA: Insufficient documentation

## 2023-04-20 LAB — CBC
HCT: 47.1 % — ABNORMAL HIGH (ref 36.0–46.0)
Hemoglobin: 16.2 g/dL — ABNORMAL HIGH (ref 12.0–15.0)
MCH: 32.1 pg (ref 26.0–34.0)
MCHC: 34.4 g/dL (ref 30.0–36.0)
MCV: 93.3 fL (ref 80.0–100.0)
Platelets: 137 10*3/uL — ABNORMAL LOW (ref 150–400)
RBC: 5.05 MIL/uL (ref 3.87–5.11)
RDW: 13.8 % (ref 11.5–15.5)
WBC: 9.1 10*3/uL (ref 4.0–10.5)
nRBC: 0 % (ref 0.0–0.2)

## 2023-04-20 LAB — URINALYSIS, ROUTINE W REFLEX MICROSCOPIC
Bilirubin Urine: NEGATIVE
Glucose, UA: NEGATIVE mg/dL
Hgb urine dipstick: NEGATIVE
Ketones, ur: NEGATIVE mg/dL
Leukocytes,Ua: NEGATIVE
Nitrite: NEGATIVE
Protein, ur: NEGATIVE mg/dL
Specific Gravity, Urine: 1.02 (ref 1.005–1.030)
pH: 6 (ref 5.0–8.0)

## 2023-04-20 LAB — BASIC METABOLIC PANEL
Anion gap: 11 (ref 5–15)
BUN: 7 mg/dL — ABNORMAL LOW (ref 8–23)
CO2: 24 mmol/L (ref 22–32)
Calcium: 9.7 mg/dL (ref 8.9–10.3)
Chloride: 101 mmol/L (ref 98–111)
Creatinine, Ser: 0.9 mg/dL (ref 0.44–1.00)
GFR, Estimated: >60 mL/min (ref >60)
Glucose, Bld: 109 mg/dL — ABNORMAL HIGH (ref 70–99)
Potassium: 3.5 mmol/L (ref 3.5–5.1)
Sodium: 136 mmol/L (ref 135–145)

## 2023-04-20 LAB — TROPONIN I (HIGH SENSITIVITY): Troponin I (High Sensitivity): 7 ng/L (ref <18)

## 2023-04-20 LAB — HEPATIC FUNCTION PANEL
ALT: 8 U/L (ref 0–44)
AST: 13 U/L — ABNORMAL LOW (ref 15–41)
Albumin: 3.6 g/dL (ref 3.5–5.0)
Alkaline Phosphatase: 92 U/L (ref 38–126)
Bilirubin, Direct: 0.1 mg/dL (ref 0.0–0.2)
Indirect Bilirubin: 0.4 mg/dL (ref 0.3–0.9)
Total Bilirubin: 0.5 mg/dL (ref 0.3–1.2)
Total Protein: 6.2 g/dL — ABNORMAL LOW (ref 6.5–8.1)

## 2023-04-20 MED ORDER — OXYCODONE-ACETAMINOPHEN 5-325 MG PO TABS
1.0000 | ORAL_TABLET | Freq: Once | ORAL | Status: AC
  Administered 2023-04-21: 1 via ORAL
  Filled 2023-04-20: qty 1

## 2023-04-20 MED ORDER — TETANUS-DIPHTH-ACELL PERTUSSIS 5-2.5-18.5 LF-MCG/0.5 IM SUSY
0.5000 mL | PREFILLED_SYRINGE | Freq: Once | INTRAMUSCULAR | Status: AC
  Administered 2023-04-21: 0.5 mL via INTRAMUSCULAR
  Filled 2023-04-20: qty 0.5

## 2023-04-20 NOTE — ED Provider Notes (Signed)
[place] EMERGENCY DEPARTMENT AT [facility] Provider Note   CSN: [id number] Arrival date & time: 04/20/23  2144     History  Chief Complaint  Patient presents with   Fall   Chest Pain    [name] [name] is a 67 y.o. female, history of aortic atherosclerosis, who presents to the ED secondary to a fall that occurred earlier today.  She states she has had 2 falls in the last 3 days.  1 fall occurred 3 days ago, when she was standing up in the kitchen, and getting rid of a bowl of soup, when she became very shaky, had chest pain that was described as tightness, and became short of breath, and fell on her left side.  She did not hit her head, but did hurt her left arm.  States that her husband got her up, and that the chest pain, shortness of breath, and shakiness lasted for about 10 minutes then resolved.  Then today she was getting into the bathtub, slipped on the mat, and started having some chest pain, shortness of breath, and shakiness again, this lasted again 10 more minutes.  Resolved.  She hit the side of the bathtub, and hit her head, face, and neck.  She states her pain is mostly in her neck, and is in the 7 out of 10, when she is not moving.  Also reports upper back pain.  Is not on any blood thinners, did not have any loss of consciousness neither episodes.  States that she has been very anxious because she has been falling out more of late.  She does not know why.  States she just loses her balance.  Unknown last tetanus.  No changes in vision, confusion, weakness on one side of the body.    Home Medications Prior to Admission medications   Medication Sig Start Date End Date Taking? Authorizing Provider  sertraline (ZOLOFT) 100 MG tablet TAKE 3 TABLETS(300 MG) BY MOUTH DAILY 03/12/23   [name], PA-C  acetaminophen (TYLENOL) 500 MG tablet Take 1,000 mg by mouth every 6 (six) hours as needed (for pain OR back pain).     [provider]  albuterol  (VENTOLIN HFA) 108 (90 Base) MCG/ACT inhaler Inhale 2 puffs into the lungs every 6 (six) hours as needed for wheezing or shortness of breath. 06/16/20   Remo Lipps, MD  ALPRAZolam Prudy Feeler) 1 MG tablet TAKE 1/2 TO 1 TABLET(0.5 TO 1 MG) BY MOUTH THREE TIMES DAILY AS NEEDED FOR ANXIETY Patient taking differently: Take 1 mg by mouth at bedtime. 01/18/23   [name], PA-C  atorvastatin (LIPITOR) 10 MG tablet TAKE 1 TABLET(10 MG) BY MOUTH DAILY 04/12/23   [name], [name], DO  gabapentin (NEURONTIN) 100 MG capsule TAKE 1 CAPSULE(100 MG) BY MOUTH THREE TIMES DAILY 03/14/22   Vivi Barrack, DPM  Insulin Pen Needle (PEN NEEDLES) 32G X 4 MM MISC 1 Needle by Does not apply route daily at 6 (six) AM. 12/12/22   Merrilyn Puma, MD  pantoprazole (PROTONIX) 40 MG tablet TAKE 1 TABLET(40 MG) BY MOUTH DAILY 04/12/23   [name], [name], DO  sulfamethoxazole-trimethoprim (BACTRIM DS) 800-160 MG tablet Take 1 tablet by mouth 2 (two) times daily. 04/15/23   [name], [name], DO  traZODone (DESYREL) 150 MG tablet Take 1 tablet (150 mg total) by mouth at bedtime. 04/12/23   [name], [name], DO      Allergies    Ibuprofen and Clonazepam    Review of  Systems   Review of Systems  Constitutional:  Negative for fever.  Respiratory:  Positive for shortness of breath. Negative for cough.   Cardiovascular:  Positive for chest pain.    Physical Exam Updated Vital Signs BP (!) 92/57 (BP Location: Right Arm)   Pulse 71   Temp 97.8 F (36.6 C) (Oral)   Resp 16   Ht 5\' 8"  (1.727 m)   Wt 101.2 kg   SpO2 92%   BMI 33.91 kg/m  Physical Exam Vitals and nursing note reviewed.  Constitutional:      General: She is not in acute distress.    Appearance: She is well-developed.  HENT:     Head: Normocephalic and atraumatic.     Comments: Hematoma to right eyebrow, tenderness to palpation of left and right orbits.  No nasal bone tenderness.  No blood in oropharynx.  No hemotympanums, negative for septal hematoma. +abrasion to L  upper eyelid Eyes:     Extraocular Movements: Extraocular movements intact.     Conjunctiva/sclera: Conjunctivae normal.     Pupils: Pupils are equal, round, and reactive to light.  Neck:     Comments: Tenderness to palpation of cervical and thoracic spine.  Pain with range of motion.  Range of motion however is intact. Cardiovascular:     Rate and Rhythm: Normal rate and regular rhythm.     Heart sounds: No murmur heard. Pulmonary:     Effort: Pulmonary effort is normal. No respiratory distress.     Breath sounds: Normal breath sounds.  Abdominal:     Palpations: Abdomen is soft.     Tenderness: There is no abdominal tenderness.  Musculoskeletal:        General: No swelling.     Comments: Tenderness to palpation of left humeral head.  No step-offs noted to clavicle, or spine.  Range of motion intact 4 extremities.  Equal strength of bilateral upper and bilateral lower extremities.  Skin:    General: Skin is warm and dry.     Capillary Refill: Capillary refill takes less than 2 seconds.  Neurological:     Mental Status: She is alert.  Psychiatric:        Mood and Affect: Mood normal.     ED Results / Procedures / Treatments   Labs (all labs ordered are listed, but only abnormal results are displayed) Labs Reviewed  BASIC METABOLIC PANEL - Abnormal; Notable for the following components:      Result Value   Glucose, Bld 109 (*)    BUN 7 (*)    All other components within normal limits  CBC - Abnormal; Notable for the following components:   Hemoglobin 16.2 (*)    HCT 47.1 (*)    Platelets 137 (*)    All other components within normal limits  HEPATIC FUNCTION PANEL - Abnormal; Notable for the following components:   Total Protein 6.2 (*)    AST 13 (*)    All other components within normal limits  URINALYSIS, ROUTINE W REFLEX MICROSCOPIC  TROPONIN I (HIGH SENSITIVITY)    Radiology CT Thoracic Spine Wo Contrast  Result Date: 04/21/2023 CLINICAL DATA:  Larey Seat in the  bathtub EXAM: CT THORACIC SPINE WITHOUT CONTRAST TECHNIQUE: Multidetector CT images of the thoracic were obtained using the standard protocol without intravenous contrast. RADIATION DOSE REDUCTION: This exam was performed according to the departmental dose-optimization program which includes automated exposure control, adjustment of the mA and/or kV according to patient size and/or use of iterative  reconstruction technique. COMPARISON:  No prior CT of the thoracic spine, correlation is made with CT chest 02/07/2023 FINDINGS: Evaluation is somewhat limited by motion and body habitus. Alignment: S-shaped curvature of the thoracolumbar spine, with levocurvature the midthoracic spine and dextrocurvature of the thoracolumbar junction and imaged lumbar spine. No listhesis. Vertebrae: No acute fracture or focal pathologic process. Paraspinal and other soft tissues: Aortic atherosclerosis. No focal pulmonary opacity or pleural effusion. Disc levels: No significant spinal canal stenosis. IMPRESSION: Evaluation is somewhat limited by motion and body habitus. Within this limitation, no acute fracture or traumatic listhesis. Aortic Atherosclerosis (ICD10-I70.0). Electronically Signed   By: Wiliam Ke M.D.   On: 04/21/2023 00:13   CT Maxillofacial Wo Contrast  Result Date: 04/21/2023 CLINICAL DATA:  Fall EXAM: CT HEAD WITHOUT CONTRAST CT MAXILLOFACIAL WITHOUT CONTRAST CT CERVICAL SPINE WITHOUT CONTRAST TECHNIQUE: Multidetector CT imaging of the head, cervical spine, and maxillofacial structures were performed using the standard protocol without intravenous contrast. Multiplanar CT image reconstructions of the cervical spine and maxillofacial structures were also generated. RADIATION DOSE REDUCTION: This exam was performed according to the departmental dose-optimization program which includes automated exposure control, adjustment of the mA and/or kV according to patient size and/or use of iterative reconstruction  technique. COMPARISON:  06/03/2020 CT head and cervical spine FINDINGS: CT HEAD FINDINGS Brain: No evidence of acute infarct, hemorrhage, mass, mass effect, or midline shift. No hydrocephalus or extra-axial fluid collection. Vascular: No hyperdense vessel. Skull: Negative for fracture or focal lesion. CT MAXILLOFACIAL FINDINGS Osseous: No fracture or mandibular dislocation. No destructive process. Orbits: No traumatic or inflammatory finding. Sinuses: Clear. Soft tissues: Negative. CT CERVICAL SPINE FINDINGS Alignment: No listhesis. Dextrocurvature, which may in part be positional. Skull base and vertebrae: No acute fracture. No primary bone lesion or focal pathologic process. Soft tissues and spinal canal: No prevertebral fluid or swelling. No visible canal hematoma. Disc levels: Degenerative changes in the cervical spine. No high-grade spinal canal stenosis. Upper chest: No focal pulmonary opacity or pleural effusion. Other: None. IMPRESSION: 1. No acute intracranial process. 2. No acute facial bone fracture. 3. No acute fracture or traumatic listhesis in the cervical spine. Electronically Signed   By: Wiliam Ke M.D.   On: 04/21/2023 00:09   CT Head Wo Contrast  Result Date: 04/21/2023 CLINICAL DATA:  Fall EXAM: CT HEAD WITHOUT CONTRAST CT MAXILLOFACIAL WITHOUT CONTRAST CT CERVICAL SPINE WITHOUT CONTRAST TECHNIQUE: Multidetector CT imaging of the head, cervical spine, and maxillofacial structures were performed using the standard protocol without intravenous contrast. Multiplanar CT image reconstructions of the cervical spine and maxillofacial structures were also generated. RADIATION DOSE REDUCTION: This exam was performed according to the departmental dose-optimization program which includes automated exposure control, adjustment of the mA and/or kV according to patient size and/or use of iterative reconstruction technique. COMPARISON:  06/03/2020 CT head and cervical spine FINDINGS: CT HEAD FINDINGS  Brain: No evidence of acute infarct, hemorrhage, mass, mass effect, or midline shift. No hydrocephalus or extra-axial fluid collection. Vascular: No hyperdense vessel. Skull: Negative for fracture or focal lesion. CT MAXILLOFACIAL FINDINGS Osseous: No fracture or mandibular dislocation. No destructive process. Orbits: No traumatic or inflammatory finding. Sinuses: Clear. Soft tissues: Negative. CT CERVICAL SPINE FINDINGS Alignment: No listhesis. Dextrocurvature, which may in part be positional. Skull base and vertebrae: No acute fracture. No primary bone lesion or focal pathologic process. Soft tissues and spinal canal: No prevertebral fluid or swelling. No visible canal hematoma. Disc levels: Degenerative changes in the cervical spine. No high-grade  spinal canal stenosis. Upper chest: No focal pulmonary opacity or pleural effusion. Other: None. IMPRESSION: 1. No acute intracranial process. 2. No acute facial bone fracture. 3. No acute fracture or traumatic listhesis in the cervical spine. Electronically Signed   By: Wiliam Ke M.D.   On: 04/21/2023 00:09   CT Cervical Spine Wo Contrast  Result Date: 04/21/2023 CLINICAL DATA:  Fall EXAM: CT HEAD WITHOUT CONTRAST CT MAXILLOFACIAL WITHOUT CONTRAST CT CERVICAL SPINE WITHOUT CONTRAST TECHNIQUE: Multidetector CT imaging of the head, cervical spine, and maxillofacial structures were performed using the standard protocol without intravenous contrast. Multiplanar CT image reconstructions of the cervical spine and maxillofacial structures were also generated. RADIATION DOSE REDUCTION: This exam was performed according to the departmental dose-optimization program which includes automated exposure control, adjustment of the mA and/or kV according to patient size and/or use of iterative reconstruction technique. COMPARISON:  06/03/2020 CT head and cervical spine FINDINGS: CT HEAD FINDINGS Brain: No evidence of acute infarct, hemorrhage, mass, mass effect, or midline  shift. No hydrocephalus or extra-axial fluid collection. Vascular: No hyperdense vessel. Skull: Negative for fracture or focal lesion. CT MAXILLOFACIAL FINDINGS Osseous: No fracture or mandibular dislocation. No destructive process. Orbits: No traumatic or inflammatory finding. Sinuses: Clear. Soft tissues: Negative. CT CERVICAL SPINE FINDINGS Alignment: No listhesis. Dextrocurvature, which may in part be positional. Skull base and vertebrae: No acute fracture. No primary bone lesion or focal pathologic process. Soft tissues and spinal canal: No prevertebral fluid or swelling. No visible canal hematoma. Disc levels: Degenerative changes in the cervical spine. No high-grade spinal canal stenosis. Upper chest: No focal pulmonary opacity or pleural effusion. Other: None. IMPRESSION: 1. No acute intracranial process. 2. No acute facial bone fracture. 3. No acute fracture or traumatic listhesis in the cervical spine. Electronically Signed   By: Wiliam Ke M.D.   On: 04/21/2023 00:09   DG Hip Unilat W or Wo Pelvis 2-3 Views Left  Result Date: 04/20/2023 CLINICAL DATA:  Recent fall with left hip pain, initial encounter EXAM: DG HIP (WITH OR WITHOUT PELVIS) 3V LEFT COMPARISON:  None Available. FINDINGS: Pelvic ring is intact. No acute fracture or dislocation is noted. No soft tissue abnormality is seen. IMPRESSION: No acute abnormality noted. Electronically Signed   By: Alcide Clever M.D.   On: 04/20/2023 23:51   DG Shoulder Right  Result Date: 04/20/2023 CLINICAL DATA:  Status post fall. EXAM: RIGHT SHOULDER - 2+ VIEW COMPARISON:  None Available. FINDINGS: There is no evidence of fracture or dislocation. There is no evidence of arthropathy or other focal bone abnormality. Soft tissues are unremarkable. IMPRESSION: Negative. Electronically Signed   By: Aram Candela M.D.   On: 04/20/2023 23:00   DG Humerus Right  Result Date: 04/20/2023 CLINICAL DATA:  Status post multiple falls. EXAM: RIGHT HUMERUS - 2+  VIEW COMPARISON:  None Available. FINDINGS: There is no evidence of fracture or other focal bone lesions. Soft tissues are unremarkable. IMPRESSION: Negative. Electronically Signed   By: Aram Candela M.D.   On: 04/20/2023 22:58   DG Cervical Spine 2-3 Views  Result Date: 04/20/2023 CLINICAL DATA:  Status post multiple falls. EXAM: CERVICAL SPINE - 2-3 VIEW COMPARISON:  None Available. FINDINGS: The C7 vertebral body is limited in visualization on the lateral view. There is no evidence of cervical spine fracture or prevertebral soft tissue swelling. Alignment is normal. Moderate to marked severity posterior intervertebral disc space narrowing and posterior bony spurring are seen at the level of C3-C4. No other significant  bone abnormalities are identified. IMPRESSION: 1. Limited study, as described above, without evidence of acute cervical spine fracture. 2. Moderate to marked severity degenerative disc disease at the level of C3-C4. Electronically Signed   By: Aram Candela M.D.   On: 04/20/2023 22:54   DG Chest 2 View  Result Date: 04/20/2023 CLINICAL DATA:  Status post multiple falls. EXAM: CHEST - 2 VIEW COMPARISON:  February 06, 2023 FINDINGS: The heart size and mediastinal contours are within normal limits. Mild to moderate severity calcification of the aortic arch is seen. Both lungs are clear. The visualized skeletal structures are unremarkable. IMPRESSION: No active cardiopulmonary disease. Electronically Signed   By: Aram Candela M.D.   On: 04/20/2023 22:50    Procedures Procedures    Medications Ordered in ED Medications  oxyCODONE-acetaminophen (PERCOCET/ROXICET) 5-325 MG per tablet 1 tablet (1 tablet Oral Given 04/21/23 0006)  Tdap (BOOSTRIX) injection 0.5 mL (0.5 mLs Intramuscular Given 04/21/23 0007)  LORazepam (ATIVAN) tablet 0.5 mg (0.5 mg Oral Given 04/21/23 0131)    ED Course/ Medical Decision Making/ A&P             HEART Score: 4                Medical Decision  Making Patient is a 67 year old female, here for 2 falls in the last 3 days, she complains of neck, head, back pain, she does have a little laceration to her left eyelid, however this is healed over.  We obtained her tetanus, given unknown last tetanus, and open wound.  Additionally complains of some chest pain, we will obtain troponins, for further evaluation as well as CTs of the face, head, neck, and thoracic spine.  Additionally obtain a right shoulder, humerus given the tenderness to palpation of this area and hip x-ray given that she has mild tenderness to palpation of her left hip.  Percocet for pain  Amount and/or Complexity of Data Reviewed Labs: ordered.    Details: Unremarkable, negative troponin Radiology: ordered.    Details: CT showed no acute findings such as fracture, head bleed.  X-rays show no fractures. Discussion of management or test interpretation with external provider(s): To gust with patient, her head CT, neck CT, thoracic CT and face CT are all unremarkable.  She has no evidence of a septal hematoma, or hemotympanums.  She has normal blood work, and negative troponin.  Her x-rays are unremarkable as well.  She had her tetanus updated given the Kishan Wachsmuth laceration to her eyelid.  It did not need to be repaired as it was so superficial and scabbed over.  She was able to ambulate well, and was discharged home with strict return precautions and follow-up with primary care doctor.  She has a walker at home to help provide stability.  Risk Prescription drug management.   Final Clinical Impression(s) / ED Diagnoses Final diagnoses:  Fall, initial encounter  Chest pain, unspecified type  Neck pain  Acute midline thoracic back pain  Facial hematoma, initial encounter    Rx / DC Orders ED Discharge Orders     None         Amadu Schlageter, [place], PA 04/21/23 0500    Sabas Sous, MD 04/21/23 [phone]

## 2023-04-20 NOTE — ED Triage Notes (Signed)
Pt fell in bathtub striking face, head, and right side of body. Pt c/o right shoulder/upper arm pain and left hip pain. Pt also fell yesterday on porch.

## 2023-04-21 MED ORDER — LORAZEPAM 1 MG PO TABS
0.5000 mg | ORAL_TABLET | Freq: Once | ORAL | Status: AC
  Administered 2023-04-21: 0.5 mg via ORAL
  Filled 2023-04-21: qty 1

## 2023-04-21 NOTE — ED Notes (Signed)
[phone] Pt husband called for a update

## 2023-04-21 NOTE — Discharge Instructions (Addendum)
Please  follow-up with your PCP.  The cause of your falls is unknown.  However your heart looks good today, as well as your CT scans.  There is no evidence of any kind of fractures.  We did update your tetanus given the laceration to your eyelid.  If you develop any kind of redness, swelling, difficulty walking please return to the ER.

## 2023-04-21 NOTE — ED Notes (Signed)
Patient able to ambulate without difficulty with use of a [employer]

## 2023-04-21 NOTE — ED Notes (Signed)
Pt ambulated using a walker. Pt able to ambulate in the hallway with steady gait, no complaints at this time.

## 2023-04-27 ENCOUNTER — Encounter: Payer: Self-pay | Admitting: Internal Medicine

## 2023-04-27 ENCOUNTER — Ambulatory Visit (INDEPENDENT_AMBULATORY_CARE_PROVIDER_SITE_OTHER): Payer: Medicare HMO | Admitting: Internal Medicine

## 2023-04-27 ENCOUNTER — Other Ambulatory Visit: Payer: Self-pay

## 2023-04-27 VITALS — BP 118/65 | HR 87 | Temp 98.2°F | Ht 68.0 in | Wt 221.5 lb

## 2023-04-27 DIAGNOSIS — F411 Generalized anxiety disorder: Secondary | ICD-10-CM

## 2023-04-27 DIAGNOSIS — W19XXXS Unspecified fall, sequela: Secondary | ICD-10-CM

## 2023-04-27 DIAGNOSIS — W19XXXD Unspecified fall, subsequent encounter: Secondary | ICD-10-CM

## 2023-04-27 MED ORDER — BUSPIRONE HCL 5 MG PO TABS
5.0000 mg | ORAL_TABLET | Freq: Two times a day (BID) | ORAL | 11 refills | Status: DC
Start: 2023-04-27 — End: 2023-04-27

## 2023-04-27 MED ORDER — BUSPIRONE HCL 5 MG PO TABS: 5.0000 mg | ORAL_TABLET | Freq: Three times a day (TID) | ORAL | 11 refills | Status: DC

## 2023-04-27 NOTE — Patient Instructions (Addendum)
Thank you, [name] for allowing [place] to provide your care today.   Falls due to anxiety Please go back to [facility]. I am concerned about increasing dose of Xanax as this can put you at higher risk of falling. Continue taking sertraline and I am starting another medication called Buspar, takes this 3 x daily. Stop taking trazodone.  Follow-up in 1 month  I have ordered the following medication/changed the following medications:   Stop the following medications: Medications Discontinued During This Encounter  Medication Reason   sulfamethoxazole-trimethoprim (BACTRIM DS) 800-160 MG tablet    busPIRone (BUSPAR) 5 MG tablet Reorder   traZODone (DESYREL) 150 MG tablet      Start the following medications: Meds ordered this encounter  Medications   DISCONTD: busPIRone (BUSPAR) 5 MG tablet    Sig: Take 1 tablet (5 mg total) by mouth 2 (two) times daily.    Dispense:  60 tablet    Refill:  11   busPIRone (BUSPAR) 5 MG tablet    Sig: Take 1 tablet (5 mg total) by mouth 3 (three) times daily.    Dispense:  90 tablet    Refill:  11     Follow up:  4 weeks    We look forward to seeing you next time. Please call our clinic at [phone] if you have any questions or concerns. The best time to call is Monday-Friday from 9am-4pm, but there is someone available 24/7. If after hours or the weekend, call the main hospital number and ask for the Internal Medicine Resident On-Call. If you need medication refills, please notify your pharmacy one week in advance and they will send [place] a request.   Thank you for trusting me with your care. Wishing you the best!   Rudene Christians, DO [facility]

## 2023-04-28 NOTE — Assessment & Plan Note (Signed)
She has had 3 falls in last 2 weeks, all ground level in her house. Prior to falling each time she felt severe anxiety about falling that progressed to her limbs shaking. She denies dizziness or leg weakness. After most recent fall she had chest pain with breathing. She felt like she couldn't get enough oxygen. When she was evaluated in ED, troponin were <10 x2 and EKG did not have ischemic changes. Imaging was completed that showed no fractures.  On exam she continues to have some horizontal nystagmus. A: From description of episodes, I think falls are most likely related to anxiety. She does not have pro dromal symptoms concerning for pre-syncope and denies dizziness concerning for vestibular dysfunction. Her gait is antalgic with bilateral out going toes but no signs concerning for movement disorder such as parkinsons. Orthostatics negative at last office visit. P: Referred to PT at last visit but has not started with them yet. Start buspar to help with anxiety. She is on several centrally acting medications including gabapentin, xanax, sertraline, and trazodone. Patient wanted something stronger than xanax to help with anxiety but I am concerned that additional medications could increase risk of falling. DC trazodone.
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
## 2023-04-28 NOTE — Assessment & Plan Note (Signed)
She continues to have anxiety about falling that leads to limb shaking and falls. During most recent fall she has central chest pain after the fall and states that she "went all to pieces" cause of how upset she was due to falling. She currently takes xanax 3 times daily. P: Start buspirone 5 mg TID Continue sertraline 300 mg Continue xanax prn, follows with psychiatry for this.  Stop trazodone

## 2023-04-30 NOTE — Addendum Note (Signed)
Addended by: Dickie La on: 04/30/2023 11:51 AM   Modules accepted: Level of Service

## 2023-04-30 NOTE — Progress Notes (Signed)
[facility] Attending  Case discussed with Dr. [name]  At the time of the visit.  We reviewed the resident's history and exam and pertinent patient test results.  I agree with the assessment, diagnosis, and plan of care documented in the resident's note.  

## 2023-05-17 ENCOUNTER — Other Ambulatory Visit: Payer: Self-pay | Admitting: Physician Assistant

## 2023-05-22 ENCOUNTER — Ambulatory Visit: Payer: Medicare HMO | Attending: Physical Therapy | Admitting: Physical Therapy

## 2023-05-22 ENCOUNTER — Telehealth: Payer: Self-pay | Admitting: Physical Therapy

## 2023-05-22 NOTE — Telephone Encounter (Signed)
Called and LVM for patient regarding 3 cancellation/no show policy given no show to today's appointment. Gave update regarding on how to get back on the schedule.

## 2023-06-17 ENCOUNTER — Other Ambulatory Visit: Payer: Self-pay | Admitting: Physician Assistant

## 2023-06-18 NOTE — Telephone Encounter (Signed)
?   If she is taking. 3/20 note says to continue.

## 2023-06-19 NOTE — Telephone Encounter (Signed)
LVM to RC 

## 2023-06-20 NOTE — Telephone Encounter (Signed)
Left second VM

## 2023-06-22 NOTE — Telephone Encounter (Signed)
Was able to reach patient and she said she just got a RF and does not need another at this time.

## 2023-06-23 ENCOUNTER — Other Ambulatory Visit: Payer: Self-pay | Admitting: Internal Medicine

## 2023-06-23 NOTE — Telephone Encounter (Signed)
Trazodone is not on pt's current med list.

## 2023-06-23 NOTE — Telephone Encounter (Signed)
Trazodone discontinued in June with recurrent falls. Please have patient follow-up in person.

## 2023-06-27 ENCOUNTER — Ambulatory Visit: Payer: Medicare HMO | Attending: Physical Therapy | Admitting: Physical Therapy

## 2023-07-06 ENCOUNTER — Other Ambulatory Visit: Payer: Self-pay | Admitting: Podiatry

## 2023-07-13 ENCOUNTER — Other Ambulatory Visit: Payer: Self-pay

## 2023-07-13 ENCOUNTER — Inpatient Hospital Stay (HOSPITAL_COMMUNITY)
Admission: EM | Admit: 2023-07-13 | Discharge: 2023-07-17 | DRG: 872 | Disposition: A | Discharge status: Home Health | Payer: Medicare HMO | Attending: Student | Admitting: Student

## 2023-07-13 ENCOUNTER — Emergency Department (HOSPITAL_COMMUNITY): Payer: Medicare HMO

## 2023-07-13 ENCOUNTER — Encounter (HOSPITAL_COMMUNITY): Payer: Self-pay

## 2023-07-13 DIAGNOSIS — Z6832 Body mass index (BMI) 32.0-32.9, adult: Secondary | ICD-10-CM | POA: Diagnosis not present

## 2023-07-13 DIAGNOSIS — Z811 Family history of alcohol abuse and dependence: Secondary | ICD-10-CM | POA: Diagnosis not present

## 2023-07-13 DIAGNOSIS — K219 Gastro-esophageal reflux disease without esophagitis: Secondary | ICD-10-CM | POA: Diagnosis present

## 2023-07-13 DIAGNOSIS — R55 Syncope and collapse: Secondary | ICD-10-CM | POA: Diagnosis not present

## 2023-07-13 DIAGNOSIS — G4730 Sleep apnea, unspecified: Secondary | ICD-10-CM | POA: Diagnosis present

## 2023-07-13 DIAGNOSIS — Z90710 Acquired absence of both cervix and uterus: Secondary | ICD-10-CM

## 2023-07-13 DIAGNOSIS — Y93E1 Activity, personal bathing and showering: Secondary | ICD-10-CM | POA: Diagnosis not present

## 2023-07-13 DIAGNOSIS — J4489 Other specified chronic obstructive pulmonary disease: Secondary | ICD-10-CM | POA: Diagnosis present

## 2023-07-13 DIAGNOSIS — N390 Urinary tract infection, site not specified: Secondary | ICD-10-CM | POA: Diagnosis present

## 2023-07-13 DIAGNOSIS — E669 Obesity, unspecified: Secondary | ICD-10-CM | POA: Diagnosis present

## 2023-07-13 DIAGNOSIS — N393 Stress incontinence (female) (male): Secondary | ICD-10-CM | POA: Diagnosis present

## 2023-07-13 DIAGNOSIS — Z888 Allergy status to other drugs, medicaments and biological substances status: Secondary | ICD-10-CM

## 2023-07-13 DIAGNOSIS — Z7985 Long-term (current) use of injectable non-insulin antidiabetic drugs: Secondary | ICD-10-CM | POA: Diagnosis not present

## 2023-07-13 DIAGNOSIS — R748 Abnormal levels of other serum enzymes: Secondary | ICD-10-CM | POA: Insufficient documentation

## 2023-07-13 DIAGNOSIS — Z825 Family history of asthma and other chronic lower respiratory diseases: Secondary | ICD-10-CM

## 2023-07-13 DIAGNOSIS — Y92012 Bathroom of single-family (private) house as the place of occurrence of the external cause: Secondary | ICD-10-CM

## 2023-07-13 DIAGNOSIS — F411 Generalized anxiety disorder: Secondary | ICD-10-CM | POA: Diagnosis present

## 2023-07-13 DIAGNOSIS — E785 Hyperlipidemia, unspecified: Secondary | ICD-10-CM | POA: Diagnosis present

## 2023-07-13 DIAGNOSIS — E8809 Other disorders of plasma-protein metabolism, not elsewhere classified: Secondary | ICD-10-CM | POA: Diagnosis present

## 2023-07-13 DIAGNOSIS — I10 Essential (primary) hypertension: Secondary | ICD-10-CM | POA: Diagnosis present

## 2023-07-13 DIAGNOSIS — M6282 Rhabdomyolysis: Principal | ICD-10-CM

## 2023-07-13 DIAGNOSIS — R3989 Other symptoms and signs involving the genitourinary system: Secondary | ICD-10-CM | POA: Diagnosis not present

## 2023-07-13 DIAGNOSIS — A4150 Gram-negative sepsis, unspecified: Secondary | ICD-10-CM | POA: Insufficient documentation

## 2023-07-13 DIAGNOSIS — E876 Hypokalemia: Secondary | ICD-10-CM | POA: Diagnosis present

## 2023-07-13 DIAGNOSIS — A4151 Sepsis due to Escherichia coli [E. coli]: Principal | ICD-10-CM | POA: Diagnosis present

## 2023-07-13 DIAGNOSIS — T796XXA Traumatic ischemia of muscle, initial encounter: Secondary | ICD-10-CM | POA: Insufficient documentation

## 2023-07-13 DIAGNOSIS — D72829 Elevated white blood cell count, unspecified: Secondary | ICD-10-CM | POA: Diagnosis not present

## 2023-07-13 DIAGNOSIS — Z79899 Other long term (current) drug therapy: Secondary | ICD-10-CM

## 2023-07-13 DIAGNOSIS — F419 Anxiety disorder, unspecified: Secondary | ICD-10-CM | POA: Diagnosis not present

## 2023-07-13 DIAGNOSIS — F32A Depression, unspecified: Secondary | ICD-10-CM | POA: Diagnosis present

## 2023-07-13 DIAGNOSIS — A419 Sepsis, unspecified organism: Secondary | ICD-10-CM | POA: Diagnosis not present

## 2023-07-13 DIAGNOSIS — Z886 Allergy status to analgesic agent status: Secondary | ICD-10-CM

## 2023-07-13 DIAGNOSIS — Z87440 Personal history of urinary (tract) infections: Secondary | ICD-10-CM

## 2023-07-13 DIAGNOSIS — S51811A Laceration without foreign body of right forearm, initial encounter: Secondary | ICD-10-CM | POA: Diagnosis present

## 2023-07-13 DIAGNOSIS — W16212A Fall in (into) filled bathtub causing other injury, initial encounter: Secondary | ICD-10-CM | POA: Diagnosis present

## 2023-07-13 DIAGNOSIS — Z87891 Personal history of nicotine dependence: Secondary | ICD-10-CM

## 2023-07-13 DIAGNOSIS — Z803 Family history of malignant neoplasm of breast: Secondary | ICD-10-CM | POA: Diagnosis not present

## 2023-07-13 LAB — COMPREHENSIVE METABOLIC PANEL
ALT: 25 U/L (ref 0–44)
AST: 114 U/L — ABNORMAL HIGH (ref 15–41)
Albumin: 3.9 g/dL (ref 3.5–5.0)
Alkaline Phosphatase: 99 U/L (ref 38–126)
Anion gap: 13 (ref 5–15)
BUN: 13 mg/dL (ref 8–23)
CO2: 23 mmol/L (ref 22–32)
Calcium: 9.7 mg/dL (ref 8.9–10.3)
Chloride: 103 mmol/L (ref 98–111)
Creatinine, Ser: 0.55 mg/dL (ref 0.44–1.00)
GFR, Estimated: >60 mL/min (ref >60)
Glucose, Bld: 110 mg/dL — ABNORMAL HIGH (ref 70–99)
Potassium: 4 mmol/L (ref 3.5–5.1)
Sodium: 139 mmol/L (ref 135–145)
Total Bilirubin: 2.7 mg/dL — ABNORMAL HIGH (ref 0.3–1.2)
Total Protein: 7.3 g/dL (ref 6.5–8.1)

## 2023-07-13 LAB — TROPONIN I (HIGH SENSITIVITY)
Troponin I (High Sensitivity): 12 ng/L (ref <18)
Troponin I (High Sensitivity): 14 ng/L (ref <18)

## 2023-07-13 LAB — CBC
HCT: 50.6 % — ABNORMAL HIGH (ref 36.0–46.0)
Hemoglobin: 17.4 g/dL — ABNORMAL HIGH (ref 12.0–15.0)
MCH: 31 pg (ref 26.0–34.0)
MCHC: 34.4 g/dL (ref 30.0–36.0)
MCV: 90 fL (ref 80.0–100.0)
Platelets: 151 10*3/uL (ref 150–400)
RBC: 5.62 MIL/uL — ABNORMAL HIGH (ref 3.87–5.11)
RDW: 13.6 % (ref 11.5–15.5)
WBC: 15.5 10*3/uL — ABNORMAL HIGH (ref 4.0–10.5)
nRBC: 0 % (ref 0.0–0.2)

## 2023-07-13 LAB — URINALYSIS, W/ REFLEX TO CULTURE (INFECTION SUSPECTED)
Bilirubin Urine: NEGATIVE
Glucose, UA: NEGATIVE mg/dL
Ketones, ur: 5 mg/dL — AB
Nitrite: POSITIVE — AB
Protein, ur: 30 mg/dL — AB
Specific Gravity, Urine: 1.025 (ref 1.005–1.030)
WBC, UA: >50 WBC/hpf (ref 0–5)
pH: 5 (ref 5.0–8.0)

## 2023-07-13 LAB — CBG MONITORING, ED: Glucose-Capillary: 122 mg/dL — ABNORMAL HIGH (ref 70–99)

## 2023-07-13 LAB — CK: Total CK: 4631 U/L — ABNORMAL HIGH (ref 38–234)

## 2023-07-13 LAB — LIPASE, BLOOD: Lipase: 23 U/L (ref 11–51)

## 2023-07-13 MED ORDER — SODIUM CHLORIDE 0.9 % IV SOLN: INTRAVENOUS | Status: DC

## 2023-07-13 MED ORDER — ACETAMINOPHEN 325 MG PO TABS
650.0000 mg | ORAL_TABLET | Freq: Four times a day (QID) | ORAL | Status: DC | PRN
  Administered 2023-07-14: 650 mg via ORAL
  Filled 2023-07-13: qty 2

## 2023-07-13 MED ORDER — ENOXAPARIN SODIUM 40 MG/0.4ML IJ SOSY
40.0000 mg | PREFILLED_SYRINGE | INTRAMUSCULAR | Status: DC
  Administered 2023-07-14 – 2023-07-17 (×4): 40 mg via SUBCUTANEOUS
  Filled 2023-07-13 (×4): qty 0.4

## 2023-07-13 MED ORDER — PROCHLORPERAZINE EDISYLATE 10 MG/2ML IJ SOLN: 5.0000 mg | Freq: Four times a day (QID) | INTRAMUSCULAR | Status: DC | PRN

## 2023-07-13 MED ORDER — SODIUM CHLORIDE 0.9 % IV BOLUS
1000.0000 mL | Freq: Once | INTRAVENOUS | Status: AC
  Administered 2023-07-13: 1000 mL via INTRAVENOUS

## 2023-07-13 MED ORDER — SODIUM CHLORIDE 0.9 % IV SOLN
2.0000 g | INTRAVENOUS | Status: DC
  Administered 2023-07-14 – 2023-07-17 (×4): 2 g via INTRAVENOUS
  Filled 2023-07-13 (×4): qty 20

## 2023-07-13 MED ORDER — MELATONIN 5 MG PO TABS
5.0000 mg | ORAL_TABLET | Freq: Every evening | ORAL | Status: DC | PRN
  Administered 2023-07-14 – 2023-07-15 (×2): 5 mg via ORAL
  Filled 2023-07-13 (×2): qty 1

## 2023-07-13 MED ORDER — SODIUM CHLORIDE 0.9 % IV SOLN
1.0000 g | Freq: Once | INTRAVENOUS | Status: AC
  Administered 2023-07-13: 1 g via INTRAVENOUS
  Filled 2023-07-13: qty 10

## 2023-07-13 MED ORDER — POLYETHYLENE GLYCOL 3350 17 G PO PACK: 17.0000 g | PACK | Freq: Every day | ORAL | Status: DC | PRN

## 2023-07-13 NOTE — H&P (Addendum)
History and Physical  [name] ZOX:[id number] DOB: [birth year] DOA: 07/13/2023  Referring physician: Dr. Maple Hudson, EDP  PCP: Rudene Christians, DO  Outpatient Specialists: Behavioral health. Patient coming from: Home.  Chief Complaint: Passed out.  HPI: [name] [name] is a 67 y.o. female with medical history significant for chronic anxiety on as needed Xanax, chronic depression, hypertension, hyperlipidemia, GERD, obesity on weekly Ozempic for weight loss, who presents to the ED via EMS after passing out in her bathroom.  She took her Xanax prior to getting into the bathtub.  Denies alcohol use.  States she got into her bathtub she slipped fell lost consciousness and realized that she was in her bathtub hours later after her fall.  She was at home alone.  States her husband is at a nursing home.  She yelled for help until she was heard by her landlord who called for EMS.  In the ED, CT head was nonacute.  UA was positive for pyuria.  CPK was greater than 4600.  IV antibiotics Rocephin and IV fluid were initiated in the ED.  TRH, hospitalist service was asked to admit.  ED Course: Temperature 98.7.  BP 144/100, pulse 84, respiratory 26, saturation 91% on room air.  Lab studies notable for glucose 110, AST 114, ALT 25, T. bili 2.7, CPK 4631.  Review of Systems: Review of systems as noted in the HPI. All other systems reviewed and are negative.   Past Medical History:  Diagnosis Date   Anxiety    Asthma    Chronic, with restrictive component 2/2 obesity   COPD (chronic obstructive pulmonary disease) (HCC)    Depression    GERD (gastroesophageal reflux disease)    Headache    Insomnia    Obesity    On home oxygen therapy    "3L at night only when I get sick" (04/30/2014)   Shortness of breath dyspnea    Sleep apnea    "couldn't afford mask so I didn't get it" (04/30/2014)   Past Surgical History:  Procedure Laterality Date   APPENDECTOMY  ~ 2002   VAGINAL HYSTERECTOMY   ~ 1978    Social History:  reports that she quit smoking about 54 years ago. Her smoking use included cigarettes. She has never used smokeless tobacco. She reports that she does not drink alcohol and does not use drugs.   Allergies  Allergen Reactions   Ibuprofen Anaphylaxis, Hives and Swelling    Throat swells   Clonazepam Other (See Comments)    Dizziness     Family History  Problem Relation Age of Onset   Breast cancer Mother    Alcohol abuse Mother    Emphysema Mother        smoked   Asthma Mother    Breast cancer Maternal Aunt    Breast cancer Maternal Grandmother       Prior to Admission medications   Medication Sig Start Date End Date Taking? Authorizing Provider  acetaminophen (TYLENOL) 500 MG tablet Take 1,000 mg by mouth every 6 (six) hours as needed (for pain OR back pain).    Yes [provider]  albuterol (VENTOLIN HFA) 108 (90 Base) MCG/ACT inhaler Inhale 2 puffs into the lungs every 6 (six) hours as needed for wheezing or shortness of breath. 06/16/20  Yes Remo Lipps, MD  ALPRAZolam Prudy Feeler) 1 MG tablet TAKE 1/2 TO 1 TABLET(0.5 TO 1 MG) BY MOUTH THREE TIMES DAILY AS NEEDED FOR ANXIETY 05/18/23  Yes Cherie Ouch,  PA-C  atorvastatin (LIPITOR) 10 MG tablet TAKE 1 TABLET(10 MG) BY MOUTH DAILY 04/12/23  Yes [name], [name], DO  busPIRone (BUSPAR) 5 MG tablet Take 1 tablet (5 mg total) by mouth 3 (three) times daily. 04/27/23  Yes [name], [name], DO  gabapentin (NEURONTIN) 100 MG capsule TAKE 1 CAPSULE(100 MG) BY MOUTH THREE TIMES DAILY 03/14/22  Yes Vivi Barrack, DPM  OZEMPIC, 1 MG/DOSE, 4 MG/3ML SOPN Inject 1 mg into the skin once a week. 05/24/23  Yes [provider]  pantoprazole (PROTONIX) 40 MG tablet TAKE 1 TABLET(40 MG) BY MOUTH DAILY 04/12/23  Yes [name], [name], DO  sertraline (ZOLOFT) 100 MG tablet TAKE 3 TABLETS(300 MG) BY MOUTH DAILY 03/12/23  Yes Hurst, Teresa T, PA-C  traZODone (DESYREL) 150 MG tablet Take 150 mg by mouth at bedtime.  06/13/23  Yes [provider]    Physical Exam: BP (!) 144/100   Pulse 84   Temp 98.7 F (37.1 C) (Oral)   Resp 19   SpO2 95%   General: 67 y.o. year-old female well developed well nourished in no acute distress.  Alert and oriented x3. Cardiovascular: Regular rate and rhythm with no rubs or gallops.  No thyromegaly or JVD noted.  No lower extremity edema. 2/4 pulses in all 4 extremities. Respiratory: Clear to auscultation with no wheezes or rales. Good inspiratory effort. Abdomen: Soft nontender nondistended with normal bowel sounds x4 quadrants. Muskuloskeletal: No cyanosis, clubbing or edema noted bilaterally Neuro: CN II-XII intact, strength, sensation, reflexes Skin: No ulcerative lesions noted or rashes Psychiatry: Judgement and insight appear normal. Mood is appropriate for condition and setting          Labs on Admission:  Basic Metabolic Panel: Recent Labs  Lab 07/13/23 2003  NA 139  K 4.0  CL 103  CO2 23  GLUCOSE 110*  BUN 13  CREATININE 0.55  CALCIUM 9.7   Liver Function Tests: Recent Labs  Lab 07/13/23 2003  AST 114*  ALT 25  ALKPHOS 99  BILITOT 2.7*  PROT 7.3  ALBUMIN 3.9   Recent Labs  Lab 07/13/23 2003  LIPASE 23   No results for input(s): "AMMONIA" in the last 168 hours. CBC: Recent Labs  Lab 07/13/23 2003  WBC 15.5*  HGB 17.4*  HCT 50.6*  MCV 90.0  PLT 151   Cardiac Enzymes: Recent Labs  Lab 07/13/23 2003  CKTOTAL 4,631*    BNP (last 3 results) Recent Labs    02/07/23 0247  BNP 277.8*    ProBNP (last 3 results) No results for input(s): "PROBNP" in the last 8760 hours.  CBG: Recent Labs  Lab 07/13/23 1840  GLUCAP 122*    Radiological Exams on Admission: CT Head Wo Contrast  Result Date: 07/13/2023 CLINICAL DATA:  Brought in due to fall in bathtub, family believe patient bathtub for 24 hours. Patient has no memory of the event. Patient not oriented to time EXAM: CT HEAD WITHOUT CONTRAST TECHNIQUE:  Contiguous axial images were obtained from the base of the skull through the vertex without intravenous contrast. RADIATION DOSE REDUCTION: This exam was performed according to the departmental dose-optimization program which includes automated exposure control, adjustment of the mA and/or kV according to patient size and/or use of iterative reconstruction technique. COMPARISON:  CT head 04/21/2023 FINDINGS: Brain: No intracranial hemorrhage, mass effect, or evidence of acute infarct. No hydrocephalus. No extra-axial fluid collection. Vascular: No hyperdense vessel or unexpected calcification. Skull: No fracture or focal lesion. Sinuses/Orbits: No acute finding. Other:  None. IMPRESSION: No acute intracranial abnormality. Electronically Signed   By: Minerva Fester M.D.   On: 07/13/2023 20:57   DG Chest 2 View  Result Date: 07/13/2023 CLINICAL DATA:  Fall in bathtub. Family believe patient was in bathroom for 24 hours. No memory of the event. Abrasion to right arm. EXAM: CHEST - 2 VIEW COMPARISON:  Radiograph 04/20/2023 FINDINGS: Stable cardiomediastinal silhouette. Aortic atherosclerotic calcification. Hyperinflation and chronic bronchitic change. No focal consolidation, pleural effusion, or pneumothorax. No displaced rib fractures. IMPRESSION: No acute cardiopulmonary disease. Electronically Signed   By: Minerva Fester M.D.   On: 07/13/2023 20:33    EKG: I independently viewed the EKG done and my findings are as followed: Sinus rhythm rate of 97.  Nonspecific ST-T changes.  QTc 498.  RBBB.  Assessment/Plan Present on Admission:  Syncope  Principal Problem:   Syncope  Syncope, unclear etiology Possibly contributed by polypharmacy Hold off gabapentin and trazodone The patient is also on Xanax as needed, restart at lowest dose, 0.25 mg twice daily as needed for anxiety Obtain orthostatic vital signs 2D echo Telemetry monitoring PT OT assessment with fall precautions  Sepsis secondary to  presumptive UTI, POA Leukocytosis 15.5, tachypnea 26 UA positive for pyuria Follow urine culture and peripheral blood cultures x 2 Continue Rocephin empirically, started in the ED Monitor fever curve and WBCs Maintain MAP greater than 65  Rhabdomyolysis post fall Initial CPK greater than 4600 Continue IV fluid hydration Trend CPK  Elevated T. bili, nonspecific Initial T. bili 2.7 Monitor for now Avoid hepatotoxic agents  Elevated liver chemistry, AST to ALT ratio greater than 2:1 AST 114, ALT 25 Denies use of alcohol Follow UDS  Chronic anxiety/depression Resume home as needed Xanax for anxiety at lowest dose Resume home BuSpar and Zoloft  Hyperlipidemia Resume home Lipitor  GERD Resume home PPI  Obesity Weight 100 kg Recommend weight loss outpatient with regular physical activity and healthy dieting.   Time: 75 minutes.   DVT prophylaxis: Subcu Lovenox daily.  Code Status: Full code.  Family Communication: None at bedside.  Disposition Plan: Admitted to telemetry unit.  Consults called: None.  Admission status: Inpatient status.   Status is: Inpatient The patient requires at least 2 midnights for further evaluation and treatment of present condition.   Darlin Drop MD [facility] Pager [phone]  If 7PM-7AM, please contact night-coverage [email or web address] Password [facility]  07/14/2023, 3:47 AM

## 2023-07-13 NOTE — ED Notes (Signed)
Skin tear to right thumb cleansed with saline, dried and band aid applied.

## 2023-07-13 NOTE — ED Notes (Signed)
Pt's son Trey Paula updated on patient condition and plan for admission with verbal consent from the patient to do so. Jeff's phone number is [phone].

## 2023-07-13 NOTE — ED Triage Notes (Signed)
EMS reports pt from home. Pt brought in due to fall in bath tub. Family believe pt was a bath tub for 24 hours. Pt has no memory of event. Pt landlord found pt in bath tub screaming. Pt is not oriented to time. Ammonia odor noted by EMS in bedroom. Hx of UTI, kidney infection. No head injury noted. Pt states neck is sore. Minor abrasion to right arm. No blood thinners. Pt took xanax before ED arrival.  BP 124/86 HR 108 Sp02 88 RA CBG 139 T 99.8

## 2023-07-13 NOTE — ED Provider Notes (Signed)
[place] EMERGENCY DEPARTMENT AT [facility] Provider Note   CSN: [id number] Arrival date & time: 07/13/23  4098     History  Chief Complaint  Patient presents with   Fall   Altered Mental Status    [name] [name] is a 67 y.o. female.  Presenting emergency department after being found in the bathtub with no water in and for possibly 24 hours.  Patient states that she went to take a shower last night awoke today in the bathtub.  Does not know what happened.  Unsure if she hit her head or passed out.  She denies headache, vision changes.  Has had some intermittent chest pain.  History of COPD not on oxygen.  No shortness of breath.  Not having abdominal pain.  States she is mostly feels like her normal self currently.   Fall  Altered Mental Status      Home Medications Prior to Admission medications   Medication Sig Start Date End Date Taking? Authorizing Provider  acetaminophen (TYLENOL) 500 MG tablet Take 1,000 mg by mouth every 6 (six) hours as needed (for pain OR back pain).    Yes [provider]  albuterol (VENTOLIN HFA) 108 (90 Base) MCG/ACT inhaler Inhale 2 puffs into the lungs every 6 (six) hours as needed for wheezing or shortness of breath. 06/16/20  Yes Remo Lipps, MD  ALPRAZolam Prudy Feeler) 1 MG tablet TAKE 1/2 TO 1 TABLET(0.5 TO 1 MG) BY MOUTH THREE TIMES DAILY AS NEEDED FOR ANXIETY 05/18/23  Yes Hurst, Teresa T, PA-C  atorvastatin (LIPITOR) 10 MG tablet TAKE 1 TABLET(10 MG) BY MOUTH DAILY 04/12/23  Yes [name], [name], DO  busPIRone (BUSPAR) 5 MG tablet Take 1 tablet (5 mg total) by mouth 3 (three) times daily. 04/27/23  Yes [name], [name], DO  gabapentin (NEURONTIN) 100 MG capsule TAKE 1 CAPSULE(100 MG) BY MOUTH THREE TIMES DAILY 03/14/22  Yes Vivi Barrack, DPM  OZEMPIC, 1 MG/DOSE, 4 MG/3ML SOPN Inject 1 mg into the skin once a week. 05/24/23  Yes [provider]  pantoprazole (PROTONIX) 40 MG tablet TAKE 1 TABLET(40 MG) BY MOUTH  DAILY 04/12/23  Yes [name], [name], DO  sertraline (ZOLOFT) 100 MG tablet TAKE 3 TABLETS(300 MG) BY MOUTH DAILY 03/12/23  Yes Hurst, Teresa T, PA-C  traZODone (DESYREL) 150 MG tablet Take 150 mg by mouth at bedtime. 06/13/23  Yes [provider]      Allergies    Ibuprofen and Clonazepam    Review of Systems   Review of Systems  Physical Exam Updated Vital Signs BP 137/87   Pulse 92   Temp 98.2 F (36.8 C) (Oral)   Resp (!) 23   SpO2 93%  Physical Exam Vitals and nursing note reviewed.  HENT:     Head: Normocephalic and atraumatic.     Nose: Nose normal.     Mouth/Throat:     Mouth: Mucous membranes are moist.  Eyes:     Conjunctiva/sclera: Conjunctivae normal.  Cardiovascular:     Rate and Rhythm: Normal rate and regular rhythm.  Pulmonary:     Effort: Pulmonary effort is normal.  Abdominal:     General: Abdomen is flat. There is no distension.     Tenderness: There is no abdominal tenderness. There is no guarding or rebound.  Musculoskeletal:     Comments: Small skin tear to lateral right forearm into right thumb.  Neurovascular intact in all extremities.  Normal strength.  Normal sensation.  Chest stable  nontender.  Pelvis stable nontender.  No bony tenderness to extremities.  Has equal strength and sensation bilateral lower extremities.  Skin:    General: Skin is warm and dry.     Capillary Refill: Capillary refill takes less than 2 seconds.  Neurological:     General: No focal deficit present.     Mental Status: She is alert and oriented to person, place, and time.  Psychiatric:        Mood and Affect: Mood normal.        Behavior: Behavior normal.     ED Results / Procedures / Treatments   Labs (all labs ordered are listed, but only abnormal results are displayed) Labs Reviewed  CBC - Abnormal; Notable for the following components:      Result Value   WBC 15.5 (*)    RBC 5.62 (*)    Hemoglobin 17.4 (*)    HCT 50.6 (*)    All other components within  normal limits  COMPREHENSIVE METABOLIC PANEL - Abnormal; Notable for the following components:   Glucose, Bld 110 (*)    AST 114 (*)    Total Bilirubin 2.7 (*)    All other components within normal limits  CK - Abnormal; Notable for the following components:   Total CK 4,631 (*)    All other components within normal limits  URINALYSIS, W/ REFLEX TO CULTURE (INFECTION SUSPECTED) - Abnormal; Notable for the following components:   Color, Urine RED (*)    APPearance TURBID (*)    Hgb urine dipstick SMALL (*)    Ketones, ur 5 (*)    Protein, ur 30 (*)    Nitrite POSITIVE (*)    Leukocytes,Ua LARGE (*)    Bacteria, UA MANY (*)    All other components within normal limits  CBG MONITORING, ED - Abnormal; Notable for the following components:   Glucose-Capillary 122 (*)    All other components within normal limits  URINE CULTURE  LIPASE, BLOOD  CK  CBG MONITORING, ED  TROPONIN I (HIGH SENSITIVITY)  TROPONIN I (HIGH SENSITIVITY)    EKG None  Radiology CT Head Wo Contrast  Result Date: 07/13/2023 CLINICAL DATA:  Brought in due to fall in bathtub, family believe patient bathtub for 24 hours. Patient has no memory of the event. Patient not oriented to time EXAM: CT HEAD WITHOUT CONTRAST TECHNIQUE: Contiguous axial images were obtained from the base of the skull through the vertex without intravenous contrast. RADIATION DOSE REDUCTION: This exam was performed according to the departmental dose-optimization program which includes automated exposure control, adjustment of the mA and/or kV according to patient size and/or use of iterative reconstruction technique. COMPARISON:  CT head 04/21/2023 FINDINGS: Brain: No intracranial hemorrhage, mass effect, or evidence of acute infarct. No hydrocephalus. No extra-axial fluid collection. Vascular: No hyperdense vessel or unexpected calcification. Skull: No fracture or focal lesion. Sinuses/Orbits: No acute finding. Other: None. IMPRESSION: No acute  intracranial abnormality. Electronically Signed   By: Minerva Fester M.D.   On: 07/13/2023 20:57   DG Chest 2 View  Result Date: 07/13/2023 CLINICAL DATA:  Fall in bathtub. Family believe patient was in bathroom for 24 hours. No memory of the event. Abrasion to right arm. EXAM: CHEST - 2 VIEW COMPARISON:  Radiograph 04/20/2023 FINDINGS: Stable cardiomediastinal silhouette. Aortic atherosclerotic calcification. Hyperinflation and chronic bronchitic change. No focal consolidation, pleural effusion, or pneumothorax. No displaced rib fractures. IMPRESSION: No acute cardiopulmonary disease. Electronically Signed   By: Angelique Holm.D.  On: 07/13/2023 20:33    Procedures Procedures    Medications Ordered in ED Medications  enoxaparin (LOVENOX) injection 40 mg (has no administration in time range)  acetaminophen (TYLENOL) tablet 650 mg (has no administration in time range)  prochlorperazine (COMPAZINE) injection 5 mg (has no administration in time range)  melatonin tablet 5 mg (has no administration in time range)  polyethylene glycol (MIRALAX / GLYCOLAX) packet 17 g (has no administration in time range)  0.9 %  sodium chloride infusion (has no administration in time range)  cefTRIAXone (ROCEPHIN) 2 g in sodium chloride 0.9 % 100 mL IVPB (has no administration in time range)  sodium chloride 0.9 % bolus 1,000 mL (1,000 mLs Intravenous New Bag/Given 07/13/23 2129)  cefTRIAXone (ROCEPHIN) 1 g in sodium chloride 0.9 % 100 mL IVPB (1 g Intravenous New Bag/Given 07/13/23 2237)    ED Course/ Medical Decision Making/ A&P Clinical Course as of 07/13/23 2353  Thu Jul 13, 2023  2043 Echo 2022: 1. Left ventricular ejection fraction, by estimation, is 55 to 60%. The  left ventricle has normal function. The left ventricle has no regional wall motion abnormalities.   [TY]  2056 DG Chest 2 View IMPRESSION: No acute cardiopulmonary disease.   [TY]  2056 CBC(!) Leukocytosis and polycythemia noted. [TY]   2111 CT Head Wo Contrast IMPRESSION: No acute intracranial abnormality.   [TY]  2119 CK Total(!): 4,631 Will start IVFs [TY]    Clinical Course User Index [TY] Coral Spikes, DO                                 Medical Decision Making 67 year old female present emergency department after syncopal episode or seizure or fall?  Unclear circumstances.  Afebrile vital signs reassuring.  No localizing deficits on exam.  Has some skin tears to her right forearm and thumb, otherwise no traumatic injuries.  CT head negative.  Labs showed elevated CK, but no renal injury.  Normal electrolytes.  EKG without arrhythmia or ST segment changes indicate ischemia.  Troponin 14; not indicated of ACS.  No transaminitis to suggest hepatobiliary disease.  Lipase normal.  Pancreatitis unlikely.  Does have mild leukocytosis and what appears to be UTI.  Given Rocephin.  Received IV fluids for elevated CK.  Given unclear circumstances regarding patient's presentation today will admit for observation for syncopal workup.  Amount and/or Complexity of Data Reviewed External Data Reviewed: notes.    Details: Last echo: 1. Left ventricular ejection fraction, by estimation, is 55 to 60%. The  left ventricle has normal function. The left ventricle has no regional  wall motion abnormalities.   Labs: ordered. Decision-making details documented in ED Course. Radiology: ordered. Decision-making details documented in ED Course. ECG/medicine tests: ordered.  Risk Decision regarding hospitalization.          Final Clinical Impression(s) / ED Diagnoses Final diagnoses:  Non-traumatic rhabdomyolysis  Syncope and collapse    Rx / DC Orders ED Discharge Orders     None         Coral Spikes, DO 07/13/23 2353

## 2023-07-14 ENCOUNTER — Inpatient Hospital Stay (HOSPITAL_COMMUNITY): Payer: Medicare HMO

## 2023-07-14 ENCOUNTER — Other Ambulatory Visit: Payer: Self-pay

## 2023-07-14 DIAGNOSIS — R55 Syncope and collapse: Secondary | ICD-10-CM | POA: Diagnosis not present

## 2023-07-14 DIAGNOSIS — E876 Hypokalemia: Secondary | ICD-10-CM | POA: Diagnosis not present

## 2023-07-14 DIAGNOSIS — R3989 Other symptoms and signs involving the genitourinary system: Secondary | ICD-10-CM | POA: Diagnosis not present

## 2023-07-14 DIAGNOSIS — M6282 Rhabdomyolysis: Secondary | ICD-10-CM

## 2023-07-14 LAB — ECHOCARDIOGRAM COMPLETE
Area-P 1/2: 2.48 cm2
Calc EF: 64.7 %
Height: 68 in
MV M vel: 2.37 m/s
MV Peak grad: 22.5 mmHg
MV VTI: 3.46 cm2
S' Lateral: 4.3 cm
Single Plane A2C EF: 62.2 %
Single Plane A4C EF: 64.6 %
Weight: 3446.23 [oz_av]

## 2023-07-14 LAB — COMPREHENSIVE METABOLIC PANEL
ALT: 26 U/L (ref 0–44)
AST: 126 U/L — ABNORMAL HIGH (ref 15–41)
Albumin: 3.3 g/dL — ABNORMAL LOW (ref 3.5–5.0)
Alkaline Phosphatase: 84 U/L (ref 38–126)
Anion gap: 11 (ref 5–15)
BUN: 14 mg/dL (ref 8–23)
CO2: 23 mmol/L (ref 22–32)
Calcium: 8.6 mg/dL — ABNORMAL LOW (ref 8.9–10.3)
Chloride: 102 mmol/L (ref 98–111)
Creatinine, Ser: 0.54 mg/dL (ref 0.44–1.00)
GFR, Estimated: >60 mL/min (ref >60)
Glucose, Bld: 105 mg/dL — ABNORMAL HIGH (ref 70–99)
Potassium: 3.3 mmol/L — ABNORMAL LOW (ref 3.5–5.1)
Sodium: 136 mmol/L (ref 135–145)
Total Bilirubin: 2 mg/dL — ABNORMAL HIGH (ref 0.3–1.2)
Total Protein: 6 g/dL — ABNORMAL LOW (ref 6.5–8.1)

## 2023-07-14 LAB — CBC
HCT: 46.4 % — ABNORMAL HIGH (ref 36.0–46.0)
Hemoglobin: 15.6 g/dL — ABNORMAL HIGH (ref 12.0–15.0)
MCH: 31 pg (ref 26.0–34.0)
MCHC: 33.6 g/dL (ref 30.0–36.0)
MCV: 92.1 fL (ref 80.0–100.0)
Platelets: 121 10*3/uL — ABNORMAL LOW (ref 150–400)
RBC: 5.04 MIL/uL (ref 3.87–5.11)
RDW: 13.8 % (ref 11.5–15.5)
WBC: 10.4 10*3/uL (ref 4.0–10.5)
nRBC: 0 % (ref 0.0–0.2)

## 2023-07-14 LAB — RAPID URINE DRUG SCREEN, HOSP PERFORMED
Amphetamines: NOT DETECTED
Barbiturates: NOT DETECTED
Benzodiazepines: POSITIVE — AB
Cocaine: NOT DETECTED
Opiates: NOT DETECTED
Tetrahydrocannabinol: NOT DETECTED

## 2023-07-14 LAB — ETHANOL: Alcohol, Ethyl (B): <10 mg/dL (ref <10)

## 2023-07-14 LAB — CK: Total CK: 4485 U/L — ABNORMAL HIGH (ref 38–234)

## 2023-07-14 LAB — PHOSPHORUS: Phosphorus: 3 mg/dL (ref 2.5–4.6)

## 2023-07-14 LAB — MAGNESIUM: Magnesium: 2.1 mg/dL (ref 1.7–2.4)

## 2023-07-14 MED ORDER — ALBUTEROL SULFATE (2.5 MG/3ML) 0.083% IN NEBU: 3.0000 mL | INHALATION_SOLUTION | Freq: Four times a day (QID) | RESPIRATORY_TRACT | Status: DC | PRN

## 2023-07-14 MED ORDER — ALPRAZOLAM 0.25 MG PO TABS
0.2500 mg | ORAL_TABLET | Freq: Two times a day (BID) | ORAL | Status: DC | PRN
  Administered 2023-07-14 – 2023-07-15 (×2): 0.25 mg via ORAL
  Filled 2023-07-14 (×2): qty 1

## 2023-07-14 MED ORDER — SERTRALINE HCL 100 MG PO TABS
100.0000 mg | ORAL_TABLET | Freq: Every day | ORAL | Status: DC
  Administered 2023-07-15 – 2023-07-16 (×2): 100 mg via ORAL
  Filled 2023-07-14 (×3): qty 1

## 2023-07-14 MED ORDER — ATORVASTATIN CALCIUM 10 MG PO TABS: 10.0000 mg | ORAL_TABLET | Freq: Every day | ORAL | Status: DC

## 2023-07-14 MED ORDER — ORAL CARE MOUTH RINSE: 15.0000 mL | OROMUCOSAL | Status: DC | PRN

## 2023-07-14 MED ORDER — BUSPIRONE HCL 5 MG PO TABS
5.0000 mg | ORAL_TABLET | Freq: Three times a day (TID) | ORAL | Status: DC
  Administered 2023-07-14 – 2023-07-16 (×7): 5 mg via ORAL
  Filled 2023-07-14 (×7): qty 1

## 2023-07-14 MED ORDER — POTASSIUM CHLORIDE CRYS ER 20 MEQ PO TBCR
40.0000 meq | EXTENDED_RELEASE_TABLET | Freq: Two times a day (BID) | ORAL | Status: AC
  Administered 2023-07-14 (×2): 40 meq via ORAL
  Filled 2023-07-14 (×2): qty 2

## 2023-07-14 MED ORDER — PANTOPRAZOLE SODIUM 40 MG PO TBEC
40.0000 mg | DELAYED_RELEASE_TABLET | Freq: Every day | ORAL | Status: DC
  Administered 2023-07-14 – 2023-07-17 (×4): 40 mg via ORAL
  Filled 2023-07-14 (×4): qty 1

## 2023-07-14 NOTE — Hospital Course (Addendum)
The patient is a 67 year old obese Caucasian female with a past medical history significant for but limited to chronic anxiety on alprazolam, chronic depression, hypertension, hyperlipidemia, GERD, obesity who is on Ozempic for weight loss as well as other comorbidities who presents to the ED via EMS after passing out in her bathroom.  She took her Xanax pill prior to getting into the bathtub and does not recall what happened and woke up in the bathtub about 12 hours later.  She slipped and fell and lost consciousness and realized that she was in the bathtub after hours and was sore.  She states that her husband is a nursing home and she called for help and she was heard by landlord who called EMS.  She is brought to the ED and underwent a head CT which was nonacute.  UA was positive for pyuria and CPK is greater than 4600.  She is initially on IV antibiotics with ceftriaxone and given IV fluid hydration.  Tried hospitalist team was asked to admit this patient for syncope of unclear etiology as well as sepsis likely secondary to suspected UTI and rhabdomyolysis.  Currently she is getting IV for hydration and PT OT recommending home health.  Patient threatened to leave AMA today given that she is extremely anxious but then calm down later and we provided her with some Atarax and increased her Xanax dosing back to her home dose.  Assessment and Plan:  Syncope, Unclear Etiology -Possibly contributed by polypharmacy -Hold off gabapentin and trazodone -The patient is also on Xanax as needed, restart at lowest dose, 0.25 mg twice daily as needed for anxiety and this was titrated up to 0.5 mg 3 times daily as needed -Obtain orthostatic vital signs and she was not orthostatic -Continue to monitor on telemetry -Echocardiogram done and showed an EF of 60 to 65% -Continue IV fluid hydration with normal saline at 125 mL/h for 2 days and now will resume for another day or so given her elevated CK still -Obtain PT  OT to further evaluate and treat and they are recommending home health   Sepsis secondary to suspected UTI , POA, -She met sepsis criteria as she had a leukocytosis and was tachypneic on admission -Urinalysis as below -Follow urine culture and peripheral blood cultures x 2; 2 out of the 4 cultures grew out staph species in 2/4 (one in each set), but WBC WNL  -Will continue IV ceftriaxone empirically that was started in the ED and defer holding vancomycin and repeat blood cultures x 2 -Will continue to monitor temperature and fever curve and WBCs -Maintain MAP greater than 65   Chronic Anxiety/Depression -Resume home as needed Xanax for anxiety at lowest dose of 0.25 mg p.o. twice daily as needed anxiety -Resume home buspirone 5 mg p.o. 3 times daily and sertraline 100 mg p.o. nightly -Patient became extremely anxious today and threatened to sign out AMA but then calmed down later and we will provide her some Vistaril 25 mg p.o. 3 times daily as needed   Hyperlipidemia -Continue to hold statin given her rhabdomyolysis and worsening CK   GERD/GI Prophylaxis -Resume home PPI with pantoprazole 40 mg p.o. daily  Rhabdomyolysis -CK Level Trend: 4631 -> 4485 and slightly went up to 4579 so we will continue IV fluids -Continue with IV fluid hydration and repeat CK in the morning -Currently getting normal saline at 125 mL/h for 2 days and will need to be renewed in the morning -Urinalysis done and showed a turbid  appearance with red color urine, small hemoglobin, 5 ketones, large leukocytes, positive nitrites, many bacteria, 6-10 RBCs per high-power field, greater than 50 WBCs with urine culture pending and blood cultures pending  Hypokalemia -Patient's K+ Level Trend: Recent Labs  Lab 07/13/23 2003 07/14/23 0429 07/15/23 0407  K 4.0 3.3* 4.1  -Replete with po Kcl 40 mEQ BID -Continue to Monitor and Replete as Necessary -Repeat CMP in the AM   Abnormal LFTs, slightly worsening -Mild  and AST is abnormal and ALT is normal -Likely in the setting of rhabdomyolysis given that she denies the use of alcohol -LFT Trend: Recent Labs  Lab 07/13/23 2003 07/14/23 0429 07/15/23 0407  AST 114* 126* 155*  ALT 25 26 35  -Will continue to monitor patient's hepatic function trend and if necessary we will obtain a right upper quadrant ultrasound and acute hepatitis panel if necessary if her AST is not improved further next few days  Hyperbilirubinemia  -Bilirubin Trend: Recent Labs  Lab 07/13/23 2003 07/14/23 0429 07/15/23 0407  BILITOT 2.7* 2.0* 0.9  -Continue to Monitor and Trend and Repeat CMP in the AM   Hypoalbuminemia -Patient's Albumin Trend: Recent Labs  Lab 07/13/23 2003 07/14/23 0429 07/15/23 0407  ALBUMIN 3.9 3.3* 3.3*  -Continue to Monitor and Trend and repeat CMP in the AM  Obesity -Complicates overall prognosis and care -Estimated body mass index is 32.75 kg/m as calculated from the following:   Height as of this encounter: 5\' 8"  (1.727 m).   Weight as of this encounter: 97.7 kg.  -Weight Loss and Dietary Counseling given

## 2023-07-14 NOTE — Evaluation (Signed)
Physical Therapy Evaluation Patient Details Name: [name] [name] MRN: [id number] DOB: 03/14/1956 Today's Date: 07/14/2023  History of Present Illness  [name] [name] is a 67 yr old female admitted to the hospital after she passed out in her shower and was down for several hours. She was found to have possible UTI and rhabdomyolysis.Ongoing workup for syncope - orthostatic BP 9/6 were negative, possibly polypharmacy per MD note. PMH: anxiety, depression, HTN, HLD  Clinical Impression  Pt admitted with above diagnosis. At baseline, pt is independent and denies falls in the last year other than this fall associated with syncope. Today, she was able to ambulate 68' with RW and CGA.  She reports not really hurting but sore all over. Pt requiring increased time and effort for transfers and ambulation.  Pt should progress well - will recommend HHPT, RW, and encouraged pt to arrange for some intermittent support at discharge as she is currently home alone.  Pt currently with functional limitations due to the deficits listed below (see PT Problem List). Pt will benefit from acute skilled PT to increase their independence and safety with mobility to allow discharge.           If plan is discharge home, recommend the following: A little help with walking and/or transfers;A little help with bathing/dressing/bathroom;Assistance with cooking/housework;Help with stairs or ramp for entrance   Can travel by private vehicle        Equipment Recommendations Rolling walker (2 wheels)  Recommendations for Other Services       Functional Status Assessment Patient has had a recent decline in their functional status and demonstrates the ability to make significant improvements in function in a reasonable and predictable amount of time.     Precautions / Restrictions Precautions Precautions: None      Mobility  Bed Mobility Overal bed mobility: Needs Assistance Bed Mobility: Sit to Supine       Sit  to supine: Supervision   General bed mobility comments: in chair at arrival    Transfers Overall transfer level: Needs assistance Equipment used: Rolling walker (2 wheels) Transfers: Sit to/from Stand Sit to Stand: Contact guard assist                Ambulation/Gait Ambulation/Gait assistance: Contact guard assist Gait Distance (Feet): 40 Feet Assistive device: Rolling walker (2 wheels) Gait Pattern/deviations: Decreased stride length, Step-through pattern, Trunk flexed Gait velocity: decreased     General Gait Details: Min cues posture and RW proximity  Stairs            Wheelchair Mobility     Tilt Bed    Modified Rankin (Stroke Patients Only)       Balance Overall balance assessment: Needs assistance Sitting-balance support: No upper extremity supported Sitting balance-Leahy Scale: Good     Standing balance support: No upper extremity supported, Bilateral upper extremity supported Standing balance-Leahy Scale: Fair Standing balance comment: RW to ambulate but could static stand without support                             Pertinent Vitals/Pain Pain Assessment Pain Assessment: 0-10 Pain Score: 5  Pain Location: generalized - soreness worse bottom and back Pain Descriptors / Indicators: Sore Pain Intervention(s): Limited activity within patient's tolerance, Monitored during session    Home Living Family/patient expects to be discharged to:: Private residence Living Arrangements: Spouse/significant other (he is currently at [facility]) Available Help at Discharge: Friend(s);Available PRN/intermittently Type  of Home: House Home Access: Ramped entrance       Home Layout: One level Home Equipment: Cane - quad;Shower seat      Prior Function Prior Level of Function : Independent/Modified Independent;Driving             Mobility Comments: She used a quad cane for ambulation outside the home. Could ambulate in community ADLs Comments:  She was modified independent to independent with ADLs, cooking, cleaning, and driving.     Extremity/Trunk Assessment   Upper Extremity Assessment Upper Extremity Assessment: Overall WFL for tasks assessed    Lower Extremity Assessment Lower Extremity Assessment: Overall WFL for tasks assessed    Cervical / Trunk Assessment Cervical / Trunk Assessment: Normal  Communication      Cognition Arousal: Alert Behavior During Therapy: WFL for tasks assessed/performed Overall Cognitive Status: Within Functional Limits for tasks assessed                                 General Comments: Oriented x4, able to follow commands without difficulty        General Comments      Exercises     Assessment/Plan    PT Assessment Patient needs continued PT services  PT Problem List Decreased strength;Cardiopulmonary status limiting activity;Decreased activity tolerance;Decreased balance;Decreased mobility;Decreased knowledge of use of DME       PT Treatment Interventions DME instruction;Therapeutic exercise;Gait training;Stair training;Functional mobility training;Therapeutic activities;Patient/family education;Balance training;Modalities    PT Goals (Current goals can be found in the Care Plan section)  Acute Rehab PT Goals Patient Stated Goal: return home PT Goal Formulation: With patient Time For Goal Achievement: 07/28/23 Potential to Achieve Goals: Good    Frequency Min 1X/week     Co-evaluation               AM-PAC PT "6 Clicks" Mobility  Outcome Measure Help needed turning from your back to your side while in a flat bed without using bedrails?: A Little Help needed moving from lying on your back to sitting on the side of a flat bed without using bedrails?: A Little Help needed moving to and from a bed to a chair (including a wheelchair)?: A Little Help needed standing up from a chair using your arms (e.g., wheelchair or bedside chair)?: A Little Help  needed to walk in hospital room?: A Little Help needed climbing 3-5 steps with a railing? : A Little 6 Click Score: 18    End of Session Equipment Utilized During Treatment: Gait belt Activity Tolerance: Patient tolerated treatment well Patient left: in bed;with call bell/phone within reach;with bed alarm set Nurse Communication: Mobility status PT Visit Diagnosis: Other abnormalities of gait and mobility (R26.89)    Time: 9381-8299 PT Time Calculation (min) (ACUTE ONLY): 20 min   Charges:   PT Evaluation $PT Eval Low Complexity: 1 Low   PT General Charges $$ ACUTE PT VISIT: 1 Visit         Anise Salvo, PT Acute Rehab Services [facility] [phone]   Rayetta Humphrey 07/14/2023, 4:51 PM

## 2023-07-14 NOTE — Progress Notes (Signed)
  Echocardiogram 2D Echocardiogram has been performed.  [name] [name] 07/14/2023, 1:35 PM

## 2023-07-14 NOTE — TOC Initial Note (Addendum)
Transition of Care [facility]) - Initial/Assessment Note    Patient Details  Name: [name] [name] MRN: [id number] Date of Birth: 1956-09-02  Transition of Care [facility]) CM/SW Contact:    Howell Rucks, RN Phone Number: 07/14/2023, 3:17 PM  Clinical Narrative:  Met with pt at bedside to introduce role of TOC/NCM and review for dc planning. Pt reports she has an established PCP and pharmacy, pt reports no  current home care services home DME, pt requesting RW for home, await PT eval for equipment recommendation.  Pt reports her spouse is currently in rehab at [facility] she has support from friends and family, confirmed she has  transportation at discharge. SDOH for food insecurity, food resources added to AVS. PT eval pending, await recommendation.                Expected Discharge Plan: Home/Self Care Barriers to Discharge: Continued Medical Work up   Patient Goals and CMS Choice Patient states their goals for this hospitalization and ongoing recovery are:: return home with support from friends and family , pt reports her spouse is currently in short term rehab at [facility]          Expected Discharge Plan and Services       Living arrangements for the past 2 months: Single Family Home                                      Prior Living Arrangements/Services Living arrangements for the past 2 months: Single Family Home Lives with:: Spouse Patient language and need for interpreter reviewed:: Yes Do you feel safe going back to the place where you live?: Yes      Need for Family Participation in Patient Care: Yes (Comment) Care giver support system in place?: Yes (comment)   Criminal Activity/Legal Involvement Pertinent to Current Situation/Hospitalization: No - Comment as needed  Activities of Daily Living Home Assistive Devices/Equipment: Eyeglasses, Dentures (specify type), Cane (specify quad or straight) ADL Screening (condition at time of  admission) Patient's cognitive ability adequate to safely complete daily activities?: Yes Is the patient deaf or have difficulty hearing?: No Does the patient have difficulty seeing, even when wearing glasses/contacts?: No Does the patient have difficulty concentrating, remembering, or making decisions?: Yes Patient able to express need for assistance with ADLs?: Yes Does the patient have difficulty dressing or bathing?: Yes (just getting into the tub) Independently performs ADLs?: No Communication: Independent Dressing (OT): Independent Grooming: Independent Feeding: Independent Bathing: Needs assistance (getting into tub) Is this a change from baseline?: Change from baseline, expected to last <3 days Toileting: Independent In/Out Bed: Independent with device (comment) (cane) Walks in Home: Independent with device (comment) (cane) Does the patient have difficulty walking or climbing stairs?: Yes Weakness of Legs: Both Weakness of Arms/Hands: None  Permission Sought/Granted Permission sought to share information with : Case Manager Permission granted to share information with : Yes, Verbal Permission Granted  Share Information with NAME: Fannie Knee, RN           Emotional Assessment Appearance:: Appears stated age Attitude/Demeanor/Rapport: Gracious Affect (typically observed): Accepting Orientation: : Oriented to Self, Oriented to Place, Oriented to  Time, Oriented to Situation Alcohol / Substance Use: Not Applicable Psych Involvement: No (comment)  Admission diagnosis:  Syncope and collapse [R55] Syncope [R55] Non-traumatic rhabdomyolysis [M62.82] Patient Active Problem List   Diagnosis Date Noted   Syncope 07/13/2023  Urinary incontinence 04/14/2023   Falls 04/13/2023   Thrombocytopenia (HCC) 04/13/2023   Bacteremia due to Escherichia coli 02/07/2023   Osteopenia 09/16/2022   Pre-diabetes 08/01/2022   Posterior right knee pain 02/06/2019   OCD (obsessive  compulsive disorder) 08/13/2018   Aortic atherosclerosis (HCC) 08/17/2016   Obesity hypoventilation syndrome (HCC) 01/21/2015   Healthcare maintenance 07/01/2014   OSA (obstructive sleep apnea) 04/04/2013   Anxiety disorder 07/02/2012   Depression 06/28/2012   Morbid obesity (HCC) 03/02/2012   GERD (gastroesophageal reflux disease) 02/05/2012   Smoking 02/05/2012   PCP:  Rudene Christians, DO Pharmacy:   [facility] [phone] - [place], [place] - [address] AT [facility] [address] [facility] [address] [place] [address] Phone: [phone] Fax: [phone]     Social Determinants of Health (SDOH) Social History: SDOH Screenings   Food Insecurity: No Food Insecurity (07/14/2023)  Housing: Low Risk  (07/14/2023)  Transportation Needs: No Transportation Needs (07/14/2023)  Utilities: Not At Risk (07/14/2023)  Alcohol Screen: Low Risk  (12/07/2022)  Depression (PHQ2-9): Medium Risk (04/27/2023)  Financial Resource Strain: Medium Risk (12/07/2022)  Physical Activity: Insufficiently Active (12/07/2022)  Social Connections: Socially Integrated (12/07/2022)  Stress: No Stress Concern Present (12/07/2022)  Tobacco Use: Medium Risk (07/13/2023)   SDOH Interventions:     Readmission Risk Interventions    07/14/2023    3:15 PM  Readmission Risk Prevention Plan  Transportation Screening Complete  PCP or Specialist Appt within 5-7 Days Complete  Home Care Screening Complete  Medication Review (RN CM) Complete

## 2023-07-14 NOTE — Progress Notes (Signed)
Orthostatic vitals were completed. See flowsheets.

## 2023-07-14 NOTE — Plan of Care (Signed)

## 2023-07-14 NOTE — Progress Notes (Signed)
PROGRESS NOTE    [name] [name]  YNW:[id number] DOB: Nov 26, 1955 DOA: 07/13/2023 PCP: Rudene Christians, DO   Brief Narrative:  The patient is a 67 year old obese Caucasian female with a past medical history significant for but limited to chronic anxiety on alprazolam, chronic depression, hypertension, hyperlipidemia, GERD, obesity who is on Ozempic for weight loss as well as other comorbidities who presents to the ED via EMS after passing out in her bathroom.  She took her Xanax pill prior to getting into the bathtub and does not recall what happened and woke up in the bathtub about 12 hours later.  She slipped and fell and lost consciousness and realized that she was in the bathtub after hours and was sore.  She states that her husband is a nursing home and she called for help and she was heard by landlord who called EMS.  She is brought to the ED and underwent a head CT which was nonacute.  UA was positive for pyuria and CPK is greater than 4600.  She is initially on IV antibiotics with ceftriaxone and given IV fluid hydration.  Tried hospitalist team was asked to admit this patient for syncope of unclear etiology as well as sepsis likely secondary to suspected UTI and rhabdomyolysis.  Currently she is getting IV for hydration PT OT recommending home health.  Assessment and Plan:  Syncope, Unclear Etiology -Possibly contributed by polypharmacy -Hold off gabapentin and trazodone -The patient is also on Xanax as needed, restart at lowest dose, 0.25 mg twice daily as needed for anxiety -Obtain orthostatic vital signs in the a.m. -Continue to monitor on telemetry -Echocardiogram done and showed an EF of 60 to 65% -Continue IV fluid hydration with normal saline at 125 mL/h for 2 days -Obtain PT OT to further evaluate and treat and they are recommending home health   Sepsis secondary to suspected UTI , POA -She met sepsis criteria as she had a leukocytosis and was tachypneic on  admission -Urinalysis as below -Follow urine culture and peripheral blood cultures x 2 -Will continue IV ceftriaxone empirically that was started in the ED -Will continue to monitor temperature and fever curve and WBCs -Maintain MAP greater than 65   Chronic Anxiety/Depression -Resume home as needed Xanax for anxiety at lowest dose of 0.25 mg p.o. twice daily as needed anxiety -Resume home buspirone 5 mg p.o. 3 times daily and sertraline 100 mg p.o. nightly   Hyperlipidemia -Continue to hold statin given her rhabdomyolysis   GERD/GI Prophylaxis -Resume home PPI with pantoprazole 40 mg p.o. daily  Rhabdomyolysis -CK Level Trend: 4631 -> 4485 -Continue with IV fluid hydration and repeat CK in the morning -Currently getting normal saline at 125 mL/h for 2 days -Urinalysis done and showed a turbid appearance with red color urine, small hemoglobin, 5 ketones, large leukocytes, positive nitrites, many bacteria, 6-10 RBCs per high-power field, greater than 50 WBCs with urine culture pending and blood cultures pending  Hypokalemia -Patient's K+ Level Trend: Recent Labs  Lab 07/13/23 2003 07/14/23 0429  K 4.0 3.3*  -Replete with po Kcl 40 mEQ BID -Continue to Monitor and Replete as Necessary -Repeat CMP in the AM   Abnormal LFTs -Mild and AST is abnormal and ALT is normal -Likely in the setting of rhabdomyolysis given that she denies the use of alcohol -LFT Trend: Recent Labs  Lab 07/13/23 2003 07/14/23 0429  AST 114* 126*  ALT 25 26  -Will continue to monitor patient's hepatic function trend and if necessary  we will obtain a right upper quadrant ultrasound and acute hepatitis panel if necessary  Hyperbilirubinemia  -Bilirubin Trend: Recent Labs  Lab 07/13/23 2003 07/14/23 0429  BILITOT 2.7* 2.0*  -Continue to Monitor and Trend and Repeat CMP in the AM   Hypoalbuminemia -Patient's Albumin Trend: Recent Labs  Lab 07/13/23 2003 07/14/23 0429  ALBUMIN 3.9 3.3*   -Continue to Monitor and Trend and repeat CMP in the AM  Obesity -Complicates overall prognosis and care -Estimated body mass index is 32.75 kg/m as calculated from the following:   Height as of 04/27/23: 5\' 8"  (1.727 m).   Weight as of this encounter: 97.7 kg.  -Weight Loss and Dietary Counseling given   DVT prophylaxis: enoxaparin (LOVENOX) injection 40 mg Start: 07/14/23 1000    Code Status: Full Code Family Communication: No family currently at bedside  Disposition Plan:  Level of care: Telemetry Status is: Inpatient Remains inpatient appropriate because: Needs further clinical improvement and improvement in her rhabdo as well as labs and further workup of her syncope  Consultants:  None  Procedures:  As delineated as above  Antimicrobials:  Anti-infectives (From admission, onward)    Start     Dose/Rate Route Frequency Ordered Stop   07/14/23 1000  cefTRIAXone (ROCEPHIN) 2 g in sodium chloride 0.9 % 100 mL IVPB        2 g 200 mL/hr over 30 Minutes Intravenous Every 24 hours 07/13/23 2305     07/13/23 2230  cefTRIAXone (ROCEPHIN) 1 g in sodium chloride 0.9 % 100 mL IVPB        1 g 200 mL/hr over 30 Minutes Intravenous  Once 07/13/23 2224 07/14/23 0010       Subjective: Seen and examined at bedside and states her back and buttock are sore.  No nausea or vomiting.  Feels okay.  Denies any lightheadedness or dizziness.  Objective: Vitals:   07/14/23 0702 07/14/23 0710 07/14/23 1120 07/14/23 1532  BP: 127/75  125/75 126/77  Pulse: 81  84 77  Resp: 20  20 20   Temp: 98 F (36.7 C)  98.2 F (36.8 C) 98.3 F (36.8 C)  TempSrc: Oral  Oral Oral  SpO2: 94%  93% 94%  Weight:      Height:  5\' 8"  (1.727 m)      Intake/Output Summary (Last 24 hours) at 07/14/2023 1735 Last data filed at 07/14/2023 1400 Gross per 24 hour  Intake 2656.43 ml  Output 150 ml  Net 2506.43 ml   Filed Weights   07/14/23 0653  Weight: 97.7 kg   Examination: Physical  Exam:  Constitutional: WN/WD obese Caucasian female in no acute distress Respiratory: Diminished to auscultation bilaterally with coarse breath sounds, no wheezing, rales, rhonchi or crackles. Normal respiratory effort and patient is not tachypenic. No accessory muscle use.  Unlabored breathing Cardiovascular: RRR, no murmurs / rubs / gallops. S1 and S2 auscultated. No extremity edema.   Abdomen: Soft, non-tender, distended secondary to body habitus. Bowel sounds positive.  GU: Deferred. Musculoskeletal: No clubbing / cyanosis of digits/nails. No joint deformity upper and lower extremities.  Skin: No rashes, lesions, ulcers limited skin evaluation. No induration; Warm and dry.  Neurologic: CN 2-12 grossly intact with no focal deficits. Romberg sign and cerebellar reflexes not assessed.  Psychiatric: Normal judgment and insight. Alert and oriented x 3. Normal mood and appropriate affect.   Data Reviewed: I have personally reviewed following labs and imaging studies  CBC: Recent Labs  Lab 07/13/23 2003 07/14/23 6045  WBC 15.5* 10.4  HGB 17.4* 15.6*  HCT 50.6* 46.4*  MCV 90.0 92.1  PLT 151 121*   Basic Metabolic Panel: Recent Labs  Lab 07/13/23 2003 07/14/23 0429  NA 139 136  K 4.0 3.3*  CL 103 102  CO2 23 23  GLUCOSE 110* 105*  BUN 13 14  CREATININE 0.55 0.54  CALCIUM 9.7 8.6*  MG  --  2.1  PHOS  --  3.0   GFR: Estimated Creatinine Clearance: 83.4 mL/min (by C-G formula based on SCr of 0.54 mg/dL). Liver Function Tests: Recent Labs  Lab 07/13/23 2003 07/14/23 0429  AST 114* 126*  ALT 25 26  ALKPHOS 99 84  BILITOT 2.7* 2.0*  PROT 7.3 6.0*  ALBUMIN 3.9 3.3*   Recent Labs  Lab 07/13/23 2003  LIPASE 23   No results for input(s): "AMMONIA" in the last 168 hours. Coagulation Profile: No results for input(s): "INR", "PROTIME" in the last 168 hours. Cardiac Enzymes: Recent Labs  Lab 07/13/23 2003 07/14/23 0429  CKTOTAL 4,631* 4,485*   BNP (last 3  results) No results for input(s): "PROBNP" in the last 8760 hours. HbA1C: No results for input(s): "HGBA1C" in the last 72 hours. CBG: Recent Labs  Lab 07/13/23 1840  GLUCAP 122*   Lipid Profile: No results for input(s): "CHOL", "HDL", "LDLCALC", "TRIG", "CHOLHDL", "LDLDIRECT" in the last 72 hours. Thyroid Function Tests: No results for input(s): "TSH", "T4TOTAL", "FREET4", "T3FREE", "THYROIDAB" in the last 72 hours. Anemia Panel: No results for input(s): "VITAMINB12", "FOLATE", "FERRITIN", "TIBC", "IRON", "RETICCTPCT" in the last 72 hours. Sepsis Labs: No results for input(s): "PROCALCITON", "LATICACIDVEN" in the last 168 hours.  No results found for this or any previous visit (from the past 240 hour(s)).   Radiology Studies: ECHOCARDIOGRAM COMPLETE  Result Date: 07/14/2023    ECHOCARDIOGRAM REPORT   Patient Name:   CALY BUENAFLOR [facility] Date of Exam: 07/14/2023 Medical Rec #:  [id number]             Height:       68.0 in Accession #:    [id number]            Weight:       215.4 lb Date of Birth:  1956/06/04            BSA:          2.109 m Patient Age:    21 years              BP:           125/75 mmHg Patient Gender: F                     HR:           79 bpm. Exam Location:  Inpatient Procedure: 2D Echo, Cardiac Doppler and Color Doppler Indications:    Syncope  History:        Patient has prior history of Echocardiogram examinations, most                 recent 12/02/2020. COPD; Risk Factors:Sleep Apnea, Obesity and                 Former Smoker. Oxygen therapy at home.  Sonographer:    Milda Smart Referring Phys: [id number] Darlin Drop  Sonographer Comments: Patient is obese. Image acquisition challenging due to patient body habitus and Image acquisition challenging due to respiratory motion. IMPRESSIONS  1. Left ventricular ejection fraction, by estimation, is 60  to 65%. Left ventricular ejection fraction by 2D MOD biplane is 64.7 %. The left ventricle has normal function. The left  ventricle has no regional wall motion abnormalities. Left ventricular diastolic parameters are consistent with Grade I diastolic dysfunction (impaired relaxation). Elevated left ventricular end-diastolic pressure. The E/e' is 16.  2. Right ventricular systolic function is low normal. The right ventricular size is mildly enlarged. Tricuspid regurgitation signal is inadequate for assessing PA pressure.  3. Right atrial size was mildly dilated.  4. The mitral valve was not well visualized. Mild mitral valve regurgitation. No evidence of mitral stenosis.  5. The aortic valve was not well visualized. Aortic valve regurgitation is not visualized. No aortic stenosis is present.  6. Aortic dilatation noted. There is borderline dilatation of the aortic root, measuring 38 mm. There is borderline dilatation of the ascending aorta, measuring 39 mm.  7. The inferior vena cava is dilated in size with >50% respiratory variability, suggesting right atrial pressure of 8 mmHg. Comparison(s): Changes from prior study are noted. 12/02/2020: LVEF 55-60%, normal RV systolic function. FINDINGS  Left Ventricle: Left ventricular ejection fraction, by estimation, is 60 to 65%. Left ventricular ejection fraction by 2D MOD biplane is 64.7 %. The left ventricle has normal function. The left ventricle has no regional wall motion abnormalities. The left ventricular internal cavity size was normal in size. There is no left ventricular hypertrophy. Left ventricular diastolic parameters are consistent with Grade I diastolic dysfunction (impaired relaxation). Elevated left ventricular end-diastolic pressure. The E/e' is 16. Right Ventricle: The right ventricular size is mildly enlarged. No increase in right ventricular wall thickness. Right ventricular systolic function is low normal. Tricuspid regurgitation signal is inadequate for assessing PA pressure. Left Atrium: Left atrial size was normal in size. Right Atrium: Right atrial size was mildly  dilated. Pericardium: There is no evidence of pericardial effusion. Mitral Valve: The mitral valve was not well visualized. Mild mitral valve regurgitation. No evidence of mitral valve stenosis. MV peak gradient, 4.2 mmHg. The mean mitral valve gradient is 2.0 mmHg. Tricuspid Valve: The tricuspid valve is not well visualized. Tricuspid valve regurgitation is not demonstrated. No evidence of tricuspid stenosis. Aortic Valve: The aortic valve was not well visualized. Aortic valve regurgitation is not visualized. No aortic stenosis is present. Pulmonic Valve: The pulmonic valve was not well visualized. Pulmonic valve regurgitation is not visualized. No evidence of pulmonic stenosis. Aorta: Aortic dilatation noted. There is borderline dilatation of the aortic root, measuring 38 mm. There is borderline dilatation of the ascending aorta, measuring 39 mm. Venous: The inferior vena cava is dilated in size with greater than 50% respiratory variability, suggesting right atrial pressure of 8 mmHg. IAS/Shunts: No atrial level shunt detected by color flow Doppler.  LEFT VENTRICLE PLAX 2D                        Biplane EF (MOD) LVIDd:         5.30 cm         LV Biplane EF:   Left LVIDs:         4.30 cm                          ventricular LV PW:         1.10 cm  ejection LV IVS:        0.90 cm                          fraction by LVOT diam:     2.10 cm                          2D MOD LV SV:         89                               biplane is LV SV Index:   42                               64.7 %. LVOT Area:     3.46 cm                                Diastology                                LV e' medial:    3.48 cm/s LV Volumes (MOD)               LV E/e' medial:  19.9 LV vol d, MOD    83.9 ml       LV e' lateral:   5.66 cm/s A2C:                           LV E/e' lateral: 12.3 LV vol d, MOD    78.6 ml A4C: LV vol s, MOD    31.7 ml A2C: LV vol s, MOD    27.8 ml A4C: LV SV MOD A2C:   52.2 ml LV SV MOD A4C:    78.6 ml LV SV MOD BP:    55.9 ml RIGHT VENTRICLE RV Basal diam:  4.00 cm RV Mid diam:    3.30 cm RV S prime:     9.90 cm/s TAPSE (M-mode): 1.4 cm LEFT ATRIUM             Index        RIGHT ATRIUM           Index LA diam:        3.20 cm 1.52 cm/m   RA Area:     12.90 cm LA Vol (A2C):   39.2 ml 18.56 ml/m  RA Volume:   30.60 ml  14.51 ml/m LA Vol (A4C):   22.4 ml 10.62 ml/m LA Biplane Vol: 33.7 ml 15.98 ml/m  AORTIC VALVE LVOT Vmax:   114.00 cm/s LVOT Vmean:  77.800 cm/s LVOT VTI:    0.256 m  AORTA Ao Root diam: 3.80 cm Ao Asc diam:  3.90 cm MITRAL VALVE MV Area (PHT): 2.48 cm    SHUNTS MV Area VTI:   3.46 cm    Systemic VTI:  0.26 m MV Peak grad:  4.2 mmHg    Systemic Diam: 2.10 cm MV Mean grad:  2.0 mmHg MV Vmax:       1.03 m/s MV Vmean:      61.8 cm/s MV Decel Time: 306 msec MR Peak grad: 22.5 mmHg MR Vmax:  237.00 cm/s MV E velocity: 69.40 cm/s MV A velocity: 89.10 cm/s MV E/A ratio:  0.78 Zoila Shutter MD Electronically signed by Zoila Shutter MD Signature Date/Time: 07/14/2023/2:04:48 PM    Final    CT Head Wo Contrast  Result Date: 07/13/2023 CLINICAL DATA:  Brought in due to fall in bathtub, family believe patient bathtub for 24 hours. Patient has no memory of the event. Patient not oriented to time EXAM: CT HEAD WITHOUT CONTRAST TECHNIQUE: Contiguous axial images were obtained from the base of the skull through the vertex without intravenous contrast. RADIATION DOSE REDUCTION: This exam was performed according to the departmental dose-optimization program which includes automated exposure control, adjustment of the mA and/or kV according to patient size and/or use of iterative reconstruction technique. COMPARISON:  CT head 04/21/2023 FINDINGS: Brain: No intracranial hemorrhage, mass effect, or evidence of acute infarct. No hydrocephalus. No extra-axial fluid collection. Vascular: No hyperdense vessel or unexpected calcification. Skull: No fracture or focal lesion. Sinuses/Orbits: No acute  finding. Other: None. IMPRESSION: No acute intracranial abnormality. Electronically Signed   By: Minerva Fester M.D.   On: 07/13/2023 20:57   DG Chest 2 View  Result Date: 07/13/2023 CLINICAL DATA:  Fall in bathtub. Family believe patient was in bathroom for 24 hours. No memory of the event. Abrasion to right arm. EXAM: CHEST - 2 VIEW COMPARISON:  Radiograph 04/20/2023 FINDINGS: Stable cardiomediastinal silhouette. Aortic atherosclerotic calcification. Hyperinflation and chronic bronchitic change. No focal consolidation, pleural effusion, or pneumothorax. No displaced rib fractures. IMPRESSION: No acute cardiopulmonary disease. Electronically Signed   By: Minerva Fester M.D.   On: 07/13/2023 20:33    Scheduled Meds:  busPIRone  5 mg Oral TID   enoxaparin (LOVENOX) injection  40 mg Subcutaneous Q24H   pantoprazole  40 mg Oral Daily   potassium chloride  40 mEq Oral BID   sertraline  100 mg Oral QHS   Continuous Infusions:  sodium chloride 125 mL/hr at 07/14/23 1716   cefTRIAXone (ROCEPHIN)  IV 2 g (07/14/23 0925)    LOS: 1 day   Marguerita Merles, DO [facility] Available via Epic secure chat 7am-7pm After these hours, please refer to coverage provider listed on [email or web address] 07/14/2023, 5:35 PM

## 2023-07-14 NOTE — Evaluation (Addendum)
Occupational Therapy Evaluation Patient Details Name: [name] [name] MRN: [id number] DOB: 1956/10/24 Today's Date: 07/14/2023   History of Present Illness [name] [name] is a 67 yr old female admitted to the hospital after she passed out in her shower. She was found to have possible UTI and rhabdomyolysis. PMH: anxiety, depression, HTN, HLD   Clinical Impression   The pt is currently presenting below her baseline level of functioning for self-care management, given the below listed deficits (see OT problem list). She was noted to report significant generalized soreness. She also appeared to be with slight deconditioning. As such, she required CGA for tasks, including bed mobility, sit to stand, ambulating with a RW, and grooming in standing at the sink. She will benefit from further OT services in the acute setting to maximize her independence with self-care tasks and to facilitate her safe return home.        If plan is discharge home, recommend the following: Help with stairs or ramp for entrance;Assistance with cooking/housework;Assist for transportation    Functional Status Assessment  Patient has had a recent decline in their functional status and demonstrates the ability to make significant improvements in function in a reasonable and predictable amount of time.  Equipment Recommendations  Other (comment) (Rolling walker)    Recommendations for Other Services       Precautions / Restrictions Precautions Precautions: None Restrictions Weight Bearing Restrictions: No      Mobility Bed Mobility Overal bed mobility: Needs Assistance Bed Mobility: Supine to Sit     Supine to sit: Contact guard, HOB elevated, Used rails          Transfers Overall transfer level: Needs assistance Equipment used: Rolling walker (2 wheels) Transfers: Sit to/from Stand Sit to Stand: Contact guard assist                  Balance     Sitting balance-Leahy Scale: Good          Standing balance comment: CGA with rolling walker         ADL either performed or assessed with clinical judgement   ADL Overall ADL's : Needs assistance/impaired;Independent Eating/Feeding: Sitting   Grooming: Contact guard assist;Standing Grooming Details (indicate cue type and reason): She performed hand washing in standing at sink level.         Upper Body Dressing : Set up;Sitting   Lower Body Dressing: Contact guard assist;Sit to/from stand   Toilet Transfer: Contact guard assist;Ambulation;Rolling walker (2 wheels);Grab bars;Regular Teacher, adult education Details (indicate cue type and reason): She ambulated to & from the bathroom in her room using a RW. Toileting- Clothing Manipulation and Hygiene: Moderate assistance;Sit to/from stand Toileting - Clothing Manipulation Details (indicate cue type and reason): She was limited by generalized soreness, subsequently requiring assist for posterior peri-hygiene in standing after having a bowel movement.              Pertinent Vitals/Pain Pain Assessment Pain Assessment: No/denies pain (She denied having pain, however reported significant generalized soreness.)        Communication Communication Communication: No apparent difficulties   Cognition Arousal: Alert Behavior During Therapy: WFL for tasks assessed/performed Overall Cognitive Status: Within Functional Limits for tasks assessed      General Comments: Oriented x4, able to follow commands without difficulty                Home Living Family/patient expects to be discharged to:: Private residence Living Arrangements: Spouse/significant other   Type  of Home: House Home Access: Ramped entrance     Home Layout: One level     Bathroom Shower/Tub: Tub/shower unit         Home Equipment: Cane - quad;Shower seat   Additional Comments: Her spouse is currently in a SNF.      Prior Functioning/Environment Prior Level of Function :  Independent/Modified Independent;Driving             Mobility Comments: She used a cane for ambulation outside the home. ADLs Comments: She was modified independent to independent with ADLs, cooking, cleaning, and driving.        OT Problem List: Decreased strength;Impaired balance (sitting and/or standing)      OT Treatment/Interventions: Self-care/ADL training;Therapeutic exercise;DME and/or AE instruction;Energy conservation;Therapeutic activities;Balance training;Patient/family education    OT Goals(Current goals can be found in the care plan section) Acute Rehab OT Goals Patient Stated Goal: for soreness to resolve and to return to normal activities OT Goal Formulation: With patient Time For Goal Achievement: 07/28/23 Potential to Achieve Goals: Good ADL Goals Pt Will Perform Grooming: with modified independence;standing Pt Will Perform Lower Body Dressing: with modified independence;sit to/from stand Pt Will Transfer to Toilet: with modified independence;ambulating Pt Will Perform Toileting - Clothing Manipulation and hygiene: with modified independence;sit to/from stand  OT Frequency: Min 1X/week       AM-PAC OT "6 Clicks" Daily Activity     Outcome Measure Help from another person eating meals?: None Help from another person taking care of personal grooming?: A Little Help from another person toileting, which includes using toliet, bedpan, or urinal?: A Lot Help from another person bathing (including washing, rinsing, drying)?: A Little Help from another person to put on and taking off regular upper body clothing?: A Little Help from another person to put on and taking off regular lower body clothing?: A Little 6 Click Score: 18   End of Session Equipment Utilized During Treatment: Rolling walker (2 wheels) Nurse Communication: Mobility status  Activity Tolerance: Patient tolerated treatment well Patient left: in chair;with call bell/phone within reach;with chair  alarm set  OT Visit Diagnosis: Unsteadiness on feet (R26.81)                Time: 0865-7846 OT Time Calculation (min): 33 min Charges:  OT General Charges $OT Visit: 1 Visit OT Evaluation $OT Eval Low Complexity: 1 Low OT Treatments $Self Care/Home Management : 8-22 mins   Reuben Likes, OTR/L 07/14/2023, 12:03 PM

## 2023-07-15 DIAGNOSIS — F419 Anxiety disorder, unspecified: Secondary | ICD-10-CM | POA: Diagnosis not present

## 2023-07-15 DIAGNOSIS — D72829 Elevated white blood cell count, unspecified: Secondary | ICD-10-CM

## 2023-07-15 DIAGNOSIS — R55 Syncope and collapse: Secondary | ICD-10-CM | POA: Diagnosis not present

## 2023-07-15 DIAGNOSIS — M6282 Rhabdomyolysis: Secondary | ICD-10-CM | POA: Diagnosis not present

## 2023-07-15 LAB — BLOOD CULTURE ID PANEL (REFLEXED) - BCID2

## 2023-07-15 LAB — CBC WITH DIFFERENTIAL/PLATELET
Abs Immature Granulocytes: 0.01 10*3/uL (ref 0.00–0.07)
Basophils Absolute: 0 10*3/uL (ref 0.0–0.1)
Basophils Relative: 1 %
Eosinophils Absolute: 0.1 10*3/uL (ref 0.0–0.5)
Eosinophils Relative: 1 %
HCT: 43.2 % (ref 36.0–46.0)
Hemoglobin: 14.7 g/dL (ref 12.0–15.0)
Immature Granulocytes: 0 %
Lymphocytes Relative: 20 %
Lymphs Abs: 1.5 10*3/uL (ref 0.7–4.0)
MCH: 31.6 pg (ref 26.0–34.0)
MCHC: 34 g/dL (ref 30.0–36.0)
MCV: 92.9 fL (ref 80.0–100.0)
Monocytes Absolute: 0.7 10*3/uL (ref 0.1–1.0)
Monocytes Relative: 9 %
Neutro Abs: 5 10*3/uL (ref 1.7–7.7)
Neutrophils Relative %: 69 %
Platelets: 115 10*3/uL — ABNORMAL LOW (ref 150–400)
RBC: 4.65 MIL/uL (ref 3.87–5.11)
RDW: 13.9 % (ref 11.5–15.5)
WBC: 7.3 10*3/uL (ref 4.0–10.5)
nRBC: 0 % (ref 0.0–0.2)

## 2023-07-15 LAB — COMPREHENSIVE METABOLIC PANEL
ALT: 35 U/L (ref 0–44)
AST: 155 U/L — ABNORMAL HIGH (ref 15–41)
Albumin: 3.3 g/dL — ABNORMAL LOW (ref 3.5–5.0)
Alkaline Phosphatase: 75 U/L (ref 38–126)
Anion gap: 9 (ref 5–15)
BUN: 9 mg/dL (ref 8–23)
CO2: 23 mmol/L (ref 22–32)
Calcium: 8.8 mg/dL — ABNORMAL LOW (ref 8.9–10.3)
Chloride: 106 mmol/L (ref 98–111)
Creatinine, Ser: 0.54 mg/dL (ref 0.44–1.00)
GFR, Estimated: >60 mL/min (ref >60)
Glucose, Bld: 103 mg/dL — ABNORMAL HIGH (ref 70–99)
Potassium: 4.1 mmol/L (ref 3.5–5.1)
Sodium: 138 mmol/L (ref 135–145)
Total Bilirubin: 0.9 mg/dL (ref 0.3–1.2)
Total Protein: 5.9 g/dL — ABNORMAL LOW (ref 6.5–8.1)

## 2023-07-15 LAB — MAGNESIUM: Magnesium: 2.1 mg/dL (ref 1.7–2.4)

## 2023-07-15 LAB — CK: Total CK: 4579 U/L — ABNORMAL HIGH (ref 38–234)

## 2023-07-15 LAB — PHOSPHORUS: Phosphorus: 3.1 mg/dL (ref 2.5–4.6)

## 2023-07-15 MED ORDER — SODIUM CHLORIDE 0.9 % IV SOLN: INTRAVENOUS | Status: AC

## 2023-07-15 MED ORDER — TRAZODONE HCL 100 MG PO TABS
100.0000 mg | ORAL_TABLET | Freq: Every evening | ORAL | Status: DC | PRN
  Administered 2023-07-15 – 2023-07-16 (×2): 100 mg via ORAL
  Filled 2023-07-15 (×2): qty 1

## 2023-07-15 MED ORDER — SODIUM CHLORIDE 0.9 % IV BOLUS
1000.0000 mL | Freq: Once | INTRAVENOUS | Status: AC
  Administered 2023-07-15: 1000 mL via INTRAVENOUS

## 2023-07-15 MED ORDER — ALPRAZOLAM 0.5 MG PO TABS
0.5000 mg | ORAL_TABLET | Freq: Three times a day (TID) | ORAL | Status: DC | PRN
  Administered 2023-07-15 – 2023-07-16 (×2): 0.5 mg via ORAL
  Filled 2023-07-15 (×2): qty 1

## 2023-07-15 MED ORDER — HYDROXYZINE HCL 25 MG PO TABS
25.0000 mg | ORAL_TABLET | Freq: Three times a day (TID) | ORAL | Status: DC | PRN
  Administered 2023-07-15: 25 mg via ORAL
  Filled 2023-07-15: qty 1

## 2023-07-15 NOTE — Progress Notes (Signed)
PHARMACY - PHYSICIAN COMMUNICATION CRITICAL VALUE ALERT - BLOOD CULTURE IDENTIFICATION (BCID)  [name] [name] is an 67 y.o. female who presented to [facility] on 07/13/2023 with a chief complaint of syncope, suspected UTI and rhabdomyolysis.  Assessment:  She has been on ceftriaxone for presumed UTI.  She is afebrile and WBC decreased to WNL.   9/5 UCx:  ip 9/6 BCx: aerobic bottle in each of 2 sets +GPC clusters (anaerobic bottles ngtd) 9/7 BCID: Staphylococcus species  Name of physician (or Provider) Contacted: A. Virgel Manifold NP  Current antibiotics: 9/5 Ceftriaxone  Changes to prescribed antibiotics recommended:  Continue Ceftriaxone   Results for orders placed or performed during the hospital encounter of 07/13/23  Blood Culture ID Panel (Reflexed) (Collected: 07/14/2023  4:20 AM)  Result Value Ref Range   Enterococcus faecalis NOT DETECTED NOT DETECTED   Enterococcus Faecium NOT DETECTED NOT DETECTED   Listeria monocytogenes NOT DETECTED NOT DETECTED   Staphylococcus species DETECTED (A) NOT DETECTED   Staphylococcus aureus (BCID) NOT DETECTED NOT DETECTED   Staphylococcus epidermidis NOT DETECTED NOT DETECTED   Staphylococcus lugdunensis NOT DETECTED NOT DETECTED   Streptococcus species NOT DETECTED NOT DETECTED   Streptococcus agalactiae NOT DETECTED NOT DETECTED   Streptococcus pneumoniae NOT DETECTED NOT DETECTED   Streptococcus pyogenes NOT DETECTED NOT DETECTED   A.calcoaceticus-baumannii NOT DETECTED NOT DETECTED   Bacteroides fragilis NOT DETECTED NOT DETECTED   Enterobacterales NOT DETECTED NOT DETECTED   Enterobacter cloacae complex NOT DETECTED NOT DETECTED   Escherichia coli NOT DETECTED NOT DETECTED   Klebsiella aerogenes NOT DETECTED NOT DETECTED   Klebsiella oxytoca NOT DETECTED NOT DETECTED   Klebsiella pneumoniae NOT DETECTED NOT DETECTED   Proteus species NOT DETECTED NOT DETECTED   Salmonella species NOT DETECTED NOT DETECTED   Serratia marcescens NOT  DETECTED NOT DETECTED   Haemophilus influenzae NOT DETECTED NOT DETECTED   Neisseria meningitidis NOT DETECTED NOT DETECTED   Pseudomonas aeruginosa NOT DETECTED NOT DETECTED   Stenotrophomonas maltophilia NOT DETECTED NOT DETECTED   Candida albicans NOT DETECTED NOT DETECTED   Candida auris NOT DETECTED NOT DETECTED   Candida glabrata NOT DETECTED NOT DETECTED   Candida krusei NOT DETECTED NOT DETECTED   Candida parapsilosis NOT DETECTED NOT DETECTED   Candida tropicalis NOT DETECTED NOT DETECTED   Cryptococcus neoformans/gattii NOT DETECTED NOT DETECTED    Lynann Beaver PharmD, BCPS [facility] main pharmacy [phone] 07/15/2023 4:08 AM

## 2023-07-15 NOTE — Plan of Care (Signed)

## 2023-07-15 NOTE — Plan of Care (Signed)

## 2023-07-15 NOTE — Progress Notes (Signed)
Mobility Specialist - Progress Note   07/15/23 1341  Mobility  Activity Ambulated with assistance in hallway  Level of Assistance Contact guard assist, steadying assist  Assistive Device Front wheel walker  Distance Ambulated (ft) 40 ft  Range of Motion/Exercises Active  Activity Response Tolerated fair  Mobility Referral Yes  $Mobility charge 1 Mobility  Mobility Specialist Start Time (ACUTE ONLY) 1330  Mobility Specialist Stop Time (ACUTE ONLY) 1341  Mobility Specialist Time Calculation (min) (ACUTE ONLY) 11 min   Pt was found on BSC with NT. Agreeable to ambulate. Grew fatigued with distance. At EOS returned to bathroom with NT.  Billey Chang Mobility Specialist

## 2023-07-15 NOTE — Progress Notes (Signed)
PROGRESS NOTE    [name] [name]  EXB:[id number] DOB: 04-21-1956 DOA: 07/13/2023 PCP: Rudene Christians, DO   Brief Narrative:  The patient is a 67 year old obese Caucasian female with a past medical history significant for but limited to chronic anxiety on alprazolam, chronic depression, hypertension, hyperlipidemia, GERD, obesity who is on Ozempic for weight loss as well as other comorbidities who presents to the ED via EMS after passing out in her bathroom.  She took her Xanax pill prior to getting into the bathtub and does not recall what happened and woke up in the bathtub about 12 hours later.  She slipped and fell and lost consciousness and realized that she was in the bathtub after hours and was sore.  She states that her husband is a nursing home and she called for help and she was heard by landlord who called EMS.  She is brought to the ED and underwent a head CT which was nonacute.  UA was positive for pyuria and CPK is greater than 4600.  She is initially on IV antibiotics with ceftriaxone and given IV fluid hydration.  Tried hospitalist team was asked to admit this patient for syncope of unclear etiology as well as sepsis likely secondary to suspected UTI and rhabdomyolysis.  Currently she is getting IV for hydration and PT OT recommending home health.  Patient threatened to leave AMA today given that she is extremely anxious but then calm down later and we provided her with some Atarax and increased her Xanax dosing back to her home dose.  Assessment and Plan:  Syncope, Unclear Etiology -Possibly contributed by polypharmacy -Hold off gabapentin and trazodone -The patient is also on Xanax as needed, restart at lowest dose, 0.25 mg twice daily as needed for anxiety and this was titrated up to 0.5 mg 3 times daily as needed -Obtain orthostatic vital signs and she was not orthostatic -Continue to monitor on telemetry -Echocardiogram done and showed an EF of 60 to 65% -Continue IV  fluid hydration with normal saline at 125 mL/h for 2 days and now will resume for another day or so given her elevated CK still -Obtain PT OT to further evaluate and treat and they are recommending home health   Sepsis secondary to suspected UTI , POA, -She met sepsis criteria as she had a leukocytosis and was tachypneic on admission -Urinalysis as below -Follow urine culture and peripheral blood cultures x 2; 2 out of the 4 cultures grew out staph species in 2/4 (one in each set), but WBC WNL  -Will continue IV ceftriaxone empirically that was started in the ED and defer holding vancomycin and repeat blood cultures x 2 -Will continue to monitor temperature and fever curve and WBCs -Maintain MAP greater than 65   Chronic Anxiety/Depression -Resume home as needed Xanax for anxiety at lowest dose of 0.25 mg p.o. twice daily as needed anxiety -Resume home buspirone 5 mg p.o. 3 times daily and sertraline 100 mg p.o. nightly -Patient became extremely anxious today and threatened to sign out AMA but then calmed down later and we will provide her some Vistaril 25 mg p.o. 3 times daily as needed   Hyperlipidemia -Continue to hold statin given her rhabdomyolysis and worsening CK   GERD/GI Prophylaxis -Resume home PPI with pantoprazole 40 mg p.o. daily  Rhabdomyolysis -CK Level Trend: 4631 -> 4485 and slightly went up to 4579 so we will continue IV fluids -Continue with IV fluid hydration and repeat CK in the morning -Currently getting  normal saline at 125 mL/h for 2 days and will need to be renewed in the morning -Urinalysis done and showed a turbid appearance with red color urine, small hemoglobin, 5 ketones, large leukocytes, positive nitrites, many bacteria, 6-10 RBCs per high-power field, greater than 50 WBCs with urine culture pending and blood cultures pending  Hypokalemia -Patient's K+ Level Trend: Recent Labs  Lab 07/13/23 2003 07/14/23 0429 07/15/23 0407  K 4.0 3.3* 4.1  -Replete  with po Kcl 40 mEQ BID -Continue to Monitor and Replete as Necessary -Repeat CMP in the AM   Abnormal LFTs, slightly worsening -Mild and AST is abnormal and ALT is normal -Likely in the setting of rhabdomyolysis given that she denies the use of alcohol -LFT Trend: Recent Labs  Lab 07/13/23 2003 07/14/23 0429 07/15/23 0407  AST 114* 126* 155*  ALT 25 26 35  -Will continue to monitor patient's hepatic function trend and if necessary we will obtain a right upper quadrant ultrasound and acute hepatitis panel if necessary if her AST is not improved further next few days  Hyperbilirubinemia  -Bilirubin Trend: Recent Labs  Lab 07/13/23 2003 07/14/23 0429 07/15/23 0407  BILITOT 2.7* 2.0* 0.9  -Continue to Monitor and Trend and Repeat CMP in the AM   Hypoalbuminemia -Patient's Albumin Trend: Recent Labs  Lab 07/13/23 2003 07/14/23 0429 07/15/23 0407  ALBUMIN 3.9 3.3* 3.3*  -Continue to Monitor and Trend and repeat CMP in the AM  Obesity -Complicates overall prognosis and care -Estimated body mass index is 32.75 kg/m as calculated from the following:   Height as of this encounter: 5\' 8"  (1.727 m).   Weight as of this encounter: 97.7 kg.  -Weight Loss and Dietary Counseling given   DVT prophylaxis: enoxaparin (LOVENOX) injection 40 mg Start: 07/14/23 1000    Code Status: Full Code Family Communication: Discussed with friends at bedside  Disposition Plan:  Level of care: Telemetry Status is: Inpatient Remains inpatient appropriate because: Needs continued IV fluid hydration and further improvement in her rhabdomyolysis prior to safe discharge disposition and will also need a workup blood cultures given that she had 2 out of the 4 (1 in each set) of Staphylococcus.   Consultants:  None  Procedures:  As delineated as above  Antimicrobials:  Anti-infectives (From admission, onward)    Start     Dose/Rate Route Frequency Ordered Stop   07/14/23 1000  cefTRIAXone  (ROCEPHIN) 2 g in sodium chloride 0.9 % 100 mL IVPB        2 g 200 mL/hr over 30 Minutes Intravenous Every 24 hours 07/13/23 2305     07/13/23 2230  cefTRIAXone (ROCEPHIN) 1 g in sodium chloride 0.9 % 100 mL IVPB        1 g 200 mL/hr over 30 Minutes Intravenous  Once 07/13/23 2224 07/14/23 0010       Subjective: Seen and examined at bedside she is extremely anxious and agitated and wanting to sign out AGAINST MEDICAL ADVICE.  Subsequently she calm down but she states she is feeling little bit better and denied any back pain today.  No nausea or vomiting.  No other concerns requested this time and was not as anxious by time I left the room  Objective: Vitals:   07/14/23 2027 07/15/23 0318 07/15/23 0615 07/15/23 1224  BP: 129/75 125/69  127/88  Pulse: 74 68  69  Resp: 20 20 17  (!) 22  Temp: 99.1 F (37.3 C) 97.7 F (36.5 C)  97.6 F (36.4  C)  TempSrc: Oral Oral  Oral  SpO2: 94% 91% 95% 95%  Weight:      Height:        Intake/Output Summary (Last 24 hours) at 07/15/2023 1452 Last data filed at 07/15/2023 1234 Gross per 24 hour  Intake 120 ml  Output 1400 ml  Net -1280 ml   Filed Weights   07/14/23 0653  Weight: 97.7 kg   Examination: Physical Exam:  Constitutional: WN/WD obese Caucasian female who is extremely anxious and agitated Respiratory: Diminished to auscultation bilaterally, no wheezing, rales, rhonchi or crackles. Normal respiratory effort and patient is not tachypenic. No accessory muscle use.  Unlabored breathing Cardiovascular: RRR, no murmurs / rubs / gallops. S1 and S2 auscultated.  No appreciable extremity edema Abdomen: Soft, non-tender, distended secondary to body habitus. Bowel sounds positive.  GU: Deferred. Musculoskeletal: No clubbing / cyanosis of digits/nails. No joint deformity upper and lower extremities.  Skin: No rashes, lesions, ulcers on limited skin evaluation. No induration; Warm and dry.  Neurologic: CN 2-12 grossly intact with no focal  deficits. Romberg sign and cerebellar reflexes not assessed.  Psychiatric: Normal judgment and insight. Alert and oriented x 3.  Extremely anxious and agitated mood this morning  Data Reviewed: I have personally reviewed following labs and imaging studies  CBC: Recent Labs  Lab 07/13/23 2003 07/14/23 0429 07/15/23 0407  WBC 15.5* 10.4 7.3  NEUTROABS  --   --  5.0  HGB 17.4* 15.6* 14.7  HCT 50.6* 46.4* 43.2  MCV 90.0 92.1 92.9  PLT 151 121* 115*   Basic Metabolic Panel: Recent Labs  Lab 07/13/23 2003 07/14/23 0429 07/15/23 0407  NA 139 136 138  K 4.0 3.3* 4.1  CL 103 102 106  CO2 23 23 23   GLUCOSE 110* 105* 103*  BUN 13 14 9   CREATININE 0.55 0.54 0.54  CALCIUM 9.7 8.6* 8.8*  MG  --  2.1 2.1  PHOS  --  3.0 3.1   GFR: Estimated Creatinine Clearance: 83.4 mL/min (by C-G formula based on SCr of 0.54 mg/dL). Liver Function Tests: Recent Labs  Lab 07/13/23 2003 07/14/23 0429 07/15/23 0407  AST 114* 126* 155*  ALT 25 26 35  ALKPHOS 99 84 75  BILITOT 2.7* 2.0* 0.9  PROT 7.3 6.0* 5.9*  ALBUMIN 3.9 3.3* 3.3*   Recent Labs  Lab 07/13/23 2003  LIPASE 23   No results for input(s): "AMMONIA" in the last 168 hours. Coagulation Profile: No results for input(s): "INR", "PROTIME" in the last 168 hours. Cardiac Enzymes: Recent Labs  Lab 07/13/23 2003 07/14/23 0429 07/15/23 0407  CKTOTAL 4,631* 4,485* 4,579*   BNP (last 3 results) No results for input(s): "PROBNP" in the last 8760 hours. HbA1C: No results for input(s): "HGBA1C" in the last 72 hours. CBG: Recent Labs  Lab 07/13/23 1840  GLUCAP 122*   Lipid Profile: No results for input(s): "CHOL", "HDL", "LDLCALC", "TRIG", "CHOLHDL", "LDLDIRECT" in the last 72 hours. Thyroid Function Tests: No results for input(s): "TSH", "T4TOTAL", "FREET4", "T3FREE", "THYROIDAB" in the last 72 hours. Anemia Panel: No results for input(s): "VITAMINB12", "FOLATE", "FERRITIN", "TIBC", "IRON", "RETICCTPCT" in the last 72  hours. Sepsis Labs: No results for input(s): "PROCALCITON", "LATICACIDVEN" in the last 168 hours.  Recent Results (from the past 240 hour(s))  Culture, blood (Routine X 2) w Reflex to ID Panel     Status: None (Preliminary result)   Collection Time: 07/14/23  4:20 AM   Specimen: BLOOD RIGHT HAND  Result Value Ref Range  Status   Specimen Description   Final    BLOOD RIGHT HAND Performed at [facility], [address] [place], [place] [address]    Special Requests   Final    BOTTLES DRAWN AEROBIC AND ANAEROBIC Blood Culture adequate volume Performed at [facility], [address] [place], [place] [address]    Culture  Setup Time   Final    GRAM POSITIVE COCCI IN CLUSTERS AEROBIC BOTTLE ONLY CRITICAL RESULT CALLED TO, READ BACK BY AND VERIFIED WITH: PHARMD C. SHADE 07/15/23 @ 0408 BY AB Performed at [facility], [address], [place], [place] [address]    Culture GRAM POSITIVE COCCI  Final   Report Status PENDING  Incomplete  Blood Culture ID Panel (Reflexed)     Status: Abnormal   Collection Time: 07/14/23  4:20 AM  Result Value Ref Range Status   Enterococcus faecalis NOT DETECTED NOT DETECTED Final   Enterococcus Faecium NOT DETECTED NOT DETECTED Final   Listeria monocytogenes NOT DETECTED NOT DETECTED Final   Staphylococcus species DETECTED (A) NOT DETECTED Final    Comment: CRITICAL RESULT CALLED TO, READ BACK BY AND VERIFIED WITH: PHARMD C. SHADE 07/15/23 @ 0408 BY AB    Staphylococcus aureus (BCID) NOT DETECTED NOT DETECTED Final   Staphylococcus epidermidis NOT DETECTED NOT DETECTED Final   Staphylococcus lugdunensis NOT DETECTED NOT DETECTED Final   Streptococcus species NOT DETECTED NOT DETECTED Final   Streptococcus agalactiae NOT DETECTED NOT DETECTED Final   Streptococcus pneumoniae NOT DETECTED NOT DETECTED Final   Streptococcus pyogenes NOT DETECTED NOT DETECTED Final   A.calcoaceticus-baumannii NOT DETECTED NOT  DETECTED Final   Bacteroides fragilis NOT DETECTED NOT DETECTED Final   Enterobacterales NOT DETECTED NOT DETECTED Final   Enterobacter cloacae complex NOT DETECTED NOT DETECTED Final   Escherichia coli NOT DETECTED NOT DETECTED Final   Klebsiella aerogenes NOT DETECTED NOT DETECTED Final   Klebsiella oxytoca NOT DETECTED NOT DETECTED Final   Klebsiella pneumoniae NOT DETECTED NOT DETECTED Final   Proteus species NOT DETECTED NOT DETECTED Final   Salmonella species NOT DETECTED NOT DETECTED Final   Serratia marcescens NOT DETECTED NOT DETECTED Final   Haemophilus influenzae NOT DETECTED NOT DETECTED Final   Neisseria meningitidis NOT DETECTED NOT DETECTED Final   Pseudomonas aeruginosa NOT DETECTED NOT DETECTED Final   Stenotrophomonas maltophilia NOT DETECTED NOT DETECTED Final   Candida albicans NOT DETECTED NOT DETECTED Final   Candida auris NOT DETECTED NOT DETECTED Final   Candida glabrata NOT DETECTED NOT DETECTED Final   Candida krusei NOT DETECTED NOT DETECTED Final   Candida parapsilosis NOT DETECTED NOT DETECTED Final   Candida tropicalis NOT DETECTED NOT DETECTED Final   Cryptococcus neoformans/gattii NOT DETECTED NOT DETECTED Final    Comment: Performed at [facility], [address] [place], [place] [address]  Culture, blood (Routine X 2) w Reflex to ID Panel     Status: None (Preliminary result)   Collection Time: 07/14/23  4:29 AM   Specimen: BLOOD RIGHT ARM  Result Value Ref Range Status   Specimen Description   Final    BLOOD RIGHT ARM Performed at [facility], [address], [place], [place] [address]    Special Requests   Final    BOTTLES DRAWN AEROBIC AND ANAEROBIC Blood Culture adequate volume Performed at [facility], [address] [place], [place] [address]    Culture  Setup Time   Final  GRAM POSITIVE COCCI IN CLUSTERS AEROBIC BOTTLE ONLY CRITICAL VALUE NOTED.  VALUE IS CONSISTENT WITH PREVIOUSLY  REPORTED AND CALLED VALUE. Performed at [facility], [address] [place], [place] [address]    Culture [facility] POSITIVE COCCI  Final   Report Status PENDING  Incomplete    Radiology Studies: ECHOCARDIOGRAM COMPLETE  Result Date: 07/14/2023    ECHOCARDIOGRAM REPORT   Patient Name:   MERLEE KELLMAN [facility] Date of Exam: 07/14/2023 Medical Rec #:  [id number]             Height:       68.0 in Accession #:    [id number]            Weight:       215.4 lb Date of Birth:  06-19-1956            BSA:          2.109 m Patient Age:    66 years              BP:           125/75 mmHg Patient Gender: F                     HR:           79 bpm. Exam Location:  Inpatient Procedure: 2D Echo, Cardiac Doppler and Color Doppler Indications:    Syncope  History:        Patient has prior history of Echocardiogram examinations, most                 recent 12/02/2020. COPD; Risk Factors:Sleep Apnea, Obesity and                 Former Smoker. Oxygen therapy at home.  Sonographer:    Milda Smart Referring Phys: [id number] Darlin Drop  Sonographer Comments: Patient is obese. Image acquisition challenging due to patient body habitus and Image acquisition challenging due to respiratory motion. IMPRESSIONS  1. Left ventricular ejection fraction, by estimation, is 60 to 65%. Left ventricular ejection fraction by 2D MOD biplane is 64.7 %. The left ventricle has normal function. The left ventricle has no regional wall motion abnormalities. Left ventricular diastolic parameters are consistent with Grade I diastolic dysfunction (impaired relaxation). Elevated left ventricular end-diastolic pressure. The E/e' is 16.  2. Right ventricular systolic function is low normal. The right ventricular size is mildly enlarged. Tricuspid regurgitation signal is inadequate for assessing PA pressure.  3. Right atrial size was mildly dilated.  4. The mitral valve was not well visualized. Mild mitral valve regurgitation. No evidence of mitral stenosis.   5. The aortic valve was not well visualized. Aortic valve regurgitation is not visualized. No aortic stenosis is present.  6. Aortic dilatation noted. There is borderline dilatation of the aortic root, measuring 38 mm. There is borderline dilatation of the ascending aorta, measuring 39 mm.  7. The inferior vena cava is dilated in size with >50% respiratory variability, suggesting right atrial pressure of 8 mmHg. Comparison(s): Changes from prior study are noted. 12/02/2020: LVEF 55-60%, normal RV systolic function. FINDINGS  Left Ventricle: Left ventricular ejection fraction, by estimation, is 60 to 65%. Left ventricular ejection fraction by 2D MOD biplane is 64.7 %. The left ventricle has normal function. The left ventricle has no regional wall motion abnormalities. The left ventricular internal cavity size was normal in size. There is no left ventricular hypertrophy. Left ventricular diastolic parameters are  consistent with Grade I diastolic dysfunction (impaired relaxation). Elevated left ventricular end-diastolic pressure. The E/e' is 16. Right Ventricle: The right ventricular size is mildly enlarged. No increase in right ventricular wall thickness. Right ventricular systolic function is low normal. Tricuspid regurgitation signal is inadequate for assessing PA pressure. Left Atrium: Left atrial size was normal in size. Right Atrium: Right atrial size was mildly dilated. Pericardium: There is no evidence of pericardial effusion. Mitral Valve: The mitral valve was not well visualized. Mild mitral valve regurgitation. No evidence of mitral valve stenosis. MV peak gradient, 4.2 mmHg. The mean mitral valve gradient is 2.0 mmHg. Tricuspid Valve: The tricuspid valve is not well visualized. Tricuspid valve regurgitation is not demonstrated. No evidence of tricuspid stenosis. Aortic Valve: The aortic valve was not well visualized. Aortic valve regurgitation is not visualized. No aortic stenosis is present. Pulmonic Valve:  The pulmonic valve was not well visualized. Pulmonic valve regurgitation is not visualized. No evidence of pulmonic stenosis. Aorta: Aortic dilatation noted. There is borderline dilatation of the aortic root, measuring 38 mm. There is borderline dilatation of the ascending aorta, measuring 39 mm. Venous: The inferior vena cava is dilated in size with greater than 50% respiratory variability, suggesting right atrial pressure of 8 mmHg. IAS/Shunts: No atrial level shunt detected by color flow Doppler.  LEFT VENTRICLE PLAX 2D                        Biplane EF (MOD) LVIDd:         5.30 cm         LV Biplane EF:   Left LVIDs:         4.30 cm                          ventricular LV PW:         1.10 cm                          ejection LV IVS:        0.90 cm                          fraction by LVOT diam:     2.10 cm                          2D MOD LV SV:         89                               biplane is LV SV Index:   42                               64.7 %. LVOT Area:     3.46 cm                                Diastology                                LV e' medial:    3.48 cm/s LV Volumes (MOD)  LV E/e' medial:  19.9 LV vol d, MOD    83.9 ml       LV e' lateral:   5.66 cm/s A2C:                           LV E/e' lateral: 12.3 LV vol d, MOD    78.6 ml A4C: LV vol s, MOD    31.7 ml A2C: LV vol s, MOD    27.8 ml A4C: LV SV MOD A2C:   52.2 ml LV SV MOD A4C:   78.6 ml LV SV MOD BP:    55.9 ml RIGHT VENTRICLE RV Basal diam:  4.00 cm RV Mid diam:    3.30 cm RV S prime:     9.90 cm/s TAPSE (M-mode): 1.4 cm LEFT ATRIUM             Index        RIGHT ATRIUM           Index LA diam:        3.20 cm 1.52 cm/m   RA Area:     12.90 cm LA Vol (A2C):   39.2 ml 18.56 ml/m  RA Volume:   30.60 ml  14.51 ml/m LA Vol (A4C):   22.4 ml 10.62 ml/m LA Biplane Vol: 33.7 ml 15.98 ml/m  AORTIC VALVE LVOT Vmax:   114.00 cm/s LVOT Vmean:  77.800 cm/s LVOT VTI:    0.256 m  AORTA Ao Root diam: 3.80 cm Ao Asc diam:  3.90 cm MITRAL  VALVE MV Area (PHT): 2.48 cm    SHUNTS MV Area VTI:   3.46 cm    Systemic VTI:  0.26 m MV Peak grad:  4.2 mmHg    Systemic Diam: 2.10 cm MV Mean grad:  2.0 mmHg MV Vmax:       1.03 m/s MV Vmean:      61.8 cm/s MV Decel Time: 306 msec MR Peak grad: 22.5 mmHg MR Vmax:      237.00 cm/s MV E velocity: 69.40 cm/s MV A velocity: 89.10 cm/s MV E/A ratio:  0.78 Zoila Shutter MD Electronically signed by Zoila Shutter MD Signature Date/Time: 07/14/2023/2:04:48 PM    Final    CT Head Wo Contrast  Result Date: 07/13/2023 CLINICAL DATA:  Brought in due to fall in bathtub, family believe patient bathtub for 24 hours. Patient has no memory of the event. Patient not oriented to time EXAM: CT HEAD WITHOUT CONTRAST TECHNIQUE: Contiguous axial images were obtained from the base of the skull through the vertex without intravenous contrast. RADIATION DOSE REDUCTION: This exam was performed according to the departmental dose-optimization program which includes automated exposure control, adjustment of the mA and/or kV according to patient size and/or use of iterative reconstruction technique. COMPARISON:  CT head 04/21/2023 FINDINGS: Brain: No intracranial hemorrhage, mass effect, or evidence of acute infarct. No hydrocephalus. No extra-axial fluid collection. Vascular: No hyperdense vessel or unexpected calcification. Skull: No fracture or focal lesion. Sinuses/Orbits: No acute finding. Other: None. IMPRESSION: No acute intracranial abnormality. Electronically Signed   By: Minerva Fester M.D.   On: 07/13/2023 20:57   DG Chest 2 View  Result Date: 07/13/2023 CLINICAL DATA:  Fall in bathtub. Family believe patient was in bathroom for 24 hours. No memory of the event. Abrasion to right arm. EXAM: CHEST - 2 VIEW COMPARISON:  Radiograph 04/20/2023 FINDINGS: Stable cardiomediastinal silhouette. Aortic atherosclerotic calcification. Hyperinflation and chronic bronchitic change. No focal  consolidation, pleural effusion, or pneumothorax.  No displaced rib fractures. IMPRESSION: No acute cardiopulmonary disease. Electronically Signed   By: Minerva Fester M.D.   On: 07/13/2023 20:33     Scheduled Meds:  busPIRone  5 mg Oral TID   enoxaparin (LOVENOX) injection  40 mg Subcutaneous Q24H   pantoprazole  40 mg Oral Daily   sertraline  100 mg Oral QHS   Continuous Infusions:  sodium chloride     cefTRIAXone (ROCEPHIN)  IV 2 g (07/15/23 1130)    LOS: 2 days   Marguerita Merles, DO [facility] Available via Epic secure chat 7am-7pm After these hours, please refer to coverage provider listed on [email or web address] 07/15/2023, 2:52 PM

## 2023-07-16 DIAGNOSIS — R55 Syncope and collapse: Secondary | ICD-10-CM | POA: Diagnosis not present

## 2023-07-16 DIAGNOSIS — A419 Sepsis, unspecified organism: Secondary | ICD-10-CM | POA: Diagnosis not present

## 2023-07-16 DIAGNOSIS — N390 Urinary tract infection, site not specified: Secondary | ICD-10-CM | POA: Diagnosis not present

## 2023-07-16 DIAGNOSIS — Z79899 Other long term (current) drug therapy: Secondary | ICD-10-CM

## 2023-07-16 LAB — CBC WITH DIFFERENTIAL/PLATELET
Abs Immature Granulocytes: 0.02 10*3/uL (ref 0.00–0.07)
Basophils Absolute: 0 10*3/uL (ref 0.0–0.1)
Basophils Relative: 1 %
Eosinophils Absolute: 0.1 10*3/uL (ref 0.0–0.5)
Eosinophils Relative: 1 %
HCT: 41.3 % (ref 36.0–46.0)
Hemoglobin: 13.4 g/dL (ref 12.0–15.0)
Immature Granulocytes: 0 %
Lymphocytes Relative: 22 %
Lymphs Abs: 1.1 10*3/uL (ref 0.7–4.0)
MCH: 30.8 pg (ref 26.0–34.0)
MCHC: 32.4 g/dL (ref 30.0–36.0)
MCV: 94.9 fL (ref 80.0–100.0)
Monocytes Absolute: 0.5 10*3/uL (ref 0.1–1.0)
Monocytes Relative: 10 %
Neutro Abs: 3.3 10*3/uL (ref 1.7–7.7)
Neutrophils Relative %: 66 %
Platelets: 103 10*3/uL — ABNORMAL LOW (ref 150–400)
RBC: 4.35 MIL/uL (ref 3.87–5.11)
RDW: 13.7 % (ref 11.5–15.5)
WBC: 5 10*3/uL (ref 4.0–10.5)
nRBC: 0 % (ref 0.0–0.2)

## 2023-07-16 LAB — COMPREHENSIVE METABOLIC PANEL
ALT: 34 U/L (ref 0–44)
AST: 102 U/L — ABNORMAL HIGH (ref 15–41)
Albumin: 3 g/dL — ABNORMAL LOW (ref 3.5–5.0)
Alkaline Phosphatase: 67 U/L (ref 38–126)
Anion gap: 9 (ref 5–15)
BUN: 6 mg/dL — ABNORMAL LOW (ref 8–23)
CO2: 27 mmol/L (ref 22–32)
Calcium: 8.5 mg/dL — ABNORMAL LOW (ref 8.9–10.3)
Chloride: 105 mmol/L (ref 98–111)
Creatinine, Ser: 0.52 mg/dL (ref 0.44–1.00)
GFR, Estimated: >60 mL/min (ref >60)
Glucose, Bld: 95 mg/dL (ref 70–99)
Potassium: 4.3 mmol/L (ref 3.5–5.1)
Sodium: 141 mmol/L (ref 135–145)
Total Bilirubin: 0.9 mg/dL (ref 0.3–1.2)
Total Protein: 5.4 g/dL — ABNORMAL LOW (ref 6.5–8.1)

## 2023-07-16 LAB — CULTURE, BLOOD (ROUTINE X 2)
Special Requests: ADEQUATE
Special Requests: ADEQUATE

## 2023-07-16 LAB — CK: Total CK: 2458 U/L — ABNORMAL HIGH (ref 38–234)

## 2023-07-16 LAB — MAGNESIUM: Magnesium: 2.1 mg/dL (ref 1.7–2.4)

## 2023-07-16 LAB — PHOSPHORUS: Phosphorus: 3.6 mg/dL (ref 2.5–4.6)

## 2023-07-16 MED ORDER — BUSPIRONE HCL 5 MG PO TABS
7.5000 mg | ORAL_TABLET | Freq: Three times a day (TID) | ORAL | Status: DC
  Administered 2023-07-16 – 2023-07-17 (×3): 7.5 mg via ORAL
  Filled 2023-07-16 (×3): qty 2

## 2023-07-16 NOTE — Progress Notes (Signed)
PROGRESS NOTE  [name] [name] UYQ:[id number] DOB: 1955-11-28   PCP: Rudene Christians, DO  Patient is from: Home.  Lives alone.  DOA: 07/13/2023 LOS: 3  Chief complaints Chief Complaint  Patient presents with   Fall   Altered Mental Status     Brief Narrative / Interim history: 67 year old F with PMH of HTN, HLD, anxiety, depression and obesity brought to ED by EMS after "passing out in her bathroom".  Patient states feeling anxious and taking 1 tablet of Xanax before she went into her bathtub about 5 PM and woke up in bathtub the next day about 3 PM and called out for help, and her landlord called EMS.  She was admitted with working diagnosis of syncope, rhabdomyolysis and sepsis due to suspected UTI.  Started on IV fluid and IV antibiotics.  TTE without significant finding.    Subjective: Seen and examined earlier this morning.  No major events overnight of this morning.  Feels well and better today.  Some pain in right shoulder that she attributes to pulling herself out of bed.  No other complaints.  Denies chest pain, dyspnea, palpitation, GI or UTI symptoms.  She is eager to go home but understands the need to stay in the hospital until she is medically stable for discharge  Objective: Vitals:   07/15/23 1224 07/15/23 2044 07/16/23 0441 07/16/23 0519  BP: 127/88 133/77 136/68 118/77  Pulse: 69 64 65 (!) 57  Resp: (!) 22 20  20   Temp: 97.6 F (36.4 C) 98.7 F (37.1 C) 98.1 F (36.7 C) 98.5 F (36.9 C)  TempSrc: Oral Oral Oral Oral  SpO2: 95% 93% 94% 92%  Weight:      Height:        Examination:  GENERAL: No apparent distress.  Nontoxic. HEENT: MMM.  Vision and hearing grossly intact.  NECK: Supple.  No apparent JVD.  RESP:  No IWOB.  Fair aeration bilaterally. CVS:  RRR. Heart sounds normal.  ABD/GI/GU: BS+. Abd soft, NTND.  MSK/EXT:  Moves extremities. No apparent deformity. No edema.  SKIN: no apparent skin lesion or wound NEURO: Awake, alert and oriented  appropriately.  No apparent focal neuro deficit. PSYCH: Calm. Normal affect.   Procedures:  None  Microbiology summarized: 9/6-blood culture with Staph hominis in 1 out of 4 bottles likely contaminant 9/5-urine culture pending 9/7-repeat blood culture NGTD  Assessment and plan: Principal Problem:   Syncope  Polypharmacy versus syncope: Patient with history of anxiety.  Reports taking 1 mg Xanax 2-3 times as needed.  She is also on trazodone and low-dose gabapentin.  She is not compliant with Zoloft.  TTE without significant finding.  EKG and telemetry without significant finding.  Infectious workup concerning for UTI although she denies symptoms. -Extensive discussion about polypharmacy.  She agrees with reduced dose of Xanax -Increase BuSpar to 7.5 mg 3 times daily -Zoloft already resumed at 100 mg daily.  Supposed to be on 300 mg daily but not compliant.  She has not taken this in a while. -Discontinue Atarax   Sepsis secondary to suspected UTI: POA.  Had leukocytosis and tachypnea on admission.  UA concerning for UTI. -Continue ceftriaxone pending urine culture   Anxiety and depression: Anxiety not well-controlled. -Adjusted meds as above  Abnormal blood culture: Blood culture 9/6 with Staph hominis in 1 out of 4 bottles likely contaminant.  Repeat blood culture negative.  Bacteremia ruled out  Traumatic rhabdomyolysis: Improving. -Continue IV fluids -Continue trending CK  Elevated LFT/hyperbilirubinemia: Likely  due to rhabdomyolysis.  Improved -Continue monitoring   Hyperlipidemia -Continue to hold statin given her rhabdomyolysis and worsening CK  Hypokalemia -Monitor replenish as appropriate   GERD/GI Prophylaxis -Continue PPI  Fall at home/generalized weakness -PT/OT-recommended home health on discharge.  Obesity Body mass index is 32.75 kg/m. -On Ozempic at home. -Encourage lifestyle change to lose weight          DVT prophylaxis:  enoxaparin  (LOVENOX) injection 40 mg Start: 07/14/23 1000  Code Status: Full code Family Communication: None at bedside Level of care: Telemetry Status is: Inpatient Remains inpatient appropriate because: Sepsis due to UTI, rhabdomyolysis   Final disposition: Home with home health Consultants:  None  55 minutes with more than 50% spent in reviewing records, counseling patient/family and coordinating care.   Sch Meds:  Scheduled Meds:  busPIRone  5 mg Oral TID   enoxaparin (LOVENOX) injection  40 mg Subcutaneous Q24H   pantoprazole  40 mg Oral Daily   sertraline  100 mg Oral QHS   Continuous Infusions:  sodium chloride 125 mL/hr at 07/16/23 0023   cefTRIAXone (ROCEPHIN)  IV 2 g (07/16/23 0924)   PRN Meds:.acetaminophen, albuterol, ALPRAZolam, hydrOXYzine, melatonin, mouth rinse, polyethylene glycol, prochlorperazine, traZODone  Antimicrobials: Anti-infectives (From admission, onward)    Start     Dose/Rate Route Frequency Ordered Stop   07/14/23 1000  cefTRIAXone (ROCEPHIN) 2 g in sodium chloride 0.9 % 100 mL IVPB        2 g 200 mL/hr over 30 Minutes Intravenous Every 24 hours 07/13/23 2305     07/13/23 2230  cefTRIAXone (ROCEPHIN) 1 g in sodium chloride 0.9 % 100 mL IVPB        1 g 200 mL/hr over 30 Minutes Intravenous  Once 07/13/23 2224 07/14/23 0010        I have personally reviewed the following labs and images: CBC: Recent Labs  Lab 07/13/23 2003 07/14/23 0429 07/15/23 0407 07/16/23 0752  WBC 15.5* 10.4 7.3 5.0  NEUTROABS  --   --  5.0 3.3  HGB 17.4* 15.6* 14.7 13.4  HCT 50.6* 46.4* 43.2 41.3  MCV 90.0 92.1 92.9 94.9  PLT 151 121* 115* 103*   BMP &GFR Recent Labs  Lab 07/13/23 2003 07/14/23 0429 07/15/23 0407 07/16/23 0752  NA 139 136 138 141  K 4.0 3.3* 4.1 4.3  CL 103 102 106 105  CO2 23 23 23 27   GLUCOSE 110* 105* 103* 95  BUN 13 14 9  6*  CREATININE 0.55 0.54 0.54 0.52  CALCIUM 9.7 8.6* 8.8* 8.5*  MG  --  2.1 2.1 2.1  PHOS  --  3.0 3.1 3.6    Estimated Creatinine Clearance: 83.4 mL/min (by C-G formula based on SCr of 0.52 mg/dL). Liver & Pancreas: Recent Labs  Lab 07/13/23 2003 07/14/23 0429 07/15/23 0407 07/16/23 0752  AST 114* 126* 155* 102*  ALT 25 26 35 34  ALKPHOS 99 84 75 67  BILITOT 2.7* 2.0* 0.9 0.9  PROT 7.3 6.0* 5.9* 5.4*  ALBUMIN 3.9 3.3* 3.3* 3.0*   Recent Labs  Lab 07/13/23 2003  LIPASE 23   No results for input(s): "AMMONIA" in the last 168 hours. Diabetic: No results for input(s): "HGBA1C" in the last 72 hours. Recent Labs  Lab 07/13/23 1840  GLUCAP 122*   Cardiac Enzymes: Recent Labs  Lab 07/13/23 2003 07/14/23 0429 07/15/23 0407 07/16/23 0752  CKTOTAL 4,631* 4,485* 4,579* 2,458*   No results for input(s): "PROBNP" in the last 8760 hours.  Coagulation Profile: No results for input(s): "INR", "PROTIME" in the last 168 hours. Thyroid Function Tests: No results for input(s): "TSH", "T4TOTAL", "FREET4", "T3FREE", "THYROIDAB" in the last 72 hours. Lipid Profile: No results for input(s): "CHOL", "HDL", "LDLCALC", "TRIG", "CHOLHDL", "LDLDIRECT" in the last 72 hours. Anemia Panel: No results for input(s): "VITAMINB12", "FOLATE", "FERRITIN", "TIBC", "IRON", "RETICCTPCT" in the last 72 hours. Urine analysis:    Component Value Date/Time   COLORURINE RED (A) 07/13/2023 2043   APPEARANCEUR TURBID (A) 07/13/2023 2043   APPEARANCEUR Cloudy (A) 04/13/2023 1500   LABSPEC 1.025 07/13/2023 2043   PHURINE 5.0 07/13/2023 2043   GLUCOSEU NEGATIVE 07/13/2023 2043   HGBUR SMALL (A) 07/13/2023 2043   BILIRUBINUR NEGATIVE 07/13/2023 2043   BILIRUBINUR Negative 04/13/2023 1500   KETONESUR 5 (A) 07/13/2023 2043   PROTEINUR 30 (A) 07/13/2023 2043   UROBILINOGEN 1.0 07/28/2014 1850   NITRITE POSITIVE (A) 07/13/2023 2043   LEUKOCYTESUR LARGE (A) 07/13/2023 2043   Sepsis Labs: Invalid input(s): "PROCALCITONIN", "LACTICIDVEN"  Microbiology: Recent Results (from the past 240 hour(s))  Culture, blood  (Routine X 2) w Reflex to ID Panel     Status: Abnormal   Collection Time: 07/14/23  4:20 AM   Specimen: BLOOD RIGHT HAND  Result Value Ref Range Status   Specimen Description   Final    BLOOD RIGHT HAND Performed at [facility], [address], [place], [place] [address]    Special Requests   Final    BOTTLES DRAWN AEROBIC AND ANAEROBIC Blood Culture adequate volume Performed at [facility], [address], [place], [place] [address]    Culture  Setup Time   Final    GRAM POSITIVE COCCI IN CLUSTERS AEROBIC BOTTLE ONLY CRITICAL RESULT CALLED TO, READ BACK BY AND VERIFIED WITH: PHARMD C. SHADE 07/15/23 @ 0408 BY AB    Culture (A)  Final    STAPHYLOCOCCUS HOMINIS THE SIGNIFICANCE OF ISOLATING THIS ORGANISM FROM A SINGLE SET OF BLOOD CULTURES WHEN MULTIPLE SETS ARE DRAWN IS UNCERTAIN. PLEASE NOTIFY THE MICROBIOLOGY DEPARTMENT WITHIN ONE WEEK IF SPECIATION AND SENSITIVITIES ARE REQUIRED. Performed at [facility], [address] [place], [place] [address]    Report Status 07/16/2023 FINAL  Final  Blood Culture ID Panel (Reflexed)     Status: Abnormal   Collection Time: 07/14/23  4:20 AM  Result Value Ref Range Status   Enterococcus faecalis NOT DETECTED NOT DETECTED Final   Enterococcus Faecium NOT DETECTED NOT DETECTED Final   Listeria monocytogenes NOT DETECTED NOT DETECTED Final   Staphylococcus species DETECTED (A) NOT DETECTED Final    Comment: CRITICAL RESULT CALLED TO, READ BACK BY AND VERIFIED WITH: PHARMD C. SHADE 07/15/23 @ 0408 BY AB    Staphylococcus aureus (BCID) NOT DETECTED NOT DETECTED Final   Staphylococcus epidermidis NOT DETECTED NOT DETECTED Final   Staphylococcus lugdunensis NOT DETECTED NOT DETECTED Final   Streptococcus species NOT DETECTED NOT DETECTED Final   Streptococcus agalactiae NOT DETECTED NOT DETECTED Final   Streptococcus pneumoniae NOT DETECTED NOT DETECTED Final   Streptococcus pyogenes NOT DETECTED NOT  DETECTED Final   A.calcoaceticus-baumannii NOT DETECTED NOT DETECTED Final   Bacteroides fragilis NOT DETECTED NOT DETECTED Final   Enterobacterales NOT DETECTED NOT DETECTED Final   Enterobacter cloacae complex NOT DETECTED NOT DETECTED Final   Escherichia coli NOT DETECTED NOT DETECTED Final   Klebsiella aerogenes NOT DETECTED NOT DETECTED Final   Klebsiella oxytoca NOT DETECTED NOT DETECTED Final   Klebsiella  pneumoniae NOT DETECTED NOT DETECTED Final   Proteus species NOT DETECTED NOT DETECTED Final   Salmonella species NOT DETECTED NOT DETECTED Final   Serratia marcescens NOT DETECTED NOT DETECTED Final   Haemophilus influenzae NOT DETECTED NOT DETECTED Final   Neisseria meningitidis NOT DETECTED NOT DETECTED Final   Pseudomonas aeruginosa NOT DETECTED NOT DETECTED Final   Stenotrophomonas maltophilia NOT DETECTED NOT DETECTED Final   Candida albicans NOT DETECTED NOT DETECTED Final   Candida auris NOT DETECTED NOT DETECTED Final   Candida glabrata NOT DETECTED NOT DETECTED Final   Candida krusei NOT DETECTED NOT DETECTED Final   Candida parapsilosis NOT DETECTED NOT DETECTED Final   Candida tropicalis NOT DETECTED NOT DETECTED Final   Cryptococcus neoformans/gattii NOT DETECTED NOT DETECTED Final    Comment: Performed at [facility], [address], [place], [place] [address]  Culture, blood (Routine X 2) w Reflex to ID Panel     Status: Abnormal   Collection Time: 07/14/23  4:29 AM   Specimen: BLOOD RIGHT ARM  Result Value Ref Range Status   Specimen Description   Final    BLOOD RIGHT ARM Performed at [facility], [address] [place], [place] [address]    Special Requests   Final    BOTTLES DRAWN AEROBIC AND ANAEROBIC Blood Culture adequate volume Performed at [facility], [address], [place], [place] [address]    Culture  Setup Time   Final    GRAM POSITIVE COCCI IN CLUSTERS AEROBIC BOTTLE ONLY CRITICAL VALUE NOTED.   VALUE IS CONSISTENT WITH PREVIOUSLY REPORTED AND CALLED VALUE.    Culture (A)  Final    STAPHYLOCOCCUS HAEMOLYTICUS THE SIGNIFICANCE OF ISOLATING THIS ORGANISM FROM A SINGLE SET OF BLOOD CULTURES WHEN MULTIPLE SETS ARE DRAWN IS UNCERTAIN. PLEASE NOTIFY THE MICROBIOLOGY DEPARTMENT WITHIN ONE WEEK IF SPECIATION AND SENSITIVITIES ARE REQUIRED. Performed at [facility], [address] [place], [place] [address]    Report Status 07/16/2023 FINAL  Final  Culture, blood (Routine X 2) w Reflex to ID Panel     Status: None (Preliminary result)   Collection Time: 07/15/23  2:30 PM   Specimen: BLOOD RIGHT HAND  Result Value Ref Range Status   Specimen Description   Final    BLOOD RIGHT HAND Performed at [facility], [address] [place], [place] [address]    Special Requests   Final    BOTTLES DRAWN AEROBIC ONLY Blood Culture results may not be optimal due to an inadequate volume of blood received in culture bottles Performed at [facility], [address] [place], [place] [address]    Culture   Final    NO GROWTH < 24 HOURS Performed at [facility], [address], [place], [place] [address]    Report Status PENDING  Incomplete  Culture, blood (Routine X 2) w Reflex to ID Panel     Status: None (Preliminary result)   Collection Time: 07/15/23  2:30 PM   Specimen: BLOOD RIGHT HAND  Result Value Ref Range Status   Specimen Description   Final    BLOOD RIGHT HAND Performed at [facility], [address], [place], [place] [address]    Special Requests   Final    BOTTLES DRAWN AEROBIC ONLY Blood Culture adequate volume Performed at [facility], [address], [place], [place] [address]    Culture   Final  NO GROWTH < 24 HOURS Performed at [facility], [address], [place], [place] [address]    Report Status PENDING  Incomplete    Radiology Studies: No results found.    Anicka Stuckert T. Sawsan Riggio [facility]  If 7PM-7AM, please contact night-coverage [email or web address] 07/16/2023, 12:09 PM

## 2023-07-17 DIAGNOSIS — N390 Urinary tract infection, site not specified: Secondary | ICD-10-CM | POA: Insufficient documentation

## 2023-07-17 DIAGNOSIS — F411 Generalized anxiety disorder: Secondary | ICD-10-CM | POA: Insufficient documentation

## 2023-07-17 DIAGNOSIS — A4150 Gram-negative sepsis, unspecified: Secondary | ICD-10-CM | POA: Insufficient documentation

## 2023-07-17 DIAGNOSIS — Z79899 Other long term (current) drug therapy: Secondary | ICD-10-CM

## 2023-07-17 DIAGNOSIS — T796XXA Traumatic ischemia of muscle, initial encounter: Secondary | ICD-10-CM | POA: Insufficient documentation

## 2023-07-17 DIAGNOSIS — R748 Abnormal levels of other serum enzymes: Secondary | ICD-10-CM | POA: Insufficient documentation

## 2023-07-17 DIAGNOSIS — R55 Syncope and collapse: Secondary | ICD-10-CM | POA: Diagnosis not present

## 2023-07-17 HISTORY — DX: Gram-negative sepsis, unspecified: N39.0

## 2023-07-17 LAB — CK: Total CK: 1130 U/L — ABNORMAL HIGH (ref 38–234)

## 2023-07-17 LAB — COMPREHENSIVE METABOLIC PANEL
ALT: 31 U/L (ref 0–44)
AST: 73 U/L — ABNORMAL HIGH (ref 15–41)
Albumin: 3.2 g/dL — ABNORMAL LOW (ref 3.5–5.0)
Alkaline Phosphatase: 71 U/L (ref 38–126)
Anion gap: 8 (ref 5–15)
BUN: 6 mg/dL — ABNORMAL LOW (ref 8–23)
CO2: 26 mmol/L (ref 22–32)
Calcium: 8.6 mg/dL — ABNORMAL LOW (ref 8.9–10.3)
Chloride: 103 mmol/L (ref 98–111)
Creatinine, Ser: 0.46 mg/dL (ref 0.44–1.00)
GFR, Estimated: >60 mL/min (ref >60)
Glucose, Bld: 102 mg/dL — ABNORMAL HIGH (ref 70–99)
Potassium: 3.5 mmol/L (ref 3.5–5.1)
Sodium: 137 mmol/L (ref 135–145)
Total Bilirubin: 0.9 mg/dL (ref 0.3–1.2)
Total Protein: 6 g/dL — ABNORMAL LOW (ref 6.5–8.1)

## 2023-07-17 LAB — CBC
HCT: 42.8 % (ref 36.0–46.0)
Hemoglobin: 14.2 g/dL (ref 12.0–15.0)
MCH: 31.3 pg (ref 26.0–34.0)
MCHC: 33.2 g/dL (ref 30.0–36.0)
MCV: 94.3 fL (ref 80.0–100.0)
Platelets: 123 10*3/uL — ABNORMAL LOW (ref 150–400)
RBC: 4.54 MIL/uL (ref 3.87–5.11)
RDW: 13.4 % (ref 11.5–15.5)
WBC: 5.4 10*3/uL (ref 4.0–10.5)
nRBC: 0 % (ref 0.0–0.2)

## 2023-07-17 LAB — MAGNESIUM: Magnesium: 2.2 mg/dL (ref 1.7–2.4)

## 2023-07-17 LAB — PHOSPHORUS: Phosphorus: 3.3 mg/dL (ref 2.5–4.6)

## 2023-07-17 MED ORDER — ALPRAZOLAM 0.5 MG PO TABS: 0.5000 mg | ORAL_TABLET | Freq: Three times a day (TID) | ORAL | 0 refills | Status: DC | PRN

## 2023-07-17 MED ORDER — SERTRALINE HCL 100 MG PO TABS: ORAL_TABLET | ORAL | Status: DC

## 2023-07-17 MED ORDER — BUSPIRONE HCL 7.5 MG PO TABS: 7.5000 mg | ORAL_TABLET | Freq: Three times a day (TID) | ORAL | 1 refills | Status: DC

## 2023-07-17 NOTE — TOC Transition Note (Addendum)
Transition of Care [facility]) - CM/SW Discharge Note   Patient Details  Name: [name] [name] MRN: [id number] Date of Birth: [birth year]  Transition of Care [facility] CM/SW Contact:  Howell Rucks, RN Phone Number: 07/17/2023, 10:35 AM   Clinical Narrative: [facility] consult for RW and [facility] PT/OT. [employer] rep-Jermaine, to delivery RW to bedside. Pt agreeable to [facility] PT/OT, no preference. NCM will assist with [facility] provider. Pt confirmed her son will provide transport for dc today.  No further TOC needs identified. .    -10:45am [facility] PT/OT, rep- Amy      Final next level of care: Home w Home Health Services Barriers to Discharge: Barriers Resolved   Patient Goals and CMS Choice CMS Medicare.gov Compare Post Acute Care list provided to:: Patient Choice offered to / list presented to : Patient  Discharge Placement                         Discharge Plan and Services Additional resources added to the After Visit Summary for                  DME Arranged: Dan Humphreys DME Agency: [employer] Date DME Agency Contacted: 07/17/23 Time DME Agency Contacted: 1034 Representative spoke with at DME Agency: Vaughan Basta            Social Determinants of Health (SDOH) Interventions SDOH Screenings   Food Insecurity: No Food Insecurity (07/14/2023)  Housing: Low Risk  (07/14/2023)  Transportation Needs: No Transportation Needs (07/14/2023)  Utilities: Not At Risk (07/14/2023)  Alcohol Screen: Low Risk  (12/07/2022)  Depression (PHQ2-9): Medium Risk (04/27/2023)  Financial Resource Strain: Medium Risk (12/07/2022)  Physical Activity: Insufficiently Active (12/07/2022)  Social Connections: Socially Integrated (12/07/2022)  Stress: No Stress Concern Present (12/07/2022)  Tobacco Use: Medium Risk (07/13/2023)     Readmission Risk Interventions    07/14/2023    3:15 PM  Readmission Risk Prevention Plan  Transportation Screening Complete  PCP or Specialist Appt within 5-7 Days Complete  Home  Care Screening Complete  Medication Review (RN CM) Complete

## 2023-07-17 NOTE — Discharge Summary (Addendum)
Physician Discharge Summary  [name] [name] DOB: February 10, 1956 DOA: 07/13/2023  PCP: [name] Christians, DO  Admit date: 07/13/2023 Discharge date: 07/17/2023 Admitted From: Home Disposition: Home Recommendations for Outpatient Follow-up:  Follow up with PCP in 1 week Check CMP and CBC at follow-up Patient could be at risk for polypharmacy on significant dose of Xanax, trazodone and gabapentin.  Reassess Please follow up on the following pending results: Urine culture speciation and sensitivity  Home Health: PT/OT Equipment/Devices: Rolling walker  Discharge Condition: Stable CODE STATUS: Full code  Follow-up Information     [name], [name], DO. Schedule an appointment as soon as possible for a visit in 1 week(s).   Specialty: Internal Medicine Contact information: [address] [place] [address] [phone]         [facility] Follow up.   Why: Dispensing optician information: [address] [place],. [place] [address]  Phone: [phone]---        [facility] Follow up.   Why: [facility] Contact information: [address] [place] [address] [phone]                 Hospital course 67 year old F with PMH of HTN, HLD, anxiety, depression and obesity brought to ED by EMS after "passing out in her bathroom".  Patient states feeling anxious and taking 1 tablet of Xanax before she went into her bathtub about 5 PM and woke up in bathtub the next day about 3 PM and called out for help, and her landlord called EMS.  She was admitted with working diagnosis of syncope, rhabdomyolysis and sepsis due to suspected UTI.  Started on IV fluid and IV ceftriaxone.  Orthostatic vitals negative.  TTE without significant finding.   On the day of discharge, patient felt well and ready to go home.  Rhabdomyolysis resolved.  Blood cultures NGTD.  Urine culture with  E. coli but sensitivity pending.  Patient had no UTI symptoms other than the stress incontinence.  Completed 5 days of IV ceftriaxone in-house.   Given risk for polypharmacy, we have decreased Xanax from 1 mg to 0.5 mg 3 times daily as needed.  We have increased BuSpar from 5 to 7.5 mg 3 times daily.  Will resume his Zoloft at 100 mg daily with a plan to increase by 100 mg weekly until she is back to her home 300 mg daily.  She is also on trazodone and gabapentin.  Please reassess anxiety and need of these medications at follow-up.  See individual problem list below for more.   Problems addressed during this hospitalization Active Problems:   Sepsis due to gram-negative UTI (HCC)   Polypharmacy   Traumatic rhabdomyolysis (HCC)   Elevated liver enzymes   Anxiety state   Polypharmacy versus syncope: Patient with history of anxiety.  Reports taking 1 mg Xanax 2-3 times as needed.  She is also on trazodone and low-dose gabapentin.  She is not compliant with Zoloft.  TTE without significant finding.  EKG and telemetry without significant finding.  Orthostatic vitals negative.  UA concerning for UTI.  Urine culture with E. coli although patient denies UTI symptoms other than stress incontinence. -Extensive discussion about polypharmacy.  She agrees with reduced dose of Xanax -Increase BuSpar to 7.5 mg 3 times daily -Zoloft already resumed at 100 mg daily.  Supposed to be on 300 mg daily but not compliant.  She  has not taken this in a while. -Discontinue Atarax   Sepsis secondary to E. coli UTI: POA.  Had leukocytosis and tachypnea on admission.  UA concerning for UTI.  Urine culture with E. coli.  Sensitivity pending.  No prior history of ESBL. -Completed 5 days of IV ceftriaxone in house.   Anxiety and depression: Anxiety not well-controlled. -Adjusted meds as above   Abnormal blood culture: Blood culture 9/6 with Staph hominis in 1 out of 4 bottles likely contaminant.  Repeat blood culture  NGTD.  Bacteremia ruled out   Traumatic rhabdomyolysis: CK improved from 4631-1130. -Encouraged oral hydration.   Elevated LFT/hyperbilirubinemia: Likely due to rhabdomyolysis.  Improved -Recheck CMP in 1 week   Hyperlipidemia -Continue to hold statin given her rhabdomyolysis and worsening CK   Hypokalemia: Resolved.   GERD/GI Prophylaxis -Continue PPI   Fall at home/generalized weakness -HH PT/OT and rolling walker ordered.    Obesity Body mass index is 32.75 kg/m.           Time spent 45 minutes  Vital signs Vitals:   07/16/23 0519 07/16/23 1448 07/16/23 2031 07/17/23 0347  BP: 118/77 120/70 138/77 (!) 141/98  Pulse: (!) 57 70 66 71  Temp: 98.5 F (36.9 C) 98.4 F (36.9 C) 98.6 F (37 C) 98.6 F (37 C)  Resp: 20 (!) 24 18 18   Height:      Weight:      SpO2: 92% 90% 92% 94%  TempSrc: Oral Oral Oral Oral     Discharge exam  GENERAL: No apparent distress.  Nontoxic. HEENT: MMM.  Vision and hearing grossly intact.  NECK: Supple.  No apparent JVD.  RESP:  No IWOB.  Fair aeration bilaterally. CVS:  RRR. Heart sounds normal.  ABD/GI/GU: BS+. Abd soft, NTND.  MSK/EXT:  Moves extremities. No apparent deformity. No edema.  SKIN: no apparent skin lesion or wound NEURO: Awake and alert. Oriented appropriately.  No apparent focal neuro deficit. PSYCH: Calm. Normal affect.   Discharge Instructions Discharge Instructions     Diet general   Complete by: As directed    Discharge instructions   Complete by: As directed    It has been a pleasure taking care of you!  You were hospitalized after you passed out for prolonged period of time.  We suspect this is related to your medications.  Combination of Xanax, gabapentin and trazodone can increase your risk of fall, sedation and confusion.  Please talk to your prescriber/PCP about these medications.  For now, we have decreased his Xanax to 0.5 mg 3 times daily as needed for anxiety.  We have also increase to BuSpar  for anxiety.  Continue taking his Zoloft.   Take care,   Increase activity slowly   Complete by: As directed       Allergies as of 07/17/2023       Reactions   Ibuprofen Anaphylaxis, Hives, Swelling   Throat swells   Clonazepam Other (See Comments)   Dizziness        Medication List     TAKE these medications    acetaminophen 500 MG tablet Commonly known as: TYLENOL Take 1,000 mg by mouth every 6 (six) hours as needed (for pain OR back pain).   albuterol 108 (90 Base) MCG/ACT inhaler Commonly known as: VENTOLIN HFA Inhale 2 puffs into the lungs every 6 (six) hours as needed for wheezing or shortness of breath.   ALPRAZolam 0.5 MG tablet Commonly known as: XANAX Take 1 tablet (0.5 mg  total) by mouth 3 (three) times daily as needed for anxiety. What changed:  medication strength See the new instructions.   atorvastatin 10 MG tablet Commonly known as: LIPITOR TAKE 1 TABLET(10 MG) BY MOUTH DAILY   busPIRone 7.5 MG tablet Commonly known as: BUSPAR Take 1 tablet (7.5 mg total) by mouth 3 (three) times daily. What changed:  medication strength how much to take   gabapentin 100 MG capsule Commonly known as: NEURONTIN TAKE 1 CAPSULE(100 MG) BY MOUTH THREE TIMES DAILY   Ozempic (1 MG/DOSE) 4 MG/3ML Sopn Generic drug: Semaglutide (1 MG/DOSE) Inject 1 mg into the skin once a week.   pantoprazole 40 MG tablet Commonly known as: PROTONIX TAKE 1 TABLET(40 MG) BY MOUTH DAILY   sertraline 100 MG tablet Commonly known as: ZOLOFT Take 1 tablet (100 mg total) by mouth daily for 7 days, THEN 2 tablets (200 mg total) daily for 7 days, THEN 3 tablets (300 mg total) daily. TAKE 3 TABLETS(300 MG) BY MOUTH DAILY. Start taking on: July 17, 2023 What changed: See the new instructions.   traZODone 150 MG tablet Commonly known as: DESYREL Take 150 mg by mouth at bedtime.               Durable Medical Equipment  (From admission, onward)           Start      Ordered   07/17/23 0719  For home use only DME Walker rolling  Once       Question Answer Comment  Walker: With 5 Inch Wheels   Patient needs a walker to treat with the following condition Generalized weakness      07/17/23 0719            Consultations: None  Procedures/Studies:   ECHOCARDIOGRAM COMPLETE  Result Date: 07/14/2023    ECHOCARDIOGRAM REPORT   Patient Name:   DEMY LANDGREBE [facility] Date of Exam: 07/14/2023 Medical Rec #:  [id number]             Height:       68.0 in Accession #:    [id number]            Weight:       215.4 lb Date of Birth:  14-Nov-1955            BSA:          2.109 m Patient Age:    66 years              BP:           125/75 mmHg Patient Gender: F                     HR:           79 bpm. Exam Location:  Inpatient Procedure: 2D Echo, Cardiac Doppler and Color Doppler Indications:    Syncope  History:        Patient has prior history of Echocardiogram examinations, most                 recent 12/02/2020. COPD; Risk Factors:Sleep Apnea, Obesity and                 Former Smoker. Oxygen therapy at home.  Sonographer:    Milda Smart Referring Phys: [id number] Darlin Drop  Sonographer Comments: Patient is obese. Image acquisition challenging due to patient body habitus and Image acquisition challenging due to respiratory motion. IMPRESSIONS  1. Left ventricular ejection  fraction, by estimation, is 60 to 65%. Left ventricular ejection fraction by 2D MOD biplane is 64.7 %. The left ventricle has normal function. The left ventricle has no regional wall motion abnormalities. Left ventricular diastolic parameters are consistent with Grade I diastolic dysfunction (impaired relaxation). Elevated left ventricular end-diastolic pressure. The E/e' is 16.  2. Right ventricular systolic function is low normal. The right ventricular size is mildly enlarged. Tricuspid regurgitation signal is inadequate for assessing PA pressure.  3. Right atrial size was mildly dilated.  4. The mitral  valve was not well visualized. Mild mitral valve regurgitation. No evidence of mitral stenosis.  5. The aortic valve was not well visualized. Aortic valve regurgitation is not visualized. No aortic stenosis is present.  6. Aortic dilatation noted. There is borderline dilatation of the aortic root, measuring 38 mm. There is borderline dilatation of the ascending aorta, measuring 39 mm.  7. The inferior vena cava is dilated in size with >50% respiratory variability, suggesting right atrial pressure of 8 mmHg. Comparison(s): Changes from prior study are noted. 12/02/2020: LVEF 55-60%, normal RV systolic function. FINDINGS  Left Ventricle: Left ventricular ejection fraction, by estimation, is 60 to 65%. Left ventricular ejection fraction by 2D MOD biplane is 64.7 %. The left ventricle has normal function. The left ventricle has no regional wall motion abnormalities. The left ventricular internal cavity size was normal in size. There is no left ventricular hypertrophy. Left ventricular diastolic parameters are consistent with Grade I diastolic dysfunction (impaired relaxation). Elevated left ventricular end-diastolic pressure. The E/e' is 16. Right Ventricle: The right ventricular size is mildly enlarged. No increase in right ventricular wall thickness. Right ventricular systolic function is low normal. Tricuspid regurgitation signal is inadequate for assessing PA pressure. Left Atrium: Left atrial size was normal in size. Right Atrium: Right atrial size was mildly dilated. Pericardium: There is no evidence of pericardial effusion. Mitral Valve: The mitral valve was not well visualized. Mild mitral valve regurgitation. No evidence of mitral valve stenosis. MV peak gradient, 4.2 mmHg. The mean mitral valve gradient is 2.0 mmHg. Tricuspid Valve: The tricuspid valve is not well visualized. Tricuspid valve regurgitation is not demonstrated. No evidence of tricuspid stenosis. Aortic Valve: The aortic valve was not well  visualized. Aortic valve regurgitation is not visualized. No aortic stenosis is present. Pulmonic Valve: The pulmonic valve was not well visualized. Pulmonic valve regurgitation is not visualized. No evidence of pulmonic stenosis. Aorta: Aortic dilatation noted. There is borderline dilatation of the aortic root, measuring 38 mm. There is borderline dilatation of the ascending aorta, measuring 39 mm. Venous: The inferior vena cava is dilated in size with greater than 50% respiratory variability, suggesting right atrial pressure of 8 mmHg. IAS/Shunts: No atrial level shunt detected by color flow Doppler.  LEFT VENTRICLE PLAX 2D                        Biplane EF (MOD) LVIDd:         5.30 cm         LV Biplane EF:   Left LVIDs:         4.30 cm                          ventricular LV PW:         1.10 cm  ejection LV IVS:        0.90 cm                          fraction by LVOT diam:     2.10 cm                          2D MOD LV SV:         89                               biplane is LV SV Index:   42                               64.7 %. LVOT Area:     3.46 cm                                Diastology                                LV e' medial:    3.48 cm/s LV Volumes (MOD)               LV E/e' medial:  19.9 LV vol d, MOD    83.9 ml       LV e' lateral:   5.66 cm/s A2C:                           LV E/e' lateral: 12.3 LV vol d, MOD    78.6 ml A4C: LV vol s, MOD    31.7 ml A2C: LV vol s, MOD    27.8 ml A4C: LV SV MOD A2C:   52.2 ml LV SV MOD A4C:   78.6 ml LV SV MOD BP:    55.9 ml RIGHT VENTRICLE RV Basal diam:  4.00 cm RV Mid diam:    3.30 cm RV S prime:     9.90 cm/s TAPSE (M-mode): 1.4 cm LEFT ATRIUM             Index        RIGHT ATRIUM           Index LA diam:        3.20 cm 1.52 cm/m   RA Area:     12.90 cm LA Vol (A2C):   39.2 ml 18.56 ml/m  RA Volume:   30.60 ml  14.51 ml/m LA Vol (A4C):   22.4 ml 10.62 ml/m LA Biplane Vol: 33.7 ml 15.98 ml/m  AORTIC VALVE LVOT Vmax:   114.00 cm/s  LVOT Vmean:  77.800 cm/s LVOT VTI:    0.256 m  AORTA Ao Root diam: 3.80 cm Ao Asc diam:  3.90 cm MITRAL VALVE MV Area (PHT): 2.48 cm    SHUNTS MV Area VTI:   3.46 cm    Systemic VTI:  0.26 m MV Peak grad:  4.2 mmHg    Systemic Diam: 2.10 cm MV Mean grad:  2.0 mmHg MV Vmax:       1.03 m/s MV Vmean:      61.8 cm/s MV Decel Time: 306 msec MR Peak grad: 22.5 mmHg MR Vmax:  237.00 cm/s MV E velocity: 69.40 cm/s MV A velocity: 89.10 cm/s MV E/A ratio:  0.78 Zoila Shutter MD Electronically signed by Zoila Shutter MD Signature Date/Time: 07/14/2023/2:04:48 PM    Final    CT Head Wo Contrast  Result Date: 07/13/2023 CLINICAL DATA:  Brought in due to fall in bathtub, family believe patient bathtub for 24 hours. Patient has no memory of the event. Patient not oriented to time EXAM: CT HEAD WITHOUT CONTRAST TECHNIQUE: Contiguous axial images were obtained from the base of the skull through the vertex without intravenous contrast. RADIATION DOSE REDUCTION: This exam was performed according to the departmental dose-optimization program which includes automated exposure control, adjustment of the mA and/or kV according to patient size and/or use of iterative reconstruction technique. COMPARISON:  CT head 04/21/2023 FINDINGS: Brain: No intracranial hemorrhage, mass effect, or evidence of acute infarct. No hydrocephalus. No extra-axial fluid collection. Vascular: No hyperdense vessel or unexpected calcification. Skull: No fracture or focal lesion. Sinuses/Orbits: No acute finding. Other: None. IMPRESSION: No acute intracranial abnormality. Electronically Signed   By: Minerva Fester M.D.   On: 07/13/2023 20:57   DG Chest 2 View  Result Date: 07/13/2023 CLINICAL DATA:  Fall in bathtub. Family believe patient was in bathroom for 24 hours. No memory of the event. Abrasion to right arm. EXAM: CHEST - 2 VIEW COMPARISON:  Radiograph 04/20/2023 FINDINGS: Stable cardiomediastinal silhouette. Aortic atherosclerotic calcification.  Hyperinflation and chronic bronchitic change. No focal consolidation, pleural effusion, or pneumothorax. No displaced rib fractures. IMPRESSION: No acute cardiopulmonary disease. Electronically Signed   By: Minerva Fester M.D.   On: 07/13/2023 20:33       The results of significant diagnostics from this hospitalization (including imaging, microbiology, ancillary and laboratory) are listed below for reference.     Microbiology: Recent Results (from the past 240 hour(s))  Urine Culture     Status: Abnormal (Preliminary result)   Collection Time: 07/13/23  8:43 PM   Specimen: Urine, Random  Result Value Ref Range Status   Specimen Description   Final    URINE, RANDOM Performed at [facility], [address] [place], [place] [address]    Special Requests   Final    NONE Reflexed from [id number] Performed at [facility], [address], [place], [place] [address]    Culture (A)  Final    >=100,000 COLONIES/mL ESCHERICHIA COLI SUSCEPTIBILITIES TO FOLLOW Performed at [facility], [address] [place], [place] [address]    Report Status PENDING  Incomplete  Culture, blood (Routine X 2) w Reflex to ID Panel     Status: Abnormal   Collection Time: 07/14/23  4:20 AM   Specimen: BLOOD RIGHT HAND  Result Value Ref Range Status   Specimen Description   Final    BLOOD RIGHT HAND Performed at [facility], [address] [place], [place] [address]    Special Requests   Final    BOTTLES DRAWN AEROBIC AND ANAEROBIC Blood Culture adequate volume Performed at [facility], [address] [place], [place] [address]    Culture  Setup Time   Final    GRAM POSITIVE COCCI IN CLUSTERS AEROBIC BOTTLE ONLY CRITICAL RESULT CALLED TO, READ BACK BY AND VERIFIED WITH: PHARMD C. SHADE 07/15/23 @ 0408 BY AB    Culture (A)  Final    STAPHYLOCOCCUS HOMINIS THE SIGNIFICANCE OF ISOLATING THIS ORGANISM FROM A SINGLE SET OF  BLOOD CULTURES WHEN MULTIPLE SETS  ARE DRAWN IS UNCERTAIN. PLEASE NOTIFY THE MICROBIOLOGY DEPARTMENT WITHIN ONE WEEK IF SPECIATION AND SENSITIVITIES ARE REQUIRED. Performed at [facility], [address], [place], [place] [address]    Report Status 07/16/2023 FINAL  Final  Blood Culture ID Panel (Reflexed)     Status: Abnormal   Collection Time: 07/14/23  4:20 AM  Result Value Ref Range Status   Enterococcus faecalis NOT DETECTED NOT DETECTED Final   Enterococcus Faecium NOT DETECTED NOT DETECTED Final   Listeria monocytogenes NOT DETECTED NOT DETECTED Final   Staphylococcus species DETECTED (A) NOT DETECTED Final    Comment: CRITICAL RESULT CALLED TO, READ BACK BY AND VERIFIED WITH: PHARMD C. SHADE 07/15/23 @ 0408 BY AB    Staphylococcus aureus (BCID) NOT DETECTED NOT DETECTED Final   Staphylococcus epidermidis NOT DETECTED NOT DETECTED Final   Staphylococcus lugdunensis NOT DETECTED NOT DETECTED Final   Streptococcus species NOT DETECTED NOT DETECTED Final   Streptococcus agalactiae NOT DETECTED NOT DETECTED Final   Streptococcus pneumoniae NOT DETECTED NOT DETECTED Final   Streptococcus pyogenes NOT DETECTED NOT DETECTED Final   A.calcoaceticus-baumannii NOT DETECTED NOT DETECTED Final   Bacteroides fragilis NOT DETECTED NOT DETECTED Final   Enterobacterales NOT DETECTED NOT DETECTED Final   Enterobacter cloacae complex NOT DETECTED NOT DETECTED Final   Escherichia coli NOT DETECTED NOT DETECTED Final   Klebsiella aerogenes NOT DETECTED NOT DETECTED Final   Klebsiella oxytoca NOT DETECTED NOT DETECTED Final   Klebsiella pneumoniae NOT DETECTED NOT DETECTED Final   Proteus species NOT DETECTED NOT DETECTED Final   Salmonella species NOT DETECTED NOT DETECTED Final   Serratia marcescens NOT DETECTED NOT DETECTED Final   Haemophilus influenzae NOT DETECTED NOT DETECTED Final   Neisseria meningitidis NOT DETECTED NOT DETECTED Final   Pseudomonas aeruginosa NOT DETECTED NOT  DETECTED Final   Stenotrophomonas maltophilia NOT DETECTED NOT DETECTED Final   Candida albicans NOT DETECTED NOT DETECTED Final   Candida auris NOT DETECTED NOT DETECTED Final   Candida glabrata NOT DETECTED NOT DETECTED Final   Candida krusei NOT DETECTED NOT DETECTED Final   Candida parapsilosis NOT DETECTED NOT DETECTED Final   Candida tropicalis NOT DETECTED NOT DETECTED Final   Cryptococcus neoformans/gattii NOT DETECTED NOT DETECTED Final    Comment: Performed at [facility], [address] [place], [place] [address]  Culture, blood (Routine X 2) w Reflex to ID Panel     Status: Abnormal   Collection Time: 07/14/23  4:29 AM   Specimen: BLOOD RIGHT ARM  Result Value Ref Range Status   Specimen Description   Final    BLOOD RIGHT ARM Performed at [facility], [address] [place], [place] [address]    Special Requests   Final    BOTTLES DRAWN AEROBIC AND ANAEROBIC Blood Culture adequate volume Performed at [facility], [address], [place], [place] [address]    Culture  Setup Time   Final    GRAM POSITIVE COCCI IN CLUSTERS AEROBIC BOTTLE ONLY CRITICAL VALUE NOTED.  VALUE IS CONSISTENT WITH PREVIOUSLY REPORTED AND CALLED VALUE.    Culture (A)  Final    STAPHYLOCOCCUS HAEMOLYTICUS THE SIGNIFICANCE OF ISOLATING THIS ORGANISM FROM A SINGLE SET OF BLOOD CULTURES WHEN MULTIPLE SETS ARE DRAWN IS UNCERTAIN. PLEASE NOTIFY THE MICROBIOLOGY DEPARTMENT WITHIN ONE WEEK IF SPECIATION AND SENSITIVITIES ARE REQUIRED. Performed at [facility], [address] [place], [place] [address]    Report Status 07/16/2023 FINAL  Final  Culture, blood (Routine X 2) w Reflex to ID Panel     Status: None (Preliminary result)   Collection Time: 07/15/23  2:30 PM   Specimen: BLOOD RIGHT HAND  Result Value Ref Range Status   Specimen Description   Final    BLOOD RIGHT HAND Performed at [facility], [address], [place], [place]  [address]    Special Requests   Final    BOTTLES DRAWN AEROBIC ONLY Blood Culture results may not be optimal due to an inadequate volume of blood received in culture bottles Performed at [facility], [address] [place], [place] [address]    Culture   Final    NO GROWTH 2 DAYS Performed at [facility], [address], [place], [place] [address]    Report Status PENDING  Incomplete  Culture, blood (Routine X 2) w Reflex to ID Panel     Status: None (Preliminary result)   Collection Time: 07/15/23  2:30 PM   Specimen: BLOOD RIGHT HAND  Result Value Ref Range Status   Specimen Description   Final    BLOOD RIGHT HAND Performed at [facility], [address], [place], [place] [address]    Special Requests   Final    BOTTLES DRAWN AEROBIC ONLY Blood Culture adequate volume Performed at [facility], [address] [place], [place] [address]    Culture   Final    NO GROWTH 2 DAYS Performed at [facility], [address] [place], [place] [address]    Report Status PENDING  Incomplete     Labs:  CBC: Recent Labs  Lab 07/13/23 2003 07/14/23 0429 07/15/23 0407 07/16/23 0752 07/17/23 0405  WBC 15.5* 10.4 7.3 5.0 5.4  NEUTROABS  --   --  5.0 3.3  --   HGB 17.4* 15.6* 14.7 13.4 14.2  HCT 50.6* 46.4* 43.2 41.3 42.8  MCV 90.0 92.1 92.9 94.9 94.3  PLT 151 121* 115* 103* 123*   BMP &GFR Recent Labs  Lab 07/13/23 2003 07/14/23 0429 07/15/23 0407 07/16/23 0752 07/17/23 0405  NA 139 136 138 141 137  K 4.0 3.3* 4.1 4.3 3.5  CL 103 102 106 105 103  CO2 23 23 23 27 26   GLUCOSE 110* 105* 103* 95 102*  BUN 13 14 9  6* 6*  CREATININE 0.55 0.54 0.54 0.52 0.46  CALCIUM 9.7 8.6* 8.8* 8.5* 8.6*  MG  --  2.1 2.1 2.1 2.2  PHOS  --  3.0 3.1 3.6 3.3   Estimated Creatinine Clearance: 83.4 mL/min (by C-G formula based on SCr of 0.46 mg/dL). Liver & Pancreas: Recent Labs  Lab 07/13/23 2003 07/14/23 0429 07/15/23 0407  07/16/23 0752 07/17/23 0405  AST 114* 126* 155* 102* 73*  ALT 25 26 35 34 31  ALKPHOS 99 84 75 67 71  BILITOT 2.7* 2.0* 0.9 0.9 0.9  PROT 7.3 6.0* 5.9* 5.4* 6.0*  ALBUMIN 3.9 3.3* 3.3* 3.0* 3.2*   Recent Labs  Lab 07/13/23 2003  LIPASE 23   No results for input(s): "AMMONIA" in the last 168 hours. Diabetic: No results for input(s): "HGBA1C" in the last 72 hours. Recent Labs  Lab 07/13/23 1840  GLUCAP 122*   Cardiac Enzymes: Recent Labs  Lab 07/13/23 2003 07/14/23 0429 07/15/23 0407 07/16/23 0752 07/17/23 0405  CKTOTAL 4,631* 4,485* 4,579* 2,458* 1,130*   No results for input(s): "PROBNP" in the last 8760 hours. Coagulation Profile: No  results for input(s): "INR", "PROTIME" in the last 168 hours. Thyroid Function Tests: No results for input(s): "TSH", "T4TOTAL", "FREET4", "T3FREE", "THYROIDAB" in the last 72 hours. Lipid Profile: No results for input(s): "CHOL", "HDL", "LDLCALC", "TRIG", "CHOLHDL", "LDLDIRECT" in the last 72 hours. Anemia Panel: No results for input(s): "VITAMINB12", "FOLATE", "FERRITIN", "TIBC", "IRON", "RETICCTPCT" in the last 72 hours. Urine analysis:    Component Value Date/Time   COLORURINE RED (A) 07/13/2023 2043   APPEARANCEUR TURBID (A) 07/13/2023 2043   APPEARANCEUR Cloudy (A) 04/13/2023 1500   LABSPEC 1.025 07/13/2023 2043   PHURINE 5.0 07/13/2023 2043   GLUCOSEU NEGATIVE 07/13/2023 2043   HGBUR SMALL (A) 07/13/2023 2043   BILIRUBINUR NEGATIVE 07/13/2023 2043   BILIRUBINUR Negative 04/13/2023 1500   KETONESUR 5 (A) 07/13/2023 2043   PROTEINUR 30 (A) 07/13/2023 2043   UROBILINOGEN 1.0 07/28/2014 1850   NITRITE POSITIVE (A) 07/13/2023 2043   LEUKOCYTESUR LARGE (A) 07/13/2023 2043   Sepsis Labs: Invalid input(s): "PROCALCITONIN", "LACTICIDVEN"   SIGNED:  Almon Hercules, MD  [facility] 07/17/2023, 12:23 PM

## 2023-07-17 NOTE — Plan of Care (Signed)
  Problem: Activity: Goal: Risk for activity intolerance will decrease Outcome: Progressing   Problem: Nutrition: Goal: Adequate nutrition will be maintained Outcome: Progressing   Problem: Safety: Goal: Ability to remain free from injury will improve Outcome: Progressing   

## 2023-07-18 ENCOUNTER — Telehealth: Payer: Self-pay

## 2023-07-18 LAB — URINE CULTURE: Culture: >=100000 — AB

## 2023-07-18 NOTE — Transitions of Care (Post Inpatient/ED Visit) (Signed)
07/18/2023  Name: [name] [name] MRN: [id number] DOB: September 01, 1956  Today's TOC FU Call Status: Today's TOC FU Call Status:: Successful TOC FU Call Completed TOC FU Call Complete Date: 07/18/23 Patient's Name and Date of Birth confirmed.  Transition Care Management Follow-up Telephone Call Date of Discharge: 07/17/23 Discharge Facility: [facility]) Type of Discharge: Inpatient Admission Primary Inpatient Discharge Diagnosis:: rhabdomyolysis How have you been since you were released from the hospital?: Better Any questions or concerns?: No  Items Reviewed: Did you receive and understand the discharge instructions provided?: Yes Medications obtained,verified, and reconciled?: Yes (Medications Reviewed) Any new allergies since your discharge?: No Dietary orders reviewed?: Yes Do you have support at home?: No  Medications Reviewed Today: Medications Reviewed Today     Reviewed by Karena Addison, LPN (Licensed Practical Nurse) on 07/18/23 at 1041  Med List Status: <None>   Medication Order Taking? Sig Documenting Provider Last Dose Status Informant  acetaminophen (TYLENOL) 500 MG tablet [id number] No Take 1,000 mg by mouth every 6 (six) hours as needed (for pain OR back pain).  [provider] Unknown Active Self, Pharmacy Records  albuterol (VENTOLIN HFA) 108 (90 Base) MCG/ACT inhaler [id number] No Inhale 2 puffs into the lungs every 6 (six) hours as needed for wheezing or shortness of breath. Remo Lipps, MD Unknown Active Self, Pharmacy Records  ALPRAZolam Prudy Feeler) 0.5 MG tablet [id number]  Take 1 tablet (0.5 mg total) by mouth 3 (three) times daily as needed for anxiety. Almon Hercules, MD  Active   atorvastatin (LIPITOR) 10 MG tablet [id number] No TAKE 1 TABLET(10 MG) BY MOUTH DAILY [name], [name], DO 07/13/2023 Active Self, Pharmacy Records  busPIRone (BUSPAR) 7.5 MG tablet [id number]  Take 1 tablet (7.5 mg total) by mouth 3 (three) times daily. Almon Hercules, MD   Active   gabapentin (NEURONTIN) 100 MG capsule [id number] No TAKE 1 CAPSULE(100 MG) BY MOUTH THREE TIMES DAILY Vivi Barrack, DPM Unknown Active Self, Pharmacy Records           Med Note Deloria Lair, DUROJAHYE' R   Thu Jul 13, 2023  9:46 PM) Pt states that she takes her husbands gabapentin because she could not get a refill.  OZEMPIC, 1 MG/DOSE, 4 MG/3ML SOPN [id number] No Inject 1 mg into the skin once a week. [provider] 07/09/2023 Active Self, Pharmacy Records  pantoprazole (PROTONIX) 40 MG tablet [id number] No TAKE 1 TABLET(40 MG) BY MOUTH DAILY [name], [name], DO 07/13/2023 Active Self, Pharmacy Records  sertraline (ZOLOFT) 100 MG tablet [id number]  Take 1 tablet (100 mg total) by mouth daily for 7 days, THEN 2 tablets (200 mg total) daily for 7 days, THEN 3 tablets (300 mg total) daily. TAKE 3 TABLETS(300 MG) BY MOUTH DAILY. Almon Hercules, MD  Active   traZODone (DESYREL) 150 MG tablet [id number] No Take 150 mg by mouth at bedtime. [provider] 07/12/2023 Active Self, Pharmacy Records            Home Care and Equipment/Supplies: Were Home Health Services Ordered?: NA Has Agency set up a time to come to your home?: No Any new equipment or medical supplies ordered?: Yes Name of Medical supply agency?: [employer] Were you able to get the equipment/medical supplies?: Yes Do you have any questions related to the use of the equipment/supplies?: No  Functional Questionnaire: Do you need assistance with bathing/showering or dressing?: Yes Do you need assistance with meal preparation?: No Do you need assistance with eating?: No Do you  have difficulty maintaining continence: No Do you need assistance with getting out of bed/getting out of a chair/moving?: No Do you have difficulty managing or taking your medications?: No  Follow up appointments reviewed: PCP Follow-up appointment confirmed?: No (no avail appts, sent message to staff to schedule) Specialist Hospital Follow-up  appointment confirmed?: NA Do you need transportation to your follow-up appointment?: No Do you understand care options if your condition(s) worsen?: Yes-patient verbalized understanding    SIGNATURE Karena Addison, LPN [facility] Nurse Health Advisor Direct Dial [phone]

## 2023-07-19 ENCOUNTER — Telehealth: Payer: Self-pay | Admitting: *Deleted

## 2023-07-19 NOTE — Telephone Encounter (Signed)
Call from [place], [facility] needs verbal orders for PT 2 times a week for 3 weeks and 1 time a week for 3 weeks.  Needs for Gait Training, Strengthening, Balance and Home Safety. Verbal approval given will forward to Yellow Team for denial or approval.

## 2023-07-20 LAB — CULTURE, BLOOD (ROUTINE X 2)
Culture: NO GROWTH
Culture: NO GROWTH
Special Requests: ADEQUATE

## 2023-07-31 ENCOUNTER — Ambulatory Visit (INDEPENDENT_AMBULATORY_CARE_PROVIDER_SITE_OTHER): Payer: Self-pay | Admitting: Physician Assistant

## 2023-07-31 DIAGNOSIS — Z91199 Patient's noncompliance with other medical treatment and regimen due to unspecified reason: Secondary | ICD-10-CM

## 2023-07-31 NOTE — Progress Notes (Signed)
No show

## 2023-08-07 ENCOUNTER — Ambulatory Visit: Payer: Medicare HMO | Admitting: Student

## 2023-08-07 ENCOUNTER — Other Ambulatory Visit: Payer: Self-pay

## 2023-08-07 ENCOUNTER — Encounter: Payer: Self-pay | Admitting: Student

## 2023-08-07 VITALS — BP 113/72 | HR 86 | Temp 98.1°F | Ht 68.0 in | Wt 215.3 lb

## 2023-08-07 DIAGNOSIS — R102 Pelvic and perineal pain: Secondary | ICD-10-CM

## 2023-08-07 DIAGNOSIS — Z79899 Other long term (current) drug therapy: Secondary | ICD-10-CM

## 2023-08-07 DIAGNOSIS — N3946 Mixed incontinence: Secondary | ICD-10-CM

## 2023-08-07 DIAGNOSIS — T796XXD Traumatic ischemia of muscle, subsequent encounter: Secondary | ICD-10-CM | POA: Diagnosis not present

## 2023-08-07 DIAGNOSIS — R32 Unspecified urinary incontinence: Secondary | ICD-10-CM

## 2023-08-07 DIAGNOSIS — Z87440 Personal history of urinary (tract) infections: Secondary | ICD-10-CM

## 2023-08-07 DIAGNOSIS — R748 Abnormal levels of other serum enzymes: Secondary | ICD-10-CM

## 2023-08-07 LAB — POCT URINALYSIS DIPSTICK
Bilirubin, UA: NEGATIVE
Blood, UA: NEGATIVE
Glucose, UA: NEGATIVE
Ketones, UA: NEGATIVE
Leukocytes, UA: NEGATIVE
Nitrite, UA: NEGATIVE
Protein, UA: NEGATIVE
Spec Grav, UA: 1.01 (ref 1.010–1.025)
Urobilinogen, UA: 0.2 U/dL
pH, UA: 6 (ref 5.0–8.0)

## 2023-08-07 MED ORDER — MIRABEGRON ER 25 MG PO TB24
25.0000 mg | ORAL_TABLET | Freq: Every day | ORAL | 3 refills | Status: DC
Start: 2023-08-07 — End: 2024-01-12

## 2023-08-07 NOTE — Patient Instructions (Addendum)
  Thank you, [name] [name], for allowing [place] to provide your care today.  I am glad you are doing well after your hospitalization.  We have tested urine today and it does not look like you have a urinary tract infection but I want you to continue watching for any pain when you urinate.  Also since you are continuing to have urinary incontinence we are going to start a medication called mirabegron (Myrbetriq) 25 mg daily for the symptoms.  The most common side effect is a slight elevation in your blood pressure and I would also like you to make sure that you are able to urinate without issue.  If you feel like you are not able to urinate please call us and stop this medication.  I have ordered the following labs for you:  Lab Orders         CMP14 + Anion Gap         POCT Urinalysis Dipstick ([id number])       Follow up: 3-4 months    Remember:     Should you have any questions or concerns please call the internal medicine clinic at [phone].     Rocky Morel, DO [facility]

## 2023-08-07 NOTE — Progress Notes (Unsigned)
CC: Hospital Follow Up   HPI:  [name] [name] is a 67 y.o. female with pertinent PMH of HTN, anxiety/depression, HLD, obesity presenting for hospital follow-up after being admitted at [facility] from 9/5 - 9/9 for syncope with prolonged downtime causing rhabdomyolysis as well as E. coli UTI. Please see assessment and plan below for further details.  Past Medical History:  Diagnosis Date   Anxiety    Asthma    Chronic, with restrictive component 2/2 obesity   COPD (chronic obstructive pulmonary disease) (HCC)    Depression    GERD (gastroesophageal reflux disease)    Headache    Insomnia    Obesity    On home oxygen therapy    "3L at night only when I get sick" (04/30/2014)   Shortness of breath dyspnea    Sleep apnea    "couldn't afford mask so I didn't get it" (04/30/2014)    Current Outpatient Medications  Medication Instructions   acetaminophen (TYLENOL) 1,000 mg, Oral, Every 6 hours PRN   albuterol (VENTOLIN HFA) 108 (90 Base) MCG/ACT inhaler 2 puffs, Inhalation, Every 6 hours PRN   ALPRAZolam (XANAX) 0.5 mg, Oral, 3 times daily PRN   atorvastatin (LIPITOR) 10 MG tablet TAKE 1 TABLET(10 MG) BY MOUTH DAILY   busPIRone (BUSPAR) 7.5 mg, Oral, 3 times daily   gabapentin (NEURONTIN) 100 MG capsule TAKE 1 CAPSULE(100 MG) BY MOUTH THREE TIMES DAILY   mirabegron ER (MYRBETRIQ) 25 mg, Oral, Daily   Ozempic (1 MG/DOSE) 1 mg, Subcutaneous, Weekly   pantoprazole (PROTONIX) 40 MG tablet TAKE 1 TABLET(40 MG) BY MOUTH DAILY   sertraline (ZOLOFT) 100 MG tablet Take 1 tablet (100 mg total) by mouth daily for 7 days, THEN 2 tablets (200 mg total) daily for 7 days, THEN 3 tablets (300 mg total) daily. TAKE 3 TABLETS(300 MG) BY MOUTH DAILY.   traZODone (DESYREL) 150 mg, Oral, Daily at bedtime     Review of Systems:   Pertinent items noted in HPI and/or A&P.  Physical Exam:  Vitals:   08/07/23 1414  BP: 113/72  Pulse: 86  Temp: 98.1 F (36.7 C)  TempSrc: Oral  SpO2: 93%   Weight: 215 lb 4.8 oz (97.7 kg)  Height: 5\' 8"  (1.727 m)    Constitutional: Well-appearing adult female. In no acute distress. HEENT: Normocephalic, atraumatic, Sclera non-icteric, PERRL, EOM intact Cardio:Regular rate and rhythm. 2+ bilateral radial pulses. Pulm:Clear to auscultation bilaterally. Normal work of breathing on room air. Abdomen: Soft, non-tender, non-distended, positive bowel sounds. ZOX:WRUEAVWU for extremity edema. Skin:Warm and dry. Neuro:Alert and oriented x3. No focal deficit noted. Psych:Pleasant mood and affect.   Assessment & Plan:   Traumatic rhabdomyolysis [facility]) Patient presents for follow-up from hospitalization about 3 weeks ago after she fell asleep in her bathtub and woke up the next day most likely caused by polypharmacy.  She was found to have significant rhabdomyolysis with a CK peak over 4000 but no significant AKI.  CK was downtrending on discharge as well as liver enzymes and bilirubin.  Since discharge she has not had any significant muscle pains, signs of myoglobinuria, or other issues.  UA today shows no hemoglobin on dip.  Urinary incontinence Patient continues to have mixed urgency and stress urinary incontinence.  Describes recurrent suprapubic pressure, urgency, frequency and nocturia.  She has incontinence with laughing, coughing, sneezing as well as not being able to make it to the bathroom in time when she feels the urge to urinate.  UA today  showed no signs of urinary tract infection and she was recently treated for ampicillin resistant E. coli UTI with 5 days of ceftriaxone. - Start Myrbetriq 25 mg daily  Polypharmacy Presents for hospital follow-up after admission for traumatic rhabdomyolysis and UTI due to being down in her bathtub for an unknown amount of time.  She took a Xanax prior to getting in the bath and then woke up the next day at least 12 hours later.  During the hospitalization the medication changes included Xanax was decreased  to 0.5 mg 3 times daily, BuSpar increased to 7.5 mg 3 times daily, and sertraline resumed at 100 mg daily with plans to uptitrate weekly to 300 mg daily.  She is also prescribed trazodone and gabapentin.  Her newest medication prior to hospitalization was buspirone.  Since her hospitalization she has not been taking buspirone, sertraline, or gabapentin.  She is only taking Xanax 0.5 mg twice daily and trazodone 150 mg nightly.  She does not want to make any changes to these medications.  She follows with psychiatry who feels these medications.  I recommend continuing to follow with them to discuss adding or changing any of her current medications.    Patient discussed with Dr. Claudie Revering, DO Internal [facility] Center Internal Medicine Resident PGY-2 Clinic Phone: [phone] Pager: [phone]

## 2023-08-08 LAB — CMP14 + ANION GAP
ALT: 6 [IU]/L (ref 0–32)
AST: 10 [IU]/L (ref 0–40)
Albumin: 3.9 g/dL (ref 3.9–4.9)
Alkaline Phosphatase: 126 [IU]/L — ABNORMAL HIGH (ref 44–121)
Anion Gap: 13 mmol/L (ref 10.0–18.0)
BUN/Creatinine Ratio: 12 (ref 12–28)
BUN: 6 mg/dL — ABNORMAL LOW (ref 8–27)
Bilirubin Total: 0.3 mg/dL (ref 0.0–1.2)
CO2: 25 mmol/L (ref 20–29)
Calcium: 9 mg/dL (ref 8.7–10.3)
Chloride: 101 mmol/L (ref 96–106)
Creatinine, Ser: 0.49 mg/dL — ABNORMAL LOW (ref 0.57–1.00)
Globulin, Total: 2.1 g/dL (ref 1.5–4.5)
Glucose: 91 mg/dL (ref 70–99)
Potassium: 3.8 mmol/L (ref 3.5–5.2)
Sodium: 139 mmol/L (ref 134–144)
Total Protein: 6 g/dL (ref 6.0–8.5)
eGFR: 103 mL/min/{1.73_m2} (ref >59)

## 2023-08-10 NOTE — Assessment & Plan Note (Signed)
Presents for hospital follow-up after admission for traumatic rhabdomyolysis and UTI due to being down in her bathtub for an unknown amount of time.  She took a Xanax prior to getting in the bath and then woke up the next day at least 12 hours later.  During the hospitalization the medication changes included Xanax was decreased to 0.5 mg 3 times daily, BuSpar increased to 7.5 mg 3 times daily, and sertraline resumed at 100 mg daily with plans to uptitrate weekly to 300 mg daily.  She is also prescribed trazodone and gabapentin.  Her newest medication prior to hospitalization was buspirone.  Since her hospitalization she has not been taking buspirone, sertraline, or gabapentin.  She is only taking Xanax 0.5 mg twice daily and trazodone 150 mg nightly.  She does not want to make any changes to these medications.  She follows with psychiatry who feels these medications.  I recommend continuing to follow with them to discuss adding or changing any of her current medications.

## 2023-08-10 NOTE — Assessment & Plan Note (Signed)
Patient continues to have mixed urgency and stress urinary incontinence.  Describes recurrent suprapubic pressure, urgency, frequency and nocturia.  She has incontinence with laughing, coughing, sneezing as well as not being able to make it to the bathroom in time when she feels the urge to urinate.  UA today showed no signs of urinary tract infection and she was recently treated for ampicillin resistant E. coli UTI with 5 days of ceftriaxone. - Start Myrbetriq 25 mg daily

## 2023-08-10 NOTE — Assessment & Plan Note (Signed)
Patient presents for follow-up from hospitalization about 3 weeks ago after she fell asleep in her bathtub and woke up the next day most likely caused by polypharmacy.  She was found to have significant rhabdomyolysis with a CK peak over 4000 but no significant AKI.  CK was downtrending on discharge as well as liver enzymes and bilirubin.  Since discharge she has not had any significant muscle pains, signs of myoglobinuria, or other issues.  UA today shows no hemoglobin on dip.

## 2023-08-11 ENCOUNTER — Encounter: Payer: Self-pay | Admitting: Physician Assistant

## 2023-08-11 ENCOUNTER — Ambulatory Visit (INDEPENDENT_AMBULATORY_CARE_PROVIDER_SITE_OTHER): Payer: Medicare HMO | Admitting: Physician Assistant

## 2023-08-11 DIAGNOSIS — G4700 Insomnia, unspecified: Secondary | ICD-10-CM | POA: Diagnosis not present

## 2023-08-11 DIAGNOSIS — F411 Generalized anxiety disorder: Secondary | ICD-10-CM | POA: Diagnosis not present

## 2023-08-11 DIAGNOSIS — Z9289 Personal history of other medical treatment: Secondary | ICD-10-CM

## 2023-08-11 DIAGNOSIS — F331 Major depressive disorder, recurrent, moderate: Secondary | ICD-10-CM | POA: Diagnosis not present

## 2023-08-11 DIAGNOSIS — Z6379 Other stressful life events affecting family and household: Secondary | ICD-10-CM

## 2023-08-11 MED ORDER — TRAZODONE HCL 150 MG PO TABS: 150.0000 mg | ORAL_TABLET | Freq: Every day | ORAL | 1 refills | Status: DC

## 2023-08-11 MED ORDER — ALPRAZOLAM 0.5 MG PO TABS: 0.5000 mg | ORAL_TABLET | Freq: Three times a day (TID) | ORAL | 1 refills | Status: DC | PRN

## 2023-08-11 MED ORDER — SERTRALINE HCL 100 MG PO TABS: ORAL_TABLET | ORAL | 1 refills | Status: DC

## 2023-08-11 NOTE — Progress Notes (Signed)
[facility] Med Check  Patient ID: [name] [name],  MRN: [id number]  PCP: Rudene Christians, DO  Date of Evaluation: 08/11/2023 Time spent:35 minutes  Chief Complaint:  Chief Complaint   Anxiety; Depression; Stress     HISTORY/CURRENT STATUS: HPI For routine med check. Not doing well.    Is under a lot of stress. She was hospitalized last month for sepsis and rhabdomyolysis. Fainted in the bathroom and was there nearly 24 hours before she was found. Had UTI but o/w no infection. Per d/c summary:  decreased Xanax from 1 mg to 0.5 mg 3 times daily as needed.  We have increased BuSpar from 5 to 7.5 mg 3 times daily.  Will resume his Zoloft at 100 mg daily with a plan to increase by 100 mg weekly until she is back to her home 300 mg daily.   Husband is in NH and she thinks he's going to die. It seems like he's given up. Very worried about him and what is going to happen.   Has low energy and motivation, partly from being so sick but also mentally doesn't feel like doing anything. Appetite is nl and wt stable. Hygiene is nl. Sleeps fairly well. Anxiety is still high. Xanax at the lower dose is helping. Didn't restart Zoloft, wanted to check with me to see if that's best. Cries easily. No change in memory.  Denies suicidal or homicidal thoughts.  Patient denies increased energy with decreased need for sleep, increased talkativeness, racing thoughts, impulsivity or risky behaviors, increased spending, increased libido, grandiosity, increased irritability or anger, paranoia, or hallucinations.  Denies dizziness, syncope, seizures, numbness, tingling, tremor, tics, unsteady gait, slurred speech, confusion. Denies muscle or joint pain, stiffness, or dystonia.  Individual Medical History/ Review of Systems: Changes? :Yes  She passed out in the BR on 07/13/2023. Was in there almost 24 hours before being found.   Past medications for mental health diagnoses include: Wellbutrin, Xanax, Zoloft,  trazodone, gabapentin, BuSpar, Ativan, Klonopin  Allergies: Ibuprofen and Clonazepam  Current Medications:  Current Outpatient Medications:    acetaminophen (TYLENOL) 500 MG tablet, Take 1,000 mg by mouth every 6 (six) hours as needed (for pain OR back pain). , Disp: , Rfl:    albuterol (VENTOLIN HFA) 108 (90 Base) MCG/ACT inhaler, Inhale 2 puffs into the lungs every 6 (six) hours as needed for wheezing or shortness of breath., Disp: 8 g, Rfl: 5   OZEMPIC, 1 MG/DOSE, 4 MG/3ML SOPN, Inject 1 mg into the skin once a week., Disp: , Rfl:    pantoprazole (PROTONIX) 40 MG tablet, TAKE 1 TABLET(40 MG) BY MOUTH DAILY, Disp: 30 tablet, Rfl: 11   ALPRAZolam (XANAX) 0.5 MG tablet, Take 1 tablet (0.5 mg total) by mouth 3 (three) times daily as needed for anxiety., Disp: 90 tablet, Rfl: 1   atorvastatin (LIPITOR) 10 MG tablet, TAKE 1 TABLET(10 MG) BY MOUTH DAILY (Patient not taking: Reported on 08/11/2023), Disp: 90 tablet, Rfl: 3   busPIRone (BUSPAR) 7.5 MG tablet, Take 1 tablet (7.5 mg total) by mouth 3 (three) times daily. (Patient not taking: Reported on 08/11/2023), Disp: 90 tablet, Rfl: 1   gabapentin (NEURONTIN) 100 MG capsule, TAKE 1 CAPSULE(100 MG) BY MOUTH THREE TIMES DAILY (Patient not taking: Reported on 08/11/2023), Disp: 90 capsule, Rfl: 3   mirabegron ER (MYRBETRIQ) 25 MG TB24 tablet, Take 1 tablet (25 mg total) by mouth daily. (Patient not taking: Reported on 08/11/2023), Disp: 30 tablet, Rfl: 3   sertraline (ZOLOFT) 100 MG tablet,  1/2 po every day for 2 weeks then 1 po every day., Disp: 30 tablet, Rfl: 1   traZODone (DESYREL) 150 MG tablet, Take 1 tablet (150 mg total) by mouth at bedtime., Disp: 90 tablet, Rfl: 1 Medication Side Effects: none  Family Medical/ Social History: Changes? Yes.  Husband is in the nursing home, had gangrene. He's been there 4 months. She feels like he's dying.   MENTAL HEALTH EXAM:  There were no vitals taken for this visit.There is no height or weight on file to  calculate BMI.  General Appearance: Casual, Well Groomed, and Obese  Eye Contact:  Good  Speech:  Clear and Coherent and Normal Rate  Volume:  Normal  Mood:  Euthymic  Affect:  Congruent  Thought Process:  Goal Directed and Descriptions of Associations: Circumstantial  Orientation:  Full (Time, Place, and Person)  Thought Content: Logical   Suicidal Thoughts:  No  Homicidal Thoughts:  No  Memory:  WNL  Judgement:  Good  Insight:  Good  Psychomotor Activity:  Normal  Concentration:  Concentration: Good  Recall:  Good  Fund of Knowledge: Good  Language: Good  Assets:  Desire for Improvement Financial Resources/Insurance Housing Transportation  ADL's:  Intact  Cognition: WNL  Prognosis:  Good   Reviewed hospital records  DIAGNOSES:    ICD-10-CM   1. Major depressive disorder, recurrent episode, moderate (HCC)  F33.1     2. Generalized anxiety disorder  F41.1     3. Insomnia, unspecified type  G47.00     4. Hospitalization within last 30 days  Z92.89     5. Stress due to illness of family member  Z63.79       Receiving Psychotherapy: No   RECOMMENDATIONS:  PDMP reviewed.  Xanax filled 07/17/2023. I provided 35 minutes of face to face time during this encounter, including time spent before and after the visit in records review, medical decision making, counseling pertinent to today's visit, and charting.   We discussed the recent hospitalization. I'm sorry to hear about her illness and also her husbands.   Recommend restarting Zoloft for anx/dep. Ok to stay off Buspar for now.   Continue Xanax 0.5 mg, 1 po tid prn. Restart Zoloft 100 mg, 1/2 daily for 2 weeks, then 1 po every day. Continue Trazodone 150 mg, 1 qhs prn.  Recommend therapy. Return in 6 wks.   Melony Overly, PA-C

## 2023-08-14 NOTE — Progress Notes (Signed)
Internal Medicine Clinic Attending  Case discussed with the resident at the time of the visit.  We reviewed the resident's history and exam and pertinent patient test results.  I agree with the assessment, diagnosis, and plan of care documented in the resident's note.  

## 2023-08-17 ENCOUNTER — Encounter: Payer: Self-pay | Admitting: Student

## 2023-08-17 NOTE — Progress Notes (Signed)
Results of normal CMP with just slightly elevated alkaline phosphatase mailed to patient.  Alkaline phosphatase is not elevated to a clinical significance.

## 2023-08-21 ENCOUNTER — Ambulatory Visit (HOSPITAL_COMMUNITY)
Admission: EM | Admit: 2023-08-21 | Discharge: 2023-08-21 | Disposition: A | Discharge status: Home / Self Care | Payer: Medicare HMO | Attending: Family Medicine | Admitting: Family Medicine

## 2023-08-21 ENCOUNTER — Encounter (HOSPITAL_COMMUNITY): Payer: Self-pay | Admitting: Emergency Medicine

## 2023-08-21 DIAGNOSIS — J441 Chronic obstructive pulmonary disease with (acute) exacerbation: Secondary | ICD-10-CM | POA: Diagnosis not present

## 2023-08-21 DIAGNOSIS — Z1152 Encounter for screening for COVID-19: Secondary | ICD-10-CM | POA: Diagnosis not present

## 2023-08-21 LAB — POCT INFLUENZA A/B
Influenza A, POC: NEGATIVE
Influenza B, POC: NEGATIVE

## 2023-08-21 LAB — SARS CORONAVIRUS 2 (TAT 6-24 HRS): SARS Coronavirus 2: NEGATIVE

## 2023-08-21 MED ORDER — AZITHROMYCIN 250 MG PO TABS: 250.0000 mg | ORAL_TABLET | Freq: Every day | ORAL | 0 refills | Status: DC

## 2023-08-21 MED ORDER — PREDNISONE 20 MG PO TABS: 40.0000 mg | ORAL_TABLET | Freq: Every day | ORAL | 0 refills | Status: AC

## 2023-08-21 NOTE — Discharge Instructions (Addendum)
Please take your steroid as directed  Please take your antibiotic  Please follow-up with [place] if you do not feel any better by the end of the week.

## 2023-08-21 NOTE — ED Provider Notes (Signed)
[facility]    CSN: [id number] Arrival date & time: 08/21/23  4401      History   Chief Complaint Chief Complaint  Patient presents with   Cough   Chills   Generalized Body Aches    HPI [name] [name] is a 67 y.o. female.   Is presenting with 1 week history of cough, chills, productive sputum that has slowly changed over time.  Patient does have a history of COPD.  Patient states that she has been feeling achy for about a week now and wanted to get checked out.  Patient does not feel short of breath.  Patient has no fevers that she can recommend but does note muscle aches as well as chills.   Cough   Past Medical History:  Diagnosis Date   Anxiety    Asthma    Chronic, with restrictive component 2/2 obesity   COPD (chronic obstructive pulmonary disease) (HCC)    Depression    GERD (gastroesophageal reflux disease)    Headache    Insomnia    Obesity    On home oxygen therapy    "3L at night only when I get sick" (04/30/2014)   Shortness of breath dyspnea    Sleep apnea    "couldn't afford mask so I didn't get it" (04/30/2014)    Patient Active Problem List   Diagnosis Date Noted   Sepsis due to gram-negative UTI (HCC) 07/17/2023   Polypharmacy 07/17/2023   Traumatic rhabdomyolysis (HCC) 07/17/2023   Elevated liver enzymes 07/17/2023   Anxiety state 07/17/2023   Urinary incontinence 04/14/2023   Falls 04/13/2023   Thrombocytopenia (HCC) 04/13/2023   Bacteremia due to Escherichia coli 02/07/2023   Osteopenia 09/16/2022   Pre-diabetes 08/01/2022   Posterior right knee pain 02/06/2019   OCD (obsessive compulsive disorder) 08/13/2018   Aortic atherosclerosis (HCC) 08/17/2016   Obesity hypoventilation syndrome (HCC) 01/21/2015   Healthcare maintenance 07/01/2014   OSA (obstructive sleep apnea) 04/04/2013   Anxiety disorder 07/02/2012   Depression 06/28/2012   Morbid obesity (HCC) 03/02/2012   GERD (gastroesophageal reflux disease)  02/05/2012   Smoking 02/05/2012    Past Surgical History:  Procedure Laterality Date   APPENDECTOMY  ~ 2002   VAGINAL HYSTERECTOMY  ~ 1978    OB History   No obstetric history on file.      Home Medications    Prior to Admission medications   Medication Sig Start Date End Date Taking? Authorizing Provider  acetaminophen (TYLENOL) 500 MG tablet Take 1,000 mg by mouth every 6 (six) hours as needed (for pain OR back pain).     [provider]  albuterol (VENTOLIN HFA) 108 (90 Base) MCG/ACT inhaler Inhale 2 puffs into the lungs every 6 (six) hours as needed for wheezing or shortness of breath. 06/16/20   Remo Lipps, MD  ALPRAZolam Prudy Feeler) 0.5 MG tablet Take 1 tablet (0.5 mg total) by mouth 3 (three) times daily as needed for anxiety. 08/11/23   [name], PA-C  atorvastatin (LIPITOR) 10 MG tablet TAKE 1 TABLET(10 MG) BY MOUTH DAILY Patient not taking: Reported on 08/11/2023 04/12/23   [name], [name], DO  busPIRone (BUSPAR) 7.5 MG tablet Take 1 tablet (7.5 mg total) by mouth 3 (three) times daily. Patient not taking: Reported on 08/11/2023 07/17/23   Almon Hercules, MD  gabapentin (NEURONTIN) 100 MG capsule TAKE 1 CAPSULE(100 MG) BY MOUTH THREE TIMES DAILY Patient not taking: Reported on 08/11/2023 03/14/22   Vivi Barrack,  DPM  mirabegron ER (MYRBETRIQ) 25 MG TB24 tablet Take 1 tablet (25 mg total) by mouth daily. Patient not taking: Reported on 08/11/2023 08/07/23   Rocky Morel, DO  OZEMPIC, 1 MG/DOSE, 4 MG/3ML SOPN Inject 1 mg into the skin once a week. 05/24/23   [provider]  pantoprazole (PROTONIX) 40 MG tablet TAKE 1 TABLET(40 MG) BY MOUTH DAILY 04/12/23   [name], [name], DO  sertraline (ZOLOFT) 100 MG tablet 1/2 po every day for 2 weeks then 1 po every day. 08/11/23   Cherie Ouch, PA-C  traZODone (DESYREL) 150 MG tablet Take 1 tablet (150 mg total) by mouth at bedtime. 08/11/23   Cherie Ouch, PA-C    Family History Family History  Problem  Relation Age of Onset   Breast cancer Mother    Alcohol abuse Mother    Emphysema Mother        smoked   Asthma Mother    Breast cancer Maternal Aunt    Breast cancer Maternal Grandmother     Social History Social History   Tobacco Use   Smoking status: Former    Current packs/day: 0.00    Types: Cigarettes    Quit date: 11/07/1968    Years since quitting: 54.8   Smokeless tobacco: Never  Vaping Use   Vaping status: Never Used  Substance Use Topics   Alcohol use: No    Alcohol/week: 0.0 standard drinks of alcohol   Drug use: No     Allergies   Ibuprofen and Clonazepam   Review of Systems Review of Systems  Respiratory:  Positive for cough.      Physical Exam Triage Vital Signs ED Triage Vitals  Encounter Vitals Group     BP 08/21/23 0918 117/76     Systolic BP Percentile --      Diastolic BP Percentile --      Pulse Rate 08/21/23 0918 73     Resp 08/21/23 0918 16     Temp 08/21/23 0918 (!) 97.4 F (36.3 C)     Temp Source 08/21/23 0918 Oral     SpO2 08/21/23 0918 93 %     Weight --      Height --      Head Circumference --      Peak Flow --      Pain Score 08/21/23 0916 6     Pain Loc --      Pain Education --      Exclude from Growth Chart --    No data found.  Updated Vital Signs BP 117/76 (BP Location: Right Arm)   Pulse 73   Temp (!) 97.4 F (36.3 C) (Oral)   Resp 16   SpO2 93%   Visual Acuity Right Eye Distance:   Left Eye Distance:   Bilateral Distance:    Right Eye Near:   Left Eye Near:    Bilateral Near:     Physical Exam Constitutional:      General: She is not in acute distress.    Appearance: Normal appearance.  HENT:     Nose: Nose normal. No congestion.     Mouth/Throat:     Mouth: Mucous membranes are dry.     Pharynx: No oropharyngeal exudate.  Cardiovascular:     Rate and Rhythm: Normal rate and regular rhythm.     Pulses: Normal pulses.     Heart sounds: Normal heart sounds.  Pulmonary:     Effort: Pulmonary  effort is normal.  No respiratory distress.     Breath sounds: No stridor. Wheezing present. No rhonchi or rales.     Comments: Patient with noted expiratory wheeze on exam Chest:     Chest wall: No tenderness.  Neurological:     Mental Status: She is alert.      UC Treatments / Results  Labs (all labs ordered are listed, but only abnormal results are displayed) Labs Reviewed  SARS CORONAVIRUS 2 (TAT 6-24 HRS)  POCT INFLUENZA A/B    EKG   Radiology No results found.  Procedures Procedures (including critical care time)  Medications Ordered in UC Medications - No data to display  Initial Impression / Assessment and Plan / UC Course  I have reviewed the triage vital signs and the nursing notes.  Pertinent labs & imaging results that were available during my care of the patient were reviewed by me and considered in my medical decision making (see chart for details).     Patient with known history of COPD presenting with flulike symptoms.  Saturation is approximately 93% which is appropriate given COPD history.  Patient does have noted expiratory wheeze on exam.  Flu is negative, COVID is pending.  At this time we will give patient a steroid given patient is a high risk for COPD exacerbation.  Patient will also be given antibiotic which she was advised to take if her symptoms/sputum production continue to increase.  Patient was advised to evaluate herself again on Friday and to take the medication if necessary at that time.  Patient is understanding and agreeable with plan. Final Clinical Impressions(s) / UC Diagnoses   Final diagnoses:  COPD exacerbation [facility])     Discharge Instructions      Please take your steroid as directed  Please take your antibiotic  Please follow-up with [place] if you do not feel any better by the end of the week.     ED Prescriptions   None    PDMP not reviewed this encounter.   Brenton Grills, MD 08/21/23 [phone]

## 2023-08-21 NOTE — ED Triage Notes (Signed)
Pt c/o cough, chills, body aches, and mucous that started about 9 days ago.

## 2023-08-22 ENCOUNTER — Other Ambulatory Visit: Payer: Self-pay | Admitting: Physician Assistant

## 2023-08-23 ENCOUNTER — Other Ambulatory Visit: Payer: Self-pay | Admitting: Internal Medicine

## 2023-08-23 DIAGNOSIS — E66813 Obesity, class 3: Secondary | ICD-10-CM

## 2023-08-24 ENCOUNTER — Other Ambulatory Visit: Payer: Self-pay | Admitting: Internal Medicine

## 2023-08-24 MED ORDER — OZEMPIC (1 MG/DOSE) 4 MG/3ML ~~LOC~~ SOPN
1.0000 mg | PEN_INJECTOR | SUBCUTANEOUS | 0 refills | Status: DC
Start: 2023-08-24 — End: 2024-01-08

## 2023-08-24 NOTE — Progress Notes (Signed)
Ozempic 1 mg refill sent

## 2023-08-24 NOTE — Telephone Encounter (Signed)
Incorrect dose requested. Refill hx reflects she is receiving 1 mg dose. Will refuse this order and order 1 mg dose

## 2023-09-25 ENCOUNTER — Ambulatory Visit (INDEPENDENT_AMBULATORY_CARE_PROVIDER_SITE_OTHER): Payer: Medicare HMO | Admitting: Physician Assistant

## 2023-10-18 ENCOUNTER — Other Ambulatory Visit: Payer: Self-pay | Admitting: Physician Assistant

## 2023-10-18 ENCOUNTER — Other Ambulatory Visit: Payer: Self-pay

## 2023-10-18 MED ORDER — ALPRAZOLAM 0.5 MG PO TABS: 0.5000 mg | ORAL_TABLET | Freq: Three times a day (TID) | ORAL | 0 refills | Status: DC | PRN

## 2023-10-18 NOTE — Telephone Encounter (Signed)
My last note in Oct shows 0.5 mg. Please re-pend as 0.5 mg, #90, no RF. Thanks

## 2023-10-18 NOTE — Telephone Encounter (Signed)
Lf 10/16

## 2023-10-30 ENCOUNTER — Other Ambulatory Visit: Payer: Self-pay | Admitting: Physician Assistant

## 2023-11-06 ENCOUNTER — Encounter: Payer: Self-pay | Admitting: Physician Assistant

## 2023-11-06 ENCOUNTER — Ambulatory Visit (INDEPENDENT_AMBULATORY_CARE_PROVIDER_SITE_OTHER): Payer: Medicare HMO | Admitting: Physician Assistant

## 2023-11-06 DIAGNOSIS — F3341 Major depressive disorder, recurrent, in partial remission: Secondary | ICD-10-CM

## 2023-11-06 DIAGNOSIS — G4700 Insomnia, unspecified: Secondary | ICD-10-CM | POA: Diagnosis not present

## 2023-11-06 DIAGNOSIS — Z6379 Other stressful life events affecting family and household: Secondary | ICD-10-CM | POA: Diagnosis not present

## 2023-11-06 DIAGNOSIS — F411 Generalized anxiety disorder: Secondary | ICD-10-CM | POA: Diagnosis not present

## 2023-11-06 MED ORDER — SERTRALINE HCL 100 MG PO TABS: 100.0000 mg | ORAL_TABLET | Freq: Every day | ORAL | 1 refills | Status: DC

## 2023-11-06 MED ORDER — GABAPENTIN 400 MG PO CAPS: 400.0000 mg | ORAL_CAPSULE | Freq: Two times a day (BID) | ORAL | 1 refills | Status: DC

## 2023-11-06 MED ORDER — ALPRAZOLAM 1 MG PO TABS: 1.0000 mg | ORAL_TABLET | Freq: Two times a day (BID) | ORAL | 5 refills | Status: DC | PRN

## 2023-11-06 NOTE — Progress Notes (Signed)
[facility]  Patient ID: [name] [name],  MRN: [id number]  PCP: Rudene Christians, DO  Date of Evaluation: 11/06/2023 Time spent:25 minutes  Chief Complaint:  Chief Complaint   Anxiety; Depression; Insomnia; Follow-up    HISTORY/CURRENT STATUS: HPI For routine med check.  Under a lot of stress with husband's illness, see Social history. He's not helping himself and that's really frustrating. Her  hurts a lot from having to move him around.  He's probably close to 400 #. She's been taking gabapentin for that.  It helps some.  She's very anxious too. We decreased the xanax dose and had to take it more frequently but that didn't work as much as the 1 mg dose. Not having PA but gets so overwhelmed she can't stand it.  Energy and motivation are fair to good.  No extreme sadness, tearfulness, or feelings of hopelessness.  Sleeps well most of the time. ADLs and personal hygiene are normal.   Denies any changes in concentration, making decisions, or remembering things.  Appetite has not changed.  Weight is stable.  Denies suicidal or homicidal thoughts.  Patient denies increased energy with decreased need for sleep, increased talkativeness, racing thoughts, impulsivity or risky behaviors, increased spending, increased libido, grandiosity, increased irritability or anger, paranoia, or hallucinations.  Review of Systems  Constitutional: Negative.   HENT: Negative.    Eyes: Negative.   Respiratory: Negative.    Cardiovascular: Negative.   Gastrointestinal: Negative.   Genitourinary: Negative.   Musculoskeletal: Negative.   Skin: Negative.   Neurological: Negative.   Endo/Heme/Allergies: Negative.   Psychiatric/Behavioral:         See HPI   Individual Medical History/ Review of Systems: Changes? :No    Past medications for mental health diagnoses include: Wellbutrin, Xanax, Zoloft, trazodone, gabapentin, BuSpar, Ativan, Klonopin  Allergies: Ibuprofen and  Clonazepam  Current Medications:  Current Outpatient Medications:    acetaminophen (TYLENOL) 500 MG tablet, Take 1,000 mg by mouth every 6 (six) hours as needed (for pain OR back pain). , Disp: , Rfl:    albuterol (VENTOLIN HFA) 108 (90 Base) MCG/ACT inhaler, Inhale 2 puffs into the lungs every 6 (six) hours as needed for wheezing or shortness of breath., Disp: 8 g, Rfl: 5   gabapentin (NEURONTIN) 400 MG capsule, Take 1 capsule (400 mg total) by mouth 2 (two) times daily., Disp: 60 capsule, Rfl: 1   mirabegron ER (MYRBETRIQ) 25 MG TB24 tablet, Take 1 tablet (25 mg total) by mouth daily., Disp: 30 tablet, Rfl: 3   OZEMPIC, 1 MG/DOSE, 4 MG/3ML SOPN, Inject 1 mg into the skin once a week., Disp: 9 mL, Rfl: 0   pantoprazole (PROTONIX) 40 MG tablet, TAKE 1 TABLET(40 MG) BY MOUTH DAILY, Disp: 30 tablet, Rfl: 11   sertraline (ZOLOFT) 100 MG tablet, TAKE 1 TABLET BY MOUTH EVERY DAY, Disp: 30 tablet, Rfl: 0   traZODone (DESYREL) 150 MG tablet, Take 1 tablet (150 mg total) by mouth at bedtime., Disp: 90 tablet, Rfl: 1   atorvastatin (LIPITOR) 10 MG tablet, TAKE 1 TABLET(10 MG) BY MOUTH DAILY (Patient not taking: Reported on 08/11/2023), Disp: 90 tablet, Rfl: 3 Medication Side Effects: none  Family Medical/ Social History: Changes? Yes.  Husband is now home. She is primary care giver and it's been hard physcially and emotionally.  MENTAL HEALTH EXAM:  There were no vitals taken for this visit.There is no height or weight on file to calculate BMI.  General Appearance: Casual, Well Groomed, and Obese  Eye Contact:  Good  Speech:  Clear and Coherent and Normal Rate  Volume:  Normal  Mood:  Euthymic  Affect:  Congruent  Thought Process:  Goal Directed and Descriptions of Associations: Circumstantial  Orientation:  Full (Time, Place, and Person)  Thought Content: Logical   Suicidal Thoughts:  No  Homicidal Thoughts:  No  Memory:  WNL  Judgement:  Good  Insight:  Good  Psychomotor Activity:   walks  slowly with a 4 pronged cane  Concentration:  Concentration: Good  Recall:  Good  Fund of Knowledge: Good  Language: Good  Assets:  Desire for Improvement Financial Resources/Insurance Housing Transportation  ADL's:  Intact  Cognition: WNL  Prognosis:  Good    DIAGNOSES:    ICD-10-CM   1. Recurrent major depression in partial remission (HCC)  F33.41     2. Generalized anxiety disorder  F41.1     3. Insomnia, unspecified type  G47.00     4. Stress due to illness of family member  Z63.79      Receiving Psychotherapy: No   RECOMMENDATIONS:  PDMP reviewed.  Xanax filled 10/18/2023. I provided 25 minutes of face to face time during this encounter, including time spent before and after the visit in records review, medical decision making, counseling pertinent to today's visit, and charting.   She's doing well as far as her meds go. I recommend increasing the  gabapentin, It'll help the anxiety and the back pain. Recommend she see PCP about the back pain.   Increase Xanax 1.0 mg, 1 po bid prn. Increase gabapentin to 400 mg, 1 p.o. twice daily as needed. Continue Zoloft 100 mg,  1 po every day. Continue Trazodone 150 mg, 1 qhs prn.  Recommend therapy. Return in 3 months.   Melony Overly, PA-C
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
## 2023-11-23 ENCOUNTER — Ambulatory Visit: Payer: Medicare HMO | Admitting: Internal Medicine

## 2023-11-23 VITALS — BP 137/68 | HR 77 | Temp 97.8°F | Ht 68.0 in | Wt 209.8 lb

## 2023-11-23 DIAGNOSIS — E119 Type 2 diabetes mellitus without complications: Secondary | ICD-10-CM

## 2023-11-23 DIAGNOSIS — S29012D Strain of muscle and tendon of back wall of thorax, subsequent encounter: Secondary | ICD-10-CM

## 2023-11-23 DIAGNOSIS — I700 Atherosclerosis of aorta: Secondary | ICD-10-CM

## 2023-11-23 DIAGNOSIS — S29012A Strain of muscle and tendon of back wall of thorax, initial encounter: Secondary | ICD-10-CM | POA: Insufficient documentation

## 2023-11-23 DIAGNOSIS — R7303 Prediabetes: Secondary | ICD-10-CM | POA: Diagnosis not present

## 2023-11-23 LAB — POCT GLYCOSYLATED HEMOGLOBIN (HGB A1C): Hemoglobin A1C: 5.1 % (ref 4.0–5.6)

## 2023-11-23 LAB — GLUCOSE, CAPILLARY: Glucose-Capillary: 96 mg/dL (ref 70–99)

## 2023-11-23 MED ORDER — LIDOCAINE 5 % EX PTCH: 1.0000 | MEDICATED_PATCH | Freq: Two times a day (BID) | CUTANEOUS | 0 refills | Status: DC

## 2023-11-23 MED ORDER — DICLOFENAC SODIUM 1 % EX GEL
2.0000 g | Freq: Four times a day (QID) | CUTANEOUS | 0 refills | Status: DC
Start: 2023-11-23 — End: 2024-01-29

## 2023-11-23 NOTE — Patient Instructions (Addendum)
Thank you, Ms.Vallerie Toigo Sheppard for allowing [place] to provide your care today.   Back pain It seems like you have a muscle strain of your left back muscle.  I am referring you to physical therapy for TENS therapy.  They also have some other tools that would be helpful for you.  I am worried with you having to help your husband transition positions that you are at risk for this for happening and they can help strengthen your back muscles.  I have sent in lidocaine patches and Voltaren gel to use as needed  Please stop taking tramadol and methocarbamol as these place you at higher risk for falls and polypharmacy.  Your hemoglobin A1c was well-controlled at 5.1.  We will continue to check this yearly with your history of prediabetes.  I have ordered the following labs for you:  Lab Orders         Glucose, capillary         POC Hbg A1C      Referrals ordered today:   Referral Orders         Ambulatory referral to Physical Therapy       I have ordered the following medication/changed the following medications:   Stop the following medications: There are no discontinued medications.   Start the following medications: Meds ordered this encounter  Medications   lidocaine (LIDODERM) 5 %    Sig: Place 1 patch onto the skin every 12 (twelve) hours. Remove & Discard patch within 12 hours or as directed by MD    Dispense:  10 patch    Refill:  0   diclofenac Sodium (VOLTAREN) 1 % GEL    Sig: Apply 2 g topically 4 (four) times daily.    Dispense:  2 g    Refill:  0     We look forward to seeing you next time. Please call our clinic at [phone] if you have any questions or concerns. The best time to call is Monday-Friday from 9am-4pm, but there is someone available 24/7. If after hours or the weekend, call the main hospital number and ask for the Internal Medicine Resident On-Call. If you need medication refills, please notify your pharmacy one week in advance and they will send [place] a  request.   Thank you for trusting me with your care. Wishing you the best!   Rudene Christians, DO [facility]

## 2023-11-23 NOTE — Assessment & Plan Note (Signed)
She does not recall a specific injury to her back but reports this is been there for the last 15 years with certain activities.  It sounds like with her husband being home and being his primary caregiver that pain has flared up in the last several months.  I am concerned about medications in a patient with recurrent falls and polypharmacy.  She is also not found tramadol or muscle relaxers to help with the pain. P: Referral to physical therapy for iontophoresis and TENS unit.  I do think she would benefit from an at home TENS unit if possible. Lidocaine patches sent Voltaren gel sent I talked with her about recommendation to not take tramadol or muscle relaxers with concern for polypharmacy

## 2023-11-23 NOTE — Assessment & Plan Note (Signed)
Lab Results  Component Value Date   HGBA1C 5.1 11/23/2023   A1c well-controlled.  Can plan to recheck A1c in a year

## 2023-11-29 NOTE — Addendum Note (Signed)
Addended by: Dickie La on: 11/29/2023 10:18 AM   Modules accepted: Level of Service

## 2023-11-29 NOTE — Progress Notes (Signed)
 [facility] Attending  Case discussed with the resident at the time of the visit.  We reviewed the resident's history and exam and pertinent patient test results.  I agree with the assessment, diagnosis, and plan of care documented in the resident's note.

## 2023-12-02 ENCOUNTER — Other Ambulatory Visit: Payer: Self-pay | Admitting: Physician Assistant

## 2023-12-05 ENCOUNTER — Ambulatory Visit: Payer: Medicare HMO | Admitting: Rehabilitative and Restorative Service Providers"

## 2023-12-05 ENCOUNTER — Encounter: Payer: Self-pay | Admitting: Rehabilitative and Restorative Service Providers"

## 2023-12-05 DIAGNOSIS — M546 Pain in thoracic spine: Secondary | ICD-10-CM

## 2023-12-05 DIAGNOSIS — M6281 Muscle weakness (generalized): Secondary | ICD-10-CM | POA: Diagnosis not present

## 2023-12-05 NOTE — Therapy (Addendum)
 OUTPATIENT PHYSICAL THERAPY EVALUATION / DISCHARGE   Patient Name: [name] [name] MRN: [id number] DOB:May 11, 1956, 68 y.o., female Today's Date: 12/05/2023  END OF SESSION:  PT End of Session - 12/05/23 1300     Visit Number 1    Number of Visits 24    Date for PT Re-Evaluation 02/27/24    Authorization Type [employer] $25 copay    Progress Note Due on Visit 10    PT Start Time 1310    PT Stop Time 1338    PT Time Calculation (min) 28 min    Activity Tolerance Patient limited by pain    Behavior During Therapy WFL for tasks assessed/performed             Past Medical History:  Diagnosis Date   Anxiety    Asthma    Chronic, with restrictive component 2/2 obesity   COPD (chronic obstructive pulmonary disease) (HCC)    Depression    GERD (gastroesophageal reflux disease)    Headache    Insomnia    Obesity    On home oxygen therapy    "3L at night only when I get sick" (04/30/2014)   Shortness of breath dyspnea    Sleep apnea    "couldn't afford mask so I didn't get it" (04/30/2014)   Past Surgical History:  Procedure Laterality Date   APPENDECTOMY  ~ 2002   VAGINAL HYSTERECTOMY  ~ 1978   Patient Active Problem List   Diagnosis Date Noted   Strain of left latissimus dorsi muscle 11/23/2023   Sepsis due to gram-negative UTI (HCC) 07/17/2023   Polypharmacy 07/17/2023   Traumatic rhabdomyolysis (HCC) 07/17/2023   Elevated liver enzymes 07/17/2023   Anxiety state 07/17/2023   Urinary incontinence 04/14/2023   Falls 04/13/2023   Thrombocytopenia (HCC) 04/13/2023   Bacteremia due to Escherichia coli 02/07/2023   Osteopenia 09/16/2022   Pre-diabetes 08/01/2022   Posterior right knee pain 02/06/2019   OCD (obsessive compulsive disorder) 08/13/2018   Aortic atherosclerosis (HCC) 08/17/2016   Obesity hypoventilation syndrome (HCC) 01/21/2015   Healthcare maintenance 07/01/2014   OSA (obstructive sleep apnea) 04/04/2013   Anxiety disorder 07/02/2012    Depression 06/28/2012   Morbid obesity (HCC) 03/02/2012   GERD (gastroesophageal reflux disease) 02/05/2012   Smoking 02/05/2012    PCP: Rudene Christians DO  REFERRING PROVIDER: Dickie La, MD  REFERRING DIAG: S29.012A (ICD-10-CM) - Strain of left latissimus dorsi muscle  THERAPY DIAG:  Pain in thoracic spine  Muscle weakness (generalized)  Rationale for Evaluation and Treatment: Rehabilitation  ONSET DATE: 6-7 months (fall 2024)  SUBJECTIVE:  SUBJECTIVE STATEMENT: Chronic complaints reported from Lt scapular/thoracic region.  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  Has seen chiro for condition "years ago".  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  Pt indicated last 6-7 months having to help caregive for husband.  Pt indicated when"tensed up", it starts hurting.  Pt indicated complaints with reaching up high, bending over, lifting.   Pt indicated using patches that don't seem to help.  Pt indicated husband 435 lbs.  Pt indicated quad cane in Rt hand to help Lt knee (asked about MD for injection - referred to possible ortho MD visit for those questions).   Denied arm symptoms.   PERTINENT HISTORY: Anxiety, COPD, asthma, GERD, depression,Lt knee pain  PAIN:  NPRS scale: 7/10 current, 10/10.  0/10 at best.  Pain location: Lt scapular/thoracic pain Pain description: tightness/burning.   Aggravating factors: reaching up high, bending over, lifting. Relieving factors: nothing specific   PRECAUTIONS: None  WEIGHT BEARING RESTRICTIONS: No  FALLS:  Has patient fallen in last 6 months? Yes 1 in bathtub when passed out.  Pt indicated having fear of falling.   LIVING ENVIRONMENT: Lives in: House/apartment Stairs: has ramp out back Has following equipment at home: quad cane,  has walker  OCCUPATION: No work indicated.   PLOF: Independent, indicated caregiver for husband that requires dressing, mobility help.   Reading/bird watching.     PATIENT GOALS: Reduce pain.  OBJECTIVE:   PATIENT SURVEYS:  12/05/2023 Patient specific functional scale (0 unable to 10 no difficulty) 1) Reaching down to floor to help change husband 2/10 (severe trouble) 2) Lifting/carry wash pan with water  2/10 (severe trouble)  COGNITION: 12/05/2023 Overall cognitive status: WFL     SENSATION: 12/05/2023 Not tested  POSTURE: 12/05/2023 Rounded shoulders, protracted shoudlers, increased thoracic kyphosis with forward head in sitting/standing.   CERVICAL ROM No complaint  THORACIC ROM 12/05/2023: Extension: < 25 % WFL Flexion: 50% to lap  Lt rotation 52 deg c pian Rt rotation 65 deg    UPPER EXTREMITY ROM:   ROM Right 12/05/2023 Left 12/05/2023  Shoulder flexion General limit in upright reach General limit in upright reach  Shoulder extension    Shoulder abduction    Shoulder adduction    Shoulder internal rotation    Shoulder external rotation    Elbow flexion    Elbow extension    Wrist flexion    Wrist extension    Wrist ulnar deviation    Wrist radial deviation    Wrist pronation    Wrist supination    (Blank rows = not tested)  UPPER EXTREMITY MMT:  12/05/2023: Held testing today due to time.  General postural weakness noted  MMT Right 12/05/2023 Left 12/05/2023  Shoulder flexion    Shoulder extension    Shoulder abduction    Shoulder adduction    Shoulder internal rotation    Shoulder external rotation    Middle trapezius    Lower trapezius    Elbow flexion    Elbow extension    Wrist flexion    Wrist extension    Wrist ulnar deviation    Wrist radial deviation    Wrist pronation    Wrist supination    Grip strength (lbs)    (Blank rows = not tested)  SPECIAL TESTS: 12/05/2023 No specific special testing today  JOINT MOBILITY  TESTING:  12/05/2023 General upper and mid thoracic passive accessory motion limitations, scapular retraction/upward rotation limits bilateral  PALPATION:  12/05/2023 Lt:  Tenderness inferior and medial aspect of scapular borders and in lower trap/middle trap/rhomboids)                                                                                                                                                                                                  TODAY'S TREATMENT:                                                                                                       DATE: 12/05/2023 Therex:    HEP instruction/performance c cues for techniques, handout provided.  Trial set performed of each for comprehension and symptom assessment.  See below for exercise list   PATIENT EDUCATION: 12/05/2023 Education details: HEP, POC Person educated: Patient Education method: Explanation, Demonstration, Verbal cues, and Handouts Education comprehension: verbalized understanding, returned demonstration, and verbal cues required  HOME EXERCISE PROGRAM: Access Code: [email or web address] URL: [email or web address] Date: 12/05/2023 Prepared by: Chyrel Masson  Exercises - Seated Scapular Retraction  - 3-5 x daily - 7 x weekly - 1 sets - 10-15 reps - 3-5 hold - Seated Thoracic Lumbar Extension  - 3-5 x daily - 7 x weekly - 1 sets - 10-15 reps - 3 hold - Standing Bilateral Low Shoulder Row with Anchored Resistance  - 1-2 x daily - 7 x weekly - 2-3 sets - 10-15 reps  ASSESSMENT:  CLINICAL IMPRESSION: Patient is a 68 y.o. who comes to clinic with complaints of Lt mid thoracic/scapular pain with mobility, strength and movement coordination deficits that impair their ability to perform usual daily and recreational functional activities without increase difficulty/symptoms at this time.  Patient to benefit from skilled PT services to address impairments and limitations to improve to previous level  of function without restriction secondary to condition.   OBJECTIVE IMPAIRMENTS: Abnormal gait, decreased activity tolerance, decreased balance, decreased coordination, decreased endurance, decreased mobility, difficulty walking, decreased ROM, decreased strength, hypomobility, increased fascial restrictions, impaired perceived functional ability, increased muscle spasms, impaired flexibility, impaired UE functional use, improper body mechanics, postural dysfunction, and pain.   ACTIVITY LIMITATIONS: carrying, lifting, bending, sitting, standing, squatting, stairs, transfers, continence, bathing, toileting, dressing, reach over head,  hygiene/grooming, locomotion level, and caring for others  PARTICIPATION LIMITATIONS: meal prep, cleaning, laundry, interpersonal relationship, and community activity  PERSONAL FACTORS: Anxiety, COPD, asthma, GERD, depression, length of time since onset are also affecting patient's functional outcome.   REHAB POTENTIAL: Good  CLINICAL DECISION MAKING: Stable/uncomplicated  EVALUATION COMPLEXITY: Low   GOALS: Goals reviewed with patient? Yes  SHORT TERM GOALS: (target date for Short term goals are 3 weeks 12/26/2023)  1.Patient will demonstrate independent use of home exercise program to maintain progress from in clinic treatments. Goal status: New  LONG TERM GOALS: (target dates for all long term goals are 10 weeks  02/13/2024 )   1. Patient will demonstrate/report pain at worst less than or equal to 2/10 to facilitate minimal limitation in daily activity secondary to pain symptoms. Goal status: New   2. Patient will demonstrate independent use of home exercise program to facilitate ability to maintain/progress functional gains from skilled physical therapy services. Goal status: New   3. Patient will demonstrate patient specific functional scale reporting 7 out of 10 or better to indicate reduced disability due to condition. Goal status: New   4.   Patient will demonstrate thoracic ROM WFL s symptoms for normal mobility in daily life.   Goal status: New   5.  Patient will demonstrate /report ability to perform usual caregiver activity s restriction due to symptoms.     Goal status: New   PLAN:  PT FREQUENCY: 1-2x/week  PT DURATION: 12 weeks  PLANNED INTERVENTIONS: Can include 11914- PT Re-evaluation, 97110-Therapeutic exercises, 97530- Therapeutic activity, 97112- Neuromuscular re-education, 97535- Self Care, 97140- Manual therapy,  [phone]- Aquatic Therapy, 97014- Electrical stimulation (unattended), [id number] Physical performance testing,  [phone]- Traction (mechanical),    Patient/Family education, Balance training, Stair training, Taping, Dry Needling, Joint mobilization, Joint manipulation, Spinal manipulation, Spinal mobilization, Scar mobilization, Vestibular training, Visual/preceptual remediation/compensation, DME instructions, Cryotherapy, and Moist heat.  All performed as medically necessary.  All included unless contraindicated  PLAN FOR NEXT SESSION: Review HEP knowledge/results.   Chyrel Masson, PT, DPT, OCS, ATC 12/05/23  1:42 PM   Referring diagnosis? S29.012A (ICD-10-CM) - Strain of left latissimus dorsi muscle Treatment diagnosis? (if different than referring diagnosis) M54.6 What was this (referring dx) caused by? []  Surgery []  Fall [x]  Ongoing issue []  Arthritis []  Other: ____________  Laterality: []  Rt [x]  Lt []  Both  Check all possible CPT codes:  *CHOOSE 10 OR LESS*    See Planned Interventions listed in the Plan section of the Evaluation.    PHYSICAL THERAPY DISCHARGE SUMMARY  Visits from Start of Care: 1  Current functional level related to goals / functional outcomes: See note   Remaining deficits: See note   Education / Equipment: HEP   Patient goals were not met. Patient is being discharged due to the patient's request. (Reported husband in nursing facility) Chyrel Masson, PT,  DPT, OCS, ATC 12/26/23  1:39 PM

## 2023-12-12 ENCOUNTER — Encounter: Payer: Self-pay | Admitting: Physician Assistant

## 2023-12-12 ENCOUNTER — Ambulatory Visit (INDEPENDENT_AMBULATORY_CARE_PROVIDER_SITE_OTHER): Payer: Medicare HMO | Admitting: Physician Assistant

## 2023-12-12 ENCOUNTER — Other Ambulatory Visit (INDEPENDENT_AMBULATORY_CARE_PROVIDER_SITE_OTHER): Payer: Self-pay

## 2023-12-12 DIAGNOSIS — M1712 Unilateral primary osteoarthritis, left knee: Secondary | ICD-10-CM

## 2023-12-12 DIAGNOSIS — G8929 Other chronic pain: Secondary | ICD-10-CM | POA: Diagnosis not present

## 2023-12-12 DIAGNOSIS — M25562 Pain in left knee: Secondary | ICD-10-CM

## 2023-12-12 MED ORDER — LIDOCAINE HCL 1 % IJ SOLN
3.0000 mL | INTRAMUSCULAR | Status: AC | PRN
Start: 2023-12-12 — End: 2023-12-12
  Administered 2023-12-12: 3 mL

## 2023-12-12 MED ORDER — METHYLPREDNISOLONE ACETATE 40 MG/ML IJ SUSP
40.0000 mg | INTRAMUSCULAR | Status: AC | PRN
  Administered 2023-12-12: 40 mg via INTRA_ARTICULAR

## 2023-12-12 NOTE — Progress Notes (Signed)
 Office Visit Note   Patient: [name] [name]           Date of Birth: 10-21-1956           MRN: [id number] Visit Date: 12/12/2023              Requested by: [name], [place], DO [address] [place],  [place] [address] PCP: Kenn Pagan, DO   Assessment & Plan: Visit Diagnoses:  1. Chronic pain of left knee     Plan: Pleasant 68 year old woman with a history of left knee arthritis.  She was seen elsewhere and periodically gotten injections for this.  I suspect this was Beverley Millman by her description.  She comes in today requesting a steroid injection into her left knee she said it has been quite a while since she has had 1.  Denies any history of injury.  She does have some quad atrophy did go forward and inject her knee without difficulty.  Discussed with her close chain quadricep strengthening exercises that she can easily do at home may follow-up as needed  Follow-Up Instructions: Return if symptoms worsen or fail to improve.   Orders:  Orders Placed This Encounter  Procedures   XR Knee 1-2 Views Left   No orders of the defined types were placed in this encounter.     Procedures: Large Joint Inj: L knee on 12/12/2023 2:03 PM Indications: pain and diagnostic evaluation Details: 25 G 1.5 in needle, anterolateral approach  Arthrogram: No  Medications: 40 mg methylPREDNISolone  acetate 40 MG/ML; 3 mL lidocaine  1 % Outcome: tolerated well, no immediate complications Procedure, treatment alternatives, risks and benefits explained, specific risks discussed. Consent was given by the patient.       Clinical Data: No additional findings.   Subjective: Chief Complaint  Patient presents with   Left Knee - Pain    HPI this is a 68 year old woman with a history of left knee pain.  Has had injections elsewhere in the past has had no new injury but requesting an injection today  Review of Systems  All other systems reviewed and are  negative.    Objective: Vital Signs: There were no vitals taken for this visit.  Physical Exam Constitutional:      Appearance: Normal appearance.  Pulmonary:     Effort: Pulmonary effort is normal.  Skin:    General: Skin is warm and dry.  Neurological:     General: No focal deficit present.     Mental Status: She is alert and oriented to person, place, and time.  Psychiatric:        Mood and Affect: Mood normal.        Behavior: Behavior normal.     Ortho Exam Left knee no erythema no effusion compartments are soft and compressible she is neurovascularly intact she does have some quadricep atrophy.  Some tenderness over the joint line has grinding with range of motion of the patellofemoral joint Specialty Comments:  No specialty comments available.  Imaging: XR Knee 1-2 Views Left Result Date: 12/12/2023 Three-view x-rays of the left knee were obtained today overall well-maintained alignment some sclerotic changes and early degenerative changes no acute fractures    PMFS History: Patient Active Problem List   Diagnosis Date Noted   Strain of left latissimus dorsi muscle 11/23/2023   Sepsis due to gram-negative UTI (HCC) 07/17/2023   Polypharmacy 07/17/2023   Traumatic rhabdomyolysis (HCC) 07/17/2023   Elevated liver enzymes 07/17/2023   Anxiety state  07/17/2023   Urinary incontinence 04/14/2023   Falls 04/13/2023   Thrombocytopenia (HCC) 04/13/2023   Bacteremia due to Escherichia coli 02/07/2023   Osteopenia 09/16/2022   Pre-diabetes 08/01/2022   Posterior right knee pain 02/06/2019   OCD (obsessive compulsive disorder) 08/13/2018   Aortic atherosclerosis (HCC) 08/17/2016   Obesity hypoventilation syndrome (HCC) 01/21/2015   Healthcare maintenance 07/01/2014   OSA (obstructive sleep apnea) 04/04/2013   Anxiety disorder 07/02/2012   Depression 06/28/2012   Morbid obesity (HCC) 03/02/2012   GERD (gastroesophageal reflux disease) 02/05/2012   Smoking  02/05/2012   Past Medical History:  Diagnosis Date   Anxiety    Asthma    Chronic, with restrictive component 2/2 obesity   COPD (chronic obstructive pulmonary disease) (HCC)    Depression    GERD (gastroesophageal reflux disease)    Headache    Insomnia    Obesity    On home oxygen  therapy    3L at night only when I get sick (04/30/2014)   Shortness of breath dyspnea    Sleep apnea    couldn't afford mask so I didn't get it (04/30/2014)    Family History  Problem Relation Age of Onset   Breast cancer Mother    Alcohol abuse Mother    Emphysema Mother        smoked   Asthma Mother    Breast cancer Maternal Aunt    Breast cancer Maternal Grandmother     Past Surgical History:  Procedure Laterality Date   APPENDECTOMY  ~ 2002   VAGINAL HYSTERECTOMY  ~ 1978   Social History   Occupational History   Occupation: Housewife   Tobacco Use   Smoking status: Former    Current packs/day: 0.00    Types: Cigarettes    Quit date: 11/07/1968    Years since quitting: 55.1   Smokeless tobacco: Never  Vaping Use   Vaping status: Never Used  Substance and Sexual Activity   Alcohol use: No    Alcohol/week: 0.0 standard drinks of alcohol   Drug use: No   Sexual activity: Never    Birth control/protection: Post-menopausal

## 2023-12-19 ENCOUNTER — Encounter: Payer: Medicare HMO | Admitting: Rehabilitative and Restorative Service Providers"

## 2023-12-19 NOTE — Therapy (Incomplete)
OUTPATIENT PHYSICAL THERAPY TREATMENT   Patient Name: [name] [name] MRN: [id number] DOB:1956/02/23, 68 y.o., female Today's Date: 12/19/2023  END OF SESSION:    Past Medical History:  Diagnosis Date   Anxiety    Asthma    Chronic, with restrictive component 2/2 obesity   COPD (chronic obstructive pulmonary disease) (HCC)    Depression    GERD (gastroesophageal reflux disease)    Headache    Insomnia    Obesity    On home oxygen therapy    "3L at night only when I get sick" (04/30/2014)   Shortness of breath dyspnea    Sleep apnea    "couldn't afford mask so I didn't get it" (04/30/2014)   Past Surgical History:  Procedure Laterality Date   APPENDECTOMY  ~ 2002   VAGINAL HYSTERECTOMY  ~ 1978   Patient Active Problem List   Diagnosis Date Noted   Unilateral primary osteoarthritis, left knee 12/12/2023   Strain of left latissimus dorsi muscle 11/23/2023   Sepsis due to gram-negative UTI (HCC) 07/17/2023   Polypharmacy 07/17/2023   Traumatic rhabdomyolysis (HCC) 07/17/2023   Elevated liver enzymes 07/17/2023   Anxiety state 07/17/2023   Urinary incontinence 04/14/2023   Falls 04/13/2023   Thrombocytopenia (HCC) 04/13/2023   Bacteremia due to Escherichia coli 02/07/2023   Osteopenia 09/16/2022   Pre-diabetes 08/01/2022   Posterior right knee pain 02/06/2019   OCD (obsessive compulsive disorder) 08/13/2018   Aortic atherosclerosis (HCC) 08/17/2016   Obesity hypoventilation syndrome (HCC) 01/21/2015   Healthcare maintenance 07/01/2014   OSA (obstructive sleep apnea) 04/04/2013   Anxiety disorder 07/02/2012   Depression 06/28/2012   Morbid obesity (HCC) 03/02/2012   GERD (gastroesophageal reflux disease) 02/05/2012   Smoking 02/05/2012    PCP: Rudene Christians DO  REFERRING PROVIDER: Dickie La, MD  REFERRING DIAG: S29.012A (ICD-10-CM) - Strain of left latissimus dorsi muscle  THERAPY DIAG:  No diagnosis found.  Rationale for Evaluation and  Treatment: Rehabilitation  ONSET DATE: 6-7 months (fall 2024)  SUBJECTIVE:                                                                                                                                                                                      SUBJECTIVE STATEMENT: Chronic complaints reported from Lt scapular/thoracic region.  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  Has seen chiro for condition "years ago".  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  Pt indicated last 6-7 months having to help caregive for husband.  Pt indicated when"tensed up", it starts hurting.  Pt indicated complaints with reaching up high, bending over, lifting.   Pt indicated  using patches that don't seem to help.  Pt indicated husband 435 lbs.  Pt indicated quad cane in Rt hand to help Lt knee (asked about MD for injection - referred to possible ortho MD visit for those questions).   Denied arm symptoms.   PERTINENT HISTORY: Anxiety, COPD, asthma, GERD, depression,Lt knee pain  PAIN:  NPRS scale: 7/10 current, 10/10.  0/10 at best.  Pain location: Lt scapular/thoracic pain Pain description: tightness/burning.   Aggravating factors: reaching up high, bending over, lifting. Relieving factors: nothing specific   PRECAUTIONS: None  WEIGHT BEARING RESTRICTIONS: No  FALLS:  Has patient fallen in last 6 months? Yes 1 in bathtub when passed out.  Pt indicated having fear of falling.   LIVING ENVIRONMENT: Lives in: House/apartment Stairs: has ramp out back Has following equipment at home: quad cane, has walker  OCCUPATION: No work indicated.   PLOF: Independent, indicated caregiver for husband that requires dressing, mobility help.   Reading/bird watching.     PATIENT GOALS: Reduce pain.  OBJECTIVE:   PATIENT SURVEYS:  12/05/2023 Patient specific functional scale (0 unable to 10 no difficulty) 1) Reaching down to floor to help change husband 2/10 (severe  trouble) 2) Lifting/carry wash pan with water  2/10 (severe trouble)  COGNITION: 12/05/2023 Overall cognitive status: WFL     SENSATION: 12/05/2023 Not tested  POSTURE: 12/05/2023 Rounded shoulders, protracted shoudlers, increased thoracic kyphosis with forward head in sitting/standing.   CERVICAL ROM No complaint  THORACIC ROM 12/05/2023: Extension: < 25 % WFL Flexion: 50% to lap  Lt rotation 52 deg c pian Rt rotation 65 deg    UPPER EXTREMITY ROM:   ROM Right 12/05/2023 Left 12/05/2023  Shoulder flexion General limit in upright reach General limit in upright reach  Shoulder extension    Shoulder abduction    Shoulder adduction    Shoulder internal rotation    Shoulder external rotation    Elbow flexion    Elbow extension    Wrist flexion    Wrist extension    Wrist ulnar deviation    Wrist radial deviation    Wrist pronation    Wrist supination    (Blank rows = not tested)  UPPER EXTREMITY MMT:  12/05/2023: Held testing today due to time.  General postural weakness noted  MMT Right 12/05/2023 Left 12/05/2023  Shoulder flexion    Shoulder extension    Shoulder abduction    Shoulder adduction    Shoulder internal rotation    Shoulder external rotation    Middle trapezius    Lower trapezius    Elbow flexion    Elbow extension    Wrist flexion    Wrist extension    Wrist ulnar deviation    Wrist radial deviation    Wrist pronation    Wrist supination    Grip strength (lbs)    (Blank rows = not tested)  SPECIAL TESTS: 12/05/2023 No specific special testing today  JOINT MOBILITY TESTING:  12/05/2023 General upper and mid thoracic passive accessory motion limitations, scapular retraction/upward rotation limits bilateral  PALPATION:  12/05/2023 Lt:  Tenderness inferior and medial aspect of scapular borders and in lower trap/middle trap/rhomboids)  TODAY'S TREATMENT:                                                                                                       DATE: 12/19/2023 Therex:      TODAY'S TREATMENT:                                                                                                       DATE: 12/05/2023 Therex:    HEP instruction/performance c cues for techniques, handout provided.  Trial set performed of each for comprehension and symptom assessment.  See below for exercise list   PATIENT EDUCATION: 12/05/2023 Education details: HEP, POC Person educated: Patient Education method: Explanation, Demonstration, Verbal cues, and Handouts Education comprehension: verbalized understanding, returned demonstration, and verbal cues required  HOME EXERCISE PROGRAM: Access Code: [email or web address] URL: [email or web address] Date: 12/05/2023 Prepared by: Chyrel Masson  Exercises - Seated Scapular Retraction  - 3-5 x daily - 7 x weekly - 1 sets - 10-15 reps - 3-5 hold - Seated Thoracic Lumbar Extension  - 3-5 x daily - 7 x weekly - 1 sets - 10-15 reps - 3 hold - Standing Bilateral Low Shoulder Row with Anchored Resistance  - 1-2 x daily - 7 x weekly - 2-3 sets - 10-15 reps  ASSESSMENT:  CLINICAL IMPRESSION: Patient is a 68 y.o. who comes to clinic with complaints of Lt mid thoracic/scapular pain with mobility, strength and movement coordination deficits that impair their ability to perform usual daily and recreational functional activities without increase difficulty/symptoms at this time.  Patient to benefit from skilled PT services to address impairments and limitations to improve to previous level of function without restriction secondary to condition.   OBJECTIVE IMPAIRMENTS: Abnormal gait, decreased activity tolerance, decreased balance, decreased coordination, decreased endurance, decreased mobility, difficulty  walking, decreased ROM, decreased strength, hypomobility, increased fascial restrictions, impaired perceived functional ability, increased muscle spasms, impaired flexibility, impaired UE functional use, improper body mechanics, postural dysfunction, and pain.   ACTIVITY LIMITATIONS: carrying, lifting, bending, sitting, standing, squatting, stairs, transfers, continence, bathing, toileting, dressing, reach over head, hygiene/grooming, locomotion level, and caring for others  PARTICIPATION LIMITATIONS: meal prep, cleaning, laundry, interpersonal relationship, and community activity  PERSONAL FACTORS: Anxiety, COPD, asthma, GERD, depression, length of time since onset are also affecting patient's functional outcome.   REHAB POTENTIAL: Good  CLINICAL DECISION MAKING: Stable/uncomplicated  EVALUATION COMPLEXITY: Low   GOALS: Goals reviewed with patient? Yes  SHORT TERM GOALS: (target date for Short term goals are 3 weeks 12/26/2023)  1.Patient will demonstrate independent use of home exercise program to maintain progress from in clinic treatments. Goal status: New  LONG TERM GOALS: (target  dates for all long term goals are 10 weeks  02/13/2024 )   1. Patient will demonstrate/report pain at worst less than or equal to 2/10 to facilitate minimal limitation in daily activity secondary to pain symptoms. Goal status: New   2. Patient will demonstrate independent use of home exercise program to facilitate ability to maintain/progress functional gains from skilled physical therapy services. Goal status: New   3. Patient will demonstrate patient specific functional scale reporting 7 out of 10 or better to indicate reduced disability due to condition. Goal status: New   4.  Patient will demonstrate thoracic ROM WFL s symptoms for normal mobility in daily life.   Goal status: New   5.  Patient will demonstrate /report ability to perform usual caregiver activity s restriction due to symptoms.      Goal status: New   PLAN:  PT FREQUENCY: 1-2x/week  PT DURATION: 12 weeks  PLANNED INTERVENTIONS: Can include 16109- PT Re-evaluation, 97110-Therapeutic exercises, 97530- Therapeutic activity, 97112- Neuromuscular re-education, 97535- Self Care, 97140- Manual therapy,  [phone]- Aquatic Therapy, 97014- Electrical stimulation (unattended), [id number] Physical performance testing,  [phone]- Traction (mechanical),    Patient/Family education, Balance training, Stair training, Taping, Dry Needling, Joint mobilization, Joint manipulation, Spinal manipulation, Spinal mobilization, Scar mobilization, Vestibular training, Visual/preceptual remediation/compensation, DME instructions, Cryotherapy, and Moist heat.  All performed as medically necessary.  All included unless contraindicated  PLAN FOR NEXT SESSION: Review HEP knowledge/results.   Chyrel Masson, PT, DPT, OCS, ATC 12/19/23  10:27 AM   Referring diagnosis? S29.012A (ICD-10-CM) - Strain of left latissimus dorsi muscle Treatment diagnosis? (if different than referring diagnosis) M54.6 What was this (referring dx) caused by? []  Surgery []  Fall [x]  Ongoing issue []  Arthritis []  Other: ____________  Laterality: []  Rt [x]  Lt []  Both  Check all possible CPT codes:  *CHOOSE 10 OR LESS*    See Planned Interventions listed in the Plan section of the Evaluation.

## 2023-12-21 ENCOUNTER — Encounter: Payer: Medicare HMO | Admitting: Student

## 2023-12-26 ENCOUNTER — Telehealth: Payer: Self-pay | Admitting: Rehabilitative and Restorative Service Providers"

## 2023-12-26 ENCOUNTER — Encounter: Payer: Medicare HMO | Admitting: Rehabilitative and Restorative Service Providers"

## 2023-12-26 NOTE — Therapy (Incomplete)
OUTPATIENT PHYSICAL THERAPY TREATMENT   Patient Name: [name] [name] MRN: [id number] DOB:31-Jan-1956, 68 y.o., female Today's Date: 12/26/2023  END OF SESSION:    Past Medical History:  Diagnosis Date   Anxiety    Asthma    Chronic, with restrictive component 2/2 obesity   COPD (chronic obstructive pulmonary disease) (HCC)    Depression    GERD (gastroesophageal reflux disease)    Headache    Insomnia    Obesity    On home oxygen therapy    "3L at night only when I get sick" (04/30/2014)   Shortness of breath dyspnea    Sleep apnea    "couldn't afford mask so I didn't get it" (04/30/2014)   Past Surgical History:  Procedure Laterality Date   APPENDECTOMY  ~ 2002   VAGINAL HYSTERECTOMY  ~ 1978   Patient Active Problem List   Diagnosis Date Noted   Unilateral primary osteoarthritis, left knee 12/12/2023   Strain of left latissimus dorsi muscle 11/23/2023   Sepsis due to gram-negative UTI (HCC) 07/17/2023   Polypharmacy 07/17/2023   Traumatic rhabdomyolysis (HCC) 07/17/2023   Elevated liver enzymes 07/17/2023   Anxiety state 07/17/2023   Urinary incontinence 04/14/2023   Falls 04/13/2023   Thrombocytopenia (HCC) 04/13/2023   Bacteremia due to Escherichia coli 02/07/2023   Osteopenia 09/16/2022   Pre-diabetes 08/01/2022   Posterior right knee pain 02/06/2019   OCD (obsessive compulsive disorder) 08/13/2018   Aortic atherosclerosis (HCC) 08/17/2016   Obesity hypoventilation syndrome (HCC) 01/21/2015   Healthcare maintenance 07/01/2014   OSA (obstructive sleep apnea) 04/04/2013   Anxiety disorder 07/02/2012   Depression 06/28/2012   Morbid obesity (HCC) 03/02/2012   GERD (gastroesophageal reflux disease) 02/05/2012   Smoking 02/05/2012    PCP: Rudene Christians DO  REFERRING PROVIDER: Dickie La, MD  REFERRING DIAG: S29.012A (ICD-10-CM) - Strain of left latissimus dorsi muscle  THERAPY DIAG:  No diagnosis found.  Rationale for Evaluation and  Treatment: Rehabilitation  ONSET DATE: 6-7 months (fall 2024)  SUBJECTIVE:                                                                                                                                                                                      SUBJECTIVE STATEMENT: Chronic complaints reported from Lt scapular/thoracic region.  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  Has seen chiro for condition "years ago".  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  Pt indicated last 6-7 months having to help caregive for husband.  Pt indicated when"tensed up", it starts hurting.  Pt indicated complaints with reaching up high, bending over, lifting.   Pt indicated  using patches that don't seem to help.  Pt indicated husband 435 lbs.  Pt indicated quad cane in Rt hand to help Lt knee (asked about MD for injection - referred to possible ortho MD visit for those questions).   Denied arm symptoms.   PERTINENT HISTORY: Anxiety, COPD, asthma, GERD, depression,Lt knee pain  PAIN:  NPRS scale: 7/10 current, 10/10.  0/10 at best.  Pain location: Lt scapular/thoracic pain Pain description: tightness/burning.   Aggravating factors: reaching up high, bending over, lifting. Relieving factors: nothing specific   PRECAUTIONS: None  WEIGHT BEARING RESTRICTIONS: No  FALLS:  Has patient fallen in last 6 months? Yes 1 in bathtub when passed out.  Pt indicated having fear of falling.   LIVING ENVIRONMENT: Lives in: House/apartment Stairs: has ramp out back Has following equipment at home: quad cane, has walker  OCCUPATION: No work indicated.   PLOF: Independent, indicated caregiver for husband that requires dressing, mobility help.   Reading/bird watching.     PATIENT GOALS: Reduce pain.  OBJECTIVE:   PATIENT SURVEYS:  12/05/2023 Patient specific functional scale (0 unable to 10 no difficulty) 1) Reaching down to floor to help change husband 2/10 (severe  trouble) 2) Lifting/carry wash pan with water  2/10 (severe trouble)  COGNITION: 12/05/2023 Overall cognitive status: WFL     SENSATION: 12/05/2023 Not tested  POSTURE: 12/05/2023 Rounded shoulders, protracted shoudlers, increased thoracic kyphosis with forward head in sitting/standing.   CERVICAL ROM No complaint  THORACIC ROM 12/05/2023: Extension: < 25 % WFL Flexion: 50% to lap  Lt rotation 52 deg c pian Rt rotation 65 deg    UPPER EXTREMITY ROM:   ROM Right 12/05/2023 Left 12/05/2023  Shoulder flexion General limit in upright reach General limit in upright reach  Shoulder extension    Shoulder abduction    Shoulder adduction    Shoulder internal rotation    Shoulder external rotation    Elbow flexion    Elbow extension    Wrist flexion    Wrist extension    Wrist ulnar deviation    Wrist radial deviation    Wrist pronation    Wrist supination    (Blank rows = not tested)  UPPER EXTREMITY MMT:  12/05/2023: Held testing today due to time.  General postural weakness noted  MMT Right 12/05/2023 Left 12/05/2023  Shoulder flexion    Shoulder extension    Shoulder abduction    Shoulder adduction    Shoulder internal rotation    Shoulder external rotation    Middle trapezius    Lower trapezius    Elbow flexion    Elbow extension    Wrist flexion    Wrist extension    Wrist ulnar deviation    Wrist radial deviation    Wrist pronation    Wrist supination    Grip strength (lbs)    (Blank rows = not tested)  SPECIAL TESTS: 12/05/2023 No specific special testing today  JOINT MOBILITY TESTING:  12/05/2023 General upper and mid thoracic passive accessory motion limitations, scapular retraction/upward rotation limits bilateral  PALPATION:  12/05/2023 Lt:  Tenderness inferior and medial aspect of scapular borders and in lower trap/middle trap/rhomboids)  TODAY'S TREATMENT:                                                                                                       DATE: 12/26/2023 Therex:      TODAY'S TREATMENT:                                                                                                       DATE: 12/05/2023 Therex:    HEP instruction/performance c cues for techniques, handout provided.  Trial set performed of each for comprehension and symptom assessment.  See below for exercise list   PATIENT EDUCATION: 12/05/2023 Education details: HEP, POC Person educated: Patient Education method: Explanation, Demonstration, Verbal cues, and Handouts Education comprehension: verbalized understanding, returned demonstration, and verbal cues required  HOME EXERCISE PROGRAM: Access Code: [email or web address] URL: [email or web address] Date: 12/05/2023 Prepared by: Chyrel Masson  Exercises - Seated Scapular Retraction  - 3-5 x daily - 7 x weekly - 1 sets - 10-15 reps - 3-5 hold - Seated Thoracic Lumbar Extension  - 3-5 x daily - 7 x weekly - 1 sets - 10-15 reps - 3 hold - Standing Bilateral Low Shoulder Row with Anchored Resistance  - 1-2 x daily - 7 x weekly - 2-3 sets - 10-15 reps  ASSESSMENT:  CLINICAL IMPRESSION: Patient is a 68 y.o. who comes to clinic with complaints of Lt mid thoracic/scapular pain with mobility, strength and movement coordination deficits that impair their ability to perform usual daily and recreational functional activities without increase difficulty/symptoms at this time.  Patient to benefit from skilled PT services to address impairments and limitations to improve to previous level of function without restriction secondary to condition.   OBJECTIVE IMPAIRMENTS: Abnormal gait, decreased activity tolerance, decreased balance, decreased coordination, decreased endurance, decreased mobility, difficulty  walking, decreased ROM, decreased strength, hypomobility, increased fascial restrictions, impaired perceived functional ability, increased muscle spasms, impaired flexibility, impaired UE functional use, improper body mechanics, postural dysfunction, and pain.   ACTIVITY LIMITATIONS: carrying, lifting, bending, sitting, standing, squatting, stairs, transfers, continence, bathing, toileting, dressing, reach over head, hygiene/grooming, locomotion level, and caring for others  PARTICIPATION LIMITATIONS: meal prep, cleaning, laundry, interpersonal relationship, and community activity  PERSONAL FACTORS: Anxiety, COPD, asthma, GERD, depression, length of time since onset are also affecting patient's functional outcome.   REHAB POTENTIAL: Good  CLINICAL DECISION MAKING: Stable/uncomplicated  EVALUATION COMPLEXITY: Low   GOALS: Goals reviewed with patient? Yes  SHORT TERM GOALS: (target date for Short term goals are 3 weeks 12/26/2023)  1.Patient will demonstrate independent use of home exercise program to maintain progress from in clinic treatments. Goal status: New  LONG TERM GOALS: (target  dates for all long term goals are 10 weeks  02/13/2024 )   1. Patient will demonstrate/report pain at worst less than or equal to 2/10 to facilitate minimal limitation in daily activity secondary to pain symptoms. Goal status: New   2. Patient will demonstrate independent use of home exercise program to facilitate ability to maintain/progress functional gains from skilled physical therapy services. Goal status: New   3. Patient will demonstrate patient specific functional scale reporting 7 out of 10 or better to indicate reduced disability due to condition. Goal status: New   4.  Patient will demonstrate thoracic ROM WFL s symptoms for normal mobility in daily life.   Goal status: New   5.  Patient will demonstrate /report ability to perform usual caregiver activity s restriction due to symptoms.      Goal status: New   PLAN:  PT FREQUENCY: 1-2x/week  PT DURATION: 12 weeks  PLANNED INTERVENTIONS: Can include 16109- PT Re-evaluation, 97110-Therapeutic exercises, 97530- Therapeutic activity, 97112- Neuromuscular re-education, 97535- Self Care, 97140- Manual therapy,  [phone]- Aquatic Therapy, 97014- Electrical stimulation (unattended), [id number] Physical performance testing,  [phone]- Traction (mechanical),    Patient/Family education, Balance training, Stair training, Taping, Dry Needling, Joint mobilization, Joint manipulation, Spinal manipulation, Spinal mobilization, Scar mobilization, Vestibular training, Visual/preceptual remediation/compensation, DME instructions, Cryotherapy, and Moist heat.  All performed as medically necessary.  All included unless contraindicated  PLAN FOR NEXT SESSION: Review HEP knowledge/results.   Chyrel Masson, PT, DPT, OCS, ATC 12/26/23  12:58 PM   Referring diagnosis? S29.012A (ICD-10-CM) - Strain of left latissimus dorsi muscle Treatment diagnosis? (if different than referring diagnosis) M54.6 What was this (referring dx) caused by? []  Surgery []  Fall [x]  Ongoing issue []  Arthritis []  Other: ____________  Laterality: []  Rt [x]  Lt []  Both  Check all possible CPT codes:  *CHOOSE 10 OR LESS*    See Planned Interventions listed in the Plan section of the Evaluation.

## 2023-12-26 NOTE — Telephone Encounter (Signed)
 Called patient after 15 mins no show for appointment today.  Pt indicated husband went into nursing home and she needed to cancel appointments.   Chyrel Masson, PT, DPT, OCS, ATC 12/26/23  1:17 PM

## 2024-01-02 ENCOUNTER — Encounter: Payer: Medicare HMO | Admitting: Rehabilitative and Restorative Service Providers"

## 2024-01-06 ENCOUNTER — Other Ambulatory Visit: Payer: Self-pay | Admitting: Internal Medicine

## 2024-01-06 ENCOUNTER — Other Ambulatory Visit: Payer: Self-pay | Admitting: Physician Assistant

## 2024-01-08 ENCOUNTER — Other Ambulatory Visit: Payer: Self-pay

## 2024-01-08 ENCOUNTER — Encounter (HOSPITAL_COMMUNITY): Payer: Self-pay

## 2024-01-08 ENCOUNTER — Emergency Department (HOSPITAL_COMMUNITY)

## 2024-01-08 ENCOUNTER — Inpatient Hospital Stay (HOSPITAL_COMMUNITY)
Admission: EM | Admit: 2024-01-08 | Discharge: 2024-01-11 | DRG: 193 | Disposition: A | Discharge status: Home Health | Attending: Internal Medicine | Admitting: Internal Medicine

## 2024-01-08 ENCOUNTER — Encounter (HOSPITAL_COMMUNITY): Payer: Self-pay | Admitting: *Deleted

## 2024-01-08 ENCOUNTER — Ambulatory Visit (HOSPITAL_COMMUNITY): Admission: EM | Admit: 2024-01-08 | Discharge: 2024-01-08 | Disposition: A | Discharge status: Home / Self Care

## 2024-01-08 DIAGNOSIS — Z79899 Other long term (current) drug therapy: Secondary | ICD-10-CM

## 2024-01-08 DIAGNOSIS — R0902 Hypoxemia: Secondary | ICD-10-CM | POA: Diagnosis not present

## 2024-01-08 DIAGNOSIS — Z6830 Body mass index (BMI) 30.0-30.9, adult: Secondary | ICD-10-CM

## 2024-01-08 DIAGNOSIS — J101 Influenza due to other identified influenza virus with other respiratory manifestations: Principal | ICD-10-CM | POA: Diagnosis present

## 2024-01-08 DIAGNOSIS — M279 Disease of jaws, unspecified: Secondary | ICD-10-CM | POA: Diagnosis not present

## 2024-01-08 DIAGNOSIS — Z825 Family history of asthma and other chronic lower respiratory diseases: Secondary | ICD-10-CM

## 2024-01-08 DIAGNOSIS — J111 Influenza due to unidentified influenza virus with other respiratory manifestations: Secondary | ICD-10-CM | POA: Diagnosis not present

## 2024-01-08 DIAGNOSIS — Z888 Allergy status to other drugs, medicaments and biological substances status: Secondary | ICD-10-CM

## 2024-01-08 DIAGNOSIS — G4700 Insomnia, unspecified: Secondary | ICD-10-CM | POA: Diagnosis present

## 2024-01-08 DIAGNOSIS — J09X2 Influenza due to identified novel influenza A virus with other respiratory manifestations: Secondary | ICD-10-CM | POA: Diagnosis present

## 2024-01-08 DIAGNOSIS — R0689 Other abnormalities of breathing: Secondary | ICD-10-CM

## 2024-01-08 DIAGNOSIS — K219 Gastro-esophageal reflux disease without esophagitis: Secondary | ICD-10-CM | POA: Diagnosis present

## 2024-01-08 DIAGNOSIS — Z886 Allergy status to analgesic agent status: Secondary | ICD-10-CM

## 2024-01-08 DIAGNOSIS — D696 Thrombocytopenia, unspecified: Secondary | ICD-10-CM | POA: Diagnosis present

## 2024-01-08 DIAGNOSIS — J9601 Acute respiratory failure with hypoxia: Principal | ICD-10-CM | POA: Diagnosis present

## 2024-01-08 DIAGNOSIS — E669 Obesity, unspecified: Secondary | ICD-10-CM | POA: Diagnosis present

## 2024-01-08 DIAGNOSIS — Z811 Family history of alcohol abuse and dependence: Secondary | ICD-10-CM

## 2024-01-08 DIAGNOSIS — G4730 Sleep apnea, unspecified: Secondary | ICD-10-CM | POA: Diagnosis present

## 2024-01-08 DIAGNOSIS — E873 Alkalosis: Secondary | ICD-10-CM | POA: Diagnosis present

## 2024-01-08 DIAGNOSIS — Z7985 Long-term (current) use of injectable non-insulin antidiabetic drugs: Secondary | ICD-10-CM

## 2024-01-08 DIAGNOSIS — J45909 Unspecified asthma, uncomplicated: Secondary | ICD-10-CM

## 2024-01-08 DIAGNOSIS — Z87891 Personal history of nicotine dependence: Secondary | ICD-10-CM

## 2024-01-08 DIAGNOSIS — J4489 Other specified chronic obstructive pulmonary disease: Secondary | ICD-10-CM | POA: Diagnosis present

## 2024-01-08 DIAGNOSIS — F32A Depression, unspecified: Secondary | ICD-10-CM | POA: Diagnosis present

## 2024-01-08 DIAGNOSIS — F419 Anxiety disorder, unspecified: Secondary | ICD-10-CM | POA: Diagnosis present

## 2024-01-08 DIAGNOSIS — Z803 Family history of malignant neoplasm of breast: Secondary | ICD-10-CM

## 2024-01-08 HISTORY — DX: Acute respiratory failure with hypoxia: J96.01

## 2024-01-08 LAB — BASIC METABOLIC PANEL
Anion gap: 11 (ref 5–15)
BUN: 6 mg/dL — ABNORMAL LOW (ref 8–23)
CO2: 27 mmol/L (ref 22–32)
Calcium: 8.9 mg/dL (ref 8.9–10.3)
Chloride: 100 mmol/L (ref 98–111)
Creatinine, Ser: 0.68 mg/dL (ref 0.44–1.00)
GFR, Estimated: >60 mL/min (ref >60)
Glucose, Bld: 144 mg/dL — ABNORMAL HIGH (ref 70–99)
Potassium: 3.3 mmol/L — ABNORMAL LOW (ref 3.5–5.1)
Sodium: 138 mmol/L (ref 135–145)

## 2024-01-08 LAB — TROPONIN I (HIGH SENSITIVITY): Troponin I (High Sensitivity): 8 ng/L (ref <18)

## 2024-01-08 LAB — CBC WITH DIFFERENTIAL/PLATELET
Abs Immature Granulocytes: 0.02 10*3/uL (ref 0.00–0.07)
Basophils Absolute: 0 10*3/uL (ref 0.0–0.1)
Basophils Relative: 0 %
Eosinophils Absolute: 0 10*3/uL (ref 0.0–0.5)
Eosinophils Relative: 0 %
HCT: 48.9 % — ABNORMAL HIGH (ref 36.0–46.0)
Hemoglobin: 17.2 g/dL — ABNORMAL HIGH (ref 12.0–15.0)
Immature Granulocytes: 0 %
Lymphocytes Relative: 17 %
Lymphs Abs: 0.8 10*3/uL (ref 0.7–4.0)
MCH: 31.1 pg (ref 26.0–34.0)
MCHC: 35.2 g/dL (ref 30.0–36.0)
MCV: 88.4 fL (ref 80.0–100.0)
Monocytes Absolute: 0.5 10*3/uL (ref 0.1–1.0)
Monocytes Relative: 11 %
Neutro Abs: 3.4 10*3/uL (ref 1.7–7.7)
Neutrophils Relative %: 72 %
Platelets: 97 10*3/uL — ABNORMAL LOW (ref 150–400)
RBC: 5.53 MIL/uL — ABNORMAL HIGH (ref 3.87–5.11)
RDW: 13.1 % (ref 11.5–15.5)
WBC: 4.7 10*3/uL (ref 4.0–10.5)
nRBC: 0 % (ref 0.0–0.2)

## 2024-01-08 LAB — I-STAT VENOUS BLOOD GAS, ED
Acid-Base Excess: 8 mmol/L — ABNORMAL HIGH (ref 0.0–2.0)
Bicarbonate: 32.5 mmol/L — ABNORMAL HIGH (ref 20.0–28.0)
Calcium, Ion: 1 mmol/L — ABNORMAL LOW (ref 1.15–1.40)
HCT: 49 % — ABNORMAL HIGH (ref 36.0–46.0)
Hemoglobin: 16.7 g/dL — ABNORMAL HIGH (ref 12.0–15.0)
O2 Saturation: 90 %
Potassium: 4.1 mmol/L (ref 3.5–5.1)
Sodium: 135 mmol/L (ref 135–145)
TCO2: 34 mmol/L — ABNORMAL HIGH (ref 22–32)
pCO2, Ven: 42.7 mmHg — ABNORMAL LOW (ref 44–60)
pH, Ven: 7.489 — ABNORMAL HIGH (ref 7.25–7.43)
pO2, Ven: 55 mmHg — ABNORMAL HIGH (ref 32–45)

## 2024-01-08 LAB — RESP PANEL BY RT-PCR (RSV, FLU A&B, COVID)  RVPGX2
Influenza A by PCR: POSITIVE — AB
Influenza B by PCR: NEGATIVE
Resp Syncytial Virus by PCR: NEGATIVE
SARS Coronavirus 2 by RT PCR: NEGATIVE

## 2024-01-08 LAB — BRAIN NATRIURETIC PEPTIDE: B Natriuretic Peptide: 23.4 pg/mL (ref 0.0–100.0)

## 2024-01-08 MED ORDER — POTASSIUM CHLORIDE 20 MEQ PO PACK
20.0000 meq | PACK | Freq: Once | ORAL | Status: AC
  Administered 2024-01-08: 20 meq via ORAL
  Filled 2024-01-08: qty 1

## 2024-01-08 MED ORDER — RIVAROXABAN 10 MG PO TABS
10.0000 mg | ORAL_TABLET | Freq: Every day | ORAL | Status: DC
Start: 2024-01-09 — End: 2024-01-11
  Administered 2024-01-09 – 2024-01-11 (×3): 10 mg via ORAL
  Filled 2024-01-08 (×3): qty 1

## 2024-01-08 MED ORDER — OSELTAMIVIR PHOSPHATE 75 MG PO CAPS
75.0000 mg | ORAL_CAPSULE | Freq: Two times a day (BID) | ORAL | Status: DC
  Administered 2024-01-08 – 2024-01-11 (×6): 75 mg via ORAL
  Filled 2024-01-08 (×7): qty 1

## 2024-01-08 MED ORDER — IPRATROPIUM-ALBUTEROL 0.5-2.5 (3) MG/3ML IN SOLN
3.0000 mL | Freq: Once | RESPIRATORY_TRACT | Status: AC
  Administered 2024-01-08: 3 mL via RESPIRATORY_TRACT
  Filled 2024-01-08: qty 3

## 2024-01-08 MED ORDER — GABAPENTIN 400 MG PO CAPS
400.0000 mg | ORAL_CAPSULE | Freq: Two times a day (BID) | ORAL | Status: DC
  Administered 2024-01-08 – 2024-01-11 (×6): 400 mg via ORAL
  Filled 2024-01-08 (×6): qty 1

## 2024-01-08 MED ORDER — ALPRAZOLAM 0.5 MG PO TABS
1.0000 mg | ORAL_TABLET | Freq: Two times a day (BID) | ORAL | Status: DC | PRN
  Administered 2024-01-08 – 2024-01-10 (×4): 1 mg via ORAL
  Filled 2024-01-08 (×4): qty 2

## 2024-01-08 MED ORDER — TRAZODONE HCL 50 MG PO TABS
150.0000 mg | ORAL_TABLET | Freq: Every day | ORAL | Status: DC
  Administered 2024-01-08 – 2024-01-10 (×3): 150 mg via ORAL
  Filled 2024-01-08 (×3): qty 3

## 2024-01-08 MED ORDER — ACETAMINOPHEN 500 MG PO TABS
1000.0000 mg | ORAL_TABLET | Freq: Four times a day (QID) | ORAL | Status: DC | PRN
  Administered 2024-01-08 – 2024-01-09 (×2): 1000 mg via ORAL
  Filled 2024-01-08 (×2): qty 2

## 2024-01-08 MED ORDER — SERTRALINE HCL 100 MG PO TABS
100.0000 mg | ORAL_TABLET | Freq: Every day | ORAL | Status: DC
  Administered 2024-01-08 – 2024-01-11 (×4): 100 mg via ORAL
  Filled 2024-01-08 (×4): qty 1

## 2024-01-08 MED ORDER — SODIUM CHLORIDE 0.9 % IV BOLUS
1000.0000 mL | Freq: Once | INTRAVENOUS | Status: AC
  Administered 2024-01-08: 1000 mL via INTRAVENOUS

## 2024-01-08 MED ORDER — ACETAMINOPHEN 500 MG PO TABS: 1000.0000 mg | ORAL_TABLET | Freq: Four times a day (QID) | ORAL | Status: DC | PRN

## 2024-01-08 MED ORDER — OSELTAMIVIR PHOSPHATE 75 MG PO CAPS
75.0000 mg | ORAL_CAPSULE | Freq: Once | ORAL | Status: DC
Start: 2024-01-08 — End: 2024-01-08

## 2024-01-08 MED ORDER — IPRATROPIUM-ALBUTEROL 0.5-2.5 (3) MG/3ML IN SOLN: 3.0000 mL | RESPIRATORY_TRACT | Status: DC | PRN

## 2024-01-08 NOTE — Hospital Course (Addendum)
 RR 20-30s Flu a  3L [place]    Respiratory alkalosis compensated   Diarrhea, shortness of breath, malaise 4 days; diarrhea resolved; coughing up green sputum x 1 week; pulse ox at home in 80s; used to have O2 in home but not anymore; subjective fevers and diaphoresis 2 days ago; last time she ate was last Tuesday; hungry now but didn't have an appetite before. 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

## 2024-01-08 NOTE — Progress Notes (Signed)
 PT Cancellation Note  Patient Details Name: [name] [name] MRN: [id number] DOB: May 31, 1956   Cancelled Treatment:    Reason Eval/Treat Not Completed: Medical issues which prohibited therapy  Met with pt. Nursing at bedside, apparently just had a hypoxic episode resting in bed. Requests hold comprehensive evaluation while pt recovers. Will check back later as schedule permits, but may be tomorrow morning before we can follow up.  Kathlyn Sacramento, PT, DPT [facility]  Rehabilitation Services Physical Therapist Office: [phone] Website: [email or web address]   Berton Mount 01/08/2024, 4:18 PM

## 2024-01-08 NOTE — ED Triage Notes (Addendum)
 Pt states she has cough, congestion, SOB, headache, diarrhea X 4 days. She has been taking tylenol as needed. She states she used her albuterol MDI yesterday.   She doesn't have headache currently.    Pt states her o2 was 83 last night and she was very sleepy. She was on O2 in the past but states her provider took her off.

## 2024-01-08 NOTE — ED Provider Notes (Signed)
 [place] [facility] DEPARTMENT AT [facility] Provider Note  CSN: [id number] Arrival date & time: 01/08/24 1204  Chief Complaint(s) Shortness of Breath  HPI [name] [name] is a 68 y.o. female history of COPD, GERD presenting to the emergency department with shortness of breath.  Patient reports shortness of breath for the past 4 days, associated with diarrhea, cough, nausea, vomiting, sore throat, runny nose, fatigue.  No known sick contacts.  Reports she was previously on oxygen but they took it away as she did not need it any longer.  She tried her home treatments without any improvement.  She measured her oxygen at home and it was 83% so she went to urgent care, where was measured as 89%, and she was referred to the emergency department.  She reports minimal chest pain with cough but no pleuritic pain.   Past Medical History Past Medical History:  Diagnosis Date   Anxiety    Asthma    Chronic, with restrictive component 2/2 obesity   COPD (chronic obstructive pulmonary disease) (HCC)    Depression    GERD (gastroesophageal reflux disease)    Headache    Insomnia    Obesity    On home oxygen therapy    "3L at night only when I get sick" (04/30/2014)   Shortness of breath dyspnea    Sleep apnea    "couldn't afford mask so I didn't get it" (04/30/2014)   Patient Active Problem List   Diagnosis Date Noted   Unilateral primary osteoarthritis, left knee 12/12/2023   Strain of left latissimus dorsi muscle 11/23/2023   Sepsis due to gram-negative UTI (HCC) 07/17/2023   Polypharmacy 07/17/2023   Traumatic rhabdomyolysis (HCC) 07/17/2023   Elevated liver enzymes 07/17/2023   Anxiety state 07/17/2023   Urinary incontinence 04/14/2023   Falls 04/13/2023   Thrombocytopenia (HCC) 04/13/2023   Bacteremia due to Escherichia coli 02/07/2023   Osteopenia 09/16/2022   Pre-diabetes 08/01/2022   Posterior right knee pain 02/06/2019   OCD (obsessive compulsive disorder)  08/13/2018   Aortic atherosclerosis (HCC) 08/17/2016   Obesity hypoventilation syndrome (HCC) 01/21/2015   Healthcare maintenance 07/01/2014   OSA (obstructive sleep apnea) 04/04/2013   Anxiety disorder 07/02/2012   Depression 06/28/2012   Morbid obesity (HCC) 03/02/2012   GERD (gastroesophageal reflux disease) 02/05/2012   Smoking 02/05/2012   Home Medication(s) Prior to Admission medications   Medication Sig Start Date End Date Taking? Authorizing Provider  busPIRone (BUSPAR) 5 MG tablet Take 5 mg by mouth 3 (three) times daily. 12/14/23  Yes [provider]  acetaminophen (TYLENOL) 500 MG tablet Take 1,000 mg by mouth every 6 (six) hours as needed (for pain OR back pain).     [provider]  albuterol (VENTOLIN HFA) 108 (90 Base) MCG/ACT inhaler Inhale 2 puffs into the lungs every 6 (six) hours as needed for wheezing or shortness of breath. 06/16/20   Remo Lipps, MD  ALPRAZolam Prudy Feeler) 1 MG tablet Take 1 tablet (1 mg total) by mouth 2 (two) times daily as needed for anxiety. 11/06/23   [name], PA-C  atorvastatin (LIPITOR) 10 MG tablet TAKE 1 TABLET(10 MG) BY MOUTH DAILY Patient not taking: Reported on 08/11/2023 04/12/23   [name], [name], DO  diclofenac Sodium (VOLTAREN) 1 % GEL Apply 2 g topically 4 (four) times daily. 11/23/23   [name], [name], DO  gabapentin (NEURONTIN) 400 MG capsule TAKE 1 CAPSULE(400 MG) BY MOUTH TWICE DAILY 01/07/24   Cherie Ouch, PA-C  lidocaine (LIDODERM) 5 % Place 1 patch onto the skin every 12 (twelve) hours. Remove & Discard patch within 12 hours or as directed by MD 11/23/23 11/22/24  [name], [name], DO  mirabegron ER (MYRBETRIQ) 25 MG TB24 tablet Take 1 tablet (25 mg total) by mouth daily. 08/07/23   Rocky Morel, DO  OZEMPIC, 1 MG/DOSE, 4 MG/3ML SOPN INJECT 1 MG UNDER THE SKIN ONE DAY A WEEK 01/08/24   [name], [name], DO  pantoprazole (PROTONIX) 40 MG tablet TAKE 1 TABLET(40 MG) BY MOUTH DAILY 04/12/23   [name], [name], DO   sertraline (ZOLOFT) 100 MG tablet TAKE 1 TABLET BY MOUTH EVERY DAY 12/04/23   [name], PA-C  traZODone (DESYREL) 150 MG tablet Take 1 tablet (150 mg total) by mouth at bedtime. 08/11/23   Gwynneth Macleod                                                                                                                                    Past Surgical History Past Surgical History:  Procedure Laterality Date   APPENDECTOMY  ~ 2002   VAGINAL HYSTERECTOMY  ~ 1978   Family History Family History  Problem Relation Age of Onset   Breast cancer Mother    Alcohol abuse Mother    Emphysema Mother        smoked   Asthma Mother    Breast cancer Maternal Aunt    Breast cancer Maternal Grandmother     Social History Social History   Tobacco Use   Smoking status: Former    Current packs/day: 0.00    Types: Cigarettes    Quit date: 11/07/1968    Years since quitting: 55.2   Smokeless tobacco: Never  Vaping Use   Vaping status: Never Used  Substance Use Topics   Alcohol use: No    Alcohol/week: 0.0 standard drinks of alcohol   Drug use: No   Allergies Ibuprofen and Clonazepam  Review of Systems Review of Systems  All other systems reviewed and are negative.   Physical Exam Vital Signs  I have reviewed the triage vital signs BP 104/61   Pulse 72   Temp 98.3 F (36.8 C) (Oral)   Resp (!) 26   Ht 5\' 8"  (1.727 m)   Wt 92.1 kg   SpO2 90%   BMI 30.87 kg/m  Physical Exam Vitals and nursing note reviewed.  Constitutional:      General: She is not in acute distress.    Appearance: She is well-developed.  HENT:     Head: Normocephalic and atraumatic.     Mouth/Throat:     Mouth: Mucous membranes are moist.  Eyes:     Pupils: Pupils are equal, round, and reactive to light.  Cardiovascular:     Rate and Rhythm: Normal rate and regular rhythm.     Heart sounds: No murmur heard. Pulmonary:     Effort: Pulmonary  effort is normal. Tachypnea present. No respiratory  distress.     Breath sounds: Examination of the right-middle field reveals wheezing. Examination of the left-middle field reveals wheezing. Examination of the right-lower field reveals wheezing. Examination of the left-lower field reveals wheezing. Wheezing present.  Abdominal:     General: Abdomen is flat.     Palpations: Abdomen is soft.     Tenderness: There is no abdominal tenderness.  Musculoskeletal:        General: No tenderness.     Right lower leg: No edema.     Left lower leg: No edema.  Skin:    General: Skin is warm and dry.  Neurological:     General: No focal deficit present.     Mental Status: She is alert. Mental status is at baseline.  Psychiatric:        Mood and Affect: Mood normal.        Behavior: Behavior normal.     ED Results and Treatments Labs (all labs ordered are listed, but only abnormal results are displayed) Labs Reviewed  RESP PANEL BY RT-PCR (RSV, FLU A&B, COVID)  RVPGX2 - Abnormal; Notable for the following components:      Result Value   Influenza A by PCR POSITIVE (*)    All other components within normal limits  CBC WITH DIFFERENTIAL/PLATELET - Abnormal; Notable for the following components:   RBC 5.53 (*)    Hemoglobin 17.2 (*)    HCT 48.9 (*)    Platelets 97 (*)    All other components within normal limits  I-STAT VENOUS BLOOD GAS, ED - Abnormal; Notable for the following components:   pH, Ven 7.489 (*)    pCO2, Ven 42.7 (*)    pO2, Ven 55 (*)    Bicarbonate 32.5 (*)    TCO2 34 (*)    Acid-Base Excess 8.0 (*)    Calcium, Ion 1.00 (*)    HCT 49.0 (*)    Hemoglobin 16.7 (*)    All other components within normal limits  BRAIN NATRIURETIC PEPTIDE  TROPONIN I (HIGH SENSITIVITY)  TROPONIN I (HIGH SENSITIVITY)                                                                                                                          Radiology No results found.  Pertinent labs & imaging results that were available during my care of the  patient were reviewed by me and considered in my medical decision making (see MDM for details).  Medications Ordered in ED Medications  oseltamivir (TAMIFLU) capsule 75 mg (has no administration in time range)  ipratropium-albuterol (DUONEB) 0.5-2.5 (3) MG/3ML nebulizer solution 3 mL (3 mLs Nebulization Given 01/08/24 1411)  sodium chloride 0.9 % bolus 1,000 mL (1,000 mLs Intravenous New Bag/Given 01/08/24 1408)  Procedures .Critical Care  Performed by: Lonell Grandchild, MD Authorized by: Lonell Grandchild, MD   Critical care provider statement:    Critical care time (minutes):  30   Critical care was necessary to treat or prevent imminent or life-threatening deterioration of the following conditions:  Respiratory failure   Critical care was time spent personally by me on the following activities:  Development of treatment plan with patient or surrogate, discussions with consultants, evaluation of patient's response to treatment, examination of patient, ordering and review of laboratory studies, ordering and review of radiographic studies, ordering and performing treatments and interventions, pulse oximetry, re-evaluation of patient's condition and review of old charts   Care discussed with: admitting provider     (including critical care time)  Medical Decision Making / ED Course   MDM:  68 year old presenting to the emergency department shortness of breath.  Patient hypoxic, placed on 3 L of oxygen.  When oxygen removed, patient desaturates to 87%.  Flu test is positive, which is likely the source of her hypoxia.  She also has some wheezing so there is probably a component of COPD.  Chest x-ray is pending, by my interpretation no obvious pneumonia, infiltrate, pneumothorax.  Will give Tamiflu given the flu.  Considered other causes of hypoxia such as  pulmonary embolism but without pleuritic chest pain, tachycardic, lower concern for this and there is no alternative cause of her hypoxia present.  Patient without hypercapnia.  Discussed with internal medicine service who will admit the patient.      Additional history obtained: -Additional history obtained from ems -External records from outside source obtained and reviewed including: Chart review including previous notes, labs, imaging, consultation notes including prior UC note    Lab Tests: -I ordered, reviewed, and interpreted labs.   The pertinent results include:   Labs Reviewed  RESP PANEL BY RT-PCR (RSV, FLU A&B, COVID)  RVPGX2 - Abnormal; Notable for the following components:      Result Value   Influenza A by PCR POSITIVE (*)    All other components within normal limits  CBC WITH DIFFERENTIAL/PLATELET - Abnormal; Notable for the following components:   RBC 5.53 (*)    Hemoglobin 17.2 (*)    HCT 48.9 (*)    Platelets 97 (*)    All other components within normal limits  I-STAT VENOUS BLOOD GAS, ED - Abnormal; Notable for the following components:   pH, Ven 7.489 (*)    pCO2, Ven 42.7 (*)    pO2, Ven 55 (*)    Bicarbonate 32.5 (*)    TCO2 34 (*)    Acid-Base Excess 8.0 (*)    Calcium, Ion 1.00 (*)    HCT 49.0 (*)    Hemoglobin 16.7 (*)    All other components within normal limits  BRAIN NATRIURETIC PEPTIDE  TROPONIN I (HIGH SENSITIVITY)  TROPONIN I (HIGH SENSITIVITY)    Notable for +flu      Imaging Studies ordered: I ordered imaging studies including CXR On my interpretation imaging demonstrates no acute process I independently visualized and interpreted imaging. I agree with the radiologist interpretation   Medicines ordered and prescription drug management: Meds ordered this encounter  Medications   ipratropium-albuterol (DUONEB) 0.5-2.5 (3) MG/3ML nebulizer solution 3 mL   sodium chloride 0.9 % bolus 1,000 mL   oseltamivir (TAMIFLU) capsule 75 mg     -I have reviewed the patients home medicines and have made adjustments as needed   Consultations Obtained: I requested  consultation with the internal medicine service,  and discussed lab and imaging findings as well as pertinent plan - they recommend: admission   Cardiac Monitoring: The patient was maintained on a cardiac monitor.  I personally viewed and interpreted the cardiac monitored which showed an underlying rhythm of: NSR  Social Determinants of Health:  Diagnosis or treatment significantly limited by social determinants of health: obesity   Reevaluation: After the interventions noted above, I reevaluated the patient and found that their symptoms have improved  Co morbidities that complicate the patient evaluation  Past Medical History:  Diagnosis Date   Anxiety    Asthma    Chronic, with restrictive component 2/2 obesity   COPD (chronic obstructive pulmonary disease) (HCC)    Depression    GERD (gastroesophageal reflux disease)    Headache    Insomnia    Obesity    On home oxygen therapy    "3L at night only when I get sick" (04/30/2014)   Shortness of breath dyspnea    Sleep apnea    "couldn't afford mask so I didn't get it" (04/30/2014)      Dispostion: Disposition decision including need for hospitalization was considered, and patient admitted to the hospital.    Final Clinical Impression(s) / ED Diagnoses Final diagnoses:  Acute hypoxic respiratory failure (HCC)  Influenza     This chart was dictated using voice recognition software.  Despite best efforts to proofread,  errors can occur which can change the documentation meaning.    Lonell Grandchild, MD 01/08/24 [phone]

## 2024-01-08 NOTE — Discharge Instructions (Signed)
 At this time you are having significant trouble breathing and reduced oxygen. You have requested transport to the ED for further evaluation and we have called [facility] to take you there per your wishes.

## 2024-01-08 NOTE — ED Notes (Signed)
Walked patient to the bathroom patient did well patient is now back in bed on the monitor with call bell in reach

## 2024-01-08 NOTE — ED Triage Notes (Signed)
 Patient bib [facility] from urgent care. Complaints of shortness of breath, nausea, vomiting and diarrhea for 4 days. Patient satting 95% on 3L on arrival. Alert and oriented x4.

## 2024-01-08 NOTE — Telephone Encounter (Signed)
 Medication sent to pharmacy

## 2024-01-08 NOTE — H&P (Signed)
 Date: 01/08/2024               Patient Name:  [name] [name] MRN: [id number]  DOB: Oct 22, 1956 Age / Sex: 68 y.o., female   PCP: Rudene Christians, DO         Medical Service: Internal Medicine Teaching Service         Attending Physician: Dr. Mayford Knife, Dorene Ar, MD      First Contact: Dr. Morrie Sheldon, MD  Pager: [phone]    Second Contact: Dr. Rudene Christians, DO  Pager: [phone]         After Hours (After 5p/  First Contact Pager: [phone]  weekends / holidays): Second Contact Pager: [phone]   SUBJECTIVE   Chief Complaint: Shortness of breath  History of Present Illness: [name] [name] is a 68 YO F with a history of COPD and anxiety presenting with a 4-day history of nausea, SOB, vomiting, and diarrhea who was admitted for acute hypoxic respiratory failure.  She was seen in urgent care earlier today and noted to have had an O2 saturation of 89%, so she was referred to the emergency department. About 1 week ago, she endorsed a cough productive of green sputum.  Roughly 4 days ago, she developed diarrhea, shortness of breath, and malaise.  She has a pulse ox at home and noted that it has been in the 80s.  She used to have supplemental oxygen but does not use this anymore.  She endorsed a subjective fever and diaphoresis roughly 2 days ago.   ED Course:  Positive for influenza A No leukocytosis Compensated respiratory alkalosis on VBG Required 3 L of nasal cannula supplemental O2  Meds:  Zoloft 100 mg daily Xanax 1 mg twice daily as needed Trazodone 150 mg daily at nighttime Ozempic 1 milligram per dose weekly  Past Medical History:  Diagnosis Date   Anxiety    Asthma    Chronic, with restrictive component 2/2 obesity   COPD (chronic obstructive pulmonary disease) (HCC)    Depression    GERD (gastroesophageal reflux disease)    Headache    Insomnia    Obesity    On home oxygen therapy    "3L at night only when I get sick" (04/30/2014)   Shortness of breath  dyspnea    Sleep apnea    "couldn't afford mask so I didn't get it" (04/30/2014)    Past Surgical History:  Procedure Laterality Date   APPENDECTOMY  ~ 2002   VAGINAL HYSTERECTOMY  ~ 1978    Social:  Lives With: Husband Occupation: Retired, used to work at [employer] Function: Independent of ADLs and IADLs PCP: Dr. [name] [name]  Substances: Used to smoke 2 packs/day since she was 14 but stopped 1 year ago  Family History  Problem Relation Age of Onset   Breast cancer Mother    Alcohol abuse Mother    Emphysema Mother        smoked   Asthma Mother    Breast cancer Maternal Aunt    Breast cancer Maternal Grandmother     Allergies: Allergies as of 01/08/2024 - Review Complete 01/08/2024  Allergen Reaction Noted   Ibuprofen Anaphylaxis, Hives, and Swelling 02/01/2012   Clonazepam Other (See Comments) 08/30/2019    Review of Systems: A complete ROS was negative except as per HPI.   OBJECTIVE:   Physical Exam: Blood pressure 104/61, pulse 72, temperature 98.3 F (36.8 C), temperature source Oral, resp. rate (!) 26, height 5\' 8"  (1.727  m), weight 92.1 kg, SpO2 90%.  Constitutional: well-appearing sitting in no acute distress HENT: normocephalic atraumatic, mucous membranes moist Eyes: conjunctiva non-erythematous Neck: supple Cardiovascular: regular rate and rhythm, no m/r/g Pulmonary/Chest: tachypnea, lungs clear to auscultation bilaterally Abdominal: soft, non-tender, non-distended MSK: normal bulk and tone Neurological: alert & oriented x 3, 5/5 strength in bilateral upper and lower extremities, normal gait Skin: warm and dry  Labs: CBC    Component Value Date/Time   WBC 4.7 01/08/2024 1348   RBC 5.53 (H) 01/08/2024 1348   HGB 16.7 (H) 01/08/2024 1353   HGB 16.2 (H) 04/13/2023 1534   HCT 49.0 (H) 01/08/2024 1353   HCT 46.7 (H) 04/13/2023 1534   PLT 97 (L) 01/08/2024 1348   PLT 139 (L) 04/13/2023 1534   MCV 88.4 01/08/2024 1348   MCV 91  04/13/2023 1534   MCH 31.1 01/08/2024 1348   MCHC 35.2 01/08/2024 1348   RDW 13.1 01/08/2024 1348   RDW 13.4 04/13/2023 1534   LYMPHSABS 0.8 01/08/2024 1348   LYMPHSABS 1.8 04/13/2023 1534   MONOABS 0.5 01/08/2024 1348   EOSABS 0.0 01/08/2024 1348   EOSABS 0.1 04/13/2023 1534   BASOSABS 0.0 01/08/2024 1348   BASOSABS 0.0 04/13/2023 1534     CMP     Component Value Date/Time   NA 135 01/08/2024 1353   NA 139 08/07/2023 1456   K 4.1 01/08/2024 1353   CL 101 08/07/2023 1456   CO2 25 08/07/2023 1456   GLUCOSE 91 08/07/2023 1456   GLUCOSE 102 (H) 07/17/2023 0405   BUN 6 (L) 08/07/2023 1456   CREATININE 0.49 (L) 08/07/2023 1456   CREATININE 0.55 05/23/2014 1050   CALCIUM 9.0 08/07/2023 1456   PROT 6.0 08/07/2023 1456   ALBUMIN 3.9 08/07/2023 1456   AST 10 08/07/2023 1456   ALT 6 08/07/2023 1456   ALKPHOS 126 (H) 08/07/2023 1456   BILITOT 0.3 08/07/2023 1456   GFRNONAA >60 07/17/2023 0405   GFRNONAA >89 05/23/2014 1050   GFRAA >60 06/03/2020 1149   GFRAA >89 05/23/2014 1050    Imaging: Portable Chest 1 View IMPRESSION: No acute disease.  EKG: personally reviewed my interpretation is NSR.   ASSESSMENT & PLAN:   Assessment & Plan by Problem: Principal Problem:   Acute hypoxic respiratory failure (HCC)  [name] [name] is a 68 YO F with a history of COPD and anxiety presenting with a 4-day history of nausea, vomiting, and diarrhea who was admitted for acute on chronic toxic respiratory failure.  #Acute hypoxic respiratory failure #Positive for influenza A #Respiratory Alkalosis, Compensated Presented with 4-day history of diarrhea, shortness of breath, nausea, and malaise likely secondary to influenza A.  While patient did endorse sputum production and oxygen requirements, unlikely to be COPD exacerbation as she not have wheezing on physical examination.  Will start her on Tamiflu for 5 days.  Will also have her work with PT/OT.  Patient noted to have had been on 3 L of  supplemental oxygen at night in the past, but no longer needs this.  She may need to be discharged with supplemental O2.  Will also start as needed DuoNebs to help with any shortness of breath.  Would likely need PFTs at discharge for COPD workup given her extensive smoking history (last PFTs were done in 2014 that were more suggestive of asthma). - Tamiflu 75 mg twice daily for 5 days (day 1/5) - Follow-up PT/OT - As needed DuoNebs every 4 hours - Follow-up BMP  Chronic Conditions: #  Anxiety: Home meds include Zoloft and Xanax. #Insomnia: Home med includes trazodone  Diet: Normal VTE:  Xarelto IVF: None,None Code: Full  Prior to Admission Living Arrangement: Home, living alone  Anticipated Discharge Location: Home Barriers to Discharge: O2 requirement   Dispo: Admit patient to Observation with expected length of stay less than 2 midnights.  Signed: Morrie Sheldon, MD Internal Medicine Resident PGY-1  01/08/2024, 4:28 PM

## 2024-01-08 NOTE — ED Notes (Signed)
 Charge nurse called at [facility] to make aware [facility] coming with pt.

## 2024-01-08 NOTE — ED Notes (Signed)
 Patient is being discharged from the Urgent Care and sent to the Emergency Department via [facility] . Per Denny Peon Mecum, PA-C, patient is in need of higher level of care due to hypoxia. Patient is aware and verbalizes understanding of plan of care.  Vitals:   01/08/24 1059  BP: 124/66  Pulse: 81  Resp: (!) 36  Temp: 98.2 F (36.8 C)  SpO2: (!) 89%

## 2024-01-08 NOTE — ED Provider Notes (Signed)
 [facility]    CSN: [id number] Arrival date & time: 01/08/24  4098      History   Chief Complaint Chief Complaint  Patient presents with   Cough   Nasal Congestion   Shortness of Breath   Headache   Diarrhea    HPI [name] [name] is a 68 y.o. female.   HPI  Discussed the use of AI scribe software for clinical note transcription with the patient, who gave verbal consent to proceed. The patient presents with severe headache and back pain.  She has been experiencing severe headaches for the past four days, localized to the right side of her head, described as 'real, real bad.' The headaches are intermittent but very intense when they occur. She has been using Tylenol for symptom relief.  She reports significant back pain contributing to her overall discomfort. The back pain has been persistent over the same period as the headaches.  She experiences nausea and vomiting, unable to keep down food or drink, including [employer] and chicken noodle soup. Her last meal was last Tuesday, indicating a significant decrease in appetite.  She has a history of using oxygen months ago but has not required it recently until now. She has not used her albuterol inhaler more frequently in the last few days and is almost out of it. She does not use a daily inhaler. No recent increase in albuterol inhaler use. She reports chest pain when coughing.  She mentions sweating and experiencing cold chills once, but not severely. She has not had a fever. No recent exposure to sick contacts.  She reports her oxygen has dropped to 84% last night at home and she is there alone without care or support so she is worried for worsening symptoms     Past Medical History:  Diagnosis Date   Anxiety    Asthma    Chronic, with restrictive component 2/2 obesity   COPD (chronic obstructive pulmonary disease) (HCC)    Depression    GERD (gastroesophageal reflux disease)    Headache     Insomnia    Obesity    On home oxygen therapy    "3L at night only when I get sick" (04/30/2014)   Shortness of breath dyspnea    Sleep apnea    "couldn't afford mask so I didn't get it" (04/30/2014)    Patient Active Problem List   Diagnosis Date Noted   Unilateral primary osteoarthritis, left knee 12/12/2023   Strain of left latissimus dorsi muscle 11/23/2023   Sepsis due to gram-negative UTI (HCC) 07/17/2023   Polypharmacy 07/17/2023   Traumatic rhabdomyolysis (HCC) 07/17/2023   Elevated liver enzymes 07/17/2023   Anxiety state 07/17/2023   Urinary incontinence 04/14/2023   Falls 04/13/2023   Thrombocytopenia (HCC) 04/13/2023   Bacteremia due to Escherichia coli 02/07/2023   Osteopenia 09/16/2022   Pre-diabetes 08/01/2022   Posterior right knee pain 02/06/2019   OCD (obsessive compulsive disorder) 08/13/2018   Aortic atherosclerosis (HCC) 08/17/2016   Obesity hypoventilation syndrome (HCC) 01/21/2015   Healthcare maintenance 07/01/2014   OSA (obstructive sleep apnea) 04/04/2013   Anxiety disorder 07/02/2012   Depression 06/28/2012   Morbid obesity (HCC) 03/02/2012   GERD (gastroesophageal reflux disease) 02/05/2012   Smoking 02/05/2012    Past Surgical History:  Procedure Laterality Date   APPENDECTOMY  ~ 2002   VAGINAL HYSTERECTOMY  ~ 1978    OB History   No obstetric history on file.      Home Medications  Prior to Admission medications   Medication Sig Start Date End Date Taking? Authorizing Provider  acetaminophen (TYLENOL) 500 MG tablet Take 1,000 mg by mouth every 6 (six) hours as needed (for pain OR back pain).    Yes [provider]  albuterol (VENTOLIN HFA) 108 (90 Base) MCG/ACT inhaler Inhale 2 puffs into the lungs every 6 (six) hours as needed for wheezing or shortness of breath. 06/16/20  Yes Remo Lipps, MD  gabapentin (NEURONTIN) 400 MG capsule TAKE 1 CAPSULE(400 MG) BY MOUTH TWICE DAILY 01/07/24  Yes Hurst, Teresa T, PA-C  sertraline  (ZOLOFT) 100 MG tablet TAKE 1 TABLET BY MOUTH EVERY DAY 12/04/23  Yes Hurst, Teresa T, PA-C  traZODone (DESYREL) 150 MG tablet Take 1 tablet (150 mg total) by mouth at bedtime. 08/11/23  Yes Hurst, Rosey Bath T, PA-C  ALPRAZolam Prudy Feeler) 1 MG tablet Take 1 tablet (1 mg total) by mouth 2 (two) times daily as needed for anxiety. 11/06/23   [name], PA-C  atorvastatin (LIPITOR) 10 MG tablet TAKE 1 TABLET(10 MG) BY MOUTH DAILY Patient not taking: Reported on 08/11/2023 04/12/23   [name], [name], DO  diclofenac Sodium (VOLTAREN) 1 % GEL Apply 2 g topically 4 (four) times daily. 11/23/23   [name], [name], DO  lidocaine (LIDODERM) 5 % Place 1 patch onto the skin every 12 (twelve) hours. Remove & Discard patch within 12 hours or as directed by MD 11/23/23 11/22/24  [name], [name], DO  mirabegron ER (MYRBETRIQ) 25 MG TB24 tablet Take 1 tablet (25 mg total) by mouth daily. 08/07/23   Rocky Morel, DO  OZEMPIC, 1 MG/DOSE, 4 MG/3ML SOPN INJECT 1 MG UNDER THE SKIN ONE DAY A WEEK 01/08/24   [name], [name], DO  pantoprazole (PROTONIX) 40 MG tablet TAKE 1 TABLET(40 MG) BY MOUTH DAILY 04/12/23   [name], [name], DO    Family History Family History  Problem Relation Age of Onset   Breast cancer Mother    Alcohol abuse Mother    Emphysema Mother        smoked   Asthma Mother    Breast cancer Maternal Aunt    Breast cancer Maternal Grandmother     Social History Social History   Tobacco Use   Smoking status: Former    Current packs/day: 0.00    Types: Cigarettes    Quit date: 11/07/1968    Years since quitting: 55.2   Smokeless tobacco: Never  Vaping Use   Vaping status: Never Used  Substance Use Topics   Alcohol use: No    Alcohol/week: 0.0 standard drinks of alcohol   Drug use: No     Allergies   Ibuprofen and Clonazepam   Review of Systems Review of Systems   Physical Exam Triage Vital Signs ED Triage Vitals  Encounter Vitals Group     BP 01/08/24 1059 124/66     Systolic BP Percentile  --      Diastolic BP Percentile --      Pulse Rate 01/08/24 1059 81     Resp 01/08/24 1059 (!) 36     Temp 01/08/24 1059 98.2 F (36.8 C)     Temp Source 01/08/24 1059 Oral     SpO2 01/08/24 1059 (!) 89 %     Weight --      Height --      Head Circumference --      Peak Flow --      Pain Score 01/08/24 1057 10     Pain  Loc --      Pain Education --      Exclude from Growth Chart --    No data found.  Updated Vital Signs BP 124/66 (BP Location: Right Arm)   Pulse 81   Temp 98.2 F (36.8 C) (Oral)   Resp (!) 36   SpO2 (!) 89%   Visual Acuity Right Eye Distance:   Left Eye Distance:   Bilateral Distance:    Right Eye Near:   Left Eye Near:    Bilateral Near:     Physical Exam Vitals reviewed.  Constitutional:      General: She is awake.     Appearance: She is well-developed and well-groomed. She is ill-appearing.  HENT:     Head: Normocephalic and atraumatic.  Cardiovascular:     Rate and Rhythm: Normal rate and regular rhythm.     Pulses:          Radial pulses are 2+ on the right side and 2+ on the left side.     Heart sounds: Heart sounds are distant.     Comments: Difficult to assess heart sounds due to breathing effort Pulmonary:     Effort: Tachypnea present. No respiratory distress.     Breath sounds: Decreased air movement present. Decreased breath sounds and rales present. No wheezing or rhonchi.  Skin:    General: Skin is warm and dry.  Neurological:     Mental Status: She is alert and oriented to person, place, and time.     GCS: GCS eye subscore is 4. GCS verbal subscore is 5. GCS motor subscore is 6.  Psychiatric:        Attention and Perception: Attention and perception normal.        Mood and Affect: Mood is anxious.        Speech: Speech normal.        Behavior: Behavior normal. Behavior is cooperative.      UC Treatments / Results  Labs (all labs ordered are listed, but only abnormal results are displayed) Labs Reviewed - No data to  display   EKG   Radiology No results found.  Procedures Procedures (including critical care time)  Medications Ordered in UC Medications - No data to display  Initial Impression / Assessment and Plan / UC Course  I have reviewed the triage vital signs and the nursing notes.  Pertinent labs & imaging results that were available during my care of the patient were reviewed by me and considered in my medical decision making (see chart for details).      Final Clinical Impressions(s) / UC Diagnoses   Final diagnoses:  Decreased breath sounds  Hypoxia    Acute Respiratory Distress Reports feeling terrible for four days with symptoms including sweating, chills, and severe right-sided headache. Oxygen saturation improved to 93% with supplemental oxygen. Uses albuterol inhaler but no recent use. Prefers hospital evaluation over in-office nebulizer treatment and xray imaging. Discussed risks and benefits of hospital evaluation versus in-office treatment. Prefers hospital evaluation for comprehensive care. - Call EMS for hospital transport - Provide supplemental oxygen while waiting for EMS  Chest Pain Reports chest pain, particularly when coughing. Prefers hospital evaluation for further assessment and management. - Evaluate chest pain at the hospital  Severe Headache Reports severe, intermittent right-sided headache, described as extremely intense when it occurs. Prefers hospital evaluation for further assessment and management. - Evaluate headache at the hospital  Nausea and Vomiting Reports nausea and vomiting, unable to keep  down food or drink. Last substantial meal was nearly a week ago. Prefers hospital evaluation for comprehensive care. - Evaluate nausea and vomiting at the hospital  Follow-up - Call EMS for hospital transport.     Discharge Instructions      At this time you are having significant trouble breathing and reduced oxygen. You have requested transport  to the ED for further evaluation and we have called [facility] to take you there per your wishes.      ED Prescriptions   None    PDMP not reviewed this encounter.   Zelma Mazariego, Oswaldo Conroy, PA-C 01/08/24 1124

## 2024-01-08 NOTE — ED Notes (Signed)
 [facility] called for transport.

## 2024-01-09 ENCOUNTER — Encounter: Payer: Medicare HMO | Admitting: Rehabilitative and Restorative Service Providers"

## 2024-01-09 DIAGNOSIS — E873 Alkalosis: Secondary | ICD-10-CM | POA: Diagnosis present

## 2024-01-09 DIAGNOSIS — J101 Influenza due to other identified influenza virus with other respiratory manifestations: Secondary | ICD-10-CM | POA: Diagnosis present

## 2024-01-09 DIAGNOSIS — Z7985 Long-term (current) use of injectable non-insulin antidiabetic drugs: Secondary | ICD-10-CM | POA: Diagnosis not present

## 2024-01-09 DIAGNOSIS — Z803 Family history of malignant neoplasm of breast: Secondary | ICD-10-CM | POA: Diagnosis not present

## 2024-01-09 DIAGNOSIS — D696 Thrombocytopenia, unspecified: Secondary | ICD-10-CM | POA: Diagnosis present

## 2024-01-09 DIAGNOSIS — J111 Influenza due to unidentified influenza virus with other respiratory manifestations: Secondary | ICD-10-CM | POA: Diagnosis present

## 2024-01-09 DIAGNOSIS — Z6830 Body mass index (BMI) 30.0-30.9, adult: Secondary | ICD-10-CM | POA: Diagnosis not present

## 2024-01-09 DIAGNOSIS — G4730 Sleep apnea, unspecified: Secondary | ICD-10-CM | POA: Diagnosis present

## 2024-01-09 DIAGNOSIS — J9601 Acute respiratory failure with hypoxia: Secondary | ICD-10-CM | POA: Diagnosis present

## 2024-01-09 DIAGNOSIS — Z79899 Other long term (current) drug therapy: Secondary | ICD-10-CM | POA: Diagnosis not present

## 2024-01-09 DIAGNOSIS — F419 Anxiety disorder, unspecified: Secondary | ICD-10-CM | POA: Diagnosis present

## 2024-01-09 DIAGNOSIS — Z886 Allergy status to analgesic agent status: Secondary | ICD-10-CM | POA: Diagnosis not present

## 2024-01-09 DIAGNOSIS — Z888 Allergy status to other drugs, medicaments and biological substances status: Secondary | ICD-10-CM | POA: Diagnosis not present

## 2024-01-09 DIAGNOSIS — G4700 Insomnia, unspecified: Secondary | ICD-10-CM | POA: Diagnosis present

## 2024-01-09 DIAGNOSIS — K219 Gastro-esophageal reflux disease without esophagitis: Secondary | ICD-10-CM | POA: Diagnosis present

## 2024-01-09 DIAGNOSIS — Z825 Family history of asthma and other chronic lower respiratory diseases: Secondary | ICD-10-CM | POA: Diagnosis not present

## 2024-01-09 DIAGNOSIS — Z87891 Personal history of nicotine dependence: Secondary | ICD-10-CM | POA: Diagnosis not present

## 2024-01-09 DIAGNOSIS — Z811 Family history of alcohol abuse and dependence: Secondary | ICD-10-CM | POA: Diagnosis not present

## 2024-01-09 DIAGNOSIS — E669 Obesity, unspecified: Secondary | ICD-10-CM | POA: Diagnosis present

## 2024-01-09 DIAGNOSIS — J4489 Other specified chronic obstructive pulmonary disease: Secondary | ICD-10-CM | POA: Diagnosis present

## 2024-01-09 DIAGNOSIS — F32A Depression, unspecified: Secondary | ICD-10-CM | POA: Diagnosis present

## 2024-01-09 LAB — BASIC METABOLIC PANEL
Anion gap: 8 (ref 5–15)
BUN: 6 mg/dL — ABNORMAL LOW (ref 8–23)
CO2: 31 mmol/L (ref 22–32)
Calcium: 8.7 mg/dL — ABNORMAL LOW (ref 8.9–10.3)
Chloride: 99 mmol/L (ref 98–111)
Creatinine, Ser: 0.61 mg/dL (ref 0.44–1.00)
GFR, Estimated: >60 mL/min (ref >60)
Glucose, Bld: 109 mg/dL — ABNORMAL HIGH (ref 70–99)
Potassium: 3.8 mmol/L (ref 3.5–5.1)
Sodium: 138 mmol/L (ref 135–145)

## 2024-01-09 LAB — CBC
HCT: 45.3 % (ref 36.0–46.0)
Hemoglobin: 15.7 g/dL — ABNORMAL HIGH (ref 12.0–15.0)
MCH: 31.3 pg (ref 26.0–34.0)
MCHC: 34.7 g/dL (ref 30.0–36.0)
MCV: 90.2 fL (ref 80.0–100.0)
Platelets: 88 10*3/uL — ABNORMAL LOW (ref 150–400)
RBC: 5.02 MIL/uL (ref 3.87–5.11)
RDW: 13.2 % (ref 11.5–15.5)
WBC: 3.5 10*3/uL — ABNORMAL LOW (ref 4.0–10.5)
nRBC: 0 % (ref 0.0–0.2)

## 2024-01-09 LAB — MAGNESIUM: Magnesium: 2.1 mg/dL (ref 1.7–2.4)

## 2024-01-09 MED ORDER — GUAIFENESIN 100 MG/5ML PO LIQD
5.0000 mL | ORAL | Status: DC | PRN
  Administered 2024-01-09 – 2024-01-11 (×5): 5 mL via ORAL
  Filled 2024-01-09 (×5): qty 15

## 2024-01-09 NOTE — Progress Notes (Signed)
   01/09/24 1333  TOC Brief Assessment  Insurance and Status Reviewed  Patient has primary care physician Yes  Home environment has been reviewed cares for husband  Prior level of function: independent  Prior/Current Home Services No current home services  Social Drivers of Health Review SDOH reviewed no interventions necessary  Transition of care needs  (await PT recommendations)   Spoke to patient at bedside.   Patient from home , is her husband's caregiver. Currently her husband is at [place] , with plans to return to home the beginning of next week.   Patient has cane , walker, and shower chair at home. Patient does not have home oxygen.    Await PT recommendations.

## 2024-01-09 NOTE — Progress Notes (Signed)
 Additional PT Note:  SATURATION QUALIFICATIONS: (This note is used to comply with regulatory documentation for home oxygen)  Patient Saturations on Room Air at Rest = 88%  Patient Saturations on Room Air while Ambulating = 86%  Patient Saturations on 4 Liters of oxygen while Ambulating = 93%  Please briefly explain why patient needs home oxygen: Pt with desaturation followed by weakness and fatigue while ambulating on RA. O2 sats remain in 90's when pt ambulates on 4L O2.   Lyanne Co, PT  Acute Rehab Services Secure chat preferred Office [phone]

## 2024-01-09 NOTE — Plan of Care (Signed)

## 2024-01-09 NOTE — Evaluation (Addendum)
 Occupational Therapy Evaluation Patient Details Name: [name] [name] MRN: [id number] DOB: [birth year] Today's Date: 01/09/2024   History of Present Illness   [name] [name] is a 68 YO F admitted 01/08/24 with 4-day history of nausea, SOB, vomiting, and diarrhea who was dx with acute hypoxic respiratory failure and positive for influenza A. PMH includes Anxiety, Asthma, COPD, Depression, Obesity, On home oxygen therapy, and Sleep apnea.     Clinical Impressions Pt is typically mod I with quad cane for community mobility, drives, completes all ADL/IADL. Does use a shower chair. Today Pt presents with decreased activity tolerance, increased O2 needs from baseline, generalized weakness. Pt able to demonstrate LB and UB dressing (set up from seated position), toilet transfer with peri care (GCA) and clothing management (GCA), grooming tasks (set up from seated position), and mobility with quad cane (although reaches with other hand for environmental support - recommend RW use for now). At this time recommending continued OT in the acute setting and rehab of <3 hours daily post-acute to maximize safety and independence in ADL and functional transfers as she needs to be at her PLOF since her husband is WC bound.     If plan is discharge home, recommend the following:   A little help with walking and/or transfers;A little help with bathing/dressing/bathroom;Assistance with cooking/housework;Assist for transportation     Functional Status Assessment   Patient has had a recent decline in their functional status and demonstrates the ability to make significant improvements in function in a reasonable and predictable amount of time.     Equipment Recommendations   None recommended by OT (Pt has appropriate DME)     Recommendations for Other Services   PT consult     Precautions/Restrictions   Precautions Precautions: Fall Recall of Precautions/Restrictions:  Intact Precaution/Restrictions Comments: droplet Restrictions Weight Bearing Restrictions Per Provider Order: No     Mobility Bed Mobility Overal bed mobility: Modified Independent                  Transfers Overall transfer level: Needs assistance Equipment used: Quad cane Transfers: Sit to/from Stand Sit to Stand: Contact guard assist           General transfer comment: guard for safety and balance, no assist with power up, line management      Balance Overall balance assessment: Needs assistance Sitting-balance support: No upper extremity supported, Feet supported Sitting balance-Leahy Scale: Good     Standing balance support: Single extremity supported, Bilateral upper extremity supported, During functional activity, Reliant on assistive device for balance Standing balance-Leahy Scale: Poor                             ADL either performed or assessed with clinical judgement   ADL Overall ADL's : Needs assistance/impaired Eating/Feeding: Modified independent   Grooming: Wash/dry hands;Wash/dry face;Set up;Sitting Grooming Details (indicate cue type and reason): Pt DOE 2/4 with mobility to bathroom, "I am just so tired" Pt requested to sit down Upper Body Bathing: Supervision/ safety;Sitting   Lower Body Bathing: Set up;Sitting/lateral leans   Upper Body Dressing : Set up;Sitting Upper Body Dressing Details (indicate cue type and reason): extra gown like robe Lower Body Dressing: Set up;Sitting/lateral leans Lower Body Dressing Details (indicate cue type and reason): socks Toilet Transfer: Contact guard assist;Ambulation (quad cane)   Toileting- Clothing Manipulation and Hygiene: Contact guard assist;Sit to/from stand Toileting - Clothing Manipulation Details (indicate cue type and reason):  able to perform peri care in sitting and manage diaper sit<>stand     Functional mobility during ADLs: Contact guard assist (quad cane, reaches for  additional external balance assist) General ADL Comments: decreased activity tolerance, new O2 needs.     Vision Ability to See in Adequate Light: 0 Adequate Patient Visual Report: No change from baseline Vision Assessment?: No apparent visual deficits     Perception         Praxis         Pertinent Vitals/Pain Pain Assessment Pain Assessment: No/denies pain     Extremity/Trunk Assessment Upper Extremity Assessment Upper Extremity Assessment: Generalized weakness   Lower Extremity Assessment Lower Extremity Assessment: Defer to PT evaluation   Cervical / Trunk Assessment Cervical / Trunk Assessment: Other exceptions (Pt has lost over 100 lbs in the past year)   Communication Communication Communication: No apparent difficulties   Cognition Arousal: Alert Behavior During Therapy: WFL for tasks assessed/performed Cognition: No apparent impairments             OT - Cognition Comments: suspect low health literacy                 Following commands: Intact       Cueing  General Comments   Cueing Techniques: Verbal cues  Pt on 4L O2 via [place], SpO2 91%. On RA dropped to 82% Pt DOE 2/4 but able to maintain SpO2 >90% on 4L   Exercises     Shoulder Instructions      Home Living Family/patient expects to be discharged to:: Private residence Living Arrangements: Spouse/significant other (currently in SNF (comes home next week, WC bound for the past year)) Available Help at Discharge: Friend(s);Available PRN/intermittently Type of Home: House Home Access: Ramped entrance     Home Layout: One level     Bathroom Shower/Tub: Chief Strategy Officer: Standard     Home Equipment: Cane - quad;Shower Counsellor (2 wheels)          Prior Functioning/Environment Prior Level of Function : Independent/Modified Independent;Driving             Mobility Comments: She used a quad cane for ambulation outside the home. Could ambulate  in community ADLs Comments: She was modified independent to independent with ADLs, cooking, cleaning, and driving.    OT Problem List: Decreased activity tolerance;Impaired balance (sitting and/or standing);Decreased safety awareness;Cardiopulmonary status limiting activity   OT Treatment/Interventions: Self-care/ADL training;Energy conservation;DME and/or AE instruction;Therapeutic activities;Patient/family education;Balance training      OT Goals(Current goals can be found in the care plan section)   Acute Rehab OT Goals Patient Stated Goal: get stronger and get home so she can take care of husband OT Goal Formulation: With patient Time For Goal Achievement: 01/23/24 Potential to Achieve Goals: Good ADL Goals Pt Will Perform Grooming: with modified independence;standing Pt Will Perform Upper Body Dressing: with modified independence;sitting Pt Will Perform Lower Body Dressing: with modified independence;sit to/from stand Pt Will Transfer to Toilet: with modified independence Pt Will Perform Toileting - Clothing Manipulation and hygiene: with modified independence;sit to/from stand Additional ADL Goal #1: Pt will verbalize at least 3 strategies for energy conservation during ADL routine with no cues   OT Frequency:  Min 1X/week    Co-evaluation              AM-PAC OT "6 Clicks" Daily Activity     Outcome Measure Help from another person eating meals?: None Help from another person taking  care of personal grooming?: A Little Help from another person toileting, which includes using toliet, bedpan, or urinal?: A Little Help from another person bathing (including washing, rinsing, drying)?: A Little Help from another person to put on and taking off regular upper body clothing?: A Little Help from another person to put on and taking off regular lower body clothing?: A Little 6 Click Score: 19   End of Session Equipment Utilized During Treatment: Gait belt;Other (comment)  (quad cane) Nurse Communication: Mobility status  Activity Tolerance: Patient tolerated treatment well Patient left: in chair;with call bell/phone within reach  OT Visit Diagnosis: Unsteadiness on feet (R26.81);Muscle weakness (generalized) (M62.81)                Time: 1610-9604 OT Time Calculation (min): 25 min Charges:  OT General Charges $OT Visit: 1 Visit OT Evaluation $OT Eval Low Complexity: 1 Low  Nyoka Cowden OTR/L Acute Rehabilitation Services Office: [phone]   [facility] 01/09/2024, 10:11 AM

## 2024-01-09 NOTE — Evaluation (Signed)
 Physical Therapy Evaluation Patient Details Name: [name] [name] MRN: [id number] DOB: [birth year] Today's Date: 01/09/2024  History of Present Illness  [name] [name] is a 68 YO F admitted 01/08/24 with 4-day history of nausea, SOB, vomiting, and diarrhea who was dx with acute hypoxic respiratory failure and positive for influenza A. PMH includes Anxiety, Asthma, COPD, Depression, Obesity, On home oxygen therapy, and Sleep apnea.  Clinical Impression  Pt admitted with above diagnosis. Pt from home where she has had falls. Her husband is at [facility] and is supposed to come home in a week, he has been w/c bound past year and has also had falls. Pt presents with weakness and very poor stamina compared to her baseline. Pt unsteady with quad cane which she used PTA. Needed RW for safety. Desat to 86% with 10' ambulation on RA. Pt could not ambulate more than 20' without needing to sit due to increased WOB. Do not feel she will currently be safe at home alone and needs to be fully independent when husband returns. Patient will benefit from continued inpatient follow up therapy, <3 hours/day.  Pt currently with functional limitations due to the deficits listed below (see PT Problem List). Pt will benefit from acute skilled PT to increase their independence and safety with mobility to allow discharge.           If plan is discharge home, recommend the following: A little help with walking and/or transfers;A little help with bathing/dressing/bathroom;Assistance with cooking/housework;Assist for transportation   Can travel by private vehicle   Yes    Equipment Recommendations None recommended by PT  Recommendations for Other Services       Functional Status Assessment Patient has had a recent decline in their functional status and demonstrates the ability to make significant improvements in function in a reasonable and predictable amount of time.     Precautions / Restrictions  Precautions Precautions: Fall Recall of Precautions/Restrictions: Intact Precaution/Restrictions Comments: droplet Restrictions Weight Bearing Restrictions Per Provider Order: No      Mobility  Bed Mobility Overal bed mobility: Modified Independent                  Transfers Overall transfer level: Needs assistance Equipment used: Quad cane, Rolling walker (2 wheels) Transfers: Sit to/from Stand Sit to Stand: Contact guard assist           General transfer comment: CGA for safety and pt reaching for stable surfaces with free hand    Ambulation/Gait Ambulation/Gait assistance: Min assist Gait Distance (Feet): 40 Feet (10', 10', 20') Assistive device: Rolling walker (2 wheels), Quad cane Gait Pattern/deviations: Step-through pattern, Decreased stride length Gait velocity: decreased Gait velocity interpretation: <1.31 ft/sec, indicative of household ambulator   General Gait Details: pt unsteady with use of quad cane, reaching for surfaces with free hand, increased WOB noted with ambulation. Pt fatigues very quickly. Could not ambulate more than 20' at one time. More steady with RW which will also help energy conservation  Stairs            Wheelchair Mobility     Tilt Bed    Modified Rankin (Stroke Patients Only)       Balance Overall balance assessment: Needs assistance Sitting-balance support: No upper extremity supported, Feet supported Sitting balance-Leahy Scale: Good     Standing balance support: Single extremity supported, Bilateral upper extremity supported, During functional activity, Reliant on assistive device for balance Standing balance-Leahy Scale: Poor Standing balance comment: pt unsteady in standing, esp  when on RA.                             Pertinent Vitals/Pain Pain Assessment Pain Assessment: No/denies pain    Home Living Family/patient expects to be discharged to:: Private residence Living Arrangements:  Spouse/significant other (currently in SNF (comes home next week, WC bound for the past year)) Available Help at Discharge: Friend(s);Available PRN/intermittently Type of Home: House Home Access: Ramped entrance       Home Layout: One level Home Equipment: Cane - quad;Shower seat;Rolling Environmental consultant (2 wheels) Additional Comments: Her spouse is currently in a SNF. They have both had falls at home in past year. 4 months ago pt passed out in shower and did not awake until next morning and was hospitalized    Prior Function Prior Level of Function : Independent/Modified Independent;Driving             Mobility Comments: She used a quad cane for ambulation outside the home. Could ambulate in community ADLs Comments: She was modified independent to independent with ADLs, cooking, cleaning, and driving.     Extremity/Trunk Assessment   Upper Extremity Assessment Upper Extremity Assessment: Generalized weakness    Lower Extremity Assessment Lower Extremity Assessment: Generalized weakness    Cervical / Trunk Assessment Cervical / Trunk Assessment: Other exceptions (Pt has lost over 100 lbs in the past year)  Communication   Communication Communication: No apparent difficulties    Cognition Arousal: Alert Behavior During Therapy: WFL for tasks assessed/performed   PT - Cognitive impairments: No apparent impairments                         Following commands: Intact       Cueing Cueing Techniques: Verbal cues     General Comments General comments (skin integrity, edema, etc.): SPO2 with ambulation on RA 86%, SPO2 with ambulation on 4L O2 93%, HR in 90's    Exercises     Assessment/Plan    PT Assessment Patient needs continued PT services  PT Problem List Decreased strength;Decreased activity tolerance;Decreased balance;Decreased mobility;Cardiopulmonary status limiting activity       PT Treatment Interventions DME instruction;Gait training;Stair  training;Functional mobility training;Therapeutic activities;Therapeutic exercise;Balance training;Patient/family education    PT Goals (Current goals can be found in the Care Plan section)  Acute Rehab PT Goals Patient Stated Goal: be safe, not fall PT Goal Formulation: With patient Time For Goal Achievement: 01/23/24 Potential to Achieve Goals: Good    Frequency Min 2X/week     Co-evaluation               AM-PAC PT "6 Clicks" Mobility  Outcome Measure Help needed turning from your back to your side while in a flat bed without using bedrails?: None Help needed moving from lying on your back to sitting on the side of a flat bed without using bedrails?: None Help needed moving to and from a bed to a chair (including a wheelchair)?: A Little Help needed standing up from a chair using your arms (e.g., wheelchair or bedside chair)?: A Little Help needed to walk in hospital room?: A Lot Help needed climbing 3-5 steps with a railing? : Total 6 Click Score: 17    End of Session Equipment Utilized During Treatment: Oxygen;Gait belt Activity Tolerance: Patient limited by fatigue Patient left: in bed;with call bell/phone within reach Nurse Communication: Mobility status PT Visit Diagnosis: History of falling (Z91.81);Difficulty  in walking, not elsewhere classified (R26.2);Muscle weakness (generalized) (M62.81);Unsteadiness on feet (R26.81)    Time: 1610-9604 PT Time Calculation (min) (ACUTE ONLY): 34 min   Charges:   PT Evaluation $PT Eval Moderate Complexity: 1 Mod PT Treatments $Gait Training: 8-22 mins PT General Charges $$ ACUTE PT VISIT: 1 Visit         Lyanne Co, PT  Acute Rehab Services Secure chat preferred Office [phone]   Lawana Chambers Citlalic Norlander 01/09/2024, 2:27 PM

## 2024-01-09 NOTE — Care Management Obs Status (Signed)
 MEDICARE OBSERVATION STATUS NOTIFICATION   Patient Details  Name: [name] [name] MRN: [id number] Date of Birth: 1956-06-13   Medicare Observation Status Notification Given:  Yes    Kingsley Plan, RN 01/09/2024, 1:33 PM

## 2024-01-09 NOTE — TOC Initial Note (Signed)
 Transition of Care [facility]) - Initial/Assessment Note    Patient Details  Name: [name] [name] MRN: [id number] Date of Birth: [birth year]  [facility]) CM/SW Contact:    Marliss Coots, LCSW Phone Number: 01/09/2024, 4:49 PM  Clinical Narrative:                  4:49 PM CSW spoke with patient regarding disposition plan and informed her of physical therapy recommendation of discharge to SNF. Patient expressed concerns of discharging to SNF as her husband is to discharge from [facility] and wants to be at home upon his discharge as she is primary caretaker (son resides in [place]) as well as caretaker of their two cats. CSW relayed concerns to medical team.  Expected Discharge Plan: Home w Home Health Services Barriers to Discharge: Continued Medical Work up   Patient Goals and CMS Choice Patient states their goals for this hospitalization and ongoing recovery are:: to go home          Expected Discharge Plan and Services In-house Referral: Clinical Social Work   Post Acute Care Choice: Home Health Living arrangements for the past 2 months: Single Family Home                                      Prior Living Arrangements/Services Living arrangements for the past 2 months: Single Family Home Lives with:: Self, Spouse Patient language and need for interpreter reviewed:: Yes Do you feel safe going back to the place where you live?: Yes            Criminal Activity/Legal Involvement Pertinent to Current Situation/Hospitalization: No - Comment as needed  Activities of Daily Living   ADL Screening (condition at time of admission) Independently performs ADLs?: Yes (appropriate for developmental age) Is the patient deaf or have difficulty hearing?: No Does the patient have difficulty seeing, even when wearing glasses/contacts?: No Does the patient have difficulty concentrating, remembering, or making decisions?: No  Permission  Sought/Granted Permission sought to share information with : Family Supports Permission granted to share information with : No (Contact information on chart)  Share Information with NAME: Felicie Morn     Permission granted to share info w Relationship: Spouse  Permission granted to share info w Contact Information: [phone]  Emotional Assessment   Attitude/Demeanor/Rapport: Engaged Affect (typically observed): Accepting, Appropriate, Stable, Calm, Pleasant Orientation: : Oriented to Self, Oriented to Place, Oriented to  Time, Oriented to Situation Alcohol / Substance Use: Not Applicable Psych Involvement: No (comment)  Admission diagnosis:  Influenza [J11.1] Acute hypoxic respiratory failure (HCC) [J96.01] Patient Active Problem List   Diagnosis Date Noted   Acute hypoxic respiratory failure (HCC) 01/08/2024   Unilateral primary osteoarthritis, left knee 12/12/2023   Strain of left latissimus dorsi muscle 11/23/2023   Sepsis due to gram-negative UTI (HCC) 07/17/2023   Polypharmacy 07/17/2023   Traumatic rhabdomyolysis (HCC) 07/17/2023   Elevated liver enzymes 07/17/2023   Anxiety state 07/17/2023   Urinary incontinence 04/14/2023   Falls 04/13/2023   Thrombocytopenia (HCC) 04/13/2023   Bacteremia due to Escherichia coli 02/07/2023   Osteopenia 09/16/2022   Pre-diabetes 08/01/2022   Posterior right knee pain 02/06/2019   OCD (obsessive compulsive disorder) 08/13/2018   Aortic atherosclerosis (HCC) 08/17/2016   Obesity hypoventilation syndrome (HCC) 01/21/2015   Healthcare maintenance 07/01/2014   OSA (obstructive sleep apnea) 04/04/2013   Anxiety disorder 07/02/2012  Depression 06/28/2012   Morbid obesity (HCC) 03/02/2012   GERD (gastroesophageal reflux disease) 02/05/2012   Smoking 02/05/2012   PCP:  Rudene Christians, DO Pharmacy:   [facility] [phone] - [place], [place] - [address] AT [facility] [address] [facility] [address] [place]  [address] Phone: [phone] Fax: [phone]     Social Drivers of Health (SDOH) Social History: SDOH Screenings   Food Insecurity: No Food Insecurity (01/08/2024)  Housing: Low Risk  (01/08/2024)  Transportation Needs: No Transportation Needs (01/08/2024)  Utilities: Not At Risk (01/08/2024)  Alcohol Screen: Low Risk  (12/07/2022)  Depression (PHQ2-9): Low Risk  (11/23/2023)  Financial Resource Strain: Medium Risk (12/07/2022)  Physical Activity: Insufficiently Active (12/07/2022)  Social Connections: Socially Integrated (01/08/2024)  Stress: No Stress Concern Present (12/07/2022)  Tobacco Use: Medium Risk (01/08/2024)   SDOH Interventions:     Readmission Risk Interventions    07/14/2023    3:15 PM  Readmission Risk Prevention Plan  Transportation Screening Complete  PCP or Specialist Appt within 5-7 Days Complete  Home Care Screening Complete  Medication Review (RN CM) Complete

## 2024-01-09 NOTE — Plan of Care (Signed)
 Patient alert/oriented X4. Patient compliant with medication administration and ambulated around room unassisted. Patient remains on 4L nasal cannula. Guaifenesin administered as needed for coughing/ increased mucus accumulation. VSS, no complaints at this time.   Problem: Education: Goal: Knowledge of General Education information will improve Description: Including pain rating scale, medication(s)/side effects and non-pharmacologic comfort measures Outcome: Progressing   Problem: Health Behavior/Discharge Planning: Goal: Ability to manage health-related needs will improve Outcome: Progressing   Problem: Clinical Measurements: Goal: Ability to maintain clinical measurements within normal limits will improve Outcome: Progressing   Problem: Clinical Measurements: Goal: Will remain free from infection Outcome: Progressing   Problem: Clinical Measurements: Goal: Diagnostic test results will improve Outcome: Progressing   Problem: Clinical Measurements: Goal: Respiratory complications will improve Outcome: Progressing   Problem: Clinical Measurements: Goal: Cardiovascular complication will be avoided Outcome: Progressing   Problem: Activity: Goal: Risk for activity intolerance will decrease Outcome: Progressing   Problem: Nutrition: Goal: Adequate nutrition will be maintained Outcome: Progressing   Problem: Coping: Goal: Level of anxiety will decrease Outcome: Progressing   Problem: Elimination: Goal: Will not experience complications related to bowel motility Outcome: Progressing   Problem: Elimination: Goal: Will not experience complications related to urinary retention Outcome: Progressing   Problem: Pain Managment: Goal: General experience of comfort will improve and/or be controlled Outcome: Progressing   Problem: Safety: Goal: Ability to remain free from injury will improve Outcome: Progressing   Problem: Skin Integrity: Goal: Risk for impaired skin  integrity will decrease Outcome: Progressing

## 2024-01-09 NOTE — Progress Notes (Cosign Needed)
 HD#1 SUBJECTIVE:  Patient Summary: [name] [name] is a 68 YO F with a history of COPD and anxiety presenting with a 4-day history of nausea, SOB, vomiting, and diarrhea who was admitted for acute hypoxic respiratory failure.   Overnight Events: No acute events overnight  Interim History: Patient was evaluated at bedside.  Notes continued shortness of breath with exertion but improved from yesterday.  Expresses concerns with going home as her son will not be able to pick her up until tomorrow.  She also continues to endorse profound fatigue but denies any chest pain, nausea, vomiting, or any other signs or symptoms.  OBJECTIVE:  Vital Signs: Vitals:   01/09/24 2049 01/10/24 0443 01/10/24 0731 01/10/24 1542  BP: (!) 97/47 (!) 102/44 (!) 96/51 (!) 101/51  Pulse: 66 65 64 64  Resp: 18 18 18 18   Temp: 98.9 F (37.2 C) 98.7 F (37.1 C) 98.7 F (37.1 C) 98 F (36.7 C)  TempSrc: Oral Oral    SpO2: 91% (P) 90% 90% 91%  Weight:      Height:       Supplemental O2: Nasal Cannula SpO2: 91 % O2 Flow Rate (L/min): 2 L/min  Filed Weights   01/08/24 1224  Weight: 92.1 kg    No intake or output data in the 24 hours ending 01/10/24 1720  Net IO Since Admission: 1,000 mL [01/10/24 1720]  CBC    Component Value Date/Time   WBC 3.5 (L) 01/09/2024 0713   RBC 5.02 01/09/2024 0713   HGB 15.7 (H) 01/09/2024 0713   HGB 16.2 (H) 04/13/2023 1534   HCT 45.3 01/09/2024 0713   HCT 46.7 (H) 04/13/2023 1534   PLT 88 (L) 01/09/2024 0713   PLT 139 (L) 04/13/2023 1534   MCV 90.2 01/09/2024 0713   MCV 91 04/13/2023 1534   MCH 31.3 01/09/2024 0713   MCHC 34.7 01/09/2024 0713   RDW 13.2 01/09/2024 0713   RDW 13.4 04/13/2023 1534   LYMPHSABS 0.8 01/08/2024 1348   LYMPHSABS 1.8 04/13/2023 1534   MONOABS 0.5 01/08/2024 1348   EOSABS 0.0 01/08/2024 1348   EOSABS 0.1 04/13/2023 1534   BASOSABS 0.0 01/08/2024 1348   BASOSABS 0.0 04/13/2023 1534   CMP     Component Value Date/Time   NA 138  01/09/2024 0713   NA 139 08/07/2023 1456   K 3.8 01/09/2024 0713   CL 99 01/09/2024 0713   CO2 31 01/09/2024 0713   GLUCOSE 109 (H) 01/09/2024 0713   BUN 6 (L) 01/09/2024 0713   BUN 6 (L) 08/07/2023 1456   CREATININE 0.61 01/09/2024 0713   CREATININE 0.55 05/23/2014 1050   CALCIUM 8.7 (L) 01/09/2024 0713   PROT 6.0 08/07/2023 1456   ALBUMIN 3.9 08/07/2023 1456   AST 10 08/07/2023 1456   ALT 6 08/07/2023 1456   ALKPHOS 126 (H) 08/07/2023 1456   BILITOT 0.3 08/07/2023 1456   EGFR 103 08/07/2023 1456   GFRNONAA >60 01/09/2024 0713   GFRNONAA >89 05/23/2014 1050    Physical Exam: Physical Exam Constitutional:      Appearance: She is well-developed.  HENT:     Head: Normocephalic and atraumatic.  Cardiovascular:     Rate and Rhythm: Normal rate and regular rhythm.  Pulmonary:     Effort: Pulmonary effort is normal.     Breath sounds: Wheezing present.  Abdominal:     General: Bowel sounds are normal.     Palpations: Abdomen is soft.  Skin:    General: Skin  is warm.  Neurological:     General: No focal deficit present.     Mental Status: She is alert.  Psychiatric:        Mood and Affect: Mood normal.        Behavior: Behavior normal.    ASSESSMENT/PLAN:  Assessment: Principal Problem:   Acute hypoxic respiratory failure (HCC) Active Problems:   Influenza due to identified novel influenza A virus with other respiratory manifestations   Thrombocytopenia (HCC)  Plan: #Acute hypoxic respiratory failure secondary to influenza A Patient notes improvement in signs and symptoms this morning.  Still continues to endorse shortness of breath upon exertion without supplemental oxygen. On physical examination, also demonstrated wheezing and will likely need outpatient breathing treatments.  Will also likely need outpatient PFTs after discharge. Evaluated by OT who is recommending home health occupational therapy.  Physical therapy recommendations are still pending to further  assess outpatient oxygen requirements. - Follow-up PT - Continue DuoNebs every 4 hours as needed - Continue Tamiflu 75 mg twice daily (day 2/5)  Chronic Conditions: #Anxiety: Continue home Zoloft and Xanax. #Insomnia:Continue home trazodone  Best Practice: Diet: Regular diet IVF: Fluids: None, Rate: None VTE: rivaroxaban (XARELTO) tablet 10 mg Start: 01/09/24 1000 Code: Full AB: None Therapy Recs: Home Health, DME: none Family Contact: Son, to be notified. DISPO: Anticipated discharge tomorrow to Home pending New oxygen.  Signature: Morrie Sheldon, MD Internal Medicine Resident, PGY-1 [facility]  Pager: [phone]  Please contact the on call pager after 5 pm and on weekends at [phone].

## 2024-01-10 ENCOUNTER — Inpatient Hospital Stay (HOSPITAL_COMMUNITY)

## 2024-01-10 ENCOUNTER — Encounter (HOSPITAL_COMMUNITY): Payer: Self-pay | Admitting: Internal Medicine

## 2024-01-10 DIAGNOSIS — M279 Disease of jaws, unspecified: Secondary | ICD-10-CM | POA: Diagnosis not present

## 2024-01-10 DIAGNOSIS — Z803 Family history of malignant neoplasm of breast: Secondary | ICD-10-CM

## 2024-01-10 HISTORY — DX: Family history of malignant neoplasm of breast: Z80.3

## 2024-01-10 NOTE — Plan of Care (Signed)

## 2024-01-10 NOTE — Progress Notes (Signed)
 Nurse requested Mobility Specialist to perform oxygen saturation test with pt which includes removing pt from oxygen both at rest and while ambulating.  Below are the results from that testing.     Patient Saturations on Room Air at Rest = spO2 85%  Patient Saturations on Room Air while Ambulating = sp02 79% .  Rested and performed pursed lip breathing for 1 minute with sp02 at 81%.  Patient Saturations on 3 Liters of oxygen while Ambulating = sp02 93%  At end of testing pt left in room on 4 Liters of oxygen.  Reported results to nurse.

## 2024-01-10 NOTE — TOC Progression Note (Signed)
 Transition of Care [facility]) - Progression Note    Patient Details  Name: [name] [name] MRN: [id number] Date of Birth: 1956-07-12  [facility] of Care [facility]) CM/SW Contact  Marliss Coots, LCSW Phone Number: 01/10/2024, 10:08 AM  Clinical Narrative:     10:08 AM Per medical team, PT is to work with patient again but patient continues to deny discharge to SNF and may need home oxygen.   Expected Discharge Plan: Home w Home Health Services Barriers to Discharge: Continued Medical Work up  Expected Discharge Plan and Services In-house Referral: Clinical Social Work   Post Acute Care Choice: Home Health Living arrangements for the past 2 months: Single Family Home                                       Social Determinants of Health (SDOH) Interventions SDOH Screenings   Food Insecurity: No Food Insecurity (01/08/2024)  Housing: Low Risk  (01/08/2024)  Transportation Needs: No Transportation Needs (01/08/2024)  Utilities: Not At Risk (01/08/2024)  Alcohol Screen: Low Risk  (12/07/2022)  Depression (PHQ2-9): Low Risk  (11/23/2023)  Financial Resource Strain: Medium Risk (12/07/2022)  Physical Activity: Insufficiently Active (12/07/2022)  Social Connections: Socially Integrated (01/08/2024)  Stress: No Stress Concern Present (12/07/2022)  Tobacco Use: Medium Risk (01/08/2024)    Readmission Risk Interventions    07/14/2023    3:15 PM  Readmission Risk Prevention Plan  Transportation Screening Complete  PCP or Specialist Appt within 5-7 Days Complete  Home Care Screening Complete  Medication Review (RN CM) Complete

## 2024-01-10 NOTE — Progress Notes (Addendum)
 Mobility Specialist Progress Note:   01/10/24 1241  Mobility  Activity Ambulated with assistance in hallway  Level of Assistance  (minG)  Assistive Device Front wheel walker  Distance Ambulated (ft) 80 ft  Activity Response Tolerated well  Mobility Referral Yes  Mobility visit 1 Mobility  Mobility Specialist Start Time (ACUTE ONLY) 1220  Mobility Specialist Stop Time (ACUTE ONLY) 1230  Mobility Specialist Time Calculation (min) (ACUTE ONLY) 10 min   Pre Mobility: 73 HR , 85% SpO2 RA During Mobility: 83 HR ,  79%-93% SpO2 RA - 3 L  Post Mobility: 93% SpO2 4 L  Pt received in bed, agreeable to mobility. Desat to 79% on RA during ambulation. Pursed lip breathing encouraged. Pt needing up to 3 L to keep sats above 89%. Pt c/o slight SOB during session, otherwise asx throughout. Pt left in chair on 4 L per RN request with call bell in reach and all needs met.   Leory Plowman  Mobility Specialist Please contact via [employer] office at [phone]

## 2024-01-10 NOTE — Progress Notes (Signed)
 HD#1 SUBJECTIVE:  Patient Summary: [name] [name] is a 68 YO F with a history of COPD and anxiety presenting with a 4-day history of nausea, SOB, vomiting, and diarrhea who was admitted for acute hypoxic respiratory failure.   Overnight Events: No acute events overnight  Interim History: Patient was evaluated at bedside. Overall feels like she is improving. Notes some pain near her cervical lymph nodes. Counseled patient on contacting SNF for extension of husband's stay until patient has recovered and is safe to go home.   OBJECTIVE:  Vital Signs: Vitals:   01/09/24 1640 01/09/24 2049 01/10/24 0443 01/10/24 0731  BP: (!) 96/48 (!) 97/47 (!) 102/44 (!) 96/51  Pulse: 66 66 65 64  Resp: 18 18 18 18   Temp: 98.3 F (36.8 C) 98.9 F (37.2 C) 98.7 F (37.1 C) 98.7 F (37.1 C)  TempSrc:  Oral Oral   SpO2: 90% 91% (P) 90% 90%  Weight:      Height:       Supplemental O2: Nasal Cannula SpO2: 90 % O2 Flow Rate (L/min): 2 L/min  Filed Weights   01/08/24 1224  Weight: 92.1 kg    No intake or output data in the 24 hours ending 01/10/24 1042  Net IO Since Admission: 1,000 mL [01/10/24 1042]  Physical Exam: Physical Exam Constitutional:      Appearance: She is well-developed.  HENT:     Head: Normocephalic and atraumatic.     Mouth/Throat:     Pharynx: No oropharyngeal exudate.  Neck:     Comments: Left cervical Cardiovascular:     Rate and Rhythm: Normal rate and regular rhythm.  Pulmonary:     Effort: Pulmonary effort is normal.     Breath sounds: Wheezing present.  Abdominal:     General: Bowel sounds are normal.     Palpations: Abdomen is soft.  Lymphadenopathy:     Cervical: Cervical adenopathy present.  Skin:    General: Skin is warm.  Neurological:     General: No focal deficit present.     Mental Status: She is alert.  Psychiatric:        Mood and Affect: Mood normal.        Behavior: Behavior normal.    ASSESSMENT/PLAN:  Assessment: Principal  Problem:   Acute hypoxic respiratory failure (HCC)  Plan: #Acute hypoxic respiratory failure secondary to influenza A Continues to require an elevated O2 requirement.  Seen by PT yesterday who recommends SNF.  Patient reluctant for SNF as her husband will be coming home from his SNF on Monday.  Discussed recommendation for rehab given caretaker role patient will have at home.  Encouraged patient to reach out to husband SNF to prolong his stay.  Given her continued oxygen requirement, will continue to monitor signs or symptoms of improvement and work with safe disposition.  If her breathing and/or wheezing worsens, may consider adding a LAMA. - Continue DuoNebs every 4 hours as needed - Continue Tamiflu 75 mg twice daily (day 3/5)  #Left Cervical Lymph Node Swelling Patient noted knot on left throat making it difficult to swallow.  No exudate noted in her oropharyngeal mucosa on physical examination. Swelling noted but no to trace warmth.  Etiology likely lymphadenopathy secondary to flu versus abscess.  Will further elucidate with ultrasound. - Follow-up left cervical lymph node ultrasound  Chronic Conditions: #Anxiety: Continue home Zoloft and Xanax. #Insomnia:Continue home trazodone  Best Practice: Diet: Regular diet IVF: Fluids: None, Rate: None VTE: rivaroxaban (XARELTO) tablet 10  mg Start: 01/09/24 1000 Code: Full AB: None Therapy Recs: Home Health, DME: none Family Contact: Son, to be notified. DISPO: Anticipated discharge tomorrow to Home pending New oxygen.  Signature: Morrie Sheldon, MD Internal Medicine Resident, PGY-1 [facility]  Pager: [phone]  Please contact the on call pager after 5 pm and on weekends at [phone].

## 2024-01-11 ENCOUNTER — Other Ambulatory Visit (HOSPITAL_COMMUNITY): Payer: Self-pay

## 2024-01-11 ENCOUNTER — Telehealth (HOSPITAL_COMMUNITY): Payer: Self-pay

## 2024-01-11 ENCOUNTER — Other Ambulatory Visit: Payer: Self-pay | Admitting: Student

## 2024-01-11 DIAGNOSIS — N3946 Mixed incontinence: Secondary | ICD-10-CM

## 2024-01-11 DIAGNOSIS — D696 Thrombocytopenia, unspecified: Secondary | ICD-10-CM

## 2024-01-11 DIAGNOSIS — J9601 Acute respiratory failure with hypoxia: Secondary | ICD-10-CM

## 2024-01-11 DIAGNOSIS — J101 Influenza due to other identified influenza virus with other respiratory manifestations: Secondary | ICD-10-CM

## 2024-01-11 LAB — BASIC METABOLIC PANEL
Anion gap: 10 (ref 5–15)
BUN: 7 mg/dL — ABNORMAL LOW (ref 8–23)
CO2: 30 mmol/L (ref 22–32)
Calcium: 9.3 mg/dL (ref 8.9–10.3)
Chloride: 102 mmol/L (ref 98–111)
Creatinine, Ser: 0.67 mg/dL (ref 0.44–1.00)
GFR, Estimated: >60 mL/min (ref >60)
Glucose, Bld: 99 mg/dL (ref 70–99)
Potassium: 3.8 mmol/L (ref 3.5–5.1)
Sodium: 142 mmol/L (ref 135–145)

## 2024-01-11 LAB — CBC
HCT: 44.6 % (ref 36.0–46.0)
Hemoglobin: 15.4 g/dL — ABNORMAL HIGH (ref 12.0–15.0)
MCH: 30.9 pg (ref 26.0–34.0)
MCHC: 34.5 g/dL (ref 30.0–36.0)
MCV: 89.6 fL (ref 80.0–100.0)
Platelets: 95 10*3/uL — ABNORMAL LOW (ref 150–400)
RBC: 4.98 MIL/uL (ref 3.87–5.11)
RDW: 12.9 % (ref 11.5–15.5)
WBC: 4.6 10*3/uL (ref 4.0–10.5)
nRBC: 0 % (ref 0.0–0.2)

## 2024-01-11 MED ORDER — IPRATROPIUM-ALBUTEROL 0.5-2.5 (3) MG/3ML IN SOLN
3.0000 mL | Freq: Three times a day (TID) | RESPIRATORY_TRACT | Status: DC
  Administered 2024-01-11 (×2): 3 mL via RESPIRATORY_TRACT
  Filled 2024-01-11 (×2): qty 3

## 2024-01-11 MED ORDER — OSELTAMIVIR PHOSPHATE 75 MG PO CAPS
75.0000 mg | ORAL_CAPSULE | Freq: Two times a day (BID) | ORAL | 0 refills | Status: DC
  Filled 2024-01-11: qty 3, 2d supply, fill #0

## 2024-01-11 MED ORDER — ALBUTEROL SULFATE HFA 108 (90 BASE) MCG/ACT IN AERS
2.0000 | INHALATION_SPRAY | Freq: Four times a day (QID) | RESPIRATORY_TRACT | 0 refills | Status: AC | PRN
  Filled 2024-01-11: qty 18, 25d supply, fill #0

## 2024-01-11 NOTE — Progress Notes (Signed)
 Physical Therapy Treatment Patient Details Name: [name] [name] MRN: [id number] DOB: 09-13-1956 Today's Date: 01/11/2024   History of Present Illness [name] [name] is a 68 YO F admitted 01/08/24 with 4-day history of nausea, SOB, vomiting, and diarrhea who was dx with acute hypoxic respiratory failure and positive for influenza A. PMH includes Anxiety, Asthma, COPD, Depression, Obesity, On home oxygen therapy, and Sleep apnea.    PT Comments  Pt activity tolerance has improved since last visit to the point that she is ambulating household distances with RW with supervision. Continues to fatigue very quickly with mobility, discussed activity modifications for return home and pt would benefit from [facility] for assist with bathing. SPO2 dropped to 89% on 2L O2 with ambulation, remained in 90's on 3L O2. Sats maintained in 90's on 2L at rest. Recommend HHPT at d/c.      If plan is discharge home, recommend the following: A little help with walking and/or transfers;A little help with bathing/dressing/bathroom;Assistance with cooking/housework;Assist for transportation   Can travel by private vehicle     Yes  Equipment Recommendations  None recommended by PT    Recommendations for Other Services       Precautions / Restrictions Precautions Precautions: Fall Recall of Precautions/Restrictions: Intact Precaution/Restrictions Comments: droplet Restrictions Weight Bearing Restrictions Per Provider Order: No     Mobility  Bed Mobility Overal bed mobility: Modified Independent                  Transfers Overall transfer level: Needs assistance Equipment used: Rolling walker (2 wheels) Transfers: Sit to/from Stand Sit to Stand: Modified independent (Device/Increase time)           General transfer comment: pt up and around in room with RW    Ambulation/Gait Ambulation/Gait assistance: Supervision Gait Distance (Feet): 80 Feet Assistive device: Rolling walker (2  wheels) Gait Pattern/deviations: Step-through pattern, Decreased stride length Gait velocity: decreased Gait velocity interpretation: <1.8 ft/sec, indicate of risk for recurrent falls   General Gait Details: pt agreeable to use of RW. Ambulated 40', took standing rest break, ambulated another 40'. SPO2 dropped to 89% on 2L O2, remained in 90's on 3L O2.   Stairs             Wheelchair Mobility     Tilt Bed    Modified Rankin (Stroke Patients Only)       Balance Overall balance assessment: Needs assistance Sitting-balance support: No upper extremity supported, Feet supported Sitting balance-Leahy Scale: Good     Standing balance support: During functional activity, No upper extremity supported Standing balance-Leahy Scale: Fair                              Hotel manager: No apparent difficulties  Cognition Arousal: Alert Behavior During Therapy: WFL for tasks assessed/performed   PT - Cognitive impairments: No apparent impairments                         Following commands: Intact      Cueing Cueing Techniques: Verbal cues  Exercises      General Comments General comments (skin integrity, edema, etc.): SPO2 95% on 2L at rest. Discussed acitvity modifications as pt fatigues very quickly with all activity. Would benefit from [facility]      Pertinent Vitals/Pain Pain Assessment Pain Assessment: No/denies pain    Home Living  Prior Function            PT Goals (current goals can now be found in the care plan section) Acute Rehab PT Goals Patient Stated Goal: be safe, not fall PT Goal Formulation: With patient Time For Goal Achievement: 01/23/24 Potential to Achieve Goals: Good Progress towards PT goals: Progressing toward goals    Frequency    Min 2X/week      PT Plan      Co-evaluation              AM-PAC PT "6 Clicks" Mobility   Outcome Measure   Help needed turning from your back to your side while in a flat bed without using bedrails?: None Help needed moving from lying on your back to sitting on the side of a flat bed without using bedrails?: None Help needed moving to and from a bed to a chair (including a wheelchair)?: A Little Help needed standing up from a chair using your arms (e.g., wheelchair or bedside chair)?: None Help needed to walk in hospital room?: A Little Help needed climbing 3-5 steps with a railing? : A Lot 6 Click Score: 20    End of Session Equipment Utilized During Treatment: Oxygen;Gait belt Activity Tolerance: Patient limited by fatigue Patient left: with call bell/phone within reach;in chair Nurse Communication: Mobility status PT Visit Diagnosis: History of falling (Z91.81);Difficulty in walking, not elsewhere classified (R26.2);Muscle weakness (generalized) (M62.81);Unsteadiness on feet (R26.81)     Time: 2841-3244 PT Time Calculation (min) (ACUTE ONLY): 27 min  Charges:    $Gait Training: 23-37 mins PT General Charges $$ ACUTE PT VISIT: 1 Visit                     Lyanne Co, PT  Acute Rehab Services Secure chat preferred Office [phone]    Lawana Chambers Coben Godshall 01/11/2024, 1:41 PM

## 2024-01-11 NOTE — Progress Notes (Signed)
 Occupational Therapy Treatment Patient Details Name: [name] [name] MRN: [id number] DOB: September 26, 1956 Today's Date: 01/11/2024   History of present illness [name] [name] is a 68 YO F admitted 01/08/24 with 4-day history of nausea, SOB, vomiting, and diarrhea who was dx with acute hypoxic respiratory failure and positive for influenza A. PMH includes Anxiety, Asthma, COPD, Depression, Obesity, On home oxygen therapy, and Sleep apnea.   OT comments  Pt is making great progress towards their acute OT goals. Overall pt was able to complete transfers and functional mobility with RW and generalized superivsion A, no overt LOB or safety concerns noted. Pt did require cues for self-monitoring SOB and when to take rest breaks and complete PLB. Pt was educated on how to manage [place] with ADLs (specifically bathing and dressing), and she provided great verbal recall. Pt was on 4L O2 at the start of the session, she maintained SpO2 >90% on 2L throughout, pt left on 2L, RN made aware. OT to continue to follow acutely to facilitate progress towards established goals. Pt currently declining previous rehab rec, she now demonstrates appropriateness for discharge home with HHOT.       If plan is discharge home, recommend the following:  A little help with walking and/or transfers;A little help with bathing/dressing/bathroom;Assistance with cooking/housework;Assist for transportation   Equipment Recommendations  None recommended by OT       Precautions / Restrictions Precautions Precautions: Fall Recall of Precautions/Restrictions: Intact Precaution/Restrictions Comments: droplet Restrictions Weight Bearing Restrictions Per Provider Order: No       Mobility Bed Mobility Overal bed mobility: Modified Independent                  Transfers Overall transfer level: Needs assistance Equipment used: Rolling walker (2 wheels) Transfers: Sit to/from Stand Sit to Stand: Supervision            General transfer comment: no physcial assist needed, no overt LOB or safety concerns     Balance Overall balance assessment: Needs assistance Sitting-balance support: No upper extremity supported, Feet supported Sitting balance-Leahy Scale: Good     Standing balance support: Single extremity supported, Bilateral upper extremity supported, During functional activity, Reliant on assistive device for balance Standing balance-Leahy Scale: Poor                             ADL either performed or assessed with clinical judgement   ADL Overall ADL's : Needs assistance/impaired     Grooming: Supervision/safety;Standing                   Toilet Transfer: Supervision/safety;Ambulation;Rolling walker (2 wheels) Toilet Transfer Details (indicate cue type and reason): simulated         Functional mobility during ADLs: Supervision/safety;Rolling walker (2 wheels) General ADL Comments: pt continues to be limited by activity tolerance and SOB, she needed cues for PLB, self monitoring symptoms and when to take rest breaks. Maintained 2L throughout    Extremity/Trunk Assessment Upper Extremity Assessment Upper Extremity Assessment: Generalized weakness   Lower Extremity Assessment Lower Extremity Assessment: Generalized weakness        Vision   Vision Assessment?: No apparent visual deficits   Perception Perception Perception: Within Functional Limits   Praxis Praxis Praxis: WFL   Communication Communication Communication: No apparent difficulties   Cognition Arousal: Alert Behavior During Therapy: WFL for tasks assessed/performed Cognition: No apparent impairments  OT - Cognition Comments: suspect low health literacy - able to recall education about how to manage [place] during ADLs                 Following commands: Intact        p         General Comments SpO2 >90% on 2L, pt left on 2L, RN made aware.    Pertinent Vitals/  Pain       Pain Assessment Pain Assessment: No/denies pain   Frequency  Min 1X/week        Progress Toward Goals  OT Goals(current goals can now be found in the care plan section)  Progress towards OT goals: Progressing toward goals  Acute Rehab OT Goals Patient Stated Goal: to go home OT Goal Formulation: With patient Time For Goal Achievement: 01/23/24 Potential to Achieve Goals: Good ADL Goals Pt Will Perform Grooming: with modified independence;standing Pt Will Perform Upper Body Dressing: with modified independence;sitting Pt Will Perform Lower Body Dressing: with modified independence;sit to/from stand Pt Will Transfer to Toilet: with modified independence Pt Will Perform Toileting - Clothing Manipulation and hygiene: with modified independence;sit to/from stand Additional ADL Goal #1: Pt will verbalize at least 3 strategies for energy conservation during ADL routine with no cues  Plan         AM-PAC OT "6 Clicks" Daily Activity     Outcome Measure   Help from another person eating meals?: None Help from another person taking care of personal grooming?: A Little Help from another person toileting, which includes using toliet, bedpan, or urinal?: A Little Help from another person bathing (including washing, rinsing, drying)?: A Little Help from another person to put on and taking off regular upper body clothing?: A Little Help from another person to put on and taking off regular lower body clothing?: A Little 6 Click Score: 19    End of Session Equipment Utilized During Treatment: Rolling walker (2 wheels);Oxygen  OT Visit Diagnosis: Unsteadiness on feet (R26.81);Muscle weakness (generalized) (M62.81)   Activity Tolerance Patient tolerated treatment well   Patient Left in chair;with call bell/phone within reach   Nurse Communication Mobility status        Time: 4098-1191 OT Time Calculation (min): 17 min  Charges: OT General Charges $OT Visit: 1  Visit OT Treatments $Self Care/Home Management : 8-22 mins  Derenda Mis, OTR/L Acute Rehabilitation Services Office [phone] Secure Chat Communication Preferred   Donia Pounds 01/11/2024, 10:18 AM

## 2024-01-11 NOTE — Plan of Care (Signed)

## 2024-01-11 NOTE — TOC Transition Note (Signed)
 Transition of Care [facility]) - Discharge Note   Patient Details  Name: [name] [name] MRN: [id number] Date of Birth: October 16, 1956  [facility] CM/SW Contact:  Harriet Masson, RN Phone Number: 01/11/2024, 12:26 PM   Clinical Narrative:    Patient stable for discharge.  Home 02 and home health orders. This RNCM offered choice for Home  Health, Patient states she has no preference, RNCM made referral to [facility] with Frances Furbish, He is able to take referral.  Notified [employer] , Zack, of home 02 order.  Family will transport home.  Address, Phone number and PCP verified.    Final next level of care: Home w Home Health Services Barriers to Discharge: Barriers Resolved   Patient Goals and CMS Choice Patient states their goals for this hospitalization and ongoing recovery are:: to go home CMS Medicare.gov Compare Post Acute Care list provided to:: Patient Choice offered to / list presented to : Patient      Discharge Placement             Home          Discharge Plan and Services Additional resources added to the After Visit Summary for   In-house Referral: Clinical Social Work   Post Acute Care Choice: Home Health          DME Arranged: Oxygen DME Agency: [employer] Date DME Agency Contacted: 01/11/24 Time DME Agency Contacted: 1226 Representative spoke with at DME Agency: Zack HH Arranged: RN, PT, OT, Nurse's Aide, Social Work [employer] Agency: [facility] Date [facility] Agency Contacted: 01/11/24 Time HH Agency Contacted: 1223 Representative spoke with at [facility] Agency: Kandee Keen  Social Drivers of Health (SDOH) Interventions SDOH Screenings   Food Insecurity: No Food Insecurity (01/08/2024)  Housing: Low Risk  (01/08/2024)  Transportation Needs: No Transportation Needs (01/08/2024)  Utilities: Not At Risk (01/08/2024)  Alcohol Screen: Low Risk  (12/07/2022)  Depression (PHQ2-9): Low Risk  (11/23/2023)  Financial Resource Strain: Medium Risk (12/07/2022)  Physical  Activity: Insufficiently Active (12/07/2022)  Social Connections: Socially Integrated (01/08/2024)  Stress: No Stress Concern Present (12/07/2022)  Tobacco Use: Medium Risk (01/08/2024)     Readmission Risk Interventions    07/14/2023    3:15 PM  Readmission Risk Prevention Plan  Transportation Screening Complete  PCP or Specialist Appt within 5-7 Days Complete  Home Care Screening Complete  Medication Review (RN CM) Complete

## 2024-01-11 NOTE — Telephone Encounter (Signed)
 Pharmacy Patient Advocate Encounter  Insurance verification completed.    The patient is insured through [employer].     Ran test claim for Anoro and it is not formulary. Pt plan prefers Stiloto Respimat.   This test claim was processed through [facility]- copay amounts may vary at other pharmacies due to pharmacy/plan contracts, or as the patient moves through the different stages of their insurance plan.

## 2024-01-11 NOTE — Progress Notes (Signed)
 SATURATION QUALIFICATIONS: (This note is used to comply with regulatory documentation for home oxygen)  Patient Saturations on Room Air at Rest = 87%  Patient Saturations on Room Air while Ambulating = 81%  Patient Saturations on 3 Liters of oxygen while Ambulating = 95%  Please briefly explain why patient needs home oxygen:  Pt would benefit from the use of home oxygen as pt is not able to maintain appropriate oxygen sats while at rest or ambulating. Pt continues to display SOB and fatigue by the end of mobility.

## 2024-01-11 NOTE — Telephone Encounter (Signed)
 Pharmacy Patient Advocate Encounter  Insurance verification completed.    The patient is insured through  [employer].     Ran test claim for Stiolto Respimat and the current 30 day co-pay is $12.15.   This test claim was processed through [facility]- copay amounts may vary at other pharmacies due to pharmacy/plan contracts, or as the patient moves through the different stages of their insurance plan.

## 2024-01-11 NOTE — Progress Notes (Signed)
 Mobility Specialist Progress Note:   01/11/24 1009  Mobility  Activity Ambulated with assistance in hallway  Level of Assistance  (MinG)  Assistive Device Front wheel walker  Distance Ambulated (ft) 110 ft  Activity Response Tolerated well  Mobility Referral Yes  Mobility visit 1 Mobility  Mobility Specialist Start Time (ACUTE ONLY) 0920  Mobility Specialist Stop Time (ACUTE ONLY) 0930  Mobility Specialist Time Calculation (min) (ACUTE ONLY) 10 min   During Mobility: 84 HR , 94% SpO2 3 L  Pt received EOB, agreeable to mobility. VSS on 3 L. Pt c/o slight fatigue towards EOS requiring standing rest break, otherwise asx throughout. Pt returned EOB with call bell in reach and all needs met.   Leory Plowman  Mobility Specialist Please contact via [employer] office at [phone]

## 2024-01-11 NOTE — Progress Notes (Signed)
 Pt transported to Discharge lounge to wait for son to transportation home.

## 2024-01-11 NOTE — Discharge Summary (Addendum)
 Name: [name] [name] MRN: [id number] DOB: [birth year] 68 y.o. PCP: Rudene Christians, DO  Date of Admission: 01/08/2024 12:04 PM Date of Discharge:  01/11/24 Attending Physician: Dr. Mayford Knife  DISCHARGE DIAGNOSIS:  Primary Problem: Acute hypoxic respiratory failure [facility])   Hospital Problems: Principal Problem:   Acute hypoxic respiratory failure (HCC) Active Problems:   Influenza due to identified novel influenza A virus with other respiratory manifestations   Thrombocytopenia (HCC)   Discomfort of submandibular region    DISCHARGE MEDICATIONS:   Allergies as of 01/11/2024       Reactions   Ibuprofen Anaphylaxis, Hives, Swelling   Throat swells   Clonazepam Other (See Comments)   Dizziness        Medication List     TAKE these medications    acetaminophen 500 MG tablet Commonly known as: TYLENOL Take 1,000 mg by mouth every 6 (six) hours as needed (for pain OR back pain).   albuterol 108 (90 Base) MCG/ACT inhaler Commonly known as: VENTOLIN HFA Inhale 2 puffs into the lungs every 6 (six) hours as needed for wheezing or shortness of breath.   ALPRAZolam 1 MG tablet Commonly known as: Xanax Take 1 tablet (1 mg total) by mouth 2 (two) times daily as needed for anxiety.   diclofenac Sodium 1 % Gel Commonly known as: Voltaren Apply 2 g topically 4 (four) times daily. What changed:  when to take this reasons to take this   gabapentin 400 MG capsule Commonly known as: NEURONTIN TAKE 1 CAPSULE(400 MG) BY MOUTH TWICE DAILY   mirabegron ER 25 MG Tb24 tablet Commonly known as: Myrbetriq Take 1 tablet (25 mg total) by mouth daily.   oseltamivir 75 MG capsule Commonly known as: TAMIFLU Take 1 capsule (75 mg total) by mouth 2 (two) times daily.   Ozempic (1 MG/DOSE) 4 MG/3ML Sopn Generic drug: Semaglutide (1 MG/DOSE) INJECT 1 MG UNDER THE SKIN ONE DAY A WEEK   pantoprazole 40 MG tablet Commonly known as: PROTONIX TAKE 1 TABLET(40 MG) BY MOUTH DAILY    sertraline 100 MG tablet Commonly known as: ZOLOFT TAKE 1 TABLET BY MOUTH EVERY DAY   traZODone 150 MG tablet Commonly known as: DESYREL Take 1 tablet (150 mg total) by mouth at bedtime.               Durable Medical Equipment  (From admission, onward)           Start     Ordered   01/11/24 1222  For home use only DME oxygen  Once       Question Answer Comment  Length of Need 6 Months   Mode or (Route) Nasal cannula   Liters per Minute 3   Oxygen delivery system Gas      01/11/24 1221            DISPOSITION AND FOLLOW-UP:  KelseyLilyauna Maness [name] was discharged from [facility] in Stable condition. At the hospital follow up visit please address:  Follow-up Recommendations: Consults: None Labs:  None Studies: PFTs Medications: Albuterol, may consider LAMA   Follow-up Appointments:  Follow-up Information     [facility] Follow up.   Why: Call to make apt for the portable oxygen  consentrator test if desired. Contact information: [address] [place] [address] [phone]         [facility] Follow up.   Specialty: Home Health Services Why: Home health has been arranged. They will contact  you to schedule apt within 48 hrs post discharge. Contact information: [address] [place] [address] [phone]         [name], [name], DO. Schedule an appointment as soon as possible for a visit in 2 week(s).   Specialty: Internal Medicine Why: Hospital follow up Contact information: [address] [place] [address] [phone]                 HOSPITAL COURSE:  Patient Summary: #Acute hypoxic respiratory failure #Positive for influenza A #Respiratory Alkalosis, Compensated Presented with 7-day history of cough and 4-day history of diarrhea, shortness of breath, nausea, and malaise.  Etiology was likely from positive influenza A.  She had a former hx of  nocturnal hypoxia for which she was prescribed home 02 at night, but she hadn't needed it in quite some time.  She was started on Tamiflu for 5 days though acknowledging that window of opportunity was probably past.  Fatigue has gradually improved as have respiratory symptoms though she continued to require supplemental 02.  She was discharged with supplemental oxygen and HH PT and OT. She was continued on PRN albuterol. Given patient's significant smoking history, would recommend follow-up with PFTs outpatient. - Continue Tamiflu for 3 more doses  - Follow-up outpatient PFTs (have not been ordered) - Continue prn albuterol for now  #Left Cervical Lymph Node Swelling Left submandibular nodule noted that was tender and uncomfortable with tongue movement. US demonstrated normal physiologic lymph nodes and doesn't correlate with her palpable tender  area.  Please recheck at clinic f/u.  #Anxiety: Resumed home Zoloft and Xanax (there is ongoing effort in outpatient setting to gradually reduce her benzodiazepine).  #Insomnia: Resumed home trazodone   DISCHARGE INSTRUCTIONS:   Discharge Instructions     Call MD for:  difficulty breathing, headache or visual disturbances   Complete by: As directed    Call MD for:  extreme fatigue   Complete by: As directed    Call MD for:  hives   Complete by: As directed    Call MD for:  persistant dizziness or light-headedness   Complete by: As directed    Call MD for:  persistant nausea and vomiting   Complete by: As directed    Call MD for:  redness, tenderness, or signs of infection (pain, swelling, redness, odor or green/yellow discharge around incision site)   Complete by: As directed    Call MD for:  severe uncontrolled pain   Complete by: As directed    Call MD for:  temperature >100.4   Complete by: As directed    Diet - low sodium heart healthy   Complete by: As directed    Discharge instructions   Complete by: As directed    Ms.  [name],  You were hospitalized for shortness of breath that was caused by the flu. We have given you breathing treatments and supplemental oxygen, and at this time, we feel that you are safe to go home.  Please *CONTINUE* taking the following medications:  - Tamiflu 1 capsule daily (one dose today and two tomorrow)  - Albuterol 2 puffs PRN for shortness of breath  Please continue taking your other medications as prescribed. I have also sent albuterol with you to help with your breathing. You will be receiving home health physical therapy and occupational therapy as well.   I have reached out to [facility] for them to call you and schedule a follow-up visit. You should be hearing  from them soon. If you have any questions, please call us at [phone]. We are glad that you are feeling better!   Increase activity slowly   Complete by: As directed        SUBJECTIVE:  Patient was evaluated at bedside. Notes improvement in fatigue. Declining SNF but is amendable to [facility] PT and HH OT. Continues to endorse SOB over overall improving.  Discharge Vitals:   BP 108/62 (BP Location: Right Arm)   Pulse 60   Temp 98.3 F (36.8 C)   Resp 18   Ht 5\' 8"  (1.727 m)   Wt 92.1 kg   SpO2 91%   BMI 30.87 kg/m   OBJECTIVE:  Physical Exam Constitutional:      Appearance: She is well-developed.  HENT:     Head: Normocephalic and atraumatic.  Cardiovascular:     Rate and Rhythm: Normal rate and regular rhythm.  Pulmonary:     Effort: Pulmonary effort is normal.     Breath sounds: Wheezing present.  Skin:    General: Skin is warm.     Capillary Refill: Capillary refill takes less than 2 seconds.  Neurological:     General: No focal deficit present.     Mental Status: She is alert.  Psychiatric:        Mood and Affect: Mood normal.        Behavior: Behavior normal.     Pertinent Labs, Studies, and Procedures:     Latest Ref Rng & Units 01/11/2024    5:07 AM 01/09/2024    7:13 AM 01/08/2024    1:53  PM  CBC  WBC 4.0 - 10.5 K/uL 4.6  3.5    Hemoglobin 12.0 - 15.0 g/dL 56.2  13.0  86.5   Hematocrit 36.0 - 46.0 % 44.6  45.3  49.0   Platelets 150 - 400 K/uL 95  88        Latest Ref Rng & Units 01/11/2024    5:07 AM 01/09/2024    7:13 AM 01/08/2024    6:34 PM  CMP  Glucose 70 - 99 mg/dL 99  784  696   BUN 8 - 23 mg/dL 7  6  6    Creatinine 0.44 - 1.00 mg/dL 2.95  2.84  1.32   Sodium 135 - 145 mmol/L 142  138  138   Potassium 3.5 - 5.1 mmol/L 3.8  3.8  3.3   Chloride 98 - 111 mmol/L 102  99  100   CO2 22 - 32 mmol/L 30  31  27    Calcium 8.9 - 10.3 mg/dL 9.3  8.7  8.9    DG Chest Port 1 View Result Date: 01/08/2024 CLINICAL DATA:  Shortness of breath and chest pain EXAM: PORTABLE CHEST 1 VIEW COMPARISON:  Chest radiograph dated 07/13/2023 FINDINGS: Normal lung volumes. No focal consolidations. No pleural effusion or pneumothorax. The heart size and mediastinal contours are within normal limits. No acute osseous abnormality. IMPRESSION: No acute disease. Electronically Signed   By: Agustin Cree M.D.   On: 01/08/2024 16:17    Signed: Morrie Sheldon, MD Internal Medicine Resident, PGY-1 [facility]  Pager: [phone]

## 2024-01-11 NOTE — Progress Notes (Signed)
  Pasty Arch, RN  Registered Nurse   Progress Notes     Signed   Date of Service: 01/11/2024 11:38 AM   Signed      SATURATION QUALIFICATIONS: (This note is used to comply with regulatory documentation for home oxygen)   Patient Saturations on Room Air at Rest = 87%   Patient Saturations on Room Air while Ambulating = 81%   Patient Saturations on 3 Liters of oxygen while Ambulating = 95%   Please briefly explain why patient needs home oxygen:   Pt would benefit from the use of home oxygen as pt is not able to maintain appropriate oxygen sats while at rest or ambulating. Pt continues to display SOB and fatigue by the end of mob        \  Durable Medical Equipment (From admission, onward)        Start     Ordered  01/11/24 1129  For home use only DME oxygen  Once      Question Answer Comment Length of Need 6 Months  Mode or (Route) Nasal cannula  Liters per Minute 4  Oxygen delivery system Gas    01/11/24 1128

## 2024-01-15 ENCOUNTER — Telehealth: Payer: Self-pay | Admitting: *Deleted

## 2024-01-15 NOTE — Telephone Encounter (Signed)
 RTC to [facility] .  Message left to call back with reasons for referral and will forward other concerns to patient 's PCP.

## 2024-01-23 ENCOUNTER — Ambulatory Visit: Admitting: Internal Medicine

## 2024-01-23 VITALS — BP 108/77 | HR 103 | Temp 97.6°F | Ht 68.0 in | Wt 194.4 lb

## 2024-01-23 DIAGNOSIS — F419 Anxiety disorder, unspecified: Secondary | ICD-10-CM | POA: Diagnosis not present

## 2024-01-23 DIAGNOSIS — F411 Generalized anxiety disorder: Secondary | ICD-10-CM

## 2024-01-23 DIAGNOSIS — J449 Chronic obstructive pulmonary disease, unspecified: Secondary | ICD-10-CM

## 2024-01-23 NOTE — Patient Instructions (Signed)
 Thank you, Ms.Anina Schnake Chervenak for allowing [place] to provide your care today.   Smoking and concern for COPD I have ordered a lung function test for you to evaluate if you have COPD or chronic lung changes.   Your blood pressure does drop when you changes positions. I am worried this is related to some of your medications include in xanax, gabapentin and trazodone. Please talk with your psychiatry team about the risks and benefits of these medications.  I have ordered the following medication/changed the following medications:   Stop the following medications: Medications Discontinued During This Encounter  Medication Reason   oseltamivir (TAMIFLU) 75 MG capsule      Start the following medications: No orders of the defined types were placed in this encounter.    Follow up: 3 months   We look forward to seeing you next time. Please call our clinic at [phone] if you have any questions or concerns. The best time to call is Monday-Friday from 9am-4pm, but there is someone available 24/7. If after hours or the weekend, call the main hospital number and ask for the Internal Medicine Resident On-Call. If you need medication refills, please notify your pharmacy one week in advance and they will send [place] a request.   Thank you for trusting me with your care. Wishing you the best!   Rudene Christians, DO [facility]
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
## 2024-01-24 ENCOUNTER — Encounter: Payer: Self-pay | Admitting: Internal Medicine

## 2024-01-24 NOTE — Assessment & Plan Note (Signed)
 She reports some dizziness with standing.  Orthostatics completed and she dropped about 18 with systolic from laying to standing. I encouraged her to increase fluid intake. She is also on several medications including xanax, trazodone, gabapentin that could increase risk of orthostatics. I asked her to continue conversations with her psychiatry team to weight risks and benefits of these medications.

## 2024-01-24 NOTE — Assessment & Plan Note (Signed)
 She has recovered well after likely COPD exacerbation from influenza.  Ambulatory saturations done in clinic today and she stayed greater than 92% oxygen with walking on room air.  Now that she has recovered from flu, I talked with her about need to complete lung function test to assess for COPD as this would guide which inhaler she is on. P: Pulmonary function test ordered

## 2024-01-28 ENCOUNTER — Emergency Department (HOSPITAL_COMMUNITY)

## 2024-01-28 ENCOUNTER — Inpatient Hospital Stay (HOSPITAL_COMMUNITY)
Admission: EM | Admit: 2024-01-28 | Discharge: 2024-02-04 | DRG: 417 | Disposition: A | Discharge status: Home Health | Attending: Internal Medicine | Admitting: Internal Medicine

## 2024-01-28 ENCOUNTER — Other Ambulatory Visit: Payer: Self-pay

## 2024-01-28 ENCOUNTER — Encounter (HOSPITAL_COMMUNITY): Payer: Self-pay | Admitting: Emergency Medicine

## 2024-01-28 DIAGNOSIS — K760 Fatty (change of) liver, not elsewhere classified: Secondary | ICD-10-CM | POA: Diagnosis present

## 2024-01-28 DIAGNOSIS — G4700 Insomnia, unspecified: Secondary | ICD-10-CM | POA: Diagnosis present

## 2024-01-28 DIAGNOSIS — Z803 Family history of malignant neoplasm of breast: Secondary | ICD-10-CM

## 2024-01-28 DIAGNOSIS — Z811 Family history of alcohol abuse and dependence: Secondary | ICD-10-CM

## 2024-01-28 DIAGNOSIS — I452 Bifascicular block: Secondary | ICD-10-CM | POA: Diagnosis present

## 2024-01-28 DIAGNOSIS — G8929 Other chronic pain: Secondary | ICD-10-CM | POA: Diagnosis present

## 2024-01-28 DIAGNOSIS — Z66 Do not resuscitate: Secondary | ICD-10-CM | POA: Diagnosis present

## 2024-01-28 DIAGNOSIS — R7401 Elevation of levels of liver transaminase levels: Principal | ICD-10-CM

## 2024-01-28 DIAGNOSIS — Z6829 Body mass index (BMI) 29.0-29.9, adult: Secondary | ICD-10-CM

## 2024-01-28 DIAGNOSIS — Z555 Less than a high school diploma: Secondary | ICD-10-CM

## 2024-01-28 DIAGNOSIS — Z888 Allergy status to other drugs, medicaments and biological substances status: Secondary | ICD-10-CM

## 2024-01-28 DIAGNOSIS — Z634 Disappearance and death of family member: Secondary | ICD-10-CM

## 2024-01-28 DIAGNOSIS — D696 Thrombocytopenia, unspecified: Secondary | ICD-10-CM | POA: Diagnosis present

## 2024-01-28 DIAGNOSIS — K567 Ileus, unspecified: Secondary | ICD-10-CM

## 2024-01-28 DIAGNOSIS — Z5986 Financial insecurity: Secondary | ICD-10-CM

## 2024-01-28 DIAGNOSIS — R0789 Other chest pain: Secondary | ICD-10-CM | POA: Diagnosis present

## 2024-01-28 DIAGNOSIS — Z7985 Long-term (current) use of injectable non-insulin antidiabetic drugs: Secondary | ICD-10-CM

## 2024-01-28 DIAGNOSIS — G4733 Obstructive sleep apnea (adult) (pediatric): Secondary | ICD-10-CM | POA: Diagnosis present

## 2024-01-28 DIAGNOSIS — E876 Hypokalemia: Secondary | ICD-10-CM | POA: Diagnosis present

## 2024-01-28 DIAGNOSIS — F32A Depression, unspecified: Secondary | ICD-10-CM | POA: Diagnosis present

## 2024-01-28 DIAGNOSIS — K851 Biliary acute pancreatitis without necrosis or infection: Principal | ICD-10-CM | POA: Diagnosis present

## 2024-01-28 DIAGNOSIS — R7989 Other specified abnormal findings of blood chemistry: Secondary | ICD-10-CM | POA: Insufficient documentation

## 2024-01-28 DIAGNOSIS — K831 Obstruction of bile duct: Secondary | ICD-10-CM

## 2024-01-28 DIAGNOSIS — Z90710 Acquired absence of both cervix and uterus: Secondary | ICD-10-CM

## 2024-01-28 DIAGNOSIS — K219 Gastro-esophageal reflux disease without esophagitis: Secondary | ICD-10-CM | POA: Diagnosis present

## 2024-01-28 DIAGNOSIS — Z87891 Personal history of nicotine dependence: Secondary | ICD-10-CM

## 2024-01-28 DIAGNOSIS — Z9049 Acquired absence of other specified parts of digestive tract: Secondary | ICD-10-CM

## 2024-01-28 DIAGNOSIS — K8065 Calculus of gallbladder and bile duct with chronic cholecystitis with obstruction: Principal | ICD-10-CM | POA: Diagnosis present

## 2024-01-28 DIAGNOSIS — J449 Chronic obstructive pulmonary disease, unspecified: Secondary | ICD-10-CM | POA: Diagnosis present

## 2024-01-28 DIAGNOSIS — Z825 Family history of asthma and other chronic lower respiratory diseases: Secondary | ICD-10-CM

## 2024-01-28 DIAGNOSIS — Z79899 Other long term (current) drug therapy: Secondary | ICD-10-CM

## 2024-01-28 DIAGNOSIS — J4489 Other specified chronic obstructive pulmonary disease: Secondary | ICD-10-CM | POA: Diagnosis present

## 2024-01-28 DIAGNOSIS — Z9981 Dependence on supplemental oxygen: Secondary | ICD-10-CM

## 2024-01-28 DIAGNOSIS — E669 Obesity, unspecified: Secondary | ICD-10-CM | POA: Diagnosis present

## 2024-01-28 DIAGNOSIS — J9611 Chronic respiratory failure with hypoxia: Secondary | ICD-10-CM | POA: Diagnosis present

## 2024-01-28 DIAGNOSIS — R350 Frequency of micturition: Secondary | ICD-10-CM | POA: Diagnosis present

## 2024-01-28 DIAGNOSIS — F410 Panic disorder [episodic paroxysmal anxiety] without agoraphobia: Secondary | ICD-10-CM | POA: Diagnosis present

## 2024-01-28 DIAGNOSIS — K859 Acute pancreatitis without necrosis or infection, unspecified: Secondary | ICD-10-CM | POA: Insufficient documentation

## 2024-01-28 DIAGNOSIS — K839 Disease of biliary tract, unspecified: Secondary | ICD-10-CM

## 2024-01-28 DIAGNOSIS — K8020 Calculus of gallbladder without cholecystitis without obstruction: Secondary | ICD-10-CM | POA: Diagnosis present

## 2024-01-28 DIAGNOSIS — Z886 Allergy status to analgesic agent status: Secondary | ICD-10-CM

## 2024-01-28 LAB — CBC
HCT: 48.2 % — ABNORMAL HIGH (ref 36.0–46.0)
Hemoglobin: 16.3 g/dL — ABNORMAL HIGH (ref 12.0–15.0)
MCH: 30.8 pg (ref 26.0–34.0)
MCHC: 33.8 g/dL (ref 30.0–36.0)
MCV: 90.9 fL (ref 80.0–100.0)
Platelets: 128 10*3/uL — ABNORMAL LOW (ref 150–400)
RBC: 5.3 MIL/uL — ABNORMAL HIGH (ref 3.87–5.11)
RDW: 12.7 % (ref 11.5–15.5)
WBC: 12.3 10*3/uL — ABNORMAL HIGH (ref 4.0–10.5)
nRBC: 0 % (ref 0.0–0.2)

## 2024-01-28 LAB — BASIC METABOLIC PANEL
Anion gap: 11 (ref 5–15)
BUN: 11 mg/dL (ref 8–23)
CO2: 26 mmol/L (ref 22–32)
Calcium: 9.5 mg/dL (ref 8.9–10.3)
Chloride: 100 mmol/L (ref 98–111)
Creatinine, Ser: 0.65 mg/dL (ref 0.44–1.00)
GFR, Estimated: >60 mL/min (ref >60)
Glucose, Bld: 122 mg/dL — ABNORMAL HIGH (ref 70–99)
Potassium: 3.6 mmol/L (ref 3.5–5.1)
Sodium: 137 mmol/L (ref 135–145)

## 2024-01-28 LAB — URINALYSIS, ROUTINE W REFLEX MICROSCOPIC
Glucose, UA: 100 mg/dL — AB
Hgb urine dipstick: NEGATIVE
Ketones, ur: NEGATIVE mg/dL
Leukocytes,Ua: NEGATIVE
Nitrite: POSITIVE — AB
Protein, ur: NEGATIVE mg/dL
Specific Gravity, Urine: 1.02 (ref 1.005–1.030)
pH: 5.5 (ref 5.0–8.0)

## 2024-01-28 LAB — URINALYSIS, MICROSCOPIC (REFLEX)

## 2024-01-28 LAB — TROPONIN I (HIGH SENSITIVITY): Troponin I (High Sensitivity): 5 ng/L (ref <18)

## 2024-01-28 MED ORDER — METHYLPREDNISOLONE SODIUM SUCC 125 MG IJ SOLR
125.0000 mg | Freq: Once | INTRAMUSCULAR | Status: AC
  Administered 2024-01-28: 125 mg via INTRAVENOUS
  Filled 2024-01-28: qty 2

## 2024-01-28 MED ORDER — SODIUM CHLORIDE 0.9 % IV BOLUS
1000.0000 mL | Freq: Once | INTRAVENOUS | Status: AC
  Administered 2024-01-28: 1000 mL via INTRAVENOUS

## 2024-01-28 MED ORDER — IPRATROPIUM-ALBUTEROL 0.5-2.5 (3) MG/3ML IN SOLN
3.0000 mL | Freq: Once | RESPIRATORY_TRACT | Status: AC
  Administered 2024-01-28: 3 mL via RESPIRATORY_TRACT
  Filled 2024-01-28: qty 3

## 2024-01-28 MED ORDER — MAGNESIUM SULFATE 2 GM/50ML IV SOLN
2.0000 g | Freq: Once | INTRAVENOUS | Status: AC
  Administered 2024-01-28: 2 g via INTRAVENOUS
  Filled 2024-01-28: qty 50

## 2024-01-28 MED ORDER — ONDANSETRON HCL 4 MG/2ML IJ SOLN
4.0000 mg | Freq: Once | INTRAMUSCULAR | Status: AC
  Administered 2024-01-28: 4 mg via INTRAVENOUS
  Filled 2024-01-28: qty 2

## 2024-01-28 NOTE — ED Notes (Signed)
 Pt placed on 3L [place]. Reports she is supposed to wear 3L [place] at all times but she doesn't have home oxygen.

## 2024-01-28 NOTE — ED Notes (Signed)
 Pt states she thinks she has a UTI because her urine is very foul smelling. Requested urine specimen from patient.

## 2024-01-28 NOTE — ED Provider Notes (Signed)
 [place] [facility]PARTMENT AT [facility] Provider Note   CSN: [id number] Arrival date & time: 01/28/24  1936     History  Chief Complaint  Patient presents with   Chest Pain   Back Pain    [name] [name] is a 68 y.o. female history of COPD on 3 L baseline, OSA, anxiety, dilated aortic arch presented for 3 weeks of chest pain shortness of breath.  Patient states that the discomfort was around her chest and going to her abdomen which goes all around.  Patient does state that she try to eat today and vomited up her food but denies any abdominal pain besides that at the moment.  Patient is state that the chest pain and back pain feels separate.  Patient has been unable to use the home oxygen at home due to insurance reasons but states she does not feel short of breath.  Patient denies productive cough or fevers.  Patient was recently admitted for the flu.  Home Medications Prior to Admission medications   Medication Sig Start Date End Date Taking? Authorizing Provider  acetaminophen (TYLENOL) 500 MG tablet Take 1,000 mg by mouth every 6 (six) hours as needed (for pain OR back pain).     [provider]  albuterol (VENTOLIN HFA) 108 (90 Base) MCG/ACT inhaler Inhale 2 puffs into the lungs every 6 (six) hours as needed for wheezing or shortness of breath. 01/11/24   Morrie Sheldon, MD  ALPRAZolam Prudy Feeler) 1 MG tablet Take 1 tablet (1 mg total) by mouth 2 (two) times daily as needed for anxiety. 11/06/23   [name], PA-C  diclofenac Sodium (VOLTAREN) 1 % GEL Apply 2 g topically 4 (four) times daily. Patient taking differently: Apply 2 g topically daily as needed (for pain). 11/23/23   [name], [name], DO  gabapentin (NEURONTIN) 400 MG capsule TAKE 1 CAPSULE(400 MG) BY MOUTH TWICE DAILY 01/07/24   Hurst, Rosey Bath T, PA-C  MYRBETRIQ 25 MG TB24 tablet TAKE 1 TABLET(25 MG) BY MOUTH DAILY 01/12/24   [name], [name], DO  OZEMPIC, 1 MG/DOSE, 4 MG/3ML SOPN INJECT 1 MG UNDER THE  SKIN ONE DAY A WEEK 01/08/24   [name], [name], DO  pantoprazole (PROTONIX) 40 MG tablet TAKE 1 TABLET(40 MG) BY MOUTH DAILY 04/12/23   [name], [name], DO  sertraline (ZOLOFT) 100 MG tablet TAKE 1 TABLET BY MOUTH EVERY DAY 12/04/23   [name], PA-C  traZODone (DESYREL) 150 MG tablet Take 1 tablet (150 mg total) by mouth at bedtime. 08/11/23   Cherie Ouch, PA-C      Allergies    Ibuprofen and Clonazepam    Review of Systems   Review of Systems  Cardiovascular:  Positive for chest pain.  Musculoskeletal:  Positive for back pain.    Physical Exam Updated Vital Signs BP 111/73   Pulse 73   Temp 98.1 F (36.7 C) (Oral)   Resp 16   Ht 5\' 8"  (1.727 m)   Wt 88.9 kg   SpO2 96%   BMI 29.80 kg/m  Physical Exam Vitals reviewed.  Constitutional:      General: She is not in acute distress. HENT:     Head: Normocephalic and atraumatic.  Eyes:     Extraocular Movements: Extraocular movements intact.     Conjunctiva/sclera: Conjunctivae normal.     Pupils: Pupils are equal, round, and reactive to light.  Cardiovascular:     Rate and Rhythm: Normal rate and regular rhythm.  Pulses: Normal pulses.     Heart sounds: Normal heart sounds.     Comments: 2+ bilateral radial/dorsalis pedis pulses with regular rate Pulmonary:     Effort: No respiratory distress.     Breath sounds: Decreased breath sounds and wheezing present.     Comments: Speaking full sentences but does become winded Abdominal:     Palpations: Abdomen is soft.     Tenderness: There is no abdominal tenderness. There is no guarding or rebound.  Musculoskeletal:        General: Normal range of motion.     Cervical back: Normal range of motion and neck supple.     Comments: 5 out of 5 bilateral grip/leg extension strength  Skin:    General: Skin is warm and dry.     Capillary Refill: Capillary refill takes less than 2 seconds.  Neurological:     General: No focal deficit present.     Mental Status: She is alert  and oriented to person, place, and time.     Comments: Sensation intact in all 4 limbs  Psychiatric:        Mood and Affect: Mood normal.     ED Results / Procedures / Treatments   Labs (all labs ordered are listed, but only abnormal results are displayed) Labs Reviewed  BASIC METABOLIC PANEL - Abnormal; Notable for the following components:      Result Value   Glucose, Bld 122 (*)    All other components within normal limits  CBC - Abnormal; Notable for the following components:   WBC 12.3 (*)    RBC 5.30 (*)    Hemoglobin 16.3 (*)    HCT 48.2 (*)    Platelets 128 (*)    All other components within normal limits  HEPATIC FUNCTION PANEL - Abnormal; Notable for the following components:   Total Protein 6.0 (*)    Albumin 3.0 (*)    AST 192 (*)    ALT 190 (*)    Alkaline Phosphatase 217 (*)    Total Bilirubin 3.9 (*)    Bilirubin, Direct 2.5 (*)    Indirect Bilirubin 1.4 (*)    All other components within normal limits  LIPASE, BLOOD - Abnormal; Notable for the following components:   Lipase 798 (*)    All other components within normal limits  URINALYSIS, ROUTINE W REFLEX MICROSCOPIC - Abnormal; Notable for the following components:   Glucose, UA 100 (*)    Bilirubin Urine MODERATE (*)    Nitrite POSITIVE (*)    All other components within normal limits  URINALYSIS, MICROSCOPIC (REFLEX) - Abnormal; Notable for the following components:   Bacteria, UA MANY (*)    All other components within normal limits  RESP PANEL BY RT-PCR (RSV, FLU A&B, COVID)  RVPGX2  HEPATITIS PANEL, ACUTE  TROPONIN I (HIGH SENSITIVITY)  TROPONIN I (HIGH SENSITIVITY)    EKG None  Radiology US Abdomen Limited RUQ (LIVER/GB) Result Date: 01/29/2024 CLINICAL DATA:  Elevated LFTs EXAM: ULTRASOUND ABDOMEN LIMITED RIGHT UPPER QUADRANT COMPARISON:  None Available. FINDINGS: Gallbladder: Gallbladder is well distended with single gallstone. No wall thickening or pericholecystic fluid is noted. Common  bile duct: Diameter: 4.4 mm. Liver: Diffusely increased in echogenicity without focal mass. Portal vein is patent on color Doppler imaging with normal direction of blood flow towards the liver. Other: None. IMPRESSION: Cholelithiasis without complicating factors. Fatty liver. Electronically Signed   By: Alcide Clever M.D.   On: 01/29/2024 01:53   DG  Chest 2 View Result Date: 01/28/2024 CLINICAL DATA:  Chest pain EXAM: CHEST - 2 VIEW COMPARISON:  01/08/2024 FINDINGS: Melene Muller is within normal limits. The lungs are well aerated bilaterally. No focal infiltrate or effusion is seen. No bony abnormality is noted. IMPRESSION: No active cardiopulmonary disease. Electronically Signed   By: Alcide Clever M.D.   On: 01/28/2024 20:42    Procedures .Critical Care  Performed by: Netta Corrigan, PA-C Authorized by: Netta Corrigan, PA-C   Critical care provider statement:    Critical care time (minutes):  40   Critical care was necessary to treat or prevent imminent or life-threatening deterioration of the following conditions:  Hepatic failure   Critical care was time spent personally by me on the following activities:  Blood draw for specimens, development of treatment plan with patient or surrogate, discussions with consultants, evaluation of patient's response to treatment, examination of patient, obtaining history from patient or surrogate, review of old charts, re-evaluation of patient's condition, pulse oximetry, ordering and review of radiographic studies, ordering and review of laboratory studies and ordering and performing treatments and interventions   I assumed direction of critical care for this patient from another provider in my specialty: no     Care discussed with: admitting provider       Medications Ordered in ED Medications  morphine (PF) 4 MG/ML injection 4 mg (has no administration in time range)  ipratropium-albuterol (DUONEB) 0.5-2.5 (3) MG/3ML nebulizer solution 3 mL (3 mLs  Nebulization Given 01/28/24 2244)  methylPREDNISolone sodium succinate (SOLU-MEDROL) 125 mg/2 mL injection 125 mg (125 mg Intravenous Given 01/28/24 2244)  sodium chloride 0.9 % bolus 1,000 mL (0 mLs Intravenous Stopped 01/29/24 0056)  ondansetron (ZOFRAN) injection 4 mg (4 mg Intravenous Given 01/28/24 2244)  magnesium sulfate IVPB 2 g 50 mL (0 g Intravenous Stopped 01/29/24 0056)    ED Course/ Medical Decision Making/ A&P                                 Medical Decision Making Amount and/or Complexity of Data Reviewed Labs: ordered. Radiology: ordered.  Risk Prescription drug management. Decision regarding hospitalization.   [name] [name] 68 y.o. presented today for chest pain. Working DDx that I considered at this time includes, but not limited to, COPD, hepatobiliary, ACS, pulmonary embolism, community-acquired pneumonia, aortic dissection, pneumothorax, underlying bony abnormality, anemia, thyrotoxicosis, HTN urgency/emergency, esophageal rupture, CHF exacerbation, valvular disorder, myocarditis, pericarditis, endocarditis, pericardial effusion/cardiac tamponade, pulmonary edema, gastritis/PUD/GERD, esophagitis, MSK.  R/o Dx: ACS, pulmonary embolism, community-acquired pneumonia, aortic dissection, pneumothorax, underlying bony abnormality, anemia, thyrotoxicosis, HTN urgency/emergency, esophageal rupture, CHF exacerbation, valvular disorder, myocarditis, pericarditis, endocarditis, pericardial effusion/cardiac tamponade, pulmonary edema, gastritis/PUD/GERD, esophagitis, MSK: These are considered less likely due to history of present illness and physical exam findings. Aortic Dissection: less likely based on the location, quality, onset, and severity of symptoms in this case.  Review of prior external notes: 01/23/2024 progress Notes  Unique Tests and My Interpretation:  EKG: Rate, rhythm, axis, intervals all examined and without medically relevant abnormality. ST segments  without concerns for elevations Troponin: 5, 5 CXR: No acute cardiopulmonary pathology CBC: Mild leukocytosis 12.3 BMP: Unremarkable Hepatic function panel: Transaminitis 192/190, alk phos 217, total bili 3.9, direct bili 2.5 Lipase: 798 Respiratory panel: Negative Right upper quadrant ultrasound: Cholelithiasis without any acute pathology  Social Determinants of Health: Can't afford meds  Discussion with Independent Historian: None  Discussion of Management  of Tests:  Sherrilee Gilles, PGY-2 IM Resident;  Mansouraty, MD GI  Risk: High: hospitalization or escalation of hospital-level care  Risk Stratification Score: none  Plan: On exam patient was in no acute distress was noticed to have soft blood pressures of 114/45 so we will recheck this and give fluids. Patient's physical was remarkable for bilateral decreased breath sounds along with some expiratory wheezing.  So far labs and chest x-ray from triage are reassuring however will add on hepatic function panel along with lipase as patient is endorsing emesis as well however has soft nontender abdomen and so question of her symptoms are viral related she is endorsing pain everywhere along with viral-like symptoms.  The cardiac monitor was ordered secondary to the patient's history of chest pain and to monitor the patient for dysrhythmia. The cardiac monitor revealed normal sinus as interpreted by me.  Will give breathing treatment along with Solu-Medrol and Magnesium to help with patient's breathing I suspect she is having COPD exacerbation possibly from a viral illness or bronchitis.  Will place patient on oxygen and ambulate her as she is supposed be on 3 L at baseline however has been unable to obtain an oxygen tank at home due to insurance purposes.  Patient stable at this time.  Patient told nursing that she think she may have a UTI and so we will order a UA.  UA came back positive for nitrate and patient states that she is urinating more often  and so you suspect that she has UTI that we will treat.  Patient's hepatic function panel does show significant transaminitis along with elevated alkaline phos and total bili along with lipase being elevated at 798.  Again patient has not generalized tenderness in her abdomen but without peritoneal signs.  Will start with ultrasound as I do suspect she may have a stone causing all of her symptoms.  Reevaluation of patient's lungs do show clear lungs bilaterally and patient states she feels so much better after the breathing treatment and steroids and so I do suspect her chest pain could have been caused by underlying COPD and may also be from a stone in the hepatobiliary system.  Ultrasound was remarkable for cholelithiasis however no signs of infection.  Will consult GI given elevated lipase and transaminitis and alk phos.  I spoke to GI and they state to get MRCP and to repeat the hepatic function panel in 6 to 12 hours.  Will order MRCP and consult hospitalist for admission.  I spoke to the hospitalist and they will admit the patient.  This chart was dictated using voice recognition software.  Despite best efforts to proofread,  errors can occur which can change the documentation meaning.        Final Clinical Impression(s) / ED Diagnoses Final diagnoses:  Transaminitis  Acute biliary pancreatitis without infection or necrosis  Chronic obstructive pulmonary disease, unspecified COPD type [facility])    Rx / DC Orders ED Discharge Orders     None         Remi Deter 01/29/24 0254    Linwood Dibbles, MD 01/29/24 [phone]

## 2024-01-28 NOTE — ED Triage Notes (Signed)
 Patient BIB [facility] w/ chest pain radiating to mid back with nausea. Describes it as a pressure like someone is sitting on the chest.  Denies ShOB. Vomited after receiving 324 ASA.

## 2024-01-28 NOTE — ED Provider Triage Note (Signed)
 Emergency Medicine Provider Triage Evaluation Note  [name] [name] , a 68 y.o. female  was evaluated in triage.  Pt complains of CP and back pain. Patient with tightness in the center chest which is intermittent. No SOB. No abdominal pain. Patient also with diffuse back pain which is similar to her chronic back pain but diffuse and more severe than normal.   Review of Systems  Positive: CP and back pain Negative: SOB, numbness, fever  Physical Exam  BP (!) 114/45 (BP Location: Right Arm)   Pulse 77   Temp 98.2 F (36.8 C) (Oral)   Resp 18   Ht 5\' 8"  (1.727 m)   Wt 88.9 kg   SpO2 96%   BMI 29.80 kg/m  Gen:   Awake, no distress   Resp:  Normal effort; CTABL MSK:   Moves extremities without difficulty  Other:  No midline back pain  Medical Decision Making  Medically screening exam initiated at 8:12 PM.  Appropriate orders placed.  Shelah Heatley Potenza was informed that the remainder of the evaluation will be completed by another provider, this initial triage assessment does not replace that evaluation, and the importance of remaining in the ED until their evaluation is complete.       Maia Plan, MD 01/28/24 2016

## 2024-01-28 NOTE — ED Provider Notes (Incomplete)
 [name] [name] Provider Note   CSN: [id number] Arrival date & time: 01/28/24  1936     History {Add pertinent medical, surgical, social history, OB history to HPI:1} Chief Complaint  Patient presents with  . Chest Pain  . Back Pain    [name] [name] is a 68 y.o. female history of COPD on 3 L baseline, OSA, anxiety, dilated aortic arch presented for 3 weeks of chest pain shortness of breath.  Patient states that the discomfort was around her chest and going to her abdomen which goes all around.  Patient does state that she try to eat today and vomited up her food but denies any abdominal pain besides that at the moment.  Patient is state that the chest pain and back pain feels separate.  Patient has been unable to use the home oxygen at home due to insurance reasons but states she does not feel short of breath.  Patient denies productive cough or fevers.  Patient was recently admitted for the flu.  Home Medications Prior to Admission medications   Medication Sig Start Date End Date Taking? Authorizing Provider  acetaminophen (TYLENOL) 500 MG tablet Take 1,000 mg by mouth every 6 (six) hours as needed (for pain OR back pain).     [provider]  albuterol (VENTOLIN HFA) 108 (90 Base) MCG/ACT inhaler Inhale 2 puffs into the lungs every 6 (six) hours as needed for wheezing or shortness of breath. 01/11/24   [name] Sheldon, MD  ALPRAZolam Prudy Feeler) 1 MG tablet Take 1 tablet (1 mg total) by mouth 2 (two) times daily as needed for anxiety. 11/06/23   [name] Overly T, PA-C  diclofenac Sodium (VOLTAREN) 1 % GEL Apply 2 g topically 4 (four) times daily. Patient taking differently: Apply 2 g topically daily as needed (for pain). 11/23/23   [name], [name], DO  gabapentin (NEURONTIN) 400 MG capsule TAKE 1 CAPSULE(400 MG) BY MOUTH TWICE DAILY 01/07/24   Hurst, Rosey Bath T, PA-C  MYRBETRIQ 25 MG TB24 tablet TAKE 1 TABLET(25 MG) BY MOUTH DAILY 01/12/24    [name], [name], DO  OZEMPIC, 1 MG/DOSE, 4 MG/3ML SOPN INJECT 1 MG UNDER THE SKIN ONE DAY A WEEK 01/08/24   [name], [name], DO  pantoprazole (PROTONIX) 40 MG tablet TAKE 1 TABLET(40 MG) BY MOUTH DAILY 04/12/23   [name], [name] Addison, DO  sertraline (ZOLOFT) 100 MG tablet TAKE 1 TABLET BY MOUTH EVERY DAY 12/04/23   [name] Overly T, PA-C  traZODone (DESYREL) 150 MG tablet Take 1 tablet (150 mg total) by mouth at bedtime. 08/11/23   [name] Ouch, PA-C      Allergies    Ibuprofen and Clonazepam    Review of Systems   Review of Systems  Cardiovascular:  Positive for chest pain.  Musculoskeletal:  Positive for back pain.    Physical Exam Updated Vital Signs BP (!) 114/45 (BP Location: Right Arm)   Pulse 77   Temp 98.2 F (36.8 C) (Oral)   Resp 18   Ht 5\' 8"  (1.727 m)   Wt 88.9 kg   SpO2 96%   BMI 29.80 kg/m  Physical Exam Vitals reviewed.  Constitutional:      General: She is not in acute distress. HENT:     Head: Normocephalic and atraumatic.  Eyes:     Extraocular Movements: Extraocular movements intact.     Conjunctiva/sclera: Conjunctivae normal.     Pupils: Pupils are equal, round, and reactive to light.  Cardiovascular:  Rate and Rhythm: Normal rate and regular rhythm.     Pulses: Normal pulses.     Heart sounds: Normal heart sounds.     Comments: 2+ bilateral radial/dorsalis pedis pulses with regular rate Pulmonary:     Effort: No respiratory distress.     Breath sounds: Decreased breath sounds and wheezing present.     Comments: Speaking full sentences but does become winded Abdominal:     Palpations: Abdomen is soft.     Tenderness: There is no abdominal tenderness. There is no guarding or rebound.  Musculoskeletal:        General: Normal range of motion.     Cervical back: Normal range of motion and neck supple.     Comments: 5 out of 5 bilateral grip/leg extension strength  Skin:    General: Skin is warm and dry.     Capillary Refill: Capillary refill takes less  than 2 seconds.  Neurological:     General: No focal deficit present.     Mental Status: She is alert and oriented to person, place, and time.     Comments: Sensation intact in all 4 limbs  Psychiatric:        Mood and Affect: Mood normal.     ED Results / Procedures / Treatments   Labs (all labs ordered are listed, but only abnormal results are displayed) Labs Reviewed  BASIC METABOLIC PANEL - Abnormal; Notable for the following components:      Result Value   Glucose, Bld 122 (*)    All other components within normal limits  CBC - Abnormal; Notable for the following components:   WBC 12.3 (*)    RBC 5.30 (*)    Hemoglobin 16.3 (*)    HCT 48.2 (*)    Platelets 128 (*)    All other components within normal limits  TROPONIN I (HIGH SENSITIVITY)  TROPONIN I (HIGH SENSITIVITY)    EKG None  Radiology DG Chest 2 View Result Date: 01/28/2024 CLINICAL DATA:  Chest pain EXAM: CHEST - 2 VIEW COMPARISON:  01/08/2024 FINDINGS: Melene Muller is within normal limits. The lungs are well aerated bilaterally. No focal infiltrate or effusion is seen. No bony abnormality is noted. IMPRESSION: No active cardiopulmonary disease. Electronically Signed   By: [name] [name] M.D.   On: 01/28/2024 20:42    Procedures Procedures  {Document cardiac monitor, telemetry assessment procedure when appropriate:1}  Medications Ordered in ED Medications  ipratropium-albuterol (DUONEB) 0.5-2.5 (3) MG/3ML nebulizer solution 3 mL (has no administration in time range)  methylPREDNISolone sodium succinate (SOLU-MEDROL) 125 mg/2 mL injection 125 mg (has no administration in time range)  sodium chloride 0.9 % bolus 1,000 mL (has no administration in time range)  ondansetron (ZOFRAN) injection 4 mg (has no administration in time range)    ED Course/ Medical Decision Making/ A&P   {   Click here for ABCD2, HEART and other calculatorsREFRESH Note before signing :1}                              Medical Decision  Making Amount and/or Complexity of Data Reviewed Labs: ordered. Radiology: ordered.  Risk Prescription drug management.   [name] [name] 68 y.o. presented today for chest pain. Working DDx that I considered at this time includes, but not limited to, ACS, pulmonary embolism, community-acquired pneumonia, aortic dissection, pneumothorax, underlying bony abnormality, anemia, thyrotoxicosis, HTN urgency/emergency, esophageal rupture, CHF exacerbation, valvular disorder, myocarditis, pericarditis, endocarditis,  pericardial effusion/cardiac tamponade, pulmonary edema, gastritis/PUD/GERD, esophagitis, MSK.  R/o Dx: ***: These are considered less likely due to history of present illness and physical exam findings. *** PE: CTA negative ***D-dimer negative ***Well's Score is *** and so advanced imaging will not be ordered as alternative diagnoses are more likely at this time Aortic Dissection: less likely based on the location, quality, onset, and severity of symptoms in this case. Patient also has a lack of underlying history of AD or TAA.   Review of prior external notes: ***  Unique Tests and My Interpretation:  EKG: Rate, rhythm, axis, intervals all examined and without medically relevant abnormality. ST segments without concerns for elevations Troponin: 5 CXR: No acute cardiopulmonary pathology CBC: Mild leukocytosis 12.3 BMP: Unremarkable Hepatic function panel: Lipase: Respiratory panel:  Social Determinants of Health: Can'[name] afford meds  Discussion with Independent Historian: None  Discussion of Management of Tests: {historian:29369}  Risk: {Risk:29370}  Risk Stratification Score: HEART:  Staffed with ***  Plan: On exam patient was in no acute distress was noticed to have soft blood pressures of 114/45 so we will recheck this and give fluids. Patient's physical was remarkable for bilateral decreased breath sounds along with some expiratory wheezing.  So far labs and  chest x-ray from triage are reassuring however will add on hepatic function panel along with lipase as patient is endorsing emesis as well however has soft nontender abdomen and so question of her symptoms are viral related she is endorsing pain everywhere along with viral-like symptoms.  The cardiac monitor was ordered secondary to the patient's history of chest pain and to monitor the patient for dysrhythmia. The cardiac monitor revealed normal sinus as interpreted by me.  Will give breathing treatment along with Solu-Medrol and Magnesium to help with patient's breathing I suspect she is having COPD exacerbation possibly from a viral illness or bronchitis.  Will place patient on oxygen and ambulate her as she is supposed be on 3 L at baseline however has been unable to obtain an oxygen tank at home due to insurance purposes.  Patient stable at this time.  Patient told nursing that she think she may have a UTI and so we will order a UA.  Patient was given return precautions. Patient stable for discharge at this time.  Patient verbalized understanding of plan.  Discussed smoking cessation with patient and they were offered resources to help stop.  Total time was 5 min CPT code 16109.   This chart was dictated using voice recognition software.  Despite best efforts to proofread,  errors can occur which can change the documentation meaning.  {Document critical care time when appropriate:1} {Document review of labs and clinical decision tools ie heart score, Chads2Vasc2 etc:1}  {Document your independent review of radiology images, and any outside records:1} {Document your discussion with family members, caretakers, and with consultants:1} {Document social determinants of health affecting pt's care:1} {Document your decision making why or why not admission, treatments were needed:1} Final Clinical Impression(s) / ED Diagnoses Final diagnoses:  None    Rx / DC Orders ED Discharge Orders     None

## 2024-01-29 ENCOUNTER — Emergency Department (HOSPITAL_COMMUNITY)

## 2024-01-29 ENCOUNTER — Encounter (HOSPITAL_COMMUNITY): Payer: Self-pay | Admitting: Radiology

## 2024-01-29 ENCOUNTER — Ambulatory Visit (INDEPENDENT_AMBULATORY_CARE_PROVIDER_SITE_OTHER): Payer: Self-pay | Admitting: Physician Assistant

## 2024-01-29 DIAGNOSIS — K567 Ileus, unspecified: Secondary | ICD-10-CM | POA: Diagnosis not present

## 2024-01-29 DIAGNOSIS — R7401 Elevation of levels of liver transaminase levels: Secondary | ICD-10-CM | POA: Diagnosis present

## 2024-01-29 DIAGNOSIS — K219 Gastro-esophageal reflux disease without esophagitis: Secondary | ICD-10-CM | POA: Diagnosis present

## 2024-01-29 DIAGNOSIS — I700 Atherosclerosis of aorta: Secondary | ICD-10-CM | POA: Diagnosis not present

## 2024-01-29 DIAGNOSIS — K760 Fatty (change of) liver, not elsewhere classified: Secondary | ICD-10-CM | POA: Diagnosis present

## 2024-01-29 DIAGNOSIS — Z803 Family history of malignant neoplasm of breast: Secondary | ICD-10-CM | POA: Diagnosis not present

## 2024-01-29 DIAGNOSIS — Z886 Allergy status to analgesic agent status: Secondary | ICD-10-CM | POA: Diagnosis not present

## 2024-01-29 DIAGNOSIS — Z66 Do not resuscitate: Secondary | ICD-10-CM | POA: Diagnosis present

## 2024-01-29 DIAGNOSIS — Z9981 Dependence on supplemental oxygen: Secondary | ICD-10-CM

## 2024-01-29 DIAGNOSIS — Z87891 Personal history of nicotine dependence: Secondary | ICD-10-CM | POA: Diagnosis not present

## 2024-01-29 DIAGNOSIS — K8065 Calculus of gallbladder and bile duct with chronic cholecystitis with obstruction: Secondary | ICD-10-CM | POA: Diagnosis present

## 2024-01-29 DIAGNOSIS — D696 Thrombocytopenia, unspecified: Secondary | ICD-10-CM | POA: Diagnosis present

## 2024-01-29 DIAGNOSIS — Z91199 Patient's noncompliance with other medical treatment and regimen due to unspecified reason: Secondary | ICD-10-CM

## 2024-01-29 DIAGNOSIS — J449 Chronic obstructive pulmonary disease, unspecified: Secondary | ICD-10-CM | POA: Diagnosis not present

## 2024-01-29 DIAGNOSIS — R7989 Other specified abnormal findings of blood chemistry: Secondary | ICD-10-CM | POA: Diagnosis present

## 2024-01-29 DIAGNOSIS — G8929 Other chronic pain: Secondary | ICD-10-CM | POA: Diagnosis present

## 2024-01-29 DIAGNOSIS — G4700 Insomnia, unspecified: Secondary | ICD-10-CM | POA: Diagnosis present

## 2024-01-29 DIAGNOSIS — E876 Hypokalemia: Secondary | ICD-10-CM | POA: Diagnosis present

## 2024-01-29 DIAGNOSIS — Z90710 Acquired absence of both cervix and uterus: Secondary | ICD-10-CM | POA: Diagnosis not present

## 2024-01-29 DIAGNOSIS — K859 Acute pancreatitis without necrosis or infection, unspecified: Secondary | ICD-10-CM | POA: Insufficient documentation

## 2024-01-29 DIAGNOSIS — K851 Biliary acute pancreatitis without necrosis or infection: Secondary | ICD-10-CM | POA: Diagnosis present

## 2024-01-29 DIAGNOSIS — K8020 Calculus of gallbladder without cholecystitis without obstruction: Secondary | ICD-10-CM | POA: Diagnosis present

## 2024-01-29 DIAGNOSIS — E669 Obesity, unspecified: Secondary | ICD-10-CM | POA: Diagnosis present

## 2024-01-29 DIAGNOSIS — K819 Cholecystitis, unspecified: Secondary | ICD-10-CM | POA: Diagnosis not present

## 2024-01-29 DIAGNOSIS — Z79899 Other long term (current) drug therapy: Secondary | ICD-10-CM | POA: Diagnosis not present

## 2024-01-29 DIAGNOSIS — I452 Bifascicular block: Secondary | ICD-10-CM | POA: Diagnosis present

## 2024-01-29 DIAGNOSIS — Z888 Allergy status to other drugs, medicaments and biological substances status: Secondary | ICD-10-CM | POA: Diagnosis not present

## 2024-01-29 DIAGNOSIS — K831 Obstruction of bile duct: Secondary | ICD-10-CM

## 2024-01-29 DIAGNOSIS — F32A Depression, unspecified: Secondary | ICD-10-CM | POA: Diagnosis present

## 2024-01-29 DIAGNOSIS — G4733 Obstructive sleep apnea (adult) (pediatric): Secondary | ICD-10-CM | POA: Diagnosis not present

## 2024-01-29 DIAGNOSIS — Z811 Family history of alcohol abuse and dependence: Secondary | ICD-10-CM | POA: Diagnosis not present

## 2024-01-29 DIAGNOSIS — J4489 Other specified chronic obstructive pulmonary disease: Secondary | ICD-10-CM | POA: Diagnosis present

## 2024-01-29 DIAGNOSIS — J9611 Chronic respiratory failure with hypoxia: Secondary | ICD-10-CM | POA: Diagnosis present

## 2024-01-29 LAB — HEPATIC FUNCTION PANEL
ALT: 190 U/L — ABNORMAL HIGH (ref 0–44)
AST: 192 U/L — ABNORMAL HIGH (ref 15–41)
Albumin: 3 g/dL — ABNORMAL LOW (ref 3.5–5.0)
Alkaline Phosphatase: 217 U/L — ABNORMAL HIGH (ref 38–126)
Bilirubin, Direct: 2.5 mg/dL — ABNORMAL HIGH (ref 0.0–0.2)
Indirect Bilirubin: 1.4 mg/dL — ABNORMAL HIGH (ref 0.3–0.9)
Total Bilirubin: 3.9 mg/dL — ABNORMAL HIGH (ref 0.0–1.2)
Total Protein: 6 g/dL — ABNORMAL LOW (ref 6.5–8.1)

## 2024-01-29 LAB — COMPREHENSIVE METABOLIC PANEL
ALT: 190 U/L — ABNORMAL HIGH (ref 0–44)
AST: 130 U/L — ABNORMAL HIGH (ref 15–41)
Albumin: 3.1 g/dL — ABNORMAL LOW (ref 3.5–5.0)
Alkaline Phosphatase: 208 U/L — ABNORMAL HIGH (ref 38–126)
Anion gap: 11 (ref 5–15)
BUN: 7 mg/dL — ABNORMAL LOW (ref 8–23)
CO2: 25 mmol/L (ref 22–32)
Calcium: 9 mg/dL (ref 8.9–10.3)
Chloride: 100 mmol/L (ref 98–111)
Creatinine, Ser: 0.63 mg/dL (ref 0.44–1.00)
GFR, Estimated: >60 mL/min (ref >60)
Glucose, Bld: 147 mg/dL — ABNORMAL HIGH (ref 70–99)
Potassium: 3.8 mmol/L (ref 3.5–5.1)
Sodium: 136 mmol/L (ref 135–145)
Total Bilirubin: 1.5 mg/dL — ABNORMAL HIGH (ref 0.0–1.2)
Total Protein: 6.3 g/dL — ABNORMAL LOW (ref 6.5–8.1)

## 2024-01-29 LAB — LIPID PANEL
Cholesterol: 150 mg/dL (ref 0–200)
HDL: 56 mg/dL (ref >40)
LDL Cholesterol: 83 mg/dL (ref 0–99)
Total CHOL/HDL Ratio: 2.7 ratio
Triglycerides: 55 mg/dL (ref <150)
VLDL: 11 mg/dL (ref 0–40)

## 2024-01-29 LAB — RESP PANEL BY RT-PCR (RSV, FLU A&B, COVID)  RVPGX2
Influenza A by PCR: NEGATIVE
Influenza B by PCR: NEGATIVE
Resp Syncytial Virus by PCR: NEGATIVE
SARS Coronavirus 2 by RT PCR: NEGATIVE

## 2024-01-29 LAB — CBC
HCT: 42 % (ref 36.0–46.0)
Hemoglobin: 14.8 g/dL (ref 12.0–15.0)
MCH: 31.2 pg (ref 26.0–34.0)
MCHC: 35.2 g/dL (ref 30.0–36.0)
MCV: 88.4 fL (ref 80.0–100.0)
Platelets: 118 10*3/uL — ABNORMAL LOW (ref 150–400)
RBC: 4.75 MIL/uL (ref 3.87–5.11)
RDW: 12.7 % (ref 11.5–15.5)
WBC: 10.3 10*3/uL (ref 4.0–10.5)
nRBC: 0 % (ref 0.0–0.2)

## 2024-01-29 LAB — LIPASE, BLOOD: Lipase: 798 U/L — ABNORMAL HIGH (ref 11–51)

## 2024-01-29 LAB — TROPONIN I (HIGH SENSITIVITY): Troponin I (High Sensitivity): 5 ng/L (ref <18)

## 2024-01-29 LAB — HEPATITIS PANEL, ACUTE
HCV Ab: NONREACTIVE
Hep A IgM: NONREACTIVE
Hep B C IgM: NONREACTIVE
Hepatitis B Surface Ag: NONREACTIVE

## 2024-01-29 MED ORDER — ALPRAZOLAM 0.5 MG PO TABS
0.5000 mg | ORAL_TABLET | Freq: Every day | ORAL | Status: DC
Start: 2024-01-29 — End: 2024-02-04
  Administered 2024-01-29 – 2024-02-03 (×6): 1 mg via ORAL
  Filled 2024-01-29 (×6): qty 2

## 2024-01-29 MED ORDER — HYDROMORPHONE HCL 1 MG/ML IJ SOLN: 1.0000 mg | INTRAMUSCULAR | Status: DC | PRN

## 2024-01-29 MED ORDER — PANTOPRAZOLE SODIUM 40 MG PO TBEC
40.0000 mg | DELAYED_RELEASE_TABLET | Freq: Every day | ORAL | Status: DC
  Administered 2024-01-29 – 2024-02-04 (×6): 40 mg via ORAL
  Filled 2024-01-29 (×6): qty 1

## 2024-01-29 MED ORDER — SERTRALINE HCL 25 MG PO TABS
50.0000 mg | ORAL_TABLET | Freq: Every day | ORAL | Status: DC
  Administered 2024-01-29 – 2024-02-04 (×6): 50 mg via ORAL
  Filled 2024-01-29 (×6): qty 2

## 2024-01-29 MED ORDER — ALUM & MAG HYDROXIDE-SIMETH 200-200-20 MG/5ML PO SUSP
15.0000 mL | Freq: Once | ORAL | Status: AC
  Administered 2024-01-29: 15 mL via ORAL
  Filled 2024-01-29: qty 30

## 2024-01-29 MED ORDER — ACETAMINOPHEN 325 MG PO TABS
650.0000 mg | ORAL_TABLET | Freq: Four times a day (QID) | ORAL | Status: DC | PRN
  Administered 2024-02-01: 650 mg via ORAL
  Filled 2024-01-29: qty 2

## 2024-01-29 MED ORDER — IPRATROPIUM-ALBUTEROL 0.5-2.5 (3) MG/3ML IN SOLN: 3.0000 mL | RESPIRATORY_TRACT | Status: DC | PRN

## 2024-01-29 MED ORDER — SENNOSIDES-DOCUSATE SODIUM 8.6-50 MG PO TABS
1.0000 | ORAL_TABLET | Freq: Every evening | ORAL | Status: DC | PRN
  Administered 2024-01-31: 1 via ORAL
  Filled 2024-01-29: qty 1

## 2024-01-29 MED ORDER — TRAZODONE HCL 50 MG PO TABS
150.0000 mg | ORAL_TABLET | Freq: Every day | ORAL | Status: DC
  Administered 2024-01-29 – 2024-02-03 (×6): 150 mg via ORAL
  Filled 2024-01-29 (×6): qty 3

## 2024-01-29 MED ORDER — ENOXAPARIN SODIUM 40 MG/0.4ML IJ SOSY
40.0000 mg | PREFILLED_SYRINGE | Freq: Every day | INTRAMUSCULAR | Status: DC
  Administered 2024-01-29 – 2024-01-30 (×2): 40 mg via SUBCUTANEOUS
  Filled 2024-01-29 (×2): qty 0.4

## 2024-01-29 MED ORDER — HYDROMORPHONE HCL 1 MG/ML IJ SOLN
0.5000 mg | INTRAMUSCULAR | Status: DC | PRN
  Administered 2024-01-31 – 2024-02-01 (×4): 0.5 mg via INTRAVENOUS
  Filled 2024-01-29 (×4): qty 0.5

## 2024-01-29 MED ORDER — GADOBUTROL 1 MMOL/ML IV SOLN
10.0000 mL | Freq: Once | INTRAVENOUS | Status: AC | PRN
  Administered 2024-01-29: 10 mL via INTRAVENOUS

## 2024-01-29 MED ORDER — LACTATED RINGERS IV SOLN: INTRAVENOUS | Status: AC

## 2024-01-29 MED ORDER — ACETAMINOPHEN 650 MG RE SUPP: 650.0000 mg | Freq: Four times a day (QID) | RECTAL | Status: DC | PRN

## 2024-01-29 MED ORDER — MORPHINE SULFATE (PF) 4 MG/ML IV SOLN: 4.0000 mg | Freq: Once | INTRAVENOUS | Status: DC

## 2024-01-29 MED ORDER — ONDANSETRON HCL 4 MG/2ML IJ SOLN
4.0000 mg | Freq: Three times a day (TID) | INTRAMUSCULAR | Status: DC | PRN
  Administered 2024-01-29: 4 mg via INTRAVENOUS
  Filled 2024-01-29: qty 2

## 2024-01-29 NOTE — Progress Notes (Signed)
 No show

## 2024-01-29 NOTE — Hospital Course (Addendum)
 Gallstone pancreatitis, s/p cholecystectomy Common biliary duct stricture, inflammatory Post-op ileus Presented with acute pancreatitis and cholestatic liver injury.  Right upper quadrant ultrasound showed cholelithiasis but no evidence of cholecystitis.  No alcohol use, triglycerides normal.  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  Likely gallstone pancreatitis with a passed stone.  Taken for laparoscopic cholecystectomy on 3/26 with intraoperative cholangiogram. Surgical pathology showed chronic cholecystitis. Tolerated this procedure well.  Had 2 days of postop ileus, but was able to pass gas and have a bowel movement with resumption of a bowel regimen, ambulation, and advance of her diet.  Following her surgery, her LFTs and bilirubin returned normal.  Surgery signed off.  She is medically stable for discharge with close outpatient follow-up with her PCP and surgical team. Discharged with miralax, robaxin and a short course of oxycodone.    COPD/Chronic hypoxic respiratory failure on 3L [place] No concerns of exacerbation during her hospital stay.  She satted well on room air.  Thrombocytopenia Stable, chronic. Unclear etiology. 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   Anxiety/Depression Stable. On Zoloft 50 mg daily, Xanax 1 mg nightly.   Insomnia Trazodone nightly.  GERD Stable. On a daily PPI.   Obesity On ozempic prior to admission, held.

## 2024-01-29 NOTE — Care Management Obs Status (Signed)
 MEDICARE OBSERVATION STATUS NOTIFICATION   Patient Details  Name: [name] [name] MRN: [id number] Date of Birth: [birth year]   Medicare Observation Status Notification Given:  Yes    Harriet Masson, RN 01/29/2024, 10:19 AM

## 2024-01-29 NOTE — Plan of Care (Signed)
  Problem: Education: Goal: Knowledge of General Education information will improve Description: Including pain rating scale, medication(s)/side effects and non-pharmacologic comfort measures 01/29/2024 1908 by Heron Nay, RN Outcome: Progressing 01/29/2024 0624 by Heron Nay, RN Outcome: Progressing   Problem: Health Behavior/Discharge Planning: Goal: Ability to manage health-related needs will improve 01/29/2024 1908 by Heron Nay, RN Outcome: Progressing 01/29/2024 0624 by Heron Nay, RN Outcome: Progressing   Problem: Clinical Measurements: Goal: Ability to maintain clinical measurements within normal limits will improve 01/29/2024 1908 by Heron Nay, RN Outcome: Progressing 01/29/2024 0624 by Heron Nay, RN Outcome: Progressing Goal: Will remain free from infection 01/29/2024 1908 by Heron Nay, RN Outcome: Progressing 01/29/2024 0624 by Heron Nay, RN Outcome: Progressing Goal: Diagnostic test results will improve 01/29/2024 1908 by Heron Nay, RN Outcome: Progressing 01/29/2024 0624 by Heron Nay, RN Outcome: Progressing Goal: Respiratory complications will improve 01/29/2024 1908 by Heron Nay, RN Outcome: Progressing 01/29/2024 0624 by Heron Nay, RN Outcome: Progressing Goal: Cardiovascular complication will be avoided 01/29/2024 1908 by Heron Nay, RN Outcome: Progressing 01/29/2024 0624 by Heron Nay, RN Outcome: Progressing   Problem: Activity: Goal: Risk for activity intolerance will decrease 01/29/2024 1908 by Heron Nay, RN Outcome: Progressing 01/29/2024 0624 by Heron Nay, RN Outcome: Progressing   Problem: Nutrition: Goal: Adequate nutrition will be maintained 01/29/2024 1908 by Heron Nay, RN Outcome: Progressing 01/29/2024 0624 by Heron Nay, RN Outcome: Progressing   Problem: Coping: Goal: Level of anxiety will decrease 01/29/2024 1908 by Heron Nay, RN Outcome: Progressing 01/29/2024 0624 by Heron Nay,  RN Outcome: Progressing   Problem: Elimination: Goal: Will not experience complications related to bowel motility 01/29/2024 1908 by Heron Nay, RN Outcome: Progressing 01/29/2024 0624 by Heron Nay, RN Outcome: Progressing Goal: Will not experience complications related to urinary retention 01/29/2024 1908 by Heron Nay, RN Outcome: Progressing 01/29/2024 0624 by Heron Nay, RN Outcome: Progressing   Problem: Pain Managment: Goal: General experience of comfort will improve and/or be controlled 01/29/2024 1908 by Heron Nay, RN Outcome: Progressing 01/29/2024 0624 by Heron Nay, RN Outcome: Progressing   Problem: Safety: Goal: Ability to remain free from injury will improve 01/29/2024 1908 by Heron Nay, RN Outcome: Progressing 01/29/2024 0624 by Heron Nay, RN Outcome: Progressing   Problem: Skin Integrity: Goal: Risk for impaired skin integrity will decrease 01/29/2024 1908 by Heron Nay, RN Outcome: Progressing 01/29/2024 0624 by Heron Nay, RN Outcome: Progressing

## 2024-01-29 NOTE — H&P (Addendum)
 Date: 01/29/2024               Patient Name:  [name] [name] MRN: [id number]  DOB: 18-Jul-1956 Age / Sex: 68 y.o., female   PCP: Rudene Christians, DO         Medical Service: Internal Medicine Teaching Service         Attending Physician: Dr. Reymundo Poll, MD      First Contact: Dr. Annett Fabian, MD Pager [phone]    Second Contact: Dr. Modena Slater, DO Pager [phone]         After Hours (After 5p/  First Contact Pager: [phone]  weekends / holidays): Second Contact Pager: [phone]   SUBJECTIVE   Chief Complaint: chest and abdominal pain   History of Present Illness: [name] [name] is a 67 y.o. with PMH of COPD, chronic hypoxic respiratory failure on 3L [place], anxiety, GERD.   Presented with chest and abdominal pain for past 1-2 weeks. Describes as chest pressure is episodic and can last for several minutes. No tenderness of chest wall. No recent injury or trauma. Rest improves pain. Abdominal pain is in upper quadrants and aching/cramping pains.  Endorses nausea and vomiting. Did not tolerate food/fluids earlier today. Pain worsened on day of admission after church that she called EMS. Has had some SOB. Currently on 3L [place] at home and has not required more than that. Denies any productive cough or sputum, fevers or chills. Denies any recent sick contacts. Reports some urinary frequency but no dysuria or hematuria.   Was recently treated for acute hypoxic respiratory failure 2/2 Flu A. Was discharged with supplemental O2 and HH.   ED Course: Hemodynamically stable on arrival. Afebrile. Labs significant for lipase 798 and elevated LFT cholestatic pattern with elevated bilirubin. WBC 12.3, PLT 128. Received 1L NS bolus, magnesium, solumedrol and duonebs in the ED. GI was consulted by EDP.   Meds:  -albuterol PRN -Xanax 1 mg BID PRN  -gabapentin 400 mg (not taken in 3 weeks, due to side effects) -Myrbetriq (not taking) -Ozempic 1 mg weekly -Protonix 40 mg daily  -Zoloft  50 mg daily (titrating down from 100 mg) -trazodone 150 mg at bedtime   No outpatient medications have been marked as taking for the 01/28/24 encounter [facility] Encounter).   Past Medical History Past Medical History:  Diagnosis Date   Acute hypoxic respiratory failure (HCC) 01/08/2024   Anxiety    Asthma    Chronic, with restrictive component 2/2 obesity   Bacteremia due to Escherichia coli 02/07/2023   COPD (chronic obstructive pulmonary disease) (HCC)    Depression    Family history of breast cancer 01/10/2024   Multiple relatives    GERD (gastroesophageal reflux disease)    Headache    Insomnia    Obesity    On home oxygen therapy    "3L at night only when I get sick" (04/30/2014)   Sepsis due to gram-negative UTI (HCC) 07/17/2023   Shortness of breath dyspnea    Sleep apnea    "couldn't afford mask so I didn't get it" (04/30/2014)    Past Surgical History:  Procedure Laterality Date   APPENDECTOMY  ~ 2002   VAGINAL HYSTERECTOMY  ~ 1978    Social:  Lives With: husband  Occupation: retired Support: son and husband  Level of Function: independent with ADL, has cane and walker  PCP: [name], Psychiatric nurse, DO Substances: -Alcohol: denies -Tobacco: former, quit 2 years ago, 2 ppd started at 68 yo  -Recreational  Drug: denies   Family History:  Family History  Problem Relation Age of Onset   Breast cancer Mother    Alcohol abuse Mother    Emphysema Mother        smoked   Asthma Mother    Breast cancer Maternal Aunt    Breast cancer Maternal Grandmother     Allergies: Allergies as of 01/28/2024 - Review Complete 01/28/2024  Allergen Reaction Noted   Ibuprofen Anaphylaxis, Hives, and Swelling 02/01/2012   Clonazepam Other (See Comments) 08/30/2019    Review of Systems: A complete ROS was negative except as per HPI.   OBJECTIVE:   Physical Exam: Blood pressure 111/73, pulse 73, temperature 98.1 F (36.7 C), temperature source Oral, resp. rate 16, height 5\' 8"   (1.727 m), weight 88.9 kg, SpO2 96% on 3L [place].  Constitutional: alert, sitting up in bed, in no acute distress HENT: normocephalic atraumatic Cardiovascular: RRR, no murmur appreciated Pulmonary/Chest: normal work of breathing on home 3L [place], lungs clear bilaterally, able to speak in full sentences, no accessory muscle use Abdominal: bowel sounds present, soft, non-distended, TTP of upper abdomen, no guarding or rebound  MSK: no LE edema Neurological: alert & oriented x 3 Skin: not jaundice, warm and dry   Labs: CBC    Component Value Date/Time   WBC 12.3 (H) 01/28/2024 2018   RBC 5.30 (H) 01/28/2024 2018   HGB 16.3 (H) 01/28/2024 2018   HGB 16.2 (H) 04/13/2023 1534   HCT 48.2 (H) 01/28/2024 2018   HCT 46.7 (H) 04/13/2023 1534   PLT 128 (L) 01/28/2024 2018   PLT 139 (L) 04/13/2023 1534   MCV 90.9 01/28/2024 2018   MCV 91 04/13/2023 1534   MCH 30.8 01/28/2024 2018   MCHC 33.8 01/28/2024 2018   RDW 12.7 01/28/2024 2018   RDW 13.4 04/13/2023 1534   LYMPHSABS 0.8 01/08/2024 1348   LYMPHSABS 1.8 04/13/2023 1534   MONOABS 0.5 01/08/2024 1348   EOSABS 0.0 01/08/2024 1348   EOSABS 0.1 04/13/2023 1534   BASOSABS 0.0 01/08/2024 1348   BASOSABS 0.0 04/13/2023 1534     CMP     Component Value Date/Time   NA 137 01/28/2024 2018   NA 139 08/07/2023 1456   K 3.6 01/28/2024 2018   CL 100 01/28/2024 2018   CO2 26 01/28/2024 2018   GLUCOSE 122 (H) 01/28/2024 2018   BUN 11 01/28/2024 2018   BUN 6 (L) 08/07/2023 1456   CREATININE 0.65 01/28/2024 2018   CREATININE 0.55 05/23/2014 1050   CALCIUM 9.5 01/28/2024 2018   PROT 6.0 (L) 01/28/2024 2305   PROT 6.0 08/07/2023 1456   ALBUMIN 3.0 (L) 01/28/2024 2305   ALBUMIN 3.9 08/07/2023 1456   AST 192 (H) 01/28/2024 2305   ALT 190 (H) 01/28/2024 2305   ALKPHOS 217 (H) 01/28/2024 2305   BILITOT 3.9 (H) 01/28/2024 2305   BILITOT 0.3 08/07/2023 1456   GFRNONAA >60 01/28/2024 2018   GFRNONAA >89 05/23/2014 1050   GFRAA >60 06/03/2020 1149    GFRAA >89 05/23/2014 1050   Lipase 798 Alk Phos 217 AST 192 ALT 190 Bili 3.9  Imaging:  US Abdomen Limited RUQ (LIVER/GB) CLINICAL DATA:  Elevated LFTs  EXAM: ULTRASOUND ABDOMEN LIMITED RIGHT UPPER QUADRANT  COMPARISON:  None Available.  FINDINGS: Gallbladder:  Gallbladder is well distended with single gallstone. No wall thickening or pericholecystic fluid is noted.  Common bile duct:  Diameter: 4.4 mm.  Liver:  Diffusely increased in echogenicity without focal mass. Portal vein  is patent on color Doppler imaging with normal direction of blood flow towards the liver.  Other: None.  IMPRESSION: Cholelithiasis without complicating factors.  Fatty liver.  Electronically Signed   By: Alcide Clever M.D.   On: 01/29/2024 01:53  EKG: personally reviewed my interpretation is sinus rhythm, RBBB and LAFB which are seen on prior EKG.  ASSESSMENT & PLAN:   Assessment & Plan by Problem: Principal Problem:   Cholelithiasis Active Problems:   COPD (chronic obstructive pulmonary disease) (HCC)   Thrombocytopenia (HCC)   Pancreatitis, acute   Elevated LFTs   [name] [name] is a 68 y.o. person living with a history of COPD, chronic hypoxic respiratory failure on 3L [place], anxiety, GERD who presented with chest and abdominal pain and admitted for acute pancreatitis on hospital day 0  # Elevated liver enzymes and bilirubin  # Acute pancreatitis  # Cholelithiasis  # Leukocytosis  Upper quadrant abdominal pain for past 1 week with associated n/v and back pain. Elevated lipase, LFT and bilirubin. RUQ u/s with gallstone but CBD not dilated, consider choledocholithiasis. Noted fatty liver but no focal lesions. GI was consulted by EDP, s/p IVF bolus. No EtOH history or hepatitis history. Has been checked for anti-smooth ab which was negative several years ago. Will treat for pancreatitis given presenting abdominal pain and elevated lipase.  - GI consulted, appreciate  assistance and f/u recs  - pending MRCP - trend CMP  - check hepatitis panel  - if no findings, consider rechecking anti-smooth ab for autoimmune hepatitis - pain control: tylenol PRN and dilaudid IV PRN  - zofran Q8H PRN - IVF: s/p 1L NS bolus in ED, start LR 125 ml/hr x 12 hours, reassess  - NPO for now, advance diet as tolerated   # COPD  # Hx of asthma  # Chronic hypoxic respiratory failure on 3L [place]  RVP negative. Afebrile. Mild leukocytosis. CXR without acute findings. S/p solumedrol, duoneb with improvement of symptoms. Lung exam unremarkable, stable on home 3L. Last PFT 2014 more consistent with asthma. Does not appear to be in an exacerbation at this time, denies recent cough or sputum production.  - need repeat PFT outpatient - duonebs PRN   # Atypical chest pain  Negative troponin. EKG with sinus rhythm, previously seen RBBB, no acute ischemic changes. CXR without acute findings. Low suspicion for ACS. Patient states chest pain has already improved.  - monitor for now   # Urinary frequency  # Hx of Urinary incontinence  UA with nitrites and many bacteria but no leukocytes and WBC 0-5. Endorses urinary frequency and odor. Denies dysuria or hematuria. No suprapubic pain on exam.  - could consider initiating abx   # Chronic thrombocytopenia  PLT 128 which is about baseline. No overt signs of bleeding.  - trend CBC, if drops then add on peripheral smear   # Anxiety # Depression - continue home Zoloft 50 mg  - PTA home Xanax 1 mg BID PRN, resume if needed/indicated    # Insomnia: can resume home trazodone qhs # GERD:  continue home PPI  # Obesity: on Ozempic for weight loss, not diabetic, last A1c 5.1   Diet: NPO VTE: Enoxaparin IVF:  LR ,125cc/hr Code: DNR/DNI Surrogate Decision Maker: Son [name] [name])  Prior to Admission Living Arrangement: Home, living with husband Anticipated Discharge Location: Home Barriers to Discharge: GI evaluation   Dispo: Admit patient to  Observation with expected length of stay less than 2 midnights.  Signed: Rana Snare,  DO Internal Medicine Resident PGY-2 Pager: [phone] 01/29/2024, 5:20 AM   Please contact on-call pager, weekdays after 5pm and weekends after 1pm, at [phone].

## 2024-01-29 NOTE — Plan of Care (Signed)

## 2024-01-29 NOTE — TOC Initial Note (Signed)
 Transition of Care [facility]) - Initial/Assessment Note    Patient Details  Name: [name] [name] MRN: [id number] Date of Birth: 1956/01/22  Transition of [facility]) CM/SW Contact:    Harriet Masson, RN Phone Number: 01/29/2024, 1:32 PM  Clinical Narrative:                 Spoke to patient regarding transition needs. Patient is active with [facility] for home health. Will need resumption PT, OT, RN, aide, SW. Kandee Keen with [facility] is aware of admission.  Patient has transportation home. TOC following.   Expected Discharge Plan: Home w Home Health Services Barriers to Discharge: Continued Medical Work up   Patient Goals and CMS Choice Patient states their goals for this hospitalization and ongoing recovery are:: return home with home health CMS Medicare.gov Compare Post Acute Care list provided to:: Patient Choice offered to / list presented to : Patient      Expected Discharge Plan and Services       Living arrangements for the past 2 months: Single Family Home                             HH Agency: [facility] Date [facility] Agency Contacted: 01/29/24 Time HH Agency Contacted: [phone] Representative spoke with at [facility] Agency: Kandee Keen  Prior Living Arrangements/Services Living arrangements for the past 2 months: Single Family Home                     Activities of Daily Living   ADL Screening (condition at time of admission) Independently performs ADLs?: Yes (appropriate for developmental age) Is the patient deaf or have difficulty hearing?: No Does the patient have difficulty seeing, even when wearing glasses/contacts?: No Does the patient have difficulty concentrating, remembering, or making decisions?: No  Permission Sought/Granted                  Emotional Assessment              Admission diagnosis:  Transaminitis [R74.01] Cholelithiasis [K80.20] Acute biliary pancreatitis without infection or necrosis [K85.10] Chronic obstructive  pulmonary disease, unspecified COPD type (HCC) [J44.9] Patient Active Problem List   Diagnosis Date Noted   Cholelithiasis 01/29/2024   Pancreatitis, acute 01/29/2024   Elevated LFTs 01/29/2024   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   Family history of breast cancer 01/10/2024   Discomfort of submandibular region 01/10/2024   Unilateral primary osteoarthritis, left knee 12/12/2023   Strain of left latissimus dorsi muscle 11/23/2023   Polypharmacy 07/17/2023   Traumatic rhabdomyolysis (HCC) 07/17/2023   Elevated liver enzymes 07/17/2023   Anxiety state 07/17/2023   Urinary incontinence 04/14/2023   Falls 04/13/2023   Thrombocytopenia (HCC) 04/13/2023   Osteopenia 09/16/2022   Pre-diabetes 08/01/2022   Posterior right knee pain 02/06/2019   COPD (chronic obstructive pulmonary disease) (HCC) 10/01/2018   OCD (obsessive compulsive disorder) 08/13/2018   Aortic atherosclerosis (HCC) 08/17/2016   Obesity hypoventilation syndrome (HCC) 01/21/2015   Healthcare maintenance 07/01/2014   OSA (obstructive sleep apnea) 04/04/2013   Influenza due to identified novel influenza A virus with other respiratory manifestations 11/28/2012   Anxiety disorder 07/02/2012   Depression 06/28/2012   Morbid obesity (HCC) 03/02/2012   GERD (gastroesophageal reflux disease) 02/05/2012   Smoking 02/05/2012   PCP:  Rudene Christians, DO Pharmacy:   [facility] [phone] - [place], [place] - [address] AT [address] &  [facility] [address] [place] [address] Phone: [phone] Fax: [phone]  [facility] [address] [place] [address] Phone: [phone] Fax: [phone]     Social Drivers of Health (SDOH) Social History: SDOH Screenings   Food Insecurity: No Food Insecurity (01/29/2024)  Housing: Low Risk  (01/29/2024)  Transportation Needs: No Transportation Needs (01/29/2024)  Utilities: Not At Risk (01/29/2024)  Alcohol Screen:  Low Risk  (12/07/2022)  Depression (PHQ2-9): Low Risk  (01/23/2024)  Financial Resource Strain: Medium Risk (12/07/2022)  Physical Activity: Insufficiently Active (12/07/2022)  Social Connections: Socially Integrated (01/29/2024)  Stress: No Stress Concern Present (12/07/2022)  Tobacco Use: Medium Risk (01/28/2024)   SDOH Interventions:     Readmission Risk Interventions    07/14/2023    3:15 PM  Readmission Risk Prevention Plan  Transportation Screening Complete  PCP or Specialist Appt within 5-7 Days Complete  Home Care Screening Complete  Medication Review (RN CM) Complete

## 2024-01-29 NOTE — ED Notes (Signed)
 Patient transported to MRI
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
## 2024-01-29 NOTE — Progress Notes (Signed)
 Pt ambulated in hallway today with O2 sat monitor and while walking pt's O2 sats stayed at 92% and above. The pt walked with standby assist and the walker, and on return to room patient has remained on room air. MD notified.

## 2024-01-30 ENCOUNTER — Other Ambulatory Visit (HOSPITAL_COMMUNITY): Payer: Self-pay | Admitting: Radiology

## 2024-01-30 ENCOUNTER — Encounter (HOSPITAL_COMMUNITY): Payer: Self-pay | Admitting: Internal Medicine

## 2024-01-30 ENCOUNTER — Ambulatory Visit: Payer: Self-pay | Admitting: General Surgery

## 2024-01-30 DIAGNOSIS — K851 Biliary acute pancreatitis without necrosis or infection: Secondary | ICD-10-CM | POA: Diagnosis not present

## 2024-01-30 DIAGNOSIS — K839 Disease of biliary tract, unspecified: Secondary | ICD-10-CM

## 2024-01-30 DIAGNOSIS — K831 Obstruction of bile duct: Secondary | ICD-10-CM | POA: Diagnosis not present

## 2024-01-30 LAB — CBC WITH DIFFERENTIAL/PLATELET
Abs Immature Granulocytes: 0.02 10*3/uL (ref 0.00–0.07)
Basophils Absolute: 0 10*3/uL (ref 0.0–0.1)
Basophils Relative: 0 %
Eosinophils Absolute: 0 10*3/uL (ref 0.0–0.5)
Eosinophils Relative: 0 %
HCT: 38.9 % (ref 36.0–46.0)
Hemoglobin: 13.2 g/dL (ref 12.0–15.0)
Immature Granulocytes: 0 %
Lymphocytes Relative: 13 %
Lymphs Abs: 0.9 10*3/uL (ref 0.7–4.0)
MCH: 30.8 pg (ref 26.0–34.0)
MCHC: 33.9 g/dL (ref 30.0–36.0)
MCV: 90.9 fL (ref 80.0–100.0)
Monocytes Absolute: 0.5 10*3/uL (ref 0.1–1.0)
Monocytes Relative: 7 %
Neutro Abs: 5.5 10*3/uL (ref 1.7–7.7)
Neutrophils Relative %: 80 %
Platelets: 106 10*3/uL — ABNORMAL LOW (ref 150–400)
RBC: 4.28 MIL/uL (ref 3.87–5.11)
RDW: 12.9 % (ref 11.5–15.5)
WBC: 6.8 10*3/uL (ref 4.0–10.5)
nRBC: 0 % (ref 0.0–0.2)

## 2024-01-30 LAB — COMPREHENSIVE METABOLIC PANEL
ALT: 109 U/L — ABNORMAL HIGH (ref 0–44)
AST: 32 U/L (ref 15–41)
Albumin: 2.7 g/dL — ABNORMAL LOW (ref 3.5–5.0)
Alkaline Phosphatase: 151 U/L — ABNORMAL HIGH (ref 38–126)
Anion gap: 4 — ABNORMAL LOW (ref 5–15)
BUN: 6 mg/dL — ABNORMAL LOW (ref 8–23)
CO2: 26 mmol/L (ref 22–32)
Calcium: 8.5 mg/dL — ABNORMAL LOW (ref 8.9–10.3)
Chloride: 108 mmol/L (ref 98–111)
Creatinine, Ser: 0.53 mg/dL (ref 0.44–1.00)
GFR, Estimated: >60 mL/min (ref >60)
Glucose, Bld: 86 mg/dL (ref 70–99)
Potassium: 3.8 mmol/L (ref 3.5–5.1)
Sodium: 138 mmol/L (ref 135–145)
Total Bilirubin: 1 mg/dL (ref 0.0–1.2)
Total Protein: 5.3 g/dL — ABNORMAL LOW (ref 6.5–8.1)

## 2024-01-30 LAB — IGG 4: IgG, Subclass 4: 13 mg/dL (ref 2–96)

## 2024-01-30 LAB — PROTIME-INR
INR: 1 (ref 0.8–1.2)
Prothrombin Time: 13.3 s (ref 11.4–15.2)

## 2024-01-30 LAB — MITOCHONDRIAL ANTIBODIES: Mitochondrial M2 Ab, IgG: <20 U (ref 0.0–20.0)

## 2024-01-30 MED ORDER — CHLORHEXIDINE GLUCONATE CLOTH 2 % EX PADS: 6.0000 | MEDICATED_PAD | Freq: Once | CUTANEOUS | Status: AC

## 2024-01-30 MED ORDER — CHLORHEXIDINE GLUCONATE CLOTH 2 % EX PADS
6.0000 | MEDICATED_PAD | Freq: Once | CUTANEOUS | Status: AC
  Administered 2024-01-30: 6 via TOPICAL

## 2024-01-30 NOTE — Plan of Care (Signed)

## 2024-01-30 NOTE — Progress Notes (Addendum)
  Overnight events: None  Subjective: Doing well this morning.  Tolerating liquid diet.  Mild nausea, no emesis.  Mild epigastric abdominal pain.  States she feels a lot better than she did yesterday.  Objective:  Vital signs in last 24 hours: Vitals:   01/29/24 1550 01/29/24 2039 01/30/24 0528 01/30/24 0700  BP: (!) 91/54 113/61 (!) 96/55 116/69  Pulse: 62 70 62 60  Resp: 16 17 16 16   Temp: 98.3 F (36.8 C) 97.9 F (36.6 C) 97.8 F (36.6 C) 98.4 F (36.9 C)  TempSrc: Oral Oral Oral Oral  SpO2: 95% 95% 92% 94%  Weight:      Height:       Physical Exam: Constitutional: Well-appearing, no acute distress, on 3 L nasal cannula Cardio: Regular rate and rhythm, euvolemic Pulm: Clear to auscultation bilaterally Abdomen: Soft, mild epigastric tenderness, no distention Skin: No lesions or skin changes Neuro: Alert and oriented x 3, no focal deficits Psych: Normal mood and affect  Labs: Alk phos 151 AST 32 ALT 109 Total bili 1 CBC with chronic stable thrombocytopenia  Assessment/Plan: Principal Problem:   Common biliary duct stricture Active Problems:   COPD (chronic obstructive pulmonary disease) (HCC)   Thrombocytopenia (HCC)   Cholelithiasis   Pancreatitis, acute   Elevated LFTs  Common biliary duct stricture Pancreatitis, suspect gallstone etiology Cholelithiasis Clinically much improved today, tolerating liquid diet. Normal triglycerides. LFTs improving, bili normal.  Still some residual epigastric pain from pancreatitis.  Pancreatitis most likely due to choledocholithiasis with passed stone, but could also be related to ozempic use. Possible but less likely related to inflammatory biliary stricture seen on MRCP.  - GI following - Surgery consulted for possible cholecystectomy - Advance to full liquid diet - zofran q8h prn - autoimmune workup pending  Chronic conditions: COPD/Chronic hypoxic respiratory failure on 3L [place]: At baseline. Continue home meds and  duonebs PRN. Outpatient PFTs.  Thrombocytopenia: Stable, chronic.  Anxiety/Depression: Zoloft 50 mg daily. Continue Xanax 1 mg nightly.  Insomnia: Trazodone nightly GERD: PPI Obesity: On ozempic  Diet: Normal IVF: None VTE: Enoxaparin Code: DNR-Limited   Dispo: Anticipated discharge to  Home pending medical stability and safe dispo plan.   Annett Fabian, MD 01/30/2024, 10:34 AM Pager: [phone] After 5pm on weekdays and 1pm on weekends: On Call pager [phone]

## 2024-01-30 NOTE — Consult Note (Signed)
 [name] [name] 06-22-1956  [id number].    Requesting MD: Dr. Reymundo Poll Chief Complaint/Reason for Consult: gallstone pancreatitis  HPI:  This is a very pleasant 68 yo female with a history of COPD (up until this admission was on 3L [place] at baseline), GERD, and obesity who has been having intermittent epigastric and chest pain for the last 2 weeks.  She has had about 4-5 episodes of this.  It lasts for a while and then will improve.  This can be random, and she has not noticed any aggravating factors such as diet.  She has some had some nausea and vomiting, but none this time since Sunday.  On Sunday, she was at church and developed this pain again.  It was severe and "worse than childbirth."  She denies any fevers, diarrhea, etc.  She thought she was having an MI so presented to the ED.  She was noted to have a lipase of 798 and elevated LFTs.  She underwent an [place] that reveals cholelithiasis with no complicating factors.  She then had an MRCP which revealed diffuse edema of the peripancreatic area c/w known acute pancreatitis.  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  She is currently improving and tolerating CLD.  We have been asked to see her to consider lap chole.  ROS: ROS: see HPI  Family History  Problem Relation Age of Onset   Breast cancer Mother    Alcohol abuse Mother    Emphysema Mother        smoked   Asthma Mother    Breast cancer Maternal Aunt    Breast cancer Maternal Grandmother     Past Medical History:  Diagnosis Date   Acute hypoxic respiratory failure (HCC) 01/08/2024   Anxiety    Asthma    Chronic, with restrictive component 2/2 obesity   Bacteremia due to Escherichia coli 02/07/2023   COPD (chronic obstructive pulmonary disease) (HCC)    Depression    Family history of breast cancer 01/10/2024   Multiple relatives     GERD (gastroesophageal reflux disease)    Headache    Insomnia    Obesity    On home oxygen therapy    "3L at night only when I get sick" (04/30/2014)   Sepsis due to gram-negative UTI (HCC) 07/17/2023   Shortness of breath dyspnea    Sleep apnea    "couldn't afford mask so I didn't get it" (04/30/2014)    Past Surgical History:  Procedure Laterality Date   APPENDECTOMY  ~ 2002   VAGINAL HYSTERECTOMY  ~ 1978    Social History:  reports that she quit smoking about 55 years ago. Her smoking use included cigarettes. She has never used smokeless tobacco. She reports that she does not drink alcohol and does not use drugs.  Allergies:  Allergies  Allergen Reactions   Ibuprofen Anaphylaxis, Hives and Swelling    Throat swells   Clonazepam Other (See Comments)    Dizziness     Medications Prior to Admission  Medication Sig Dispense Refill   acetaminophen (TYLENOL) 500 MG tablet Take 1,000 mg by mouth every 6 (six) hours as needed (for pain OR back pain).      albuterol (VENTOLIN HFA) 108 (90 Base) MCG/ACT inhaler Inhale 2 puffs into the lungs every 6 (six) hours as needed for wheezing or shortness of breath. 18  g 0   ALPRAZolam (XANAX) 0.5 MG tablet Take 0.5-1 mg by mouth at bedtime.     OZEMPIC, 1 MG/DOSE, 4 MG/3ML SOPN INJECT 1 MG UNDER THE SKIN ONE DAY A WEEK 9 mL 0   pantoprazole (PROTONIX) 40 MG tablet TAKE 1 TABLET(40 MG) BY MOUTH DAILY 30 tablet 11   sertraline (ZOLOFT) 100 MG tablet TAKE 1 TABLET BY MOUTH EVERY DAY 30 tablet 2   traZODone (DESYREL) 150 MG tablet Take 1 tablet (150 mg total) by mouth at bedtime. 90 tablet 1     Physical Exam: Blood pressure 116/69, pulse 60, temperature 98.4 F (36.9 C), temperature source Oral, resp. rate 16, height 5\' 8"  (1.727 m), weight 88.9 kg, SpO2 94%. General: pleasant, WD, WN white female who is laying in bed in NAD HEENT: head is normocephalic, atraumatic.  Sclera are noninjected.  PERRL.  Ears and nose without any masses or  lesions.  Mouth is pink and moist Heart: regular, rate, and rhythm.  Normal s1,s2. No obvious murmurs, gallops, or rubs noted.   Lungs: CTAB, no wheezes, rhonchi, or rales noted.  Respiratory effort nonlabored on RA Abd: soft, mildly tender in her epigastrium, but no guarding, rebounding, or peritonitis, ND, obese, +BS, no masses, hernias, or organomegaly MS: all 4 extremities are symmetrical with no cyanosis, clubbing, or edema. Psych: A&Ox3 with an appropriate affect.   Results for orders placed or performed during the hospital encounter of 01/28/24 (from the past 48 hours)  Basic metabolic panel     Status: Abnormal   Collection Time: 01/28/24  8:18 PM  Result Value Ref Range   Sodium 137 135 - 145 mmol/L   Potassium 3.6 3.5 - 5.1 mmol/L   Chloride 100 98 - 111 mmol/L   CO2 26 22 - 32 mmol/L   Glucose, Bld 122 (H) 70 - 99 mg/dL    Comment: Glucose reference range applies only to samples taken after fasting for at least 8 hours.   BUN 11 8 - 23 mg/dL   Creatinine, Ser 4.09 0.44 - 1.00 mg/dL   Calcium 9.5 8.9 - 81.1 mg/dL   GFR, Estimated >91 >47 mL/min    Comment: (NOTE) Calculated using the CKD-EPI Creatinine Equation (2021)    Anion gap 11 5 - 15    Comment: Performed at [facility], [address], [place], [place] [address]  CBC     Status: Abnormal   Collection Time: 01/28/24  8:18 PM  Result Value Ref Range   WBC 12.3 (H) 4.0 - 10.5 K/uL   RBC 5.30 (H) 3.87 - 5.11 MIL/uL   Hemoglobin 16.3 (H) 12.0 - 15.0 g/dL   HCT 21.3 (H) 08.6 - 57.8 %   MCV 90.9 80.0 - 100.0 fL   MCH 30.8 26.0 - 34.0 pg   MCHC 33.8 30.0 - 36.0 g/dL   RDW 46.9 62.9 - 52.8 %   Platelets 128 (L) 150 - 400 K/uL    Comment: REPEATED TO VERIFY   nRBC 0.0 0.0 - 0.2 %    Comment: Performed at [facility], [address] [place], [place] [address]  Troponin I (High Sensitivity)     Status: None   Collection Time: 01/28/24  8:18 PM  Result Value Ref Range   Troponin I (High Sensitivity) 5  <18 ng/L    Comment: (NOTE) Elevated high sensitivity troponin I (hsTnI) values and significant  changes across serial measurements may suggest ACS but many other  chronic and  acute conditions are known to elevate hsTnI results.  Refer to the "Links" section for chest pain algorithms and additional  guidance. Performed at [facility], [address] [place], [place] [address]   Urinalysis, Routine w reflex microscopic -Urine, Clean Catch     Status: Abnormal   Collection Time: 01/28/24  9:43 PM  Result Value Ref Range   Color, Urine YELLOW YELLOW   APPearance CLEAR CLEAR   Specific Gravity, Urine 1.020 1.005 - 1.030   pH 5.5 5.0 - 8.0   Glucose, UA 100 (A) NEGATIVE mg/dL   Hgb urine dipstick NEGATIVE NEGATIVE   Bilirubin Urine MODERATE (A) NEGATIVE   Ketones, ur NEGATIVE NEGATIVE mg/dL   Protein, ur NEGATIVE NEGATIVE mg/dL   Nitrite POSITIVE (A) NEGATIVE   Leukocytes,Ua NEGATIVE NEGATIVE    Comment: Performed at [facility], [address], [place], [place] [address]  Urinalysis, Microscopic (reflex)     Status: Abnormal   Collection Time: 01/28/24  9:43 PM  Result Value Ref Range   RBC / HPF 0-5 0 - 5 RBC/hpf   WBC, UA 0-5 0 - 5 WBC/hpf   Bacteria, UA MANY (A) NONE SEEN   Squamous Epithelial / HPF 0-5 0 - 5 /HPF    Comment: Performed at [facility], [address] [place], [place] [address]  Resp panel by RT-PCR (RSV, Flu A&B, Covid) Anterior Nasal Swab     Status: None   Collection Time: 01/28/24 10:21 PM   Specimen: Anterior Nasal Swab  Result Value Ref Range   SARS Coronavirus 2 by RT PCR NEGATIVE NEGATIVE   Influenza A by PCR NEGATIVE NEGATIVE   Influenza B by PCR NEGATIVE NEGATIVE    Comment: (NOTE) The Xpert Xpress SARS-CoV-2/FLU/RSV plus assay is intended as an aid in the diagnosis of influenza from Nasopharyngeal swab specimens and should not be used as a sole basis for treatment. Nasal washings and aspirates are unacceptable for Xpert  Xpress SARS-CoV-2/FLU/RSV testing.  Fact Sheet for Patients: BloggerCourse.com  Fact Sheet for Healthcare Providers: SeriousBroker.it  This test is not yet approved or cleared by the [place] FDA and has been authorized for detection and/or diagnosis of SARS-CoV-2 by FDA under an Emergency Use Authorization (EUA). This EUA will remain in effect (meaning this test can be used) for the duration of the COVID-19 declaration under Section 564(b)(1) of the Act, 21 U.S.C. section 360bbb-3(b)(1), unless the authorization is terminated or revoked.     Resp Syncytial Virus by PCR NEGATIVE NEGATIVE    Comment: (NOTE) Fact Sheet for Patients: BloggerCourse.com  Fact Sheet for Healthcare Providers: SeriousBroker.it  This test is not yet approved or cleared by the [place] FDA and has been authorized for detection and/or diagnosis of SARS-CoV-2 by FDA under an Emergency Use Authorization (EUA). This EUA will remain in effect (meaning this test can be used) for the duration of the COVID-19 declaration under Section 564(b)(1) of the Act, 21 U.S.C. section 360bbb-3(b)(1), unless the authorization is terminated or revoked.  Performed at [facility], [address], [place], [place] [address]   Troponin I (High Sensitivity)     Status: None   Collection Time: 01/28/24 11:05 PM  Result Value Ref Range   Troponin I (High Sensitivity) 5 <18 ng/L    Comment: (NOTE) Elevated high sensitivity troponin I (hsTnI) values and significant  changes across serial measurements may suggest ACS but many other  chronic and acute conditions are known to  elevate hsTnI results.  Refer to the "Links" section for chest pain algorithms and additional  guidance. Performed at [facility], [address], [place], [place] [address]   Hepatic function panel     Status: Abnormal   Collection  Time: 01/28/24 11:05 PM  Result Value Ref Range   Total Protein 6.0 (L) 6.5 - 8.1 g/dL   Albumin 3.0 (L) 3.5 - 5.0 g/dL   AST 528 (H) 15 - 41 U/L   ALT 190 (H) 0 - 44 U/L   Alkaline Phosphatase 217 (H) 38 - 126 U/L   Total Bilirubin 3.9 (H) 0.0 - 1.2 mg/dL   Bilirubin, Direct 2.5 (H) 0.0 - 0.2 mg/dL   Indirect Bilirubin 1.4 (H) 0.3 - 0.9 mg/dL    Comment: Performed at [facility], [address], [place], [place] [address]  Lipase, blood     Status: Abnormal   Collection Time: 01/28/24 11:05 PM  Result Value Ref Range   Lipase 798 (H) 11 - 51 U/L    Comment: RESULT CONFIRMED BY MANUAL DILUTION Performed at [facility], [address], [place], [place] [address]   Hepatitis panel, acute     Status: None   Collection Time: 01/29/24  5:58 AM  Result Value Ref Range   Hepatitis B Surface Ag NON REACTIVE NON REACTIVE   HCV Ab NON REACTIVE NON REACTIVE    Comment: (NOTE) Nonreactive HCV antibody screen is consistent with no HCV infections,  unless recent infection is suspected or other evidence exists to indicate HCV infection.     Hep A IgM NON REACTIVE NON REACTIVE   Hep B C IgM NON REACTIVE NON REACTIVE    Comment: Performed at [facility], [address], [place], [place] [address]  Comprehensive metabolic panel     Status: Abnormal   Collection Time: 01/29/24  5:58 AM  Result Value Ref Range   Sodium 136 135 - 145 mmol/L   Potassium 3.8 3.5 - 5.1 mmol/L   Chloride 100 98 - 111 mmol/L   CO2 25 22 - 32 mmol/L   Glucose, Bld 147 (H) 70 - 99 mg/dL    Comment: Glucose reference range applies only to samples taken after fasting for at least 8 hours.   BUN 7 (L) 8 - 23 mg/dL   Creatinine, Ser 6.44 0.44 - 1.00 mg/dL   Calcium 9.0 8.9 - 03.4 mg/dL   Total Protein 6.3 (L) 6.5 - 8.1 g/dL   Albumin 3.1 (L) 3.5 - 5.0 g/dL   AST 742 (H) 15 - 41 U/L   ALT 190 (H) 0 - 44 U/L   Alkaline Phosphatase 208 (H) 38 - 126 U/L   Total Bilirubin 1.5 (H) 0.0 - 1.2 mg/dL    GFR, Estimated >59 >56 mL/min    Comment: (NOTE) Calculated using the CKD-EPI Creatinine Equation (2021)    Anion gap 11 5 - 15    Comment: Performed at [facility], [address] [place], [place] [address]  CBC     Status: Abnormal   Collection Time: 01/29/24  5:58 AM  Result Value Ref Range   WBC 10.3 4.0 - 10.5 K/uL   RBC 4.75 3.87 - 5.11 MIL/uL   Hemoglobin 14.8 12.0 - 15.0 g/dL   HCT 43.3 29.5 - 18.8 %   MCV 88.4 80.0 - 100.0 fL   MCH 31.2 26.0 - 34.0 pg   MCHC 35.2 30.0 - 36.0 g/dL  RDW 12.7 11.5 - 15.5 %   Platelets 118 (L) 150 - 400 K/uL    Comment: REPEATED TO VERIFY   nRBC 0.0 0.0 - 0.2 %    Comment: Performed at [facility], [address] [place], [place] [address]  Lipid panel     Status: None   Collection Time: 01/29/24 11:47 AM  Result Value Ref Range   Cholesterol 150 0 - 200 mg/dL   Triglycerides 55 <284 mg/dL   HDL 56 >13 mg/dL   Total CHOL/HDL Ratio 2.7 RATIO   VLDL 11 0 - 40 mg/dL   LDL Cholesterol 83 0 - 99 mg/dL    Comment:        Total Cholesterol/HDL:CHD Risk Coronary Heart Disease Risk Table                     Men   Women  1/2 Average Risk   3.4   3.3  Average Risk       5.0   4.4  2 X Average Risk   9.6   7.1  3 X Average Risk  23.4   11.0        Use the calculated Patient Ratio above and the CHD Risk Table to determine the patient's CHD Risk.        ATP III CLASSIFICATION (LDL):  <100     mg/dL   Optimal  244-010  mg/dL   Near or Above                    Optimal  130-159  mg/dL   Borderline  272-536  mg/dL   High  >644     mg/dL   Very High Performed at [facility], [address] [place], [place] [address]   Comprehensive metabolic panel     Status: Abnormal   Collection Time: 01/30/24  5:25 AM  Result Value Ref Range   Sodium 138 135 - 145 mmol/L   Potassium 3.8 3.5 - 5.1 mmol/L   Chloride 108 98 - 111 mmol/L   CO2 26 22 - 32 mmol/L   Glucose, Bld 86 70 - 99 mg/dL    Comment: Glucose reference range  applies only to samples taken after fasting for at least 8 hours.   BUN 6 (L) 8 - 23 mg/dL   Creatinine, Ser 2.59 0.44 - 1.00 mg/dL   Calcium 8.5 (L) 8.9 - 10.3 mg/dL   Total Protein 5.3 (L) 6.5 - 8.1 g/dL   Albumin 2.7 (L) 3.5 - 5.0 g/dL   AST 32 15 - 41 U/L   ALT 109 (H) 0 - 44 U/L   Alkaline Phosphatase 151 (H) 38 - 126 U/L   Total Bilirubin 1.0 0.0 - 1.2 mg/dL   GFR, Estimated >56 >38 mL/min    Comment: (NOTE) Calculated using the CKD-EPI Creatinine Equation (2021)    Anion gap 4 (L) 5 - 15    Comment: Performed at [facility], [address] [place], [place] [address]  Protime-INR     Status: None   Collection Time: 01/30/24  5:25 AM  Result Value Ref Range   Prothrombin Time 13.3 11.4 - 15.2 seconds   INR 1.0 0.8 - 1.2    Comment: (NOTE) INR goal varies based on device and disease states. Performed at [facility], [address], [place], [place] [address]   CBC with Differential/Platelet     Status: Abnormal  Collection Time: 01/30/24  9:17 AM  Result Value Ref Range   WBC 6.8 4.0 - 10.5 K/uL   RBC 4.28 3.87 - 5.11 MIL/uL   Hemoglobin 13.2 12.0 - 15.0 g/dL   HCT 16.1 09.6 - 04.5 %   MCV 90.9 80.0 - 100.0 fL   MCH 30.8 26.0 - 34.0 pg   MCHC 33.9 30.0 - 36.0 g/dL   RDW 40.9 81.1 - 91.4 %   Platelets 106 (L) 150 - 400 K/uL    Comment: REPEATED TO VERIFY   nRBC 0.0 0.0 - 0.2 %   Neutrophils Relative % 80 %   Neutro Abs 5.5 1.7 - 7.7 K/uL   Lymphocytes Relative 13 %   Lymphs Abs 0.9 0.7 - 4.0 K/uL   Monocytes Relative 7 %   Monocytes Absolute 0.5 0.1 - 1.0 K/uL   Eosinophils Relative 0 %   Eosinophils Absolute 0.0 0.0 - 0.5 K/uL   Basophils Relative 0 %   Basophils Absolute 0.0 0.0 - 0.1 K/uL   Immature Granulocytes 0 %   Abs Immature Granulocytes 0.02 0.00 - 0.07 K/uL    Comment: Performed at [facility], [address], [place], [place] [address]   MR ABDOMEN MRCP W WO CONTAST Result Date: 01/29/2024 CLINICAL DATA:  Transaminitis.  EXAM: MRI ABDOMEN WITHOUT AND WITH CONTRAST (INCLUDING MRCP) TECHNIQUE: Multiplanar multisequence MR imaging of the abdomen was performed both before and after the administration of intravenous contrast. Heavily T2-weighted images of the biliary and pancreatic ducts were obtained, and three-dimensional MRCP images were rendered by post processing. CONTRAST:  10mL GADAVIST GADOBUTROL 1 MMOL/ML IV SOLN COMPARISON:  None Available. FINDINGS: Lower chest: Scar versus subsegmental atelectasis noted in the left base. Hepatobiliary: Small hemangioma is identified within the lateral segment of left lobe measuring 9 mm, image 14/5. Tiny stone within the dependent portion of the gallbladder measures 3 mm. No gallbladder wall thickening or pericholecystic inflammation. The common bile duct measures 1.1 cm in maximum diameter. No significant intrahepatic bile duct dilatation. Abrupt narrowing of the distal CBD is identified just before the ampulla, image 17/11. No choledocholithiasis. Pancreas: There is diffuse edema and mild peripancreatic soft tissue stranding involving the pancreas. No pancreatic mass or main duct dilatation. No signs of pancreatic necrosis or pseudocyst. Spleen: Measures 13.4 cm in cranial caudal dimension. No focal splenic abnormality. Adrenals/Urinary Tract: Left adrenal gland adenoma measures 0.9 cm, image 29/8. Right adrenal gland adenoma measures 1.3 cm, image 31/8. No follow-up imaging recommended. No kidney mass or obstructive uropathy. Stomach/Bowel: Stomach appears normal. No dilated loops of large or small bowel. Vascular/Lymphatic: Aortic atherosclerosis. No aneurysm. No adenopathy. Other:  No free fluid or fluid collections. Musculoskeletal: No suspicious bone lesions identified. IMPRESSION: 1. Diffuse edema and mild peripancreatic soft tissue stranding involving the pancreas compatible with acute pancreatitis. No signs of pancreatic necrosis or pseudocyst. 2. Cholelithiasis without signs of  acute cholecystitis. 3. The common bile duct measures 1.1 cm in maximum diameter. No significant intrahepatic bile duct dilatation. Abrupt narrowing of the distal CBD is identified just before the ampulla. No choledocholithiasis. Findings may reflect a inflammatory stricture. Consider further evaluation with ERCP. 4. Bilateral adrenal gland adenomas. No follow-up imaging recommended. 5. Aortic Atherosclerosis (ICD10-I70.0). Electronically Signed   By: Signa Kell M.D.   On: 01/29/2024 06:13   US Abdomen Limited RUQ (LIVER/GB) Result Date: 01/29/2024 CLINICAL DATA:  Elevated LFTs EXAM: ULTRASOUND ABDOMEN LIMITED RIGHT UPPER QUADRANT COMPARISON:  None Available. FINDINGS: Gallbladder: Gallbladder is well  distended with single gallstone. No wall thickening or pericholecystic fluid is noted. Common bile duct: Diameter: 4.4 mm. Liver: Diffusely increased in echogenicity without focal mass. Portal vein is patent on color Doppler imaging with normal direction of blood flow towards the liver. Other: None. IMPRESSION: Cholelithiasis without complicating factors. Fatty liver. Electronically Signed   By: Alcide Clever M.D.   On: 01/29/2024 01:53   DG Chest 2 View Result Date: 01/28/2024 CLINICAL DATA:  Chest pain EXAM: CHEST - 2 VIEW COMPARISON:  01/08/2024 FINDINGS: Melene Muller is within normal limits. The lungs are well aerated bilaterally. No focal infiltrate or effusion is seen. No bony abnormality is noted. IMPRESSION: No active cardiopulmonary disease. Electronically Signed   By: Alcide Clever M.D.   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  She appears to have gallstone pancreatitis and likely been having episodes of this for the last several weeks.  I have discussed her MRCP with GI who feels like stricture is likely secondary to inflammation from her  pancreatitis and does not recommend further evaluation initially with ERCP.  They recommend proceeding with lap chole with IOC.  Her pain is overall improving and her LFTs down trending (TB now normal).  She is only mildly tender in her epigastrium.  If this continues to improve, she may likely be ready for OR tomorrow.  Of note, she has been on 3L [place] at home for COPD/chronic respiratory failure, but has been taken off of this while here.  She is currently doing well on RA.   FEN - CLD, NPO p MN VTE - ok for chemical prophylaxis from our standpoint ID - none currently needed  COPD GERD Depression  Obesity   I reviewed Consultant GI notes, hospitalist notes, last 24 h vitals and pain scores, last 48 h intake and output, last 24 h labs and trends, and last 24 h imaging results.  Letha Cape, [facility] 01/30/2024, 11:11 AM Please see Amion for pager number during day hours 7:00am-4:30pm or 7:00am -11:30am on weekends

## 2024-01-30 NOTE — Progress Notes (Addendum)
 Patient ID: [name] [name], female   DOB: November 08, 1955, 68 y.o.   MRN: [id number]     Attending physician's note   I have taken a history, reviewed the chart, and examined the patient. I performed a substantive portion of this encounter, including complete performance of at least one of the key components, in conjunction with the APP. I agree with the APP's note, impression, and recommendations with my edits.   Liver enzymes downtrending and T. bili now normal at 1.0.  No plan for ERCP at this time.  Plan is for cholecystectomy with IOC tomorrow.  GI service will remain available as needed pending IOC findings.  Chelci Wintermute, DO, FACG [phone] office          Progress Note   Subjective   Day # 2 CC; acute abdominal pain, nausea vomiting elevated LFTs  Labs today-WBC 6.8/hemoglobin 13.2/hematocrit 38.9 INr 1.0  T. bili 1.0/alk phos 151/AST 32/ALT 109 improved  Patient sitting up in the chair, says she feels much better today she is hungry and would like to try something more than liquids, no nausea today- still having some mild abdominal pain   Objective   Vital signs in last 24 hours: Temp:  [97.8 F (36.6 C)-98.4 F (36.9 C)] 98.4 F (36.9 C) (03/25 0700) Pulse Rate:  [60-70] 60 (03/25 0700) Resp:  [16-17] 16 (03/25 0700) BP: (91-116)/(54-69) 116/69 (03/25 0700) SpO2:  [92 %-95 %] 94 % (03/25 0700) Last BM Date : 01/27/24 General:   Older white female in NAD Heart:  Regular rate and rhythm; no murmurs Lungs: Respirations even and unlabored, lungs CTA bilaterally Abdomen:  Soft, obese, she does have mild tenderness across the epigastrium and hypogastrium no guarding or rebound. nondistended. Normal bowel sounds. Extremities:  Without edema. Neurologic:  Alert and oriented,  grossly normal neurologically. Psych:  Cooperative. Normal mood and affect.  Intake/Output from previous day: 03/24 0701 - 03/25 0700 In: 1338.6 [P.O.:360; I.V.:978.6] Out: -   Intake/Output this shift: No intake/output data recorded.  Lab Results: Recent Labs    01/28/24 2018 01/29/24 0558 01/30/24 0917  WBC 12.3* 10.3 6.8  HGB 16.3* 14.8 13.2  HCT 48.2* 42.0 38.9  PLT 128* 118* 106*   BMET Recent Labs    01/28/24 2018 01/29/24 0558 01/30/24 0525  NA 137 136 138  K 3.6 3.8 3.8  CL 100 100 108  CO2 26 25 26   GLUCOSE 122* 147* 86  BUN 11 7* 6*  CREATININE 0.65 0.63 0.53  CALCIUM 9.5 9.0 8.5*   LFT Recent Labs    01/28/24 2305 01/29/24 0558 01/30/24 0525  PROT 6.0*   < > 5.3*  ALBUMIN 3.0*   < > 2.7*  AST 192*   < > 32  ALT 190*   < > 109*  ALKPHOS 217*   < > 151*  BILITOT 3.9*   < > 1.0  BILIDIR 2.5*  --   --   IBILI 1.4*  --   --    < > = values in this interval not displayed.   PT/INR Recent Labs    01/30/24 0525  LABPROT 13.3  INR 1.0    Studies/Results: MR ABDOMEN MRCP W WO CONTAST Result Date: 01/29/2024 CLINICAL DATA:  Transaminitis. EXAM: MRI ABDOMEN WITHOUT AND WITH CONTRAST (INCLUDING MRCP) TECHNIQUE: Multiplanar multisequence MR imaging of the abdomen was performed both before and after the administration of intravenous contrast. Heavily T2-weighted images of the biliary and pancreatic ducts were obtained, and three-dimensional  MRCP images were rendered by post processing. CONTRAST:  10mL GADAVIST GADOBUTROL 1 MMOL/ML IV SOLN COMPARISON:  None Available. FINDINGS: Lower chest: Scar versus subsegmental atelectasis noted in the left base. Hepatobiliary: Small hemangioma is identified within the lateral segment of left lobe measuring 9 mm, image 14/5. Tiny stone within the dependent portion of the gallbladder measures 3 mm. No gallbladder wall thickening or pericholecystic inflammation. The common bile duct measures 1.1 cm in maximum diameter. No significant intrahepatic bile duct dilatation. Abrupt narrowing of the distal CBD is identified just before the ampulla, image 17/11. No choledocholithiasis. Pancreas: There is diffuse  edema and mild peripancreatic soft tissue stranding involving the pancreas. No pancreatic mass or main duct dilatation. No signs of pancreatic necrosis or pseudocyst. Spleen: Measures 13.4 cm in cranial caudal dimension. No focal splenic abnormality. Adrenals/Urinary Tract: Left adrenal gland adenoma measures 0.9 cm, image 29/8. Right adrenal gland adenoma measures 1.3 cm, image 31/8. No follow-up imaging recommended. No kidney mass or obstructive uropathy. Stomach/Bowel: Stomach appears normal. No dilated loops of large or small bowel. Vascular/Lymphatic: Aortic atherosclerosis. No aneurysm. No adenopathy. Other:  No free fluid or fluid collections. Musculoskeletal: No suspicious bone lesions identified. IMPRESSION: 1. Diffuse edema and mild peripancreatic soft tissue stranding involving the pancreas compatible with acute pancreatitis. No signs of pancreatic necrosis or pseudocyst. 2. Cholelithiasis without signs of acute cholecystitis. 3. The common bile duct measures 1.1 cm in maximum diameter. No significant intrahepatic bile duct dilatation. Abrupt narrowing of the distal CBD is identified just before the ampulla. No choledocholithiasis. Findings may reflect a inflammatory stricture. Consider further evaluation with ERCP. 4. Bilateral adrenal gland adenomas. No follow-up imaging recommended. 5. Aortic Atherosclerosis (ICD10-I70.0). Electronically Signed   By: Signa Kell M.D.   On: 01/29/2024 06:13   US Abdomen Limited RUQ (LIVER/GB) Result Date: 01/29/2024 CLINICAL DATA:  Elevated LFTs EXAM: ULTRASOUND ABDOMEN LIMITED RIGHT UPPER QUADRANT COMPARISON:  None Available. FINDINGS: Gallbladder: Gallbladder is well distended with single gallstone. No wall thickening or pericholecystic fluid is noted. Common bile duct: Diameter: 4.4 mm. Liver: Diffusely increased in echogenicity without focal mass. Portal vein is patent on color Doppler imaging with normal direction of blood flow towards the liver. Other: None.  IMPRESSION: Cholelithiasis without complicating factors. Fatty liver. Electronically Signed   By: Alcide Clever M.D.   On: 01/29/2024 01:53   DG Chest 2 View Result Date: 01/28/2024 CLINICAL DATA:  Chest pain EXAM: CHEST - 2 VIEW COMPARISON:  01/08/2024 FINDINGS: Melene Muller is within normal limits. The lungs are well aerated bilaterally. No focal infiltrate or effusion is seen. No bony abnormality is noted. IMPRESSION: No active cardiopulmonary disease. Electronically Signed   By: Alcide Clever M.D.   On: 01/28/2024 20:42       Assessment / Plan:    #67 68 year old female with history of COPD, had been on O2 at 3 L on admission after a recent hospitalization with influenza.  Apparently O2 has been discontinued today  Admitted with episodic abdominal pain nausea and vomiting over the past week, and found to have elevated LFTs with initial T. bili of 3.9/alk phos 317/lipase 798  MRI/MRCP shows evidence of mild pancreatitis, solitary stone in the gallbladder without definite gallbladder wall thickening and a dilated CBD at 1.1 cm with abrupt cut off just above the ampulla more concerning for inflammatory stricture, no choledocholithiasis.  Patient had been on Ozempic over the past year-known association with development of cholelithiasis and pancreatitis. Possible inflammatory stricture may be associated with acute  pancreatitis. Clinically still most likely that she has passed a stone.  Symptoms improved since admission no further episodes of severe pain LFTs much improved  Surgery has evaluated this morning  #2 history of morbid obesity-on Ozempic 100 pound weight loss over the past year #3 thrombocytopenia #4.  Hepatic steatosis  Plan; will advance to full liquid diet today, n.p.o. past midnight No plans for ERCP at this point from GI perspective. Surgery is evaluating today for laparoscopic cholecystectomy, and hopefully can have IOC at that time.  Would reserve ERCP and do only if positive  IOC  Patient understands current plan, all questions answered.      Principal Problem:   Common biliary duct stricture Active Problems:   COPD (chronic obstructive pulmonary disease) (HCC)   Thrombocytopenia (HCC)   Cholelithiasis   Pancreatitis, acute   Elevated LFTs     LOS: 1 day   Amy Esterwood PA-C 01/30/2024, 10:53 AM

## 2024-01-31 ENCOUNTER — Other Ambulatory Visit: Payer: Self-pay

## 2024-01-31 ENCOUNTER — Encounter (HOSPITAL_COMMUNITY)
Admission: EM | Disposition: A | Discharge status: Home Health | Payer: Self-pay | Source: Home / Self Care | Attending: Internal Medicine

## 2024-01-31 ENCOUNTER — Inpatient Hospital Stay (HOSPITAL_COMMUNITY): Admitting: Anesthesiology

## 2024-01-31 ENCOUNTER — Encounter (HOSPITAL_COMMUNITY): Payer: Self-pay | Admitting: Internal Medicine

## 2024-01-31 ENCOUNTER — Inpatient Hospital Stay (HOSPITAL_COMMUNITY)

## 2024-01-31 DIAGNOSIS — K819 Cholecystitis, unspecified: Secondary | ICD-10-CM

## 2024-01-31 DIAGNOSIS — J449 Chronic obstructive pulmonary disease, unspecified: Secondary | ICD-10-CM | POA: Diagnosis not present

## 2024-01-31 DIAGNOSIS — G4733 Obstructive sleep apnea (adult) (pediatric): Secondary | ICD-10-CM | POA: Diagnosis not present

## 2024-01-31 DIAGNOSIS — I700 Atherosclerosis of aorta: Secondary | ICD-10-CM | POA: Diagnosis not present

## 2024-01-31 HISTORY — PX: CHOLECYSTECTOMY: SHX55

## 2024-01-31 LAB — COMPREHENSIVE METABOLIC PANEL
ALT: 79 U/L — ABNORMAL HIGH (ref 0–44)
AST: 15 U/L (ref 15–41)
Albumin: 2.7 g/dL — ABNORMAL LOW (ref 3.5–5.0)
Alkaline Phosphatase: 140 U/L — ABNORMAL HIGH (ref 38–126)
Anion gap: 7 (ref 5–15)
BUN: <5 mg/dL — ABNORMAL LOW (ref 8–23)
CO2: 29 mmol/L (ref 22–32)
Calcium: 9.3 mg/dL (ref 8.9–10.3)
Chloride: 106 mmol/L (ref 98–111)
Creatinine, Ser: 0.57 mg/dL (ref 0.44–1.00)
GFR, Estimated: >60 mL/min (ref >60)
Glucose, Bld: 84 mg/dL (ref 70–99)
Potassium: 4.4 mmol/L (ref 3.5–5.1)
Sodium: 142 mmol/L (ref 135–145)
Total Bilirubin: 0.8 mg/dL (ref 0.0–1.2)
Total Protein: 5.6 g/dL — ABNORMAL LOW (ref 6.5–8.1)

## 2024-01-31 LAB — CBC
HCT: 39.6 % (ref 36.0–46.0)
Hemoglobin: 13.7 g/dL (ref 12.0–15.0)
MCH: 31.4 pg (ref 26.0–34.0)
MCHC: 34.6 g/dL (ref 30.0–36.0)
MCV: 90.8 fL (ref 80.0–100.0)
Platelets: 112 10*3/uL — ABNORMAL LOW (ref 150–400)
RBC: 4.36 MIL/uL (ref 3.87–5.11)
RDW: 12.9 % (ref 11.5–15.5)
WBC: 5.3 10*3/uL (ref 4.0–10.5)
nRBC: 0 % (ref 0.0–0.2)

## 2024-01-31 LAB — IGG: IgG (Immunoglobin G), Serum: 691 mg/dL (ref 586–1602)

## 2024-01-31 SURGERY — LAPAROSCOPIC CHOLECYSTECTOMY WITH INTRAOPERATIVE CHOLANGIOGRAM
Anesthesia: General

## 2024-01-31 MED ORDER — ONDANSETRON HCL 4 MG/2ML IJ SOLN
INTRAMUSCULAR | Status: DC | PRN
  Administered 2024-01-31: 4 mg via INTRAVENOUS

## 2024-01-31 MED ORDER — ACETAMINOPHEN 500 MG PO TABS
ORAL_TABLET | ORAL | Status: AC
  Administered 2024-01-31: 1000 mg via ORAL
  Filled 2024-01-31: qty 2

## 2024-01-31 MED ORDER — LACTATED RINGERS IV SOLN: INTRAVENOUS | Status: DC

## 2024-01-31 MED ORDER — OXYCODONE HCL 5 MG PO TABS
ORAL_TABLET | ORAL | Status: AC
  Filled 2024-01-31: qty 1

## 2024-01-31 MED ORDER — DEXMEDETOMIDINE HCL IN NACL 80 MCG/20ML IV SOLN
INTRAVENOUS | Status: DC | PRN
  Administered 2024-01-31 (×2): 8 ug via INTRAVENOUS
  Administered 2024-01-31: 4 ug via INTRAVENOUS

## 2024-01-31 MED ORDER — LIDOCAINE 2% (20 MG/ML) 5 ML SYRINGE
INTRAMUSCULAR | Status: DC | PRN
  Administered 2024-01-31: 60 mg via INTRAVENOUS

## 2024-01-31 MED ORDER — BUPIVACAINE-EPINEPHRINE 0.25% -1:200000 IJ SOLN
INTRAMUSCULAR | Status: DC | PRN
  Administered 2024-01-31: 20 mL

## 2024-01-31 MED ORDER — FENTANYL CITRATE (PF) 100 MCG/2ML IJ SOLN
INTRAMUSCULAR | Status: AC
  Filled 2024-01-31: qty 2

## 2024-01-31 MED ORDER — OXYCODONE HCL 5 MG/5ML PO SOLN: 5.0000 mg | Freq: Once | ORAL | Status: AC | PRN

## 2024-01-31 MED ORDER — ONDANSETRON HCL 4 MG/2ML IJ SOLN: 4.0000 mg | Freq: Once | INTRAMUSCULAR | Status: DC | PRN

## 2024-01-31 MED ORDER — SODIUM CHLORIDE 0.9 % IV SOLN
INTRAVENOUS | Status: DC | PRN
  Administered 2024-01-31: 30 mL

## 2024-01-31 MED ORDER — 0.9 % SODIUM CHLORIDE (POUR BTL) OPTIME
TOPICAL | Status: DC | PRN
  Administered 2024-01-31: 1000 mL

## 2024-01-31 MED ORDER — ROCURONIUM BROMIDE 10 MG/ML (PF) SYRINGE
PREFILLED_SYRINGE | INTRAVENOUS | Status: AC
  Filled 2024-01-31: qty 10

## 2024-01-31 MED ORDER — HYDROMORPHONE HCL 1 MG/ML IJ SOLN
INTRAMUSCULAR | Status: AC
  Filled 2024-01-31: qty 1

## 2024-01-31 MED ORDER — FENTANYL CITRATE (PF) 100 MCG/2ML IJ SOLN
INTRAMUSCULAR | Status: DC | PRN
Start: 2024-01-31 — End: 2024-01-31
  Administered 2024-01-31 (×2): 50 ug via INTRAVENOUS

## 2024-01-31 MED ORDER — SODIUM CHLORIDE 0.9 % IR SOLN
Status: DC | PRN
  Administered 2024-01-31: 1000 mL

## 2024-01-31 MED ORDER — ACETAMINOPHEN 10 MG/ML IV SOLN: 1000.0000 mg | Freq: Once | INTRAVENOUS | Status: DC | PRN

## 2024-01-31 MED ORDER — CHLORHEXIDINE GLUCONATE 0.12 % MT SOLN: 15.0000 mL | Freq: Once | OROMUCOSAL | Status: AC

## 2024-01-31 MED ORDER — SUGAMMADEX SODIUM 200 MG/2ML IV SOLN
INTRAVENOUS | Status: DC | PRN
  Administered 2024-01-31: 200 mg via INTRAVENOUS

## 2024-01-31 MED ORDER — LIDOCAINE 2% (20 MG/ML) 5 ML SYRINGE
INTRAMUSCULAR | Status: AC
  Filled 2024-01-31: qty 5

## 2024-01-31 MED ORDER — GABAPENTIN 300 MG PO CAPS
ORAL_CAPSULE | ORAL | Status: AC
  Filled 2024-01-31: qty 1

## 2024-01-31 MED ORDER — AMISULPRIDE (ANTIEMETIC) 5 MG/2ML IV SOLN: 10.0000 mg | Freq: Once | INTRAVENOUS | Status: DC | PRN

## 2024-01-31 MED ORDER — PROPOFOL 10 MG/ML IV BOLUS
INTRAVENOUS | Status: DC | PRN
  Administered 2024-01-31: 120 mg via INTRAVENOUS

## 2024-01-31 MED ORDER — ORAL CARE MOUTH RINSE: 15.0000 mL | Freq: Once | OROMUCOSAL | Status: AC

## 2024-01-31 MED ORDER — CHLORHEXIDINE GLUCONATE 0.12 % MT SOLN
OROMUCOSAL | Status: AC
Start: 2024-01-31 — End: 2024-01-31
  Administered 2024-01-31: 15 mL via OROMUCOSAL
  Filled 2024-01-31: qty 15

## 2024-01-31 MED ORDER — HYDROMORPHONE HCL 1 MG/ML IJ SOLN
0.2500 mg | INTRAMUSCULAR | Status: DC | PRN
  Administered 2024-01-31 (×4): 0.5 mg via INTRAVENOUS

## 2024-01-31 MED ORDER — FENTANYL CITRATE (PF) 250 MCG/5ML IJ SOLN
INTRAMUSCULAR | Status: AC
  Filled 2024-01-31: qty 5

## 2024-01-31 MED ORDER — DEXAMETHASONE SODIUM PHOSPHATE 10 MG/ML IJ SOLN
INTRAMUSCULAR | Status: AC
  Filled 2024-01-31: qty 1

## 2024-01-31 MED ORDER — ACETAMINOPHEN 500 MG PO TABS
1000.0000 mg | ORAL_TABLET | ORAL | Status: AC
Start: 2024-01-31 — End: 2024-01-31

## 2024-01-31 MED ORDER — DEXMEDETOMIDINE HCL IN NACL 80 MCG/20ML IV SOLN
INTRAVENOUS | Status: AC
  Filled 2024-01-31: qty 20

## 2024-01-31 MED ORDER — GABAPENTIN 300 MG PO CAPS: 300.0000 mg | ORAL_CAPSULE | ORAL | Status: DC

## 2024-01-31 MED ORDER — CEFAZOLIN SODIUM-DEXTROSE 2-4 GM/100ML-% IV SOLN
INTRAVENOUS | Status: AC
  Filled 2024-01-31: qty 100

## 2024-01-31 MED ORDER — OXYCODONE HCL 5 MG PO TABS
5.0000 mg | ORAL_TABLET | Freq: Once | ORAL | Status: AC | PRN
  Administered 2024-01-31: 5 mg via ORAL

## 2024-01-31 MED ORDER — FENTANYL CITRATE (PF) 250 MCG/5ML IJ SOLN
INTRAMUSCULAR | Status: DC | PRN
  Administered 2024-01-31 (×4): 50 ug via INTRAVENOUS

## 2024-01-31 MED ORDER — PHENYLEPHRINE 80 MCG/ML (10ML) SYRINGE FOR IV PUSH (FOR BLOOD PRESSURE SUPPORT)
PREFILLED_SYRINGE | INTRAVENOUS | Status: AC
  Filled 2024-01-31: qty 10

## 2024-01-31 MED ORDER — CEFAZOLIN SODIUM-DEXTROSE 2-4 GM/100ML-% IV SOLN
2.0000 g | INTRAVENOUS | Status: AC
  Administered 2024-01-31: 2 g via INTRAVENOUS

## 2024-01-31 MED ORDER — DEXAMETHASONE SODIUM PHOSPHATE 10 MG/ML IJ SOLN
INTRAMUSCULAR | Status: DC | PRN
  Administered 2024-01-31: 5 mg via INTRAVENOUS

## 2024-01-31 MED ORDER — ONDANSETRON HCL 4 MG/2ML IJ SOLN
INTRAMUSCULAR | Status: AC
  Filled 2024-01-31: qty 2

## 2024-01-31 MED ORDER — ROCURONIUM BROMIDE 10 MG/ML (PF) SYRINGE
PREFILLED_SYRINGE | INTRAVENOUS | Status: DC | PRN
  Administered 2024-01-31: 50 mg via INTRAVENOUS

## 2024-01-31 MED ORDER — HYDROMORPHONE HCL 1 MG/ML IJ SOLN
INTRAMUSCULAR | Status: AC
Start: 2024-01-31 — End: 2024-02-01
  Filled 2024-01-31: qty 1

## 2024-01-31 MED ORDER — BUPIVACAINE-EPINEPHRINE (PF) 0.25% -1:200000 IJ SOLN
INTRAMUSCULAR | Status: AC
  Filled 2024-01-31: qty 30

## 2024-01-31 MED ORDER — INDOCYANINE GREEN 25 MG IV SOLR
2.5000 mg | Freq: Once | INTRAVENOUS | Status: AC
  Administered 2024-01-31: 2.5 mg via INTRAVENOUS
  Filled 2024-01-31: qty 10

## 2024-01-31 MED ORDER — ACETAMINOPHEN 10 MG/ML IV SOLN
INTRAVENOUS | Status: AC
  Filled 2024-01-31: qty 100

## 2024-01-31 SURGICAL SUPPLY — 47 items
APPLIER CLIP ROT 10 11.4 M/L (STAPLE) ×1 IMPLANT
BAG COUNTER SPONGE SURGICOUNT (BAG) ×1 IMPLANT
CANISTER SUCT 3000ML PPV (MISCELLANEOUS) ×1 IMPLANT
CATH URETL OPEN END 6FR 70 (CATHETERS) IMPLANT
CHLORAPREP W/TINT 26 (MISCELLANEOUS) ×1 IMPLANT
CLIP APPLIE ROT 10 11.4 M/L (STAPLE) ×1 IMPLANT
COVER MAYO STAND STRL (DRAPES) ×1 IMPLANT
COVER SURGICAL LIGHT HANDLE (MISCELLANEOUS) ×1 IMPLANT
DERMABOND ADVANCED .7 DNX12 (GAUZE/BANDAGES/DRESSINGS) ×1 IMPLANT
DRAPE C-ARM 42X120 X-RAY (DRAPES) ×1 IMPLANT
ELECT REM PT RETURN 9FT ADLT (ELECTROSURGICAL) ×1 IMPLANT
ELECTRODE REM PT RTRN 9FT ADLT (ELECTROSURGICAL) ×1 IMPLANT
ENDOLOOP SUT PDS II 0 18 (SUTURE) IMPLANT
GLOVE BIO SURGEON STRL SZ7 (GLOVE) ×1 IMPLANT
GOWN STRL REUS W/ TWL LRG LVL3 (GOWN DISPOSABLE) ×2 IMPLANT
GOWN STRL REUS W/ TWL XL LVL3 (GOWN DISPOSABLE) ×1 IMPLANT
GRASPER SUT TROCAR 14GX15 (MISCELLANEOUS) ×1 IMPLANT
IRRIG SUCT STRYKERFLOW 2 WTIP (MISCELLANEOUS) ×1 IMPLANT
IRRIGATION SUCT STRKRFLW 2 WTP (MISCELLANEOUS) ×1 IMPLANT
KIT BASIN OR (CUSTOM PROCEDURE TRAY) ×1 IMPLANT
KIT IMAGING PINPOINTPAQ (MISCELLANEOUS) IMPLANT
KIT PROCEDURE DVNC SI (MISCELLANEOUS) IMPLANT
KIT TURNOVER KIT B (KITS) ×1 IMPLANT
L-HOOK LAP DISP 36CM (ELECTROSURGICAL) ×1 IMPLANT
LHOOK LAP DISP 36CM (ELECTROSURGICAL) ×1 IMPLANT
NDL 22X1.5 STRL (OR ONLY) (MISCELLANEOUS) ×1 IMPLANT
NDL INSUFFLATION 14GA 120MM (NEEDLE) ×1 IMPLANT
NEEDLE 22X1.5 STRL (OR ONLY) (MISCELLANEOUS) ×1 IMPLANT
NEEDLE INSUFFLATION 14GA 120MM (NEEDLE) ×1 IMPLANT
NS IRRIG 1000ML POUR BTL (IV SOLUTION) ×1 IMPLANT
PAD ARMBOARD POSITIONER FOAM (MISCELLANEOUS) ×1 IMPLANT
PENCIL BUTTON HOLSTER BLD 10FT (ELECTRODE) ×1 IMPLANT
POUCH RETRIEVAL ECOSAC 10 (ENDOMECHANICALS) ×1 IMPLANT
SCISSORS LAP 5X35 DISP (ENDOMECHANICALS) ×1 IMPLANT
SET CHOLANGIOGRAPH 5 50 .035 (SET/KITS/TRAYS/PACK) ×1 IMPLANT
SET TUBE SMOKE EVAC HIGH FLOW (TUBING) ×1 IMPLANT
SLEEVE Z-THREAD 5X100MM (TROCAR) ×2 IMPLANT
SPECIMEN JAR SMALL (MISCELLANEOUS) ×1 IMPLANT
STOPCOCK 4 WAY LG BORE MALE ST (IV SETS) IMPLANT
SUT MNCRL AB 4-0 PS2 18 (SUTURE) ×1 IMPLANT
TOWEL GREEN STERILE (TOWEL DISPOSABLE) ×1 IMPLANT
TOWEL GREEN STERILE FF (TOWEL DISPOSABLE) ×1 IMPLANT
TRAY LAPAROSCOPIC MC (CUSTOM PROCEDURE TRAY) ×1 IMPLANT
TROCAR Z THREAD OPTICAL 12X100 (TROCAR) ×1 IMPLANT
TROCAR Z-THREAD OPTICAL 5X100M (TROCAR) ×1 IMPLANT
WARMER LAPAROSCOPE (MISCELLANEOUS) ×1 IMPLANT
WATER STERILE IRR 1000ML POUR (IV SOLUTION) ×1 IMPLANT

## 2024-01-31 NOTE — Anesthesia Procedure Notes (Signed)
 Procedure Name: Intubation Date/Time: 01/31/2024 12:05 PM  Performed by: Camillia Herter, CRNAPre-anesthesia Checklist: Patient identified, Emergency Drugs available, Suction available and Patient being monitored Patient Re-evaluated:Patient Re-evaluated prior to induction Oxygen Delivery Method: Circle System Utilized Preoxygenation: Pre-oxygenation with 100% oxygen Induction Type: IV induction Ventilation: Mask ventilation without difficulty Laryngoscope Size: Miller and 2 Grade View: Grade I Tube type: Oral Tube size: 7.0 mm Number of attempts: 1 Airway Equipment and Method: Stylet and Oral airway Placement Confirmation: ETT inserted through vocal cords under direct vision, positive ETCO2 and breath sounds checked- equal and bilateral Secured at: 21 cm Tube secured with: Tape Dental Injury: Teeth and Oropharynx as per pre-operative assessment

## 2024-01-31 NOTE — Addendum Note (Signed)
 Addendum  created 01/31/24 1507 by Camillia Herter, CRNA   LDA properties accepted

## 2024-01-31 NOTE — Anesthesia Preprocedure Evaluation (Addendum)
 Anesthesia Evaluation  Patient identified by MRN, date of birth, ID band Patient awake    Reviewed: Allergy & Precautions, NPO status , Patient's Chart, lab work & pertinent test results  Airway Mallampati: I  TM Distance: >3 FB     Dental  (+) Edentulous Upper, Edentulous Lower   Pulmonary shortness of breath, with exertion and Long-Term Oxygen Therapy, asthma , sleep apnea and Continuous Positive Airway Pressure Ventilation , COPD,  COPD inhaler and oxygen dependent, former smoker   breath sounds clear to auscultation + decreased breath sounds      Cardiovascular negative cardio ROS Normal cardiovascular exam Rhythm:Regular Rate:Normal     Neuro/Psych  Headaches PSYCHIATRIC DISORDERS Anxiety Depression     Neuromuscular disease    GI/Hepatic ,GERD  Medicated,,Elevated LFT's Gallstone panccreatitis   Endo/Other  Hyperlipidemia  Renal/GU negative Renal ROS  negative genitourinary   Musculoskeletal  (+) Arthritis , Osteoarthritis,    Abdominal   Peds  Hematology  (+) Blood dyscrasia Thrombocytopenia   Anesthesia Other Findings   Reproductive/Obstetrics                             Anesthesia Physical Anesthesia Plan  ASA: 4  Anesthesia Plan: General   Post-op Pain Management: Minimal or no pain anticipated   Induction: Intravenous, Cricoid pressure planned and Rapid sequence  PONV Risk Score and Plan: 4 or greater and Treatment may vary due to age or medical condition, Ondansetron and Dexamethasone  Airway Management Planned: Oral ETT  Additional Equipment: None  Intra-op Plan:   Post-operative Plan: Extubation in OR  Informed Consent: I have reviewed the patients History and Physical, chart, labs and discussed the procedure including the risks, benefits and alternatives for the proposed anesthesia with the patient or authorized representative who has indicated his/her  understanding and acceptance.   Patient has DNR.  Discussed DNR with patient and Suspend DNR.   Dental advisory given  Plan Discussed with: CRNA and Anesthesiologist  Anesthesia Plan Comments:         Anesthesia Quick Evaluation

## 2024-01-31 NOTE — Transfer of Care (Signed)
 Immediate Anesthesia Transfer of Care Note  Patient: [name] [name]  Procedure(s) Performed: LAPAROSCOPIC CHOLECYSTECTOMY WITH INTRAOPERATIVE CHOLANGIOGRAM  Patient Location: PACU  Anesthesia Type:General  Level of Consciousness: awake and alert   Airway & Oxygen Therapy: Patient Spontanous Breathing and Patient connected to nasal cannula oxygen  Post-op Assessment: Report given to RN and Post -op Vital signs reviewed and stable  Post vital signs: Reviewed and stable  Last Vitals:  Vitals Value Taken Time  BP 123/62 01/31/24 1400  Temp    Pulse 69 01/31/24 1401  Resp 31 01/31/24 1401  SpO2 95 % 01/31/24 1401  Vitals shown include unfiled device data.  Last Pain:  Vitals:   01/31/24 0833  TempSrc:   PainSc: 0-No pain      Patients Stated Pain Goal: 0 (01/29/24 0920)  Complications: No notable events documented.

## 2024-01-31 NOTE — Progress Notes (Signed)
  *   Day of Surgery *  Subjective: No new complaints. Abdominal pain resolving.   ROS: See above, otherwise other systems negative  Objective: Vital signs in last 24 hours: Temp:  [97.5 F (36.4 C)-98 F (36.7 C)] 97.8 F (36.6 C) (03/26 0821) Pulse Rate:  [60-65] 61 (03/26 0821) Resp:  [16-18] 18 (03/26 0821) BP: (110-121)/(61-75) 121/75 (03/26 0821) SpO2:  [90 %-94 %] 90 % (03/26 0821) Weight:  [88.9 kg] 88.9 kg (03/26 0821) Last BM Date : 01/27/24  Intake/Output from previous day: No intake/output data recorded. Intake/Output this shift: No intake/output data recorded.  PE: Gen: female, NAD Abd: soft, non-distended, mild TTP in the epigastric region  Lab Results:  Recent Labs    01/30/24 0917 01/31/24 0433  WBC 6.8 5.3  HGB 13.2 13.7  HCT 38.9 39.6  PLT 106* 112*   BMET Recent Labs    01/30/24 0525 01/31/24 0433  NA 138 142  K 3.8 4.4  CL 108 106  CO2 26 29  GLUCOSE 86 84  BUN 6* <5*  CREATININE 0.53 0.57  CALCIUM 8.5* 9.3   PT/INR Recent Labs    01/30/24 0525  LABPROT 13.3  INR 1.0   CMP     Component Value Date/Time   NA 142 01/31/2024 0433   NA 139 08/07/2023 1456   K 4.4 01/31/2024 0433   CL 106 01/31/2024 0433   CO2 29 01/31/2024 0433   GLUCOSE 84 01/31/2024 0433   BUN <5 (L) 01/31/2024 0433   BUN 6 (L) 08/07/2023 1456   CREATININE 0.57 01/31/2024 0433   CREATININE 0.55 05/23/2014 1050   CALCIUM 9.3 01/31/2024 0433   PROT 5.6 (L) 01/31/2024 0433   PROT 6.0 08/07/2023 1456   ALBUMIN 2.7 (L) 01/31/2024 0433   ALBUMIN 3.9 08/07/2023 1456   AST 15 01/31/2024 0433   ALT 79 (H) 01/31/2024 0433   ALKPHOS 140 (H) 01/31/2024 0433   BILITOT 0.8 01/31/2024 0433   BILITOT 0.3 08/07/2023 1456   GFRNONAA >60 01/31/2024 0433   GFRNONAA >89 05/23/2014 1050   GFRAA >60 06/03/2020 1149   GFRAA >89 05/23/2014 1050   Lipase     Component Value Date/Time   LIPASE 798 (H) 01/28/2024 2305    Studies/Results: No results  found.  Anti-infectives: Anti-infectives (From admission, onward)    Start     Dose/Rate Route Frequency Ordered Stop   01/31/24 0900  ceFAZolin (ANCEF) IVPB 2g/100 mL premix        2 g 200 mL/hr over 30 Minutes Intravenous On call to O.R. 01/31/24 0802 02/01/24 0559   01/31/24 0806  ceFAZolin (ANCEF) 2-4 GM/100ML-% IVPB       Note to Pharmacy: Lurena Nida: cabinet override      01/31/24 0806 01/31/24 2014       Assessment/Plan 68 y/o F who presented with abdominal pain and had a workup c/w gallstone pancreatitis with a possible inflammatory stricture of the distal CBD.   - Will proceed to the OR. We discussed the alternatives and potential risks of surgery, including but not limited to: bleeding, infection, damage to bowel or surrounding structures, bile leak, damage to the biliary system, pancreatitis, retained stone, and need for additional procedures. All questions were addressed and consent was obtained.      LOS: 2 days   Tacy Learn Surgery 01/31/2024, 11:25 AM Please see Amion for pager number during day hours 7:00am-4:30pm or 7:00am -11:30am on weekends

## 2024-01-31 NOTE — Anesthesia Postprocedure Evaluation (Signed)
 Anesthesia Post Note  Patient: [name] [name]  Procedure(s) Performed: LAPAROSCOPIC CHOLECYSTECTOMY WITH INTRAOPERATIVE CHOLANGIOGRAM     Patient location during evaluation: PACU Anesthesia Type: General Level of consciousness: awake and alert and oriented Pain management: pain level controlled Vital Signs Assessment: post-procedure vital signs reviewed and stable Respiratory status: spontaneous breathing, nonlabored ventilation, respiratory function stable and patient connected to nasal cannula oxygen Cardiovascular status: blood pressure returned to baseline and stable Postop Assessment: no apparent nausea or vomiting Anesthetic complications: no   No notable events documented.  Last Vitals:  Vitals:   01/31/24 1415 01/31/24 1430  BP: 117/63 113/67  Pulse: 64 62  Resp: 20 14  Temp:  (!) 36.4 C  SpO2: 94% 100%    Last Pain:  Vitals:   01/31/24 1415  TempSrc:   PainSc: 8                  Yehudit Fulginiti A.

## 2024-01-31 NOTE — TOC Progression Note (Signed)
 Transition of Care [facility]) - Progression Note    Patient Details  Name: [name] [name] MRN: [id number] Date of Birth: [birth year]  [facility] CM/SW Contact  Harriet Masson, RN Phone Number: 01/31/2024, 9:53 AM  Clinical Narrative:    Patient in OR for lap chole today.    Expected Discharge Plan: Home w Home Health Services Barriers to Discharge: Continued Medical Work up  Expected Discharge Plan and Services       Living arrangements for the past 2 months: Single Family Home                             HH Agency: [facility] Date [place] Agency Contacted: 01/29/24 Time HH Agency Contacted: 1332 Representative spoke with at [facility] Agency: Kandee Keen   Social Determinants of Health (SDOH) Interventions SDOH Screenings   Food Insecurity: No Food Insecurity (01/29/2024)  Housing: Low Risk  (01/29/2024)  Transportation Needs: No Transportation Needs (01/29/2024)  Utilities: Not At Risk (01/29/2024)  Alcohol Screen: Low Risk  (12/07/2022)  Depression (PHQ2-9): Low Risk  (01/23/2024)  Financial Resource Strain: Medium Risk (12/07/2022)  Physical Activity: Insufficiently Active (12/07/2022)  Social Connections: Socially Integrated (01/29/2024)  Stress: No Stress Concern Present (12/07/2022)  Tobacco Use: Medium Risk (01/31/2024)    Readmission Risk Interventions    07/14/2023    3:15 PM  Readmission Risk Prevention Plan  Transportation Screening Complete  PCP or Specialist Appt within 5-7 Days Complete  Home Care Screening Complete  Medication Review (RN CM) Complete

## 2024-01-31 NOTE — Op Note (Signed)
 Patient: [name] [name] MRN: [id number] DOB: 1956/01/08 Sex: female Operation/Procedure Date: 01/31/2024  Surgeons and Role:    * Hillery Hunter Lucilla Edin, MD - Primary  Pre-operative Diagnoses: Gallstone pancreatitis  Postoperative Diagnoses: Cholecystitis  Procedure performed: Laparoscopic cholecystectomy with intra-operative cholangiogram  Anesthesia: General endotracheal anesthesia  Indications: [name] [name] is a 68 y/o F who presented to the hospital with significant abdominal pain. Based on her imaging, history, and exam she was diagnosed with gallstone pancreatitis. Her abdominal pain improved and she was offered cholecystectomy. Preoperatively, I discussed in detail the risks, benefits, alternatives, and potential complications. The patient understands and requests to proceed.  Operative Findings: Signs of cholecystitis.  Normal intraoperative cholangiogram.  Operative Narrative: The patient was positively identified and was taken to the operating room and placed supine on the operating table. A time-out was performed confirming correct patient and procedure. We also confirmed initiation of deep venous thrombosis prophylaxis and wound prophylaxis. After successful induction of general endotracheal anesthesia, the arms were carefully padded. An orogastric tube and footboard were placed. The abdomen was prepped and draped in the usual sterile surgical fashion.  We began out access with a Veress needle in the LUQ at Palmer's point.  Following a aspiration of air and a positive saline drop test, the insufflation tubing was connected and the abdomen brought to a pressure of . A 5-mm port was placed right of the umbilicus using an opti-view technique. The abdomen was inspected and there were no signs of injury from access.  We placed three additional ports all under direct vision, a 10-mm port in the subxiphoid position, one 5-mm port in the right midclavicular line, and one  right subcostal port in the anterior axillary line. The patient was placed in the head up position and tilted slightly to the left.  There were multiple adhesions between the gallbladder and small bowel that were freed using blunt dissection. The dome of the gallbladder was grasped, elevated, and retracted anteriorly and cephalad. The infundibulum was retracted laterally and inferiorly exposing Calot's triangle. The investing visceral peritoneal attachments overlying the infundibulum of the gallbladder were dissected free from the gallbladder itself. We soon developed two structures into the gallbladder consistent with the cystic duct and cystic artery. The loose areolar tissue around these structures was dissected free. The gallbladder was separated from the gallbladder fossa for approximately a third the distance up from the cystic plate, establishing the critical view of safety. We then performed a cholangiogram using a cook catheter inserted into the cystic duct and secured with a clip.This revealed adequate length of cystic duct entering into normal caliber common bile duct. The common hepatic duct right and left ducts were normal. There was appropriate sectoral branching seen bilaterally. There was no evidence of filling defect or stricture distally. There was prompt filling of the duodenum. The catheter was then removed and the cystic duct and cystic artery triply clipped. These were divided using laparoscopic scissors leaving a single clip on the removal side. The gallbladder was elevated off the gallbladder fossa using the hook Bovie. The gallbladder was then exteriorized through the subxiphoid position using a specimen bag. We reestablished pneumoperitoneum and confirmed no leakage of blood or bile. The subhepatic space was irrigated with warm sterile saline and suctioned free. The subxiphoid port was removed and the defect closed with an interrupted 0 vicryl on a suture passer.  The other ports were  removed under direct vision and the abdomen was desufflated. We placed 0.25% Marcaine plain  at each incision site for local anesthesia. The skin was closed using 4-0 Monocryl subcuticular suture. Dermabond was applied. The patient tolerated the procedure well, was extubated, and taken to the recovery room.  Estimated Blood Loss: Minimal Specimens: Gallbladder Implants: None Drains: None Complications: None Condition of the patient: Good, extubated Disposition: PACU   Moise Boring Date: 01/31/2024 Time: 1:30 PM

## 2024-01-31 NOTE — Progress Notes (Cosign Needed Addendum)
  Overnight events: None  Subjective: Back from surgery, doing well. A little abdominal tenderness. No new concerns.   Objective:  Vital signs in last 24 hours: Vitals:   01/31/24 1400 01/31/24 1415 01/31/24 1430 01/31/24 1626  BP: 123/62 117/63 113/67 126/71  Pulse: 68 64 62 69  Resp: 20 20 14    Temp:   (!) 97.5 F (36.4 C) 98 F (36.7 C)  TempSrc:      SpO2: 97% 94% 100% 96%  Weight:      Height:       Physical Exam: Constitutional: Well-appearing, no acute distress Cardio: Regular rate and rhythm, euvolemic Pulm: Clear to auscultation bilaterally Abdomen: Soft, mild tenderness, no distension Skin: No lesions or skin changes Neuro: Alert and oriented x 3, no focal deficits Psych: Normal mood and affect  Labs: Alk phos 140 AST 15 ALT 79 CBC with chronic stable thrombocytopenia  Assessment/Plan: Principal Problem:   Common biliary duct stricture Active Problems:   COPD (chronic obstructive pulmonary disease) (HCC)   Thrombocytopenia (HCC)   Cholelithiasis   Pancreatitis, acute   Elevated LFTs  Gallstone pancreatitis, s/p cholecystectomy Common biliary duct stricture, inflammatory Had cholecystectomy today with intraoperative cholangiogram. Doing well after surgery. No new concerns, will advance diet as tolerated. If she continues to do well, may be able to discharge tomorrow.  - restarting liquid diet, advance diet as tolerated - zofran q8h prn - tylenol, dilaudid for pain control  Chronic conditions: COPD/Chronic hypoxic respiratory failure on 3L [place]: At baseline. Continue home meds and duonebs PRN. Outpatient PFTs.  Thrombocytopenia: Stable, chronic.  Anxiety/Depression: Zoloft 50 mg daily. Continue Xanax 1 mg nightly.  Insomnia: Trazodone nightly GERD: PPI Obesity: On ozempic  Diet: Liquid; advance diet as tolerate IVF: None VTE: Enoxaparin Code: DNR-Limited   Dispo: Anticipated discharge to  Home pending medical stability and safe dispo plan.    Annett Fabian, MD 01/31/2024, 5:53 PM Pager: [phone] After 5pm on weekdays and 1pm on weekends: On Call pager [phone]

## 2024-02-01 ENCOUNTER — Encounter (HOSPITAL_COMMUNITY): Payer: Self-pay | Admitting: General Surgery

## 2024-02-01 DIAGNOSIS — K831 Obstruction of bile duct: Secondary | ICD-10-CM | POA: Diagnosis not present

## 2024-02-01 LAB — COMPREHENSIVE METABOLIC PANEL WITH GFR
ALT: 55 U/L — ABNORMAL HIGH (ref 0–44)
AST: 20 U/L (ref 15–41)
Albumin: 2.7 g/dL — ABNORMAL LOW (ref 3.5–5.0)
Alkaline Phosphatase: 137 U/L — ABNORMAL HIGH (ref 38–126)
Anion gap: 8 (ref 5–15)
BUN: 5 mg/dL — ABNORMAL LOW (ref 8–23)
CO2: 26 mmol/L (ref 22–32)
Calcium: 9 mg/dL (ref 8.9–10.3)
Chloride: 103 mmol/L (ref 98–111)
Creatinine, Ser: 0.52 mg/dL (ref 0.44–1.00)
GFR, Estimated: >60 mL/min (ref >60)
Glucose, Bld: 89 mg/dL (ref 70–99)
Potassium: 4 mmol/L (ref 3.5–5.1)
Sodium: 137 mmol/L (ref 135–145)
Total Bilirubin: 1 mg/dL (ref 0.0–1.2)
Total Protein: 5.7 g/dL — ABNORMAL LOW (ref 6.5–8.1)

## 2024-02-01 LAB — SURGICAL PATHOLOGY

## 2024-02-01 MED ORDER — OXYCODONE HCL 5 MG PO TABS
5.0000 mg | ORAL_TABLET | ORAL | Status: DC | PRN
  Administered 2024-02-01 (×3): 10 mg via ORAL
  Administered 2024-02-02 – 2024-02-03 (×3): 5 mg via ORAL
  Filled 2024-02-01 (×2): qty 1
  Filled 2024-02-01: qty 2
  Filled 2024-02-01: qty 1
  Filled 2024-02-01 (×2): qty 2

## 2024-02-01 MED ORDER — OXYCODONE HCL 5 MG PO TABS
5.0000 mg | ORAL_TABLET | ORAL | Status: DC | PRN
  Administered 2024-02-01: 5 mg via ORAL
  Filled 2024-02-01: qty 1

## 2024-02-01 MED ORDER — SENNOSIDES-DOCUSATE SODIUM 8.6-50 MG PO TABS
1.0000 | ORAL_TABLET | Freq: Two times a day (BID) | ORAL | Status: DC
  Administered 2024-02-01 – 2024-02-03 (×5): 1 via ORAL
  Filled 2024-02-01 (×7): qty 1

## 2024-02-01 MED ORDER — HYDROMORPHONE HCL 1 MG/ML IJ SOLN: 0.5000 mg | INTRAMUSCULAR | Status: DC | PRN

## 2024-02-01 MED ORDER — METHOCARBAMOL 500 MG PO TABS
500.0000 mg | ORAL_TABLET | Freq: Four times a day (QID) | ORAL | Status: DC
  Administered 2024-02-01 – 2024-02-04 (×13): 500 mg via ORAL
  Filled 2024-02-01 (×13): qty 1

## 2024-02-01 MED ORDER — ACETAMINOPHEN 650 MG RE SUPP: 650.0000 mg | Freq: Four times a day (QID) | RECTAL | Status: DC

## 2024-02-01 MED ORDER — POLYETHYLENE GLYCOL 3350 17 G PO PACK
17.0000 g | PACK | Freq: Two times a day (BID) | ORAL | Status: DC
  Administered 2024-02-01 – 2024-02-03 (×5): 17 g via ORAL
  Filled 2024-02-01 (×7): qty 1

## 2024-02-01 MED ORDER — ENOXAPARIN SODIUM 40 MG/0.4ML IJ SOSY
40.0000 mg | PREFILLED_SYRINGE | INTRAMUSCULAR | Status: DC
  Administered 2024-02-01 – 2024-02-04 (×4): 40 mg via SUBCUTANEOUS
  Filled 2024-02-01 (×4): qty 0.4

## 2024-02-01 MED ORDER — LACTATED RINGERS IV SOLN: INTRAVENOUS | Status: AC

## 2024-02-01 MED ORDER — ACETAMINOPHEN 500 MG PO TABS: 1000.0000 mg | ORAL_TABLET | Freq: Four times a day (QID) | ORAL | Status: DC

## 2024-02-01 MED ORDER — POLYETHYLENE GLYCOL 3350 17 G PO PACK: 17.0000 g | PACK | Freq: Every day | ORAL | Status: DC

## 2024-02-01 NOTE — Plan of Care (Signed)

## 2024-02-01 NOTE — Evaluation (Addendum)
 Occupational Therapy Evaluation Patient Details Name: [name] [name] MRN: [id number] DOB: 06-11-1956 Today's Date: 02/01/2024   History of Present Illness   Pt is a 68 YO F admitted 01/30/24 with severe abdominal pain and underwent laparoscopic cholecystectomy 3/26 for gallstone pancreatitis. Admitted 01/08/24 with resp failure and flu A. PMH: Anxiety, Asthma, GERD, COPD, Depression, Obesity, Sleep apnea, home O2.     Clinical Impressions Pt admitted for above and limited by problem list below.  PTA patient was independent with ADLs, ambulating with RW vs quad cane, and managing IADls/driving.  Pts spouse is currently at SNF, and she reports having no support at home. Today, patient requires up to min guard assist with ADLs, and min guard with transfers and functional mobility using RW in room.  Pt slow moving and guarded throughout session, mostly limited by abdominal pain. Cognitively, requires demonstrates decreased short term memory, attention and problem solving- scoring 10/28 on short blessed test and will benefit from pill box test to assess functional cognition. Based on performance today, pt will best benefit from continued OT services acutely and after dc at [facility] level to optimize independence and safety with ADLs, IADLs and mobility.      If plan is discharge home, recommend the following:   A little help with walking and/or transfers;A little help with bathing/dressing/bathroom;Assistance with cooking/housework;Assist for transportation     Functional Status Assessment   Patient has had a recent decline in their functional status and demonstrates the ability to make significant improvements in function in a reasonable and predictable amount of time.     Equipment Recommendations   None recommended by OT     Recommendations for Other Services         Precautions/Restrictions   Precautions Precautions: Fall Recall of Precautions/Restrictions:  Intact Restrictions Weight Bearing Restrictions Per Provider Order: No     Mobility Bed Mobility               General bed mobility comments: OOB in recliner upon entry    Transfers Overall transfer level: Needs assistance Equipment used: Rolling walker (2 wheels) Transfers: Sit to/from Stand Sit to Stand: Contact guard assist           General transfer comment: good hand placement, guarded and increased time but no physical assist required      Balance Overall balance assessment: Needs assistance Sitting-balance support: No upper extremity supported, Feet supported Sitting balance-Leahy Scale: Good     Standing balance support: Bilateral upper extremity supported, No upper extremity supported, During functional activity Standing balance-Leahy Scale: Fair Standing balance comment: relies on RW dynamically                           ADL either performed or assessed with clinical judgement   ADL Overall ADL's : Needs assistance/impaired     Grooming: Supervision/safety;Standing;Oral care           Upper Body Dressing : Set up;Sitting   Lower Body Dressing: Contact guard assist;Sit to/from stand   Toilet Transfer: Contact guard assist;Ambulation;Rolling walker (2 wheels)           Functional mobility during ADLs: Contact guard assist;Rolling walker (2 wheels) General ADL Comments: slow moving, guarded due to abd pian     Vision   Vision Assessment?: No apparent visual deficits     Perception         Praxis         Pertinent Vitals/Pain  Pain Assessment Pain Assessment: 0-10 Pain Score: 8  Pain Location: abd Pain Descriptors / Indicators: Discomfort, Operative site guarding, Sharp, Stabbing Pain Intervention(s): Limited activity within patient's tolerance, Monitored during session, Repositioned, Premedicated before session     Extremity/Trunk Assessment Upper Extremity Assessment Upper Extremity Assessment: Generalized  weakness   Lower Extremity Assessment Lower Extremity Assessment: Defer to PT evaluation   Cervical / Trunk Assessment Cervical / Trunk Assessment: Other exceptions Cervical / Trunk Exceptions: abd pain   Communication Communication Communication: No apparent difficulties   Cognition Arousal: Alert Behavior During Therapy: WFL for tasks assessed/performed Cognition: No family/caregiver present to determine baseline             OT - Cognition Comments: suspect low health literacy - scored 10/28 on short blessed test.  pt reports baseline difficulty with recall and way finding.  Would benefit from pill box test.                 Following commands: Intact       Cueing  General Comments   Cueing Techniques: Verbal cues      Exercises     Shoulder Instructions      Home Living Family/patient expects to be discharged to:: Private residence Living Arrangements: Spouse/significant other (spouse currently in SNF)   Type of Home: House Home Access: Ramped entrance     Home Layout: One level     Bathroom Shower/Tub: Chief Strategy Officer: Standard     Home Equipment: Cane - quad;Shower seat;Rolling Walker (2 wheels);Grab bars - tub/shower;Grab bars - toilet;Toilet riser;Hand held shower head   Additional Comments: reports no assist at home      Prior Functioning/Environment Prior Level of Function : Independent/Modified Independent;Driving             Mobility Comments: using RW at home, quad cane in commuinty ADLs Comments: She was modified independent to independent with ADLs, cooking, cleaning, and driving.    OT Problem List: Decreased activity tolerance;Impaired balance (sitting and/or standing);Decreased safety awareness;Cardiopulmonary status limiting activity   OT Treatment/Interventions: Self-care/ADL training;Energy conservation;DME and/or AE instruction;Therapeutic activities;Patient/family education;Balance  training;Therapeutic exercise      OT Goals(Current goals can be found in the care plan section)   Acute Rehab OT Goals Patient Stated Goal: home OT Goal Formulation: With patient Time For Goal Achievement: 02/15/24 Potential to Achieve Goals: Good   OT Frequency:  Min 2X/week    Co-evaluation              AM-PAC OT "6 Clicks" Daily Activity     Outcome Measure Help from another person eating meals?: None Help from another person taking care of personal grooming?: A Little Help from another person toileting, which includes using toliet, bedpan, or urinal?: A Little Help from another person bathing (including washing, rinsing, drying)?: A Little Help from another person to put on and taking off regular upper body clothing?: A Little Help from another person to put on and taking off regular lower body clothing?: A Little 6 Click Score: 19   End of Session Equipment Utilized During Treatment: Rolling walker (2 wheels) Nurse Communication: Mobility status  Activity Tolerance: Patient tolerated treatment well Patient left: in chair;with call bell/phone within reach  OT Visit Diagnosis: Unsteadiness on feet (R26.81);Muscle weakness (generalized) (M62.81);Pain Pain - part of body:  (abd)                Time: 1610-9604 OT Time Calculation (min): 20 min Charges:  OT  General Charges $OT Visit: 1 Visit OT Evaluation $OT Eval Moderate Complexity: 1 Mod  Barry Brunner, OT Acute Rehabilitation Services Office [phone] Secure Chat Preferred    Chancy Milroy 02/01/2024, 11:02 AM

## 2024-02-01 NOTE — Progress Notes (Signed)
 1 Day Post-Op  Subjective: Complains of significant post op abdominal pain. Has had a small amount of PO since OR (piece of bread and liquids) without n/v/worsening abdominal pain. Passing flatus no BM for several days  Objective: Vital signs in last 24 hours: Temp:  [97.5 F (36.4 C)-98.4 F (36.9 C)] 98.4 F (36.9 C) (03/27 0737) Pulse Rate:  [61-100] 61 (03/27 0737) Resp:  [14-20] 18 (03/27 0501) BP: (108-126)/(59-88) 108/59 (03/27 0737) SpO2:  [92 %-100 %] 92 % (03/27 0737) Last BM Date : 01/27/24  Intake/Output from previous day: 03/26 0701 - 03/27 0700 In: 920 [P.O.:420; I.V.:500] Out: 375 [Urine:300; Blood:75] Intake/Output this shift: No intake/output data recorded.  PE: Gen: female, NAD Abd: soft, non-distended, mild to moderate diffuse abdominal TTP greatest over incisions which are cdi with surgical glue  Lab Results:  Recent Labs    01/30/24 0917 01/31/24 0433  WBC 6.8 5.3  HGB 13.2 13.7  HCT 38.9 39.6  PLT 106* 112*   BMET Recent Labs    01/31/24 0433 02/01/24 0442  NA 142 137  K 4.4 4.0  CL 106 103  CO2 29 26  GLUCOSE 84 89  BUN <5* 5*  CREATININE 0.57 0.52  CALCIUM 9.3 9.0   PT/INR Recent Labs    01/30/24 0525  LABPROT 13.3  INR 1.0   CMP     Component Value Date/Time   NA 137 02/01/2024 0442   NA 139 08/07/2023 1456   K 4.0 02/01/2024 0442   CL 103 02/01/2024 0442   CO2 26 02/01/2024 0442   GLUCOSE 89 02/01/2024 0442   BUN 5 (L) 02/01/2024 0442   BUN 6 (L) 08/07/2023 1456   CREATININE 0.52 02/01/2024 0442   CREATININE 0.55 05/23/2014 1050   CALCIUM 9.0 02/01/2024 0442   PROT 5.7 (L) 02/01/2024 0442   PROT 6.0 08/07/2023 1456   ALBUMIN 2.7 (L) 02/01/2024 0442   ALBUMIN 3.9 08/07/2023 1456   AST 20 02/01/2024 0442   ALT 55 (H) 02/01/2024 0442   ALKPHOS 137 (H) 02/01/2024 0442   BILITOT 1.0 02/01/2024 0442   BILITOT 0.3 08/07/2023 1456   GFRNONAA >60 02/01/2024 0442   GFRNONAA >89 05/23/2014 1050   GFRAA >60  06/03/2020 1149   GFRAA >89 05/23/2014 1050   Lipase     Component Value Date/Time   LIPASE 798 (H) 01/28/2024 2305    Studies/Results: DG Cholangiogram Operative Result Date: 01/31/2024 CLINICAL DATA:  Elective surgery.  Cholelithiasis. EXAM: INTRAOPERATIVE CHOLANGIOGRAM TECHNIQUE: Cholangiographic images from the C-arm fluoroscopic device were submitted for interpretation post-operatively. Radiation Exposure Index (as provided by the fluoroscopic device): 5.71 mGy Kerma COMPARISON:  MRI 01/29/2024 FINDINGS: Contrast extravasation in the right upper abdomen and minimal filling of the biliary system. Increased opacification of the biliary system on the second set images. The common bile duct is dilated but there is no contrast identified in the duodenum. IMPRESSION: 1. Limited examination due to large amount of contrast extravasation likely related to difficulties with cannulation. 2. Opacified biliary system is dilated without filling of the duodenum. Cannot exclude a distal common bile duct obstruction. Electronically Signed   By: Richarda Overlie M.D.   On: 01/31/2024 17:40    Anti-infectives: Anti-infectives (From admission, onward)    Start     Dose/Rate Route Frequency Ordered Stop   01/31/24 0900  ceFAZolin (ANCEF) IVPB 2g/100 mL premix        2 g 200 mL/hr over 30 Minutes Intravenous On call to O.R.  01/31/24 0802 01/31/24 1215   01/31/24 0806  ceFAZolin (ANCEF) 2-4 GM/100ML-% IVPB       Note to Pharmacy: Lurena Nida: cabinet override      01/31/24 0806 01/31/24 1232       Assessment/Plan 68 y/o F who presented with abdominal pain and had a workup c/w gallstone pancreatitis with a possible inflammatory stricture of the distal CBD.   POD1 lap chole with IOC Dr. Hillery Hunter 3/26 - LFTs improved - adjust pain regime (add robaxin) and increase bowel regimen - advance to reg diet  Stable for discharge home from surgical standpoint once tolerating solid diet and pain controlled with po  medications  FEN: reg ID: ancef periop VTE: lovenox (h/o of thrombocytopenia - recheck CBC am)  Eric Form PA - C [facility] Surgery 02/01/2024, 10:33 AM Please see Amion for pager number during day hours 7:00am-4:30pm or 7:00am -11:30am on weekends

## 2024-02-01 NOTE — Progress Notes (Addendum)
  Overnight events: None  Subjective: Laying in bed, in discomfort. Has severe abdominal pain following surgery. Has not eaten, is not passing gas/having bowel movements. No fever, nausea/emesis.   Objective:  Vital signs in last 24 hours: Vitals:   01/31/24 1626 01/31/24 2015 02/01/24 0501 02/01/24 0737  BP: 126/71 120/71 118/67 (!) 108/59  Pulse: 69 69 65 61  Resp:  18 18   Temp: 98 F (36.7 C) 97.8 F (36.6 C) 98 F (36.7 C) 98.4 F (36.9 C)  TempSrc:  Oral Oral   SpO2: 96% 94% 93% 92%  Weight:      Height:       Physical Exam: Constitutional: Laying in bed, uncomfortable Cardio: Regular rate and rhythm, euvolemic Pulm: Clear to auscultation bilaterally Abdomen: significant diffuse tenderness, no bowel sounds appreciated, surgical incisions in tact Skin: No lesions or skin changes Neuro: Alert and oriented x 3, no focal deficits Psych: Normal mood and affect  Labs: Alk phos 137 LFTs improved  Assessment/Plan: Principal Problem:   Common biliary duct stricture Active Problems:   COPD (chronic obstructive pulmonary disease) (HCC)   Thrombocytopenia (HCC)   Cholelithiasis   Pancreatitis, acute   Elevated LFTs  Post-op ileus Has not eaten at all and is not passing gas or bowel movements. Bowel sounds absent on exam. Suspect she may have post-op ileus.  - Senna and miralax - IV maintenance fluids - PT/OT  Gallstone pancreatitis, s/p cholecystectomy Common biliary duct stricture, inflammatory Surgery went well. Has some post-op pain, which should improve over the next day or so.  - advancing diet as tolerated - zofran q8h prn - Oxycodone q4h prn for moderate pain, dilaudid 0.5 mg q4h prn for breakthrough pain - Robaxin 500 mg q4h  Chronic conditions: COPD/Chronic hypoxic respiratory failure on 3L [place]: At baseline. Continue home meds and duonebs PRN. Outpatient PFTs.  Thrombocytopenia: Stable, chronic. Will recheck CBC in am.  Anxiety/Depression: Zoloft 50 mg  daily. Continue Xanax 1 mg nightly.  Insomnia: Trazodone nightly GERD: PPI Obesity: On ozempic  Diet: Liquid; advance diet as tolerate IVF: None VTE: Enoxaparin Code: DNR-Limited   Dispo: Anticipated discharge to  Home with [facility] pending post-op recovery  Annett Fabian, MD 02/01/2024, 11:52 AM Pager: [phone] After 5pm on weekdays and 1pm on weekends: On Call pager [phone]

## 2024-02-01 NOTE — Progress Notes (Signed)
 Patient reported she can not take care of herself at home. She is requesting for home health.

## 2024-02-01 NOTE — Evaluation (Signed)
 Physical Therapy Evaluation Patient Details Name: [name] [name] MRN: [id number] DOB: 1956/10/23 Today's Date: 02/01/2024  History of Present Illness  Pt is a 68 YO F admitted 01/30/24 with severe abdominal pain and underwent laparoscopic cholecystectomy 3/26 for gallstone pancreatitis. Admitted 01/08/24 with resp failure and flu A. PMH: Anxiety, Asthma, GERD, COPD, Depression, Obesity, Sleep apnea, home O2.  Clinical Impression  Pt admitted with above diagnosis. Pt from home, has been caring for self since d/c home from flu. Husband still at SNF. Pt mobilizing at min A to CGA assist level, limited mostly by abdominal pain. SPO2 improved from last admission with sats remaining 95% on RA with ambulation. Recommend return home with HHPT and HHaide if possible.  Pt currently with functional limitations due to the deficits listed below (see PT Problem List). Pt will benefit from acute skilled PT to increase their independence and safety with mobility to allow discharge.           If plan is discharge home, recommend the following: A little help with bathing/dressing/bathroom;Assistance with cooking/housework;Assist for transportation   Can travel by private vehicle   Yes    Equipment Recommendations None recommended by PT  Recommendations for Other Services       Functional Status Assessment Patient has had a recent decline in their functional status and demonstrates the ability to make significant improvements in function in a reasonable and predictable amount of time.     Precautions / Restrictions Precautions Precautions: Fall Recall of Precautions/Restrictions: Intact Restrictions Weight Bearing Restrictions Per Provider Order: No      Mobility  Bed Mobility Overal bed mobility: Needs Assistance Bed Mobility: Supine to Sit     Supine to sit: Min assist     General bed mobility comments: assist needed due to pain    Transfers Overall transfer level: Needs  assistance Equipment used: Rolling walker (2 wheels) Transfers: Sit to/from Stand Sit to Stand: Contact guard assist           General transfer comment: good hand placement, guarded and increased time but no physical assist required    Ambulation/Gait Ambulation/Gait assistance: Contact guard assist Gait Distance (Feet): 75 Feet Assistive device: Rolling walker (2 wheels) Gait Pattern/deviations: Step-through pattern, Decreased stride length Gait velocity: decreased Gait velocity interpretation: 1.31 - 2.62 ft/sec, indicative of limited community ambulator   General Gait Details: slow gait due to pain but pt reported she actually felt better as she kept going and pace increased. SPO2 95% on RA  Stairs            Wheelchair Mobility     Tilt Bed    Modified Rankin (Stroke Patients Only)       Balance Overall balance assessment: Needs assistance Sitting-balance support: No upper extremity supported, Feet supported Sitting balance-Leahy Scale: Good     Standing balance support: Bilateral upper extremity supported, No upper extremity supported, During functional activity Standing balance-Leahy Scale: Fair Standing balance comment: relies on RW dynamically                             Pertinent Vitals/Pain Pain Assessment Pain Assessment: 0-10 Pain Score: 9  Pain Location: abd Pain Descriptors / Indicators: Discomfort, Operative site guarding, Sharp, Stabbing Pain Intervention(s): Limited activity within patient's tolerance, Monitored during session, Patient requesting pain meds-RN notified, RN gave pain meds during session    Home Living Family/patient expects to be discharged to:: Private residence Living Arrangements: Alone  Available Help at Discharge: Friend(s);Available PRN/intermittently Type of Home: House Home Access: Ramped entrance       Home Layout: One level Home Equipment: Cane - quad;Shower seat;Rolling Walker (2 wheels);Grab  bars - tub/shower;Grab bars - toilet;Toilet riser;Hand held shower head Additional Comments: reports no assist at home, husband is at SNF and when he is home she is his caregiver    Prior Function Prior Level of Function : Independent/Modified Independent;Driving             Mobility Comments: using RW at home, quad cane in commuinty ADLs Comments: She was modified independent to independent with ADLs, cooking, cleaning, and driving.     Extremity/Trunk Assessment   Upper Extremity Assessment Upper Extremity Assessment: Defer to OT evaluation    Lower Extremity Assessment Lower Extremity Assessment: Generalized weakness    Cervical / Trunk Assessment Cervical / Trunk Assessment: Other exceptions Cervical / Trunk Exceptions: abd pain  Communication   Communication Communication: No apparent difficulties    Cognition Arousal: Alert Behavior During Therapy: WFL for tasks assessed/performed   PT - Cognitive impairments: Memory                       PT - Cognition Comments: definite STM deficits. Does not remember things from beginning of session by the end. Unsure of whether or not this is her baseline Following commands: Intact       Cueing Cueing Techniques: Verbal cues     General Comments      Exercises     Assessment/Plan    PT Assessment Patient needs continued PT services  PT Problem List Decreased strength;Decreased activity tolerance;Decreased balance;Decreased mobility;Cardiopulmonary status limiting activity       PT Treatment Interventions DME instruction;Gait training;Stair training;Functional mobility training;Therapeutic activities;Therapeutic exercise;Balance training;Patient/family education;Cognitive remediation    PT Goals (Current goals can be found in the Care Plan section)  Acute Rehab PT Goals Patient Stated Goal: be safe, not fall PT Goal Formulation: With patient Time For Goal Achievement: 02/15/24 Potential to Achieve  Goals: Good    Frequency Min 3X/week     Co-evaluation               AM-PAC PT "6 Clicks" Mobility  Outcome Measure Help needed turning from your back to your side while in a flat bed without using bedrails?: A Little Help needed moving from lying on your back to sitting on the side of a flat bed without using bedrails?: A Little Help needed moving to and from a bed to a chair (including a wheelchair)?: A Little Help needed standing up from a chair using your arms (e.g., wheelchair or bedside chair)?: A Little Help needed to walk in hospital room?: A Little Help needed climbing 3-5 steps with a railing? : A Little 6 Click Score: 18    End of Session Equipment Utilized During Treatment: Gait belt Activity Tolerance: Patient limited by pain Patient left: with call bell/phone within reach;in chair Nurse Communication: Mobility status PT Visit Diagnosis: History of falling (Z91.81);Difficulty in walking, not elsewhere classified (R26.2);Muscle weakness (generalized) (M62.81);Unsteadiness on feet (R26.81)    Time: 1478-2956 PT Time Calculation (min) (ACUTE ONLY): 24 min   Charges:   PT Evaluation $PT Eval Moderate Complexity: 1 Mod PT Treatments $Gait Training: 8-22 mins PT General Charges $$ ACUTE PT VISIT: 1 Visit         Lyanne Co, PT  Acute Rehab Services Secure chat preferred Office [phone]   [place] L  Hoke Baer 02/01/2024, 11:47 AM

## 2024-02-01 NOTE — Discharge Instructions (Signed)
 [facility]  Please arrive at least 30 min before your appointment to complete your check in paperwork.  If you are unable to arrive 30 min prior to your appointment time we may have to cancel or reschedule you. LAPAROSCOPIC SURGERY: POST OP INSTRUCTIONS Always review your discharge instruction sheet given to you by the facility where your surgery was performed. IF YOU HAVE DISABILITY OR FAMILY LEAVE FORMS, YOU MUST BRING THEM TO THE OFFICE FOR PROCESSING.   DO NOT GIVE THEM TO YOUR DOCTOR.  PAIN CONTROL  First take acetaminophen (Tylenol) AND/or ibuprofen (Advil) to control your pain after surgery.  Follow directions on package.  Taking acetaminophen (Tylenol) and/or ibuprofen (Advil) regularly after surgery will help to control your pain and lower the amount of prescription pain medication you may need.  You should not take more than 4,000 mg (4 grams) of acetaminophen (Tylenol) in 24 hours.  You should not take ibuprofen (Advil), aleve, motrin, naprosyn or other NSAIDS if you have a history of stomach ulcers or chronic kidney disease.  A prescription for pain medication may be given to you upon discharge.  Take your pain medication as prescribed, if you still have uncontrolled pain after taking acetaminophen (Tylenol) or ibuprofen (Advil). Use ice packs to help control pain. If you need a refill on your pain medication, please contact your pharmacy.  They will contact our office to request authorization. Prescriptions will not be filled after 5pm or on week-ends.  HOME MEDICATIONS Take your usually prescribed medications unless otherwise directed.  DIET You should follow a light diet the first few days after arrival home.  Be sure to include lots of fluids daily. Avoid fatty, fried foods.   CONSTIPATION It is common to experience some constipation after surgery and if you are taking pain medication.  Increasing fluid intake and taking a stool softener (such as Colace)  will usually help or prevent this problem from occurring.  A mild laxative (Milk of Magnesia or Miralax) should be taken according to package instructions if there are no bowel movements after 48 hours.  WOUND/INCISION CARE Most patients will experience some swelling and bruising in the area of the incisions.  Ice packs will help.  Swelling and bruising can take several days to resolve.  Unless discharge instructions indicate otherwise, follow guidelines below  STERI-STRIPS - you may remove your outer bandages 48 hours after surgery, and you may shower at that time.  You have steri-strips (small skin tapes) in place directly over the incision.  These strips should be left on the skin for 7-10 days.   DERMABOND/SKIN GLUE - you may shower in 24 hours.  The glue will flake off over the next 2-3 weeks. Any sutures or staples will be removed at the office during your follow-up visit.  ACTIVITIES You may resume regular (light) daily activities beginning the next day--such as daily self-care, walking, climbing stairs--gradually increasing activities as tolerated.  You may have sexual intercourse when it is comfortable.  Refrain from any heavy lifting or straining until approved by your doctor. You may drive when you are no longer taking prescription pain medication, you can comfortably wear a seatbelt, and you can safely maneuver your car and apply brakes.  FOLLOW-UP You should see your doctor in the office for a follow-up appointment approximately 2-3 weeks after your surgery.  You should have been given your post-op/follow-up appointment when your surgery was scheduled.  If you did not receive a post-op/follow-up appointment, make sure  that you call for this appointment within a day or two after you arrive home to insure a convenient appointment time.   WHEN TO CALL YOUR DOCTOR: Fever over 101.0 Inability to urinate Continued bleeding from incision. Increased pain, redness, or drainage from the  incision. Increasing abdominal pain  The clinic staff is available to answer your questions during regular business hours.  Please don't hesitate to call and ask to speak to one of the nurses for clinical concerns.  If you have a medical emergency, go to the nearest emergency room or call 911.  A surgeon from [facility] is always on call at the hospital. [address], [address], [place], [place]  [address] ? [address] [address], [place], [place]   [address] [phone] ? [phone] ? FAX [phone]

## 2024-02-01 NOTE — Care Management Important Message (Signed)
 Important Message  Patient Details  Name: [name] [name] MRN: [id number] Date of Birth: 04/01/1956   Important Message Given:  Yes - Medicare IM     Dorena Bodo 02/01/2024, 12:49 PM

## 2024-02-02 DIAGNOSIS — K831 Obstruction of bile duct: Secondary | ICD-10-CM | POA: Diagnosis not present

## 2024-02-02 LAB — COMPREHENSIVE METABOLIC PANEL WITH GFR
ALT: 40 U/L (ref 0–44)
AST: 14 U/L — ABNORMAL LOW (ref 15–41)
Albumin: 2.6 g/dL — ABNORMAL LOW (ref 3.5–5.0)
Alkaline Phosphatase: 109 U/L (ref 38–126)
Anion gap: 11 (ref 5–15)
BUN: 5 mg/dL — ABNORMAL LOW (ref 8–23)
CO2: 26 mmol/L (ref 22–32)
Calcium: 8.9 mg/dL (ref 8.9–10.3)
Chloride: 102 mmol/L (ref 98–111)
Creatinine, Ser: 0.54 mg/dL (ref 0.44–1.00)
GFR, Estimated: >60 mL/min (ref >60)
Glucose, Bld: 90 mg/dL (ref 70–99)
Potassium: 3.4 mmol/L — ABNORMAL LOW (ref 3.5–5.1)
Sodium: 139 mmol/L (ref 135–145)
Total Bilirubin: 1 mg/dL (ref 0.0–1.2)
Total Protein: 5.6 g/dL — ABNORMAL LOW (ref 6.5–8.1)

## 2024-02-02 LAB — CBC
HCT: 39.1 % (ref 36.0–46.0)
Hemoglobin: 13.5 g/dL (ref 12.0–15.0)
MCH: 30.7 pg (ref 26.0–34.0)
MCHC: 34.5 g/dL (ref 30.0–36.0)
MCV: 88.9 fL (ref 80.0–100.0)
Platelets: 133 10*3/uL — ABNORMAL LOW (ref 150–400)
RBC: 4.4 MIL/uL (ref 3.87–5.11)
RDW: 12.7 % (ref 11.5–15.5)
WBC: 5.8 10*3/uL (ref 4.0–10.5)
nRBC: 0 % (ref 0.0–0.2)

## 2024-02-02 LAB — ALDOSTERONE + RENIN ACTIVITY W/ RATIO
ALDO / PRA Ratio: UNDETERMINED
Aldosterone: <1 ng/dL (ref 0.0–30.0)
PRA LC/MS/MS: <0.167 ng/mL/h — ABNORMAL LOW (ref 0.167–5.380)

## 2024-02-02 LAB — MAGNESIUM: Magnesium: 2.1 mg/dL (ref 1.7–2.4)

## 2024-02-02 MED ORDER — GLYCERIN (LAXATIVE) 2 G RE SUPP
1.0000 | Freq: Once | RECTAL | Status: AC
  Administered 2024-02-02: 1 via RECTAL
  Filled 2024-02-02: qty 1

## 2024-02-02 MED ORDER — POTASSIUM CHLORIDE CRYS ER 20 MEQ PO TBCR
40.0000 meq | EXTENDED_RELEASE_TABLET | Freq: Once | ORAL | Status: AC
  Administered 2024-02-02: 40 meq via ORAL
  Filled 2024-02-02: qty 2

## 2024-02-02 MED ORDER — LACTULOSE 10 GM/15ML PO SOLN
10.0000 g | Freq: Once | ORAL | Status: AC
  Administered 2024-02-02: 10 g via ORAL
  Filled 2024-02-02: qty 15

## 2024-02-02 MED ORDER — OXYCODONE HCL 5 MG PO TABS: 5.0000 mg | ORAL_TABLET | Freq: Four times a day (QID) | ORAL | 0 refills | Status: DC | PRN

## 2024-02-02 NOTE — Progress Notes (Signed)
 Physical Therapy Treatment Patient Details Name: [name] [name] MRN: [id number] DOB: November 28, 1955 Today's Date: 02/02/2024   History of Present Illness Pt is a 68 YO F admitted 01/30/24 with severe abdominal pain and underwent laparoscopic cholecystectomy 3/26 for gallstone pancreatitis. Admitted 01/08/24 with resp failure and flu A. PMH: Anxiety, Asthma, GERD, COPD, Depression, Obesity, Sleep apnea, home O2.    PT Comments  Pt admitted with above diagnosis. Pt accepts min challenges to balance with use of RW. Pt without LOB and good stability with RW.  Discussed f/u with pt and pt doesn't feel she needs HHPT and this PT agrees.  She is approaching her baseline and states she understands how to progress herself.  Pt is anxious to go home after she has a BM.  Will continue to follow acutely.  Pt currently with functional limitations due to the deficits listed below (see PT Problem List). Pt will benefit from acute skilled PT to increase their independence and safety with mobility to allow discharge.       If plan is discharge home, recommend the following: Assistance with cooking/housework;Assist for transportation   Can travel by private vehicle        Equipment Recommendations  None recommended by PT    Recommendations for Other Services       Precautions / Restrictions Precautions Precautions: Fall Recall of Precautions/Restrictions: Intact Restrictions Weight Bearing Restrictions Per Provider Order: No     Mobility  Bed Mobility Overal bed mobility: Needs Assistance Bed Mobility: Supine to Sit     Supine to sit: Supervision          Transfers Overall transfer level: Needs assistance Equipment used: Rolling walker (2 wheels) Transfers: Sit to/from Stand Sit to Stand: Supervision           General transfer comment: for safety    Ambulation/Gait Ambulation/Gait assistance: Contact guard assist Gait Distance (Feet): 125 Feet Assistive device: Rolling walker  (2 wheels) Gait Pattern/deviations: Step-through pattern, Decreased stride length Gait velocity: decreased Gait velocity interpretation: 1.31 - 2.62 ft/sec, indicative of limited community ambulator   General Gait Details: slow gait due to pain but pt reported she actually felt better as she kept going and pace increased. Good safety with ambulation with RW.  Pt wants to have a BM.  SPO2 95% on RA   Stairs             Wheelchair Mobility     Tilt Bed    Modified Rankin (Stroke Patients Only)       Balance Overall balance assessment: Needs assistance Sitting-balance support: No upper extremity supported, Feet supported Sitting balance-Leahy Scale: Good     Standing balance support: Bilateral upper extremity supported, No upper extremity supported, During functional activity Standing balance-Leahy Scale: Fair Standing balance comment: relies on RW dynamically                            Communication Communication Communication: No apparent difficulties  Cognition Arousal: Alert Behavior During Therapy: WFL for tasks assessed/performed   PT - Cognitive impairments: Memory                       PT - Cognition Comments: definite STM deficits. Does not remember things from beginning of session by the end. Unsure of whether or not this is her baseline Following commands: Intact      Cueing Cueing Techniques: Verbal cues  Exercises General Exercises -  Lower Extremity Long Arc Quad: AROM, Both, 5 reps, Seated    General Comments        Pertinent Vitals/Pain Pain Assessment Pain Assessment: Faces Faces Pain Scale: Hurts little more Pain Location: abd Pain Descriptors / Indicators: Discomfort, Operative site guarding Pain Intervention(s): Limited activity within patient's tolerance, Monitored during session, Repositioned    Home Living                          Prior Function            PT Goals (current goals can now be  found in the care plan section) Progress towards PT goals: Progressing toward goals    Frequency    Min 3X/week      PT Plan      Co-evaluation              AM-PAC PT "6 Clicks" Mobility   Outcome Measure  Help needed turning from your back to your side while in a flat bed without using bedrails?: A Little Help needed moving from lying on your back to sitting on the side of a flat bed without using bedrails?: A Little Help needed moving to and from a bed to a chair (including a wheelchair)?: A Little Help needed standing up from a chair using your arms (e.g., wheelchair or bedside chair)?: A Little Help needed to walk in hospital room?: A Little Help needed climbing 3-5 steps with a railing? : A Little 6 Click Score: 18    End of Session Equipment Utilized During Treatment: Gait belt Activity Tolerance: Patient limited by pain;Patient limited by fatigue Patient left: with call bell/phone within reach;in bed Nurse Communication: Mobility status PT Visit Diagnosis: History of falling (Z91.81);Difficulty in walking, not elsewhere classified (R26.2);Muscle weakness (generalized) (M62.81);Unsteadiness on feet (R26.81)     Time: 9147-8295 PT Time Calculation (min) (ACUTE ONLY): 11 min  Charges:    $Gait Training: 8-22 mins PT General Charges $$ ACUTE PT VISIT: 1 Visit                     Abdallah Hern M,PT Acute Rehab Services [phone]    Bevelyn Buckles 02/02/2024, 3:32 PM

## 2024-02-02 NOTE — Progress Notes (Signed)
 Mobility Specialist Progress Note:   02/02/24 1213  Mobility  Activity Ambulated with assistance in hallway  Level of Assistance  (MinG)  Assistive Device Front wheel walker  Distance Ambulated (ft) 100 ft  Activity Response Tolerated well  Mobility Referral Yes  Mobility visit 1 Mobility  Mobility Specialist Start Time (ACUTE ONLY) 1115  Mobility Specialist Stop Time (ACUTE ONLY) 1123  Mobility Specialist Time Calculation (min) (ACUTE ONLY) 8 min   During Mobility: 87 HR , 94% SpO2 RA  Pt received in BR, agreeable to mobility. VSS on RA. Pt c/o abdominal discomfort , otherwise asx throughout. Pt left in bed with call bell in reach and all needs met.   [name] [name]  Mobility Specialist Please contact via [employer] office at [phone]

## 2024-02-02 NOTE — Progress Notes (Signed)
  Overnight events: None  Subjective: Sitting up in chair. Still not passing gas, no bowel movements. Eating breakfast slowly, tolerating it. Feels better than yesterday.   Objective:  Vital signs in last 24 hours: Vitals:   02/01/24 0737 02/01/24 2039 02/02/24 0523 02/02/24 0718  BP: (!) 108/59 (!) 106/57 (!) 110/56 118/61  Pulse: 61 66 (!) 59 64  Resp:  16 16 16   Temp: 98.4 F (36.9 C) 98.6 F (37 C) 98.3 F (36.8 C) 98 F (36.7 C)  TempSrc:      SpO2: 92% 93% 92% 93%  Weight:      Height:       Physical Exam: Constitutional: Sitting up in chair eating breakfast Cardio: Regular rate and rhythm, euvolemic Pulm: Normal rate and effort Abdomen: soft, diffusely tender to light palpation, no bowel sounds  Skin: No lesions or skin changes Neuro: Alert and oriented x 3, no focal deficits Psych: Normal mood and affect  Labs: Mild hypokalemia Alk phos normal LFTs improved  Assessment/Plan: Principal Problem:   Common biliary duct stricture Active Problems:   COPD (chronic obstructive pulmonary disease) (HCC)   Thrombocytopenia (HCC)   Cholelithiasis   Pancreatitis, acute   Elevated LFTs  Post-op ileus Tolerating slow advance of diet. Up and out of bed. Still having abdominal pain, mild improvement from yesterday. No flatus or bowel movements yet.  - Senna and miralax; will try glycerin enema, lactulose - PT/OT; encouraged to get up and ambulating - Regular diet  Gallstone pancreatitis, s/p cholecystectomy Common biliary duct stricture, inflammatory Still has significant abdominal pain, mild improvement from yesterday. Nausea is better. No emesis. Surgery signed off.  - zofran q8h prn - Oxycodone q4h prn for moderate pain, dilaudid 0.5 mg q4h prn for breakthrough pain - Robaxin 500 mg q4h  Mild hypokalemia Potassium 3.4. Repleted with 40 mEq. Will recheck tomorrow.   Chronic conditions: COPD/Chronic hypoxic respiratory failure on 3L [place]: At baseline. Continue  home meds and duonebs PRN. Outpatient PFTs.  Thrombocytopenia: Stable, chronic. Anxiety/Depression: Zoloft 50 mg daily. Continue Xanax 1 mg nightly.  Insomnia: Trazodone nightly GERD: PPI Obesity: On ozempic, held.   Diet: Regular IVF: None VTE: Enoxaparin Code: DNR-Limited   Dispo: Anticipated discharge to  Home with [facility] pending return of bowel function  Annett Fabian, MD 02/02/2024, 3:51 PM Pager: [phone] After 5pm on weekdays and 1pm on weekends: On Call pager [phone]

## 2024-02-02 NOTE — Progress Notes (Addendum)
 2 Days Post-Op  Subjective: Pain well controlled.  Mobilizing better today.  Tolerating her diet.    Objective: Vital signs in last 24 hours: Temp:  [98 F (36.7 C)-98.6 F (37 C)] 98 F (36.7 C) (03/28 0718) Pulse Rate:  [59-66] 64 (03/28 0718) Resp:  [16] 16 (03/28 0718) BP: (106-118)/(56-61) 118/61 (03/28 0718) SpO2:  [92 %-93 %] 93 % (03/28 0718) Last BM Date : 01/27/24 (Constipation interventions)  Intake/Output from previous day: 03/27 0701 - 03/28 0700 In: 489.6 [P.O.:360; I.V.:129.6] Out: 300 [Urine:300] Intake/Output this shift: No intake/output data recorded.  PE: Abd: soft, non-distended, appropriately tender, incisions c/d/i  Lab Results:  Recent Labs    01/31/24 0433 02/02/24 0430  WBC 5.3 5.8  HGB 13.7 13.5  HCT 39.6 39.1  PLT 112* 133*   BMET Recent Labs    02/01/24 0442 02/02/24 0430  NA 137 139  K 4.0 3.4*  CL 103 102  CO2 26 26  GLUCOSE 89 90  BUN 5* 5*  CREATININE 0.52 0.54  CALCIUM 9.0 8.9   PT/INR No results for input(s): "LABPROT", "INR" in the last 72 hours.  CMP     Component Value Date/Time   NA 139 02/02/2024 0430   NA 139 08/07/2023 1456   K 3.4 (L) 02/02/2024 0430   CL 102 02/02/2024 0430   CO2 26 02/02/2024 0430   GLUCOSE 90 02/02/2024 0430   BUN 5 (L) 02/02/2024 0430   BUN 6 (L) 08/07/2023 1456   CREATININE 0.54 02/02/2024 0430   CREATININE 0.55 05/23/2014 1050   CALCIUM 8.9 02/02/2024 0430   PROT 5.6 (L) 02/02/2024 0430   PROT 6.0 08/07/2023 1456   ALBUMIN 2.6 (L) 02/02/2024 0430   ALBUMIN 3.9 08/07/2023 1456   AST 14 (L) 02/02/2024 0430   ALT 40 02/02/2024 0430   ALKPHOS 109 02/02/2024 0430   BILITOT 1.0 02/02/2024 0430   BILITOT 0.3 08/07/2023 1456   GFRNONAA >60 02/02/2024 0430   GFRNONAA >89 05/23/2014 1050   GFRAA >60 06/03/2020 1149   GFRAA >89 05/23/2014 1050   Lipase     Component Value Date/Time   LIPASE 798 (H) 01/28/2024 2305    Studies/Results: DG Cholangiogram Operative Result  Date: 01/31/2024 CLINICAL DATA:  Elective surgery.  Cholelithiasis. EXAM: INTRAOPERATIVE CHOLANGIOGRAM TECHNIQUE: Cholangiographic images from the C-arm fluoroscopic device were submitted for interpretation post-operatively. Radiation Exposure Index (as provided by the fluoroscopic device): 5.71 mGy Kerma COMPARISON:  MRI 01/29/2024 FINDINGS: Contrast extravasation in the right upper abdomen and minimal filling of the biliary system. Increased opacification of the biliary system on the second set images. The common bile duct is dilated but there is no contrast identified in the duodenum. IMPRESSION: 1. Limited examination due to large amount of contrast extravasation likely related to difficulties with cannulation. 2. Opacified biliary system is dilated without filling of the duodenum. Cannot exclude a distal common bile duct obstruction. Electronically Signed   By: Richarda Overlie M.D.   On: 01/31/2024 17:40    Anti-infectives: Anti-infectives (From admission, onward)    Start     Dose/Rate Route Frequency Ordered Stop   01/31/24 0900  ceFAZolin (ANCEF) IVPB 2g/100 mL premix        2 g 200 mL/hr over 30 Minutes Intravenous On call to O.R. 01/31/24 0802 01/31/24 1215   01/31/24 0806  ceFAZolin (ANCEF) 2-4 GM/100ML-% IVPB       Note to Pharmacy: Lurena Nida: cabinet override      01/31/24 0806 01/31/24  1232       Assessment/Plan POD 2, lap chole with IOC Dr. Hillery Hunter 3/26 for gallstone pancreatitis and cholecystitis - doing well surgically -pain controlled and appropriate -mobilizing well -tolerating diet -d/w primary service  Stable for discharge home from surgical standpoint.  Rx for oxy has been sent to pharmacy and follow up made.  She does not need to have a BM prior to discharge.  We discussed all the OTC things she can use to help move her bowels.  FEN: reg ID: [id number] periop VTE: lovenox   Letha Cape PA - [facility] 02/02/2024, 8:59 AM Please see Amion for pager  number during day hours 7:00am-4:30pm or 7:00am -11:30am on weekends

## 2024-02-02 NOTE — Discharge Summary (Signed)
 Name: Edyth Glomb MRN: [id number] DOB: [birth year] 68 y.o. PCP: Rudene Christians, DO  Date of Admission: 01/28/2024  7:36 PM Date of Discharge:  02/04/2024 Attending Physician: Dr. Antony Contras  DISCHARGE DIAGNOSIS:  Primary Problem: Status post laparoscopic cholecystectomy   Hospital Problems: Principal Problem:   Status post laparoscopic cholecystectomy Active Problems:   COPD (chronic obstructive pulmonary disease) (HCC)   Thrombocytopenia (HCC)   Cholelithiasis   Pancreatitis, acute   Elevated LFTs   Common biliary duct stricture   Ileus (HCC)   DISCHARGE MEDICATIONS:   Allergies as of 02/04/2024       Reactions   Ibuprofen Anaphylaxis, Hives, Swelling   Throat swells   Clonazepam Other (See Comments)   Dizziness        Medication List     STOP taking these medications    Ozempic (1 MG/DOSE) 4 MG/3ML Sopn Generic drug: Semaglutide (1 MG/DOSE)       TAKE these medications    acetaminophen 500 MG tablet Commonly known as: TYLENOL Take 1,000 mg by mouth every 6 (six) hours as needed (for pain OR back pain).   ALPRAZolam 0.5 MG tablet Commonly known as: XANAX Take 0.5-1 mg by mouth at bedtime.   methocarbamol 500 MG tablet Commonly known as: ROBAXIN Take 1 tablet (500 mg total) by mouth 4 (four) times daily.   oxyCODONE 5 MG immediate release tablet Commonly known as: Oxy IR/ROXICODONE Take 1 tablet (5 mg total) by mouth every 6 (six) hours as needed.   pantoprazole 40 MG tablet Commonly known as: PROTONIX TAKE 1 TABLET(40 MG) BY MOUTH DAILY   polyethylene glycol powder 17 GM/SCOOP powder Commonly known as: GLYCOLAX/MIRALAX Dissolve 1 capful (17 g) in liquid and take by mouth 2 (two) times daily. (Take 17 g by mouth 2 (two) times daily.)   sertraline 100 MG tablet Commonly known as: ZOLOFT TAKE 1 TABLET BY MOUTH EVERY DAY   traZODone 150 MG tablet Commonly known as: DESYREL Take 1 tablet (150 mg total) by mouth at bedtime.    Ventolin HFA 108 (90 Base) MCG/ACT inhaler Generic drug: albuterol Inhale 2 puffs into the lungs every 6 (six) hours as needed for wheezing or shortness of breath.        DISPOSITION AND FOLLOW-UP:  Ms.Marsa Maness Malia was discharged from [facility] in Stable condition. At the hospital follow up visit please address:  Gallstone pancreatitis s/p laparoscopic cholecystectomy: Had mild post-op ileus. Reassess abdominal pain for improvement and return of bowel function. Ensure outpatient follow up with surgeon. Check laparoscopic wounds for healing. Recommend holding GLP-1s given pancreatitis. Recheck CMP.   COPD/Chronic hypoxic respiratory failure on 3L [place]: She satted well on room air throughout her hospitalization. Please reassess her need for home oxygen outpatient.  Follow-up Appointments:  Follow-up Information     Maczis, Hedda Slade, PA-C Follow up on 02/29/2024.   Specialty: General Surgery Why: 3:30pm, Arrive 30 minutes prior to your appointment time, Please bring your insurance card and photo ID Contact information: [address] [facility] [place] [address] [phone]         Care, [facility] Follow up.   Specialty: Home Health Services Why: Someone will call you to schedule bresumtion of care visit. Contact information: [address] [place] [address] [phone]                HOSPITAL COURSE:  Patient Summary: Gallstone pancreatitis, s/p cholecystectomy Common biliary duct  stricture, inflammatory Post-op ileus Presented with acute pancreatitis and cholestatic liver injury.  Right upper quadrant ultrasound showed cholelithiasis but no evidence of cholecystitis.  No alcohol use, triglycerides normal.  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  Likely gallstone pancreatitis with a passed stone.  Taken for laparoscopic  cholecystectomy on 3/26 with intraoperative cholangiogram. Surgical pathology showed chronic cholecystitis. Tolerated this procedure well.  Had 2 days of postop ileus, but was able to pass gas and have a bowel movement with resumption of a bowel regimen, ambulation, and advance of her diet.  Following her surgery, her LFTs and bilirubin returned normal.  Surgery signed off.  She is medically stable for discharge with close outpatient follow-up with her PCP and surgical team. Discharged with miralax, robaxin and a short course of oxycodone.    COPD/Chronic hypoxic respiratory failure on 3L [place] No concerns of exacerbation during her hospital stay.  She satted well on room air.  Thrombocytopenia Stable, chronic. Unclear etiology. 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   Anxiety/Depression Stable. On Zoloft 50 mg daily, Xanax 1 mg nightly.   Insomnia Trazodone nightly.  GERD Stable. On a daily PPI.   Obesity On ozempic prior to admission, held.    DISCHARGE INSTRUCTIONS:   Discharge Instructions     Call MD for:  difficulty breathing, headache or visual disturbances   Complete by: As directed    Call MD for:  extreme fatigue   Complete by: As directed    Call MD for:  persistant dizziness or light-headedness   Complete by: As directed    Call MD for:  persistant nausea and vomiting   Complete by: As directed    Call MD for:  redness, tenderness, or signs of infection (pain, swelling, redness, odor or green/yellow discharge around incision site)   Complete by: As directed    Call MD for:  severe uncontrolled pain   Complete by: As directed    Call MD for:  temperature >100.4   Complete by: As directed    Diet - low sodium heart healthy   Complete by: As directed    Discharge instructions   Complete by: As directed    It was a pleasure taking care of you at [facility].  You were admitted for acute pancreatitis, which was likely  caused by a gallstone.  You underwent surgery with removal of your gallbladder and are doing well in your recovery.  The surgical team has signed off and you are now medically stable for discharge with outpatient follow-up.  We have arranged follow-up for you with your primary care doctor in our internal medicine clinic.  You also have follow-up with the surgery team in their clinic.  Please note the following medication changes:  You may take Robaxin 500 mg up to 4 times daily as needed for abdominal pain.  You also may take oxycodone 5 mg every 6 hours as needed for pain.   Please take MiraLAX as needed for constipation.  Please stop taking Ozempic for now, as it is not recommended when someone has pancreatitis.  You can keep taking your other medications as prescribed.  If you have any severely worsening abdominal pain, persistent nausea or vomiting, lightheadedness or dizziness, please return to the emergency department for reevaluation.  Otherwise, please follow-up with your outpatient primary care doctor and surgeon.  Please call our clinic if you have any questions or concerns, we may  be able to help and keep you from a long and expensive emergency room wait. Our clinic and after hours phone number is [phone], the best time to call is Monday through Friday 9 am to 4 pm but there is always someone available 24/7 if you have an emergency. If you need medication refills please notify your pharmacy one week in advance and they will send [place] a request.   Increase activity slowly   Complete by: As directed        SUBJECTIVE:   Feels back near baseline. Having bowel movements. Abdominal pain improved. Ready for discharge.   Discharge Vitals:   BP 111/72 (BP Location: Left Arm)   Pulse 63   Temp 98.1 F (36.7 C)   Resp 18   Ht 5\' 8"  (1.727 m)   Wt 88.9 kg   SpO2 91%   BMI 29.80 kg/m   OBJECTIVE:  Physical Exam Constitutional:      Appearance: She is well-developed.   Eyes:     Extraocular Movements: Extraocular movements intact.     Pupils: Pupils are equal, round, and reactive to light.  Cardiovascular:     Rate and Rhythm: Normal rate and regular rhythm.     Pulses: Normal pulses.     Heart sounds: Normal heart sounds.  Pulmonary:     Effort: Pulmonary effort is normal.     Breath sounds: Normal breath sounds.     Comments: On room air Abdominal:     Palpations: Abdomen is soft.     Tenderness: There is abdominal tenderness.     Comments: Mild generalized tenderness to palpation, improving  Musculoskeletal:        General: Normal range of motion.  Skin:    General: Skin is warm and dry.  Neurological:     General: No focal deficit present.     Mental Status: She is alert and oriented to person, place, and time. Mental status is at baseline.  Psychiatric:        Mood and Affect: Mood normal.        Behavior: Behavior normal.     Pertinent Labs, Studies, and Procedures:     Latest Ref Rng & Units 02/02/2024    4:30 AM 01/31/2024    4:33 AM 01/30/2024    9:17 AM  CBC  WBC 4.0 - 10.5 K/uL 5.8  5.3  6.8   Hemoglobin 12.0 - 15.0 g/dL 13.0  86.5  78.4   Hematocrit 36.0 - 46.0 % 39.1  39.6  38.9   Platelets 150 - 400 K/uL 133  112  106        Latest Ref Rng & Units 02/04/2024    5:41 AM 02/03/2024    9:32 AM 02/02/2024    4:30 AM  CMP  Glucose 70 - 99 mg/dL 696  97  90   BUN 8 - 23 mg/dL 6  <5  5   Creatinine 2.95 - 1.00 mg/dL 2.84  1.32  4.40   Sodium 135 - 145 mmol/L 138  138  139   Potassium 3.5 - 5.1 mmol/L 3.9  4.0  3.4   Chloride 98 - 111 mmol/L 103  102  102   CO2 22 - 32 mmol/L 22  27  26    Calcium 8.9 - 10.3 mg/dL 9.1  9.4  8.9   Total Protein 6.5 - 8.1 g/dL 5.8  6.3  5.6   Total Bilirubin 0.0 - 1.2 mg/dL 0.9  1.0  1.0  Alkaline Phos 38 - 126 U/L 102  109  109   AST 15 - 41 U/L 9  12  14    ALT 0 - 44 U/L 24  31  40     MR 3D Recon At Scanner Result Date: 01/31/2024 IMPRESSION: 1. Diffuse edema and mild peripancreatic  soft tissue stranding involving the pancreas compatible with acute pancreatitis. No signs of pancreatic necrosis or pseudocyst. 2. Cholelithiasis without signs of acute cholecystitis. 3. The common bile duct measures 1.1 cm in maximum diameter. No significant intrahepatic bile duct dilatation. Abrupt narrowing of the distal CBD is identified just before the ampulla. No choledocholithiasis. Findings may reflect a inflammatory stricture. Consider further evaluation with ERCP. 4. Bilateral adrenal gland adenomas. No follow-up imaging recommended. 5. Aortic Atherosclerosis (ICD10-I70.0). Electronically Signed   By: Signa Kell M.D.   On: 01/31/2024 04:50   MR ABDOMEN MRCP W WO CONTAST Result Date: 01/29/2024 IMPRESSION: 1. Diffuse edema and mild peripancreatic soft tissue stranding involving the pancreas compatible with acute pancreatitis. No signs of pancreatic necrosis or pseudocyst. 2. Cholelithiasis without signs of acute cholecystitis. 3. The common bile duct measures 1.1 cm in maximum diameter. No significant intrahepatic bile duct dilatation. Abrupt narrowing of the distal CBD is identified just before the ampulla. No choledocholithiasis. Findings may reflect a inflammatory stricture. Consider further evaluation with ERCP. 4. Bilateral adrenal gland adenomas. No follow-up imaging recommended. 5. Aortic Atherosclerosis (ICD10-I70.0). Electronically Signed   By: Signa Kell M.D.   On: 01/29/2024 06:13   US Abdomen Limited RUQ (LIVER/GB) Result Date: 01/29/2024 IMPRESSION: Cholelithiasis without complicating factors. Fatty liver. Electronically Signed   By: Alcide Clever M.D.   On: 01/29/2024 01:53   DG Chest 2 View Result Date: 01/28/2024 IMPRESSION: No active cardiopulmonary disease. Electronically Signed   By: Alcide Clever M.D.   On: 01/28/2024 20:42     Signed: Annett Fabian, MD Internal Medicine Resident, PGY-1 [facility]  Pager: [phone] 8:17 AM,  02/04/2024

## 2024-02-02 NOTE — Progress Notes (Signed)
 Occupational Therapy Treatment Patient Details Name: [name] [name] MRN: [id number] DOB: 1956-02-08 Today's Date: 02/02/2024   History of present illness Pt is a 68 YO F admitted 01/30/24 with severe abdominal pain and underwent laparoscopic cholecystectomy 3/26 for gallstone pancreatitis. Admitted 01/08/24 with resp failure and flu A. PMH: Anxiety, Asthma, GERD, COPD, Depression, Obesity, Sleep apnea, home O2.   OT comments  Pt progressing well towards OT goals.  Improved speed of activities today, reports less pain.  She was educated on energy conservation and compensatory techniques for ADLs throughout the day.  Supervision for ADLs and functional mobility using RW, cueing for RW safety. Reports a friend may come to help her during the day initially.  Continue to recommend HHOT services and aide at dc.  Will follow.       If plan is discharge home, recommend the following:  A little help with walking and/or transfers;A little help with bathing/dressing/bathroom;Assistance with cooking/housework;Assist for transportation   Equipment Recommendations  None recommended by OT    Recommendations for Other Services      Precautions / Restrictions Precautions Precautions: Fall Recall of Precautions/Restrictions: Intact Restrictions Weight Bearing Restrictions Per Provider Order: No       Mobility Bed Mobility               General bed mobility comments: OOB in recliner upon entry    Transfers Overall transfer level: Needs assistance Equipment used: Rolling walker (2 wheels) Transfers: Sit to/from Stand Sit to Stand: Supervision           General transfer comment: for safety     Balance Overall balance assessment: Needs assistance Sitting-balance support: No upper extremity supported, Feet supported Sitting balance-Leahy Scale: Good     Standing balance support: Bilateral upper extremity supported, No upper extremity supported, During functional  activity Standing balance-Leahy Scale: Fair Standing balance comment: relies on RW dynamically                           ADL either performed or assessed with clinical judgement   ADL Overall ADL's : Needs assistance/impaired     Grooming: Supervision/safety;Standing;Wash/dry hands           Upper Body Dressing : Set up;Sitting   Lower Body Dressing: Supervision/safety;Sit to/from stand   Toilet Transfer: Supervision/safety;Ambulation;Rolling walker (2 wheels) Toilet Transfer Details (indicate cue type and reason): 3:1 over toilet         Functional mobility during ADLs: Supervision/safety;Rolling walker (2 wheels);Cueing for safety General ADL Comments: cueing for RW safety, improved pacing today. Reviewed compensatroy technqiues and energy conservation techniques (provided handout)    Extremity/Trunk Assessment              Vision       Perception     Praxis     Communication Communication Communication: No apparent difficulties   Cognition Arousal: Alert Behavior During Therapy: WFL for tasks assessed/performed Cognition: No family/caregiver present to determine baseline             OT - Cognition Comments: reviewed compensatory techniques for recall including using pill box, calender and writing down notes. Discussed only using microwave at this time.                 Following commands: Intact        Cueing   Cueing Techniques: Verbal cues  Exercises      Shoulder Instructions       General  Comments      Pertinent Vitals/ Pain       Pain Assessment Pain Assessment: 0-10 Pain Score: 6  Pain Location: abd Pain Descriptors / Indicators: Discomfort, Operative site guarding, Sharp, Stabbing Pain Intervention(s): Limited activity within patient's tolerance, Monitored during session, Repositioned  Home Living                                          Prior Functioning/Environment               Frequency  Min 2X/week        Progress Toward Goals  OT Goals(current goals can now be found in the care plan section)  Progress towards OT goals: Progressing toward goals  Acute Rehab OT Goals Patient Stated Goal: home OT Goal Formulation: With patient Time For Goal Achievement: 02/15/24 Potential to Achieve Goals: Good  Plan      Co-evaluation                 AM-PAC OT "6 Clicks" Daily Activity     Outcome Measure   Help from another person eating meals?: None Help from another person taking care of personal grooming?: A Little Help from another person toileting, which includes using toliet, bedpan, or urinal?: A Little Help from another person bathing (including washing, rinsing, drying)?: A Little Help from another person to put on and taking off regular upper body clothing?: A Little Help from another person to put on and taking off regular lower body clothing?: A Little 6 Click Score: 19    End of Session Equipment Utilized During Treatment: Rolling walker (2 wheels)  OT Visit Diagnosis: Unsteadiness on feet (R26.81);Muscle weakness (generalized) (M62.81);Pain Pain - part of body:  (abd)   Activity Tolerance Patient tolerated treatment well   Patient Left in chair;with call bell/phone within reach   Nurse Communication Mobility status        Time: 8295-6213 OT Time Calculation (min): 16 min  Charges: OT General Charges $OT Visit: 1 Visit OT Treatments $Self Care/Home Management : 8-22 mins  Barry Brunner, OT Acute Rehabilitation Services Office [phone] Secure Chat Preferred    Chancy Milroy 02/02/2024, 9:19 AM

## 2024-02-02 NOTE — Plan of Care (Signed)
  Problem: Elimination: Goal: Will not experience complications related to bowel motility Outcome: Not Progressing   Problem: Education: Goal: Knowledge of General Education information will improve Description: Including pain rating scale, medication(s)/side effects and non-pharmacologic comfort measures Outcome: Adequate for Discharge   Problem: Health Behavior/Discharge Planning: Goal: Ability to manage health-related needs will improve Outcome: Adequate for Discharge   Problem: Clinical Measurements: Goal: Ability to maintain clinical measurements within normal limits will improve Outcome: Adequate for Discharge

## 2024-02-02 NOTE — Plan of Care (Signed)

## 2024-02-02 NOTE — Progress Notes (Signed)
 [facility] Attending  Case discussed with the resident at the time of the visit.  We reviewed the resident's history and exam and pertinent patient test results.  I agree with the assessment, diagnosis, and plan of care documented in the resident's note.

## 2024-02-03 ENCOUNTER — Inpatient Hospital Stay (HOSPITAL_COMMUNITY)

## 2024-02-03 ENCOUNTER — Other Ambulatory Visit (HOSPITAL_COMMUNITY): Payer: Self-pay

## 2024-02-03 DIAGNOSIS — K567 Ileus, unspecified: Secondary | ICD-10-CM

## 2024-02-03 DIAGNOSIS — Z9049 Acquired absence of other specified parts of digestive tract: Secondary | ICD-10-CM

## 2024-02-03 DIAGNOSIS — K831 Obstruction of bile duct: Secondary | ICD-10-CM | POA: Diagnosis not present

## 2024-02-03 LAB — COMPREHENSIVE METABOLIC PANEL WITH GFR
ALT: 31 U/L (ref 0–44)
AST: 12 U/L — ABNORMAL LOW (ref 15–41)
Albumin: 3 g/dL — ABNORMAL LOW (ref 3.5–5.0)
Alkaline Phosphatase: 109 U/L (ref 38–126)
Anion gap: 9 (ref 5–15)
BUN: <5 mg/dL — ABNORMAL LOW (ref 8–23)
CO2: 27 mmol/L (ref 22–32)
Calcium: 9.4 mg/dL (ref 8.9–10.3)
Chloride: 102 mmol/L (ref 98–111)
Creatinine, Ser: 0.56 mg/dL (ref 0.44–1.00)
GFR, Estimated: >60 mL/min (ref >60)
Glucose, Bld: 97 mg/dL (ref 70–99)
Potassium: 4 mmol/L (ref 3.5–5.1)
Sodium: 138 mmol/L (ref 135–145)
Total Bilirubin: 1 mg/dL (ref 0.0–1.2)
Total Protein: 6.3 g/dL — ABNORMAL LOW (ref 6.5–8.1)

## 2024-02-03 LAB — PHOSPHORUS: Phosphorus: 3.8 mg/dL (ref 2.5–4.6)

## 2024-02-03 MED ORDER — IOHEXOL 9 MG/ML PO SOLN
500.0000 mL | ORAL | Status: AC
  Administered 2024-02-03 (×2): 500 mL via ORAL

## 2024-02-03 MED ORDER — IOHEXOL 9 MG/ML PO SOLN
ORAL | Status: AC
  Filled 2024-02-03: qty 1000

## 2024-02-03 MED ORDER — IOHEXOL 350 MG/ML SOLN
75.0000 mL | Freq: Once | INTRAVENOUS | Status: AC | PRN
  Administered 2024-02-03: 75 mL via INTRAVENOUS

## 2024-02-03 MED ORDER — SORBITOL 70 % SOLN
15.0000 mL | Freq: Once | Status: AC
  Administered 2024-02-03: 15 mL via ORAL
  Filled 2024-02-03: qty 30

## 2024-02-03 MED ORDER — METHOCARBAMOL 500 MG PO TABS
500.0000 mg | ORAL_TABLET | Freq: Four times a day (QID) | ORAL | 0 refills | Status: AC
  Filled 2024-02-03: qty 120, 30d supply, fill #0

## 2024-02-03 MED ORDER — POLYETHYLENE GLYCOL 3350 17 GM/SCOOP PO POWD
17.0000 g | Freq: Two times a day (BID) | ORAL | 0 refills | Status: AC
  Filled 2024-02-03: qty 238, 7d supply, fill #0

## 2024-02-03 NOTE — Progress Notes (Addendum)
 HD#5 Subjective:   Summary: 68 year old female with a past medical history of COPD, anxiety, depression who presented for concerns of epigastric pain, found to have gallstone pancreatitis, now status post laparoscopic cholecystectomy.  Course was complicated by postop ileus.  Overnight Events: No acute events overnight  Patient evaluated at bedside this morning. Reports she passed flatus a little, no bowel movement. Reports being uncomfortable and abdominal tenderness. Some nausea, no vomiting.   Objective:  Vital signs in last 24 hours: Vitals:   02/02/24 1627 02/02/24 2011 02/02/24 2040 02/03/24 0537  BP: 120/64 130/74  120/65  Pulse: 66 64  67  Resp: 16 18  18   Temp: 99 F (37.2 C) 98.1 F (36.7 C)  98 F (36.7 C)  TempSrc:      SpO2: 93% (!) 89% 94% 94%  Weight:      Height:       Supplemental O2: Room Air SpO2: 94 %   Physical Exam:  Constitutional: Sitting in bed, no acute distress HENT: normocephalic atraumatic Cardio: Regular rate and rhythm, no murmurs, rubs, gallops Lungs: Clear to auscultation bilaterally Abdomen: Soft, tenderness appreciated, hyperactive bowel sounds, surgical incisions healing well  Filed Weights   01/28/24 1958 01/31/24 0821  Weight: 88.9 kg 88.9 kg    No intake or output data in the 24 hours ending 02/03/24 0616 Net IO Since Admission: 3,150.6 mL [02/03/24 0616]  Pertinent Labs:    Latest Ref Rng & Units 02/02/2024    4:30 AM 01/31/2024    4:33 AM 01/30/2024    9:17 AM  CBC  WBC 4.0 - 10.5 K/uL 5.8  5.3  6.8   Hemoglobin 12.0 - 15.0 g/dL 60.4  54.0  98.1   Hematocrit 36.0 - 46.0 % 39.1  39.6  38.9   Platelets 150 - 400 K/uL 133  112  106        Latest Ref Rng & Units 02/02/2024    4:30 AM 02/01/2024    4:42 AM 01/31/2024    4:33 AM  CMP  Glucose 70 - 99 mg/dL 90  89  84   BUN 8 - 23 mg/dL 5  5  <5   Creatinine 1.91 - 1.00 mg/dL 4.78  2.95  6.21   Sodium 135 - 145 mmol/L 139  137  142   Potassium 3.5 - 5.1 mmol/L 3.4   4.0  4.4   Chloride 98 - 111 mmol/L 102  103  106   CO2 22 - 32 mmol/L 26  26  29    Calcium 8.9 - 10.3 mg/dL 8.9  9.0  9.3   Total Protein 6.5 - 8.1 g/dL 5.6  5.7  5.6   Total Bilirubin 0.0 - 1.2 mg/dL 1.0  1.0  0.8   Alkaline Phos 38 - 126 U/L 109  137  140   AST 15 - 41 U/L 14  20  15    ALT 0 - 44 U/L 40  55  79     Imaging: No results found.  Assessment/Plan:   Principal Problem:   Common biliary duct stricture Active Problems:   COPD (chronic obstructive pulmonary disease) (HCC)   Thrombocytopenia (HCC)   Cholelithiasis   Pancreatitis, acute   Elevated LFTs   Patient Summary: [name] [name] is a 68 y.o. female with a past medical history of COPD, anxiety, depression who presented for concerns of epigastric pain, found to have gallstone pancreatitis, now status post laparoscopic cholecystectomy.  Course was complicated by postop ileus.  #  Postop ileus Yesterday gave the patient both lactulose as well as glycerin suppository.  Patient is slowly started having some gas.  She reports having some nausea, but no vomiting.  She denies any distention.  She states she is not having much flatus.  KUB obtained today showing mild ileus.  Will need to keep an eye on this. Obtain CMP.  -Continue senna and MiraLAX -Try lactulose again if needed -Encourage out of bed and ambulation -N.p.o. for now  #Gallstone pancreatitis s/p cholecystectomy Improving well.  Abdominal pain has improved.  Surgery signed off. -Pain control with oxycodone -Antiemetics with Zofran  #Hypokalemia Repleted yesterday.  Will follow-up today.  Chronic conditions: COPD/Chronic hypoxic respiratory failure on 3L [place]: At baseline. Continue home meds and duonebs PRN. Outpatient PFTs.  Thrombocytopenia: Stable, chronic. Anxiety/Depression: Zoloft 50 mg daily. Continue Xanax 1 mg nightly.  Insomnia: Trazodone nightly GERD: PPI Obesity: On ozempic, held and will likely be held at discharge   Diet:  Normal IVF: None,None VTE: Enoxaparin Code: DNR PT/OT recs: None, none.  Dispo: Anticipated discharge to Home in 1 days pending clinical improvement.   Modena Slater DO Internal Medicine Resident PGY-2 [phone] Please contact the on call pager after 5 pm and on weekends at [phone].

## 2024-02-03 NOTE — Plan of Care (Signed)

## 2024-02-03 NOTE — Plan of Care (Signed)

## 2024-02-03 NOTE — Progress Notes (Signed)
 Mobility Specialist Progress Note:    02/03/24 1210  Mobility  Activity Ambulated with assistance in hallway  Level of Assistance Standby assist, set-up cues, supervision of patient - no hands on  Assistive Device Front wheel walker  Distance Ambulated (ft) 100 ft  Activity Response Tolerated well  Mobility Referral Yes  Mobility visit 1 Mobility  Mobility Specialist Start Time (ACUTE ONLY) 1026  Mobility Specialist Stop Time (ACUTE ONLY) 1038  Mobility Specialist Time Calculation (min) (ACUTE ONLY) 12 min   Received pt in bed having no complaints and agreeable to mobility. Pt was asymptomatic throughout ambulation and returned to room w/o fault. Left in bed w/ call bell in reach and all needs met.  Thompson Grayer Mobility Specialist  Please contact vis Secure Chat or  Rehab Office [phone]

## 2024-02-04 LAB — COMPREHENSIVE METABOLIC PANEL WITH GFR
ALT: 24 U/L (ref 0–44)
AST: 9 U/L — ABNORMAL LOW (ref 15–41)
Albumin: 2.7 g/dL — ABNORMAL LOW (ref 3.5–5.0)
Alkaline Phosphatase: 102 U/L (ref 38–126)
Anion gap: 13 (ref 5–15)
BUN: 6 mg/dL — ABNORMAL LOW (ref 8–23)
CO2: 22 mmol/L (ref 22–32)
Calcium: 9.1 mg/dL (ref 8.9–10.3)
Chloride: 103 mmol/L (ref 98–111)
Creatinine, Ser: 0.57 mg/dL (ref 0.44–1.00)
GFR, Estimated: >60 mL/min (ref >60)
Glucose, Bld: 101 mg/dL — ABNORMAL HIGH (ref 70–99)
Potassium: 3.9 mmol/L (ref 3.5–5.1)
Sodium: 138 mmol/L (ref 135–145)
Total Bilirubin: 0.9 mg/dL (ref 0.0–1.2)
Total Protein: 5.8 g/dL — ABNORMAL LOW (ref 6.5–8.1)

## 2024-02-04 NOTE — Plan of Care (Signed)
  Problem: Education: Goal: Knowledge of General Education information will improve Description: Including pain rating scale, medication(s)/side effects and non-pharmacologic comfort measures Outcome: Adequate for Discharge   Problem: Education: Goal: Knowledge of General Education information will improve Description: Including pain rating scale, medication(s)/side effects and non-pharmacologic comfort measures Outcome: Adequate for Discharge   Problem: Health Behavior/Discharge Planning: Goal: Ability to manage health-related needs will improve Outcome: Adequate for Discharge   Problem: Clinical Measurements: Goal: Ability to maintain clinical measurements within normal limits will improve Outcome: Adequate for Discharge Goal: Will remain free from infection Outcome: Adequate for Discharge Goal: Diagnostic test results will improve Outcome: Adequate for Discharge Goal: Respiratory complications will improve Outcome: Adequate for Discharge Goal: Cardiovascular complication will be avoided Outcome: Adequate for Discharge   

## 2024-02-04 NOTE — TOC Transition Note (Signed)
 Transition of Care [facility]) - Discharge Note   Patient Details  Name: [name] [name] MRN: [id number] Date of Birth: Jan 29, 1956  Transition of Care [facility]) CM/SW Contact:  Lawerance Sabal, RN Phone Number: 02/04/2024, 8:45 AM   Clinical Narrative:      [facility] that patient will discharge today    Barriers to Discharge: Continued Medical Work up   Patient Goals and CMS Choice Patient states their goals for this hospitalization and ongoing recovery are:: return home with home health CMS Medicare.gov Compare Post Acute Care list provided to:: Patient Choice offered to / list presented to : Patient      Discharge Placement                       Discharge Plan and Services Additional resources added to the After Visit Summary for                              [facility] Agency: [facility] Date [facility] Agency Contacted: 01/29/24 Time HH Agency Contacted: 1332 Representative spoke with at [facility] Agency: Kandee Keen  Social Drivers of Health (SDOH) Interventions SDOH Screenings   Food Insecurity: No Food Insecurity (01/29/2024)  Housing: Low Risk  (01/29/2024)  Transportation Needs: No Transportation Needs (01/29/2024)  Utilities: Not At Risk (01/29/2024)  Alcohol Screen: Low Risk  (12/07/2022)  Depression (PHQ2-9): Low Risk  (01/23/2024)  Financial Resource Strain: Medium Risk (12/07/2022)  Physical Activity: Insufficiently Active (12/07/2022)  Social Connections: Socially Integrated (01/29/2024)  Stress: No Stress Concern Present (12/07/2022)  Tobacco Use: Medium Risk (01/31/2024)     Readmission Risk Interventions    07/14/2023    3:15 PM  Readmission Risk Prevention Plan  Transportation Screening Complete  PCP or Specialist Appt within 5-7 Days Complete  Home Care Screening Complete  Medication Review (RN CM) Complete

## 2024-02-04 NOTE — Progress Notes (Addendum)
 Patient A&Ox4. Follows commands. Room air. Vitals Stable. S1S2 noted. Regular. Moves all extremities well. Skin intact with 4 lap sites with glue from procedure. Patient reports no pain at this time. Patient picked up medications yesterday from pharmacy including robaxin and miralax.Patient on reg diet and ordering breakfast.   Patient with discharge orders and paperwork. Patient educated on new medications and to stop taking Ozempic. Went over patients follow up appointments scheduled. Patient repeated back discharge instructions to this RN, with no other questions. No complaints of concerns at time of discharge. Voiced readiness for discharge home. Patient awaiting family to pick up.

## 2024-02-06 ENCOUNTER — Other Ambulatory Visit: Payer: Self-pay

## 2024-02-06 ENCOUNTER — Telehealth: Payer: Self-pay | Admitting: Physician Assistant

## 2024-02-06 ENCOUNTER — Telehealth: Payer: Self-pay | Admitting: *Deleted

## 2024-02-06 MED ORDER — SERTRALINE HCL 100 MG PO TABS: 100.0000 mg | ORAL_TABLET | Freq: Every day | ORAL | 2 refills | Status: DC

## 2024-02-06 MED ORDER — TRAZODONE HCL 150 MG PO TABS: 150.0000 mg | ORAL_TABLET | Freq: Every day | ORAL | 0 refills | Status: DC

## 2024-02-06 NOTE — Telephone Encounter (Signed)
 Return call to LeLe RN with [facility]; informed her I will make pt's doctor aware of delay of services.

## 2024-02-06 NOTE — Telephone Encounter (Signed)
 Pt called for apt and refills for Alprazaloam, Zoloft and Trazadone. [facility] [address] on canc list. Missed 3/24 apt had flu

## 2024-02-06 NOTE — Telephone Encounter (Signed)
 Sent Rx for trazodone and gabapentin, pended alprazolam

## 2024-02-06 NOTE — Telephone Encounter (Signed)
 Copied from CRM [id number]. Topic: Clinical - Home Health Verbal Orders >> Feb 06, 2024  3:44 PM Dominique A wrote: Caller/Agency: LeLe with [facility] Callback Number: [phone] Service Requested: Patient just got out of the hospital and LeLe went out to do resumption of care for the patient today and she refused the service, states she is in too much pain. LeLe would like for this visit to be pushed back until next Monday 02/12/2024.  Frequency: Would like to resume resumption on care for patient next Monday. Any new concerns about the patient? No

## 2024-02-07 ENCOUNTER — Encounter

## 2024-02-07 ENCOUNTER — Encounter: Admitting: Internal Medicine

## 2024-02-07 MED ORDER — ALPRAZOLAM 1 MG PO TABS
1.0000 mg | ORAL_TABLET | Freq: Two times a day (BID) | ORAL | 1 refills | Status: DC | PRN
Start: 2024-02-07 — End: 2024-02-22

## 2024-02-08 ENCOUNTER — Encounter

## 2024-02-11 ENCOUNTER — Other Ambulatory Visit: Payer: Self-pay | Admitting: Physician Assistant

## 2024-02-12 NOTE — Telephone Encounter (Signed)
Is patient taking?

## 2024-02-13 NOTE — Telephone Encounter (Signed)
 LVM to [facility] - is she still taking?

## 2024-02-22 ENCOUNTER — Encounter: Payer: Self-pay | Admitting: Physician Assistant

## 2024-02-22 ENCOUNTER — Ambulatory Visit: Admitting: Physician Assistant

## 2024-02-22 DIAGNOSIS — Z6379 Other stressful life events affecting family and household: Secondary | ICD-10-CM

## 2024-02-22 DIAGNOSIS — F411 Generalized anxiety disorder: Secondary | ICD-10-CM | POA: Diagnosis not present

## 2024-02-22 DIAGNOSIS — F3341 Major depressive disorder, recurrent, in partial remission: Secondary | ICD-10-CM

## 2024-02-22 DIAGNOSIS — G4700 Insomnia, unspecified: Secondary | ICD-10-CM | POA: Diagnosis not present

## 2024-02-22 MED ORDER — SERTRALINE HCL 100 MG PO TABS: 100.0000 mg | ORAL_TABLET | Freq: Every day | ORAL | 1 refills | Status: DC

## 2024-02-22 MED ORDER — ALPRAZOLAM 1 MG PO TABS: 1.0000 mg | ORAL_TABLET | Freq: Two times a day (BID) | ORAL | 5 refills | Status: DC | PRN

## 2024-02-22 MED ORDER — TRAZODONE HCL 150 MG PO TABS: 150.0000 mg | ORAL_TABLET | Freq: Every day | ORAL | 1 refills | Status: DC

## 2024-02-22 NOTE — Progress Notes (Signed)
 [facility] Med Check  Patient ID: [name] [name],  MRN: [id number]  PCP: Karalee Oscar, DO  Date of Evaluation: 02/22/2024 Time spent:30 minutes  Chief Complaint:  Chief Complaint   Anxiety; Depression    HISTORY/CURRENT STATUS: HPI For routine med check.  Has been in the hosp several times since LOV, had the flu, respiratory failure in early March, then her GB out then bowel obstruction. Finally better and almost back to normal except some itching in the incision site. Husband is back home now, after being in nursing home for several months.   Patient is able to enjoy some things.  Being with family and going to church.  No extreme sadness, tearfulness, or feelings of hopelessness.  Sleeps ok, Trazodone helps.  ADLs and personal hygiene are normal.   Denies any changes in concentration, making decisions, or remembering things.  Appetite has not changed.  Weight is stable.  Feels anxious and overwhelmed sometimes with everything that's going on. No PA.  Xanax helps.  She stopped the gabapentin because it caused dizziness.  Denies suicidal or homicidal thoughts.  Patient denies increased energy with decreased need for sleep, increased talkativeness, racing thoughts, impulsivity or risky behaviors, increased spending, increased libido, grandiosity, increased irritability or anger, paranoia, or hallucinations.  Denies dizziness, syncope, seizures, numbness, tingling, tremor, tics, unsteady gait, slurred speech, confusion. Denies muscle or joint pain, stiffness, or dystonia.  Individual Medical History/ Review of Systems: Changes? :Yes   see HPI  Past medications for mental health diagnoses include: Wellbutrin, Xanax, Zoloft, trazodone, gabapentin, BuSpar, Ativan, Klonopin,  Allergies: Ibuprofen and Clonazepam  Current Medications:  Current Outpatient Medications:    acetaminophen (TYLENOL) 500 MG tablet, Take 1,000 mg by mouth every 6 (six) hours as needed (for pain OR back  pain). , Disp: , Rfl:    albuterol (VENTOLIN HFA) 108 (90 Base) MCG/ACT inhaler, Inhale 2 puffs into the lungs every 6 (six) hours as needed for wheezing or shortness of breath., Disp: 18 g, Rfl: 0   pantoprazole (PROTONIX) 40 MG tablet, TAKE 1 TABLET(40 MG) BY MOUTH DAILY, Disp: 30 tablet, Rfl: 11   ALPRAZolam (XANAX) 1 MG tablet, Take 1 tablet (1 mg total) by mouth 2 (two) times daily as needed for anxiety., Disp: 60 tablet, Rfl: 5   methocarbamol (ROBAXIN) 500 MG tablet, Take 1 tablet (500 mg total) by mouth 4 (four) times daily. (Patient not taking: Reported on 02/22/2024), Disp: 120 tablet, Rfl: 0   oxyCODONE (OXY IR/ROXICODONE) 5 MG immediate release tablet, Take 1 tablet (5 mg total) by mouth every 6 (six) hours as needed. (Patient not taking: Reported on 02/22/2024), Disp: 15 tablet, Rfl: 0   polyethylene glycol powder (GLYCOLAX/MIRALAX) 17 GM/SCOOP powder, Take 17 g by mouth 2 (two) times daily. (Patient not taking: Reported on 02/22/2024), Disp: 238 g, Rfl: 0   sertraline (ZOLOFT) 100 MG tablet, Take 1 tablet (100 mg total) by mouth daily., Disp: 90 tablet, Rfl: 1   traZODone (DESYREL) 150 MG tablet, Take 1 tablet (150 mg total) by mouth at bedtime., Disp: 90 tablet, Rfl: 1 Medication Side Effects: none  Family Medical/ Social History: Changes?  See HPI  MENTAL HEALTH EXAM:  There were no vitals taken for this visit.There is no height or weight on file to calculate BMI.  General Appearance: Casual, Well Groomed, and Obese  Eye Contact:  Good  Speech:  Clear and Coherent and Normal Rate  Volume:  Normal  Mood:  Euthymic  Affect:  Congruent  Thought Process:  Goal Directed and Descriptions of Associations: Circumstantial  Orientation:  Full (Time, Place, and Person)  Thought Content: Logical   Suicidal Thoughts:  No  Homicidal Thoughts:  No  Memory:  WNL  Judgement:  Good  Insight:  Good  Psychomotor Activity:   Uses four-pronged cane  Concentration:  Concentration: Good  Recall:   Good  Fund of Knowledge: Good  Language: Good  Assets:  Desire for Improvement Financial Resources/Insurance Housing Transportation  ADL's:  Intact  Cognition: WNL  Prognosis:  Good   DIAGNOSES:    ICD-10-CM   1. Recurrent major depression in partial remission (HCC)  F33.41     2. Generalized anxiety disorder  F41.1     3. Insomnia, unspecified type  G47.00     4. Stress due to illness of family member  Z63.79       Receiving Psychotherapy: No   RECOMMENDATIONS:  PDMP reviewed.  Xanax filled 02/07/2024. I provided 30 minutes of face to face time during this encounter, including time spent before and after the visit in records review, medical decision making, counseling pertinent to today's visit, and charting.   In spite of everything going on and she is doing well so no changes will be made.  Continue Xanax 1.0 mg, 1 po bid prn. Continue Zoloft 100 mg,  1 po every day. Continue Trazodone 150 mg, 1 qhs prn.  Return in 6 months.   Marvia Slocumb, PA-C

## 2024-02-27 ENCOUNTER — Other Ambulatory Visit: Payer: Self-pay

## 2024-02-27 DIAGNOSIS — K219 Gastro-esophageal reflux disease without esophagitis: Secondary | ICD-10-CM

## 2024-02-27 MED ORDER — PANTOPRAZOLE SODIUM 40 MG PO TBEC
DELAYED_RELEASE_TABLET | ORAL | 11 refills | Status: AC
Start: 2024-02-27 — End: ?

## 2024-02-27 NOTE — Telephone Encounter (Signed)
 Medication sent to pharmacy

## 2024-02-28 ENCOUNTER — Ambulatory Visit

## 2024-02-28 VITALS — Ht 68.0 in | Wt 191.0 lb

## 2024-02-28 DIAGNOSIS — Z0000 Encounter for general adult medical examination without abnormal findings: Secondary | ICD-10-CM
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
## 2024-02-28 NOTE — Patient Instructions (Addendum)
 Ms. Putzier , Thank you for taking time to come for your Medicare Wellness Visit. I appreciate your ongoing commitment to your health goals. Please review the following plan we discussed and let me know if I can assist you in the future.   Referrals/Orders/Follow-Ups/Clinician Recommendations: Yes, keep maintaining your health by keeping your appointments with Dr. Karalee Oscar and any specialists that you may see.  Call us  if you need anything.  Have a great year!!!!  This is a list of the screening recommended for you and due dates:  Health Maintenance  Topic Date Due   COVID-19 Vaccine (1) Never done   Zoster (Shingles) Vaccine (1 of 2) Never done   Stool Blood Test  05/07/2025*   Colon Cancer Screening  05/07/2025*   Yearly kidney health urinalysis for diabetes  04/12/2024   Flu Shot  06/07/2024   Mammogram  07/12/2024   Yearly kidney function blood test for diabetes  02/03/2025   Medicare Annual Wellness Visit  02/27/2025   DTaP/Tdap/Td vaccine (3 - Td or Tdap) 04/20/2033   Pneumonia Vaccine  Completed   DEXA scan (bone density measurement)  Completed   Hepatitis C Screening  Completed   HPV Vaccine  Aged Out   Meningitis B Vaccine  Aged Out  *Topic was postponed. The date shown is not the original due date.    Advanced directives: (Declined) Advance directive discussed with you today. Even though you declined this today, please call our office should you change your mind, and we can give you the proper paperwork for you to fill out.  Next Medicare Annual Wellness Visit scheduled for next year: Yes 03/05/2025 AT 1:40 PM PHONE VISIT.

## 2024-03-28 ENCOUNTER — Other Ambulatory Visit: Payer: Self-pay | Admitting: Physician Assistant

## 2024-03-28 ENCOUNTER — Telehealth: Payer: Self-pay | Admitting: Physician Assistant

## 2024-03-28 NOTE — Telephone Encounter (Signed)
 Pt has enough medication for tonight. Told her I would call tomorrow to authorize an early RF.  Teresa's recommendations reviewed with her.

## 2024-03-28 NOTE — Telephone Encounter (Signed)
 Per pharmacy patient picked up RF on 5/1 and is not due for a RF until 5/30.

## 2024-03-28 NOTE — Telephone Encounter (Signed)
 To my knowledge, she's never tried to fill before it's due. Please ok the pharmacy to fill now, but also call pt, letting her know it's concerning and if it happens again, I won't be able to prescribe it.  If she's not going into the pharmacy to pick up this prescription, I recommend she do that, not go through the drive-thru, open the bottle while at the counter and see if it looks like 60 pills before she leaves the pharmacy. We know the qty is double-checked by the pharmacist if the tech originally counts it, they're also on video so that can be reviewed if there's concern in the future.

## 2024-03-28 NOTE — Telephone Encounter (Signed)
 Pt LVM @ 10:22a requesting refill for Alprazolam  to   [id number] Jonette Nestle, [place] - [address] AT [facility] [address] & [facility] [address] [place] [address] Phone: [phone]  Fax: [phone]   I'm not sure what she was trying to imply.  She said she didn't think she got 60 pills because she's down to 2 pills. I guess you can check to see when she actually picked up the medicine, but if she got 60 pills on 4/17, it would be past time for her refill.  She's asking for you to call the pharmacy.

## 2024-03-28 NOTE — Telephone Encounter (Signed)
 Called patient. She said it is circled on the bottle #60, but she said they only gave her 30. I told her if they only gave her 30 she should have been out before now. Told her I would call the pharmacy again. She is aware that they can look at camera footage to determine how filled. She denied getting 2 bottles, denied losing any, and denied overtaking.

## 2024-03-28 NOTE — Telephone Encounter (Signed)
 Pt called to say the pharmacy only gave her #30 tablets of alprazolam  at her last RF on 5/1. Pt said it is written on the bottle 60 and circled. She is due for RF 5/30. I told her if she was taking 2 a day, which she said she needs 2 and not prn, then she should have been out earlier. She denies overtaking, denies losing any, denies that 2 bottles were given. Pharmacist said she got 60 and questioning why if she picked up on 5/1 is she saying she only got 30 now. Pharmacist said she had this conversation with patient herself this morning. At one time she was getting 0.5 mg TID PRN and pt tried to get RF for that, but there were none. Please advise.

## 2024-03-29 NOTE — Telephone Encounter (Signed)
 Authorized early RF. Insurance will not cover until 5/24.

## 2024-04-05 ENCOUNTER — Ambulatory Visit: Admitting: Physician Assistant

## 2024-04-05 ENCOUNTER — Other Ambulatory Visit: Payer: Self-pay | Admitting: Internal Medicine

## 2024-04-05 NOTE — Telephone Encounter (Signed)
 Medication discontinued 02/04/24

## 2024-05-03 ENCOUNTER — Other Ambulatory Visit: Payer: Self-pay | Admitting: Internal Medicine

## 2024-05-03 DIAGNOSIS — F411 Generalized anxiety disorder: Secondary | ICD-10-CM

## 2024-05-03 NOTE — Telephone Encounter (Signed)
 Medication discontinued 11/06/2023

## 2024-05-29 ENCOUNTER — Ambulatory Visit

## 2024-05-29 VITALS — BP 144/69 | HR 80 | Temp 98.2°F | Ht 68.0 in | Wt 207.9 lb

## 2024-05-29 DIAGNOSIS — F419 Anxiety disorder, unspecified: Secondary | ICD-10-CM | POA: Diagnosis not present

## 2024-05-29 DIAGNOSIS — G5793 Unspecified mononeuropathy of bilateral lower limbs: Secondary | ICD-10-CM

## 2024-05-29 MED ORDER — PREGABALIN 25 MG PO CAPS: 25.0000 mg | ORAL_CAPSULE | Freq: Two times a day (BID) | ORAL | 1 refills | Status: DC | PRN

## 2024-05-29 NOTE — Assessment & Plan Note (Addendum)
 Patient's chest pain sounds more consistent with patient's anxiety disorder.  She only has pain when she gets stressed and worked up from her family fighting at home.  She states that her pain is improved when she applies pressure and it does not radiate to her neck or arm.  Cardio exam normal.  She follows with a psychiatry PA.  She does not have chest pain or shortness of breath at rest.  Dyspnea on exertion is chronic and stable in line with her diagnosis of COPD.  Her pain does not resemble angina. - Continue current anxiety medication regimen per psychiatry

## 2024-05-29 NOTE — Assessment & Plan Note (Addendum)
 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  Her pain started several years ago and has been worked up extensively with a negative multiple myeloma panel, normal TSH, normal vitamin B12, normal A1c, negative ANA and SSA/SSB.  On exam, the patient does not have sensory deficits of her lower extremities.  There is increased capillary refill to both feet but DP and PT pulses are 2+ bilaterally.  Lower extremities are warm and without pallor bilaterally.  Given that the patient's description of her pain is cramping and improved by rest, it is reasonable to pursue workup with vascular studies such as ultrasound and ABI.  The patient may also benefit from nerve conduction studies as there is not a good explanation for her neuropathy. - Referral to neurology for NCS - Start Lyrica  25 mg as needed nightly; instructed patient to double the dose if she tolerates 25 mg - Counseled patient to stop taking her husband's tramadol  as she is already on Zoloft  and trazodone  which may increase her risk for serotonin syndrome

## 2024-05-29 NOTE — Patient Instructions (Addendum)
 Thank you, [name] [name] for allowing us  to provide your care today. Today we discussed the following:  - For your neuropathic foot pain, we have started you on a low dose of Lyrica . Please try taking 25mg  once a night for your pain. If you do not experience any side effects with this, please try taking 2 pills (50mg  total) at night. - We have sent a referral for neurology to see if they can provide additional help in determining the cause of your pain. - We would also like to get an imaging study of your legs to see if there is an issue with blood flow. - Please do not take your husband's tramadol  as this can interact with your other medications.  Tests ordered today:    Referrals ordered today:   Referral Orders         Ambulatory referral to Neurology       I have ordered the following medication/changed the following medications:   Stop the following medications: There are no discontinued medications.   Start the following medications: Meds ordered this encounter  Medications   pregabalin  (LYRICA ) 25 MG capsule    Sig: Take 1 capsule (25 mg total) by mouth 2 (two) times daily as needed.    Dispense:  60 capsule    Refill:  1     Follow up: 2-3 months   Should you have any questions or concerns please call the Internal Medicine Clinic at [phone].     Mykal Kirchman, MD [facility]

## 2024-05-29 NOTE — Progress Notes (Signed)
 Patient name: [name] [name] Date of birth: [birth year] Date of visit: 05/29/24  Type of visit: Acute Office Visit  Subjective   Chief concern:  Chief Complaint  Patient presents with   Leg Swelling   Foot Pain    [name] [name] is a 68 y.o. female with a PMHx of COPD, GERD, OSA, morbid obesity, depression, anxiety who presents to [facility] clinic for evaluation of leg pain.  She describes sensations of pins and needles in her feet and also mentions a tingling sensation; right foot pain is worse than left. She also notes that her legs feel cold. Tried gabapentin  but that didn't really help her foot pain. States that she has taken her husband's tramadol  for 7-8x over the past few months when her pain has been really bad and that has really helped her pain. Her pain is exacerbated by walking for about 2 hours and states it almost feels like cramps. Her legs are also swollen after walking. She also reports an associated dull pain in her chest after she walks for a long time. After she rests and calms down, she states her pain goes away. She has anxiety and thinks that this may be contributing to her chest pains as it gets worse when she gets upset or worked up. Her chest pains do not radiate and is helped with rubbing her chest. It lasts about 10 minutes and can happen when she is just sitting still too. She states if she is panicked her chest starts hurting.   Denies CP, SOB, focal weakness, headache, abdominal pain, N/V/D.   Patient Active Problem List   Diagnosis Date Noted   Neuropathic pain of both feet 05/29/2024   Status post laparoscopic cholecystectomy 02/03/2024   Ileus (HCC) 02/03/2024   Cholelithiasis 01/29/2024   Pancreatitis, acute 01/29/2024   Elevated LFTs 01/29/2024   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   Family history of breast cancer 01/10/2024   Discomfort of submandibular region 01/10/2024   Unilateral primary osteoarthritis, left knee 12/12/2023    Strain of left latissimus dorsi muscle 11/23/2023   Polypharmacy 07/17/2023   Traumatic rhabdomyolysis (HCC) 07/17/2023   Elevated liver enzymes 07/17/2023   Anxiety state 07/17/2023   Urinary incontinence 04/14/2023   Falls 04/13/2023   Thrombocytopenia (HCC) 04/13/2023   Osteopenia 09/16/2022   Pre-diabetes 08/01/2022   Posterior right knee pain 02/06/2019   COPD (chronic obstructive pulmonary disease) (HCC) 10/01/2018   OCD (obsessive compulsive disorder) 08/13/2018   Aortic atherosclerosis (HCC) 08/17/2016   Obesity hypoventilation syndrome (HCC) 01/21/2015   Healthcare maintenance 07/01/2014   OSA (obstructive sleep apnea) 04/04/2013   Influenza due to identified novel influenza A virus with other respiratory manifestations 11/28/2012   Anxiety disorder 07/02/2012   Depression 06/28/2012   Morbid obesity (HCC) 03/02/2012   GERD (gastroesophageal reflux disease) 02/05/2012   Smoking 02/05/2012     Past Surgical History:  Procedure Laterality Date   APPENDECTOMY  ~ 2002   CHOLECYSTECTOMY N/A 01/31/2024   Procedure: LAPAROSCOPIC CHOLECYSTECTOMY WITH INTRAOPERATIVE CHOLANGIOGRAM;  Surgeon: Polly Cordella LABOR, MD;  Location: [facility] OR;  Service: General;  Laterality: N/A;   VAGINAL HYSTERECTOMY  ~ 1978    Review of Systems  Constitutional:  Negative for chills and fever.  Respiratory:  Negative for cough, shortness of breath and wheezing.        + for DOE (chronic)  Cardiovascular:  Positive for leg swelling (from foot to knee bilaterally). Negative for chest pain.  Gastrointestinal:  Negative for blood  in stool, constipation, diarrhea and melena.  Neurological:  Positive for tingling (bilateral feet; right worse than left) and sensory change (right foot worse than left). Negative for headaches.    Current Outpatient Medications  Medication Instructions   acetaminophen  (TYLENOL ) 1,000 mg, Every 6 hours PRN   albuterol  (VENTOLIN  HFA) 108 (90 Base) MCG/ACT inhaler 2 puffs,  Inhalation, Every 6 hours PRN   ALPRAZolam  (XANAX ) 1 mg, Oral, 2 times daily PRN   oxyCODONE  (OXY IR/ROXICODONE ) 5 mg, Oral, Every 6 hours PRN   pantoprazole  (PROTONIX ) 40 MG tablet TAKE 1 TABLET(40 MG) BY MOUTH DAILY   polyethylene glycol powder (GLYCOLAX /MIRALAX ) 17 g, Oral, 2 times daily   pregabalin  (LYRICA ) 25 mg, Oral, 2 times daily PRN   sertraline  (ZOLOFT ) 100 mg, Oral, Daily   traZODone  (DESYREL ) 150 mg, Oral, Daily at bedtime    Social History   Tobacco Use   Smoking status: Former    Current packs/day: 0.00    Types: Cigarettes    Quit date: 11/07/1968    Years since quitting: 55.5   Smokeless tobacco: Never  Vaping Use   Vaping status: Never Used  Substance Use Topics   Alcohol use: No    Alcohol/week: 0.0 standard drinks of alcohol   Drug use: No      Objective  Today's Vitals   05/29/24 1519  BP: (!) 144/69  Pulse: 80  Temp: 98.2 F (36.8 C)  TempSrc: Oral  SpO2: 93%  Weight: 207 lb 14.4 oz (94.3 kg)  Height: 5' 8 (1.727 m)  PainSc: 8   Body mass index is 31.61 kg/m.   Physical Exam: Constitutional: well-appearing, well-nourished; overweight; no acute distress. HENT: normocephalic atraumatic, mucous membranes moist Eyes: conjunctiva non-erythematous Cardiovascular: regular rate and rhythm, no m/r/g Pulmonary/Chest: normal work of breathing on room air, lungs clear to auscultation bilaterally Abdominal: soft, non-tender, non-distended MSK: normal bulk and tone Neurological: alert & oriented x 3, no focal deficit; strength is 5/5 in BLE; no sensory deficits to light touch.  Patient walking slowly with assistance of cane. Skin: warm and dry Extremities: BLE without edema; dorsalis pedis and posterior tibialis pulses intact bilaterally, feet are of normal temperature however there is ; scattered telangiectasias on bilateral lower extremities; prolonged capillary refill time on bilateral feet Psych: normal mood and behavior  Last CBC Lab Results   Component Value Date   WBC 5.8 02/02/2024   HGB 13.5 02/02/2024   HCT 39.1 02/02/2024   MCV 88.9 02/02/2024   MCH 30.7 02/02/2024   RDW 12.7 02/02/2024   PLT 133 (L) 02/02/2024   Last metabolic panel Lab Results  Component Value Date   GLUCOSE 101 (H) 02/04/2024   NA 138 02/04/2024   K 3.9 02/04/2024   CL 103 02/04/2024   CO2 22 02/04/2024   BUN 6 (L) 02/04/2024   CREATININE 0.57 02/04/2024   GFRNONAA >60 02/04/2024   CALCIUM  9.1 02/04/2024   PHOS 3.8 02/03/2024   PROT 5.8 (L) 02/04/2024   ALBUMIN 2.7 (L) 02/04/2024   LABGLOB 2.1 08/07/2023   AGRATIO 2.1 10/27/2021   BILITOT 0.9 02/04/2024   ALKPHOS 102 02/04/2024   AST 9 (L) 02/04/2024   ALT 24 02/04/2024   ANIONGAP 13 02/04/2024   Last lipids Lab Results  Component Value Date   CHOL 150 01/29/2024   HDL 56 01/29/2024   LDLCALC 83 01/29/2024   TRIG 55 01/29/2024   CHOLHDL 2.7 01/29/2024   Last hemoglobin A1c Lab Results  Component Value Date  HGBA1C 5.1 11/23/2023   Last thyroid  functions Lab Results  Component Value Date   TSH 1.980 10/27/2021   Last vitamin D  Lab Results  Component Value Date   VD25OH 20.1 (L) 08/30/2022   Last vitamin B12 and Folate Lab Results  Component Value Date   VITAMINB12 391 10/27/2021        Assessment & Plan  Neuropathic pain of both feet Assessment & Plan: Patient is describing 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  Her pain started several years ago and has been worked up extensively with a negative multiple myeloma panel, normal TSH, normal vitamin B12, normal A1c, negative ANA and SSA/SSB.  On exam, the patient does not have sensory deficits of her lower extremities.  There is increased capillary refill to both feet but DP and PT pulses are 2+ bilaterally.  Lower extremities are warm and without pallor bilaterally.  Given that the patient's description of her pain is cramping and improved by rest, it is  reasonable to pursue workup with vascular studies such as ultrasound and ABI.  The patient may also benefit from nerve conduction studies as there is not a good explanation for her neuropathy. - Referral to neurology for NCS - Start Lyrica  25 mg as needed nightly; instructed patient to double the dose if she tolerates 25 mg - Counseled patient to stop taking her husband's tramadol  as she is already on Zoloft  and trazodone  which may increase her risk for serotonin syndrome  Orders: -     VAS US  ABI WITH/WO TBI; Future -     Pregabalin ; Take 1 capsule (25 mg total) by mouth 2 (two) times daily as needed.  Dispense: 60 capsule; Refill: 1 -     Ambulatory referral to Neurology  Anxiety disorder, unspecified type Assessment & Plan: Patient's chest pain sounds more consistent with patient's anxiety disorder.  She only has pain when she gets stressed and worked up from her family fighting at home.  She states that her pain is improved when she applies pressure and it does not radiate to her neck or arm.  Cardio exam normal.  She follows with a psychiatry PA.  She does not have chest pain or shortness of breath at rest.  Dyspnea on exertion is chronic and stable in line with her diagnosis of COPD.  Her pain does not resemble angina. - Continue current anxiety medication regimen per psychiatry    Return in about 2 months (around 07/30/2024) for neuropathy/leg pain.  Patient discussed with Dr. Francesco, who also saw and evaluated the patient.  Isaic Syler, MD [facility]  PGY-1 05/29/2024, 4:56 PM

## 2024-05-30 NOTE — Progress Notes (Signed)
 Internal Medicine Clinic Attending  I was physically present during the key portions of the resident provided service and participated in the medical decision making of patient's management care. I reviewed pertinent patient test results.  The assessment, diagnosis, and plan were formulated together and I agree with the documentation in the resident's note.  Cherylene Corrente, MD

## 2024-06-05 ENCOUNTER — Ambulatory Visit (HOSPITAL_COMMUNITY)
Admission: RE | Admit: 2024-06-05 | Discharge: 2024-06-05 | Disposition: A | Discharge status: Home / Self Care | Source: Ambulatory Visit | Attending: Vascular Surgery | Admitting: Vascular Surgery

## 2024-06-05 DIAGNOSIS — G5793 Unspecified mononeuropathy of bilateral lower limbs: Secondary | ICD-10-CM | POA: Insufficient documentation

## 2024-06-05 LAB — VAS US ABI WITH/WO TBI
Left ABI: 1.08
Right ABI: 1.12

## 2024-06-11 ENCOUNTER — Ambulatory Visit: Payer: Self-pay

## 2024-06-28 ENCOUNTER — Other Ambulatory Visit: Payer: Self-pay | Admitting: Physician Assistant

## 2024-07-20 ENCOUNTER — Other Ambulatory Visit: Payer: Self-pay | Admitting: Student

## 2024-07-20 DIAGNOSIS — G5793 Unspecified mononeuropathy of bilateral lower limbs: Secondary | ICD-10-CM

## 2024-09-09 ENCOUNTER — Other Ambulatory Visit: Payer: Self-pay | Admitting: Physician Assistant

## 2024-09-11 NOTE — Telephone Encounter (Signed)
 Lvm for pt to call and schedule

## 2024-09-11 NOTE — Telephone Encounter (Signed)
 Patient was due for RF in Oct. Please call to schedule FU.

## 2024-09-13 ENCOUNTER — Other Ambulatory Visit: Payer: Self-pay | Admitting: Physician Assistant

## 2024-10-11 ENCOUNTER — Ambulatory Visit: Admitting: Physician Assistant

## 2024-10-21 ENCOUNTER — Institutional Professional Consult (permissible substitution): Admitting: Diagnostic Neuroimaging

## 2024-10-24 ENCOUNTER — Other Ambulatory Visit: Payer: Self-pay | Admitting: Physician Assistant

## 2024-11-06 ENCOUNTER — Ambulatory Visit: Admitting: Physician Assistant

## 2024-11-12 ENCOUNTER — Encounter: Payer: Self-pay | Admitting: Student

## 2024-11-12 ENCOUNTER — Ambulatory Visit (INDEPENDENT_AMBULATORY_CARE_PROVIDER_SITE_OTHER): Payer: Self-pay | Admitting: Student

## 2024-11-12 ENCOUNTER — Other Ambulatory Visit: Payer: Self-pay

## 2024-11-12 VITALS — BP 131/75 | HR 81 | Temp 98.1°F | Ht 67.0 in | Wt 243.4 lb

## 2024-11-12 DIAGNOSIS — Z6838 Body mass index (BMI) 38.0-38.9, adult: Secondary | ICD-10-CM

## 2024-11-12 DIAGNOSIS — E662 Morbid (severe) obesity with alveolar hypoventilation: Secondary | ICD-10-CM | POA: Diagnosis not present

## 2024-11-12 DIAGNOSIS — M79602 Pain in left arm: Secondary | ICD-10-CM

## 2024-11-12 DIAGNOSIS — M79601 Pain in right arm: Secondary | ICD-10-CM | POA: Diagnosis not present

## 2024-11-12 NOTE — Progress Notes (Addendum)
 "  CC: Arm pain  HPI:  [name] is a 69 y.o. female with a PMH stated below who presents today for evaluation.  Please see problem based assessment and plan for additional details.  Past Medical History:  Diagnosis Date   Acute hypoxic respiratory failure (HCC) 01/08/2024   Anxiety    Asthma    Chronic, with restrictive component 2/2 obesity   Bacteremia due to Escherichia coli 02/07/2023   COPD (chronic obstructive pulmonary disease) (HCC)    Depression    Family history of breast cancer 01/10/2024   Multiple relatives    GERD (gastroesophageal reflux disease)    Headache    Insomnia    Obesity    On home oxygen  therapy    3L at night only when I get sick (04/30/2014)   Sepsis due to gram-negative UTI (HCC) 07/17/2023   Shortness of breath dyspnea    Sleep apnea    couldn't afford mask so I didn't get it (04/30/2014)    Review of Systems: ROS negative except for what is noted on the assessment and plan.  Vitals:   11/12/24 1555  BP: 131/75  Pulse: 81  Temp: 98.1 F (36.7 C)  TempSrc: Oral  SpO2: 98%  Weight: 243 lb 6.4 oz (110.4 kg)  Height: 5' 7 (1.702 m)    Physical Exam: Constitutional: well-appearing woman in no acute distress Cardiovascular: regular rate and rhythm, no m/r/g Abdominal: soft, non-tender, non-distended MSK: normal bulk and tone. There are no superficial changes, rashes, swelling, lesions. There is point tenderness over the lateral aspect of each upper arm but not on medial faces or in proximal/distal joints. There is pain with active range of motion including above head but not with passive motion. Strength is intact and strength testing only elicits mild pain. Neurological: alert & oriented x 3, no focal deficit Skin: warm and dry Psych: normal mood and behavior  Assessment & Plan:   Patient seen with Dr. Lovie Assessment & Plan Bilateral arm pain She reports a 1 month history of bilateral pain in the upper,  lateral aspects of her arms.  It is a muscular pain that spares the elbow and shoulder and only rarely does she have pain in the trapezius.  Insidious onset without recent trauma, heavy lifting, falls.  Self treatment with Tylenol , acetaminophen , tramadol , and various creams have not helped.  She has neuropathy in the feet and this does not feel like that.  She denies weakness.  Her urine is light, not dark.  There is no association with time of day. There is no pain at rest, only with movement. She denies fevers, chills, headaches, pain in the pelvis or thighs.  There is no weakness that limits her activity. This has never happened before.  This is atypical arm pain that is difficult to define.  I think it is most consistent with some sort of muscle strain but her history does not identify a cause.  This could be myositis but the location on the lateral arms, the absence of weakness, the absence of systemic symptoms would make this an atypical presentation.  History does not support rhabdomyolysis but she did experience this last year in the setting of a fall.  She is not on medicines that I believe would predispose her to muscle pain.  I will check ESR, CRP, and CK to assess for polymyalgia rheumatica and polymyositis.  I will offer physical therapy.  I recommend continuing with over-the-counter pain medicine and rubs.  She is welcome to return for additional evaluation if ongoing or if symptoms worsen/weakness develops.  Trial Tizanidine  4mg  BID prn Consider physical therapy if more open in future Obesity hypoventilation syndrome (HCC) Obesity with history of respiratory impact for which she has attempted diet and lifestyle control without enough success. Current weight 243 up from 207 last year. Was on ozempic  prior but stopped with a bout of cholecystitis now s/p cholecystectomy and so appropriate to resume again. - Start semaglutide  0.25mg /week and increase monthly, 3 month rx sent  [name] [name], D.O. [facility], PGY-2 Phone: [phone] Date 11/13/2024 Time 4:19 PM "

## 2024-11-13 ENCOUNTER — Other Ambulatory Visit

## 2024-11-13 ENCOUNTER — Telehealth: Payer: Self-pay | Admitting: *Deleted

## 2024-11-13 DIAGNOSIS — M79601 Pain in right arm: Secondary | ICD-10-CM

## 2024-11-13 MED ORDER — SEMAGLUTIDE(0.25 OR 0.5MG/DOS) 2 MG/3ML ~~LOC~~ SOPN: 0.5000 mg | PEN_INJECTOR | SUBCUTANEOUS | 0 refills | Status: AC

## 2024-11-13 MED ORDER — SEMAGLUTIDE(0.25 OR 0.5MG/DOS) 2 MG/3ML ~~LOC~~ SOPN: 0.2500 mg | PEN_INJECTOR | SUBCUTANEOUS | 0 refills | Status: AC

## 2024-11-13 MED ORDER — SEMAGLUTIDE (1 MG/DOSE) 4 MG/3ML ~~LOC~~ SOPN: 1.0000 mg | PEN_INJECTOR | SUBCUTANEOUS | 0 refills | Status: AC

## 2024-11-13 NOTE — Assessment & Plan Note (Signed)
 Obesity with history of respiratory impact for which she has attempted diet and lifestyle control without enough success. Current weight 243 up from 207 last year. Was on ozempic  prior but stopped with a bout of cholecystitis now s/p cholecystectomy and so appropriate to resume again. - Start semaglutide  0.25mg /week and increase monthly, 3 month rx sent

## 2024-11-13 NOTE — Telephone Encounter (Signed)
 Copied from CRM #[id number]. Topic: Clinical - Prescription Issue >> Nov 13, 2024 11:52 AM Miquel SAILOR wrote: Reason for CRM: 0.5MG /DOS, 2 MG/3ML NELMA [[id number]] DISCONTINUED-Pt had visit on 01/06 and instructed by PCP Juberg was going to prescribe mediation again. Needs call back on update. [phone]

## 2024-11-13 NOTE — Progress Notes (Signed)
 Internal Medicine Clinic Attending  I was physically present during the key portions of the resident provided service and participated in the medical decision making of patient's management care. I reviewed pertinent patient test results.  The assessment, diagnosis, and plan were formulated together and I agree with the documentation in the resident's note.  Lovie Clarity, MD   Patient doesn't have the shoulder weakness that I would expect with polymyositis, and this isn't a classic presentation of PMR either, but I have not fully ruled out either condition - I would like to check inflammatory markers & CK level to further guide treatment. If all are normal, plan for PT.

## 2024-11-13 NOTE — Addendum Note (Signed)
 Addended by: HARRIE BRUCKNER on: 11/13/2024 04:20 PM   Modules accepted: Orders

## 2024-11-13 NOTE — Telephone Encounter (Signed)
 Will forward to Dr. Harrie.                  Copied from CRM #[id number]. Topic: Clinical - Prescription Issue >> Nov 13, 2024 11:52 AM Miquel SAILOR wrote: Reason for CRM: 0.5MG /DOS, 2 MG/3ML NELMA [[id number]]  DISCONTINUED-Pt had visit on 01/06 and instructed by PCP Juberg was going to prescribe mediation again. Needs call back on update. [phone]

## 2024-11-14 ENCOUNTER — Ambulatory Visit: Payer: Self-pay | Admitting: Student

## 2024-11-14 LAB — SEDIMENTATION RATE: Sed Rate: 16 mm/h (ref 0–40)

## 2024-11-14 LAB — C-REACTIVE PROTEIN: CRP: 10 mg/L (ref 0–10)

## 2024-11-14 LAB — CK: Total CK: 79 U/L (ref 32–182)

## 2024-11-18 ENCOUNTER — Telehealth: Payer: Self-pay | Admitting: Physician Assistant

## 2024-11-18 ENCOUNTER — Other Ambulatory Visit: Payer: Self-pay

## 2024-11-18 DIAGNOSIS — F411 Generalized anxiety disorder: Secondary | ICD-10-CM

## 2024-11-18 MED ORDER — ALPRAZOLAM 1 MG PO TABS: 1.0000 mg | ORAL_TABLET | Freq: Two times a day (BID) | ORAL | 0 refills | Status: DC | PRN

## 2024-11-18 NOTE — Telephone Encounter (Signed)
Pended enough to appt.  

## 2024-11-18 NOTE — Telephone Encounter (Signed)
 [name] [name] called and said that she will need a refill on her xanax  1 mg. Her next appt is 12/03/24. Pharmacy is [facility] [address]

## 2024-11-19 ENCOUNTER — Telehealth: Payer: Self-pay | Admitting: *Deleted

## 2024-11-19 NOTE — Telephone Encounter (Signed)
 Call to patient states is having pain in both arms -elbow to shoulder pain. Is a stabbing pain.  States has been going on for a month.  Has taken Lyrica , Tramadol  and Extra Strength Tylenol  with no relief.  Would like something sent to her Pharmacy for her pain. Copied from CRM #[id number]. Topic: Clinical - Medication Question >> Nov 19, 2024  8:46 AM Susanna ORN wrote: Reason for CRM: Patient states she wants to know if the doctor can prescribe her something for pain. States she's been hurting in her arms. If so, please send to [facility] on [address] CB #: [phone].

## 2024-11-19 NOTE — Telephone Encounter (Signed)
 Copied from CRM #[id number]. Topic: Clinical - Lab/Test Results >> Nov 19, 2024  8:45 AM [name] [name] wrote: Reason for CRM: Patient calling to get lab results from the blood work she had for her arms last week. States no one has called her. Please give her a call back. CB #: [id number].

## 2024-11-20 ENCOUNTER — Other Ambulatory Visit: Payer: Self-pay | Admitting: Student

## 2024-11-20 DIAGNOSIS — M791 Myalgia, unspecified site: Secondary | ICD-10-CM

## 2024-11-20 MED ORDER — TIZANIDINE HCL 4 MG PO TABS: 4.0000 mg | ORAL_TABLET | Freq: Two times a day (BID) | ORAL | 1 refills | Status: AC | PRN

## 2024-12-03 ENCOUNTER — Ambulatory Visit: Admitting: Physician Assistant

## 2024-12-09 ENCOUNTER — Other Ambulatory Visit: Payer: Self-pay | Admitting: Physician Assistant

## 2024-12-09 DIAGNOSIS — F411 Generalized anxiety disorder: Secondary | ICD-10-CM

## 2024-12-24 ENCOUNTER — Ambulatory Visit: Admitting: Physician Assistant

## 2025-01-24 ENCOUNTER — Institutional Professional Consult (permissible substitution): Admitting: Diagnostic Neuroimaging

## 2025-03-05 ENCOUNTER — Ambulatory Visit
